# Patient Record
Sex: Female | Born: 1955 | Race: Black or African American | Hispanic: No | Smoking: Current every day smoker
Health system: Southern US, Community
[De-identification: ages and names within clinical notes are randomized; demographics above are authoritative.]

## PROBLEM LIST (undated history)

## (undated) ENCOUNTER — Emergency Department (HOSPITAL_COMMUNITY): Admission: EM | Payer: Self-pay | Source: Home / Self Care

## (undated) DIAGNOSIS — Z87442 Personal history of urinary calculi: Secondary | ICD-10-CM

## (undated) DIAGNOSIS — I82409 Acute embolism and thrombosis of unspecified deep veins of unspecified lower extremity: Secondary | ICD-10-CM

## (undated) DIAGNOSIS — K219 Gastro-esophageal reflux disease without esophagitis: Secondary | ICD-10-CM

## (undated) DIAGNOSIS — E785 Hyperlipidemia, unspecified: Secondary | ICD-10-CM

## (undated) DIAGNOSIS — K5900 Constipation, unspecified: Secondary | ICD-10-CM

## (undated) DIAGNOSIS — I10 Essential (primary) hypertension: Secondary | ICD-10-CM

## (undated) DIAGNOSIS — G8929 Other chronic pain: Secondary | ICD-10-CM

## (undated) DIAGNOSIS — M549 Dorsalgia, unspecified: Secondary | ICD-10-CM

## (undated) DIAGNOSIS — R519 Headache, unspecified: Secondary | ICD-10-CM

## (undated) DIAGNOSIS — J449 Chronic obstructive pulmonary disease, unspecified: Secondary | ICD-10-CM

## (undated) DIAGNOSIS — R51 Headache: Secondary | ICD-10-CM

## (undated) HISTORY — PX: CHOLECYSTECTOMY: SHX55

## (undated) HISTORY — PX: ABDOMINAL HYSTERECTOMY: SHX81

## (undated) HISTORY — PX: VASCULAR SURGERY: SHX849

---

## 2001-06-01 ENCOUNTER — Emergency Department (HOSPITAL_COMMUNITY): Admission: EM | Admit: 2001-06-01 | Discharge: 2001-06-01 | Payer: Self-pay | Admitting: *Deleted

## 2001-09-18 ENCOUNTER — Emergency Department (HOSPITAL_COMMUNITY): Admission: EM | Admit: 2001-09-18 | Discharge: 2001-09-18 | Payer: Self-pay | Admitting: Emergency Medicine

## 2002-01-26 ENCOUNTER — Emergency Department (HOSPITAL_COMMUNITY): Admission: EM | Admit: 2002-01-26 | Discharge: 2002-01-26 | Payer: Self-pay | Admitting: *Deleted

## 2002-03-29 ENCOUNTER — Emergency Department (HOSPITAL_COMMUNITY): Admission: EM | Admit: 2002-03-29 | Discharge: 2002-03-29 | Payer: Self-pay | Admitting: Internal Medicine

## 2003-05-10 ENCOUNTER — Encounter: Payer: Self-pay | Admitting: Emergency Medicine

## 2003-05-10 ENCOUNTER — Emergency Department (HOSPITAL_COMMUNITY): Admission: EM | Admit: 2003-05-10 | Discharge: 2003-05-10 | Payer: Self-pay | Admitting: Emergency Medicine

## 2003-05-29 ENCOUNTER — Ambulatory Visit (HOSPITAL_COMMUNITY): Admission: RE | Admit: 2003-05-29 | Discharge: 2003-05-29 | Payer: Self-pay | Admitting: General Surgery

## 2003-05-29 ENCOUNTER — Encounter: Payer: Self-pay | Admitting: General Surgery

## 2004-03-08 ENCOUNTER — Ambulatory Visit (HOSPITAL_COMMUNITY): Admission: RE | Admit: 2004-03-08 | Discharge: 2004-03-08 | Payer: Self-pay | Admitting: Family Medicine

## 2004-10-15 ENCOUNTER — Emergency Department (HOSPITAL_COMMUNITY): Admission: EM | Admit: 2004-10-15 | Discharge: 2004-10-15 | Payer: Self-pay | Admitting: Emergency Medicine

## 2005-01-31 ENCOUNTER — Emergency Department (HOSPITAL_COMMUNITY): Admission: EM | Admit: 2005-01-31 | Discharge: 2005-01-31 | Payer: Self-pay | Admitting: Emergency Medicine

## 2005-05-07 ENCOUNTER — Emergency Department (HOSPITAL_COMMUNITY): Admission: EM | Admit: 2005-05-07 | Discharge: 2005-05-07 | Payer: Self-pay | Admitting: Emergency Medicine

## 2005-09-13 ENCOUNTER — Emergency Department (HOSPITAL_COMMUNITY): Admission: EM | Admit: 2005-09-13 | Discharge: 2005-09-13 | Payer: Self-pay | Admitting: Emergency Medicine

## 2005-12-09 ENCOUNTER — Observation Stay (HOSPITAL_COMMUNITY): Admission: EM | Admit: 2005-12-09 | Discharge: 2005-12-10 | Payer: Self-pay | Admitting: Emergency Medicine

## 2005-12-18 HISTORY — PX: COLONOSCOPY: SHX174

## 2006-01-17 ENCOUNTER — Emergency Department (HOSPITAL_COMMUNITY): Admission: EM | Admit: 2006-01-17 | Discharge: 2006-01-17 | Payer: Self-pay | Admitting: Emergency Medicine

## 2006-02-05 ENCOUNTER — Emergency Department (HOSPITAL_COMMUNITY): Admission: EM | Admit: 2006-02-05 | Discharge: 2006-02-05 | Payer: Self-pay | Admitting: Emergency Medicine

## 2006-02-14 ENCOUNTER — Emergency Department (HOSPITAL_COMMUNITY): Admission: EM | Admit: 2006-02-14 | Discharge: 2006-02-14 | Payer: Self-pay | Admitting: Emergency Medicine

## 2006-05-15 ENCOUNTER — Emergency Department (HOSPITAL_COMMUNITY): Admission: EM | Admit: 2006-05-15 | Discharge: 2006-05-15 | Payer: Self-pay | Admitting: Emergency Medicine

## 2006-05-22 ENCOUNTER — Emergency Department (HOSPITAL_COMMUNITY): Admission: EM | Admit: 2006-05-22 | Discharge: 2006-05-22 | Payer: Self-pay | Admitting: Emergency Medicine

## 2006-06-14 ENCOUNTER — Emergency Department (HOSPITAL_COMMUNITY): Admission: EM | Admit: 2006-06-14 | Discharge: 2006-06-15 | Payer: Self-pay | Admitting: Emergency Medicine

## 2006-06-20 ENCOUNTER — Emergency Department (HOSPITAL_COMMUNITY): Admission: EM | Admit: 2006-06-20 | Discharge: 2006-06-20 | Payer: Self-pay | Admitting: Emergency Medicine

## 2006-07-26 ENCOUNTER — Encounter (HOSPITAL_COMMUNITY): Admission: RE | Admit: 2006-07-26 | Discharge: 2006-08-25 | Payer: Self-pay | Admitting: Family Medicine

## 2006-08-16 ENCOUNTER — Emergency Department (HOSPITAL_COMMUNITY): Admission: EM | Admit: 2006-08-16 | Discharge: 2006-08-16 | Payer: Self-pay | Admitting: Emergency Medicine

## 2006-09-06 ENCOUNTER — Ambulatory Visit: Payer: Self-pay | Admitting: Internal Medicine

## 2006-09-26 ENCOUNTER — Emergency Department (HOSPITAL_COMMUNITY): Admission: EM | Admit: 2006-09-26 | Discharge: 2006-09-26 | Payer: Self-pay | Admitting: Emergency Medicine

## 2006-10-18 ENCOUNTER — Ambulatory Visit: Payer: Self-pay | Admitting: Internal Medicine

## 2006-10-29 ENCOUNTER — Ambulatory Visit (HOSPITAL_COMMUNITY): Admission: RE | Admit: 2006-10-29 | Discharge: 2006-10-29 | Payer: Self-pay | Admitting: Internal Medicine

## 2006-10-29 ENCOUNTER — Ambulatory Visit: Payer: Self-pay | Admitting: Internal Medicine

## 2007-01-27 ENCOUNTER — Emergency Department (HOSPITAL_COMMUNITY): Admission: EM | Admit: 2007-01-27 | Discharge: 2007-01-27 | Payer: Self-pay | Admitting: Emergency Medicine

## 2007-03-28 ENCOUNTER — Emergency Department (HOSPITAL_COMMUNITY): Admission: EM | Admit: 2007-03-28 | Discharge: 2007-03-28 | Payer: Self-pay | Admitting: Emergency Medicine

## 2007-04-19 ENCOUNTER — Ambulatory Visit (HOSPITAL_COMMUNITY): Admission: RE | Admit: 2007-04-19 | Discharge: 2007-04-19 | Payer: Self-pay | Admitting: Family Medicine

## 2007-07-13 ENCOUNTER — Emergency Department (HOSPITAL_COMMUNITY): Admission: EM | Admit: 2007-07-13 | Discharge: 2007-07-13 | Payer: Self-pay | Admitting: Emergency Medicine

## 2007-09-02 ENCOUNTER — Emergency Department (HOSPITAL_COMMUNITY): Admission: EM | Admit: 2007-09-02 | Discharge: 2007-09-02 | Payer: Self-pay | Admitting: Emergency Medicine

## 2007-11-04 ENCOUNTER — Emergency Department (HOSPITAL_COMMUNITY): Admission: EM | Admit: 2007-11-04 | Discharge: 2007-11-04 | Payer: Self-pay | Admitting: Emergency Medicine

## 2008-01-05 ENCOUNTER — Emergency Department (HOSPITAL_COMMUNITY): Admission: EM | Admit: 2008-01-05 | Discharge: 2008-01-05 | Payer: Self-pay | Admitting: Emergency Medicine

## 2008-06-23 ENCOUNTER — Emergency Department (HOSPITAL_COMMUNITY): Admission: EM | Admit: 2008-06-23 | Discharge: 2008-06-23 | Payer: Self-pay | Admitting: Emergency Medicine

## 2008-09-30 ENCOUNTER — Emergency Department (HOSPITAL_COMMUNITY): Admission: EM | Admit: 2008-09-30 | Discharge: 2008-09-30 | Payer: Self-pay | Admitting: Emergency Medicine

## 2008-12-13 ENCOUNTER — Emergency Department (HOSPITAL_COMMUNITY): Admission: EM | Admit: 2008-12-13 | Discharge: 2008-12-13 | Payer: Self-pay | Admitting: Emergency Medicine

## 2009-01-24 ENCOUNTER — Emergency Department (HOSPITAL_COMMUNITY): Admission: EM | Admit: 2009-01-24 | Discharge: 2009-01-24 | Payer: Self-pay | Admitting: Emergency Medicine

## 2009-03-09 ENCOUNTER — Ambulatory Visit (HOSPITAL_COMMUNITY): Admission: RE | Admit: 2009-03-09 | Discharge: 2009-03-09 | Payer: Self-pay | Admitting: Family Medicine

## 2009-03-16 ENCOUNTER — Emergency Department (HOSPITAL_COMMUNITY): Admission: EM | Admit: 2009-03-16 | Discharge: 2009-03-16 | Payer: Self-pay | Admitting: Emergency Medicine

## 2009-07-04 ENCOUNTER — Emergency Department (HOSPITAL_COMMUNITY): Admission: EM | Admit: 2009-07-04 | Discharge: 2009-07-04 | Payer: Self-pay | Admitting: Emergency Medicine

## 2009-09-11 ENCOUNTER — Emergency Department (HOSPITAL_COMMUNITY): Admission: EM | Admit: 2009-09-11 | Discharge: 2009-09-11 | Payer: Self-pay | Admitting: Emergency Medicine

## 2010-04-24 ENCOUNTER — Emergency Department (HOSPITAL_COMMUNITY): Admission: EM | Admit: 2010-04-24 | Discharge: 2010-04-24 | Payer: Self-pay | Admitting: Emergency Medicine

## 2010-10-31 ENCOUNTER — Emergency Department (HOSPITAL_COMMUNITY)
Admission: EM | Admit: 2010-10-31 | Discharge: 2010-10-31 | Payer: Self-pay | Source: Home / Self Care | Admitting: Emergency Medicine

## 2011-01-07 ENCOUNTER — Encounter: Payer: Self-pay | Admitting: Family Medicine

## 2011-01-19 ENCOUNTER — Emergency Department (HOSPITAL_COMMUNITY)
Admission: EM | Admit: 2011-01-19 | Discharge: 2011-01-19 | Disposition: A | Discharge status: Home / Self Care | Payer: Medicare Other | Attending: Emergency Medicine | Admitting: Emergency Medicine

## 2011-01-19 DIAGNOSIS — N390 Urinary tract infection, site not specified: Secondary | ICD-10-CM | POA: Insufficient documentation

## 2011-01-19 DIAGNOSIS — R112 Nausea with vomiting, unspecified: Secondary | ICD-10-CM | POA: Insufficient documentation

## 2011-01-19 LAB — URINALYSIS, ROUTINE W REFLEX MICROSCOPIC
Bilirubin Urine: NEGATIVE
Hgb urine dipstick: NEGATIVE
Protein, ur: NEGATIVE mg/dL
Specific Gravity, Urine: 1.025 (ref 1.005–1.030)
Urine Glucose, Fasting: NEGATIVE mg/dL
Urobilinogen, UA: 0.2 mg/dL (ref 0.0–1.0)

## 2011-01-19 LAB — BASIC METABOLIC PANEL
Calcium: 9.3 mg/dL (ref 8.4–10.5)
Chloride: 106 mEq/L (ref 96–112)
GFR calc Af Amer: >60 mL/min (ref >60)
Potassium: 3.7 mEq/L (ref 3.5–5.1)
Sodium: 140 mEq/L (ref 135–145)

## 2011-01-19 LAB — DIFFERENTIAL
Basophils Absolute: 0.1 10*3/uL (ref 0.0–0.1)
Basophils Relative: 1 % (ref 0–1)
Eosinophils Absolute: 0.1 10*3/uL (ref 0.0–0.7)
Lymphocytes Relative: 50 % — ABNORMAL HIGH (ref 12–46)
Lymphs Abs: 3.4 10*3/uL (ref 0.7–4.0)
Monocytes Absolute: 0.6 10*3/uL (ref 0.1–1.0)
Neutro Abs: 2.7 10*3/uL (ref 1.7–7.7)

## 2011-01-19 LAB — URINE MICROSCOPIC-ADD ON

## 2011-01-19 LAB — CBC
Hemoglobin: 14.7 g/dL (ref 12.0–15.0)
MCHC: 33.7 g/dL (ref 30.0–36.0)
Platelets: 260 10*3/uL (ref 150–400)
RDW: 13.7 % (ref 11.5–15.5)

## 2011-02-09 ENCOUNTER — Emergency Department (HOSPITAL_COMMUNITY)
Admission: EM | Admit: 2011-02-09 | Discharge: 2011-02-09 | Disposition: A | Discharge status: Home / Self Care | Payer: Medicare Other | Attending: Emergency Medicine | Admitting: Emergency Medicine

## 2011-02-09 ENCOUNTER — Emergency Department (HOSPITAL_COMMUNITY): Payer: Medicare Other

## 2011-02-09 DIAGNOSIS — R509 Fever, unspecified: Secondary | ICD-10-CM | POA: Insufficient documentation

## 2011-02-09 DIAGNOSIS — R0602 Shortness of breath: Secondary | ICD-10-CM | POA: Insufficient documentation

## 2011-02-09 DIAGNOSIS — J40 Bronchitis, not specified as acute or chronic: Secondary | ICD-10-CM | POA: Insufficient documentation

## 2011-02-09 LAB — COMPREHENSIVE METABOLIC PANEL
ALT: 21 U/L (ref 0–35)
AST: 22 U/L (ref 0–37)
Albumin: 3.6 g/dL (ref 3.5–5.2)
CO2: 24 mEq/L (ref 19–32)
Calcium: 9.3 mg/dL (ref 8.4–10.5)
Creatinine, Ser: 1.06 mg/dL (ref 0.4–1.2)
GFR calc Af Amer: >60 mL/min (ref >60)
GFR calc non Af Amer: 54 mL/min — ABNORMAL LOW (ref >60)
Sodium: 139 mEq/L (ref 135–145)
Total Protein: 6.4 g/dL (ref 6.0–8.3)

## 2011-02-09 LAB — DIFFERENTIAL
Basophils Absolute: 0 10*3/uL (ref 0.0–0.1)
Basophils Relative: 0 % (ref 0–1)
Eosinophils Relative: 0 % (ref 0–5)
Lymphocytes Relative: 6 % — ABNORMAL LOW (ref 12–46)
Neutro Abs: 12.5 10*3/uL — ABNORMAL HIGH (ref 1.7–7.7)

## 2011-02-09 LAB — CBC
HCT: 42.8 % (ref 36.0–46.0)
MCHC: 32.7 g/dL (ref 30.0–36.0)
RDW: 13.8 % (ref 11.5–15.5)
WBC: 13.9 10*3/uL — ABNORMAL HIGH (ref 4.0–10.5)

## 2011-02-09 LAB — D-DIMER, QUANTITATIVE: D-Dimer, Quant: 0.23 ug/mL-FEU (ref 0.00–0.48)

## 2011-02-15 ENCOUNTER — Inpatient Hospital Stay (HOSPITAL_COMMUNITY): Payer: Medicare Other

## 2011-02-15 ENCOUNTER — Inpatient Hospital Stay (HOSPITAL_COMMUNITY)
Admission: RE | Admit: 2011-02-15 | Discharge: 2011-02-18 | DRG: 195 | Disposition: A | Discharge status: Home / Self Care | Payer: Medicare Other | Source: Ambulatory Visit | Attending: Internal Medicine | Admitting: Internal Medicine

## 2011-02-15 ENCOUNTER — Encounter (HOSPITAL_COMMUNITY): Payer: Self-pay | Admitting: Radiology

## 2011-02-15 ENCOUNTER — Emergency Department (HOSPITAL_COMMUNITY): Payer: Medicare Other

## 2011-02-15 DIAGNOSIS — R071 Chest pain on breathing: Secondary | ICD-10-CM | POA: Diagnosis present

## 2011-02-15 DIAGNOSIS — R918 Other nonspecific abnormal finding of lung field: Secondary | ICD-10-CM | POA: Diagnosis present

## 2011-02-15 DIAGNOSIS — E876 Hypokalemia: Secondary | ICD-10-CM | POA: Diagnosis present

## 2011-02-15 DIAGNOSIS — J189 Pneumonia, unspecified organism: Principal | ICD-10-CM | POA: Diagnosis present

## 2011-02-15 DIAGNOSIS — D72829 Elevated white blood cell count, unspecified: Secondary | ICD-10-CM | POA: Diagnosis present

## 2011-02-15 DIAGNOSIS — F17200 Nicotine dependence, unspecified, uncomplicated: Secondary | ICD-10-CM | POA: Diagnosis present

## 2011-02-15 LAB — DIFFERENTIAL
Eosinophils Absolute: 0.1 10*3/uL (ref 0.0–0.7)
Eosinophils Relative: 0 % (ref 0–5)
Lymphocytes Relative: 18 % (ref 12–46)
Lymphs Abs: 2.4 10*3/uL (ref 0.7–4.0)
Monocytes Absolute: 0.2 10*3/uL (ref 0.1–1.0)
Monocytes Relative: 2 % — ABNORMAL LOW (ref 3–12)

## 2011-02-15 LAB — BASIC METABOLIC PANEL
BUN: 7 mg/dL (ref 6–23)
CO2: 26 mEq/L (ref 19–32)
Chloride: 108 mEq/L (ref 96–112)
Potassium: 3.3 mEq/L — ABNORMAL LOW (ref 3.5–5.1)

## 2011-02-15 LAB — BLOOD GAS, ARTERIAL
Acid-base deficit: 2.3 mmol/L — ABNORMAL HIGH (ref 0.0–2.0)
Bicarbonate: 21.1 mEq/L (ref 20.0–24.0)
Patient temperature: 37
TCO2: 18.5 mmol/L (ref 0–100)
pH, Arterial: 7.448 — ABNORMAL HIGH (ref 7.350–7.400)

## 2011-02-15 LAB — HEPATIC FUNCTION PANEL
ALT: 15 U/L (ref 0–35)
Albumin: 3.4 g/dL — ABNORMAL LOW (ref 3.5–5.2)
Alkaline Phosphatase: 80 U/L (ref 39–117)
Indirect Bilirubin: 0.3 mg/dL (ref 0.3–0.9)
Total Bilirubin: 0.4 mg/dL (ref 0.3–1.2)
Total Protein: 6.4 g/dL (ref 6.0–8.3)

## 2011-02-15 LAB — CBC
HCT: 43.6 % (ref 36.0–46.0)
MCH: 28.3 pg (ref 26.0–34.0)
MCHC: 32.6 g/dL (ref 30.0–36.0)
MCV: 86.9 fL (ref 78.0–100.0)
Platelets: 255 10*3/uL (ref 150–400)
RDW: 13.9 % (ref 11.5–15.5)
WBC: 13.3 10*3/uL — ABNORMAL HIGH (ref 4.0–10.5)

## 2011-02-15 LAB — CARDIAC PANEL(CRET KIN+CKTOT+MB+TROPI)
CK, MB: 0.8 ng/mL (ref 0.3–4.0)
CK, MB: 0.6 ng/mL (ref 0.3–4.0)
Relative Index: INVALID (ref 0.0–2.5)
Total CK: 62 U/L (ref 7–177)

## 2011-02-15 LAB — BRAIN NATRIURETIC PEPTIDE: Pro B Natriuretic peptide (BNP): <30 pg/mL (ref 0.0–100.0)

## 2011-02-15 MED ORDER — IOHEXOL 350 MG/ML SOLN
100.0000 mL | Freq: Once | INTRAVENOUS | Status: AC | PRN
  Administered 2011-02-15: 100 mL via INTRAVENOUS

## 2011-02-16 LAB — CBC
HCT: 34.1 % — ABNORMAL LOW (ref 36.0–46.0)
Hemoglobin: 11.1 g/dL — ABNORMAL LOW (ref 12.0–15.0)
MCH: 28.7 pg (ref 26.0–34.0)
MCV: 88.1 fL (ref 78.0–100.0)
RBC: 3.87 MIL/uL (ref 3.87–5.11)

## 2011-02-16 LAB — DIFFERENTIAL
Lymphocytes Relative: 8 % — ABNORMAL LOW (ref 12–46)
Lymphs Abs: 1.6 10*3/uL (ref 0.7–4.0)
Monocytes Absolute: 0.7 10*3/uL (ref 0.1–1.0)
Monocytes Relative: 4 % (ref 3–12)
Neutro Abs: 17.9 10*3/uL — ABNORMAL HIGH (ref 1.7–7.7)
Neutrophils Relative %: 87 % — ABNORMAL HIGH (ref 43–77)

## 2011-02-16 LAB — VANCOMYCIN, TROUGH: Vancomycin Tr: 10.1 ug/mL (ref 10.0–20.0)

## 2011-02-16 LAB — COMPREHENSIVE METABOLIC PANEL
Albumin: 2.9 g/dL — ABNORMAL LOW (ref 3.5–5.2)
Alkaline Phosphatase: 71 U/L (ref 39–117)
BUN: 8 mg/dL (ref 6–23)
CO2: 23 mEq/L (ref 19–32)
Chloride: 111 mEq/L (ref 96–112)
GFR calc non Af Amer: >60 mL/min (ref >60)
Glucose, Bld: 132 mg/dL — ABNORMAL HIGH (ref 70–99)
Potassium: 4.6 mEq/L (ref 3.5–5.1)
Total Bilirubin: 0.4 mg/dL (ref 0.3–1.2)

## 2011-02-17 LAB — BASIC METABOLIC PANEL
CO2: 25 mEq/L (ref 19–32)
Chloride: 110 mEq/L (ref 96–112)
GFR calc Af Amer: >60 mL/min (ref >60)
Glucose, Bld: 104 mg/dL — ABNORMAL HIGH (ref 70–99)
Potassium: 4.2 mEq/L (ref 3.5–5.1)
Sodium: 142 mEq/L (ref 135–145)

## 2011-02-17 LAB — CBC
MCH: 28.2 pg (ref 26.0–34.0)
MCHC: 31.9 g/dL (ref 30.0–36.0)
Platelets: 255 10*3/uL (ref 150–400)
RBC: 4.4 MIL/uL (ref 3.87–5.11)

## 2011-02-17 LAB — DIFFERENTIAL
Basophils Relative: 0 % (ref 0–1)
Eosinophils Absolute: 1.5 10*3/uL — ABNORMAL HIGH (ref 0.0–0.7)
Monocytes Relative: 5 % (ref 3–12)
Neutrophils Relative %: 62 % (ref 43–77)

## 2011-02-18 LAB — DIFFERENTIAL
Basophils Absolute: 0 10*3/uL (ref 0.0–0.1)
Lymphocytes Relative: 31 % (ref 12–46)
Monocytes Absolute: 0.5 10*3/uL (ref 0.1–1.0)
Monocytes Relative: 5 % (ref 3–12)
Neutro Abs: 3.9 10*3/uL (ref 1.7–7.7)
Neutrophils Relative %: 41 % — ABNORMAL LOW (ref 43–77)

## 2011-02-18 LAB — CBC
HCT: 42.7 % (ref 36.0–46.0)
Hemoglobin: 13.5 g/dL (ref 12.0–15.0)
MCH: 27.4 pg (ref 26.0–34.0)
MCHC: 31.6 g/dL (ref 30.0–36.0)
RBC: 4.93 MIL/uL (ref 3.87–5.11)

## 2011-02-28 NOTE — Discharge Summary (Signed)
[place], [place] Washington             ACCOUNT NO.:  [id number]  MEDICAL RECORD NO.:  [id number]           PATIENT TYPE:  I  LOCATION:  A327                          FACILITY:  [facility]  PHYSICIAN:  Hartley Barefoot, MD    DATE OF BIRTH:  [birth year]  DATE OF ADMISSION:  02/15/2011 DATE OF DISCHARGE:  03/03/2012LH                              DISCHARGE SUMMARY   DISCHARGE DIAGNOSES: 1. Community-acquired pneumonia. 2. Pleuritic chest pain secondary to pneumonia. 3. Tobacco abuse. 4. A small nodular foci in the right lung, the largest of which     measure 6 mm.  DISCHARGE MEDICATIONS: 1. Albuterol 90 mcg every 6 hours as needed. 2. Guaifenesin 600 mg twice daily as needed. 3. Levofloxacin 750 mg p.o. daily. 4. Percocet, oxycodone, Tylenol 1-2 tablets every 6 hours as needed. 5. Promethazine 25 mg half tablet by mouth every 4 hours as needed.  Medications that stopped during this hospitalization, azithromycin and nitrofurantoin.  DISPOSITION AND FOLLOWUP:  Virginia Washington will need to follow with her primary care physician Dr. Milford Cage and during that appointment she will need to be refer for a pulmonary function test.  She will need a repeat CT chest within 3 months to follow lung nodules. She will need a CBC to follow white blood cell and differential.  RADIOGRAPHIC STUDY: 1. Chest x-ray showed patchy left basilar airspace opacity, concerning     for pneumonia. 2. CT angio showed no evidence of pulmonary embolism.  Bibasilar     atelectasis.  A small nodular foci identified in the right lung,     largest of which measured 6 mm in diameter.  These nodules are     poorly defined and may represent nonsolid opacity, potentially     infection but cannot exclude tumor.  Recommend short-term followup     of chest in 3 months to reassess.  BRIEF HISTORY OF PRESENT ILLNESS:  This is a very pleasant 55 year old with no significant past medical history who presented complaining of chest pain,  shortness of breath, productive cough, subjective fever. The patient presented on February 23, 5 days prior to admission with similar complaint and was discharged on Zithromax.  The patient despite taking antibiotics was still having the same symptoms.  The chest pain is worse with breathing.  Denies abdominal pain, dysuria, diarrhea.  HOSPITAL COURSE: 1. Community-acquired pneumonia that failed outpatient therapy.  The     patient was started on vancomycin and Levaquin.  The patient was on     vancomycin for 24-48 hours.  After that she was continued on     Levaquin and she was observed on oral antibiotics and her white     blood cell was trending down.  Initially, she received a dose of     Solu-Medrol and her white blood cell increased from 13-20 but then     after the course of hospitalization, decreased to 14 and then to     9.5 on the day of discharge.  The patient's shortness of breath and     chest pain has resolved.  Chest pain was likely secondary to  pneumonia.  CT angio was negative for PE.  The patient will be     discharged on levofloxacin for 4 more days.  She will be discharged     on albuterol.  She will benefit from pulmonary function test.  She     has history of current smoking and risk for COPD.  On the day of     discharge, the patient was feeling better.  No chest pain or     shortness of breath. 2. Lung nodule on CT.  The patient will need a CT in 3 months to rule     out malignancy. 3. Tobacco abuse.  The patient was counseled regarding tobacco     cessation. 4. On the day of discharge the patient was in improved condition.  No     chest pain, shortness of breath, sat 98 on room air, blood pressure     100/65, respirations 18, pulse 68, temperature 98.2.  DISCHARGE LABORATORY FINDINGS:  White blood cell 9.5, hemoglobin 13, hematocrit 42, platelets 311.  Sodium the day prior to discharge 142, potassium 4.2, chloride 110, CO2 25, glucose 104, BUN 8,  creatinine 0.95, calcium 8.9.  The patient was discharged in improved condition.     Hartley Barefoot, MD     BR/MEDQ  D:  02/18/2011  T:  02/19/2011  Job:  [id number]  cc:   Dr. Milford Cage  Electronically Signed by Hartley Barefoot MD on 02/28/2011 08:29:08 AM

## 2011-03-20 NOTE — H&P (Signed)
[place], [place] Washington             ACCOUNT NO.:  [id number]  MEDICAL RECORD NO.:  [id number]           PATIENT TYPE:  E  LOCATION:  APED                          FACILITY:  [facility]  PHYSICIAN:  Eduard Clos, MDDATE OF BIRTH:  1956-06-23  DATE OF ADMISSION:  02/15/2011 DATE OF DISCHARGE:  LH                             HISTORY & PHYSICAL   PRIMARY CARE PHYSICIAN:  Unassigned.  CHIEF COMPLAINT:  Chest pain and shortness of breath.  HISTORY OF PRESENT ILLNESS:  A 55 year old female with no significant past medical history presented with complaints of chest pain, shortness of breath, cough with productive sputum, subjective feeling of fever and chills.  The patient did come on February 23, 5 days ago with similar complaints and the patient was given Zithromax and discharged home. Despite taking which, the patient still having similar symptoms.  It is not known if the patient completed the current medications.  The patient states it is only the right side and only when she takes a deep breath. Denies any nausea or vomiting.  Denies any exertional symptoms.  Denies any abdominal pain, dysuria, discharge, or diarrhea.  Denies any focal deficit, headache, or visual symptoms.  Denies any dizziness or loss of consciousness.  PAST MEDICAL HISTORY:  Nothing significant.  PAST SURGICAL HISTORY:  Hysterectomy and appendectomy.  MEDICATIONS PRIOR TO ADMISSION:  Zithromax.  ALLERGIES:  No known drug allergies.  FAMILY HISTORY:  Negative for any diabetes, hypertension, stroke.  SOCIAL HISTORY:  The patient smokes cigarettes, has been advised to quit smoking.  Denies any alcohol or drug abuse.  REVIEW OF SYSTEMS:  As per history of present illness, nothing else significant.  PHYSICAL EXAMINATION:  GENERAL:  The patient examined at bedside, appears mildly short of breath. VITAL SIGNS:  Blood pressure 105/81, pulse 90 per minute, temperature 98.7, respirations 20-24 per minute, and  O2 saturation is 96%. HEENT:  Anicteric.  No pallor.  No discharge from ears, eyes, nose, or mouth. CHEST:  Bilateral air entry present.  No rhonchi.  No crepitation. HEART:  S1 and S2 heard. ABDOMEN:  Soft and nontender.  Bowel sounds heard. CNS:  The patient is alert, awake, and oriented to time, place, and person.  Moves upper and lower extremities 5/5: EXTREMITIES:  Peripheral pulses felt.  No edema.  LABORATORY DATA:  EKG has been ordered.  Chest x-ray shows patchy left basilar airspace opacities concerning for pneumonia.  CBC:  WBC 13.3, hemoglobin is 14.2, hematocrit 43.6, platelets 255, neutrophils 80%.  Basic metabolic panel:  Sodium 141, potassium 3.6, chloride 108, carbon dioxide 26, glucose 141, BUN 7, creatinine 1, and calcium 9.3.  ASSESSMENT: 1. Pneumonia, community-acquired. 2. Pleuritic type of chest pain. 3. Tobacco abuse.  PLAN: 1. At this time, admit the patient to telemetry. 2. For pneumonia, the patient has failed outpatient antibiotics.  At     this time, we will place the patient on vancomycin and Levaquin.     Also concerned at this time as the patient has a history of     cigarette smoking, we will get a CT of chest angio protocol. 3. The patient will  need tobacco abuse counseling. 4. Further recommendation based on test orders.     Eduard Clos, MD     ANK/MEDQ  D:  02/15/2011  T:  02/15/2011  Job:  [id number]  Electronically Signed by Midge Minium MD on 03/20/2011 07:21:12 AM

## 2011-03-24 LAB — URINALYSIS, ROUTINE W REFLEX MICROSCOPIC
Glucose, UA: NEGATIVE mg/dL
Hgb urine dipstick: NEGATIVE
Protein, ur: NEGATIVE mg/dL
pH: 7 (ref 5.0–8.0)

## 2011-03-24 LAB — URINE MICROSCOPIC-ADD ON

## 2011-03-26 LAB — DIFFERENTIAL
Basophils Absolute: 0 10*3/uL (ref 0.0–0.1)
Basophils Relative: 0 % (ref 0–1)
Eosinophils Absolute: 0 10*3/uL (ref 0.0–0.7)
Eosinophils Relative: 0 % (ref 0–5)
Monocytes Absolute: 0.5 10*3/uL (ref 0.1–1.0)
Monocytes Relative: 5 % (ref 3–12)
Neutro Abs: 8.2 10*3/uL — ABNORMAL HIGH (ref 1.7–7.7)

## 2011-03-26 LAB — COMPREHENSIVE METABOLIC PANEL
ALT: 11 U/L (ref 0–35)
AST: 20 U/L (ref 0–37)
Albumin: 3.9 g/dL (ref 3.5–5.2)
Alkaline Phosphatase: 89 U/L (ref 39–117)
BUN: 8 mg/dL (ref 6–23)
Chloride: 108 mEq/L (ref 96–112)
GFR calc Af Amer: >60 mL/min (ref >60)
Potassium: 3.5 mEq/L (ref 3.5–5.1)
Sodium: 140 mEq/L (ref 135–145)
Total Bilirubin: 0.5 mg/dL (ref 0.3–1.2)
Total Protein: 6.9 g/dL (ref 6.0–8.3)

## 2011-03-26 LAB — URINALYSIS, ROUTINE W REFLEX MICROSCOPIC
Bilirubin Urine: NEGATIVE
Hgb urine dipstick: NEGATIVE
Specific Gravity, Urine: <1.005 — ABNORMAL LOW (ref 1.005–1.030)
Urobilinogen, UA: 0.2 mg/dL (ref 0.0–1.0)
pH: 6 (ref 5.0–8.0)

## 2011-03-26 LAB — CBC
HCT: 42 % (ref 36.0–46.0)
Platelets: 204 10*3/uL (ref 150–400)
RDW: 13.9 % (ref 11.5–15.5)
WBC: 10.1 10*3/uL (ref 4.0–10.5)

## 2011-03-26 LAB — RAPID URINE DRUG SCREEN, HOSP PERFORMED
Amphetamines: NOT DETECTED
Barbiturates: NOT DETECTED
Cocaine: NOT DETECTED
Opiates: NOT DETECTED

## 2011-03-26 LAB — POCT CARDIAC MARKERS: CKMB, poc: <1 ng/mL — ABNORMAL LOW (ref 1.0–8.0)

## 2011-04-04 LAB — BASIC METABOLIC PANEL
BUN: 10 mg/dL (ref 6–23)
Chloride: 106 mEq/L (ref 96–112)
Creatinine, Ser: 0.95 mg/dL (ref 0.4–1.2)
Glucose, Bld: 102 mg/dL — ABNORMAL HIGH (ref 70–99)
Potassium: 3.7 mEq/L (ref 3.5–5.1)

## 2011-04-04 LAB — URINALYSIS, ROUTINE W REFLEX MICROSCOPIC
Ketones, ur: NEGATIVE mg/dL
Nitrite: NEGATIVE
Protein, ur: NEGATIVE mg/dL
Urobilinogen, UA: 0.2 mg/dL (ref 0.0–1.0)

## 2011-04-04 LAB — DIFFERENTIAL
Basophils Absolute: 0 10*3/uL (ref 0.0–0.1)
Basophils Relative: 0 % (ref 0–1)
Eosinophils Absolute: 0 10*3/uL (ref 0.0–0.7)
Eosinophils Relative: 0 % (ref 0–5)
Neutrophils Relative %: 86 % — ABNORMAL HIGH (ref 43–77)

## 2011-04-04 LAB — CBC
HCT: 44.4 % (ref 36.0–46.0)
MCV: 86.3 fL (ref 78.0–100.0)
Platelets: 214 10*3/uL (ref 150–400)
RDW: 13.6 % (ref 11.5–15.5)

## 2011-05-05 ENCOUNTER — Emergency Department (HOSPITAL_COMMUNITY): Payer: Medicare Other

## 2011-05-05 ENCOUNTER — Emergency Department (HOSPITAL_COMMUNITY)
Admission: EM | Admit: 2011-05-05 | Discharge: 2011-05-05 | Disposition: A | Discharge status: Home / Self Care | Payer: Medicare Other | Attending: Emergency Medicine | Admitting: Emergency Medicine

## 2011-05-05 DIAGNOSIS — W108XXA Fall (on) (from) other stairs and steps, initial encounter: Secondary | ICD-10-CM | POA: Insufficient documentation

## 2011-05-05 DIAGNOSIS — S93409A Sprain of unspecified ligament of unspecified ankle, initial encounter: Secondary | ICD-10-CM | POA: Insufficient documentation

## 2011-05-05 NOTE — Consult Note (Signed)
[place], [place] Washington             ACCOUNT NO.:  [id number]   MEDICAL RECORD NO.:  [id number]          PATIENT TYPE:  AMB   LOCATION:                                FACILITY:  [facility]   PHYSICIAN:  Lionel December, M.D.    DATE OF BIRTH:  08-16-1956   DATE OF CONSULTATION:  10/18/2006  DATE OF DISCHARGE:                                   CONSULTATION   REASON FOR CONSULTATION:  Hematochezia referred for colonoscopy.   HISTORY OF PRESENT ILLNESS:  Virginia Washington is a 55 year old African American  female who is referred through courtesy of Jeoffrey Massed, M.D. for GI  evaluation.  About 2 months ago she had an episode of lower abdominal pain  and hematochezia.  She believes she was constipated.  She was seen in  emergency room and her CBC was normal.  She followed up with Dr. Milinda Cave.  She was started on stool softener.  She is now having normal stools.  She  was advised colonoscopy.  She has not experienced any more episodes of  constipation or abdominal pain.  She has a good appetite.  She denies  nausea, vomiting, heartburn or dysphagia.  She states she has gained some  weight this year which she blames on hormonal pill.   PRESENT MEDS:  1. Hormone pill daily specifics unknown.  2. Colace 2 tablets bedtime.   PAST MEDICAL HISTORY:  She was evaluated for weight loss and diarrhea back  in March 2001.  Her sigmoidoscopy was normal.  She had three Hemoccults  which were all negative.  We felt she had IBS. Her CBC and sed rate were  normal.   At that time she weighed 107 pounds.   PRIOR SURGERIES:  Include cholecystectomy and hysterectomy possibly 1997 or  98.   Remote history of peptic ulcer disease, H pylori status is unknown.   ALLERGIES:  No known drug allergies.   FAMILY HISTORY:  Father died of unknown type of malignancy at age 63.  She  has a mother age 66 with breathing problems.  One sister died of auto  accident at age 52.  She has seven sisters and four brothers living  in  good health.   SOCIAL HISTORY:  She is a widow.  Husband died a few years ago of auto  accident.  She has two children living.  One son was short and died at age  61.  She is on disability because of learning disabilities. She has been  smoking half to one pack a day for 30 years presently smoking half a pack.  She does not drink alcohol.   PHYSICAL EXAM:  GENERAL:  Pleasant well-developed, well-nourished of her  Afro American female who is in no acute distress.  VITAL SIGNS:  She weighs 130 pounds.  She is 5 feet 1 inch tall.  Pulse 60  per minute, blood pressure 124/70, temperature is 98.6.  HEENT:  Conjunctivae is pink.  Sclerae is nonicteric.  Oropharynx mucosa is  normal.  She has one tooth in lower jaw.  She states she has dentures but  there do  not fit well and she does not use them.  NECK:  No neck masses or thyromegaly noted.  HEART:  Regular rhythm.  Normal S1-S3.  No murmur or gallop noted.  LUNGS:  Clear to auscultation.  ABDOMEN:  Symmetrical and soft without tenderness, organomegaly or masses.  RECTAL:  Examination reveals formed stool in the vault which is guaiac-  negative.  No peripheral edema or clubbing noted.   ASSESSMENT:  Virginia Washington is a 55 year old Afro American female who had single  episode of hematochezia associated with lower abdominal pain but in the  setting of constipation.  Therefore it is possible that she had hematochezia  secondary to benign disease but given her age (she is approaching 75s) need  to rule out colonic neoplasm.   RECOMMENDATIONS:  She will continue Colace and high fiber diet.  Colonoscopy  to be performed at [facility] in the near future.  I have reviewed the procedure  risks with the patient and she is agreeable.   ADDENDUM:  Records through Dr. Samul Dada office dated August 18, 2006, she  had negative Clostridium difficile, negative culture with fecal occult blood  was positive.   We would like to thank Dr. Milinda Cave for the  opportunity to participate in the  care of this nice lady.      Lionel December, M.D.  Electronically Signed     NR/MEDQ  D:  10/18/2006  T:  10/19/2006  Job:  [id number]   cc:   Jeoffrey Massed, MD  Fax: [phone]

## 2011-05-05 NOTE — H&P (Signed)
NAMEANNALIA, Virginia Washington             ACCOUNT NO.:  [id number]   MEDICAL RECORD NO.:  [id number]          PATIENT TYPE:  OBV   LOCATION:  A228                          FACILITY:  [facility]   PHYSICIAN:  Osvaldo Shipper, MD     DATE OF BIRTH:  April 12, 1956   DATE OF ADMISSION:  12/09/2005  DATE OF DISCHARGE:  LH                                HISTORY & PHYSICAL   PRIMARY CARE PHYSICIAN:  Dr. Elpidio Anis.   ADMISSION DIAGNOSES:  1.  Chest pain.  2.  Tobacco use.   CHIEF COMPLAINT:  Left-sided chest pain for one day.   HISTORY OF PRESENT ILLNESS:  The patient is a 55 year old African-American  female who is status post hysterectomy about six to seven years ago who  smokes half a pack of cigarettes a day and otherwise does not have any other  medical problems and presented to the ED with left-sided chest pain.  The  pain started yesterday evening when the patient was resting.  The pain was  sharp in character and about 8/10 in intensity.  The pain was on and off.  There was no shortness of breath or palpitations.  Initially, the patient  felt cold.  Subsequently, she felt hot and flushed.  There was some nausea  associated with the pain but no emesis.  No history of cough, fever, or  chills.  No history of any sick contacts recently.   The patient has very occasional heartburn.  Initially when the pain started,  it did remind her of heartburn.  However, with her usual heartburn, she  takes Alka-Seltzer and gets relief.  This time, she did not get any relief.  The pain did not increase with activity.  This morning when she woke up, she  still had the pain and decided to come in to the ED.  In the ED, the patient  was given one sublingual nitroglycerin, with which the patient has complete  relief of her pain.  Currently, the pain is 0/10.   The patient also mentioned she has never had this kind of pain in the past.   MEDICATIONS AT HOME:  The patient takes hormone replacement pills.  She  does  not know the name of this medication.   ALLERGIES:  No known drug allergies.   PAST MEDICAL HISTORY:  Really unremarkable except surgical history consists  of hysterectomy about six to seven years ago for bleeding done by Dr. Katrinka Blazing  and history of cholecystectomy done about the same time.  No history of any  coronary artery disease, diabetes, hypertension, dyslipidemia, or strokes in  the past.   SOCIAL HISTORY:  The patient lives in [place] and lives alone.  She is a  widow.  She has two sons and one daughter.  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  She smokes about a half a pack of cigarettes per  day.  She has about a 15-pack-year history of smoking.  No history of  illicit drug use.  No alcohol use.   FAMILY HISTORY:  Father died at the age of 31 of unknown  cause.  She thinks  it may have been a stroke, but she is not sure.  Mother is alive with asthma  and had a stroke as well.  No history of heart disease in any of the other  family members.  No history of cancers, no history of any clot formation.   REVIEW OF SYSTEMS:  A 10-point review of systems was done which is positive  for constipation but no weight changes.  No history of any leg swelling as  well.   PHYSICAL EXAMINATION:  VITAL SIGNS:  The patient is afebrile.  Blood  pressure is 123/80, heart rate in the 60s, respiratory rate 14, saturating  99 to 100%.  Currently, she is on 2 L nasal cannula.  GENERAL EXAM:  This is a well-developed, well-nourished individual in no  apparent distress.  HEENT:  There is no pattern of icterus.  Oral and mucous membranes moist.  NECK:  Soft and supple with no thyromegaly or any masses appreciated.  RESPIRATORY:  Lungs are clear to auscultation bilaterally with no rales,  rhonchi, or wheezes.  CARDIOVASCULAR:  S1, S2 is normal and regular.  No S3 or S4 and no murmurs  and no rubs appreciated.  No carotid bruit heard.  ABDOMEN:  Abdomen is soft and  nondistended.  Bowel sounds are present in all  quadrants.  No masses or organomegaly appreciated.  Mild epigastric  tenderness is appreciated at this time.  No rebound, no rigidity, no  guarding.  EXTREMITIES:  Extremities are without edema.  Peripheral pulses are  palpable.  No calf tenderness present.  NEUROLOGIC:  The patient is alert and oriented x3 with no gross neurologic  deficits appreciated.   LABORATORY DATA:  CBC, CMP completely unremarkable.  Two separate cardiac  enzymes done within one hour completely unremarkable.  No other lab studies  available.   Chest X-ray does not reveal any acute abnormality.   EKG reveals sinus rhythm with a normal axis.  Intervals appear to be within  the normal range.  I do not appreciate any Q waves.  There might be some  early repolarization.  Some of these ST segments in the inferior and lateral  leads suggest this.  Subsequent EKG also shows similar changes.   IMPRESSION:  This is a 55 year old African-American female with no  significant past medical history.  Risk factors include cigarette smoking  and early menopause.  He presents to the ED with left-sided chest pain,  sharp in nature.  Differential diagnosis includes acute coronary syndrome.  The patient will be admitted to the hospital to undergo serial cardiac  enzymes.  Her EKG shows some early repolarization changes.  PE is another  etiology for her chest pain.  The patient is on hormone pills which  certainly is a risk factor.  The character of the pain also is somewhat  suggestive.  Other differential include acid reflux disease which will also  be entertained.  Other esophageal problems also to be entertained as the  patient has had relief of pain with nitroglycerin.  Pericarditis is another  differential to consider.   PLAN:  1.  Chest pain.  We will observe the patient in the hospital through serial     cardiac enzymes.  We will do serial EKGs.  We will also check a  D-dimer      and ESR.  Lipid profile will be checked in the morning.  We will start      the patient on Protonix.  We will give the patient baby aspirin.  We      will withhold beta blockers and anticoagulation at this time as they are      not truly indicated.   1.  Smoking cessation.  Counseling has been done on this patient.   Further management decisions will be based on results of initial testing and  the patient's response to treatment.      Osvaldo Shipper, MD  Electronically Signed     GK/MEDQ  D:  12/09/2005  T:  12/09/2005  Job:  [id number]   cc:   Dirk Dress. Katrinka Blazing, M.D.  Fax: [phone]

## 2011-05-05 NOTE — Op Note (Signed)
NAMEBRION, HEDGES             ACCOUNT NO.:  [id number]   MEDICAL RECORD NO.:  [id number]          PATIENT TYPE:  AMB   LOCATION:  DAY                           FACILITY:  [facility]   PHYSICIAN:  Lionel December, M.D.    DATE OF BIRTH:  02-08-1956   DATE OF PROCEDURE:  10/29/2006  DATE OF DISCHARGE:                                 OPERATIVE REPORT   PROCEDURE:  Colonoscopy.   INDICATIONS:  Virginia Washington is a 55 year old African American female who had an  episode of hematochezia and LLQ abdominal pain.  She is undergoing  diagnostic exam.  Her symptoms have completely resolved.  Procedure and  risks were reviewed with the patient and informed consent was obtained.   MEDICATIONS FOR CONSCIOUS SEDATION:  Demerol 50 mg IV, Versed 6 mg IV.   FINDINGS:  Procedure performed in endoscopy suite.  The patient's vital  signs and O2 sat were monitored during the procedure and remained stable.  The patient was placed in the left lateral position and rectal examination  performed.  No abnormality noted on external or digital exam.  Olympus video  scope was placed in the rectum and advanced under vision into sigmoid colon  and beyond.  Her prep was suboptimal.  She had pieces of scattered stool  throughout the colon but more so in the proximal half.  Nevertheless, the  scope was passed in the cecum which was identified by ileocecal valve and  part of the blind cecum was seen and no abnormality noted.  In the other  part, the stool could not be washed away.  As the scope was withdrawn, the  colonic mucosa was examined for the second time, and there were no polyps,  tumor masses or changes of colitis.  Rectal mucosa similarly was normal.  The scope was retroflexed to examine the anorectal junction, and small  hemorrhoids were noted proximal to the dentate line.  Endoscope was  straightened and withdrawn.  The patient tolerated the procedure well.   FINAL DIAGNOSIS:  Small internal hemorrhoids, otherwise  normal colonoscopy.  However, exam somewhat compromised given quality of prep.   RECOMMENDATIONS:  She will continue high fiber diet and Colace as before.  We would also like her to take Citrucel of equivalent of one tablespoonful  daily.  Should she experience rectal bleeding again, she will get in touch  with [place].      Lionel December, M.D.  Electronically Signed    NR/MEDQ  D:  10/29/2006  T:  10/29/2006  Job:  [id number]   cc:   Jeoffrey Massed, MD  Fax: [phone]

## 2011-07-11 ENCOUNTER — Encounter (HOSPITAL_COMMUNITY): Payer: Self-pay

## 2011-07-11 ENCOUNTER — Emergency Department (HOSPITAL_COMMUNITY)
Admission: EM | Admit: 2011-07-11 | Discharge: 2011-07-11 | Disposition: A | Discharge status: Home / Self Care | Payer: Medicare Other | Attending: Emergency Medicine | Admitting: Emergency Medicine

## 2011-07-11 ENCOUNTER — Emergency Department (HOSPITAL_COMMUNITY): Payer: Medicare Other

## 2011-07-11 DIAGNOSIS — F17200 Nicotine dependence, unspecified, uncomplicated: Secondary | ICD-10-CM | POA: Insufficient documentation

## 2011-07-11 DIAGNOSIS — J40 Bronchitis, not specified as acute or chronic: Secondary | ICD-10-CM | POA: Insufficient documentation

## 2011-07-11 LAB — CBC
HCT: 43.9 % (ref 36.0–46.0)
MCHC: 32.1 g/dL (ref 30.0–36.0)
Platelets: 242 10*3/uL (ref 150–400)
RDW: 13.5 % (ref 11.5–15.5)

## 2011-07-11 LAB — BASIC METABOLIC PANEL
BUN: 11 mg/dL (ref 6–23)
GFR calc Af Amer: >60 mL/min (ref >60)
GFR calc non Af Amer: >60 mL/min (ref >60)
Potassium: 3.3 mEq/L — ABNORMAL LOW (ref 3.5–5.1)
Sodium: 143 mEq/L (ref 135–145)

## 2011-07-11 MED ORDER — PREDNISONE 20 MG PO TABS
60.0000 mg | ORAL_TABLET | Freq: Once | ORAL | Status: AC
  Administered 2011-07-11: 60 mg via ORAL
  Filled 2011-07-11: qty 3

## 2011-07-11 MED ORDER — ALBUTEROL SULFATE HFA 108 (90 BASE) MCG/ACT IN AERS: 2.0000 | INHALATION_SPRAY | RESPIRATORY_TRACT | Status: DC | PRN

## 2011-07-11 MED ORDER — AZITHROMYCIN 250 MG PO TABS: 250.0000 mg | ORAL_TABLET | Freq: Every day | ORAL | Status: AC

## 2011-07-11 MED ORDER — ALBUTEROL SULFATE (5 MG/ML) 0.5% IN NEBU
2.5000 mg | INHALATION_SOLUTION | Freq: Once | RESPIRATORY_TRACT | Status: AC
  Administered 2011-07-11: 2.5 mg via RESPIRATORY_TRACT
  Filled 2011-07-11: qty 0.5

## 2011-07-11 MED ORDER — ALBUTEROL SULFATE HFA 108 (90 BASE) MCG/ACT IN AERS
2.0000 | INHALATION_SPRAY | Freq: Once | RESPIRATORY_TRACT | Status: AC
  Administered 2011-07-11: 2 via RESPIRATORY_TRACT
  Filled 2011-07-11: qty 6.7

## 2011-07-11 MED ORDER — IPRATROPIUM BROMIDE 0.02 % IN SOLN
0.5000 mg | Freq: Once | RESPIRATORY_TRACT | Status: AC
  Administered 2011-07-11: 0.5 mg via RESPIRATORY_TRACT
  Filled 2011-07-11: qty 2.5

## 2011-07-11 MED ORDER — PREDNISONE 10 MG PO TABS: 60.0000 mg | ORAL_TABLET | Freq: Every day | ORAL | Status: AC

## 2011-07-11 NOTE — ED Notes (Signed)
Constant dry hacking cough started 12mn. No other uri sx

## 2011-07-11 NOTE — ED Provider Notes (Signed)
History     Chief Complaint  Patient presents with  . Cough   Patient is a 55 y.o. female presenting with cough. The history is provided by the patient.  Cough This is a new problem. The current episode started 3 to 5 hours ago. The problem occurs constantly. The problem has not changed since onset.The cough is non-productive. There has been no fever. Associated symptoms include wheezing. Pertinent negatives include no chest pain, no chills, no sweats, no ear congestion, no ear pain, no headaches, no rhinorrhea, no sore throat, no myalgias and no shortness of breath. She has tried nothing for the symptoms. Risk factors: no travel or exposures. She is a smoker. Her past medical history does not include pneumonia.    History reviewed. No pertinent past medical history.  Past Surgical History  Procedure Date  . Abdominal hysterectomy   . Cholecystectomy     History reviewed. No pertinent family history.  History  Substance Use Topics  . Smoking status: Current Everyday Smoker -- 0.5 packs/day  . Smokeless tobacco: Not on file  . Alcohol Use: No    OB History    Grav Para Term Preterm Abortions TAB SAB Ect Mult Living                  Review of Systems  Constitutional: Negative for fever and chills.  HENT: Negative for ear pain, sore throat, rhinorrhea, neck pain and neck stiffness.   Eyes: Negative for pain.  Respiratory: Positive for cough and wheezing. Negative for shortness of breath.   Cardiovascular: Negative for chest pain.  Gastrointestinal: Negative for abdominal pain.  Genitourinary: Negative for dysuria.  Musculoskeletal: Negative for myalgias and back pain.  Skin: Negative for rash.  Neurological: Negative for headaches.  All other systems reviewed and are negative.    Physical Exam  BP 117/68  Pulse 98  Temp(Src) 98.4 F (36.9 C) (Oral)  Resp 20  Ht 5' (1.524 m)  Wt 128 lb (58.06 kg)  BMI 25.00 kg/m2  SpO2 93%  Physical Exam  Constitutional: She  is oriented to person, place, and time. She appears well-developed and well-nourished.  HENT:  Head: Normocephalic and atraumatic.  Eyes: Conjunctivae and EOM are normal. Pupils are equal, round, and reactive to light.  Neck: Trachea normal. Neck supple. No thyromegaly present.  Cardiovascular: Normal rate, regular rhythm, S1 normal, S2 normal and normal pulses.     No systolic murmur is present   No diastolic murmur is present  Pulses:      Radial pulses are 2+ on the right side, and 2+ on the left side.  Pulmonary/Chest: Effort normal. She has wheezes. She has no rhonchi. She has no rales. She exhibits no tenderness.       Bilateral exp wheezes no resp distress  Abdominal: Soft. Normal appearance and bowel sounds are normal. There is no tenderness. There is no CVA tenderness and negative Murphy's sign.  Musculoskeletal:       BLE:s Calves nontender, no cords or erythema, negative Homans sign  Neurological: She is alert and oriented to person, place, and time. She has normal strength. No cranial nerve deficit or sensory deficit. GCS eye subscore is 4. GCS verbal subscore is 5. GCS motor subscore is 6.  Skin: Skin is warm and dry. No rash noted. She is not diaphoretic.  Psychiatric: Her speech is normal.       Cooperative and appropriate    ED Course  Procedures  MDM Improved with albuterol  breathing TX and prednisone PO. CXR obtained and reviewed no infiltrate or clinical PNA. Stable for discharge home inhaler and steroids with plan close PCP follow up. PT educated on complications and dangers of tob      Virginia Nielsen, MD 07/11/11 [phone]

## 2011-08-27 ENCOUNTER — Emergency Department (HOSPITAL_COMMUNITY): Payer: Medicare Other

## 2011-08-27 ENCOUNTER — Encounter (HOSPITAL_COMMUNITY): Payer: Self-pay | Admitting: Emergency Medicine

## 2011-08-27 ENCOUNTER — Emergency Department (HOSPITAL_COMMUNITY)
Admission: EM | Admit: 2011-08-27 | Discharge: 2011-08-27 | Disposition: A | Discharge status: Home / Self Care | Payer: Medicare Other | Attending: Emergency Medicine | Admitting: Emergency Medicine

## 2011-08-27 DIAGNOSIS — F17200 Nicotine dependence, unspecified, uncomplicated: Secondary | ICD-10-CM | POA: Insufficient documentation

## 2011-08-27 DIAGNOSIS — J189 Pneumonia, unspecified organism: Secondary | ICD-10-CM | POA: Insufficient documentation

## 2011-08-27 DIAGNOSIS — R0902 Hypoxemia: Secondary | ICD-10-CM | POA: Insufficient documentation

## 2011-08-27 LAB — BASIC METABOLIC PANEL
BUN: 8 mg/dL (ref 6–23)
CO2: 24 mEq/L (ref 19–32)
Calcium: 9.6 mg/dL (ref 8.4–10.5)
GFR calc non Af Amer: >60 mL/min (ref >60)
Glucose, Bld: 116 mg/dL — ABNORMAL HIGH (ref 70–99)

## 2011-08-27 LAB — CBC
HCT: 42.8 % (ref 36.0–46.0)
Hemoglobin: 14 g/dL (ref 12.0–15.0)
MCH: 28.1 pg (ref 26.0–34.0)
MCV: 85.8 fL (ref 78.0–100.0)
RBC: 4.99 MIL/uL (ref 3.87–5.11)

## 2011-08-27 LAB — DIFFERENTIAL
Eosinophils Absolute: 0 10*3/uL (ref 0.0–0.7)
Eosinophils Relative: 0 % (ref 0–5)
Lymphs Abs: 0.9 10*3/uL (ref 0.7–4.0)
Monocytes Relative: 6 % (ref 3–12)

## 2011-08-27 MED ORDER — IBUPROFEN 800 MG PO TABS
800.0000 mg | ORAL_TABLET | Freq: Once | ORAL | Status: AC
  Administered 2011-08-27: 800 mg via ORAL
  Filled 2011-08-27: qty 1

## 2011-08-27 MED ORDER — AZITHROMYCIN 250 MG PO TABS: 250.0000 mg | ORAL_TABLET | Freq: Every day | ORAL | Status: AC

## 2011-08-27 MED ORDER — OXYCODONE-ACETAMINOPHEN 5-325 MG PO TABS
1.0000 | ORAL_TABLET | Freq: Once | ORAL | Status: AC
  Administered 2011-08-27: 1 via ORAL
  Filled 2011-08-27: qty 1

## 2011-08-27 MED ORDER — AZITHROMYCIN 250 MG PO TABS
500.0000 mg | ORAL_TABLET | Freq: Once | ORAL | Status: AC
  Administered 2011-08-27: 500 mg via ORAL
  Filled 2011-08-27: qty 2

## 2011-08-27 MED ORDER — HYDROCOD POLST-CHLORPHEN POLST 10-8 MG/5ML PO LQCR: 5.0000 mL | Freq: Two times a day (BID) | ORAL | Status: DC | PRN

## 2011-08-27 NOTE — ED Provider Notes (Signed)
History    Virginia Washington is a 55 year old female who smokes cigarettes. She denies a history of COPD or cardiovascular disease. She has had a nonproductive cough for 3 days. She has rhinorrhea without congestion. She complains of a headache. She denies sore throat. She also has right ear pain. She denies chest pain. She is nauseated however she denies vomiting.  She denies leg pain or swelling.   CSN: [id number] Arrival date & time: 08/27/2011  6:13 PM  Chief Complaint  Patient presents with  . URI   Patient is a 55 y.o. female presenting with URI. The history is provided by the patient.  URI The primary symptoms include fever, headaches, ear pain and cough. Primary symptoms do not include wheezing, abdominal pain or rash.  Symptoms associated with the illness include rhinorrhea. The illness is not associated with chills or congestion.    History reviewed. No pertinent past medical history.  Past Surgical History  Procedure Date  . Abdominal hysterectomy   . Cholecystectomy     No family history on file.  History  Substance Use Topics  . Smoking status: Current Everyday Smoker -- 0.5 packs/day  . Smokeless tobacco: Not on file  . Alcohol Use: No    OB History    Grav Para Term Preterm Abortions TAB SAB Ect Mult Living                  Review of Systems  Constitutional: Positive for fever. Negative for chills and diaphoresis.  HENT: Positive for ear pain and rhinorrhea. Negative for congestion and neck pain.   Eyes: Negative for redness.  Respiratory: Positive for cough. Negative for chest tightness, shortness of breath and wheezing.   Cardiovascular: Negative for chest pain.  Gastrointestinal: Negative for abdominal pain and abdominal distention.  Genitourinary: Negative for dysuria.  Musculoskeletal: Positive for back pain.  Skin: Negative for rash.  Neurological: Positive for headaches. Negative for light-headedness and numbness.  Psychiatric/Behavioral: Negative for  confusion.    Physical Exam  BP 114/62  Pulse 109  Temp(Src) 99.4 F (37.4 C) (Oral)  Resp 18  SpO2 95%  Physical Exam  Nursing note and vitals reviewed. Constitutional: She is oriented to person, place, and time. She appears well-developed and well-nourished.  HENT:  Head: Normocephalic and atraumatic.  Right Ear: External ear normal.  Left Ear: External ear normal.  Eyes: EOM are normal. Pupils are equal, round, and reactive to light.  Neck: Normal range of motion.  Cardiovascular: Regular rhythm and normal heart sounds.   No murmur heard.      tachycardia  Pulmonary/Chest: Effort normal. No respiratory distress. She has no wheezes. She has rales.       Rales in bilat lower lobes posteriorly  Abdominal: Soft. She exhibits no distension and no mass. There is no tenderness. There is no rebound and no guarding.  Musculoskeletal: Normal range of motion. She exhibits no edema and no tenderness.  Neurological: She is alert and oriented to person, place, and time. No cranial nerve deficit.  Skin: Skin is warm and dry. No rash noted. No erythema.  Psychiatric: She has a normal mood and affect. Her behavior is normal.    ED Course  Procedures  . will perform a chest x-ray and laboratory testing for this elderly lady with a nonproductive cough and subjective fevers. I will also treat her pain  And cough in the emergency department   Results for orders placed during the hospital encounter of 08/27/11  CBC  Component Value Range   WBC 11.7 (*) 4.0 - 10.5 (K/uL)   RBC 4.99  3.87 - 5.11 (MIL/uL)   Hemoglobin 14.0  12.0 - 15.0 (g/dL)   HCT 46.9  62.9 - 52.8 (%)   MCV 85.8  78.0 - 100.0 (fL)   MCH 28.1  26.0 - 34.0 (pg)   MCHC 32.7  30.0 - 36.0 (g/dL)   RDW 41.3  24.4 - 01.0 (%)   Platelets 204  150 - 400 (K/uL)  DIFFERENTIAL      Component Value Range   Neutrophils Relative 86 (*) 43 - 77 (%)   Neutro Abs 10.2 (*) 1.7 - 7.7 (K/uL)   Lymphocytes Relative 8 (*) 12 - 46 (%)     Lymphs Abs 0.9  0.7 - 4.0 (K/uL)   Monocytes Relative 6  3 - 12 (%)   Monocytes Absolute 0.6  0.1 - 1.0 (K/uL)   Eosinophils Relative 0  0 - 5 (%)   Eosinophils Absolute 0.0  0.0 - 0.7 (K/uL)   Basophils Relative 0  0 - 1 (%)   Basophils Absolute 0.0  0.0 - 0.1 (K/uL)  BASIC METABOLIC PANEL      Component Value Range   Sodium 135  135 - 145 (mEq/L)   Potassium 3.6  3.5 - 5.1 (mEq/L)   Chloride 101  96 - 112 (mEq/L)   CO2 24  19 - 32 (mEq/L)   Glucose, Bld 116 (*) 70 - 99 (mg/dL)   BUN 8  6 - 23 (mg/dL)   Creatinine, Ser 2.72  0.50 - 1.10 (mg/dL)   Calcium 9.6  8.4 - 53.6 (mg/dL)   GFR calc non Af Amer >60  >60 (mL/min)   GFR calc Af Amer >60  >60 (mL/min)   Dg Chest 2 View  08/27/2011  *RADIOLOGY REPORT*  Clinical Data: Cough.  Chest congestion.  Fever.  Lethargy.  CHEST - 2 VIEW  Comparison: 07/11/2011.  Findings: Interval mild amount of increased density at both lung bases.  This appears slightly patchy on the right.  Stable diffuse peribronchial thickening and accentuation of the interstitial markings elsewhere in the lungs.  Normal sized heart.  Mild scoliosis.  IMPRESSION:  1.  Interval mild right basilar pneumonia and possible minimal left basilar pneumonia. 2.  Stable chronic bronchitic changes and chronic interstitial lung disease.  Original Report Authenticated By: Darrol Angel, M.D.     MDM CAP with no resp distress.  Only mild hypoxia. No significant leukocytosis.  Not toxic.  NO comorbid illness that preclude outpt tx.  eg no hx cva,renal dz, liver dz. Chf, or ca.        Nicholes Stairs, MD 08/27/11 2014

## 2011-08-27 NOTE — ED Notes (Signed)
Pt states she called EMS because she thinks she has a cold and is afraid it will turn into pneumonia. Also reports falling at home today. Denies injury from fall

## 2011-08-27 NOTE — ED Notes (Signed)
Pt states she called EMS because she has a cold and is afraid it will turn into pneumonia if she doesn't get treatment. Also reports fall today but denies injury

## 2011-09-07 LAB — URINALYSIS, ROUTINE W REFLEX MICROSCOPIC
Glucose, UA: NEGATIVE
Hgb urine dipstick: NEGATIVE
Protein, ur: NEGATIVE
Specific Gravity, Urine: >1.03 — ABNORMAL HIGH
Urobilinogen, UA: 0.2

## 2011-09-07 LAB — URINE MICROSCOPIC-ADD ON

## 2011-09-07 LAB — URINE CULTURE: Colony Count: 3000

## 2011-09-14 LAB — URINALYSIS, ROUTINE W REFLEX MICROSCOPIC
Bilirubin Urine: NEGATIVE
Glucose, UA: NEGATIVE
Ketones, ur: NEGATIVE
Protein, ur: NEGATIVE
pH: 5.5

## 2011-09-14 LAB — BASIC METABOLIC PANEL
Chloride: 108
GFR calc Af Amer: >60
GFR calc non Af Amer: >60
Potassium: 3.7
Sodium: 140

## 2011-09-14 LAB — HEPATIC FUNCTION PANEL
ALT: 14
Alkaline Phosphatase: 76
Total Bilirubin: 0.4
Total Protein: 6.2

## 2011-09-14 LAB — CBC
HCT: 41.5
Hemoglobin: 13.6
MCV: 86.1
Platelets: 185
RBC: 4.82
WBC: 6.8

## 2011-09-14 LAB — DIFFERENTIAL
Eosinophils Absolute: 0.1
Eosinophils Relative: 1
Lymphocytes Relative: 40
Lymphs Abs: 2.7
Monocytes Relative: 8

## 2011-09-14 LAB — LIPASE, BLOOD: Lipase: 34

## 2011-09-14 LAB — URINE MICROSCOPIC-ADD ON

## 2011-09-14 LAB — AMYLASE: Amylase: 71

## 2011-09-15 ENCOUNTER — Emergency Department (HOSPITAL_COMMUNITY): Payer: Medicare Other

## 2011-09-15 ENCOUNTER — Emergency Department (HOSPITAL_COMMUNITY)
Admission: EM | Admit: 2011-09-15 | Discharge: 2011-09-15 | Disposition: A | Discharge status: Home / Self Care | Payer: Medicare Other | Attending: Emergency Medicine | Admitting: Emergency Medicine

## 2011-09-15 ENCOUNTER — Telehealth: Payer: Self-pay | Admitting: Family Medicine

## 2011-09-15 ENCOUNTER — Encounter (HOSPITAL_COMMUNITY): Payer: Self-pay | Admitting: *Deleted

## 2011-09-15 DIAGNOSIS — R6883 Chills (without fever): Secondary | ICD-10-CM | POA: Insufficient documentation

## 2011-09-15 DIAGNOSIS — R059 Cough, unspecified: Secondary | ICD-10-CM

## 2011-09-15 DIAGNOSIS — R61 Generalized hyperhidrosis: Secondary | ICD-10-CM | POA: Insufficient documentation

## 2011-09-15 DIAGNOSIS — R05 Cough: Secondary | ICD-10-CM

## 2011-09-15 DIAGNOSIS — F17200 Nicotine dependence, unspecified, uncomplicated: Secondary | ICD-10-CM | POA: Insufficient documentation

## 2011-09-15 LAB — CBC
HCT: 43.6 % (ref 36.0–46.0)
Hemoglobin: 14.1 g/dL (ref 12.0–15.0)
MCH: 28.3 pg (ref 26.0–34.0)
MCHC: 32.3 g/dL (ref 30.0–36.0)
RDW: 14.4 % (ref 11.5–15.5)

## 2011-09-15 LAB — BASIC METABOLIC PANEL
BUN: 13 mg/dL (ref 6–23)
Creatinine, Ser: 0.8 mg/dL (ref 0.50–1.10)
GFR calc Af Amer: >60 mL/min (ref >60)
GFR calc non Af Amer: >60 mL/min (ref >60)

## 2011-09-15 LAB — DIFFERENTIAL
Basophils Absolute: 0.1 10*3/uL (ref 0.0–0.1)
Basophils Relative: 1 % (ref 0–1)
Eosinophils Absolute: 0.2 10*3/uL (ref 0.0–0.7)
Monocytes Absolute: 0.6 10*3/uL (ref 0.1–1.0)
Monocytes Relative: 9 % (ref 3–12)
Neutro Abs: 3 10*3/uL (ref 1.7–7.7)
Neutrophils Relative %: 47 % (ref 43–77)

## 2011-09-15 MED ORDER — MOXIFLOXACIN HCL 400 MG PO TABS: 400.0000 mg | ORAL_TABLET | Freq: Every day | ORAL | Status: AC

## 2011-09-15 MED ORDER — ALBUTEROL SULFATE HFA 108 (90 BASE) MCG/ACT IN AERS: 1.0000 | INHALATION_SPRAY | RESPIRATORY_TRACT | Status: DC | PRN

## 2011-09-15 NOTE — ED Notes (Signed)
Pauline Aus, PA at bedside for pt evaluation.

## 2011-09-15 NOTE — ED Notes (Signed)
Patient c/o cough and sweating at night for 3 days. Denies fever. Non-productive cough noted. Skin warm/dry. Respirations even and unlabored.

## 2011-09-15 NOTE — ED Notes (Signed)
Patient with no complaints at this time. Respirations even and unlabored. Skin warm/dry. Discharge instructions reviewed with patient at this time. Patient given opportunity to voice concerns/ask questions. Patient discharged at this time and left Emergency Department with steady gait.   

## 2011-09-15 NOTE — Telephone Encounter (Addendum)
Virginia Washington is a former patient of mine at [facility] in [place], [place]. I continue to get ED encounter records sent to me via [facility] b/c I'm still listed as her PCP---she has had three episodes of pneumonia/asthmatic bronchitis this year (was seen today at [facility] ED for this) and I want to make sure patient follows up appropriately. I talked to Modena Nunnery at [facility] today and relayed my concerns and she noted that [name] had been in only once since I last saw her there in October 2011, and this was at the end of June this year for a UTI. I told her I would change her PCP to Dr. Milford Cage ([facility]) and [name] should be called and instructed to go in to see Dr. Milford Cage for follow up of her pneumonia/COPD.   Forest Becker called back and said she had contacted [name] and she scheduled an appt with Dr. Milford Cage on Tuesday, October 2nd, 2012. Will fax Dr. Webb Laws office any of her pertinent records that we have received via [facility] over the last few months.--PM

## 2011-09-15 NOTE — ED Provider Notes (Signed)
History     CSN: [id number] Arrival date & time: 09/15/2011 10:12 AM  Chief Complaint  Patient presents with  . Cough  . Excessive Sweating    (Consider location/radiation/quality/duration/timing/severity/associated sxs/prior treatment) HPI Comments: Patient c/o persistent cough for > one month that has gradually increased for 3 days.  States she was seen here earlier this month for cough and took a course of zithromax with slight improvement.  States she has sweating episodes with excessive coughing.  She denies fever, weight loss, hemoptysis or shortness of breath  Patient is a 55 y.o. female presenting with cough. The history is provided by the patient.  Cough This is a recurrent problem. The current episode started more than 2 days ago. The problem occurs every few minutes. The problem has been gradually worsening. The cough is productive of sputum. There has been no fever. Associated symptoms include chills and sweats. Pertinent negatives include no chest pain, no weight loss, no ear congestion, no ear pain, no headaches, no rhinorrhea, no sore throat, no myalgias, no shortness of breath and no wheezing. The treatment provided no relief. She is a smoker. Her past medical history is significant for bronchitis and pneumonia. Her past medical history does not include asthma.    History reviewed. No pertinent past medical history.  Past Surgical History  Procedure Date  . Abdominal hysterectomy   . Cholecystectomy     History reviewed. No pertinent family history.  History  Substance Use Topics  . Smoking status: Current Everyday Smoker -- 0.5 packs/day  . Smokeless tobacco: Not on file  . Alcohol Use: No    OB History    Grav Para Term Preterm Abortions TAB SAB Ect Mult Living                  Review of Systems  Constitutional: Positive for chills and diaphoresis. Negative for fever, weight loss, activity change, appetite change and unexpected weight change.  HENT:  Negative for ear pain, sore throat, rhinorrhea, trouble swallowing and neck stiffness.   Respiratory: Positive for cough. Negative for chest tightness, shortness of breath and wheezing.   Cardiovascular: Negative for chest pain.  Gastrointestinal: Negative for vomiting, abdominal pain and diarrhea.  Musculoskeletal: Negative.  Negative for myalgias.  Skin: Negative.   Neurological: Negative for dizziness, weakness, numbness and headaches.  Hematological: Does not bruise/bleed easily.  All other systems reviewed and are negative.    Allergies  Review of patient's allergies indicates no known allergies.  Home Medications   Current Outpatient Rx  Name Route Sig Dispense Refill  . ALBUTEROL SULFATE HFA 108 (90 BASE) MCG/ACT IN AERS Inhalation Inhale 2 puffs into the lungs every 4 (four) hours as needed for wheezing. 1 Inhaler 0  . HYDROCOD POLST-CHLORPHEN POLST 10-8 MG/5ML PO LQCR Oral Take 5 mLs by mouth every 12 (twelve) hours as needed. 140 mL 0    BP 140/84  Pulse 71  Temp 97.9 F (36.6 C)  Resp 20  Ht 5\' 2"  (1.575 m)  Wt 127 lb 9.6 oz (57.879 kg)  BMI 23.34 kg/m2  SpO2 99%  Physical Exam  Nursing note and vitals reviewed. Constitutional: She appears well-developed and well-nourished. No distress.  HENT:  Head: Normocephalic and atraumatic.  Mouth/Throat: Oropharynx is clear and moist.  Neck: Normal range of motion. Neck supple. No JVD present.  Cardiovascular: Normal rate, regular rhythm and normal heart sounds.   Pulmonary/Chest: Effort normal. No stridor. No respiratory distress. She has no decreased breath sounds. She  has no wheezes. She has rhonchi. She has no rales. She exhibits no tenderness.  Abdominal: Soft. She exhibits no mass. There is no tenderness. There is no rebound and no guarding.  Musculoskeletal: She exhibits no edema and no tenderness.  Lymphadenopathy:    She has no cervical adenopathy.  Neurological: No cranial nerve deficit. She exhibits normal  muscle tone. Coordination normal.  Skin: Skin is warm and dry.    ED Course  Procedures (including critical care time)  Results for orders placed during the hospital encounter of 09/15/11  CBC      Component Value Range   WBC 6.4  4.0 - 10.5 (K/uL)   RBC 4.98  3.87 - 5.11 (MIL/uL)   Hemoglobin 14.1  12.0 - 15.0 (g/dL)   HCT 16.1  09.6 - 04.5 (%)   MCV 87.6  78.0 - 100.0 (fL)   MCH 28.3  26.0 - 34.0 (pg)   MCHC 32.3  30.0 - 36.0 (g/dL)   RDW 40.9  81.1 - 91.4 (%)   Platelets 203  150 - 400 (K/uL)  DIFFERENTIAL      Component Value Range   Neutrophils Relative 47  43 - 77 (%)   Neutro Abs 3.0  1.7 - 7.7 (K/uL)   Lymphocytes Relative 41  12 - 46 (%)   Lymphs Abs 2.6  0.7 - 4.0 (K/uL)   Monocytes Relative 9  3 - 12 (%)   Monocytes Absolute 0.6  0.1 - 1.0 (K/uL)   Eosinophils Relative 2  0 - 5 (%)   Eosinophils Absolute 0.2  0.0 - 0.7 (K/uL)   Basophils Relative 1  0 - 1 (%)   Basophils Absolute 0.1  0.0 - 0.1 (K/uL)  BASIC METABOLIC PANEL      Component Value Range   Sodium 139  135 - 145 (mEq/L)   Potassium 3.8  3.5 - 5.1 (mEq/L)   Chloride 105  96 - 112 (mEq/L)   CO2 25  19 - 32 (mEq/L)   Glucose, Bld 91  70 - 99 (mg/dL)   BUN 13  6 - 23 (mg/dL)   Creatinine, Ser 7.82  0.50 - 1.10 (mg/dL)   Calcium 9.6  8.4 - 95.6 (mg/dL)   GFR calc non Af Amer >60  >60 (mL/min)   GFR calc Af Amer >60  >60 (mL/min)    Dg Chest 2 View  09/15/2011  *RADIOLOGY REPORT*  Clinical Data: Cough and congestion for 3 days  CHEST - 2 VIEW  Comparison: 08/27/2011; 07/11/2011; 02/15/2011; 02/09/2011; chest CTA - 02/15/2011  Findings:  Unchanged cardiac silhouette and mediastinal contours. Improved aeration of the right lung base with persistent heterogeneous possible air space opacities within the left lung base.  No pleural effusion or pneumothorax.  Unchanged bones.  Post cholecystectomy.  IMPRESSION: Improved aeration of the right lung base with persistent left lower lung heterogeneous possible  airspace opacities worrisome for infection.  A follow-up chest radiograph in 4-6 weeks after treatment recommended to ensure resolution.  Alternatively, a follow-up chest CT may be obtained as follow-up  as this had been recommended on prior CT from 02/29/2010.  Original Report Authenticated By: Waynard Reeds, M.D.   Ct Chest Wo Contrast  09/15/2011  *RADIOLOGY REPORT*  Clinical Data: Cough and excessive sweating  CT CHEST WITHOUT CONTRAST  Technique:  Multidetector CT imaging of the chest was performed following the standard protocol without IV contrast.  Comparison: 02/15/2011  Findings: No enlarged axillary or supraclavicular lymph nodes.  There are no enlarged mediastinal or hilar lymph nodes.  There is no pericardial or pleural effusion identified.  The trachea appears patent and is midline.  No endobronchial lesion identified.  Mild emphysema.  Pleural based nodule within the right middle lobe along the minor fissure is unchanged from previous examination measuring 6.2 mm.  No new or enlarging nodules or masses.  No airspace consolidation identified.  Prior cholecystectomy.  Review of the visualized osseous structures is unremarkable.  IMPRESSION:  1.  No active cardiopulmonary abnormalities. 2.  Emphysema. 3.  Stable pleural-based nodule along the minor fissure of the right lung.  Original Report Authenticated By: Rosealee Albee, M.D.        MDM     1055  Patient is alert, NAD.  Vitals are stable.  No tachycardia, tachypnea or hypoxia.  Non-toxic appearing.  I have reviewed the patient's previous ED chart and since improvement has been minimal since last ED visit, I will repeat the CXR   Carleene Cooper III, MD to bedside for evaluation. Patient explained lab and imaging results. Discussed intent to administer antibiotics and need for patient to quit smoking. Patient acknowledges that she understands this advisement at this time.   Medical screening examination/treatment/procedure(s) were  conducted as a shared visit with non-physician practitioner(s) and myself.  I personally evaluated the patient during the encounter  Carleene Cooper III, MD   1:47 PM patient agrees to close follow-up with her PMD.  I will prescribe her Avelox and albuterol.     Tammy L. [place], [place] 09/16/11 1646  Carleene Cooper III, MD 09/17/11 [phone]

## 2011-09-15 NOTE — ED Notes (Signed)
Pt c/o coughing and sweating x 3 days

## 2011-09-28 LAB — URINALYSIS, ROUTINE W REFLEX MICROSCOPIC
Bilirubin Urine: NEGATIVE
Nitrite: NEGATIVE
Protein, ur: NEGATIVE
Urobilinogen, UA: 0.2

## 2011-09-28 LAB — URINE CULTURE: Culture: NO GROWTH

## 2011-10-02 LAB — RAPID URINE DRUG SCREEN, HOSP PERFORMED
Barbiturates: NOT DETECTED
Benzodiazepines: NOT DETECTED
Cocaine: NOT DETECTED

## 2011-10-29 ENCOUNTER — Encounter (HOSPITAL_COMMUNITY): Payer: Self-pay | Admitting: Emergency Medicine

## 2011-10-29 ENCOUNTER — Emergency Department (HOSPITAL_COMMUNITY): Payer: Medicare Other

## 2011-10-29 ENCOUNTER — Inpatient Hospital Stay (HOSPITAL_COMMUNITY)
Admission: EM | Admit: 2011-10-29 | Discharge: 2011-10-31 | DRG: 195 | Disposition: A | Discharge status: Home / Self Care | Payer: Medicare Other | Attending: Internal Medicine | Admitting: Internal Medicine

## 2011-10-29 DIAGNOSIS — R1110 Vomiting, unspecified: Secondary | ICD-10-CM

## 2011-10-29 DIAGNOSIS — R112 Nausea with vomiting, unspecified: Secondary | ICD-10-CM

## 2011-10-29 DIAGNOSIS — R197 Diarrhea, unspecified: Secondary | ICD-10-CM

## 2011-10-29 DIAGNOSIS — I951 Orthostatic hypotension: Secondary | ICD-10-CM

## 2011-10-29 DIAGNOSIS — J189 Pneumonia, unspecified organism: Principal | ICD-10-CM | POA: Diagnosis present

## 2011-10-29 DIAGNOSIS — F17200 Nicotine dependence, unspecified, uncomplicated: Secondary | ICD-10-CM | POA: Diagnosis present

## 2011-10-29 DIAGNOSIS — Z720 Tobacco use: Secondary | ICD-10-CM

## 2011-10-29 DIAGNOSIS — E876 Hypokalemia: Secondary | ICD-10-CM | POA: Diagnosis not present

## 2011-10-29 LAB — CBC
HCT: 45.1 % (ref 36.0–46.0)
Hemoglobin: 14 g/dL (ref 12.0–15.0)
MCHC: 31 g/dL (ref 30.0–36.0)
MCV: 88.6 fL (ref 78.0–100.0)
RDW: 14.2 % (ref 11.5–15.5)
WBC: 12.2 10*3/uL — ABNORMAL HIGH (ref 4.0–10.5)

## 2011-10-29 LAB — COMPREHENSIVE METABOLIC PANEL
AST: 21 U/L (ref 0–37)
BUN: 10 mg/dL (ref 6–23)
CO2: 24 mEq/L (ref 19–32)
Calcium: 9.6 mg/dL (ref 8.4–10.5)
Chloride: 107 mEq/L (ref 96–112)
Creatinine, Ser: 0.92 mg/dL (ref 0.50–1.10)
GFR calc Af Amer: 81 mL/min — ABNORMAL LOW (ref >90)
GFR calc non Af Amer: 70 mL/min — ABNORMAL LOW (ref >90)
Total Bilirubin: 0.3 mg/dL (ref 0.3–1.2)

## 2011-10-29 LAB — URINALYSIS, ROUTINE W REFLEX MICROSCOPIC
Ketones, ur: NEGATIVE mg/dL
Leukocytes, UA: NEGATIVE
Nitrite: NEGATIVE
Protein, ur: NEGATIVE mg/dL
Urobilinogen, UA: 0.2 mg/dL (ref 0.0–1.0)

## 2011-10-29 LAB — DIFFERENTIAL
Basophils Absolute: 0 10*3/uL (ref 0.0–0.1)
Eosinophils Relative: 0 % (ref 0–5)
Lymphocytes Relative: 9 % — ABNORMAL LOW (ref 12–46)
Monocytes Absolute: 0.6 10*3/uL (ref 0.1–1.0)
Monocytes Relative: 5 % (ref 3–12)

## 2011-10-29 LAB — LIPASE, BLOOD: Lipase: 37 U/L (ref 11–59)

## 2011-10-29 MED ORDER — NICOTINE 14 MG/24HR TD PT24
14.0000 mg | MEDICATED_PATCH | Freq: Every day | TRANSDERMAL | Status: DC
  Administered 2011-10-29 – 2011-10-31 (×3): 14 mg via TRANSDERMAL
  Filled 2011-10-29 (×3): qty 1

## 2011-10-29 MED ORDER — ONDANSETRON HCL 4 MG PO TABS: 4.0000 mg | ORAL_TABLET | Freq: Four times a day (QID) | ORAL | Status: DC | PRN

## 2011-10-29 MED ORDER — ENOXAPARIN SODIUM 40 MG/0.4ML ~~LOC~~ SOLN
40.0000 mg | SUBCUTANEOUS | Status: DC
  Administered 2011-10-29 – 2011-10-30 (×2): 40 mg via SUBCUTANEOUS
  Filled 2011-10-29 (×2): qty 0.4

## 2011-10-29 MED ORDER — ALBUTEROL SULFATE (5 MG/ML) 0.5% IN NEBU
2.5000 mg | INHALATION_SOLUTION | RESPIRATORY_TRACT | Status: DC | PRN
  Administered 2011-10-29 (×2): 2.5 mg via RESPIRATORY_TRACT
  Filled 2011-10-29 (×2): qty 0.5

## 2011-10-29 MED ORDER — ONDANSETRON HCL 4 MG/2ML IJ SOLN
4.0000 mg | Freq: Four times a day (QID) | INTRAMUSCULAR | Status: DC | PRN
  Administered 2011-10-31: 4 mg via INTRAVENOUS
  Filled 2011-10-29: qty 2

## 2011-10-29 MED ORDER — ACETAMINOPHEN 650 MG RE SUPP: 650.0000 mg | Freq: Four times a day (QID) | RECTAL | Status: DC | PRN

## 2011-10-29 MED ORDER — DEXTROSE 5 % IV SOLN
1.0000 g | Freq: Once | INTRAVENOUS | Status: AC
  Administered 2011-10-29: 1 g via INTRAVENOUS
  Filled 2011-10-29: qty 10

## 2011-10-29 MED ORDER — DEXTROSE 5 % IV SOLN
1.0000 g | INTRAVENOUS | Status: DC
  Administered 2011-10-30: 1 g via INTRAVENOUS
  Filled 2011-10-29 (×4): qty 10

## 2011-10-29 MED ORDER — SODIUM CHLORIDE 0.9 % IV BOLUS (SEPSIS)
500.0000 mL | Freq: Once | INTRAVENOUS | Status: AC
  Administered 2011-10-29: 09:00:00 via INTRAVENOUS

## 2011-10-29 MED ORDER — SODIUM CHLORIDE 0.9 % IV SOLN
INTRAVENOUS | Status: AC
  Administered 2011-10-29: 15:00:00 via INTRAVENOUS

## 2011-10-29 MED ORDER — ALBUTEROL SULFATE (5 MG/ML) 0.5% IN NEBU
2.5000 mg | INHALATION_SOLUTION | Freq: Three times a day (TID) | RESPIRATORY_TRACT | Status: DC
  Administered 2011-10-29 – 2011-10-31 (×5): 2.5 mg via RESPIRATORY_TRACT
  Filled 2011-10-29 (×5): qty 0.5

## 2011-10-29 MED ORDER — GUAIFENESIN ER 600 MG PO TB12
600.0000 mg | ORAL_TABLET | Freq: Two times a day (BID) | ORAL | Status: DC
  Administered 2011-10-29 – 2011-10-31 (×4): 600 mg via ORAL
  Filled 2011-10-29 (×4): qty 1

## 2011-10-29 MED ORDER — SODIUM CHLORIDE 0.9 % IV SOLN
Freq: Once | INTRAVENOUS | Status: AC
  Administered 2011-10-29: 10:00:00 via INTRAVENOUS

## 2011-10-29 MED ORDER — DEXTROSE 5 % IV SOLN
500.0000 mg | INTRAVENOUS | Status: DC
  Administered 2011-10-29 – 2011-10-30 (×2): 500 mg via INTRAVENOUS
  Filled 2011-10-29 (×4): qty 500

## 2011-10-29 MED ORDER — ACETAMINOPHEN 325 MG PO TABS
650.0000 mg | ORAL_TABLET | Freq: Four times a day (QID) | ORAL | Status: DC | PRN
  Administered 2011-10-29: 650 mg via ORAL
  Filled 2011-10-29: qty 2

## 2011-10-29 MED ORDER — DEXTROSE 5 % IV SOLN: 500.0000 mg | Freq: Once | INTRAVENOUS | Status: DC

## 2011-10-29 MED ORDER — ONDANSETRON HCL 4 MG/2ML IJ SOLN
4.0000 mg | Freq: Once | INTRAMUSCULAR | Status: AC
  Administered 2011-10-29: 4 mg via INTRAVENOUS
  Filled 2011-10-29: qty 2

## 2011-10-29 MED ORDER — SODIUM CHLORIDE 0.9 % IV SOLN
INTRAVENOUS | Status: DC
  Administered 2011-10-30 – 2011-10-31 (×3): via INTRAVENOUS

## 2011-10-29 MED ORDER — SENNA 8.6 MG PO TABS: 2.0000 | ORAL_TABLET | Freq: Every day | ORAL | Status: DC | PRN

## 2011-10-29 MED ORDER — OXYCODONE HCL 5 MG PO TABS
5.0000 mg | ORAL_TABLET | ORAL | Status: DC | PRN
  Administered 2011-10-29 – 2011-10-30 (×3): 5 mg via ORAL
  Filled 2011-10-29 (×4): qty 1

## 2011-10-29 MED ORDER — SODIUM CHLORIDE 0.9 % IV SOLN
Freq: Once | INTRAVENOUS | Status: AC
  Administered 2011-10-29: 09:00:00 via INTRAVENOUS

## 2011-10-29 NOTE — ED Notes (Signed)
Pt transported to the floor.

## 2011-10-29 NOTE — Progress Notes (Signed)
Notified the MD of patient having a hard time breathing recommendations: orders for increased O2, breathing and treatments.  MD stated the patient had to be on at least 4-5 liters due to pretty bad pneumonia in the ED so keep her on 4-5 liters she also said she'd order breathing treatments.  After increasing the patients O2 the patient seems to be less dyspneic.  Will continue to monitor.

## 2011-10-29 NOTE — ED Notes (Signed)
Pt c/o lower abdominal pain, nausea, vomiting, diarrhea and difficulty breathing since 5 am. Pt alert and oriented x 3. Skin warm and dry. Color pink. Respirations deep and tachypnea noted. Breath sounds clear and equal bilaterally. Abdomen soft and non distended. Nasal 02 placed on patient at 4L/min via [place] due to Sa02 88%. Sa02 increased to 95%. Pt states that she is coughing up clear phlegm.

## 2011-10-29 NOTE — ED Notes (Signed)
Pt c/o n/v/d since 0500 this am

## 2011-10-29 NOTE — ED Notes (Signed)
Pt resting quietly at present. IV infusing with no edema or redness.

## 2011-10-29 NOTE — ED Notes (Signed)
Notified Kendal Hymen (R.N.) of ortho b/p

## 2011-10-29 NOTE — H&P (Signed)
PCP:   Ara Kussmaul, MD   Chief Complaint:  Productive cough, nausea, vomiting.  HPI: Patient is a pleasant 55 year old African American woman who looks at least 10 years older than her stated age, who presents to the hospital today with a cough productive of whitish sputum as well as nausea and vomiting that all began at about 5:00 this morning. She states she has some right upper and left upper quadrant abdominal pain that began during the same timeframe. She says she has not taken her temperature but she has felt chills. Denies any shortness of breath. She is found to have bilateral lower lobe pneumonia and because of this we're asked to admit her for further evaluation and management.  Allergies:  No Known Allergies   History reviewed. No pertinent past medical history.  Past Surgical History  Procedure Date  . Abdominal hysterectomy   . Cholecystectomy     Prior to Admission medications   Medication Sig Start Date End Date Taking? Authorizing Provider  albuterol (PROVENTIL HFA;VENTOLIN HFA) 108 (90 BASE) MCG/ACT inhaler Inhale 1-2 puffs into the lungs every 4 (four) hours as needed for wheezing. 09/15/11 09/14/12 Yes Tammy L. Triplett, PA    Social History:  reports that she has been smoking.  She does not have any smokeless tobacco history on file. She reports that she does not drink alcohol or use illicit drugs.  History reviewed. No pertinent family history.  Review of Systems:  Constitutional: Denies fever, diaphoresis, appetite change and fatigue.  HEENT: Denies photophobia, eye pain, redness, hearing loss, ear pain, congestion, sore throat, rhinorrhea, sneezing, mouth sores, trouble swallowing, neck pain, neck stiffness and tinnitus.   Respiratory: Denies SOB, DOE,  chest tightness,  and wheezing.   Cardiovascular: Denies chest pain, palpitations and leg swelling.  Gastrointestinal: Denies , diarrhea, constipation, blood in stool and abdominal distention.    Genitourinary: Denies dysuria, urgency, frequency, hematuria, flank pain and difficulty urinating.  Musculoskeletal: Denies myalgias, back pain, joint swelling, arthralgias and gait problem.  Skin: Denies pallor, rash and wound.  Neurological: Denies dizziness, seizures, syncope, weakness, light-headedness, numbness and headaches.  Hematological: Denies adenopathy. Easy bruising, personal or family bleeding history  Psychiatric/Behavioral: Denies suicidal ideation, mood changes, confusion, nervousness, sleep disturbance and agitation   Physical Exam: Blood pressure 126/66, pulse 81, temperature 98.7 F (37.1 C), temperature source Oral, resp. rate 20, height 5\' 7"  (1.702 m), weight 58.968 kg (130 lb), SpO2 99.00%. General: Alert, awake, oriented x3. HEENT: Normocephalic, atraumatic, pupils equal round and reactive to light, intact extraocular movements, dry mucous membranes, poor dentition. Neck: Supple, no JVD, no lymphadenopathy, no bruits, no goiter. Cardiovascular: Regular rate and rhythm, without murmurs rubs or gallops. Respiratory: Fair air movement, bilateral lower lobe faint crackles. Abdomen: Tender to palpation to right upper quadrant and left upper quadrant, soft, nondistended, positive bowel sounds, no masses or organomegaly noted. Extremities: No clubbing, cyanosis or edema. Positive pedal pulses. Neurological: Grossly intact and nonfocal. Skin: No rashes.  Labs on Admission:  Results for orders placed during the hospital encounter of 10/29/11 (from the past 48 hour(s))  CBC     Status: Abnormal   Collection Time   10/29/11  8:45 AM      Component Value Range Comment   WBC 12.2 (*) 4.0 - 10.5 (K/uL)    RBC 5.09  3.87 - 5.11 (MIL/uL)    Hemoglobin 14.0  12.0 - 15.0 (g/dL)    HCT 16.1  09.6 - 04.5 (%)    MCV 88.6  78.0 - 100.0 (fL)    MCH 27.5  26.0 - 34.0 (pg)    MCHC 31.0  30.0 - 36.0 (g/dL)    RDW 29.5  62.1 - 30.8 (%)    Platelets 246  150 - 400 (K/uL)    DIFFERENTIAL     Status: Abnormal   Collection Time   10/29/11  8:45 AM      Component Value Range Comment   Neutrophils Relative 86 (*) 43 - 77 (%)    Neutro Abs 10.4 (*) 1.7 - 7.7 (K/uL)    Lymphocytes Relative 9 (*) 12 - 46 (%)    Lymphs Abs 1.1  0.7 - 4.0 (K/uL)    Monocytes Relative 5  3 - 12 (%)    Monocytes Absolute 0.6  0.1 - 1.0 (K/uL)    Eosinophils Relative 0  0 - 5 (%)    Eosinophils Absolute 0.0  0.0 - 0.7 (K/uL)    Basophils Relative 0  0 - 1 (%)    Basophils Absolute 0.0  0.0 - 0.1 (K/uL)   COMPREHENSIVE METABOLIC PANEL     Status: Abnormal   Collection Time   10/29/11  8:45 AM      Component Value Range Comment   Sodium 142  135 - 145 (mEq/L)    Potassium 3.5  3.5 - 5.1 (mEq/L)    Chloride 107  96 - 112 (mEq/L)    CO2 24  19 - 32 (mEq/L)    Glucose, Bld 132 (*) 70 - 99 (mg/dL)    BUN 10  6 - 23 (mg/dL)    Creatinine, Ser 6.57  0.50 - 1.10 (mg/dL)    Calcium 9.6  8.4 - 10.5 (mg/dL)    Total Protein 7.3  6.0 - 8.3 (g/dL)    Albumin 3.7  3.5 - 5.2 (g/dL)    AST 21  0 - 37 (U/L)    ALT 16  0 - 35 (U/L)    Alkaline Phosphatase 99  39 - 117 (U/L)    Total Bilirubin 0.3  0.3 - 1.2 (mg/dL)    GFR calc non Af Amer 70 (*) >90 (mL/min)    GFR calc Af Amer 81 (*) >90 (mL/min)   LIPASE, BLOOD     Status: Normal   Collection Time   10/29/11  8:45 AM      Component Value Range Comment   Lipase 37  11 - 59 (U/L)   CULTURE, BLOOD (ROUTINE X 2)     Status: Normal (Preliminary result)   Collection Time   10/29/11 11:03 AM      Component Value Range Comment   Specimen Description BLOOD LEFT ANTECUBITAL      Special Requests        Value: BOTTLES DRAWN AEROBIC AND ANAEROBIC 8CC EACH BOTTLE   Culture PENDING      Report Status PENDING     CULTURE, BLOOD (ROUTINE X 2)     Status: Normal (Preliminary result)   Collection Time   10/29/11 11:04 AM      Component Value Range Comment   Specimen Description BLOOD RIGHT ANTECUBITAL      Special Requests        Value: BOTTLES  DRAWN AEROBIC AND ANAEROBIC 8CC EACH BOTTLE   Culture PENDING      Report Status PENDING       Radiological Exams on Admission: Dg Abd Acute W/chest  10/29/2011  *RADIOLOGY REPORT*  Clinical Data: Diffuse abdominal pain, nausea/vomiting/diarrhea, productive cough  ACUTE  ABDOMEN SERIES (ABDOMEN 2 VIEW & CHEST 1 VIEW)  Comparison: CT chest dated 09/15/2011.  CT abdomen pelvis dated 06/14/2006.  Findings: Bilateral lower lobe opacities, suspicious for pneumonia. No pleural effusion or pneumothorax.  The heart is top normal in size.  Nonobstructive bowel gas pattern.  No evidence of free air under the diaphragm on the upright view.  Cholecystectomy clips.  Surgical clips in the left pelvis.  Visualized osseous structures are within normal limits.  IMPRESSION: Bilateral lower lobe opacities, suspicious for pneumonia.  No evidence of small bowel obstruction or free air.  Original Report Authenticated By: Charline Bills, M.D.    Assessment/Plan Principal Problem:  *CAP (community acquired pneumonia) Active Problems:  Orthostasis  Nausea & vomiting  Tobacco abuse  #1 CAP: patient will be admitted to the hospital. She will be placed on Rocephin and azithromycin for community-acquired coverage.  #2 orthostatic hypotension: In the emergency department while standing her systolic blood pressure dropped to 70s. She has been given about a liter and a half of fluids already. I will continue normal saline at a rate of 125 cc an hour. We will order daily orthostatic vital signs to follow.  #3 nausea and vomiting: I have no doubt that this is related to her pneumonia as well as her bilateral upper quadrant abdominal pain. An acute abdominal series has been done that shows no abdominal pathology.  #4 tobacco abuse: We'll place her on a nicotine patch. Cessation counseling has been provided.  #5 DVT prophylaxis: Lovenox.  Time Spent on Admission: Approximately 60 minutes.  HERNANDEZ  ACOSTA,ESTELA 10/29/2011, 12:35 PM

## 2011-10-29 NOTE — ED Provider Notes (Signed)
History     CSN: [id number] Arrival date & time: 10/29/2011  8:08 AM   First MD Initiated Contact with Patient 10/29/11 0805      Chief Complaint  Patient presents with  . Emesis  . Diarrhea    (Consider location/radiation/quality/duration/timing/severity/associated sxs/prior treatment) HPI Comments: Onset 0500 today.  Vomiting x 3 and diarrhea x 4.  Cough prod of clear phlegm.  Denies SOB.  Denies COPD history but sats in mid/upper 80's on RA.  Has not taken any meds for sxs.  Patient is a 55 y.o. female presenting with vomiting and diarrhea. The history is provided by the patient. No language interpreter was used.  Emesis  This is a new problem. The current episode started 3 to 5 hours ago. The problem has not changed since onset.The emesis has an appearance of bilious material. There has been no fever. Associated symptoms include abdominal pain, cough and diarrhea. Pertinent negatives include no chills and no fever.  Diarrhea The primary symptoms include abdominal pain, nausea, vomiting and diarrhea. Primary symptoms do not include fever.  The illness does not include chills.    History reviewed. No pertinent past medical history.  Past Surgical History  Procedure Date  . Abdominal hysterectomy   . Cholecystectomy     History reviewed. No pertinent family history.  History  Substance Use Topics  . Smoking status: Current Everyday Smoker -- 0.5 packs/day  . Smokeless tobacco: Not on file  . Alcohol Use: No    OB History    Grav Para Term Preterm Abortions TAB SAB Ect Mult Living                  Review of Systems  Constitutional: Negative for fever and chills.  Respiratory: Positive for cough. Negative for shortness of breath, wheezing and stridor.   Cardiovascular: Negative for chest pain and leg swelling.  Gastrointestinal: Positive for nausea, vomiting, abdominal pain and diarrhea. Negative for blood in stool.  Neurological: Positive for light-headedness.    All other systems reviewed and are negative.    Allergies  Review of patient's allergies indicates no known allergies.  Home Medications   Current Outpatient Rx  Name Route Sig Dispense Refill  . ALBUTEROL SULFATE HFA 108 (90 BASE) MCG/ACT IN AERS Inhalation Inhale 2 puffs into the lungs every 4 (four) hours as needed for wheezing. 1 Inhaler 0  . ALBUTEROL SULFATE HFA 108 (90 BASE) MCG/ACT IN AERS Inhalation Inhale 1-2 puffs into the lungs every 4 (four) hours as needed for wheezing. 1 Inhaler 0  . HYDROCOD POLST-CHLORPHEN POLST 10-8 MG/5ML PO LQCR Oral Take 5 mLs by mouth every 12 (twelve) hours as needed. 140 mL 0    BP 112/65  Pulse 82  Temp 98.1 F (36.7 C)  Resp 40  Ht 5\' 7"  (1.702 m)  Wt 130 lb (58.968 kg)  BMI 20.36 kg/m2  SpO2 88%  Physical Exam  Nursing note and vitals reviewed. Constitutional: She is oriented to person, place, and time. She appears well-developed and well-nourished. No distress.  HENT:  Head: Normocephalic and atraumatic.  Eyes: EOM are normal.  Neck: Normal range of motion.  Cardiovascular: Normal rate, regular rhythm and normal heart sounds.   Pulmonary/Chest: Breath sounds normal. No accessory muscle usage. Tachypnea noted. No respiratory distress. She has no decreased breath sounds. She has no wheezes. She has no rales. She exhibits no tenderness.  Abdominal: Soft. Normal appearance and bowel sounds are normal. She exhibits no distension and no mass.  There is no hepatosplenomegaly. There is tenderness in the right upper quadrant, epigastric area and left upper quadrant. There is no rigidity, no rebound, no guarding, no CVA tenderness, no tenderness at McBurney's point and negative Murphy's sign.  Musculoskeletal: Normal range of motion.  Neurological: She is alert and oriented to person, place, and time.  Skin: Skin is warm and dry. She is not diaphoretic.  Psychiatric: She has a normal mood and affect. Judgment normal.    ED Course   Procedures (including critical care time)   Labs Reviewed  CBC  DIFFERENTIAL  COMPREHENSIVE METABOLIC PANEL  LIPASE, BLOOD  URINALYSIS, ROUTINE W REFLEX MICROSCOPIC   No results found.   No diagnosis found.    MDM  Discussed with RN the drop in BP with standing on ortho v/s.  Will bolus with 500 NS.  If well-tolerated will repeat.        Worthy Rancher, PA 10/29/11 [phone]

## 2011-10-30 LAB — CBC
Platelets: 207 10*3/uL (ref 150–400)
RDW: 14.6 % (ref 11.5–15.5)
WBC: 6.7 10*3/uL (ref 4.0–10.5)

## 2011-10-30 LAB — BASIC METABOLIC PANEL
Calcium: 8.5 mg/dL (ref 8.4–10.5)
Chloride: 108 mEq/L (ref 96–112)
Creatinine, Ser: 0.91 mg/dL (ref 0.50–1.10)
GFR calc Af Amer: 82 mL/min — ABNORMAL LOW (ref >90)
Sodium: 139 mEq/L (ref 135–145)

## 2011-10-30 MED ORDER — POTASSIUM CHLORIDE CRYS ER 20 MEQ PO TBCR
40.0000 meq | EXTENDED_RELEASE_TABLET | Freq: Once | ORAL | Status: AC
  Administered 2011-10-30: 40 meq via ORAL
  Filled 2011-10-30: qty 2

## 2011-10-30 NOTE — Progress Notes (Signed)
Notified MD of pts request for a smokeless cigarette.  Md states that this is okay, I will let the patient know.

## 2011-10-30 NOTE — Progress Notes (Signed)
Dr.  Ardyth Harps statedat the patient does not need contact precautions.  The patient has pneumonia.

## 2011-10-30 NOTE — ED Provider Notes (Signed)
Medical screening examination/treatment/procedure(s) were performed by non-physician practitioner and as supervising physician I was immediately available for consultation/collaboration.  Ethelda Chick, MD 10/30/11 [phone]

## 2011-10-30 NOTE — Progress Notes (Signed)
Subjective: is much better, dizziness improved. Is afebrile.  Objective: Vital signs in last 24 hours: Temp:  [98.2 F (36.8 C)-99.3 F (37.4 C)] 98.2 F (36.8 C) (11/12 0600) Pulse Rate:  [64-84] 75  (11/12 0704) Resp:  [20-24] 20  (11/12 0600) BP: (96-126)/(57-78) 119/78 mmHg (11/12 0704) SpO2:  [89 %-99 %] 94 % (11/12 0745) Weight change:  Last BM Date: 10/29/11  Intake/Output from previous day: 11/11 0701 - 11/12 0700 In: 360 [P.O.:360] Out: -      Physical Exam: General: Alert, awake, oriented x3, in no acute distress. HEENT: No bruits, no goiter. Heart: Regular rate and rhythm, without murmurs, rubs, gallops. Lungs: Right basilar rhonchi Abdomen: Soft, nontender, nondistended, positive bowel sounds. Extremities: No clubbing cyanosis or edema with positive pedal pulses. Neuro: Grossly intact, nonfocal.    Lab Results: Basic Metabolic Panel:  Basename 10/30/11 0435 10/29/11 0845  NA 139 142  K 3.3* 3.5  CL 108 107  CO2 25 24  GLUCOSE 88 132*  BUN 6 10  CREATININE 0.91 0.92  CALCIUM 8.5 9.6  MG -- --  PHOS -- --   Liver Function Tests:  [facility] 10/29/11 0845  AST 21  ALT 16  ALKPHOS 99  BILITOT 0.3  PROT 7.3  ALBUMIN 3.7    Basename 10/29/11 0845  LIPASE 37  AMYLASE --   CBC:  Basename 10/30/11 0435 10/29/11 0845  WBC 6.7 12.2*  NEUTROABS -- 10.4*  HGB 11.9* 14.0  HCT 37.0 45.1  MCV 88.7 88.6  PLT 207 246    Recent Results (from the past 240 hour(s))  CULTURE, BLOOD (ROUTINE X 2)     Status: Normal (Preliminary result)   Collection Time   10/29/11 11:03 AM      Component Value Range Status Comment   Specimen Description BLOOD LEFT ANTECUBITAL   Final    Special Requests     Final    Value: BOTTLES DRAWN AEROBIC AND ANAEROBIC 8CC EACH BOTTLE   Culture PENDING   Incomplete    Report Status PENDING   Incomplete   CULTURE, BLOOD (ROUTINE X 2)     Status: Normal (Preliminary result)   Collection Time   10/29/11 11:04 AM   Component Value Range Status Comment   Specimen Description BLOOD RIGHT ANTECUBITAL   Final    Special Requests     Final    Value: BOTTLES DRAWN AEROBIC AND ANAEROBIC 8CC EACH BOTTLE   Culture PENDING   Incomplete    Report Status PENDING   Incomplete     Studies/Results: Dg Abd Acute W/chest  10/29/2011  *RADIOLOGY REPORT*  Clinical Data: Diffuse abdominal pain, nausea/vomiting/diarrhea, productive cough  ACUTE ABDOMEN SERIES (ABDOMEN 2 VIEW & CHEST 1 VIEW)  Comparison: CT chest dated 09/15/2011.  CT abdomen pelvis dated 06/14/2006.  Findings: Bilateral lower lobe opacities, suspicious for pneumonia. No pleural effusion or pneumothorax.  The heart is top normal in size.  Nonobstructive bowel gas pattern.  No evidence of free air under the diaphragm on the upright view.  Cholecystectomy clips.  Surgical clips in the left pelvis.  Visualized osseous structures are within normal limits.  IMPRESSION: Bilateral lower lobe opacities, suspicious for pneumonia.  No evidence of small bowel obstruction or free air.  Original Report Authenticated By: Charline Bills, M.D.    Medications: Scheduled Meds:   . sodium chloride   Intravenous Once  . albuterol  2.5 mg Nebulization TID  . azithromycin  500 mg Intravenous Q24H  . cefTRIAXone (ROCEPHIN)  IV  1 g Intravenous Once  . cefTRIAXone (ROCEPHIN) IV  1 g Intravenous Q24H  . enoxaparin  40 mg Subcutaneous Q24H  . guaiFENesin  600 mg Oral BID  . nicotine  14 mg Transdermal Daily  . DISCONTD: azithromycin  500 mg Intravenous Once   Continuous Infusions:   . sodium chloride 125 mL/hr at 10/29/11 1450  . sodium chloride     PRN Meds:.acetaminophen, acetaminophen, albuterol, ondansetron (ZOFRAN) IV, ondansetron, oxyCODONE, senna  Assessment/Plan:  Principal Problem:  *CAP (community acquired pneumonia) Active Problems:  Orthostasis  Nausea & vomiting  Tobacco abuse   #1 community acquired pneumonia: Continue Rocephin and azithromycin.  Culture data is negative today. He still requiring oxygen. We'll try to titrate off oxygen today. Anticipate discharge home in 24-48 hours.  #2 orthostasis: Resolved. Next  #3 mild hypokalemia: Potassium 3.3 today will repeat orally. Magnesium level is within normal limits.   LOS: 1 day   Virginia Washington,ESTELA 10/30/2011, 9:31 AM

## 2011-10-31 LAB — CBC
Hemoglobin: 11.6 g/dL — ABNORMAL LOW (ref 12.0–15.0)
RBC: 4.1 MIL/uL (ref 3.87–5.11)

## 2011-10-31 MED ORDER — MOXIFLOXACIN HCL 400 MG PO TABS: 400.0000 mg | ORAL_TABLET | Freq: Every day | ORAL | Status: AC

## 2011-10-31 MED ORDER — SODIUM CHLORIDE 0.9 % IN NEBU
INHALATION_SOLUTION | RESPIRATORY_TRACT | Status: AC
  Administered 2011-10-31: 3 mL
  Filled 2011-10-31: qty 3

## 2011-10-31 NOTE — Discharge Summary (Signed)
  Physician Discharge Summary  Patient ID: [name] MRN: [id number] DOB/AGE: 01-14-1956 55 y.o.  Admit date: 10/29/2011 Discharge date: 10/31/2011  Primary Care Physician:  Ara Kussmaul, MD   Discharge Diagnoses:    Principal Problem:  *CAP (community acquired pneumonia) Active Problems:  Orthostasis  Nausea & vomiting  Tobacco abuse    Current Discharge Medication List    START taking these medications   Details  moxifloxacin (AVELOX) 400 MG tablet Take 1 tablet (400 mg total) by mouth daily. Qty: 7 tablet, Refills: 0      CONTINUE these medications which have NOT CHANGED   Details  albuterol (PROVENTIL HFA;VENTOLIN HFA) 108 (90 BASE) MCG/ACT inhaler Inhale 1-2 puffs into the lungs every 4 (four) hours as needed for wheezing. Qty: 1 Inhaler, Refills: 0         Disposition and Follow-up:  Discharge home today in stable and improved condition. I would recommend a repeat chest x-ray in 4-6 weeks to ensure complete resolution of her pneumonia.  Consults:  none    Significant Diagnostic Studies:  Dg Abd Acute W/chest  10/29/2011  *RADIOLOGY REPORT*  Clinical Data: Diffuse abdominal pain, nausea/vomiting/diarrhea, productive cough  ACUTE ABDOMEN SERIES (ABDOMEN 2 VIEW & CHEST 1 VIEW)  Comparison: CT chest dated 09/15/2011.  CT abdomen pelvis dated 06/14/2006.  Findings: Bilateral lower lobe opacities, suspicious for pneumonia. No pleural effusion or pneumothorax.  The heart is top normal in size.  Nonobstructive bowel gas pattern.  No evidence of free air under the diaphragm on the upright view.  Cholecystectomy clips.  Surgical clips in the left pelvis.  Visualized osseous structures are within normal limits.  IMPRESSION: Bilateral lower lobe opacities, suspicious for pneumonia.  No evidence of small bowel obstruction or free air.  Original Report Authenticated By: Charline Bills, M.D.    Brief H and P: For complete details please refer to admission H and P,  but in brief Mrs. Virginia Washington is a pleasant 55 year old black woman who was admitted with dizziness and productive cough found to be orthostatic and to have bilateral lower lobe pneumonia. Because of this he admitted her for further evaluation and management.    Hospital Course:  Principal Problem:  *CAP (community acquired pneumonia) Active Problems:  Orthostasis  Nausea & vomiting  Tobacco abuse   #1 community-acquired pneumonia: She was started on Rocephin and azithromycin. All culture data has been negative to date. Her white count has normalized, she has been afebrile. I will discharge her on 7 more days of Avelox. She will need a repeat chest x-ray in 4-6 weeks to ensure complete resolution of her pneumonia.  #2 orthostatic hypotension: Secondary to GI losses. This has resolved with IV fluid repletion. Most recent orthostatic vital signs are negative.  Time spent on Discharge: greater than 30 minutes.   SignedChaya Jan 10/31/2011, 9:48 AM

## 2011-10-31 NOTE — Progress Notes (Signed)
Discharge Summary: a/o.vss. Up ad lib. Saline lock removed. Discharge instructions given. Prescriptions given. Pt verbalized understanding of instructions. Left floor via wheelchair with family member and nursing staff,.

## 2011-11-04 LAB — CULTURE, BLOOD (ROUTINE X 2): Culture: NO GROWTH

## 2011-12-19 HISTORY — PX: TRANSTHORACIC ECHOCARDIOGRAM: SHX275

## 2012-01-09 ENCOUNTER — Emergency Department (HOSPITAL_COMMUNITY): Payer: Medicare Other

## 2012-01-09 ENCOUNTER — Inpatient Hospital Stay (HOSPITAL_COMMUNITY)
Admission: EM | Admit: 2012-01-09 | Discharge: 2012-01-10 | DRG: 206 | Disposition: A | Discharge status: Home / Self Care | Payer: Medicare Other | Attending: Internal Medicine | Admitting: Internal Medicine

## 2012-01-09 ENCOUNTER — Encounter (HOSPITAL_COMMUNITY): Payer: Self-pay | Admitting: Emergency Medicine

## 2012-01-09 ENCOUNTER — Other Ambulatory Visit: Payer: Self-pay

## 2012-01-09 DIAGNOSIS — R109 Unspecified abdominal pain: Secondary | ICD-10-CM | POA: Diagnosis present

## 2012-01-09 DIAGNOSIS — J438 Other emphysema: Secondary | ICD-10-CM | POA: Diagnosis not present

## 2012-01-09 DIAGNOSIS — I951 Orthostatic hypotension: Secondary | ICD-10-CM

## 2012-01-09 DIAGNOSIS — R911 Solitary pulmonary nodule: Secondary | ICD-10-CM | POA: Diagnosis not present

## 2012-01-09 DIAGNOSIS — R0989 Other specified symptoms and signs involving the circulatory and respiratory systems: Secondary | ICD-10-CM | POA: Diagnosis not present

## 2012-01-09 DIAGNOSIS — R0600 Dyspnea, unspecified: Secondary | ICD-10-CM | POA: Diagnosis present

## 2012-01-09 DIAGNOSIS — I959 Hypotension, unspecified: Secondary | ICD-10-CM | POA: Diagnosis present

## 2012-01-09 DIAGNOSIS — R42 Dizziness and giddiness: Secondary | ICD-10-CM | POA: Diagnosis present

## 2012-01-09 DIAGNOSIS — J4489 Other specified chronic obstructive pulmonary disease: Secondary | ICD-10-CM | POA: Diagnosis present

## 2012-01-09 DIAGNOSIS — R0902 Hypoxemia: Secondary | ICD-10-CM | POA: Diagnosis not present

## 2012-01-09 DIAGNOSIS — J449 Chronic obstructive pulmonary disease, unspecified: Secondary | ICD-10-CM | POA: Diagnosis present

## 2012-01-09 DIAGNOSIS — Z720 Tobacco use: Secondary | ICD-10-CM | POA: Diagnosis present

## 2012-01-09 DIAGNOSIS — E876 Hypokalemia: Secondary | ICD-10-CM | POA: Diagnosis present

## 2012-01-09 DIAGNOSIS — R0609 Other forms of dyspnea: Secondary | ICD-10-CM | POA: Diagnosis not present

## 2012-01-09 DIAGNOSIS — R197 Diarrhea, unspecified: Secondary | ICD-10-CM | POA: Diagnosis present

## 2012-01-09 DIAGNOSIS — R0602 Shortness of breath: Secondary | ICD-10-CM | POA: Diagnosis not present

## 2012-01-09 DIAGNOSIS — M549 Dorsalgia, unspecified: Secondary | ICD-10-CM | POA: Diagnosis present

## 2012-01-09 DIAGNOSIS — R300 Dysuria: Secondary | ICD-10-CM | POA: Diagnosis present

## 2012-01-09 DIAGNOSIS — F17200 Nicotine dependence, unspecified, uncomplicated: Secondary | ICD-10-CM | POA: Diagnosis not present

## 2012-01-09 DIAGNOSIS — I517 Cardiomegaly: Secondary | ICD-10-CM | POA: Diagnosis not present

## 2012-01-09 DIAGNOSIS — Z90710 Acquired absence of both cervix and uterus: Secondary | ICD-10-CM | POA: Diagnosis not present

## 2012-01-09 DIAGNOSIS — J189 Pneumonia, unspecified organism: Secondary | ICD-10-CM | POA: Diagnosis not present

## 2012-01-09 DIAGNOSIS — R112 Nausea with vomiting, unspecified: Secondary | ICD-10-CM

## 2012-01-09 HISTORY — DX: Chronic obstructive pulmonary disease, unspecified: J44.9

## 2012-01-09 LAB — DIFFERENTIAL
Eosinophils Relative: 0 % (ref 0–5)
Lymphocytes Relative: 22 % (ref 12–46)
Lymphs Abs: 2.1 10*3/uL (ref 0.7–4.0)
Monocytes Absolute: 0.3 10*3/uL (ref 0.1–1.0)

## 2012-01-09 LAB — HEPATIC FUNCTION PANEL
AST: 13 U/L (ref 0–37)
Albumin: 3.6 g/dL (ref 3.5–5.2)
Alkaline Phosphatase: 91 U/L (ref 39–117)
Total Bilirubin: 0.3 mg/dL (ref 0.3–1.2)

## 2012-01-09 LAB — LIPASE, BLOOD: Lipase: 28 U/L (ref 11–59)

## 2012-01-09 LAB — CARDIAC PANEL(CRET KIN+CKTOT+MB+TROPI): Relative Index: INVALID (ref 0.0–2.5)

## 2012-01-09 LAB — BASIC METABOLIC PANEL
CO2: 25 mEq/L (ref 19–32)
Calcium: 9.5 mg/dL (ref 8.4–10.5)
Creatinine, Ser: 0.91 mg/dL (ref 0.50–1.10)
Glucose, Bld: 122 mg/dL — ABNORMAL HIGH (ref 70–99)
Sodium: 140 mEq/L (ref 135–145)

## 2012-01-09 LAB — URINALYSIS, ROUTINE W REFLEX MICROSCOPIC
Bilirubin Urine: NEGATIVE
Glucose, UA: NEGATIVE mg/dL
Hgb urine dipstick: NEGATIVE
Ketones, ur: NEGATIVE mg/dL
Leukocytes, UA: NEGATIVE
pH: 5 (ref 5.0–8.0)

## 2012-01-09 LAB — CBC
HCT: 42.1 % (ref 36.0–46.0)
Hemoglobin: 13.7 g/dL (ref 12.0–15.0)
MCV: 86.6 fL (ref 78.0–100.0)
RBC: 4.86 MIL/uL (ref 3.87–5.11)
WBC: 9.3 10*3/uL (ref 4.0–10.5)

## 2012-01-09 LAB — RAPID URINE DRUG SCREEN, HOSP PERFORMED
Amphetamines: NOT DETECTED
Barbiturates: NOT DETECTED
Benzodiazepines: NOT DETECTED

## 2012-01-09 LAB — PRO B NATRIURETIC PEPTIDE: Pro B Natriuretic peptide (BNP): 49.1 pg/mL (ref 0–125)

## 2012-01-09 MED ORDER — ALBUTEROL SULFATE (5 MG/ML) 0.5% IN NEBU: 2.5000 mg | INHALATION_SOLUTION | RESPIRATORY_TRACT | Status: DC | PRN

## 2012-01-09 MED ORDER — ONDANSETRON HCL 4 MG PO TABS: 4.0000 mg | ORAL_TABLET | Freq: Four times a day (QID) | ORAL | Status: DC | PRN

## 2012-01-09 MED ORDER — POTASSIUM CHLORIDE IN NACL 40-0.9 MEQ/L-% IV SOLN
INTRAVENOUS | Status: DC
  Filled 2012-01-09 (×3): qty 1000

## 2012-01-09 MED ORDER — SODIUM CHLORIDE 0.9 % IV SOLN
INTRAVENOUS | Status: AC
  Administered 2012-01-09: 1000 mL via INTRAVENOUS

## 2012-01-09 MED ORDER — IOHEXOL 350 MG/ML SOLN: 100.0000 mL | Freq: Once | INTRAVENOUS | Status: DC | PRN

## 2012-01-09 MED ORDER — ONDANSETRON HCL 4 MG/2ML IJ SOLN: 4.0000 mg | Freq: Three times a day (TID) | INTRAMUSCULAR | Status: DC | PRN

## 2012-01-09 MED ORDER — ENOXAPARIN SODIUM 40 MG/0.4ML ~~LOC~~ SOLN
40.0000 mg | Freq: Every day | SUBCUTANEOUS | Status: DC
  Administered 2012-01-09 – 2012-01-10 (×2): 40 mg via SUBCUTANEOUS
  Filled 2012-01-09 (×2): qty 0.4

## 2012-01-09 MED ORDER — IOHEXOL 350 MG/ML SOLN
100.0000 mL | Freq: Once | INTRAVENOUS | Status: AC | PRN
  Administered 2012-01-09: 100 mL via INTRAVENOUS

## 2012-01-09 MED ORDER — ALUM & MAG HYDROXIDE-SIMETH 200-200-20 MG/5ML PO SUSP: 30.0000 mL | Freq: Four times a day (QID) | ORAL | Status: DC | PRN

## 2012-01-09 MED ORDER — HYDROMORPHONE HCL PF 1 MG/ML IJ SOLN: 0.5000 mg | INTRAMUSCULAR | Status: DC | PRN

## 2012-01-09 MED ORDER — ACETAMINOPHEN 650 MG RE SUPP: 650.0000 mg | Freq: Four times a day (QID) | RECTAL | Status: DC | PRN

## 2012-01-09 MED ORDER — ACETAMINOPHEN 325 MG PO TABS
650.0000 mg | ORAL_TABLET | Freq: Four times a day (QID) | ORAL | Status: DC | PRN
  Administered 2012-01-09: 650 mg via ORAL
  Filled 2012-01-09: qty 2

## 2012-01-09 MED ORDER — ALBUTEROL SULFATE (5 MG/ML) 0.5% IN NEBU
2.5000 mg | INHALATION_SOLUTION | RESPIRATORY_TRACT | Status: DC | PRN
  Administered 2012-01-09: 2.5 mg via RESPIRATORY_TRACT
  Filled 2012-01-09: qty 0.5

## 2012-01-09 MED ORDER — FUROSEMIDE 40 MG PO TABS
40.0000 mg | ORAL_TABLET | Freq: Once | ORAL | Status: AC
  Administered 2012-01-09: 40 mg via ORAL
  Filled 2012-01-09: qty 1

## 2012-01-09 MED ORDER — ONDANSETRON HCL 4 MG/2ML IJ SOLN
4.0000 mg | Freq: Four times a day (QID) | INTRAMUSCULAR | Status: DC | PRN
  Administered 2012-01-09: 4 mg via INTRAVENOUS
  Filled 2012-01-09: qty 2

## 2012-01-09 MED ORDER — HYDROCODONE-ACETAMINOPHEN 5-325 MG PO TABS
1.0000 | ORAL_TABLET | ORAL | Status: DC | PRN
Start: 2012-01-09 — End: 2012-01-10
  Administered 2012-01-09 – 2012-01-10 (×3): 1 via ORAL
  Filled 2012-01-09 (×3): qty 1

## 2012-01-09 MED ORDER — GUAIFENESIN-DM 100-10 MG/5ML PO SYRP: 5.0000 mL | ORAL_SOLUTION | ORAL | Status: DC | PRN

## 2012-01-09 MED ORDER — TRAZODONE HCL 50 MG PO TABS: 25.0000 mg | ORAL_TABLET | Freq: Every evening | ORAL | Status: DC | PRN

## 2012-01-09 MED ORDER — SODIUM CHLORIDE 0.9 % IV SOLN
INTRAVENOUS | Status: DC
  Administered 2012-01-09 – 2012-01-10 (×3): via INTRAVENOUS
  Filled 2012-01-09 (×3): qty 1000

## 2012-01-09 MED ORDER — ALBUTEROL SULFATE (5 MG/ML) 0.5% IN NEBU
2.5000 mg | INHALATION_SOLUTION | Freq: Three times a day (TID) | RESPIRATORY_TRACT | Status: DC
  Administered 2012-01-09 – 2012-01-10 (×4): 2.5 mg via RESPIRATORY_TRACT
  Filled 2012-01-09 (×4): qty 0.5

## 2012-01-09 MED ORDER — SODIUM CHLORIDE 0.9 % IN NEBU
INHALATION_SOLUTION | RESPIRATORY_TRACT | Status: AC
  Administered 2012-01-09: 3 mL
  Filled 2012-01-09: qty 3

## 2012-01-09 NOTE — ED Notes (Signed)
Patient refusing ABG per resp therapist. EDP notified.

## 2012-01-09 NOTE — ED Notes (Signed)
Pt c/o sudden onset of sob this am.

## 2012-01-09 NOTE — H&P (Signed)
Hospital Admission Note Date: 01/09/2012  Patient name: Virginia Washington Medical record number: [id number] Date of birth: 03/27/1956 Age: 56 y.o. Gender: female PCP: Ara Kussmaul, MD, MD  Chief Complaint: Dizzy  History of Present Illness:  Virginia Washington is an 57 y.o. female smoker who presents with sudden onset dizziness. She also felt short of breath. Her oxygen saturations were reportedly in the mid 80s upon arrival to the emergency room. Her systolic blood pressure was reportedly in the 80s as well. She was driving when it occurred. She has had vague cough. Her appetite has been normal. She had several loose stools today. She's had chills. No sick contacts. She is complaining of upper and lower abdominal pain. She has lower back pain. After IV fluids and oxygen therapy, her blood pressure normalized. She came up to the step down unit on a nonrebreather mask. I've taken it off in her oxygen saturations are 93% on room air. She smokes 8 cigarettes a day. She denies alcohol or drug use. She feels pain across her chest. CT scan of the chest showed no pulmonary embolus. Pulmonary edema was present. No infiltrate. She received a dose of Lasix in the emergency room in addition to the IV fluids.  Past Medical History  Diagnosis Date  . COPD (chronic obstructive pulmonary disease)    hemorrhoids  Meds: Prescriptions prior to admission  Medication Sig Dispense Refill  . albuterol (PROVENTIL HFA;VENTOLIN HFA) 108 (90 BASE) MCG/ACT inhaler Inhale 1-2 puffs into the lungs every 4 (four) hours as needed. Shortness of breath        Allergies: Review of patient's allergies indicates no known allergies. History   Social History  . Marital Status: Widowed    Spouse Name: N/A    Number of Children: N/A  . Years of Education: N/A   Occupational History  .  patient delivers newspapers    Social History Main Topics  . Smoking status: Current Everyday Smoker -- 0.5 packs/day  . Smokeless  tobacco: Not on file  . Alcohol Use: No  . Drug Use: No  . Sexually Active:     No family history on file. Past Surgical History  Procedure Date  . Abdominal hysterectomy   . Cholecystectomy     Review of Systems: Systems reviewed and as per HPI, otherwise negative.  Physical Exam: Blood pressure 133/77, pulse 96, temperature 98 F (36.7 C), temperature source Oral, resp. rate 25, height 5\' 4"  (1.626 m), weight 56.3 kg (124 lb 1.9 oz), SpO2 97.00%. BP 133/77  Pulse 96  Temp(Src) 98 F (36.7 C) (Oral)  Resp 25  Ht 5\' 4"  (1.626 m)  Wt 56.3 kg (124 lb 1.9 oz)  BMI 21.30 kg/m2  SpO2 97%  General Appearance:    Alert, cooperative, appears uncomfortable, appears much older than stated age  Head:    Normocephalic, without obvious abnormality, atraumatic  Eyes:    PERRL, conjunctiva/corneas clear, EOM's intact, fundi    benign, both eyes  Ears:    Normal TM's and external ear canals, both ears  Nose:   Nares normal, septum midline, mucosa normal, no drainage    or sinus tenderness  Throat:   Lips, mucosa, and tongue normal; poor dentition. Slightly dry mucous membranes.  Neck:   Supple, symmetrical, trachea midline, no adenopathy;    thyroid:  no enlargement/tenderness/nodules; no carotid   bruit or JVD  Back:     Symmetric, no curvature, ROM normal, no CVA tenderness  Lungs:  no wheezing. Bronchial breath sounds at the bases, no accessory muscle use   Chest Wall:    No tenderness or deformity   Heart:    Regular rate and rhythm, S1 and S2 normal, no murmur, rub   or gallop  Breast Exam:    deferred   Abdomen:     normal bowel sounds. Minimal tenderness. Nondistended. Soft.   Genitalia:   deferred   Rectal:   deferred   Extremities:   Extremities normal, atraumatic, no cyanosis or edema  Pulses:   2+ and symmetric all extremities  Skin:   Skin color, texture, turgor normal, no rashes or lesions  Lymph nodes:   Cervical, supraclavicular, and axillary nodes normal    Neurologic:   CNII-XII intact, normal strength, sensation and reflexes    throughout    Psychiatric: Normal affect. Cooperative.  Lab results: Basic Metabolic Panel:  Basename 01/09/12 1133  NA 140  K 3.3*  CL 107  CO2 25  GLUCOSE 122*  BUN 8  CREATININE 0.91  CALCIUM 9.5  MG --  PHOS --   Liver Function Tests: No results found for this basename: AST:2,ALT:2,ALKPHOS:2,BILITOT:2,PROT:2,ALBUMIN:2 in the last 72 hours No results found for this basename: LIPASE:2,AMYLASE:2 in the last 72 hours No results found for this basename: AMMONIA:2 in the last 72 hours CBC:  Basename 01/09/12 1133  WBC 9.3  NEUTROABS 6.9  HGB 13.7  HCT 42.1  MCV 86.6  PLT 184   troponin 0  Imaging  results:  Ct Angio Chest W/cm &/or Wo Cm  01/09/2012  *RADIOLOGY REPORT*  Clinical Data: Sudden onset of dyspnea.  History of smoking, hysterectomy, cholecystectomy.  CT ANGIOGRAPHY CHEST  Technique:  Multidetector CT imaging of the chest using the standard protocol during bolus administration of intravenous contrast. Multiplanar reconstructed images including MIPs were obtained and reviewed to evaluate the vascular anatomy.  Contrast: OMNIPAQUE IOHEXOL 350 MG/ML IV SOLN, 1 OMNIPAQUE IOHEXOL 350 MG/ML IV SOLN  Comparison: 09/15/2011  Findings: The pulmonary arteries are well opacified.  There is no evidence for acute pulmonary embolus.  The heart is mildly enlarged.  There are dependent changes consistent with mild pulmonary edema.  No significant mediastinal, hilar, or axillary adenopathy.  Note is made of small nodule within the minor fissure, stable in appearance and measuring approximately 5 mm on image 47 of series 7.  Images of the upper abdomen are unremarkable. Visualized osseous structures have a normal appearance.  IMPRESSION:  1.  Technically adequate exam showing no evidence for acute pulmonary embolus. 2.  Stable intrafissural nodule, likely a small lymph node on the right. 3.  Dependent changes  consistent with mild pulmonary edema.  Original Report Authenticated By: Patterson Hammersmith, M.D.   Dg Chest Portable 1 View  01/09/2012  *RADIOLOGY REPORT*  Clinical Data: Shortness of breath.  PORTABLE CHEST - 1 VIEW  Comparison: 09/15/2011  Findings: Emphysema noted. Cardiac and mediastinal contours appear unremarkable.  Faint interstitial accentuation of the lung bases may be due to soft tissues of the chest wall but could also be seen in the setting of drug reaction or atypical pneumonia.  No compelling signs of edema.  IMPRESSION:  1.  Emphysema. 2.  Faint interstitial accentuation in the lung bases, at least partially due to distribution of soft tissues of the chest wall. The possibility of atypical pneumonia or drug reaction is also raised.  Original Report Authenticated By: Dellia Cloud, M.D.    Assessment & Plan: Principal Problem:  *  Hypoxia Active Problems:  Hypotension  Dyspnea  Diarrhea  Dysuria  Abdominal pain  Tobacco abuse  Back pain  Hypokalemia   her hypoxia and hypotension has resolved. Will check a urinalysis. She may have UTI with sepsis. She's not febrile and has no leukocytosis however. She is being monitored in the step down unit. Check a lipase and liver function tests as she complains of some vague abdominal pain. Check stool for C. difficile. She was hospitalized for pneumonia and received antibiotics a few months ago. She's not wheezing but she is a smoker and may have had a COPD exacerbation. I will give bronchodilators. She has pulmonary edema on CAT scan but BNP is normal. Will check echocardiogram for right and left ventricular function. She's not been on steroids recently, so doubt adrenal crisis. Replete potassium. Check TSH.  Mylon Mabey L 01/09/2012, 4:06 PM

## 2012-01-09 NOTE — ED Provider Notes (Signed)
History    This chart was scribed for Nelia Shi, MD, MD by Smitty Pluck. The patient was seen in room APA10 and the patient's care was started at 11:22AM.   CSN: [id number]  Arrival date & time 01/09/12  1108   First MD Initiated Contact with Patient 01/09/12 1116      No chief complaint on file.   (Consider location/radiation/quality/duration/timing/severity/associated sxs/prior treatment) The history is provided by the patient.   [place]ia [place] is a 56 y.o. female who presents to the Emergency Department complaining of SOB onset today. Pt reports that the symptoms started all at once. She was traveling in her car and had to pull over due to dizziness. The symptoms have been constant since onset without radiation. Pt denies fever history of blood clot and swelling in legs. Pt is a current smoker (0.5 packs/day).   History reviewed. No pertinent past medical history.  Past Surgical History  Procedure Date  . Abdominal hysterectomy   . Cholecystectomy     No family history on file.  History  Substance Use Topics  . Smoking status: Current Everyday Smoker -- 0.5 packs/day  . Smokeless tobacco: Not on file  . Alcohol Use: No    OB History    Grav Para Term Preterm Abortions TAB SAB Ect Mult Living                  Review of Systems  All other systems reviewed and are negative.   10 Systems reviewed and are negative for acute change except as noted in the HPI.  Allergies  Review of patient's allergies indicates no known allergies.  Home Medications   Current Outpatient Rx  Name Route Sig Dispense Refill  . ALBUTEROL SULFATE HFA 108 (90 BASE) MCG/ACT IN AERS Inhalation Inhale 1-2 puffs into the lungs every 4 (four) hours as needed. Shortness of breath      BP 112/61  Pulse 86  Temp(Src) 98.1 F (36.7 C) (Rectal)  Resp 22  Ht 5\' 4"  (1.626 m)  SpO2 93%  Physical Exam  Nursing note and vitals reviewed. Constitutional: She is oriented to person,  place, and time. She appears well-developed and well-nourished.  HENT:  Head: Normocephalic and atraumatic.  Eyes: Conjunctivae and EOM are normal. Pupils are equal, round, and reactive to light.  Neck: Neck supple. No thyromegaly present.  Cardiovascular: Normal rate, regular rhythm and normal heart sounds.   Pulmonary/Chest: She has wheezes (fine bilaterally ). She exhibits tenderness.  Abdominal: Bowel sounds are normal. She exhibits no mass. There is tenderness.  Musculoskeletal: Normal range of motion.  Neurological: She is alert and oriented to person, place, and time.  Skin: Skin is warm and dry.  Psychiatric: She has a normal mood and affect. Her behavior is normal.    ED Course  Procedures (including critical care time) CRITICAL CARE Performed by: Nelva Nay L   Total critical care time: 30 min  Critical care time was exclusive of separately billable procedures and treating other patients.  Critical care was necessary to treat or prevent imminent or life-threatening deterioration.  Critical care was time spent personally by me on the following activities: development of treatment plan with patient and/or surrogate as well as nursing, discussions with consultants, evaluation of patient's response to treatment, examination of patient, obtaining history from patient or surrogate, ordering and performing treatments and interventions, ordering and review of laboratory studies, ordering and review of radiographic studies, pulse oximetry and re-evaluation of patient's condition.  DIAGNOSTIC STUDIES: Oxygen Saturation is 84% on [place], .    COORDINATION OF CARE:  11:25AM EDP discusses treatment plan for pt in ED    Labs Reviewed  BASIC METABOLIC PANEL - Abnormal; Notable for the following:    Potassium 3.3 (*)    Glucose, Bld 122 (*)    GFR calc non Af Amer 71 (*)    GFR calc Af Amer 82 (*)    All other components within normal limits  CBC  DIFFERENTIAL  POCT I-STAT  TROPONIN I  I-STAT TROPONIN I  PRO B NATRIURETIC PEPTIDE   Ct Angio Chest W/cm &/or Wo Cm  01/09/2012  *RADIOLOGY REPORT*  Clinical Data: Sudden onset of dyspnea.  History of smoking, hysterectomy, cholecystectomy.  CT ANGIOGRAPHY CHEST  Technique:  Multidetector CT imaging of the chest using the standard protocol during bolus administration of intravenous contrast. Multiplanar reconstructed images including MIPs were obtained and reviewed to evaluate the vascular anatomy.  Contrast: OMNIPAQUE IOHEXOL 350 MG/ML IV SOLN, 1 OMNIPAQUE IOHEXOL 350 MG/ML IV SOLN  Comparison: 09/15/2011  Findings: The pulmonary arteries are well opacified.  There is no evidence for acute pulmonary embolus.  The heart is mildly enlarged.  There are dependent changes consistent with mild pulmonary edema.  No significant mediastinal, hilar, or axillary adenopathy.  Note is made of small nodule within the minor fissure, stable in appearance and measuring approximately 5 mm on image 47 of series 7.  Images of the upper abdomen are unremarkable. Visualized osseous structures have a normal appearance.  IMPRESSION:  1.  Technically adequate exam showing no evidence for acute pulmonary embolus. 2.  Stable intrafissural nodule, likely a small lymph node on the right. 3.  Dependent changes consistent with mild pulmonary edema.  Original Report Authenticated By: Patterson Hammersmith, M.D.   Dg Chest Portable 1 View  01/09/2012  *RADIOLOGY REPORT*  Clinical Data: Shortness of breath.  PORTABLE CHEST - 1 VIEW  Comparison: 09/15/2011  Findings: Emphysema noted. Cardiac and mediastinal contours appear unremarkable.  Faint interstitial accentuation of the lung bases may be due to soft tissues of the chest wall but could also be seen in the setting of drug reaction or atypical pneumonia.  No compelling signs of edema.  IMPRESSION:  1.  Emphysema. 2.  Faint interstitial accentuation in the lung bases, at least partially due to distribution of  soft tissues of the chest wall. The possibility of atypical pneumonia or drug reaction is also raised.  Original Report Authenticated By: Dellia Cloud, M.D.     1. Hypoxemia   2. Hypotension       MDM  I personally performed the services described in this documentation, which was scribed in my presence. The recorded information has been reviewed and considered.   Nelia Shi, MD 01/09/12 [phone]

## 2012-01-10 DIAGNOSIS — R0902 Hypoxemia: Secondary | ICD-10-CM | POA: Diagnosis not present

## 2012-01-10 DIAGNOSIS — F17200 Nicotine dependence, unspecified, uncomplicated: Secondary | ICD-10-CM | POA: Diagnosis not present

## 2012-01-10 DIAGNOSIS — I959 Hypotension, unspecified: Secondary | ICD-10-CM | POA: Diagnosis not present

## 2012-01-10 DIAGNOSIS — R300 Dysuria: Secondary | ICD-10-CM | POA: Diagnosis not present

## 2012-01-10 DIAGNOSIS — I517 Cardiomegaly: Secondary | ICD-10-CM

## 2012-01-10 NOTE — Progress Notes (Signed)
States understanding of discharge repeats back when to call md.

## 2012-01-10 NOTE — Progress Notes (Addendum)
Subjective: Some back and leg pain.  No dyspnea, cough, N/V/D. Ate well this am.  Objective: Vital signs in last 24 hours: Filed Vitals:   01/10/12 0700 01/10/12 0800 01/10/12 0814 01/10/12 0900  BP: 141/73 109/67  115/72  Pulse: 64 67  65  Temp:  98 F (36.7 C)    TempSrc:  Oral    Resp: 14 11  19   Height:      Weight:      SpO2: 97% 97% 100% 99%   Weight change:   Intake/Output Summary (Last 24 hours) at 01/10/12 0920 Last data filed at 01/10/12 0800  Gross per 24 hour  Intake   3097 ml  Output   1800 ml  Net   1297 ml   Physical Exam: Unchanged from 01/09/2012  Lab Results: Basic Metabolic Panel:  Lab 01/09/12 6962  NA 140  K 3.3*  CL 107  CO2 25  GLUCOSE 122*  BUN 8  CREATININE 0.91  CALCIUM 9.5  MG --  PHOS --   Liver Function Tests:  Lab 01/09/12 1133  AST 13  ALT 8  ALKPHOS 91  BILITOT 0.3  PROT 6.5  ALBUMIN 3.6    Lab 01/09/12 1133  LIPASE 28  AMYLASE --   No results found for this basename: AMMONIA:2 in the last 168 hours CBC:  Lab 01/09/12 1133  WBC 9.3  NEUTROABS 6.9  HGB 13.7  HCT 42.1  MCV 86.6  PLT 184   Cardiac Enzymes:  Lab 01/09/12 1725  CKTOTAL 89  CKMB 1.1  CKMBINDEX --  TROPONINI <0.30   BNP:  Lab 01/09/12 1133  PROBNP 49.1   D-Dimer: No results found for this basename: DDIMER:2 in the last 168 hours CBG: No results found for this basename: GLUCAP:6 in the last 168 hours Hemoglobin A1C: No results found for this basename: HGBA1C in the last 168 hours Fasting Lipid Panel: No results found for this basename: CHOL,HDL,LDLCALC,TRIG,CHOLHDL,LDLDIRECT in the last 952 hours Thyroid Function Tests:  Lab 01/09/12 1133  TSH 0.985  T4TOTAL --  FREET4 --  T3FREE --  THYROIDAB --   Coagulation: No results found for this basename: LABPROT:4,INR:4 in the last 168 hours Anemia Panel: No results found for this basename: VITAMINB12,FOLATE,FERRITIN,TIBC,IRON,RETICCTPCT in the last 168 hours Urine Drug  Screen: Drugs of Abuse     Component Value Date/Time   LABOPIA NONE DETECTED 01/09/2012 1605   COCAINSCRNUR NONE DETECTED 01/09/2012 1605   LABBENZ NONE DETECTED 01/09/2012 1605   AMPHETMU NONE DETECTED 01/09/2012 1605   THCU NONE DETECTED 01/09/2012 1605   LABBARB NONE DETECTED 01/09/2012 1605    Alcohol Level: No results found for this basename: ETH:2 in the last 168 hours Urinalysis: Negative  Micro Results: Recent Results (from the past 240 hour(s))  MRSA PCR SCREENING     Status: Normal   Collection Time   01/09/12  3:07 PM      Component Value Range Status Comment   MRSA by PCR NEGATIVE  NEGATIVE  Final    Studies/Results: Ct Angio Chest W/cm &/or Wo Cm  01/09/2012  *RADIOLOGY REPORT*  Clinical Data: Sudden onset of dyspnea.  History of smoking, hysterectomy, cholecystectomy.  CT ANGIOGRAPHY CHEST  Technique:  Multidetector CT imaging of the chest using the standard protocol during bolus administration of intravenous contrast. Multiplanar reconstructed images including MIPs were obtained and reviewed to evaluate the vascular anatomy.  Contrast: OMNIPAQUE IOHEXOL 350 MG/ML IV SOLN, 1 OMNIPAQUE IOHEXOL 350 MG/ML IV SOLN  Comparison: 09/15/2011  Findings: The pulmonary arteries are well opacified.  There is no evidence for acute pulmonary embolus.  The heart is mildly enlarged.  There are dependent changes consistent with mild pulmonary edema.  No significant mediastinal, hilar, or axillary adenopathy.  Note is made of small nodule within the minor fissure, stable in appearance and measuring approximately 5 mm on image 47 of series 7.  Images of the upper abdomen are unremarkable. Visualized osseous structures have a normal appearance.  IMPRESSION:  1.  Technically adequate exam showing no evidence for acute pulmonary embolus. 2.  Stable intrafissural nodule, likely a small lymph node on the right. 3.  Dependent changes consistent with mild pulmonary edema.  Original Report Authenticated  By: Patterson Hammersmith, M.D.   Dg Chest Portable 1 View  01/09/2012  *RADIOLOGY REPORT*  Clinical Data: Shortness of breath.  PORTABLE CHEST - 1 VIEW  Comparison: 09/15/2011  Findings: Emphysema noted. Cardiac and mediastinal contours appear unremarkable.  Faint interstitial accentuation of the lung bases may be due to soft tissues of the chest wall but could also be seen in the setting of drug reaction or atypical pneumonia.  No compelling signs of edema.  IMPRESSION:  1.  Emphysema. 2.  Faint interstitial accentuation in the lung bases, at least partially due to distribution of soft tissues of the chest wall. The possibility of atypical pneumonia or drug reaction is also raised.  Original Report Authenticated By: Dellia Cloud, M.D.   Scheduled Meds:   . sodium chloride   Intravenous STAT  . albuterol  2.5 mg Nebulization TID  . enoxaparin  40 mg Subcutaneous Daily  . furosemide  40 mg Oral Once  . sodium chloride       Continuous Infusions:   . DISCONTD: 0.9 % NaCl with KCl 40 mEq / [place] Stopped (01/09/12 1648)  . DISCONTD: sodium chloride 0.9 % 1,000 mL with potassium chloride 40 mEq infusion 125 mL/hr at 01/10/12 0847   PRN Meds:.acetaminophen, albuterol, alum & mag hydroxide-simeth, guaiFENesin-dextromethorphan, HYDROcodone-acetaminophen, HYDROmorphone, iohexol, ondansetron (ZOFRAN) IV, ondansetron, traZODone, DISCONTD: acetaminophen, DISCONTD: albuterol, DISCONTD: albuterol, DISCONTD: iohexol, DISCONTD: ondansetron (ZOFRAN) IV Assessment/Plan: Principal Problem:  *Hypoxia, resolved  Active Problems:  Hypotension, resolved  Dyspnea, resolved  Diarrhea, resolved  Dysuria, no evidence of UTI  Abdominal pain, improved. LFTs and lipase normal.  Tobacco abuse  Back pain  Hypokalemia  Transferred to telemetry. Stop IV fluids. Increase activity. Followup echocardiogram. His echo is normal, we'll discharge home.   LOS: 1 day   Virginia Washington 01/10/2012, 9:20 AM

## 2012-01-10 NOTE — Progress Notes (Signed)
*  PRELIMINARY RESULTS* Echocardiogram 2D Echocardiogram has been performed.  Conrad Elmwood 01/10/2012, 9:19 AM

## 2012-01-10 NOTE — Progress Notes (Signed)
UR Chart Review Completed  

## 2012-01-10 NOTE — Discharge Summary (Signed)
Physician Discharge Summary  Patient ID: Virginia Washington MRN: [id number] DOB/AGE: [birth year] 54 y.o.  Admit date: 01/09/2012 Discharge date: 01/10/2012  Discharge Diagnoses:  Principal Problem:  *Hypoxia Active Problems:  Hypotension  Dyspnea  Diarrhea  Dysuria  Abdominal pain  Tobacco abuse  Back pain  Hypokalemia   Medication List  As of 01/10/2012  2:52 PM   TAKE these medications         albuterol 108 (90 BASE) MCG/ACT inhaler   Commonly known as: PROVENTIL HFA;VENTOLIN HFA   Inhale 1-2 puffs into the lungs every 4 (four) hours as needed. Shortness of breath            Discharge Orders    Future Orders Please Complete By Expires   Diet general      Activity as tolerated - No restrictions         Follow-up Information    Follow up with Ara Kussmaul, MD. (If symptoms worsen)          Disposition: Home or Self Care  Discharged Condition: stable  Consults:  none  Labs:   Results for orders placed during the hospital encounter of 01/09/12 (from the past 48 hour(s))  POCT I-STAT TROPONIN I     Status: Normal   Collection Time   01/09/12 11:25 AM      Component Value Range Comment   Troponin i, poc 0.00  0.00 - 0.08 (ng/mL)    Comment 3            CBC     Status: Normal   Collection Time   01/09/12 11:33 AM      Component Value Range Comment   WBC 9.3  4.0 - 10.5 (K/uL)    RBC 4.86  3.87 - 5.11 (MIL/uL)    Hemoglobin 13.7  12.0 - 15.0 (g/dL)    HCT 40.9  81.1 - 91.4 (%)    MCV 86.6  78.0 - 100.0 (fL)    MCH 28.2  26.0 - 34.0 (pg)    MCHC 32.5  30.0 - 36.0 (g/dL)    RDW 78.2  95.6 - 21.3 (%)    Platelets 184  150 - 400 (K/uL)   DIFFERENTIAL     Status: Normal   Collection Time   01/09/12 11:33 AM      Component Value Range Comment   Neutrophils Relative 75  43 - 77 (%)    Neutro Abs 6.9  1.7 - 7.7 (K/uL)    Lymphocytes Relative 22  12 - 46 (%)    Lymphs Abs 2.1  0.7 - 4.0 (K/uL)    Monocytes Relative 3  3 - 12 (%)    Monocytes Absolute 0.3   0.1 - 1.0 (K/uL)    Eosinophils Relative 0  0 - 5 (%)    Eosinophils Absolute 0.0  0.0 - 0.7 (K/uL)    Basophils Relative 0  0 - 1 (%)    Basophils Absolute 0.0  0.0 - 0.1 (K/uL)   BASIC METABOLIC PANEL     Status: Abnormal   Collection Time   01/09/12 11:33 AM      Component Value Range Comment   Sodium 140  135 - 145 (mEq/L)    Potassium 3.3 (*) 3.5 - 5.1 (mEq/L)    Chloride 107  96 - 112 (mEq/L)    CO2 25  19 - 32 (mEq/L)    Glucose, Bld 122 (*) 70 - 99 (mg/dL)    BUN 8  6 -  23 (mg/dL)    Creatinine, Ser 0.86  0.50 - 1.10 (mg/dL)    Calcium 9.5  8.4 - 10.5 (mg/dL)    GFR calc non Af Amer 71 (*) >90 (mL/min)    GFR calc Af Amer 82 (*) >90 (mL/min)   PRO B NATRIURETIC PEPTIDE     Status: Normal   Collection Time   01/09/12 11:33 AM      Component Value Range Comment   Pro B Natriuretic peptide (BNP) 49.1  0 - 125 (pg/mL)   HEPATIC FUNCTION PANEL     Status: Normal   Collection Time   01/09/12 11:33 AM      Component Value Range Comment   Total Protein 6.5  6.0 - 8.3 (g/dL)    Albumin 3.6  3.5 - 5.2 (g/dL)    AST 13  0 - 37 (U/L)    ALT 8  0 - 35 (U/L)    Alkaline Phosphatase 91  39 - 117 (U/L)    Total Bilirubin 0.3  0.3 - 1.2 (mg/dL)    Bilirubin, Direct <5.7  0.0 - 0.3 (mg/dL) RESULT REPEATED AND VERIFIED   Indirect Bilirubin NOT CALCULATED  0.3 - 0.9 (mg/dL)   TSH     Status: Normal   Collection Time   01/09/12 11:33 AM      Component Value Range Comment   TSH 0.985  0.350 - 4.500 (uIU/mL)   LIPASE, BLOOD     Status: Normal   Collection Time   01/09/12 11:33 AM      Component Value Range Comment   Lipase 28  11 - 59 (U/L)   MRSA PCR SCREENING     Status: Normal   Collection Time   01/09/12  3:07 PM      Component Value Range Comment   MRSA by PCR NEGATIVE  NEGATIVE    URINALYSIS, ROUTINE W REFLEX MICROSCOPIC     Status: Normal   Collection Time   01/09/12  4:05 PM      Component Value Range Comment   Color, Urine YELLOW  YELLOW     APPearance CLEAR  CLEAR      Specific Gravity, Urine 1.010  1.005 - 1.030     pH 5.0  5.0 - 8.0     Glucose, UA NEGATIVE  NEGATIVE (mg/dL)    Hgb urine dipstick NEGATIVE  NEGATIVE     Bilirubin Urine NEGATIVE  NEGATIVE     Ketones, ur NEGATIVE  NEGATIVE (mg/dL)    Protein, ur NEGATIVE  NEGATIVE (mg/dL)    Urobilinogen, UA 0.2  0.0 - 1.0 (mg/dL)    Nitrite NEGATIVE  NEGATIVE     Leukocytes, UA NEGATIVE  NEGATIVE  MICROSCOPIC NOT DONE ON URINES WITH NEGATIVE PROTEIN, BLOOD, LEUKOCYTES, NITRITE, OR GLUCOSE <1000 mg/dL.  URINE RAPID DRUG SCREEN (HOSP PERFORMED)     Status: Normal   Collection Time   01/09/12  4:05 PM      Component Value Range Comment   Opiates NONE DETECTED  NONE DETECTED     Cocaine NONE DETECTED  NONE DETECTED     Benzodiazepines NONE DETECTED  NONE DETECTED     Amphetamines NONE DETECTED  NONE DETECTED     Tetrahydrocannabinol NONE DETECTED  NONE DETECTED     Barbiturates NONE DETECTED  NONE DETECTED    CARDIAC PANEL(CRET KIN+CKTOT+MB+TROPI)     Status: Normal   Collection Time   01/09/12  5:25 PM      Component Value Range Comment  Total CK 89  7 - 177 (U/L)    CK, MB 1.1  0.3 - 4.0 (ng/mL)    Troponin I <0.30  <0.30 (ng/mL)    Relative Index RELATIVE INDEX IS INVALID  0.0 - 2.5      Diagnostics:  Ct Angio Chest W/cm &/or Wo Cm  01/09/2012  *RADIOLOGY REPORT*  Clinical Data: Sudden onset of dyspnea.  History of smoking, hysterectomy, cholecystectomy.  CT ANGIOGRAPHY CHEST  Technique:  Multidetector CT imaging of the chest using the standard protocol during bolus administration of intravenous contrast. Multiplanar reconstructed images including MIPs were obtained and reviewed to evaluate the vascular anatomy.  Contrast: OMNIPAQUE IOHEXOL 350 MG/ML IV SOLN, 1 OMNIPAQUE IOHEXOL 350 MG/ML IV SOLN  Comparison: 09/15/2011  Findings: The pulmonary arteries are well opacified.  There is no evidence for acute pulmonary embolus.  The heart is mildly enlarged.  There are dependent changes consistent  with mild pulmonary edema.  No significant mediastinal, hilar, or axillary adenopathy.  Note is made of small nodule within the minor fissure, stable in appearance and measuring approximately 5 mm on image 47 of series 7.  Images of the upper abdomen are unremarkable. Visualized osseous structures have a normal appearance.  IMPRESSION:  1.  Technically adequate exam showing no evidence for acute pulmonary embolus. 2.  Stable intrafissural nodule, likely a small lymph node on the right. 3.  Dependent changes consistent with mild pulmonary edema.  Original Report Authenticated By: Patterson Hammersmith, M.D.   Dg Chest Portable 1 View  01/09/2012  *RADIOLOGY REPORT*  Clinical Data: Shortness of breath.  PORTABLE CHEST - 1 VIEW  Comparison: 09/15/2011  Findings: Emphysema noted. Cardiac and mediastinal contours appear unremarkable.  Faint interstitial accentuation of the lung bases may be due to soft tissues of the chest wall but could also be seen in the setting of drug reaction or atypical pneumonia.  No compelling signs of edema.  IMPRESSION:  1.  Emphysema. 2.  Faint interstitial accentuation in the lung bases, at least partially due to distribution of soft tissues of the chest wall. The possibility of atypical pneumonia or drug reaction is also raised.  Original Report Authenticated By: Dellia Cloud, M.D.    Procedures:  EKG: Normal sinus rhythm Normal ECG When compared with ECG of 15-Feb-2011 04:39, Nonspecific T wave abnormality, improved in Inferior leads  Echocardiogram Left ventricle: The cavity size was normal. Wall thickness was increased in a pattern of mild LVH. Systolic function was normal. The estimated ejection fraction was in the range of 55% to 60%. Wall motion was normal; there were no regional wall motion abnormalities. Features are consistent with a pseudonormal left ventricular filling pattern, with concomitant abnormal relaxation and increased filling pressure (grade 2  diastolic dysfunction). - Mitral valve: Mildly thickened leaflets . Trivial regurgitation. - Tricuspid valve: Trivial regurgitation. - Pericardium, extracardiac: There was no pericardial effusion.   Full Code   Hospital Course: See H&P for complete admission details. The patient is a 56 year old black female who presented with multiple complaints. She was dizzy, short of breath. She had dysuria. No wheezing. No cough. She felt chills. She had several episodes of loose stool. Her symptoms occurred suddenly. In the emergency room, she was hypoxic with oxygen saturations in the 80s and a systolic blood pressure in the 80s. She had CT angiogram which showed pulmonary edema. Clinically however she had no evidence of CHF. She was given IV fluids and monitored in the step down unit. Her  oxygenation normalized. I question whether her original saturation was erroneous. She refused ABG. He had no evidence of UTI, pneumonia or other infection. She had no further loose stool. She had no further hypoxia or hypotension. She was able to ambulate. Her echocardiogram showed normal ejection fraction and no significant valvular abnormalities. She had diastolic dysfunction, but her pro BNP was normal. Total time on the day of discharge is greater than 30 minutes.  Discharge Exam:  Blood pressure 102/60, pulse 59, temperature 98.1 F (36.7 C), temperature source Oral, resp. rate 18, height 5\' 4"  (1.626 m), weight 58 kg (127 lb 13.9 oz), SpO2 99.00%.  See note from today.   SignedChristiane Ha 01/10/2012, 2:52 PM

## 2012-01-10 NOTE — Progress Notes (Signed)
PT TRANSFERRED TO ROOM 304  ON TELEMETRY. NSR. O2 AT 2L/MIN VIA [place]. NO DYSPNEA. DENIES ANY PAIN OR NAUSEA AT THIS TIME.IV NSL'D.Marland Kitchen ALERT AND ORIENTED.TRANSPORTED VIA W/C. REPORT CALLED TO MARYANN ON 300.

## 2012-02-04 ENCOUNTER — Emergency Department (HOSPITAL_COMMUNITY): Payer: Medicare Other

## 2012-02-04 ENCOUNTER — Other Ambulatory Visit: Payer: Self-pay

## 2012-02-04 ENCOUNTER — Encounter (HOSPITAL_COMMUNITY): Payer: Self-pay | Admitting: *Deleted

## 2012-02-04 ENCOUNTER — Inpatient Hospital Stay (HOSPITAL_COMMUNITY)
Admission: EM | Admit: 2012-02-04 | Discharge: 2012-02-05 | DRG: 195 | Disposition: A | Discharge status: Home / Self Care | Payer: Medicare Other | Attending: Internal Medicine | Admitting: Internal Medicine

## 2012-02-04 DIAGNOSIS — R0602 Shortness of breath: Secondary | ICD-10-CM | POA: Diagnosis not present

## 2012-02-04 DIAGNOSIS — J189 Pneumonia, unspecified organism: Secondary | ICD-10-CM | POA: Diagnosis not present

## 2012-02-04 DIAGNOSIS — R109 Unspecified abdominal pain: Secondary | ICD-10-CM | POA: Diagnosis not present

## 2012-02-04 DIAGNOSIS — E861 Hypovolemia: Secondary | ICD-10-CM | POA: Diagnosis present

## 2012-02-04 DIAGNOSIS — R059 Cough, unspecified: Secondary | ICD-10-CM | POA: Diagnosis not present

## 2012-02-04 DIAGNOSIS — R0600 Dyspnea, unspecified: Secondary | ICD-10-CM | POA: Diagnosis present

## 2012-02-04 DIAGNOSIS — J449 Chronic obstructive pulmonary disease, unspecified: Secondary | ICD-10-CM | POA: Diagnosis present

## 2012-02-04 DIAGNOSIS — I951 Orthostatic hypotension: Secondary | ICD-10-CM

## 2012-02-04 DIAGNOSIS — R031 Nonspecific low blood-pressure reading: Secondary | ICD-10-CM | POA: Diagnosis present

## 2012-02-04 DIAGNOSIS — Z79899 Other long term (current) drug therapy: Secondary | ICD-10-CM

## 2012-02-04 DIAGNOSIS — R1084 Generalized abdominal pain: Secondary | ICD-10-CM | POA: Diagnosis not present

## 2012-02-04 DIAGNOSIS — R0902 Hypoxemia: Secondary | ICD-10-CM | POA: Diagnosis present

## 2012-02-04 DIAGNOSIS — R5383 Other fatigue: Secondary | ICD-10-CM | POA: Diagnosis not present

## 2012-02-04 DIAGNOSIS — D696 Thrombocytopenia, unspecified: Secondary | ICD-10-CM | POA: Diagnosis not present

## 2012-02-04 DIAGNOSIS — I959 Hypotension, unspecified: Secondary | ICD-10-CM | POA: Diagnosis not present

## 2012-02-04 DIAGNOSIS — R918 Other nonspecific abnormal finding of lung field: Secondary | ICD-10-CM | POA: Diagnosis not present

## 2012-02-04 DIAGNOSIS — M549 Dorsalgia, unspecified: Secondary | ICD-10-CM

## 2012-02-04 DIAGNOSIS — R197 Diarrhea, unspecified: Secondary | ICD-10-CM

## 2012-02-04 DIAGNOSIS — J4489 Other specified chronic obstructive pulmonary disease: Secondary | ICD-10-CM | POA: Diagnosis present

## 2012-02-04 DIAGNOSIS — R300 Dysuria: Secondary | ICD-10-CM

## 2012-02-04 DIAGNOSIS — Z720 Tobacco use: Secondary | ICD-10-CM | POA: Diagnosis present

## 2012-02-04 DIAGNOSIS — R112 Nausea with vomiting, unspecified: Secondary | ICD-10-CM | POA: Diagnosis present

## 2012-02-04 DIAGNOSIS — E876 Hypokalemia: Secondary | ICD-10-CM | POA: Diagnosis not present

## 2012-02-04 DIAGNOSIS — R5381 Other malaise: Secondary | ICD-10-CM | POA: Diagnosis not present

## 2012-02-04 DIAGNOSIS — F17200 Nicotine dependence, unspecified, uncomplicated: Secondary | ICD-10-CM | POA: Diagnosis present

## 2012-02-04 LAB — URINALYSIS, ROUTINE W REFLEX MICROSCOPIC
Bilirubin Urine: NEGATIVE
Glucose, UA: NEGATIVE mg/dL
Hgb urine dipstick: NEGATIVE
Ketones, ur: NEGATIVE mg/dL
Nitrite: NEGATIVE
Specific Gravity, Urine: <1.005 — ABNORMAL LOW (ref 1.005–1.030)
pH: 6 (ref 5.0–8.0)

## 2012-02-04 LAB — DIFFERENTIAL
Basophils Absolute: 0 10*3/uL (ref 0.0–0.1)
Basophils Relative: 0 % (ref 0–1)
Eosinophils Absolute: 0 10*3/uL (ref 0.0–0.7)
Monocytes Absolute: 0.4 10*3/uL (ref 0.1–1.0)
Monocytes Relative: 3 % (ref 3–12)
Neutro Abs: 9.3 10*3/uL — ABNORMAL HIGH (ref 1.7–7.7)
Neutrophils Relative %: 82 % — ABNORMAL HIGH (ref 43–77)

## 2012-02-04 LAB — HEPATIC FUNCTION PANEL
ALT: 10 U/L (ref 0–35)
AST: 15 U/L (ref 0–37)
Albumin: 3.7 g/dL (ref 3.5–5.2)
Alkaline Phosphatase: 87 U/L (ref 39–117)
Total Bilirubin: 0.4 mg/dL (ref 0.3–1.2)
Total Protein: 6.5 g/dL (ref 6.0–8.3)

## 2012-02-04 LAB — LIPASE, BLOOD: Lipase: 25 U/L (ref 11–59)

## 2012-02-04 LAB — BLOOD GAS, ARTERIAL
Bicarbonate: 23.2 mEq/L (ref 20.0–24.0)
O2 Saturation: 86.4 %
Patient temperature: 37
TCO2: 20.7 mmol/L (ref 0–100)
pH, Arterial: 7.38 (ref 7.350–7.400)

## 2012-02-04 LAB — MRSA PCR SCREENING: MRSA by PCR: NEGATIVE

## 2012-02-04 LAB — URINE MICROSCOPIC-ADD ON

## 2012-02-04 LAB — TSH: TSH: 0.646 u[IU]/mL (ref 0.350–4.500)

## 2012-02-04 LAB — PRO B NATRIURETIC PEPTIDE: Pro B Natriuretic peptide (BNP): 52.8 pg/mL (ref 0–125)

## 2012-02-04 LAB — CBC
MCH: 28 pg (ref 26.0–34.0)
MCHC: 32.2 g/dL (ref 30.0–36.0)
RDW: 13.5 % (ref 11.5–15.5)

## 2012-02-04 LAB — BASIC METABOLIC PANEL
BUN: 10 mg/dL (ref 6–23)
Chloride: 105 mEq/L (ref 96–112)
Creatinine, Ser: 1.05 mg/dL (ref 0.50–1.10)
GFR calc Af Amer: 69 mL/min — ABNORMAL LOW (ref >90)
GFR calc non Af Amer: 60 mL/min — ABNORMAL LOW (ref >90)
Potassium: 3.2 mEq/L — ABNORMAL LOW (ref 3.5–5.1)

## 2012-02-04 MED ORDER — METHYLPREDNISOLONE SODIUM SUCC 125 MG IJ SOLR: 60.0000 mg | Freq: Four times a day (QID) | INTRAMUSCULAR | Status: DC

## 2012-02-04 MED ORDER — ONDANSETRON HCL 4 MG PO TABS: 4.0000 mg | ORAL_TABLET | Freq: Four times a day (QID) | ORAL | Status: DC | PRN

## 2012-02-04 MED ORDER — IPRATROPIUM BROMIDE 0.02 % IN SOLN
0.5000 mg | Freq: Once | RESPIRATORY_TRACT | Status: AC
  Administered 2012-02-04: 0.5 mg via RESPIRATORY_TRACT
  Filled 2012-02-04: qty 2.5

## 2012-02-04 MED ORDER — IOHEXOL 300 MG/ML  SOLN
40.0000 mL | Freq: Once | INTRAMUSCULAR | Status: AC | PRN
  Administered 2012-02-04: 40 mL via ORAL

## 2012-02-04 MED ORDER — HYDROMORPHONE HCL PF 1 MG/ML IJ SOLN
0.5000 mg | INTRAMUSCULAR | Status: DC | PRN
  Administered 2012-02-04 – 2012-02-05 (×4): 0.5 mg via INTRAVENOUS
  Filled 2012-02-04 (×4): qty 1

## 2012-02-04 MED ORDER — IPRATROPIUM BROMIDE 0.02 % IN SOLN
0.5000 mg | Freq: Four times a day (QID) | RESPIRATORY_TRACT | Status: DC
  Administered 2012-02-04 – 2012-02-05 (×3): 0.5 mg via RESPIRATORY_TRACT
  Filled 2012-02-04 (×4): qty 2.5

## 2012-02-04 MED ORDER — PIPERACILLIN-TAZOBACTAM 3.375 G IVPB
3.3750 g | Freq: Once | INTRAVENOUS | Status: DC
Start: 2012-02-04 — End: 2012-02-04
  Administered 2012-02-04: 3.375 g via INTRAVENOUS
  Filled 2012-02-04: qty 50

## 2012-02-04 MED ORDER — ALBUTEROL SULFATE (5 MG/ML) 0.5% IN NEBU
2.5000 mg | INHALATION_SOLUTION | Freq: Four times a day (QID) | RESPIRATORY_TRACT | Status: DC
  Administered 2012-02-04 – 2012-02-05 (×3): 2.5 mg via RESPIRATORY_TRACT
  Filled 2012-02-04 (×4): qty 0.5

## 2012-02-04 MED ORDER — IOHEXOL 300 MG/ML  SOLN
100.0000 mL | Freq: Once | INTRAMUSCULAR | Status: AC | PRN
  Administered 2012-02-04: 100 mL via INTRAVENOUS

## 2012-02-04 MED ORDER — ACETAMINOPHEN 650 MG RE SUPP: 650.0000 mg | Freq: Four times a day (QID) | RECTAL | Status: DC | PRN

## 2012-02-04 MED ORDER — ALBUTEROL SULFATE (5 MG/ML) 0.5% IN NEBU: 2.5000 mg | INHALATION_SOLUTION | RESPIRATORY_TRACT | Status: DC | PRN

## 2012-02-04 MED ORDER — DEXTROSE 5 % IV SOLN
1.0000 g | Freq: Two times a day (BID) | INTRAVENOUS | Status: DC
  Administered 2012-02-04 (×2): 1 g via INTRAVENOUS
  Filled 2012-02-04 (×3): qty 1

## 2012-02-04 MED ORDER — OXYCODONE-ACETAMINOPHEN 5-325 MG PO TABS
1.0000 | ORAL_TABLET | Freq: Once | ORAL | Status: AC
  Administered 2012-02-04: 1 via ORAL
  Filled 2012-02-04: qty 1

## 2012-02-04 MED ORDER — NICOTINE 14 MG/24HR TD PT24
14.0000 mg | MEDICATED_PATCH | Freq: Every day | TRANSDERMAL | Status: DC
  Administered 2012-02-04: 14 mg via TRANSDERMAL
  Filled 2012-02-04 (×3): qty 1

## 2012-02-04 MED ORDER — LEVOFLOXACIN IN D5W 500 MG/100ML IV SOLN
500.0000 mg | INTRAVENOUS | Status: DC
  Administered 2012-02-04 – 2012-02-05 (×2): 500 mg via INTRAVENOUS
  Filled 2012-02-04 (×2): qty 100

## 2012-02-04 MED ORDER — ALBUTEROL SULFATE (5 MG/ML) 0.5% IN NEBU
5.0000 mg | INHALATION_SOLUTION | Freq: Once | RESPIRATORY_TRACT | Status: AC
  Administered 2012-02-04: 5 mg via RESPIRATORY_TRACT
  Filled 2012-02-04: qty 1

## 2012-02-04 MED ORDER — SODIUM CHLORIDE 0.9 % IV BOLUS (SEPSIS)
1000.0000 mL | Freq: Once | INTRAVENOUS | Status: AC
  Administered 2012-02-04: 1000 mL via INTRAVENOUS

## 2012-02-04 MED ORDER — ONDANSETRON HCL 4 MG/2ML IJ SOLN: 4.0000 mg | Freq: Four times a day (QID) | INTRAMUSCULAR | Status: DC | PRN

## 2012-02-04 MED ORDER — VANCOMYCIN HCL 500 MG IV SOLR
500.0000 mg | Freq: Two times a day (BID) | INTRAVENOUS | Status: DC
  Administered 2012-02-04 (×2): 500 mg via INTRAVENOUS
  Filled 2012-02-04 (×3): qty 500

## 2012-02-04 MED ORDER — POTASSIUM CHLORIDE IN NACL 20-0.9 MEQ/L-% IV SOLN
INTRAVENOUS | Status: DC
  Administered 2012-02-05: via INTRAVENOUS

## 2012-02-04 MED ORDER — ENOXAPARIN SODIUM 40 MG/0.4ML ~~LOC~~ SOLN
40.0000 mg | SUBCUTANEOUS | Status: DC
  Administered 2012-02-04 – 2012-02-05 (×2): 40 mg via SUBCUTANEOUS
  Filled 2012-02-04 (×3): qty 0.4

## 2012-02-04 MED ORDER — PANTOPRAZOLE SODIUM 40 MG IV SOLR
40.0000 mg | INTRAVENOUS | Status: DC
  Administered 2012-02-04 – 2012-02-05 (×2): 40 mg via INTRAVENOUS
  Filled 2012-02-04 (×2): qty 40

## 2012-02-04 MED ORDER — SODIUM CHLORIDE 0.9 % IJ SOLN
3.0000 mL | Freq: Two times a day (BID) | INTRAMUSCULAR | Status: DC
  Administered 2012-02-04: 3 mL via INTRAVENOUS
  Filled 2012-02-04: qty 3

## 2012-02-04 MED ORDER — SODIUM CHLORIDE 0.9 % IV BOLUS (SEPSIS)
500.0000 mL | Freq: Once | INTRAVENOUS | Status: AC
  Administered 2012-02-04: 500 mL via INTRAVENOUS

## 2012-02-04 MED ORDER — METHYLPREDNISOLONE SODIUM SUCC 125 MG IJ SOLR
125.0000 mg | Freq: Once | INTRAMUSCULAR | Status: AC
  Administered 2012-02-04: 125 mg via INTRAVENOUS
  Filled 2012-02-04: qty 2

## 2012-02-04 MED ORDER — ACETAMINOPHEN 325 MG PO TABS: 650.0000 mg | ORAL_TABLET | Freq: Four times a day (QID) | ORAL | Status: DC | PRN

## 2012-02-04 MED ORDER — BIOTENE DRY MOUTH MT LIQD
15.0000 mL | Freq: Two times a day (BID) | OROMUCOSAL | Status: DC
  Administered 2012-02-04 – 2012-02-05 (×3): 15 mL via OROMUCOSAL

## 2012-02-04 NOTE — Progress Notes (Signed)
PT REQUESTING TO SMOKE A E-CIGAR AND WAS TOLD THAT SHE COULD. WRITER CALLED AND ASKED AC WINNIE, RN AND IT IS OK FOR HER TO USE IT.  PT MADE AWARE.

## 2012-02-04 NOTE — H&P (Signed)
Virginia Washington is an 56 y.o. female.    PCP: Vivia Ewing, MD, MD   Chief Complaint: Sudden onset nausea, vomiting, and abdominal pain  HPI: This is a 56 year old, African American female, who does not have any significant medical problems except for COPD. She was admitted back in January for similar complaints. 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 But before she could start working she had sudden onset of dizziness, vomiting, stomach pain. Then, she started having shortness of breath with cough with clear expectoration. She felt a little short of breath. Denies any fever. Denies any diarrhea this time. Denies any chest pain. Denies any headaches. No focal weakness. Still has abdominal pain in the lower abdomen, which is about 7/10 in intensity. She's unable to describe this pain. There is no radiation of the pain. Denies any dysuria, hematuria. Denies any vaginal discharge. She said this is the third time such an episode has happened, and she does not know what is causing them. She also felt a little dizzy, earlier today, but that has improved.   Home Medications: Prior to Admission medications   Medication Sig Start Date End Date Taking? Authorizing Provider  Estrogens Conjugated (PREMARIN PO) Take by mouth.   Yes Historical Provider, MD  albuterol (PROVENTIL HFA;VENTOLIN HFA) 108 (90 BASE) MCG/ACT inhaler Inhale 1-2 puffs into the lungs every 4 (four) hours as needed. Shortness of breath 09/15/11 09/14/12  Tammy L. Triplett, PA    Allergies: No Known Allergies  Past Medical History: Past Medical History  Diagnosis Date  . COPD (chronic obstructive pulmonary disease)     Past Surgical History  Procedure Date  . Abdominal hysterectomy   . Cholecystectomy     Social History:  reports that she has been smoking.  She does not have any smokeless tobacco history on file. She reports that  she does not drink alcohol or use illicit drugs.  Family History: None per patient  Review of Systems - History obtained from the patient General ROS: positive for  - malaise Psychological ROS: negative Ophthalmic ROS: negative ENT ROS: negative Allergy and Immunology ROS: negative Hematological and Lymphatic ROS: negative Endocrine ROS: negative Respiratory ROS: as in hpi Cardiovascular ROS: negative Gastrointestinal ROS: as in hpi Genito-Urinary ROS: negative Musculoskeletal ROS: negative Neurological ROS: negative Dermatological ROS: negative  Physical Examination Blood pressure 81/53, pulse 76, temperature 98.5 F (36.9 C), temperature source Oral, resp. rate 26, SpO2 93.00%.  General appearance: alert, cooperative and no distress Head: Normocephalic, without obvious abnormality, atraumatic Eyes: conjunctivae/corneas clear. PERRL, EOM's intact.  Throat: lips, mucosa, and tongue normal; teeth and gums normal Neck: no adenopathy, no carotid bruit, no JVD, supple, symmetrical, trachea midline and thyroid not enlarged, symmetric, no tenderness/mass/nodules Resp: Decreased air in tree at the bases bilaterally, but more so on the left side. Few crackles at the bases. No wheezing is present. Cardio: regular rate and rhythm, S1, S2 normal, no murmur, click, rub or gallop GI: Abdomen is soft. There is tenderness in the lower quadrants. No masses, organomegaly appreciated. Bowel sounds are present. Extremities: extremities normal, atraumatic, no cyanosis or edema Pulses: 2+ and symmetric Skin: Skin color, texture, turgor normal. No rashes or lesions Lymph nodes: Cervical, supraclavicular, and axillary nodes normal. Neurologic: Grossly normal  Laboratory Data: Results for orders placed during the hospital encounter of 02/04/12 (from the past 48 hour(s))  GLUCOSE, CAPILLARY  Status: Abnormal   Collection Time   02/04/12  3:14 AM      Component Value Range Comment    Glucose-Capillary 151 (*) 70 - 99 (mg/dL)   CBC     Status: Abnormal   Collection Time   02/04/12  3:56 AM      Component Value Range Comment   WBC 11.4 (*) 4.0 - 10.5 (K/uL)    RBC 4.79  3.87 - 5.11 (MIL/uL)    Hemoglobin 13.4  12.0 - 15.0 (g/dL)    HCT 40.9  81.1 - 91.4 (%)    MCV 86.8  78.0 - 100.0 (fL)    MCH 28.0  26.0 - 34.0 (pg)    MCHC 32.2  30.0 - 36.0 (g/dL)    RDW 78.2  95.6 - 21.3 (%)    Platelets 190  150 - 400 (K/uL)   DIFFERENTIAL     Status: Abnormal   Collection Time   02/04/12  3:56 AM      Component Value Range Comment   Neutrophils Relative 82 (*) 43 - 77 (%)    Neutro Abs 9.3 (*) 1.7 - 7.7 (K/uL)    Lymphocytes Relative 15  12 - 46 (%)    Lymphs Abs 1.7  0.7 - 4.0 (K/uL)    Monocytes Relative 3  3 - 12 (%)    Monocytes Absolute 0.4  0.1 - 1.0 (K/uL)    Eosinophils Relative 0  0 - 5 (%)    Eosinophils Absolute 0.0  0.0 - 0.7 (K/uL)    Basophils Relative 0  0 - 1 (%)    Basophils Absolute 0.0  0.0 - 0.1 (K/uL)   BASIC METABOLIC PANEL     Status: Abnormal   Collection Time   02/04/12  3:56 AM      Component Value Range Comment   Sodium 138  135 - 145 (mEq/L)    Potassium 3.2 (*) 3.5 - 5.1 (mEq/L)    Chloride 105  96 - 112 (mEq/L)    CO2 25  19 - 32 (mEq/L)    Glucose, Bld 145 (*) 70 - 99 (mg/dL)    BUN 10  6 - 23 (mg/dL)    Creatinine, Ser 0.86  0.50 - 1.10 (mg/dL)    Calcium 9.8  8.4 - 10.5 (mg/dL)    GFR calc non Af Amer 60 (*) >90 (mL/min)    GFR calc Af Amer 69 (*) >90 (mL/min)   PRO B NATRIURETIC PEPTIDE     Status: Normal   Collection Time   02/04/12  3:56 AM      Component Value Range Comment   Pro B Natriuretic peptide (BNP) 52.8  0 - 125 (pg/mL)   BLOOD GAS, ARTERIAL     Status: Abnormal   Collection Time   02/04/12  3:56 AM      Component Value Range Comment   FIO2 1.00      Delivery systems NON-REBREATHER OXYGEN MASK      pH, Arterial 7.380  7.350 - 7.400     pCO2 arterial 40.2  35.0 - 45.0 (mmHg)    pO2, Arterial 49.3 (*) 80.0 - 100.0  (mmHg)    Bicarbonate 23.2  20.0 - 24.0 (mEq/L)    TCO2 20.7  0 - 100 (mmol/L)    Acid-base deficit 1.2  0.0 - 2.0 (mmol/L)    O2 Saturation 86.4      Patient temperature 37.0      Collection site RIGHT RADIAL  Drawn by COLLECTED BY RT      Sample type ARTERIAL      Allens test (pass/fail) PASS  PASS    POCT I-STAT TROPONIN I     Status: Normal   Collection Time   02/04/12  4:37 AM      Component Value Range Comment   Troponin i, poc 0.00  0.00 - 0.08 (ng/mL)    Comment 3              Radiology Reports: Dg Chest Port 1 View  02/04/2012  *RADIOLOGY REPORT*  Clinical Data: Shortness of breath and weakness.  PORTABLE CHEST - 1 VIEW  Comparison: Chest radiograph and CTA of the chest performed 01/09/2012  Findings: Mild new left basilar airspace opacity raises concern for mild pneumonia, given clinical concern.  Additional underlying dependent interstitial changes are stable from prior studies.  No definite pleural effusion or pneumothorax is seen.  Mild vascular congestion is noted.  The cardiomediastinal silhouette is borderline normal in size.  No acute osseous abnormalities are identified.  IMPRESSION: Mild new left basilar airspace opacity raises concern for mild pneumonia, given clinical concern.  Underlying interstitial changes are stable from recent prior studies; minimal edema could have this appearance.  Mild vascular congestion noted.  Original Report Authenticated By: Tonia Ghent, M.D.    Electrocardiogram: EKG shows sinus rhythm at 74 beats per minute with normal axis. Intervals appear to be in the normal range. No Q waves. No concerning ST changes.  Assessment/Plan  Principal Problem:  *Abdominal pain Active Problems:  Nausea & vomiting  Tobacco abuse  Hypotension  Hypoxia  Dyspnea  Hypokalemia  Pneumonia   #1 abdominal pain with nausea and vomiting: Etiology for this remains unclear. Since this is the second episode with similar presentation we will proceed with  CT scan of the abdomen and pelvis. A UA will be checked. Lipase, and liver function test, will also be checked.  #2 hypotension: This has improved with IV fluids. Will give her some more bolus. IV fluids will be continued. She'll be monitored in the step down unit. Etiology for this remains unclear. Her cortisol level will be checked. Off note, she had an echocardiogram during previous hospitalization, which revealed a normal systolic function.  #3 hypoxia, and dyspnea: She does have a history of COPD. There is some mention of pneumonia on the chest x-ray. That can explain her symptoms. It is quite possible that when the patient had vomiting she may have aspirated. Once again, she had a similar presentation last month. We will cover her with broad-spectrum antibiotics due to her recent hospitalization. We will provide oxygen. Of note, she did have a CT angio of chest during her previous hospitalization, which was negative for PE. Her BNP is within normal limits.  #4 tobacco abuse: Counseling will be provided. Nicotine patch will be provided.  #5 pneumonia, which should, be considered either Healthcare associated or aspiration: Please see above.  Potassium will be repleted intravenously. Magnesium level will be checked  DVT, prophylaxis will be provided. She's a full code.  Further management decisions will depend on results of further testing and patient's response to treatment.  [facility]  [facility] Pager [phone]  02/04/2012, 5:33 AM

## 2012-02-04 NOTE — Progress Notes (Signed)
ANTIBIOTIC CONSULT NOTE - INITIAL  Pharmacy Consult for Cefepime & Vancomycin Indication: pneumonia (HCAP)  No Known Allergies  Patient Measurements: Height: 5\' 2"  (157.5 cm) Weight: 123 lb 7.3 oz (56 kg) IBW/kg (Calculated) : 50.1    Vital Signs: Temp: 97.9 F (36.6 C) (02/17 0700) Temp src: Oral (02/17 0700) BP: 115/70 mmHg (02/17 0700) Pulse Rate: 69  (02/17 0700) Intake/Output from previous day:   Intake/Output from this shift:    Labs:  Basename 02/04/12 0356  WBC 11.4*  HGB 13.4  PLT 190  LABCREA --  CREATININE 1.05   Estimated Creatinine Clearance: 49 ml/min (by C-G formula based on Cr of 1.05). No results found for this basename: VANCOTROUGH:2,VANCOPEAK:2,VANCORANDOM:2,GENTTROUGH:2,GENTPEAK:2,GENTRANDOM:2,TOBRATROUGH:2,TOBRAPEAK:2,TOBRARND:2,AMIKACINPEAK:2,AMIKACINTROU:2,AMIKACIN:2, in the last 72 hours   Microbiology: Recent Results (from the past 720 hour(s))  MRSA PCR SCREENING     Status: Normal   Collection Time   01/09/12  3:07 PM      Component Value Range Status Comment   MRSA by PCR NEGATIVE  NEGATIVE  Final     Medical History: Past Medical History  Diagnosis Date  . COPD (chronic obstructive pulmonary disease)     Medications:  Scheduled:    . albuterol  2.5 mg Nebulization Q6H  . albuterol  5 mg Nebulization Once  . enoxaparin  40 mg Subcutaneous Q24H  . ipratropium  0.5 mg Nebulization Once  . ipratropium  0.5 mg Nebulization Q6H  . levofloxacin (LEVAQUIN) IV  500 mg Intravenous Q24H  . methylPREDNISolone (SOLU-MEDROL) injection  125 mg Intravenous Once  . nicotine  14 mg Transdermal Daily  . oxyCODONE-acetaminophen  1 tablet Oral Once  . pantoprazole (PROTONIX) IV  40 mg Intravenous Q24H  . sodium chloride  1,000 mL Intravenous Once  . sodium chloride  500 mL Intravenous Once  . sodium chloride  500 mL Intravenous Once  . sodium chloride  3 mL Intravenous Q12H  . DISCONTD: methylPREDNISolone (SOLU-MEDROL) injection  60 mg  Intravenous Q6H  . DISCONTD: piperacillin-tazobactam (ZOSYN)  IV  3.375 g Intravenous Once   Assessment: Okay for Protocol Recent Hospital Admission Potential Aspiration Also on Levaquin 500mg  IV daily.  Goal of Therapy:  Vancomycin trough level 15-20 mcg/ml  Plan: Vancomycin 500mg  IV every 12 hours. Cefepime 1gm IV every 12 hours. Measure antibiotic drug levels at steady state Follow up culture results  Mady Gemma 02/04/2012,7:53 AM

## 2012-02-04 NOTE — ED Provider Notes (Signed)
History     CSN: [id number]  Arrival date & time 02/04/12  0305   First MD Initiated Contact with Patient 02/04/12 0303      Chief Complaint  Patient presents with  . Abdominal Pain     Patient is a 56 y.o. female presenting with shortness of breath. The history is provided by the patient.  Shortness of Breath  The current episode started today. The onset was sudden. The problem occurs occasionally. The problem has been gradually worsening. The problem is severe. The symptoms are relieved by nothing. The symptoms are aggravated by nothing. Associated symptoms include chest pressure, cough and shortness of breath.  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 COPD (chronic obstructive pulmonary disease)     Past Surgical History  Procedure Date  . Abdominal hysterectomy   . Cholecystectomy     No family history on file.  History  Substance Use Topics  . Smoking status: Current Everyday Smoker -- 0.5 packs/day  . Smokeless tobacco: Not on file  . Alcohol Use: No    OB History    Grav Para Term Preterm Abortions TAB SAB Ect Mult Living                  Review of Systems  Respiratory: Positive for cough and shortness of breath.   All other systems reviewed and are negative.    Allergies  Review of patient's allergies indicates no known allergies.  Home Medications   Current Outpatient Rx  Name Route Sig Dispense Refill  . ALBUTEROL SULFATE HFA 108 (90 BASE) MCG/ACT IN AERS Inhalation Inhale 1-2 puffs into the lungs every 4 (four) hours as needed. Shortness of breath     BP 105/60  Pulse 71  Temp(Src) 98.5 F (36.9 C) (Oral)  Resp 26  SpO2 89%  Physical Exam CONSTITUTIONAL: Well developed/well nourished HEAD AND FACE:  Normocephalic/atraumatic EYES: EOMI/PERRL ENMT: Mucous membranes moist, poor dentition NECK: supple no meningeal signs SPINE:entire spine nontender CV: S1/S2 noted, no murmurs/rubs/gallops noted LUNGS: coarse breath sounds noted bilaterally, tachypnea noted but she is able to speak to me ABDOMEN: soft, nontender, no rebound or guarding GU:no cva tenderness NEURO: Pt is awake/alert, moves all extremitiesx4 EXTREMITIES: pulses normal, full ROM SKIN: warm, color normal PSYCH: no abnormalities of mood noted  ED Course  Procedures   CRITICAL CARE Performed by: Joya Gaskins   Total critical care time: 45  Critical care time was exclusive of separately billable procedures and treating other patients.  Critical care was necessary to treat or prevent imminent or life-threatening deterioration.  Critical care was time spent personally by me on the following activities: development of treatment plan with patient and/or surrogate as well as nursing, discussions with consultants, evaluation of patient's response to treatment, examination of patient, obtaining history from patient or surrogate, ordering and performing treatments and interventions, ordering and review of laboratory studies, ordering and review of radiographic studies, pulse oximetry and re-evaluation of patient's condition.   Labs Reviewed  GLUCOSE, CAPILLARY - Abnormal; Notable for the following:    Glucose-Capillary 151 (*)    All other components within normal limits  CBC  DIFFERENTIAL  BASIC METABOLIC PANEL  PRO B NATRIURETIC PEPTIDE  BLOOD GAS, ARTERIAL  3:25 AM Pt initially reported abdominal pain, but this is mostly SOB.  She is hypoxic (80s) initially, will provide supplemental O2 and start bronchodilators.  Will follow closely.  Recent hospital admission, noted to have normal EF on echo, negative CT chest for PE.   Pt reports she does not use home oxygen  3:50 AM Pt with some improvement in  oxygenation  4:04 AM ABG noted Pt seems improved.  She is able to speak to me clearly.  Her lungs sounds have improved.   Requesting pain meds for abdominal pain - abd soft and no focal tenderness noted   4:38 AM Pt improved, no hypoxia and her BP is improved D/w dr Rito Ehrlich, will admit Pt with two admits since November, this qualifies as healthcare associated pneumonia  MDM  Nursing notes reviewed and considered in documentation xrays reviewed and considered Previous records reviewed and considered All labs/vitals reviewed and considered        Date: 02/04/2012  Rate: 74  Rhythm: normal sinus rhythm  QRS Axis: normal  Intervals: normal  ST/T Wave abnormalities: normal  Conduction Disutrbances:none    Joya Gaskins, MD 02/04/12 0505

## 2012-02-04 NOTE — Progress Notes (Signed)
Subjective: This lady was admitted with hypotension, left greater than right lower lobe infiltrates an abdominal pain which apparently radiates to her. This was associated with dizziness. She has been given intravenous fluids and her has come up nicely. She feels better overall. She had a similar admission presentation approximately one month ago and resolved with symptoms very quickly. Her echocardiogram at that time was within normal limits.           Physical Exam: Blood pressure 115/70, pulse 69, temperature 97.9 F (36.6 C), temperature source Oral, resp. rate 17, height 5\' 2"  (1.575 m), weight 56 kg (123 lb 7.3 oz), SpO2 98.00%. She does look systemically well at this time. She is not clinically shock. Heart sounds are present and normal without murmurs. Pressure not raised. Lung fields show decreased air entry in both bases with a few crackles in the left base. There are also a few crackles in the left mid zone. Her abdomen is soft and nontender. There is no evidence of abdominal aortic aneurysm clinically.   Investigations:     Basic Metabolic Panel:  Basename 02/04/12 0356  NA 138  K 3.2*  CL 105  CO2 25  GLUCOSE 145*  BUN 10  CREATININE 1.05  CALCIUM 9.8  MG --  PHOS --   Liver Function Tests:  [facility] 02/04/12 0356  AST 15  ALT 10  ALKPHOS 87  BILITOT 0.4  PROT 6.5  ALBUMIN 3.7     CBC:  Basename 02/04/12 0356  WBC 11.4*  NEUTROABS 9.3*  HGB 13.4  HCT 41.6  MCV 86.8  PLT 190    Dg Chest Port 1 View  02/04/2012  *RADIOLOGY REPORT*  Clinical Data: Shortness of breath and weakness.  PORTABLE CHEST - 1 VIEW  Comparison: Chest radiograph and CTA of the chest performed 01/09/2012  Findings: Mild new left basilar airspace opacity raises concern for mild pneumonia, given clinical concern.  Additional underlying dependent interstitial changes are stable from prior studies.  No definite pleural effusion or pneumothorax is seen.  Mild vascular congestion  is noted.  The cardiomediastinal silhouette is borderline normal in size.  No acute osseous abnormalities are identified.  IMPRESSION: Mild new left basilar airspace opacity raises concern for mild pneumonia, given clinical concern.  Underlying interstitial changes are stable from recent prior studies; minimal edema could have this appearance.  Mild vascular congestion noted.  Original Report Authenticated By: Tonia Ghent, M.D.      Medications: I have reviewed the patient's current medications.  Impression: 1. Hypotension, unclear etiology. 2. Abdominal pain, unclear 3. Pneumonia left lower lobe. 4. Ongoing tobacco abuse 5. Hypokalemia.     Plan: 1. Await CT scan of the abdomen. 2. Continue current treatment for the time being. 3. Transfer to telemetry floor.     LOS: 0 days   Wilson Singer Pager [phone]  02/04/2012, 7:30 AM

## 2012-02-04 NOTE — ED Notes (Signed)
Pt reports she was on her way to work tonight and became very sob and dizzy, pt cool to touch, 02 sats, 80s EDP in upon arrival

## 2012-02-05 DIAGNOSIS — D696 Thrombocytopenia, unspecified: Secondary | ICD-10-CM | POA: Diagnosis not present

## 2012-02-05 DIAGNOSIS — R1084 Generalized abdominal pain: Secondary | ICD-10-CM | POA: Diagnosis not present

## 2012-02-05 DIAGNOSIS — I959 Hypotension, unspecified: Secondary | ICD-10-CM | POA: Diagnosis not present

## 2012-02-05 DIAGNOSIS — R0902 Hypoxemia: Secondary | ICD-10-CM | POA: Diagnosis not present

## 2012-02-05 LAB — COMPREHENSIVE METABOLIC PANEL
ALT: 22 U/L (ref 0–35)
AST: 16 U/L (ref 0–37)
Calcium: 9.1 mg/dL (ref 8.4–10.5)
GFR calc Af Amer: >90 mL/min (ref >90)
Sodium: 144 mEq/L (ref 135–145)
Total Protein: 5.8 g/dL — ABNORMAL LOW (ref 6.0–8.3)

## 2012-02-05 LAB — PROTIME-INR: INR: 1.4 (ref 0.00–1.49)

## 2012-02-05 LAB — CBC
HCT: 37.1 % (ref 36.0–46.0)
Hemoglobin: 11.9 g/dL — ABNORMAL LOW (ref 12.0–15.0)
MCHC: 32.1 g/dL (ref 30.0–36.0)
RBC: 4.25 MIL/uL (ref 3.87–5.11)

## 2012-02-05 LAB — MAGNESIUM: Magnesium: 2 mg/dL (ref 1.5–2.5)

## 2012-02-05 MED ORDER — NICOTINE 14 MG/24HR TD PT24: 1.0000 | MEDICATED_PATCH | Freq: Every day | TRANSDERMAL | Status: AC

## 2012-02-05 MED ORDER — LEVOFLOXACIN 500 MG PO TABS: 500.0000 mg | ORAL_TABLET | Freq: Every day | ORAL | Status: AC

## 2012-02-05 MED ORDER — OXYCODONE HCL 5 MG PO TABS: 5.0000 mg | ORAL_TABLET | ORAL | Status: AC | PRN

## 2012-02-05 NOTE — Progress Notes (Signed)
ANTIBIOTIC CONSULT NOTE - INITIAL  Pharmacy Consult for Cefepime & Vancomycin Indication: pneumonia (HCAP)  No Known Allergies  Patient Measurements: Height: 5\' 2"  (157.5 cm) Weight: 123 lb 7.3 oz (56 kg) IBW/kg (Calculated) : 50.1    Vital Signs: Temp: 98 F (36.7 C) (02/18 0607) Temp src: Oral (02/18 0607) BP: 159/75 mmHg (02/18 0607) Pulse Rate: 55  (02/18 0607) Intake/Output from previous day: 02/17 0701 - 02/18 0700 In: 2110.5 [P.O.:720; I.V.:1140.5; IV Piggyback:250] Out: 1100 [Urine:1100] Intake/Output from this shift: Total I/O In: 100 [P.O.:100] Out: -   Labs:  Basename 02/05/12 0506 02/04/12 0356  WBC 15.5* 11.4*  HGB 11.9* 13.4  PLT 199 190  LABCREA -- --  CREATININE 0.82 1.05   Estimated Creatinine Clearance: 62.8 ml/min (by C-G formula based on Cr of 0.82). No results found for this basename: VANCOTROUGH:2,VANCOPEAK:2,VANCORANDOM:2,GENTTROUGH:2,GENTPEAK:2,GENTRANDOM:2,TOBRATROUGH:2,TOBRAPEAK:2,TOBRARND:2,AMIKACINPEAK:2,AMIKACINTROU:2,AMIKACIN:2, in the last 72 hours   Microbiology: Recent Results (from the past 720 hour(s))  MRSA PCR SCREENING     Status: Normal   Collection Time   01/09/12  3:07 PM      Component Value Range Status Comment   MRSA by PCR NEGATIVE  NEGATIVE  Final   MRSA PCR SCREENING     Status: Normal   Collection Time   02/04/12  7:15 AM      Component Value Range Status Comment   MRSA by PCR NEGATIVE  NEGATIVE  Final     Medical History: Past Medical History  Diagnosis Date  . COPD (chronic obstructive pulmonary disease)     Medications:  Scheduled:     . albuterol  2.5 mg Nebulization Q6H  . antiseptic oral rinse  15 mL Mouth Rinse BID  . ceFEPime (MAXIPIME) IV  1 g Intravenous Q12H  . enoxaparin  40 mg Subcutaneous Q24H  . ipratropium  0.5 mg Nebulization Q6H  . levofloxacin (LEVAQUIN) IV  500 mg Intravenous Q24H  . nicotine  14 mg Transdermal Daily  . pantoprazole (PROTONIX) IV  40 mg Intravenous Q24H  .  vancomycin  500 mg Intravenous Q12H  . DISCONTD: sodium chloride  3 mL Intravenous Q12H   Assessment: Okay for Protocol Recent Hospital Admission Potential Aspiration Also on Levaquin 500mg  IV daily.  Goal of Therapy:  Vancomycin trough level 15-20 mcg/ml  Plan: Vancomycin 500mg  IV every 12 hours. Cefepime 1gm IV every 12 hours. Check trough level tomorrow  Valrie Hart A 02/05/2012,9:13 AM

## 2012-02-05 NOTE — Progress Notes (Signed)
UR Chart Review Completed  

## 2012-02-05 NOTE — Progress Notes (Signed)
D/c instructions reviewed with patient. Verbalized understanding. Pt dc'd to home with family.  Schonewitz, Candelaria Stagers 02/05/2012

## 2012-02-05 NOTE — Discharge Summary (Signed)
Physician Discharge Summary  Patient ID: Virginia Washington MRN: [id number] DOB/AGE: 1956-10-09 56 y.o. Primary Care Physician:HALM, Viviann Spare, MD, MD Admit date: 02/04/2012 Discharge date: 02/05/2012    Discharge Diagnoses:  1. Pneumonia, left lower lobe. 2. Abdominal pain, unclear etiology, resolved. 3. Hypotension, resolved. Likely related to hypovolemia. 4. Ongoing tobacco abuse.   Medication List  As of 02/05/2012  9:54 AM   STOP taking these medications         albuterol 108 (90 BASE) MCG/ACT inhaler      PREMARIN PO         TAKE these medications         levofloxacin 500 MG tablet   Commonly known as: LEVAQUIN   Take 1 tablet (500 mg total) by mouth daily.      nicotine 14 mg/24hr patch   Commonly known as: NICODERM CQ - dosed in mg/24 hours   Place 1 patch onto the skin daily.      oxyCODONE 5 MG immediate release tablet   Commonly known as: Oxy IR/ROXICODONE   Take 1 tablet (5 mg total) by mouth every 4 (four) hours as needed for pain.            Discharged Condition: Stable and improved.    Consults: None.  Significant Diagnostic Studies: Ct Angio Chest W/cm &/or Wo Cm  01/09/2012  *RADIOLOGY REPORT*  Clinical Data: Sudden onset of dyspnea.  History of smoking, hysterectomy, cholecystectomy.  CT ANGIOGRAPHY CHEST  Technique:  Multidetector CT imaging of the chest using the standard protocol during bolus administration of intravenous contrast. Multiplanar reconstructed images including MIPs were obtained and reviewed to evaluate the vascular anatomy.  Contrast: OMNIPAQUE IOHEXOL 350 MG/ML IV SOLN, 1 OMNIPAQUE IOHEXOL 350 MG/ML IV SOLN  Comparison: 09/15/2011  Findings: The pulmonary arteries are well opacified.  There is no evidence for acute pulmonary embolus.  The heart is mildly enlarged.  There are dependent changes consistent with mild pulmonary edema.  No significant mediastinal, hilar, or axillary adenopathy.  Note is made of small nodule within the  minor fissure, stable in appearance and measuring approximately 5 mm on image 47 of series 7.  Images of the upper abdomen are unremarkable. Visualized osseous structures have a normal appearance.  IMPRESSION:  1.  Technically adequate exam showing no evidence for acute pulmonary embolus. 2.  Stable intrafissural nodule, likely a small lymph node on the right. 3.  Dependent changes consistent with mild pulmonary edema.  Original Report Authenticated By: Patterson Hammersmith, M.D.   Ct Abdomen Pelvis W Contrast  02/04/2012  *RADIOLOGY REPORT*  Clinical Data: Abdominal pain, nausea, vomiting.  CT ABDOMEN AND PELVIS WITH CONTRAST  Technique:  Multidetector CT imaging of the abdomen and pelvis was performed following the standard protocol during bolus administration of intravenous contrast.  Contrast: OMNIPAQUE IOHEXOL 300 MG/ML IV SOLN  Comparison: 06/14/2006  Findings: There are tiny bilateral pleural effusions.  There is airspace consolidation with air bronchograms in the posterior aspect of both lower lobes, left worse than right.  Vascular clips in the gallbladder fossa.  Unremarkable liver, spleen, adrenal glands, kidneys, pancreas. No hydronephrosis. Patchy aortoiliac arterial calcifications without aneurysm. Stomach and small bowel nondilated.  Normal appendix.  The colon is nondistended.  Vascular clips in the left pelvis.  No ascites.  No free air.  No mesenteric, retroperitoneal, or pelvic adenopathy. Portal vein patent.  Lumbar spine intact.  IMPRESSION:  1.  Bilateral lower lobe airspace disease suggesting pneumonia, with  tiny pleural effusions. 2.  No acute abdominal process. 3.  Aortoiliac vascular calcifications.  Original Report Authenticated By: Osa Craver, M.D.   Dg Chest Port 1 View  02/04/2012  *RADIOLOGY REPORT*  Clinical Data: Shortness of breath and weakness.  PORTABLE CHEST - 1 VIEW  Comparison: Chest radiograph and CTA of the chest performed 01/09/2012  Findings: Mild new  left basilar airspace opacity raises concern for mild pneumonia, given clinical concern.  Additional underlying dependent interstitial changes are stable from prior studies.  No definite pleural effusion or pneumothorax is seen.  Mild vascular congestion is noted.  The cardiomediastinal silhouette is borderline normal in size.  No acute osseous abnormalities are identified.  IMPRESSION: Mild new left basilar airspace opacity raises concern for mild pneumonia, given clinical concern.  Underlying interstitial changes are stable from recent prior studies; minimal edema could have this appearance.  Mild vascular congestion noted.  Original Report Authenticated By: Tonia Ghent, M.D.   Dg Chest Portable 1 View  01/09/2012  *RADIOLOGY REPORT*  Clinical Data: Shortness of breath.  PORTABLE CHEST - 1 VIEW  Comparison: 09/15/2011  Findings: Emphysema noted. Cardiac and mediastinal contours appear unremarkable.  Faint interstitial accentuation of the lung bases may be due to soft tissues of the chest wall but could also be seen in the setting of drug reaction or atypical pneumonia.  No compelling signs of edema.  IMPRESSION:  1.  Emphysema. 2.  Faint interstitial accentuation in the lung bases, at least partially due to distribution of soft tissues of the chest wall. The possibility of atypical pneumonia or drug reaction is also raised.  Original Report Authenticated By: Dellia Cloud, M.D.    Lab Results: Basic Metabolic Panel:  Basename 02/05/12 0506 02/04/12 0356  NA 144 138  K 4.0 3.2*  CL 113* 105  CO2 24 25  GLUCOSE 127* 145*  BUN 8 10  CREATININE 0.82 1.05  CALCIUM 9.1 9.8  MG 2.0 --  PHOS -- --   Liver Function Tests:  [facility] 02/05/12 0506 02/04/12 0356  AST 16 15  ALT 22 10  ALKPHOS 78 87  BILITOT 0.2* 0.4  PROT 5.8* 6.5  ALBUMIN 2.9* 3.7     CBC:  Basename 02/05/12 0506 02/04/12 0356  WBC 15.5* 11.4*  NEUTROABS -- 9.3*  HGB 11.9* 13.4  HCT 37.1 41.6  MCV 87.3 86.8    PLT 199 190    Recent Results (from the past 240 hour(s))  MRSA PCR SCREENING     Status: Normal   Collection Time   02/04/12  7:15 AM      Component Value Range Status Comment   MRSA by PCR NEGATIVE  NEGATIVE  Final      Hospital Course: This 56 year old lady was admitted with symptoms of nausea, vomiting and abdominal pain. The etiology of this was not entirely clear. She also complained of dyspnea with cough. Chest x-ray confirmed the presence of left lower lobe pneumonia. CT of the abdomen due to her abdominal pain did not show any abdominal process, in particular there was no evidence of abdominal aortic aneurysm. However, the CT scan of the abdomen did show bilateral lower lobe airspace disease consistent with pneumonia. She was treated with intravenous antibiotics. Overall, she has done well and has made a good improvement. She is keen to go home now.  Discharge Exam: Blood pressure 159/75, pulse 55, temperature 98 F (36.7 C), temperature source Oral, resp. rate 18, height 5\' 2"  (1.575 m), weight  56 kg (123 lb 7.3 oz), SpO2 98.00%. She looks systemically well. Lung fields are actually are clear with reduced air entry in both bases. There is no bronchial breathing. There are no crackles. Heart sounds are present and normal without murmurs. She is alert and orientated without any focal neurological signs.  Disposition: Home. She'll complete a one-week course of antibiotics for her pneumonia. She will need a repeat chest x-ray in a proximally 4-6 weeks to make sure that the infection is clearing. She will need to follow with her primary care physician in the next 1-2 weeks.  Discharge Orders    Future Orders Please Complete By Expires   Diet - low sodium heart healthy      Increase activity slowly         Follow-up Information    Follow up with Vivia Ewing, MD .         SignedWilson Singer Pager [phone]  02/05/2012, 9:54 AM

## 2012-02-28 DIAGNOSIS — M549 Dorsalgia, unspecified: Secondary | ICD-10-CM | POA: Diagnosis not present

## 2012-02-28 DIAGNOSIS — M62838 Other muscle spasm: Secondary | ICD-10-CM | POA: Diagnosis not present

## 2012-05-31 ENCOUNTER — Emergency Department (HOSPITAL_COMMUNITY): Payer: Medicare Other

## 2012-05-31 ENCOUNTER — Encounter (HOSPITAL_COMMUNITY): Payer: Self-pay | Admitting: Emergency Medicine

## 2012-05-31 ENCOUNTER — Emergency Department (HOSPITAL_COMMUNITY)
Admission: EM | Admit: 2012-05-31 | Discharge: 2012-05-31 | Disposition: A | Discharge status: Home / Self Care | Payer: Medicare Other | Attending: Emergency Medicine | Admitting: Emergency Medicine

## 2012-05-31 DIAGNOSIS — F17200 Nicotine dependence, unspecified, uncomplicated: Secondary | ICD-10-CM | POA: Diagnosis not present

## 2012-05-31 DIAGNOSIS — K5900 Constipation, unspecified: Secondary | ICD-10-CM | POA: Insufficient documentation

## 2012-05-31 MED ORDER — POLYETHYLENE GLYCOL 3350 17 GM/SCOOP PO POWD: 17.0000 g | Freq: Every day | ORAL | Status: AC

## 2012-05-31 MED ORDER — MAGNESIUM CITRATE PO SOLN
1.0000 | Freq: Once | ORAL | Status: AC
  Administered 2012-05-31: 1 via ORAL
  Filled 2012-05-31: qty 296

## 2012-05-31 NOTE — ED Provider Notes (Signed)
History     CSN: [id number]  Arrival date & time 05/31/12  0403   First MD Initiated Contact with Patient 05/31/12 [phone]      Chief Complaint  Patient presents with  . Constipation  . Back Pain    (Consider location/radiation/quality/duration/timing/severity/associated sxs/prior treatment) HPI History provided by patient. Having left sided abdominal discomfort with no bowel movement in the last 5 days. Patient has been taking ex-lax without relief and without bowel movements. Patient very concerned about this and presents here for further evaluation. Has remote history of abdominal hysterectomy but denies any nausea vomiting or diarrhea. No fevers or chills. No new medications. No history of constipation. Feels like she's been drinking enough fluids. Symptoms moderate in severity. No known aggravating or alleviating factors. Past Medical History  Diagnosis Date  . COPD (chronic obstructive pulmonary disease)     Past Surgical History  Procedure Date  . Abdominal hysterectomy   . Cholecystectomy     No family history on file.  History  Substance Use Topics  . Smoking status: Current Everyday Smoker -- 0.5 packs/day  . Smokeless tobacco: Not on file  . Alcohol Use: No    OB History    Grav Para Term Preterm Abortions TAB SAB Ect Mult Living                  Review of Systems  Constitutional: Negative for fever and chills.  HENT: Negative for neck pain and neck stiffness.   Eyes: Negative for pain.  Respiratory: Negative for shortness of breath.   Cardiovascular: Negative for chest pain.  Gastrointestinal: Positive for constipation. Negative for vomiting, blood in stool, abdominal distention and anal bleeding.  Genitourinary: Negative for dysuria and urgency.  Skin: Negative for rash.  Neurological: Negative for headaches.  All other systems reviewed and are negative.    Allergies  Review of patient's allergies indicates no known allergies.  Home Medications    No current outpatient prescriptions on file.  BP 129/86  Pulse 64  Temp 97.7 F (36.5 C) (Oral)  Resp 18  Ht 5\' 1"  (1.549 m)  Wt 126 lb (57.153 kg)  BMI 23.81 kg/m2  SpO2 100%  Physical Exam  Constitutional: She is oriented to person, place, and time. She appears well-developed and well-nourished.  HENT:  Head: Normocephalic and atraumatic.  Eyes: Conjunctivae and EOM are normal. Pupils are equal, round, and reactive to light.  Neck: Trachea normal. Neck supple. No thyromegaly present.  Cardiovascular: Normal rate, regular rhythm, S1 normal, S2 normal and normal pulses.     No systolic murmur is present   No diastolic murmur is present  Pulses:      Radial pulses are 2+ on the right side, and 2+ on the left side.  Pulmonary/Chest: Effort normal and breath sounds normal. She has no wheezes. She has no rhonchi. She has no rales. She exhibits no tenderness.  Abdominal: Soft. Normal appearance and bowel sounds are normal. She exhibits no distension and no mass. There is no tenderness. There is no rebound, no guarding, no CVA tenderness and negative Murphy's sign.  Musculoskeletal:       BLE:s Calves nontender, no cords or erythema, negative Homans sign  Neurological: She is alert and oriented to person, place, and time. She has normal strength. No cranial nerve deficit or sensory deficit. GCS eye subscore is 4. GCS verbal subscore is 5. GCS motor subscore is 6.  Skin: Skin is warm and dry. No rash noted. She is  not diaphoretic.  Psychiatric: Her speech is normal.       Cooperative and appropriate    ED Course  Procedures (including critical care time)   Dg Abd Acute W/chest  05/31/2012  *RADIOLOGY REPORT*  Clinical Data: Constipation, COPD.  ACUTE ABDOMEN SERIES (ABDOMEN 2 VIEW & CHEST 1 VIEW)  Comparison: 02/04/2012 CT  Findings: There are mild bibasilar opacities.  Heart size upper normal to mildly enlarged.  Mild aortic tortuosity.  No pneumothorax or pleural effusion.   Surgical clips right upper quadrant.  Surgical clips left pelvis. No free intraperitoneal air.  Nonobstructive bowel gas pattern.  No acute osseous finding. Bone island right intertrochanteric femur.  IMPRESSION: Nonobstructive bowel gas pattern.  Mild bibasilar opacities; atelectasis versus infiltrate.  Original Report Authenticated By: Waneta Martins, M.D.    Magnesium citrate provided.   MDM   Constipation with imaging as above. X-ray reviewed by myself and radiology results reviewed. Serial abdominal exams remained unchanged: Soft nontender no peritonitis.  Plan prescription for MiraLAX and followup with primary care physician in 2 days for recheck. Reliable historian verbalizes understanding abdominal pain and constipation precautions and agrees to discharge and followup instructions.        Sunnie Nielsen, MD 05/31/12 [phone]

## 2012-05-31 NOTE — Discharge Instructions (Signed)

## 2012-05-31 NOTE — ED Notes (Signed)
Patient c/o constipation x1 week; states has used exlax without relief.  Also c/o lower back pain x 3 days.

## 2012-10-09 ENCOUNTER — Emergency Department (HOSPITAL_COMMUNITY)
Admission: EM | Admit: 2012-10-09 | Discharge: 2012-10-09 | Disposition: A | Discharge status: Home / Self Care | Payer: Medicare Other | Attending: Emergency Medicine | Admitting: Emergency Medicine

## 2012-10-09 ENCOUNTER — Emergency Department (HOSPITAL_COMMUNITY): Payer: Medicare Other

## 2012-10-09 ENCOUNTER — Encounter (HOSPITAL_COMMUNITY): Payer: Self-pay

## 2012-10-09 DIAGNOSIS — R079 Chest pain, unspecified: Secondary | ICD-10-CM | POA: Diagnosis not present

## 2012-10-09 DIAGNOSIS — J449 Chronic obstructive pulmonary disease, unspecified: Secondary | ICD-10-CM | POA: Diagnosis not present

## 2012-10-09 DIAGNOSIS — J069 Acute upper respiratory infection, unspecified: Secondary | ICD-10-CM | POA: Insufficient documentation

## 2012-10-09 DIAGNOSIS — F17200 Nicotine dependence, unspecified, uncomplicated: Secondary | ICD-10-CM | POA: Diagnosis not present

## 2012-10-09 DIAGNOSIS — J4489 Other specified chronic obstructive pulmonary disease: Secondary | ICD-10-CM | POA: Insufficient documentation

## 2012-10-09 DIAGNOSIS — R062 Wheezing: Secondary | ICD-10-CM | POA: Diagnosis not present

## 2012-10-09 MED ORDER — GUAIFENESIN-CODEINE 100-10 MG/5ML PO SOLN
5.0000 mL | Freq: Once | ORAL | Status: AC
  Administered 2012-10-09: 5 mL via ORAL
  Filled 2012-10-09: qty 5

## 2012-10-09 MED ORDER — DEXTROMETHORPHAN HBR 15 MG/5ML PO SYRP: 10.0000 mL | ORAL_SOLUTION | Freq: Four times a day (QID) | ORAL | Status: DC | PRN

## 2012-10-09 MED ORDER — AZITHROMYCIN 250 MG PO TABS: ORAL_TABLET | ORAL | Status: DC

## 2012-10-09 MED ORDER — ALBUTEROL SULFATE (5 MG/ML) 0.5% IN NEBU
2.5000 mg | INHALATION_SOLUTION | Freq: Once | RESPIRATORY_TRACT | Status: AC
  Administered 2012-10-09: 2.5 mg via RESPIRATORY_TRACT
  Filled 2012-10-09: qty 0.5

## 2012-10-09 MED ORDER — ALBUTEROL SULFATE HFA 108 (90 BASE) MCG/ACT IN AERS: 1.0000 | INHALATION_SPRAY | Freq: Four times a day (QID) | RESPIRATORY_TRACT | Status: DC | PRN

## 2012-10-09 MED ORDER — AZITHROMYCIN 250 MG PO TABS
500.0000 mg | ORAL_TABLET | Freq: Once | ORAL | Status: AC
  Administered 2012-10-09: 500 mg via ORAL
  Filled 2012-10-09: qty 2

## 2012-10-09 NOTE — ED Provider Notes (Signed)
History     CSN: [id number]  Arrival date & time 10/09/12  0038   First MD Initiated Contact with Patient 10/09/12 0126      Chief Complaint  Patient presents with  . Cough  . URI    (Consider location/radiation/quality/duration/timing/severity/associated sxs/prior treatment) HPI  Virginia Washington is a 56 y.o. female who presents to the Emergency Department complaining of upper respiratory symptoms x two days with cough, wheezing, chest discomfort, nasal congestion. Denies fever, chills, nausea, vomiting. She has taken no medicines.   PCP Dr. Milford Cage  Past Medical History  Diagnosis Date  . COPD (chronic obstructive pulmonary disease)     Past Surgical History  Procedure Date  . Abdominal hysterectomy   . Cholecystectomy     No family history on file.  History  Substance Use Topics  . Smoking status: Current Every Day Smoker -- 0.5 packs/day  . Smokeless tobacco: Not on file  . Alcohol Use: No    OB History    Grav Para Term Preterm Abortions TAB SAB Ect Mult Living                  Review of Systems  Constitutional: Negative for fever.       10 Systems reviewed and are negative for acute change except as noted in the HPI.  HENT: Positive for congestion.   Eyes: Negative for discharge and redness.  Respiratory: Positive for cough and chest tightness. Negative for shortness of breath.   Cardiovascular: Negative for chest pain.  Gastrointestinal: Negative for vomiting and abdominal pain.  Musculoskeletal: Negative for back pain.  Skin: Negative for rash.  Neurological: Negative for syncope, numbness and headaches.  Psychiatric/Behavioral:       No behavior change.    Allergies  Review of patient's allergies indicates no known allergies.  Home Medications  No current outpatient prescriptions on file.  BP 130/78  Pulse 76  Temp 98.3 F (36.8 C) (Oral)  Resp 18  Ht 5\' 5"  (1.651 m)  Wt 130 lb (58.968 kg)  BMI 21.63 kg/m2  SpO2 99%  Physical Exam   Nursing note and vitals reviewed. Constitutional: She appears well-developed and well-nourished.       Awake, alert, nontoxic appearance.  HENT:  Head: Atraumatic.  Eyes: Right eye exhibits no discharge. Left eye exhibits no discharge.  Neck: Neck supple.  Cardiovascular: Normal heart sounds.   Pulmonary/Chest: Effort normal. She has wheezes. She exhibits no tenderness.       Coughing, end expiratory wheezes  Abdominal: Soft. There is no tenderness. There is no rebound.  Musculoskeletal: She exhibits no tenderness.       Baseline ROM, no obvious new focal weakness.  Neurological:       Mental status and motor strength appears baseline for patient and situation.  Skin: No rash noted.  Psychiatric: She has a normal mood and affect.    ED Course  Procedures (including critical care time)  Labs Reviewed - No data to display Dg Chest 2 View  10/09/2012  *RADIOLOGY REPORT*  Clinical Data: Cough and chest pain.  CHEST - 2 VIEW  Comparison: 02/04/2012 and CT from 01/09/2012  Findings: Two views of the chest were obtained.  Slightly increased densities in the lower chest may be related to overlying breast tissue.  There is no focal airspace disease or edema. Heart and mediastinum are within normal limits.  Trachea is midline.  No evidence for pleural effusions.  IMPRESSION: No acute chest findings.  Original Report Authenticated By: Richarda Overlie, M.D.      0300 Patient feeling better since receiving cough medicine and albuterol nebulizer. Wheezing has resolved.   MDM  Patient with URI and wheezing. Received cough medicine and albuterol nebulizer treatment with improvement. Initiated antibiotic therapy. Pt stable in ED with no significant deterioration in condition.The patient appears reasonably screened and/or stabilized for discharge and I doubt any other medical condition or other [facility] requiring further screening, evaluation, or treatment in the ED at this time prior to  discharge.  MDM Reviewed: vitals and nursing note Interpretation: x-ray           Nicoletta Dress. Colon Branch, MD 10/09/12 [phone]

## 2012-10-09 NOTE — ED Notes (Signed)
Cough, chest congestion per pt. Have been sick for two days per pt.

## 2012-12-06 ENCOUNTER — Encounter (HOSPITAL_COMMUNITY): Payer: Self-pay | Admitting: Emergency Medicine

## 2012-12-06 ENCOUNTER — Emergency Department: Admit: 2012-12-06 | Payer: Self-pay

## 2012-12-06 ENCOUNTER — Emergency Department (HOSPITAL_COMMUNITY): Payer: Medicare Other

## 2012-12-06 ENCOUNTER — Emergency Department (HOSPITAL_COMMUNITY)
Admission: EM | Admit: 2012-12-06 | Discharge: 2012-12-07 | Disposition: A | Discharge status: Home / Self Care | Payer: Medicare Other | Attending: Emergency Medicine | Admitting: Emergency Medicine

## 2012-12-06 DIAGNOSIS — J449 Chronic obstructive pulmonary disease, unspecified: Secondary | ICD-10-CM | POA: Insufficient documentation

## 2012-12-06 DIAGNOSIS — R0789 Other chest pain: Secondary | ICD-10-CM | POA: Diagnosis not present

## 2012-12-06 DIAGNOSIS — R509 Fever, unspecified: Secondary | ICD-10-CM | POA: Insufficient documentation

## 2012-12-06 DIAGNOSIS — J9819 Other pulmonary collapse: Secondary | ICD-10-CM | POA: Diagnosis not present

## 2012-12-06 DIAGNOSIS — R05 Cough: Secondary | ICD-10-CM | POA: Insufficient documentation

## 2012-12-06 DIAGNOSIS — Z79899 Other long term (current) drug therapy: Secondary | ICD-10-CM | POA: Diagnosis not present

## 2012-12-06 DIAGNOSIS — J4489 Other specified chronic obstructive pulmonary disease: Secondary | ICD-10-CM | POA: Insufficient documentation

## 2012-12-06 DIAGNOSIS — M549 Dorsalgia, unspecified: Secondary | ICD-10-CM | POA: Insufficient documentation

## 2012-12-06 DIAGNOSIS — R110 Nausea: Secondary | ICD-10-CM | POA: Diagnosis not present

## 2012-12-06 DIAGNOSIS — F17200 Nicotine dependence, unspecified, uncomplicated: Secondary | ICD-10-CM | POA: Diagnosis not present

## 2012-12-06 DIAGNOSIS — J209 Acute bronchitis, unspecified: Secondary | ICD-10-CM

## 2012-12-06 DIAGNOSIS — R059 Cough, unspecified: Secondary | ICD-10-CM | POA: Insufficient documentation

## 2012-12-06 LAB — CBC WITH DIFFERENTIAL/PLATELET
Eosinophils Relative: 1 % (ref 0–5)
HCT: 42.6 % (ref 36.0–46.0)
Hemoglobin: 14 g/dL (ref 12.0–15.0)
Lymphocytes Relative: 36 % (ref 12–46)
Lymphs Abs: 2.5 10*3/uL (ref 0.7–4.0)
MCV: 86.4 fL (ref 78.0–100.0)
Monocytes Absolute: 0.7 10*3/uL (ref 0.1–1.0)
Monocytes Relative: 10 % (ref 3–12)
RBC: 4.93 MIL/uL (ref 3.87–5.11)
WBC: 7 10*3/uL (ref 4.0–10.5)

## 2012-12-06 LAB — COMPREHENSIVE METABOLIC PANEL
CO2: 26 mEq/L (ref 19–32)
Calcium: 9.4 mg/dL (ref 8.4–10.5)
Creatinine, Ser: 0.96 mg/dL (ref 0.50–1.10)
GFR calc Af Amer: 76 mL/min — ABNORMAL LOW (ref >90)
GFR calc non Af Amer: 66 mL/min — ABNORMAL LOW (ref >90)
Glucose, Bld: 84 mg/dL (ref 70–99)
Total Bilirubin: 0.4 mg/dL (ref 0.3–1.2)

## 2012-12-06 MED ORDER — AZITHROMYCIN 250 MG PO TABS: 250.0000 mg | ORAL_TABLET | Freq: Every day | ORAL | Status: DC

## 2012-12-06 MED ORDER — KETOROLAC TROMETHAMINE 30 MG/ML IJ SOLN
30.0000 mg | Freq: Once | INTRAMUSCULAR | Status: AC
  Administered 2012-12-06: 30 mg via INTRAVENOUS

## 2012-12-06 MED ORDER — MORPHINE SULFATE 4 MG/ML IJ SOLN
INTRAMUSCULAR | Status: AC
  Filled 2012-12-06: qty 1

## 2012-12-06 MED ORDER — HYDROCODONE-ACETAMINOPHEN 5-325 MG PO TABS: 2.0000 | ORAL_TABLET | ORAL | Status: DC | PRN

## 2012-12-06 MED ORDER — KETOROLAC TROMETHAMINE 30 MG/ML IJ SOLN
INTRAMUSCULAR | Status: AC
  Filled 2012-12-06: qty 1

## 2012-12-06 MED ORDER — MORPHINE SULFATE 4 MG/ML IJ SOLN
4.0000 mg | Freq: Once | INTRAMUSCULAR | Status: AC
  Administered 2012-12-06: 4 mg via INTRAVENOUS

## 2012-12-06 NOTE — ED Notes (Signed)
EKG performed at 1956, given to Dr.McManus who stated patient was okay to wait in waiting room at this time. Delay explained to patient.

## 2012-12-06 NOTE — ED Notes (Signed)
Patient complaining of right sided chest pain that radiates to left arm. Also reports right sided neck pain and upper back pain. Reports shortness of breath, lightheadedness, and nausea which is all intermittent with the chest pain.

## 2012-12-06 NOTE — ED Provider Notes (Signed)
History  This chart was scribed for Geoffery Lyons, MD by Bennett Scrape, ED Scribe. This patient was seen in room APA05/APA05 and the patient's care was started at 9:43 PM.  CSN: [id number]  Arrival date & time 12/06/12  1947   First MD Initiated Contact with Patient 12/06/12 2143      Chief Complaint  Patient presents with  . Chest Pain  . Back Pain     The history is provided by the patient. No language interpreter was used.   Virginia Washington is a 56 y.o. female who presents to the Emergency Department complaining of gradual onset, gradually worsening, intermittent left-sided CP described as sharp that radiates down the left arm that started today with associated 2 to 3 days of non-productive cough, fever, nausea, SOB and myalgias. Temperature was measured at 98.8 in the ED. She denies taking OTC medications at home to improve symptoms and she denies having any sick contacts with similar symptoms. She states that she had similar symptoms last week that improved but started again 2-3 days ago. Daughter states that the pt has PNA every two months and pt reports similarities between symptoms. She denies having a h/o heart related conditions. She denies emesis, leg swelling and diarrhea as associated symptoms. She has a h/o COPD. She is a current everyday smoker but denies alcohol use.  Past Medical History  Diagnosis Date  . COPD (chronic obstructive pulmonary disease)     Past Surgical History  Procedure Date  . Abdominal hysterectomy   . Cholecystectomy     History reviewed. No pertinent family history.  History  Substance Use Topics  . Smoking status: Current Every Day Smoker -- 0.5 packs/day  . Smokeless tobacco: Not on file  . Alcohol Use: No    No OB history provided.  Review of Systems  Constitutional: Positive for fever. Negative for chills.  Respiratory: Positive for cough. Negative for shortness of breath.   Cardiovascular: Positive for chest pain. Negative  for leg swelling.  Gastrointestinal: Positive for nausea. Negative for vomiting, abdominal pain and diarrhea.  All other systems reviewed and are negative.    Allergies  Review of patient's allergies indicates no known allergies.  Home Medications   Current Outpatient Rx  Name  Route  Sig  Dispense  Refill  . ALBUTEROL SULFATE HFA 108 (90 BASE) MCG/ACT IN AERS   Inhalation   Inhale 1-2 puffs into the lungs every 6 (six) hours as needed for wheezing.   1 Inhaler   0     Triage Vitals: BP 132/84  Pulse 76  Temp 98.8 F (37.1 C) (Oral)  Resp 20  Ht 5\' 1"  (1.549 m)  Wt 130 lb (58.968 kg)  BMI 24.56 kg/m2  SpO2 100%  Physical Exam  Nursing note and vitals reviewed. Constitutional: She is oriented to person, place, and time. She appears well-developed and well-nourished. No distress.  HENT:  Head: Normocephalic and atraumatic.  Mouth/Throat: Oropharynx is clear and moist.  Eyes: Conjunctivae normal and EOM are normal. Pupils are equal, round, and reactive to light.  Neck: Normal range of motion. Neck supple. No tracheal deviation present.  Cardiovascular: Normal rate and regular rhythm.   No murmur heard. Pulmonary/Chest: Effort normal and breath sounds normal. No respiratory distress.  Abdominal: Soft. There is no tenderness.  Musculoskeletal: Normal range of motion. She exhibits no edema.  Neurological: She is alert and oriented to person, place, and time.  Skin: Skin is warm and dry.  Psychiatric: She  has a normal mood and affect. Her behavior is normal.    ED Course  Procedures (including critical care time)  DIAGNOSTIC STUDIES: Oxygen Saturation is 100% on room air, normal by my interpretation.    COORDINATION OF CARE: 10:01 PM-Discussed treatment plan which includes CBC, CXR and heart enzymes with pt at bedside and pt agreed to plan.   Labs Reviewed  COMPREHENSIVE METABOLIC PANEL - Abnormal; Notable for the following:    GFR calc non Af Amer 66 (*)      GFR calc Af Amer 76 (*)     All other components within normal limits  CBC WITH DIFFERENTIAL  TROPONIN I   Dg Chest Port 1 View  12/06/2012  *RADIOLOGY REPORT*  Clinical Data: Chest pain.  Cough.  Weakness.  PORTABLE CHEST - 1 VIEW  Comparison: 10/09/2012.  Findings:  Cardiopericardial silhouette within normal limits. Mediastinal contours normal. Trachea midline.  No airspace disease or effusion.  Mild basilar atelectasis. Monitoring leads are projected over the chest.  IMPRESSION: No acute cardiopulmonary disease.   Original Report Authenticated By: Andreas Newport, M.D.      No diagnosis found.   Date: 12/06/2012  Rate: 71  Rhythm: normal sinus rhythm  QRS Axis: normal  Intervals: normal  ST/T Wave abnormalities: normal  Conduction Disutrbances:none  Narrative Interpretation:   Old EKG Reviewed: none available    MDM  The patient presents here with symptoms, exam, and workup that are inconsistent with cardiac pain.  She reports cough congestion and I believe this is respiratory in nature.  She will be treated with zmax, return prn if she worsens.        I personally performed the services described in this documentation, which was scribed in my presence. The recorded information has been reviewed and is accurate.      Geoffery Lyons, MD 12/06/12 [phone]

## 2012-12-07 MED ORDER — GI COCKTAIL ~~LOC~~
ORAL | Status: AC
  Administered 2012-12-06: 30 mL
  Filled 2012-12-07: qty 30

## 2012-12-08 ENCOUNTER — Emergency Department (HOSPITAL_COMMUNITY)
Admission: EM | Admit: 2012-12-08 | Discharge: 2012-12-08 | Disposition: A | Discharge status: Home / Self Care | Payer: Medicare Other | Attending: Emergency Medicine | Admitting: Emergency Medicine

## 2012-12-08 ENCOUNTER — Encounter (HOSPITAL_COMMUNITY): Payer: Self-pay | Admitting: *Deleted

## 2012-12-08 DIAGNOSIS — R6889 Other general symptoms and signs: Secondary | ICD-10-CM | POA: Insufficient documentation

## 2012-12-08 DIAGNOSIS — R059 Cough, unspecified: Secondary | ICD-10-CM

## 2012-12-08 DIAGNOSIS — R05 Cough: Secondary | ICD-10-CM | POA: Diagnosis not present

## 2012-12-08 DIAGNOSIS — J3489 Other specified disorders of nose and nasal sinuses: Secondary | ICD-10-CM | POA: Diagnosis not present

## 2012-12-08 DIAGNOSIS — R112 Nausea with vomiting, unspecified: Secondary | ICD-10-CM | POA: Diagnosis not present

## 2012-12-08 DIAGNOSIS — R0602 Shortness of breath: Secondary | ICD-10-CM | POA: Insufficient documentation

## 2012-12-08 DIAGNOSIS — R51 Headache: Secondary | ICD-10-CM | POA: Insufficient documentation

## 2012-12-08 DIAGNOSIS — F17200 Nicotine dependence, unspecified, uncomplicated: Secondary | ICD-10-CM | POA: Insufficient documentation

## 2012-12-08 DIAGNOSIS — J449 Chronic obstructive pulmonary disease, unspecified: Secondary | ICD-10-CM | POA: Insufficient documentation

## 2012-12-08 DIAGNOSIS — M791 Myalgia, unspecified site: Secondary | ICD-10-CM

## 2012-12-08 DIAGNOSIS — R110 Nausea: Secondary | ICD-10-CM

## 2012-12-08 DIAGNOSIS — IMO0001 Reserved for inherently not codable concepts without codable children: Secondary | ICD-10-CM | POA: Diagnosis not present

## 2012-12-08 DIAGNOSIS — J029 Acute pharyngitis, unspecified: Secondary | ICD-10-CM | POA: Diagnosis not present

## 2012-12-08 DIAGNOSIS — J4489 Other specified chronic obstructive pulmonary disease: Secondary | ICD-10-CM | POA: Insufficient documentation

## 2012-12-08 MED ORDER — ONDANSETRON 8 MG PO TBDP
8.0000 mg | ORAL_TABLET | Freq: Once | ORAL | Status: AC
  Administered 2012-12-08: 8 mg via ORAL
  Filled 2012-12-08: qty 1

## 2012-12-08 MED ORDER — HYDROCODONE-ACETAMINOPHEN 5-325 MG PO TABS
2.0000 | ORAL_TABLET | Freq: Once | ORAL | Status: AC
  Administered 2012-12-08: 2 via ORAL
  Filled 2012-12-08: qty 2

## 2012-12-08 NOTE — ED Notes (Addendum)
Pt reporting headache, generalized body aches, and occasional SOB.  Reports was seen last night for same, no relief. States she did not pick scrips off from pharmacy.

## 2012-12-08 NOTE — ED Notes (Signed)
Family at bedside. Patient states she is just waiting on discharge paperwork at this time. RN made aware.

## 2012-12-08 NOTE — ED Notes (Signed)
States that she was seen here last night for body aches and now she reports coughing until she vomits.  Cough is nonproductive.

## 2012-12-08 NOTE — ED Notes (Signed)
Pt states she refuses to wait on her discharge instructions any longer, ambulatory out of department, MD made aware.

## 2012-12-14 NOTE — ED Provider Notes (Signed)
History     CSN: [id number]  Arrival date & time 12/08/12  0136   First MD Initiated Contact with Patient 12/08/12 0327      No chief complaint on file.   (Consider location/radiation/quality/duration/timing/severity/associated sxs/prior treatment) HPI Virginia Washington is a 56 y.o. female with a h/o COPD who presents to the Emergency Department complaining of persistent cough, shortness of breath with the cough, body aches, headache and vomiting with the coughing. She was seen for the same symptoms yesterday and had not yet picked up prescriptions at the pharmacy. She denies fever, chest pain.   Past Medical History  Diagnosis Date  . COPD (chronic obstructive pulmonary disease)     Past Surgical History  Procedure Date  . Abdominal hysterectomy   . Cholecystectomy     No family history on file.  History  Substance Use Topics  . Smoking status: Current Every Day Smoker -- 0.5 packs/day  . Smokeless tobacco: Not on file  . Alcohol Use: No    OB History    Grav Para Term Preterm Abortions TAB SAB Ect Mult Living                  Review of Systems  Constitutional: Negative for fever.       10 Systems reviewed and are negative for acute change except as noted in the HPI.  HENT: Positive for congestion, sore throat and sneezing.   Eyes: Negative for discharge and redness.  Respiratory: Positive for cough and shortness of breath.   Cardiovascular: Negative for chest pain.  Gastrointestinal: Positive for nausea and vomiting. Negative for abdominal pain.  Musculoskeletal: Negative for back pain.  Skin: Negative for rash.  Neurological: Negative for syncope, numbness and headaches.  Psychiatric/Behavioral:       No behavior change.    Allergies  Review of patient's allergies indicates no known allergies.  Home Medications   Current Outpatient Rx  Name  Route  Sig  Dispense  Refill  . ALBUTEROL SULFATE HFA 108 (90 BASE) MCG/ACT IN AERS   Inhalation   Inhale  1-2 puffs into the lungs every 6 (six) hours as needed for wheezing.   1 Inhaler   0   . AZITHROMYCIN 250 MG PO TABS   Oral   Take 1 tablet (250 mg total) by mouth daily. Take first 2 tablets together, then 1 every day until finished.   6 tablet   0   . HYDROCODONE-ACETAMINOPHEN 5-325 MG PO TABS   Oral   Take 2 tablets by mouth every 4 (four) hours as needed for pain.   12 tablet   0     BP 108/68  Pulse 80  Temp 99.3 F (37.4 C) (Oral)  Resp 14  Ht 5' (1.524 m)  Wt 130 lb (58.968 kg)  BMI 25.39 kg/m2  SpO2 98%  Physical Exam  Nursing note and vitals reviewed. Constitutional: She appears well-developed and well-nourished.       Awake, alert, nontoxic appearance.  HENT:  Head: Atraumatic.  Right Ear: External ear normal.  Left Ear: External ear normal.  Nose: Nose normal.  Mouth/Throat: Oropharynx is clear and moist.  Eyes: Right eye exhibits no discharge. Left eye exhibits no discharge.  Neck: Neck supple.  Cardiovascular: Normal heart sounds.   Pulmonary/Chest: Effort normal. She exhibits no tenderness.       Coarse breath sounds, no wheezing  Abdominal: Soft. There is no tenderness. There is no rebound.  Musculoskeletal: She exhibits no tenderness.  Baseline ROM, no obvious new focal weakness.  Neurological:       Mental status and motor strength appears baseline for patient and situation.  Skin: No rash noted.  Psychiatric: She has a normal mood and affect.    ED Course  Procedures (including critical care time)  Results for orders placed during the hospital encounter of 12/06/12  CBC WITH DIFFERENTIAL      Component Value Range   WBC 7.0  4.0 - 10.5 K/uL   RBC 4.93  3.87 - 5.11 MIL/uL   Hemoglobin 14.0  12.0 - 15.0 g/dL   HCT 16.1  09.6 - 04.5 %   MCV 86.4  78.0 - 100.0 fL   MCH 28.4  26.0 - 34.0 pg   MCHC 32.9  30.0 - 36.0 g/dL   RDW 40.9  81.1 - 91.4 %   Platelets 217  150 - 400 K/uL   Neutrophils Relative 52  43 - 77 %   Neutro Abs 3.6   1.7 - 7.7 K/uL   Lymphocytes Relative 36  12 - 46 %   Lymphs Abs 2.5  0.7 - 4.0 K/uL   Monocytes Relative 10  3 - 12 %   Monocytes Absolute 0.7  0.1 - 1.0 K/uL   Eosinophils Relative 1  0 - 5 %   Eosinophils Absolute 0.1  0.0 - 0.7 K/uL   Basophils Relative 1  0 - 1 %   Basophils Absolute 0.0  0.0 - 0.1 K/uL  COMPREHENSIVE METABOLIC PANEL      Component Value Range   Sodium 139  135 - 145 mEq/L   Potassium 3.8  3.5 - 5.1 mEq/L   Chloride 104  96 - 112 mEq/L   CO2 26  19 - 32 mEq/L   Glucose, Bld 84  70 - 99 mg/dL   BUN 8  6 - 23 mg/dL   Creatinine, Ser 7.82  0.50 - 1.10 mg/dL   Calcium 9.4  8.4 - 95.6 mg/dL   Total Protein 6.9  6.0 - 8.3 g/dL   Albumin 3.9  3.5 - 5.2 g/dL   AST 15  0 - 37 U/L   ALT 12  0 - 35 U/L   Alkaline Phosphatase 100  39 - 117 U/L   Total Bilirubin 0.4  0.3 - 1.2 mg/dL   GFR calc non Af Amer 66 (*) >90 mL/min   GFR calc Af Amer 76 (*) >90 mL/min  TROPONIN I      Component Value Range   Troponin I <0.30  <0.30 ng/mL     0430 Reviewed labs from visit 12/06/12.    1. Nausea   2. Myalgia       MDM  Patient with viral symptoms; cough, sore throat, nasal congestion, myalgias, and nausea with unremarkable labs. Given zofran and hydrocodone. Pt stable in ED with no significant deterioration in condition.The patient appears reasonably screened and/or stabilized for discharge and I doubt any other medical condition or other [facility] requiring further screening, evaluation, or treatment in the ED at this time prior to discharge.  MDM Reviewed: nursing note, vitals and previous chart Reviewed previous: labs, x-ray and ECG          Aurther Loft S. Colon Branch, MD 12/14/12 [phone]

## 2013-01-13 ENCOUNTER — Encounter (HOSPITAL_COMMUNITY): Payer: Self-pay | Admitting: *Deleted

## 2013-01-13 ENCOUNTER — Emergency Department (HOSPITAL_COMMUNITY)
Admission: EM | Admit: 2013-01-13 | Discharge: 2013-01-13 | Disposition: A | Discharge status: Home / Self Care | Payer: Medicare Other | Attending: Emergency Medicine | Admitting: Emergency Medicine

## 2013-01-13 ENCOUNTER — Emergency Department (HOSPITAL_COMMUNITY): Payer: Medicare Other

## 2013-01-13 DIAGNOSIS — J449 Chronic obstructive pulmonary disease, unspecified: Secondary | ICD-10-CM | POA: Diagnosis not present

## 2013-01-13 DIAGNOSIS — Z79899 Other long term (current) drug therapy: Secondary | ICD-10-CM | POA: Diagnosis not present

## 2013-01-13 DIAGNOSIS — A5909 Other urogenital trichomoniasis: Secondary | ICD-10-CM | POA: Diagnosis not present

## 2013-01-13 DIAGNOSIS — R059 Cough, unspecified: Secondary | ICD-10-CM | POA: Diagnosis not present

## 2013-01-13 DIAGNOSIS — R209 Unspecified disturbances of skin sensation: Secondary | ICD-10-CM | POA: Diagnosis not present

## 2013-01-13 DIAGNOSIS — A5903 Trichomonal cystitis and urethritis: Secondary | ICD-10-CM

## 2013-01-13 DIAGNOSIS — R202 Paresthesia of skin: Secondary | ICD-10-CM

## 2013-01-13 DIAGNOSIS — R079 Chest pain, unspecified: Secondary | ICD-10-CM | POA: Diagnosis not present

## 2013-01-13 DIAGNOSIS — N390 Urinary tract infection, site not specified: Secondary | ICD-10-CM | POA: Diagnosis not present

## 2013-01-13 DIAGNOSIS — R05 Cough: Secondary | ICD-10-CM | POA: Diagnosis not present

## 2013-01-13 DIAGNOSIS — F17200 Nicotine dependence, unspecified, uncomplicated: Secondary | ICD-10-CM | POA: Insufficient documentation

## 2013-01-13 DIAGNOSIS — J4489 Other specified chronic obstructive pulmonary disease: Secondary | ICD-10-CM | POA: Insufficient documentation

## 2013-01-13 DIAGNOSIS — R51 Headache: Secondary | ICD-10-CM | POA: Diagnosis not present

## 2013-01-13 LAB — TROPONIN I: Troponin I: <0.3 ng/mL (ref <0.30)

## 2013-01-13 LAB — COMPREHENSIVE METABOLIC PANEL
ALT: 9 U/L (ref 0–35)
AST: 12 U/L (ref 0–37)
Alkaline Phosphatase: 90 U/L (ref 39–117)
CO2: 25 mEq/L (ref 19–32)
Chloride: 104 mEq/L (ref 96–112)
GFR calc Af Amer: 78 mL/min — ABNORMAL LOW (ref >90)
GFR calc non Af Amer: 68 mL/min — ABNORMAL LOW (ref >90)
Glucose, Bld: 96 mg/dL (ref 70–99)
Potassium: 3.7 mEq/L (ref 3.5–5.1)
Sodium: 140 mEq/L (ref 135–145)
Total Bilirubin: 0.2 mg/dL — ABNORMAL LOW (ref 0.3–1.2)

## 2013-01-13 LAB — CBC WITH DIFFERENTIAL/PLATELET
Basophils Absolute: 0 10*3/uL (ref 0.0–0.1)
Basophils Relative: 1 % (ref 0–1)
Eosinophils Relative: 2 % (ref 0–5)
Lymphocytes Relative: 55 % — ABNORMAL HIGH (ref 12–46)
MCHC: 32.6 g/dL (ref 30.0–36.0)
MCV: 86.4 fL (ref 78.0–100.0)
Neutro Abs: 2.4 10*3/uL (ref 1.7–7.7)
Platelets: 227 10*3/uL (ref 150–400)
RDW: 14.4 % (ref 11.5–15.5)
WBC: 6.9 10*3/uL (ref 4.0–10.5)

## 2013-01-13 LAB — URINALYSIS, ROUTINE W REFLEX MICROSCOPIC
Bilirubin Urine: NEGATIVE
Ketones, ur: NEGATIVE mg/dL
Nitrite: NEGATIVE
Urobilinogen, UA: 0.2 mg/dL (ref 0.0–1.0)
pH: 6 (ref 5.0–8.0)

## 2013-01-13 MED ORDER — TRAMADOL HCL 50 MG PO TABS: 50.0000 mg | ORAL_TABLET | Freq: Four times a day (QID) | ORAL | Status: DC | PRN

## 2013-01-13 MED ORDER — CEPHALEXIN 500 MG PO CAPS: 500.0000 mg | ORAL_CAPSULE | Freq: Four times a day (QID) | ORAL | Status: DC

## 2013-01-13 MED ORDER — METRONIDAZOLE 500 MG PO TABS
2000.0000 mg | ORAL_TABLET | Freq: Once | ORAL | Status: AC
  Administered 2013-01-13: 2000 mg via ORAL
  Filled 2013-01-13: qty 4

## 2013-01-13 MED ORDER — CEPHALEXIN 500 MG PO CAPS
500.0000 mg | ORAL_CAPSULE | Freq: Once | ORAL | Status: AC
  Administered 2013-01-13: 500 mg via ORAL
  Filled 2013-01-13: qty 1

## 2013-01-13 NOTE — ED Notes (Signed)
Pt reports that she woke up about 2 am and noticed some tingling in left cheek, nausea, vomiting and some pain in left side of chest.  Reports symptoms have improved slightly throughout day.  No distress noted at this time.

## 2013-01-13 NOTE — ED Provider Notes (Signed)
History     CSN: [id number]  Arrival date & time 01/13/13  1914   First MD Initiated Contact with Patient 01/13/13 1944      Chief Complaint  Patient presents with  . Nausea  . Chest Pain  . Numbness     HPI Pt was seen at 1945.   Per pt, c/o gradual onset and persistence of constant left sided facial "tingling" and "numbness" that began approx 0200 this morning.  Has been associated with constant left sided chest "pain," "a funny taste in my mouth," generalized body aches, as well as several episodes of N/V.  Denies palpitations, no SOB/cough, no diarrhea, no black or blood in stools or emesis, no abd pain, no back pain, no visual changes, no dysphagia, no tingling/numbness in extremities, no focal motor weakness, no ataxia, no fevers.     Past Medical History  Diagnosis Date  . COPD (chronic obstructive pulmonary disease)     Past Surgical History  Procedure Date  . Abdominal hysterectomy   . Cholecystectomy      History  Substance Use Topics  . Smoking status: Current Every Day Smoker -- 1.0 packs/day  . Smokeless tobacco: Not on file  . Alcohol Use: No    Review of Systems ROS: Statement: All systems negative except as marked or noted in the HPI; Constitutional: Negative for fever and chills. ; ; Eyes: Negative for eye pain, redness and discharge. ; ; ENMT: Negative for ear pain, hoarseness, nasal congestion, sinus pressure and sore throat. ; ; Cardiovascular: +CP. Negative for palpitations, diaphoresis, dyspnea and peripheral edema. ; ; Respiratory: Negative for cough, wheezing and stridor. ; ; Gastrointestinal: +N/V. Negative for diarrhea, abdominal pain, blood in stool, hematemesis, jaundice and rectal bleeding. . ; ; Genitourinary: Negative for dysuria, flank pain and hematuria. ; ; Musculoskeletal: Negative for back pain and neck pain. Negative for swelling and trauma.; ; Skin: Negative for pruritus, rash, abrasions, blisters, bruising and skin lesion.; ; Neuro:  +paresthesias. Negative for headache, lightheadedness and neck stiffness. Negative for weakness, altered level of consciousness , altered mental status, extremity weakness, involuntary movement, seizure and syncope.       Allergies  Review of patient's allergies indicates no known allergies.  Home Medications   Current Outpatient Rx  Name  Route  Sig  Dispense  Refill  . ALBUTEROL SULFATE HFA 108 (90 BASE) MCG/ACT IN AERS   Inhalation   Inhale 1-2 puffs into the lungs every 6 (six) hours as needed for wheezing.   1 Inhaler   0     BP 132/85  Pulse 72  Temp 97.6 F (36.4 C) (Oral)  Resp 20  Ht 5' (1.524 m)  Wt 120 lb (54.432 kg)  BMI 23.44 kg/m2  SpO2 98%  Physical Exam 1950: Physical examination:  Nursing notes reviewed; Vital signs and O2 SAT reviewed;  Constitutional: Well developed, Well nourished, Well hydrated, In no acute distress; Head:  Normocephalic, atraumatic; Eyes: EOMI, PERRL, No scleral icterus; ENMT: Mouth and pharynx normal, Mucous membranes moist; Neck: Supple, Full range of motion, No lymphadenopathy; Cardiovascular: Regular rate and rhythm, No gallop; Respiratory: Breath sounds clear & equal bilaterally, No rales, rhonchi, wheezes.  Speaking full sentences with ease, Normal respiratory effort/excursion; Chest: Nontender, Movement normal; Abdomen: Soft, Nontender, Nondistended, Normal bowel sounds; Genitourinary: No CVA tenderness; Extremities: Pulses normal, No tenderness, No edema, No calf edema or asymmetry.; Neuro: AA&Ox3, Major CN grossly intact.  Strength 5/5 equal bilat UE's and LE's.  DTR 2/4 equal  bilat UE's and LE's.  +subjective decreased sensation entire left face, including forehead, otherwise no gross sensory deficits.  Normal cerebellar testing bilat UE's (finger-nose) and LE's (heel-shin). Speech clear.  No facial droop.  No nystagmus.;; Skin: Color normal, Warm, Dry.   ED Course  Procedures    MDM  MDM Reviewed: nursing note, previous chart  and vitals Reviewed previous: ECG Interpretation: ECG, labs, x-ray and MRI    Date: 01/13/2013  Rate: 62  Rhythm: normal sinus rhythm  QRS Axis: normal  Intervals: normal  ST/T Wave abnormalities: normal  Conduction Disutrbances:none  Narrative Interpretation:   Old EKG Reviewed: unchanged; no significant changes from previous EKG dated 12/06/2012.   Dg Chest 2 View 01/13/2013  *RADIOLOGY REPORT*  Clinical Data: Cough, chest pain  CHEST - 2 VIEW  Comparison: 12/06/2012  Findings: Lungs are essentially clear.  No focal consolidation. No pleural effusion or pneumothorax.  The heart is top normal in size.  Visualized osseous structures are within normal limits.  Cholecystectomy clips.  IMPRESSION: No evidence of acute cardiopulmonary disease.   Original Report Authenticated By: Charline Bills, M.D.    Mr Brain Wo Contrast 01/13/2013  *RADIOLOGY REPORT*  Clinical Data: Left-sided facial numbness, headache.  Tobacco use.  MRI HEAD WITHOUT CONTRAST  Technique:  Multiplanar, multiecho pulse sequences of the brain and surrounding structures were obtained according to standard protocol without intravenous contrast.  Comparison: CT head 10/31/2010.  Findings: There is no evidence for acute infarction, intracranial hemorrhage, mass lesion, hydrocephalus, or extra-axial fluid. There is no evidence for atrophy or large vessel infarct.  Mild nonspecific subcortical and periventricular FLAIR and T2 white matter signal abnormality is noted.  Considerations include chronic microvascular ischemic change from diabetes or hypertension, chronic infection, vasculitis,  complicated migraine, or a demyelinating process such as multiple sclerosis.  Slight periventricular predominance could favor a demyelinating condition.  Unremarkable pituitary and cerebellar tonsils. The flow voids are maintained in the major intracranial vessels.  Upper cervical region unremarkable.  No acute osseous findings.  Paranasal sinuses show  mild mucosal thickening in the ethmoids but are otherwise clear.  There is no mastoid fluid.  The orbits appear unremarkable.  IMPRESSION: No acute intracranial abnormality.  No evidence for acute stroke or hemorrhage.  Nonspecific subcortical and periventricular white matter signal abnormality.  Slight periventricular predominance could favor a demyelinating process.  Correlate clinically.   Original Report Authenticated By: Davonna Belling, M.D.      Results for orders placed during the hospital encounter of 01/13/13  TROPONIN I      Component Value Range   Troponin I <0.30  <0.30 ng/mL  CBC WITH DIFFERENTIAL      Component Value Range   WBC 6.9  4.0 - 10.5 K/uL   RBC 5.07  3.87 - 5.11 MIL/uL   Hemoglobin 14.3  12.0 - 15.0 g/dL   HCT 03.4  74.2 - 59.5 %   MCV 86.4  78.0 - 100.0 fL   MCH 28.2  26.0 - 34.0 pg   MCHC 32.6  30.0 - 36.0 g/dL   RDW 63.8  75.6 - 43.3 %   Platelets 227  150 - 400 K/uL   Neutrophils Relative 35 (*) 43 - 77 %   Neutro Abs 2.4  1.7 - 7.7 K/uL   Lymphocytes Relative 55 (*) 12 - 46 %   Lymphs Abs 3.8  0.7 - 4.0 K/uL   Monocytes Relative 7  3 - 12 %   Monocytes Absolute 0.5  0.1 -  1.0 K/uL   Eosinophils Relative 2  0 - 5 %   Eosinophils Absolute 0.1  0.0 - 0.7 K/uL   Basophils Relative 1  0 - 1 %   Basophils Absolute 0.0  0.0 - 0.1 K/uL  URINALYSIS, ROUTINE W REFLEX MICROSCOPIC      Component Value Range   Color, Urine YELLOW  YELLOW   APPearance CLEAR  CLEAR   Specific Gravity, Urine 1.025  1.005 - 1.030   pH 6.0  5.0 - 8.0   Glucose, UA NEGATIVE  NEGATIVE mg/dL   Hgb urine dipstick NEGATIVE  NEGATIVE   Bilirubin Urine NEGATIVE  NEGATIVE   Ketones, ur NEGATIVE  NEGATIVE mg/dL   Protein, ur NEGATIVE  NEGATIVE mg/dL   Urobilinogen, UA 0.2  0.0 - 1.0 mg/dL   Nitrite NEGATIVE  NEGATIVE   Leukocytes, UA SMALL (*) NEGATIVE  COMPREHENSIVE METABOLIC PANEL      Component Value Range   Sodium 140  135 - 145 mEq/L   Potassium 3.7  3.5 - 5.1 mEq/L   Chloride 104   96 - 112 mEq/L   CO2 25  19 - 32 mEq/L   Glucose, Bld 96  70 - 99 mg/dL   BUN 11  6 - 23 mg/dL   Creatinine, Ser 1.61  0.50 - 1.10 mg/dL   Calcium 9.3  8.4 - 09.6 mg/dL   Total Protein 7.0  6.0 - 8.3 g/dL   Albumin 4.0  3.5 - 5.2 g/dL   AST 12  0 - 37 U/L   ALT 9  0 - 35 U/L   Alkaline Phosphatase 90  39 - 117 U/L   Total Bilirubin 0.2 (*) 0.3 - 1.2 mg/dL   GFR calc non Af Amer 68 (*) >90 mL/min   GFR calc Af Amer 78 (*) >90 mL/min  LIPASE, BLOOD      Component Value Range   Lipase 47  11 - 59 U/L  URINE MICROSCOPIC-ADD ON      Component Value Range   Squamous Epithelial / LPF FEW (*) RARE   WBC, UA 11-20  <3 WBC/hpf   Bacteria, UA FEW (*) RARE   Urine-Other TRICHOMONAS PRESENT       2200:  Pt not orthostatic.  Has tol PO well without N/V or gagging/choking.  No stooling while in the ED.  Will tx for trich in urine while in the ED.  +UTI, UC pending.  Will give 1st dose of abx while in the ED.  Doubt ACS as cause for CP given constant symptoms for 20 hours with normal troponin and unchanged EKG from previous.  Neuro exam unchanged.  No acute stroke on MRI, though questions demyelinating process.  T/C to Neuro Dr. Roseanne Reno, case discussed, including:  HPI, pertinent PM/SHx, VS/PE, dx testing, ED course and treatment:  pt may develop a Bell's palsy over the next few days, do not start steroids now if pt does not have any weakness, f/u Neuro MD as outpt.  Pt has climbed off the stretcher on her own, gotten herself dressed and wants to go home now.  Dx and testing, as well as d/w Neuro MD, d/w pt.  Questions answered.  Verb understanding, agreeable to d/c home with outpt f/u.            Laray Anger, DO 01/15/13 2356

## 2013-01-13 NOTE — ED Notes (Signed)
Pt discharged. Pt stable at time of discharge. Medications reviewed pt has no questions regarding discharge at this time. Pt voiced understanding of discharge instructions.  

## 2013-01-13 NOTE — ED Notes (Signed)
During orthostatic vitals the patient said she was dizzy when standing. When sitting or laying down she said she was fine.

## 2013-01-14 LAB — URINE CULTURE

## 2013-02-01 ENCOUNTER — Emergency Department (HOSPITAL_COMMUNITY)
Admission: EM | Admit: 2013-02-01 | Discharge: 2013-02-01 | Disposition: A | Discharge status: Home / Self Care | Payer: Medicare Other | Attending: Emergency Medicine | Admitting: Emergency Medicine

## 2013-02-01 ENCOUNTER — Encounter (HOSPITAL_COMMUNITY): Payer: Self-pay | Admitting: Emergency Medicine

## 2013-02-01 ENCOUNTER — Emergency Department (HOSPITAL_COMMUNITY): Payer: Medicare Other

## 2013-02-01 DIAGNOSIS — M7989 Other specified soft tissue disorders: Secondary | ICD-10-CM | POA: Insufficient documentation

## 2013-02-01 DIAGNOSIS — J449 Chronic obstructive pulmonary disease, unspecified: Secondary | ICD-10-CM | POA: Insufficient documentation

## 2013-02-01 DIAGNOSIS — R5381 Other malaise: Secondary | ICD-10-CM | POA: Insufficient documentation

## 2013-02-01 DIAGNOSIS — Z79899 Other long term (current) drug therapy: Secondary | ICD-10-CM | POA: Diagnosis not present

## 2013-02-01 DIAGNOSIS — F17200 Nicotine dependence, unspecified, uncomplicated: Secondary | ICD-10-CM | POA: Diagnosis not present

## 2013-02-01 DIAGNOSIS — M79605 Pain in left leg: Secondary | ICD-10-CM

## 2013-02-01 DIAGNOSIS — J4489 Other specified chronic obstructive pulmonary disease: Secondary | ICD-10-CM | POA: Insufficient documentation

## 2013-02-01 DIAGNOSIS — M79609 Pain in unspecified limb: Secondary | ICD-10-CM | POA: Diagnosis not present

## 2013-02-01 MED ORDER — TRAMADOL HCL 50 MG PO TABS: 100.0000 mg | ORAL_TABLET | Freq: Four times a day (QID) | ORAL | Status: DC | PRN

## 2013-02-01 MED ORDER — CYCLOBENZAPRINE HCL 5 MG PO TABS: 5.0000 mg | ORAL_TABLET | Freq: Three times a day (TID) | ORAL | Status: DC | PRN

## 2013-02-01 NOTE — ED Provider Notes (Signed)
History     This chart was scribed for Ward Givens, MD, MD by Smitty Pluck, ED Scribe. The patient was seen in room APA12/APA12 and the patient's care was started at 9:47 AM.   CSN: [id number]  Arrival date & time 02/01/13  0844      Chief Complaint  Patient presents with  . Leg Pain    The history is provided by the patient. No language interpreter was used.   [place]ia Washington is a 57 y.o. female with hx of COPD who presents to the Emergency Department complaining of constant, moderate left leg weakness onset 2 weeks ago. Pt reports that her left leg "gives out" when she walks. She reports intermittent swelling in her left leg after she walks for awhile. She states she has pain in left calf when she walks. She denies recent travel, injury to left leg, chest pain, SOB, fever and any other pain. She denies being bed bound. She has not had this before.    She reports smoking 0.5/packs cigarettes. She denies drinking alcohol.   She states she has referred to Dr. Gerilyn Pilgrim but has not followed up with him.  Pt reports that she does not PCP anymore but it was Dr. Marvel Plan  Past Medical History  Diagnosis Date  . COPD (chronic obstructive pulmonary disease)     Past Surgical History  Procedure Laterality Date  . Abdominal hysterectomy    . Cholecystectomy      No family history on file.  History  Substance Use Topics  . Smoking status: Current Every Day Smoker -- 1.00 packs/day  . Smokeless tobacco: Not on file  . Alcohol Use: No  widowed, on SSI from being widowed Lives with GS  OB History   Grav Para Term Preterm Abortions TAB SAB Ect Mult Living                  Review of Systems  Constitutional: Negative for fever and chills.  Respiratory: Negative for cough and shortness of breath.   Cardiovascular: Positive for leg swelling. Negative for chest pain.  All other systems reviewed and are negative.    Allergies  Review of patient's allergies indicates no known  allergies.  Home Medications   Current Outpatient Rx  Name  Route  Sig  Dispense  Refill  . albuterol (PROVENTIL HFA;VENTOLIN HFA) 108 (90 BASE) MCG/ACT inhaler   Inhalation   Inhale 1-2 puffs into the lungs every 6 (six) hours as needed for wheezing.   1 Inhaler   0   . cephALEXin (KEFLEX) 500 MG capsule   Oral   Take 1 capsule (500 mg total) by mouth 4 (four) times daily.   40 capsule   0   . traMADol (ULTRAM) 50 MG tablet   Oral   Take 1 tablet (50 mg total) by mouth every 6 (six) hours as needed for pain.   15 tablet   0     BP 128/83  Pulse 87  Temp(Src) 98.4 F (36.9 C)  Resp 20  Ht 5\' 4"  (1.626 m)  Wt 120 lb (54.432 kg)  BMI 20.59 kg/m2  SpO2 97%  Vital signs normal    Physical Exam  Nursing note and vitals reviewed. Constitutional: She is oriented to person, place, and time. She appears well-developed and well-nourished.  Non-toxic appearance. She does not appear ill. No distress.  HENT:  Head: Normocephalic and atraumatic.  Right Ear: External ear normal.  Left Ear: External ear normal.  Nose: Nose normal. No mucosal edema or rhinorrhea.  Mouth/Throat: Oropharynx is clear and moist and mucous membranes are normal. No dental abscesses or edematous.  One tooth present otherwise edentulous  Eyes: Conjunctivae and EOM are normal. Pupils are equal, round, and reactive to light.  Neck: Normal range of motion and full passive range of motion without pain. Neck supple.  Cardiovascular: Normal rate, regular rhythm and normal heart sounds.  Exam reveals no gallop and no friction rub.   No murmur heard. Pulmonary/Chest: Effort normal and breath sounds normal. No respiratory distress. She has no wheezes. She has no rhonchi. She has no rales. She exhibits no tenderness and no crepitus.  Abdominal: Soft. Normal appearance and bowel sounds are normal. She exhibits no distension. There is no tenderness. There is no rebound and no guarding.  Musculoskeletal: Normal  range of motion. She exhibits no edema.  No joint effusion of left knee No lesions on left leg Left knee is non-tender Tenderness over the  [place], no step-offs, deformity,crepitance No swelling in left leg noted. Tenderness in left calf without chords  Neurological: She is alert and oriented to person, place, and time. She has normal strength. No cranial nerve deficit.  Skin: Skin is warm, dry and intact. No rash noted. No erythema. No pallor.  Psychiatric: She has a normal mood and affect. Her speech is normal and behavior is normal. Her mood appears not anxious.    ED Course  Procedures (including critical care time) DIAGNOSTIC STUDIES: Oxygen Saturation is 97% on room air, normal by my interpretation.    COORDINATION OF CARE: 9:52 AM Discussed ED treatment with pt and pt agrees.   Pt placed in a knee immobilizer   Dg Tibia/fibula Left  02/01/2013  *RADIOLOGY REPORT*  Clinical Data: Left leg pain  LEFT TIBIA AND FIBULA - 2 VIEW  Comparison: None.  Findings: No acute fracture and no dislocation.  Mild degenerative changes in the knee.  Bony density adjacent to the lateral femoral condyle has a chronic appearance.  IMPRESSION: No acute bony pathology.   Original Report Authenticated By: Jolaine Click, M.D.    US Venous Img Lower Unilateral Left  02/01/2013  *RADIOLOGY REPORT*  Clinical Data: Left lower extremity pain.  LEFT LOWER EXTREMITY VENOUS DUPLEX ULTRASOUND  Technique:  Gray-scale sonography with graded compression, as well as color Doppler and duplex ultrasound were performed to evaluate the deep venous system of the lower extremity from the level of the common femoral vein through the popliteal and proximal calf veins. Spectral Doppler was utilized to evaluate flow at rest and with distal augmentation maneuvers.  Comparison:  None.  Findings:  Normal compressibility of the common femoral, superficial femoral, and popliteal veins is demonstrated, as well as the visualized  proximal calf veins.  No filling defects to suggest DVT on grayscale or color Doppler imaging.  Doppler waveforms show normal direction of venous flow, normal respiratory phasicity and response to augmentation.  IMPRESSION: No evidence of left lower extremity deep vein thrombosis.   Original Report Authenticated By: Trudie Reed, M.D.      1. Lower extremity pain, left     New Prescriptions   CYCLOBENZAPRINE (FLEXERIL) 5 MG TABLET    Take 1 tablet (5 mg total) by mouth 3 (three) times daily as needed for muscle spasms.   TRAMADOL (ULTRAM) 50 MG TABLET    Take 2 tablets (100 mg total) by mouth every 6 (six) hours as needed for pain.    Plan discharge  Devoria Albe, MD, FACEP   MDM   I personally performed the services described in this documentation, which was scribed in my presence. The recorded information has been reviewed and considered.  Devoria Albe, MD, Armando Gang    Ward Givens, MD 02/01/13 [phone]

## 2013-02-01 NOTE — ED Notes (Signed)
Pt c/o intermittent lle pain and "tightness" x 2 weeks.

## 2013-02-26 ENCOUNTER — Telehealth: Payer: Self-pay | Admitting: Orthopedic Surgery

## 2013-02-26 NOTE — Telephone Encounter (Signed)
Patient called to request appointment following Emergency Room visit, [facility] for leg pain; states was referred to Dr. Romeo Apple.  Reviewed her insurance with her, and due to secondary [place] Medicaid, explained that referral is needed from primary care physician.  She states she may be changing primary care doctor.  Will call back after she checks with doctor's office regarding referral.  Her ph# is [phone].

## 2013-02-28 DIAGNOSIS — IMO0002 Reserved for concepts with insufficient information to code with codable children: Secondary | ICD-10-CM | POA: Diagnosis not present

## 2013-03-05 ENCOUNTER — Emergency Department (HOSPITAL_COMMUNITY)
Admission: EM | Admit: 2013-03-05 | Discharge: 2013-03-05 | Disposition: A | Discharge status: Home / Self Care | Payer: Medicare Other | Attending: Emergency Medicine | Admitting: Emergency Medicine

## 2013-03-05 ENCOUNTER — Encounter (HOSPITAL_COMMUNITY): Payer: Self-pay

## 2013-03-05 DIAGNOSIS — R51 Headache: Secondary | ICD-10-CM | POA: Diagnosis not present

## 2013-03-05 DIAGNOSIS — J449 Chronic obstructive pulmonary disease, unspecified: Secondary | ICD-10-CM | POA: Diagnosis not present

## 2013-03-05 DIAGNOSIS — R350 Frequency of micturition: Secondary | ICD-10-CM | POA: Diagnosis not present

## 2013-03-05 DIAGNOSIS — Z79899 Other long term (current) drug therapy: Secondary | ICD-10-CM | POA: Insufficient documentation

## 2013-03-05 DIAGNOSIS — F17200 Nicotine dependence, unspecified, uncomplicated: Secondary | ICD-10-CM | POA: Insufficient documentation

## 2013-03-05 DIAGNOSIS — E860 Dehydration: Secondary | ICD-10-CM | POA: Diagnosis not present

## 2013-03-05 DIAGNOSIS — R1110 Vomiting, unspecified: Secondary | ICD-10-CM | POA: Insufficient documentation

## 2013-03-05 DIAGNOSIS — R631 Polydipsia: Secondary | ICD-10-CM | POA: Diagnosis not present

## 2013-03-05 DIAGNOSIS — R42 Dizziness and giddiness: Secondary | ICD-10-CM | POA: Diagnosis not present

## 2013-03-05 DIAGNOSIS — J4489 Other specified chronic obstructive pulmonary disease: Secondary | ICD-10-CM | POA: Insufficient documentation

## 2013-03-05 LAB — COMPREHENSIVE METABOLIC PANEL
AST: 19 U/L (ref 0–37)
Albumin: 4.1 g/dL (ref 3.5–5.2)
CO2: 26 mEq/L (ref 19–32)
Calcium: 9.6 mg/dL (ref 8.4–10.5)
Creatinine, Ser: 1.06 mg/dL (ref 0.50–1.10)
GFR calc non Af Amer: 58 mL/min — ABNORMAL LOW (ref >90)
Sodium: 135 mEq/L (ref 135–145)
Total Protein: 7.6 g/dL (ref 6.0–8.3)

## 2013-03-05 LAB — CBC WITH DIFFERENTIAL/PLATELET
Basophils Absolute: 0.1 10*3/uL (ref 0.0–0.1)
Basophils Relative: 1 % (ref 0–1)
Eosinophils Absolute: 0.1 10*3/uL (ref 0.0–0.7)
Eosinophils Relative: 1 % (ref 0–5)
HCT: 49.6 % — ABNORMAL HIGH (ref 36.0–46.0)
MCHC: 32.9 g/dL (ref 30.0–36.0)
MCV: 85.7 fL (ref 78.0–100.0)
Monocytes Absolute: 0.6 10*3/uL (ref 0.1–1.0)
Platelets: 243 10*3/uL (ref 150–400)
RDW: 13.9 % (ref 11.5–15.5)

## 2013-03-05 LAB — URINALYSIS, ROUTINE W REFLEX MICROSCOPIC
Hgb urine dipstick: NEGATIVE
Specific Gravity, Urine: 1.02 (ref 1.005–1.030)
Urobilinogen, UA: 0.2 mg/dL (ref 0.0–1.0)
pH: 6.5 (ref 5.0–8.0)

## 2013-03-05 MED ORDER — MECLIZINE HCL 12.5 MG PO TABS
25.0000 mg | ORAL_TABLET | Freq: Once | ORAL | Status: AC
  Administered 2013-03-05: 25 mg via ORAL
  Filled 2013-03-05: qty 2

## 2013-03-05 MED ORDER — MECLIZINE HCL 25 MG PO TABS: 25.0000 mg | ORAL_TABLET | Freq: Four times a day (QID) | ORAL | Status: DC | PRN

## 2013-03-05 NOTE — ED Provider Notes (Signed)
History  This chart was scribed for Ward Givens, MD by Bennett Scrape, ED Scribe. This patient was seen in room APA01/APA01 and the patient's care was started at 2:56 PM.  CSN: [id number]  Arrival date & time 03/05/13  1419   First MD Initiated Contact with Patient 03/05/13 1456      Chief Complaint  Patient presents with  . Dizziness     The history is provided by the patient. No language interpreter was used.    Virginia Washington is a 57 y.o. female who presents to the Emergency Department complaining of 3 days of intermittent episodes of lightheadedness lasting 10 minutes with one associated frontal HA described as sharp that lasted 30 minutes yesterday, 3 episodes of emesis, urinary frequency and polydipsia. She reports that the last episode of emesis was this morning and she denies being nauseated currently. She reports that the lightheadedness is worse with sitting up and denies having a spinning sensation. She denies similarities to prior HAs stating that it was shorter lasting but in the same location. She reports possible weight loss for the past week but is very vague. She states that she has been drinking Sprite and orange juice since the onset with no improvement. She denies having prior episodes of similar symptoms. She denies having a prior diagnosis of DM and denies being on medication currently. She states that she was told that her BP was high when she was seen for her leg yesterday but was vague in describing the situation. She denies abdominal pain, CP, SOB and other urinary symptoms as associated symptoms. She has a h/o COPD but is a current everyday smoker but denies alcohol use.   PCP was Dr. Milford Cage but he "left town". She states that she called social services to get another medicaid card [facility]  Past Medical History  Diagnosis Date  . COPD (chronic obstructive pulmonary disease)     Past Surgical History  Procedure Laterality Date  .  Abdominal hysterectomy    . Cholecystectomy      No family history on file.  History  Substance Use Topics  . Smoking status: Current Every Day Smoker -- 1.00 packs/day  . Smokeless tobacco: Not on file  . Alcohol Use: No  Pt is getting husband's disability Lives with grandson  No OB history provided.  Review of Systems  Constitutional: Positive for unexpected weight change. Negative for fever and chills.  HENT: Negative for sore throat.   Respiratory: Negative for shortness of breath.   Cardiovascular: Negative for chest pain.  Gastrointestinal: Positive for vomiting. Negative for nausea, abdominal pain and diarrhea.  Endocrine: Positive for polydipsia.  Genitourinary: Positive for frequency. Negative for dysuria.  Neurological: Positive for light-headedness and headaches. Negative for dizziness.  All other systems reviewed and are negative.    Allergies  Review of patient's allergies indicates no known allergies.  Home Medications   Current Outpatient Rx  Name  Route  Sig  Dispense  Refill  . albuterol (PROVENTIL HFA;VENTOLIN HFA) 108 (90 BASE) MCG/ACT inhaler   Inhalation   Inhale 1-2 puffs into the lungs every 6 (six) hours as needed for wheezing.   1 Inhaler   0   . cephALEXin (KEFLEX) 500 MG capsule   Oral   Take 1 capsule (500 mg total) by mouth 4 (four) times daily.   40 capsule   0   . cyclobenzaprine (FLEXERIL) 5 MG tablet   Oral   Take 1 tablet (  5 mg total) by mouth 3 (three) times daily as needed for muscle spasms.   30 tablet   0   . traMADol (ULTRAM) 50 MG tablet   Oral   Take 1 tablet (50 mg total) by mouth every 6 (six) hours as needed for pain.   15 tablet   0   . traMADol (ULTRAM) 50 MG tablet   Oral   Take 2 tablets (100 mg total) by mouth every 6 (six) hours as needed for pain.   16 tablet   0     Triage Vitals: BP 113/72  Pulse 98  Temp(Src) 97.9 F (36.6 C) (Oral)  Resp 20  Ht 5\' 2"  (1.575 m)  Wt 130 lb (58.968 kg)  BMI  23.77 kg/m2  SpO2 99%  Vital signs normal    Orthostatic vital signs are normal  Physical Exam  Nursing note and vitals reviewed. Constitutional: She is oriented to person, place, and time. She appears well-developed and well-nourished.  Non-toxic appearance. She does not appear ill. No distress.  HENT:  Head: Normocephalic and atraumatic.  Right Ear: External ear normal.  Left Ear: External ear normal.  Nose: Nose normal. No mucosal edema or rhinorrhea.  Mouth/Throat: Oropharynx is clear and moist. Mucous membranes are dry. No dental abscesses or edematous.  Eyes: Conjunctivae and EOM are normal. Pupils are equal, round, and reactive to light.  Neck: Normal range of motion and full passive range of motion without pain. Neck supple.  Cardiovascular: Normal rate, regular rhythm and normal heart sounds.  Exam reveals no gallop and no friction rub.   No murmur heard. Pulmonary/Chest: Effort normal and breath sounds normal. No respiratory distress. She has no wheezes. She has no rhonchi. She has no rales. She exhibits no tenderness and no crepitus.  Abdominal: Soft. Normal appearance and bowel sounds are normal. She exhibits no distension. There is no tenderness. There is no rebound and no guarding.  Musculoskeletal: Normal range of motion. She exhibits no edema and no tenderness.  Moves all extremities well.   Neurological: She is alert and oriented to person, place, and time. She has normal strength. No cranial nerve deficit.  Skin: Skin is warm, dry and intact. No rash noted. No erythema. No pallor.  Psychiatric: She has a normal mood and affect. Her speech is normal and behavior is normal. Her mood appears not anxious.    ED Course  Procedures (including critical care time)  Medications  meclizine (ANTIVERT) tablet 25 mg (25 mg Oral Given 03/05/13 1548)   DIAGNOSTIC STUDIES: Oxygen Saturation is 99% on room air, normal by my interpretation.    COORDINATION OF CARE: 3:36  PM-Discussed treatment plan which includes meclizine, CBC panel, UA and CMP with pt at bedside and pt agreed to plan.   4:48 PM-Informed pt of lab work results showing dehydration. Discussed discharge plan which includes drinking Gatorade and a prescription for meclizine with pt and pt agreed to plan. Also advised pt to follow up and pt agreed. Pt is less dizzy on sitting up after medications.   Results for orders placed during the hospital encounter of 03/05/13  CBC WITH DIFFERENTIAL      Result Value Range   WBC 7.8  4.0 - 10.5 K/uL   RBC 5.79 (*) 3.87 - 5.11 MIL/uL   Hemoglobin 16.3 (*) 12.0 - 15.0 g/dL   HCT 98.1 (*) 19.1 - 47.8 %   MCV 85.7  78.0 - 100.0 fL   MCH 28.2  26.0 -  34.0 pg   MCHC 32.9  30.0 - 36.0 g/dL   RDW 11.9  14.7 - 82.9 %   Platelets 243  150 - 400 K/uL   Neutrophils Relative 38 (*) 43 - 77 %   Neutro Abs 3.0  1.7 - 7.7 K/uL   Lymphocytes Relative 52 (*) 12 - 46 %   Lymphs Abs 4.1 (*) 0.7 - 4.0 K/uL   Monocytes Relative 8  3 - 12 %   Monocytes Absolute 0.6  0.1 - 1.0 K/uL   Eosinophils Relative 1  0 - 5 %   Eosinophils Absolute 0.1  0.0 - 0.7 K/uL   Basophils Relative 1  0 - 1 %   Basophils Absolute 0.1  0.0 - 0.1 K/uL  COMPREHENSIVE METABOLIC PANEL      Result Value Range   Sodium 135  135 - 145 mEq/L   Potassium 4.5  3.5 - 5.1 mEq/L   Chloride 98  96 - 112 mEq/L   CO2 26  19 - 32 mEq/L   Glucose, Bld 113 (*) 70 - 99 mg/dL   BUN 17  6 - 23 mg/dL   Creatinine, Ser 5.62  0.50 - 1.10 mg/dL   Calcium 9.6  8.4 - 13.0 mg/dL   Total Protein 7.6  6.0 - 8.3 g/dL   Albumin 4.1  3.5 - 5.2 g/dL   AST 19  0 - 37 U/L   ALT 21  0 - 35 U/L   Alkaline Phosphatase 109  39 - 117 U/L   Total Bilirubin 0.2 (*) 0.3 - 1.2 mg/dL   GFR calc non Af Amer 58 (*) >90 mL/min   GFR calc Af Amer 68 (*) >90 mL/min  URINALYSIS, ROUTINE W REFLEX MICROSCOPIC      Result Value Range   Color, Urine YELLOW  YELLOW   APPearance HAZY (*) CLEAR   Specific Gravity, Urine 1.020  1.005 -  1.030   pH 6.5  5.0 - 8.0   Glucose, UA NEGATIVE  NEGATIVE mg/dL   Hgb urine dipstick NEGATIVE  NEGATIVE   Bilirubin Urine NEGATIVE  NEGATIVE   Ketones, ur TRACE (*) NEGATIVE mg/dL   Protein, ur NEGATIVE  NEGATIVE mg/dL   Urobilinogen, UA 0.2  0.0 - 1.0 mg/dL   Nitrite NEGATIVE  NEGATIVE   Leukocytes, UA NEGATIVE  NEGATIVE   Laboratory interpretation all normal except concentrated hemoglobin consistent with dehydration    1. Dizziness   2. Dehydration    Discharge Medication List as of 03/05/2013  5:11 PM    START taking these medications   Details  meclizine (ANTIVERT) 25 MG tablet Take 1 tablet (25 mg total) by mouth 4 (four) times daily as needed., Starting 03/05/2013, Until Discontinued, Print        Plan discharge  Devoria Albe, MD, FACEP    MDM    I personally performed the services described in this documentation, which was scribed in my presence. The recorded information has been reviewed and considered.  Devoria Albe, MD, Armando Gang    Ward Givens, MD 03/05/13 [phone]

## 2013-03-05 NOTE — ED Notes (Signed)
Pt reports dizziness since yesterday.  Pt denies any change in meds.  Pt denies any other symptoms.

## 2013-04-16 ENCOUNTER — Emergency Department (HOSPITAL_COMMUNITY)
Admission: EM | Admit: 2013-04-16 | Discharge: 2013-04-17 | Disposition: A | Discharge status: Home / Self Care | Payer: Medicare Other | Attending: Emergency Medicine | Admitting: Emergency Medicine

## 2013-04-16 ENCOUNTER — Encounter (HOSPITAL_COMMUNITY): Payer: Self-pay | Admitting: Emergency Medicine

## 2013-04-16 DIAGNOSIS — F17200 Nicotine dependence, unspecified, uncomplicated: Secondary | ICD-10-CM | POA: Insufficient documentation

## 2013-04-16 DIAGNOSIS — Z79899 Other long term (current) drug therapy: Secondary | ICD-10-CM | POA: Diagnosis not present

## 2013-04-16 DIAGNOSIS — J4489 Other specified chronic obstructive pulmonary disease: Secondary | ICD-10-CM | POA: Insufficient documentation

## 2013-04-16 DIAGNOSIS — H5789 Other specified disorders of eye and adnexa: Secondary | ICD-10-CM | POA: Insufficient documentation

## 2013-04-16 DIAGNOSIS — J449 Chronic obstructive pulmonary disease, unspecified: Secondary | ICD-10-CM | POA: Diagnosis not present

## 2013-04-16 DIAGNOSIS — H579 Unspecified disorder of eye and adnexa: Secondary | ICD-10-CM

## 2013-04-16 MED ORDER — TETRACAINE HCL 0.5 % OP SOLN
2.0000 [drp] | Freq: Once | OPHTHALMIC | Status: AC
  Administered 2013-04-16: 2 [drp] via OPHTHALMIC
  Filled 2013-04-16: qty 2

## 2013-04-16 MED ORDER — FLUORESCEIN SODIUM 1 MG OP STRP
ORAL_STRIP | OPHTHALMIC | Status: AC
  Administered 2013-04-16
  Filled 2013-04-16: qty 1

## 2013-04-16 NOTE — ED Provider Notes (Signed)
History     CSN: [id number]  Arrival date & time 04/16/13  2159   First MD Initiated Contact with Patient 04/16/13 2330      Chief Complaint  Patient presents with  . Eye Pain    (Consider location/radiation/quality/duration/timing/severity/associated sxs/prior treatment) HPI Virginia Washington is a 57 y.o. female who presents to the ED with right eye irritation. The irritation started 12 hours or longer ago. She has had this problem before and "they" told her she had a flap of skin on her eye. The area went away without any treatment. The history was provided by the patient.  Past Medical History  Diagnosis Date  . COPD (chronic obstructive pulmonary disease)     Past Surgical History  Procedure Laterality Date  . Abdominal hysterectomy    . Cholecystectomy      History reviewed. No pertinent family history.  History  Substance Use Topics  . Smoking status: Current Every Day Smoker -- 1.00 packs/day  . Smokeless tobacco: Not on file  . Alcohol Use: No    OB History   Grav Para Term Preterm Abortions TAB SAB Ect Mult Living                  Review of Systems  Constitutional: Negative for fever and chills.  HENT: Negative for neck pain.   Eyes: Negative for photophobia, discharge and visual disturbance. Eye pain: irritation.  Psychiatric/Behavioral: The patient is not nervous/anxious.     Allergies  Review of patient's allergies indicates no known allergies.  Home Medications   Current Outpatient Rx  Name  Route  Sig  Dispense  Refill  . albuterol (PROVENTIL HFA;VENTOLIN HFA) 108 (90 BASE) MCG/ACT inhaler   Inhalation   Inhale 1-2 puffs into the lungs every 6 (six) hours as needed for wheezing.   1 Inhaler   0     BP 112/74  Pulse 85  Temp(Src) 97.6 F (36.4 C) (Oral)  Resp 20  Ht 5\' 2"  (1.575 m)  Wt 140 lb (63.504 kg)  BMI 25.6 kg/m2  SpO2 99%  Physical Exam  Nursing note and vitals reviewed. Constitutional: She is oriented to person, place,  and time. No distress.  HENT:  Head: Normocephalic.  Eyes: EOM and lids are normal. Pupils are equal, round, and reactive to light. No foreign bodies found. Right eye exhibits no exudate. No foreign body present in the right eye. No foreign body present in the left eye. Right conjunctiva is injected. Right eye exhibits normal extraocular motion.    There is a bleb area noted right eye in the pityroid area.  Eye anesthestized with tetracaine opth. Stained, no uptake noted.   Neck: Neck supple.  Cardiovascular: Normal rate.   Pulmonary/Chest: Effort normal.  Musculoskeletal: Normal range of motion.  Neurological: She is alert and oriented to person, place, and time. No cranial nerve deficit.  Skin: Skin is warm and dry.  Psychiatric: She has a normal mood and affect.    ED Course  Procedures (including critical care time) I discussed this case with Dr. Colon Branch. Will have patient call Dr. Lita Mains tomorrow for follow up evaluation.  MDM  Assessment: 57 y.o. female with bleb like area to the right eye    Conjunctival irritation  Plan:  Pain management, follow up with Opthalmology tomorrow     Discussed with the patient the importance of see Dr. Lita Mains for follow up and she voices understanding. All questioned fully answered.     Latandra Loureiro M  Damian Leavell, NP 04/17/13 1610

## 2013-04-16 NOTE — ED Notes (Signed)
Pt c/o rt eye pain and irritation since this am.

## 2013-04-17 MED ORDER — HYDROCODONE-ACETAMINOPHEN 5-325 MG PO TABS: 1.0000 | ORAL_TABLET | ORAL | Status: DC | PRN

## 2013-04-17 NOTE — ED Provider Notes (Signed)
Medical screening examination/treatment/procedure(s) were performed by non-physician practitioner and as supervising physician I was immediately available for consultation/collaboration.  Makensie Mulhall S. Raymar Joiner, MD 04/17/13 0350 

## 2013-04-23 DIAGNOSIS — H11009 Unspecified pterygium of unspecified eye: Secondary | ICD-10-CM | POA: Diagnosis not present

## 2013-07-01 ENCOUNTER — Encounter (HOSPITAL_COMMUNITY): Payer: Self-pay | Admitting: *Deleted

## 2013-07-01 ENCOUNTER — Emergency Department (HOSPITAL_COMMUNITY)
Admission: EM | Admit: 2013-07-01 | Discharge: 2013-07-02 | Disposition: A | Discharge status: Home / Self Care | Payer: Medicare Other | Attending: Emergency Medicine | Admitting: Emergency Medicine

## 2013-07-01 DIAGNOSIS — J4489 Other specified chronic obstructive pulmonary disease: Secondary | ICD-10-CM | POA: Insufficient documentation

## 2013-07-01 DIAGNOSIS — M545 Low back pain, unspecified: Secondary | ICD-10-CM

## 2013-07-01 DIAGNOSIS — J449 Chronic obstructive pulmonary disease, unspecified: Secondary | ICD-10-CM | POA: Insufficient documentation

## 2013-07-01 DIAGNOSIS — R141 Gas pain: Secondary | ICD-10-CM | POA: Diagnosis not present

## 2013-07-01 DIAGNOSIS — F17200 Nicotine dependence, unspecified, uncomplicated: Secondary | ICD-10-CM | POA: Diagnosis not present

## 2013-07-01 DIAGNOSIS — Z79899 Other long term (current) drug therapy: Secondary | ICD-10-CM | POA: Diagnosis not present

## 2013-07-01 DIAGNOSIS — R109 Unspecified abdominal pain: Secondary | ICD-10-CM | POA: Diagnosis not present

## 2013-07-01 DIAGNOSIS — K2970 Gastritis, unspecified, without bleeding: Secondary | ICD-10-CM | POA: Diagnosis not present

## 2013-07-01 DIAGNOSIS — J40 Bronchitis, not specified as acute or chronic: Secondary | ICD-10-CM | POA: Diagnosis not present

## 2013-07-01 DIAGNOSIS — R142 Eructation: Secondary | ICD-10-CM | POA: Insufficient documentation

## 2013-07-01 DIAGNOSIS — K2900 Acute gastritis without bleeding: Secondary | ICD-10-CM | POA: Diagnosis not present

## 2013-07-01 NOTE — ED Notes (Signed)
Pt with lower abd pain and lower back pain x 2 days, denies N/V/D or burning on urination

## 2013-07-02 ENCOUNTER — Emergency Department (HOSPITAL_COMMUNITY): Payer: Medicare Other

## 2013-07-02 DIAGNOSIS — R109 Unspecified abdominal pain: Secondary | ICD-10-CM | POA: Diagnosis not present

## 2013-07-02 DIAGNOSIS — J40 Bronchitis, not specified as acute or chronic: Secondary | ICD-10-CM | POA: Diagnosis not present

## 2013-07-02 LAB — COMPREHENSIVE METABOLIC PANEL
ALT: 8 U/L (ref 0–35)
Alkaline Phosphatase: 100 U/L (ref 39–117)
CO2: 27 mEq/L (ref 19–32)
GFR calc Af Amer: 87 mL/min — ABNORMAL LOW (ref >90)
GFR calc non Af Amer: 75 mL/min — ABNORMAL LOW (ref >90)
Glucose, Bld: 110 mg/dL — ABNORMAL HIGH (ref 70–99)
Potassium: 3.7 mEq/L (ref 3.5–5.1)
Sodium: 139 mEq/L (ref 135–145)

## 2013-07-02 LAB — CBC WITH DIFFERENTIAL/PLATELET
Eosinophils Relative: 1 % (ref 0–5)
Hemoglobin: 13.6 g/dL (ref 12.0–15.0)
Lymphocytes Relative: 49 % — ABNORMAL HIGH (ref 12–46)
Lymphs Abs: 4.4 10*3/uL — ABNORMAL HIGH (ref 0.7–4.0)
MCV: 87.5 fL (ref 78.0–100.0)
Neutrophils Relative %: 42 % — ABNORMAL LOW (ref 43–77)
Platelets: 214 10*3/uL (ref 150–400)
RBC: 4.73 MIL/uL (ref 3.87–5.11)
WBC: 8.9 10*3/uL (ref 4.0–10.5)

## 2013-07-02 LAB — URINALYSIS, ROUTINE W REFLEX MICROSCOPIC
Bilirubin Urine: NEGATIVE
Hgb urine dipstick: NEGATIVE
Protein, ur: NEGATIVE mg/dL
Urobilinogen, UA: 0.2 mg/dL (ref 0.0–1.0)

## 2013-07-02 MED ORDER — MORPHINE SULFATE 4 MG/ML IJ SOLN
INTRAMUSCULAR | Status: AC
  Administered 2013-07-02: 4 mg via INTRAVENOUS
  Filled 2013-07-02: qty 1

## 2013-07-02 MED ORDER — MORPHINE SULFATE 4 MG/ML IJ SOLN
4.0000 mg | Freq: Once | INTRAMUSCULAR | Status: AC
  Administered 2013-07-02: 4 mg via INTRAVENOUS

## 2013-07-02 MED ORDER — HYDROCODONE-ACETAMINOPHEN 5-325 MG PO TABS: 1.0000 | ORAL_TABLET | ORAL | Status: DC | PRN

## 2013-07-02 MED ORDER — ONDANSETRON HCL 4 MG/2ML IJ SOLN
4.0000 mg | Freq: Once | INTRAMUSCULAR | Status: AC
  Administered 2013-07-02: 4 mg via INTRAVENOUS
  Filled 2013-07-02: qty 2

## 2013-07-02 MED ORDER — MORPHINE SULFATE 4 MG/ML IJ SOLN
4.0000 mg | Freq: Once | INTRAMUSCULAR | Status: AC
  Administered 2013-07-02: 4 mg via INTRAVENOUS
  Filled 2013-07-02: qty 1

## 2013-07-02 MED ORDER — PANTOPRAZOLE SODIUM 40 MG PO TBEC
40.0000 mg | DELAYED_RELEASE_TABLET | Freq: Once | ORAL | Status: AC
  Administered 2013-07-02: 40 mg via ORAL
  Filled 2013-07-02: qty 1

## 2013-07-02 MED ORDER — GI COCKTAIL ~~LOC~~
30.0000 mL | Freq: Once | ORAL | Status: AC
  Administered 2013-07-02: 30 mL via ORAL
  Filled 2013-07-02: qty 30

## 2013-07-02 MED ORDER — PANTOPRAZOLE SODIUM 20 MG PO TBEC: 40.0000 mg | DELAYED_RELEASE_TABLET | Freq: Every day | ORAL | Status: DC

## 2013-07-02 NOTE — ED Notes (Signed)
Patient moaning out in pain. Stating that it hurts to lay down. Sitting up in bed a this time.

## 2013-07-02 NOTE — ED Provider Notes (Signed)
History    CSN: [id number] Arrival date & time 07/01/13  2148  First MD Initiated Contact with Patient 07/01/13 2322     Chief Complaint  Patient presents with  . Abdominal Pain  . Back Pain   (Consider location/radiation/quality/duration/timing/severity/associated sxs/prior Treatment) HPI Comments: Virginia Washington is a 57 y.o. Female presenting with constant, aching upper and lower midline abdominal pain and low back pain which woke her from sleep yesterday morning.  She has had increasing abdominal distention as well.  She denies nausea, vomiting, fever, chest pain, shortness of breath, dysuria, hematuria and flank pain. She has taken tylenol with no relief of her symptoms which are constant.  She has had no change in her appetite and denies any worsened pain with a meal.  Her last bm was yesterday, normal and did not worsen or improve her pain.     The history is provided by the patient.   Past Medical History  Diagnosis Date  . COPD (chronic obstructive pulmonary disease)    Past Surgical History  Procedure Laterality Date  . Abdominal hysterectomy    . Cholecystectomy     History reviewed. No pertinent family history. History  Substance Use Topics  . Smoking status: Current Every Day Smoker -- 1.00 packs/day    Types: Cigarettes  . Smokeless tobacco: Not on file  . Alcohol Use: No   OB History   Grav Para Term Preterm Abortions TAB SAB Ect Mult Living                 Review of Systems  Constitutional: Negative for fever.  HENT: Negative for congestion, sore throat and neck pain.   Eyes: Negative.   Respiratory: Negative for chest tightness and shortness of breath.   Cardiovascular: Negative for chest pain.  Gastrointestinal: Positive for abdominal pain and abdominal distention. Negative for nausea, vomiting and diarrhea.  Genitourinary: Negative.  Negative for dysuria.  Musculoskeletal: Positive for back pain. Negative for joint swelling and arthralgias.   Skin: Negative.  Negative for rash and wound.  Neurological: Negative for dizziness, weakness, light-headedness, numbness and headaches.  Psychiatric/Behavioral: Negative.     Allergies  Review of patient's allergies indicates no known allergies.  Home Medications   Current Outpatient Rx  Name  Route  Sig  Dispense  Refill  . HYDROcodone-acetaminophen (NORCO/VICODIN) 5-325 MG per tablet   Oral   Take 1 tablet by mouth every 4 (four) hours as needed for pain.   15 tablet   0   . pantoprazole (PROTONIX) 20 MG tablet   Oral   Take 2 tablets (40 mg total) by mouth daily.   30 tablet   0    BP 136/87  Pulse 59  Temp(Src) 98.4 F (36.9 C) (Oral)  Resp 18  Ht 5\' 2"  (1.575 m)  Wt 133 lb (60.328 kg)  BMI 24.32 kg/m2  SpO2 98% Physical Exam  Nursing note and vitals reviewed. Constitutional: She appears well-developed and well-nourished.  HENT:  Head: Normocephalic and atraumatic.  Eyes: Conjunctivae are normal.  Neck: Normal range of motion.  Cardiovascular: Normal rate, regular rhythm, normal heart sounds and intact distal pulses.   Pulmonary/Chest: Effort normal and breath sounds normal. She has no wheezes.  Abdominal: Soft. She exhibits distension. Bowel sounds are increased. There is tenderness in the epigastric area, periumbilical area and suprapubic area. There is no rebound, no guarding and no CVA tenderness.  Musculoskeletal: Normal range of motion.  ttp bilateral paralumbar pain,  No midline  pain.  Neurological: She is alert.  Skin: Skin is warm and dry.  Psychiatric: She has a normal mood and affect.    ED Course  Procedures (including critical care time) Labs Reviewed  CBC WITH DIFFERENTIAL - Abnormal; Notable for the following:    Neutrophils Relative % 42 (*)    Lymphocytes Relative 49 (*)    Lymphs Abs 4.4 (*)    All other components within normal limits  COMPREHENSIVE METABOLIC PANEL - Abnormal; Notable for the following:    Glucose, Bld 110 (*)     Albumin 3.4 (*)    Total Bilirubin <0.1 (*)    GFR calc non Af Amer 75 (*)    GFR calc Af Amer 87 (*)    All other components within normal limits  LIPASE, BLOOD - Abnormal; Notable for the following:    Lipase 63 (*)    All other components within normal limits  URINALYSIS, ROUTINE W REFLEX MICROSCOPIC - Abnormal; Notable for the following:    Specific Gravity, Urine >1.030 (*)    All other components within normal limits   Dg Abd Acute W/chest  07/02/2013   *RADIOLOGY REPORT*  Clinical Data: Lower abdominal and back pain for 2 days.  ACUTE ABDOMEN SERIES (ABDOMEN 2 VIEW & CHEST 1 VIEW)  Comparison: 02/28/2013.  Findings: Normal heart size and mediastinal contour.  Coarsened lower lung interstitial markings appears similar to prior, suggesting mild chronic lung disease.  No effusion or pneumothorax.  No abnormal intra-abdominal mass effect or calcification. Cholecystectomy and previous left lower quadrant surgery. Nonobstructive bowel gas pattern.  No pneumoperitoneum.  No significant osseous abnormality.  IMPRESSION:  1. Nonobstructive bowel gas pattern.  No pneumoperitoneum. 2. Bronchitic changes at the bases, similar to previous examinations.   Original Report Authenticated By: Tiburcio Pea   1. Gastritis   2. Low back pain     MDM  Pt was given protonix for gastritis.  Dietary suggestions given.  Resource guide for assistance locating a pcp.  Return here sooner for any worsened sx.    No neuro deficit on exam or by history to suggest emergent or surgical presentation.  Also discussed worsened sx that should prompt immediate re-evaluation including distal weakness, bowel/bladder retention/incontinence.  Pt has no rebound or guarding on abdominal exam,  She appeared fairly comfortable throughout her ed visit.  Exam findings not c/w acute abdomen.   Denies cp,  No sob.  Patients labs and/or radiological studies were viewed and considered during the medical decision making and disposition  process.         Burgess Amor, PA-C 07/02/13 [phone]

## 2013-07-02 NOTE — ED Provider Notes (Signed)
57 year old female with multiple abdominal surgeries comes in with a generalized abdominal pain with some nausea. Exam, abdomen appears distended but tenderness is fairly well localized to the epigastric area. There is no rebound or guarding. Bowel sounds are decreased. Acute abdominal x-rays were viewed by myself and showed no obvious evidence of obstruction. Laboratory workup is pending.  Laboratory workup is significant for trivial elevation of lipase. She will be treated symptomatically with antiemetics and analgesics and also started on a proton pump inhibitor.  Medical screening examination/treatment/procedure(s) were conducted as a shared visit with non-physician practitioner(s) and myself.  I personally evaluated the patient during the encounter   Dione Booze, MD 07/02/13 [phone]

## 2013-10-20 ENCOUNTER — Encounter (HOSPITAL_COMMUNITY): Payer: Self-pay | Admitting: Emergency Medicine

## 2013-10-20 ENCOUNTER — Emergency Department (HOSPITAL_COMMUNITY): Payer: Medicare Other

## 2013-10-20 ENCOUNTER — Emergency Department (HOSPITAL_COMMUNITY)
Admission: EM | Admit: 2013-10-20 | Discharge: 2013-10-20 | Disposition: A | Discharge status: Home / Self Care | Payer: Medicare Other | Attending: Emergency Medicine | Admitting: Emergency Medicine

## 2013-10-20 DIAGNOSIS — F17200 Nicotine dependence, unspecified, uncomplicated: Secondary | ICD-10-CM | POA: Diagnosis not present

## 2013-10-20 DIAGNOSIS — J449 Chronic obstructive pulmonary disease, unspecified: Secondary | ICD-10-CM | POA: Insufficient documentation

## 2013-10-20 DIAGNOSIS — Z79899 Other long term (current) drug therapy: Secondary | ICD-10-CM | POA: Diagnosis not present

## 2013-10-20 DIAGNOSIS — Z9889 Other specified postprocedural states: Secondary | ICD-10-CM | POA: Diagnosis not present

## 2013-10-20 DIAGNOSIS — R112 Nausea with vomiting, unspecified: Secondary | ICD-10-CM | POA: Insufficient documentation

## 2013-10-20 DIAGNOSIS — R1032 Left lower quadrant pain: Secondary | ICD-10-CM | POA: Diagnosis not present

## 2013-10-20 DIAGNOSIS — R109 Unspecified abdominal pain: Secondary | ICD-10-CM | POA: Diagnosis not present

## 2013-10-20 DIAGNOSIS — R509 Fever, unspecified: Secondary | ICD-10-CM | POA: Insufficient documentation

## 2013-10-20 DIAGNOSIS — Z9089 Acquired absence of other organs: Secondary | ICD-10-CM | POA: Diagnosis not present

## 2013-10-20 DIAGNOSIS — J4489 Other specified chronic obstructive pulmonary disease: Secondary | ICD-10-CM | POA: Insufficient documentation

## 2013-10-20 LAB — URINALYSIS, ROUTINE W REFLEX MICROSCOPIC
Glucose, UA: NEGATIVE mg/dL
Hgb urine dipstick: NEGATIVE
Ketones, ur: NEGATIVE mg/dL

## 2013-10-20 LAB — CBC WITH DIFFERENTIAL/PLATELET
Eosinophils Relative: 1 % (ref 0–5)
Lymphocytes Relative: 38 % (ref 12–46)
Lymphs Abs: 3 10*3/uL (ref 0.7–4.0)
MCV: 86.5 fL (ref 78.0–100.0)
Neutro Abs: 4.3 10*3/uL (ref 1.7–7.7)
Platelets: 222 10*3/uL (ref 150–400)
RBC: 4.83 MIL/uL (ref 3.87–5.11)
WBC: 7.9 10*3/uL (ref 4.0–10.5)

## 2013-10-20 LAB — COMPREHENSIVE METABOLIC PANEL
ALT: 12 U/L (ref 0–35)
Alkaline Phosphatase: 105 U/L (ref 39–117)
CO2: 27 mEq/L (ref 19–32)
Calcium: 9.7 mg/dL (ref 8.4–10.5)
Chloride: 104 mEq/L (ref 96–112)
GFR calc Af Amer: 80 mL/min — ABNORMAL LOW (ref >90)
GFR calc non Af Amer: 69 mL/min — ABNORMAL LOW (ref >90)
Glucose, Bld: 95 mg/dL (ref 70–99)
Potassium: 3.9 mEq/L (ref 3.5–5.1)
Sodium: 140 mEq/L (ref 135–145)
Total Bilirubin: 0.4 mg/dL (ref 0.3–1.2)

## 2013-10-20 LAB — URINE MICROSCOPIC-ADD ON

## 2013-10-20 MED ORDER — IOHEXOL 300 MG/ML  SOLN
50.0000 mL | Freq: Once | INTRAMUSCULAR | Status: AC | PRN
  Administered 2013-10-20: 50 mL via ORAL

## 2013-10-20 MED ORDER — TRAMADOL HCL 50 MG PO TABS: 50.0000 mg | ORAL_TABLET | Freq: Four times a day (QID) | ORAL | Status: DC | PRN

## 2013-10-20 MED ORDER — IOHEXOL 300 MG/ML  SOLN
100.0000 mL | Freq: Once | INTRAMUSCULAR | Status: AC | PRN
  Administered 2013-10-20: 100 mL via INTRAVENOUS

## 2013-10-20 NOTE — ED Notes (Signed)
Left flank pain, n/v x 2 since yesterday. Denies urinary changes. Denies diarrhea. Mild grimacing noted. C/o tingling pains all over body intermittent;

## 2013-10-20 NOTE — ED Notes (Signed)
Sharp, tingling L flank pain radiating to lower abdomen began yesterday about 1700.  Nothing relieves or aggravates pain, no temporal associations.  Pain is intermittent  Has taken nothing for pain.  Nausea and vomiting x 3 today w/associated dizziness.

## 2013-10-20 NOTE — ED Provider Notes (Signed)
CSN: [id number]     Arrival date & time 10/20/13  1616 History  This chart was scribed for Virginia Lennert, MD by Leone Payor, ED Scribe. This patient was seen in room APA03/[facility]03 and the patient's care was started 6:26 PM.    Chief Complaint  Patient presents with  . Flank Pain    Patient is a 57 y.o. female presenting with flank pain. The history is provided by the patient. No language interpreter was used.  Flank Pain This is a new problem. The current episode started 2 days ago. The problem occurs constantly. The problem has been gradually worsening. Associated symptoms include abdominal pain. Pertinent negatives include no chest pain and no headaches. Nothing aggravates the symptoms. Nothing relieves the symptoms. She has tried nothing for the symptoms.    HPI Comments: Virginia Washington is a 57 y.o. female with past medical history of cholecystectomy, abdominal hysterectomy who presents to the Emergency Department complaining of 2 days of gradual onset, gradually worsening, constant LLQ and left flank pain. Pt also has associated fever, chills, nausea, 2-3 episodes of emesis. Pt has not tried any OTC medications at home for these symptoms. Pt denies diarrhea, urinary symptoms.   Past Medical History  Diagnosis Date  . COPD (chronic obstructive pulmonary disease)    Past Surgical History  Procedure Laterality Date  . Abdominal hysterectomy    . Cholecystectomy     History reviewed. No pertinent family history. History  Substance Use Topics  . Smoking status: Current Every Day Smoker -- 1.00 packs/day    Types: Cigarettes  . Smokeless tobacco: Not on file  . Alcohol Use: No   OB History   Grav Para Term Preterm Abortions TAB SAB Ect Mult Living                 Review of Systems  Constitutional: Positive for fever and chills. Negative for appetite change and fatigue.  HENT: Negative for congestion, ear discharge and sinus pressure.   Eyes: Negative for discharge.   Respiratory: Negative for cough.   Cardiovascular: Negative for chest pain.  Gastrointestinal: Positive for nausea, vomiting and abdominal pain. Negative for diarrhea.  Genitourinary: Positive for flank pain. Negative for frequency and hematuria.  Musculoskeletal: Negative for back pain.  Skin: Negative for rash.  Neurological: Negative for seizures and headaches.  Psychiatric/Behavioral: Negative for hallucinations.    Allergies  Review of patient's allergies indicates no known allergies.  Home Medications   Current Outpatient Rx  Name  Route  Sig  Dispense  Refill  . HYDROcodone-acetaminophen (NORCO/VICODIN) 5-325 MG per tablet   Oral   Take 1 tablet by mouth every 4 (four) hours as needed for pain.   15 tablet   0   . pantoprazole (PROTONIX) 20 MG tablet   Oral   Take 2 tablets (40 mg total) by mouth daily.   30 tablet   0    BP 141/71  Pulse 86  Temp(Src) 99.3 F (37.4 C) (Oral)  Resp 22  SpO2 99% Physical Exam  Nursing note and vitals reviewed. Constitutional: She is oriented to person, place, and time. She appears well-developed.  HENT:  Head: Normocephalic.  Eyes: Conjunctivae and EOM are normal. No scleral icterus.  Neck: Neck supple. No thyromegaly present.  Cardiovascular: Normal rate and regular rhythm.  Exam reveals no gallop and no friction rub.   No murmur heard. Pulmonary/Chest: No stridor. She has no wheezes. She has no rales. She exhibits no tenderness.  Abdominal:  She exhibits no distension. There is tenderness (moderate LLQ tenderness ). There is no rebound.  Musculoskeletal: Normal range of motion. She exhibits no edema.  Lymphadenopathy:    She has no cervical adenopathy.  Neurological: She is oriented to person, place, and time. She exhibits normal muscle tone. Coordination normal.  Skin: No rash noted. No erythema.  Psychiatric: She has a normal mood and affect. Her behavior is normal.    ED Course  Procedures   DIAGNOSTIC  STUDIES: Oxygen Saturation is 99% on RA, normal by my interpretation.    COORDINATION OF CARE: 6:25 PM Discussed treatment plan with pt at bedside and pt agreed to plan.   8:48 PM Discussed negative labs and imaging results. Will discharge with pain medication.    Labs Review Labs Reviewed  URINALYSIS, ROUTINE W REFLEX MICROSCOPIC - Abnormal; Notable for the following:    Specific Gravity, Urine >1.030 (*)    Bilirubin Urine SMALL (*)    Protein, ur TRACE (*)    All other components within normal limits  URINE MICROSCOPIC-ADD ON - Abnormal; Notable for the following:    Squamous Epithelial / LPF FEW (*)    Bacteria, UA FEW (*)    All other components within normal limits  COMPREHENSIVE METABOLIC PANEL - Abnormal; Notable for the following:    GFR calc non Af Amer 69 (*)    GFR calc Af Amer 80 (*)    All other components within normal limits  CBC WITH DIFFERENTIAL   Imaging Review Ct Abdomen Pelvis W Contrast  10/20/2013   CLINICAL DATA:  Left lower quadrant and left flank pain. Fever and chills. Nausea and vomiting.  EXAM: CT ABDOMEN AND PELVIS WITH CONTRAST  TECHNIQUE: Multidetector CT imaging of the abdomen and pelvis was performed using the standard protocol following bolus administration of intravenous contrast.  CONTRAST:  50mL OMNIPAQUE IOHEXOL 300 MG/ML SOLN, OMNIPAQUE IOHEXOL 300 MG/ML SOLN  COMPARISON:  Radiographs dated 07/02/2013 and CT scan dated 02/04/2012  FINDINGS: Gallbladder, uterus, and ovaries have been removed. Liver parenchyma, biliary tree, spleen, pancreas, adrenal glands, and kidneys are normal except for a tiny cyst in the medial aspect of the right kidney, 5 mm. The bowel appears normal including the terminal ileum and appendix. No diverticular disease. No free air or free fluid in the abdomen. No osseous abnormalities. The bladder appears normal. Moderate calcification in the abdominal aorta.  IMPRESSION: Benign appearing abdomen and pelvis.    Electronically Signed   By: Geanie Cooley M.D.   On: 10/20/2013 20:36    EKG Interpretation   None       MDM  No diagnosis found.  The chart was scribed for me under my direct supervision.  I personally performed the history, physical, and medical decision making and all procedures in the evaluation of this patient.Virginia Lennert, MD 10/20/13 2052

## 2013-10-24 ENCOUNTER — Emergency Department (HOSPITAL_COMMUNITY)
Admission: EM | Admit: 2013-10-24 | Discharge: 2013-10-24 | Discharge status: Against Medical Advice | Payer: Medicare Other | Attending: Emergency Medicine | Admitting: Emergency Medicine

## 2013-10-24 ENCOUNTER — Encounter (HOSPITAL_COMMUNITY): Payer: Self-pay | Admitting: Emergency Medicine

## 2013-10-24 DIAGNOSIS — F17200 Nicotine dependence, unspecified, uncomplicated: Secondary | ICD-10-CM | POA: Insufficient documentation

## 2013-10-24 DIAGNOSIS — J449 Chronic obstructive pulmonary disease, unspecified: Secondary | ICD-10-CM | POA: Diagnosis not present

## 2013-10-24 DIAGNOSIS — J4489 Other specified chronic obstructive pulmonary disease: Secondary | ICD-10-CM | POA: Insufficient documentation

## 2013-10-24 NOTE — ED Notes (Signed)
Seen here on MOnday for similar sx. Body aches, cough.  Rt ear hurts, hot then cold.

## 2013-10-24 NOTE — ED Notes (Signed)
Pt called twice to come back to room.  No answer

## 2013-10-27 ENCOUNTER — Emergency Department (HOSPITAL_COMMUNITY): Payer: Medicare Other

## 2013-10-27 ENCOUNTER — Emergency Department (HOSPITAL_COMMUNITY)
Admission: EM | Admit: 2013-10-27 | Discharge: 2013-10-27 | Disposition: A | Discharge status: Home / Self Care | Payer: Medicare Other | Attending: Emergency Medicine | Admitting: Emergency Medicine

## 2013-10-27 ENCOUNTER — Encounter (HOSPITAL_COMMUNITY): Payer: Self-pay | Admitting: Emergency Medicine

## 2013-10-27 DIAGNOSIS — R112 Nausea with vomiting, unspecified: Secondary | ICD-10-CM | POA: Insufficient documentation

## 2013-10-27 DIAGNOSIS — F17200 Nicotine dependence, unspecified, uncomplicated: Secondary | ICD-10-CM | POA: Insufficient documentation

## 2013-10-27 DIAGNOSIS — J449 Chronic obstructive pulmonary disease, unspecified: Secondary | ICD-10-CM | POA: Insufficient documentation

## 2013-10-27 DIAGNOSIS — J069 Acute upper respiratory infection, unspecified: Secondary | ICD-10-CM

## 2013-10-27 DIAGNOSIS — J4489 Other specified chronic obstructive pulmonary disease: Secondary | ICD-10-CM | POA: Insufficient documentation

## 2013-10-27 DIAGNOSIS — R52 Pain, unspecified: Secondary | ICD-10-CM | POA: Diagnosis not present

## 2013-10-27 DIAGNOSIS — R059 Cough, unspecified: Secondary | ICD-10-CM | POA: Insufficient documentation

## 2013-10-27 DIAGNOSIS — R05 Cough: Secondary | ICD-10-CM | POA: Insufficient documentation

## 2013-10-27 DIAGNOSIS — R42 Dizziness and giddiness: Secondary | ICD-10-CM | POA: Diagnosis not present

## 2013-10-27 DIAGNOSIS — J42 Unspecified chronic bronchitis: Secondary | ICD-10-CM | POA: Diagnosis not present

## 2013-10-27 LAB — CBC WITH DIFFERENTIAL/PLATELET
Basophils Absolute: 0.1 10*3/uL (ref 0.0–0.1)
Eosinophils Absolute: 0.1 10*3/uL (ref 0.0–0.7)
Eosinophils Relative: 1 % (ref 0–5)
Hemoglobin: 15.2 g/dL — ABNORMAL HIGH (ref 12.0–15.0)
Lymphs Abs: 2.8 10*3/uL (ref 0.7–4.0)
MCH: 28.2 pg (ref 26.0–34.0)
MCV: 86.5 fL (ref 78.0–100.0)
Neutro Abs: 2.3 10*3/uL (ref 1.7–7.7)
Neutrophils Relative %: 41 % — ABNORMAL LOW (ref 43–77)
Platelets: 293 10*3/uL (ref 150–400)
RBC: 5.39 MIL/uL — ABNORMAL HIGH (ref 3.87–5.11)
WBC: 5.6 10*3/uL (ref 4.0–10.5)

## 2013-10-27 LAB — COMPREHENSIVE METABOLIC PANEL
AST: 17 U/L (ref 0–37)
BUN: 9 mg/dL (ref 6–23)
CO2: 27 mEq/L (ref 19–32)
Calcium: 10 mg/dL (ref 8.4–10.5)
Chloride: 102 mEq/L (ref 96–112)
Creatinine, Ser: 0.98 mg/dL (ref 0.50–1.10)
GFR calc Af Amer: 74 mL/min — ABNORMAL LOW (ref >90)
GFR calc non Af Amer: 64 mL/min — ABNORMAL LOW (ref >90)
Glucose, Bld: 99 mg/dL (ref 70–99)
Total Bilirubin: 0.4 mg/dL (ref 0.3–1.2)
Total Protein: 8 g/dL (ref 6.0–8.3)

## 2013-10-27 MED ORDER — ALBUTEROL SULFATE HFA 108 (90 BASE) MCG/ACT IN AERS
2.0000 | INHALATION_SPRAY | RESPIRATORY_TRACT | Status: AC
  Administered 2013-10-27: 2 via RESPIRATORY_TRACT
  Filled 2013-10-27: qty 6.7

## 2013-10-27 MED ORDER — IBUPROFEN 400 MG PO TABS
400.0000 mg | ORAL_TABLET | Freq: Once | ORAL | Status: AC
  Administered 2013-10-27: 400 mg via ORAL
  Filled 2013-10-27: qty 1

## 2013-10-27 MED ORDER — ACETAMINOPHEN 325 MG PO TABS
650.0000 mg | ORAL_TABLET | Freq: Once | ORAL | Status: AC
  Administered 2013-10-27: 650 mg via ORAL
  Filled 2013-10-27: qty 2

## 2013-10-27 MED ORDER — BENZONATATE 100 MG PO CAPS
100.0000 mg | ORAL_CAPSULE | Freq: Three times a day (TID) | ORAL | Status: DC | PRN
Start: 2013-10-27 — End: 2014-05-14

## 2013-10-27 NOTE — ED Notes (Signed)
Dizziness for 1 week , has been seen here for same.  Nausea, vomiting.  Cough Headache.

## 2013-10-27 NOTE — ED Provider Notes (Signed)
CSN: [id number]     Arrival date & time 10/27/13  1048 History   First MD Initiated Contact with Patient 10/27/13 1526     Chief Complaint  Patient presents with  . Cough    HPI Pt was seen at 1535.   Per pt, c/o gradual onset and persistence of constant runny/stuffy nose, sinus congestion, ears congestion, chills, generalized body aches, and cough for the past 1-2 weeks. Has been associated with nausea and 2-3 episodes of vomiting as well as vague "dizziness."  Pt came to the ED 3 days ago for same and left AMA. Pt was evaluated in the ED 1 week ago for abd pain with reassuring work up. Denies fevers, no rash, no CP/SOB, no diarrhea, no abd pain, no back pain.     Past Medical History  Diagnosis Date  . COPD (chronic obstructive pulmonary disease)    Past Surgical History  Procedure Laterality Date  . Abdominal hysterectomy    . Cholecystectomy      History  Substance Use Topics  . Smoking status: Current Every Day Smoker -- 1.00 packs/day    Types: Cigarettes  . Smokeless tobacco: Not on file  . Alcohol Use: No    Review of Systems ROS: Statement: All systems negative except as marked or noted in the HPI; Constitutional: Negative for fever and +chills, generalized body aches, "dizzy." ; ; Eyes: Negative for eye pain, redness and discharge. ; ; ENMT: Negative for hoarseness, sore throat. +ears congestion, nasal congestion, sinus pressure and rhinorrhea. ; ; Cardiovascular: Negative for chest pain, palpitations, diaphoresis, dyspnea and peripheral edema. ; ; Respiratory: +cough. Negative for wheezing and stridor. ; ; Gastrointestinal: +N/V. Negative for diarrhea, abdominal pain, blood in stool, hematemesis, jaundice and rectal bleeding. . ; ; Genitourinary: Negative for dysuria, flank pain and hematuria. ; ; Musculoskeletal: Negative for back pain and neck pain. Negative for swelling and trauma.; ; Skin: Negative for pruritus, rash, abrasions, blisters, bruising and skin lesion.; ;  Neuro: Negative for headache, lightheadedness and neck stiffness. Negative for weakness, altered level of consciousness , altered mental status, extremity weakness, paresthesias, involuntary movement, seizure and syncope.      Allergies  Review of patient's allergies indicates no known allergies.  Home Medications   Current Outpatient Rx  Name  Route  Sig  Dispense  Refill  . traMADol (ULTRAM) 50 MG tablet   Oral   Take 1 tablet (50 mg total) by mouth every 6 (six) hours as needed for pain.   15 tablet   0    BP 120/79  Pulse 82  Temp(Src) 98.6 F (37 C) (Oral)  Resp 20  Ht 5\' 2"  (1.575 m)  Wt 135 lb (61.236 kg)  BMI 24.69 kg/m2  SpO2 98% Physical Exam 1540: Physical examination:  Nursing notes reviewed; Vital signs and O2 SAT reviewed;  Constitutional: Well developed, Well nourished, Well hydrated, In no acute distress; Head:  Normocephalic, atraumatic; Eyes: EOMI, PERRL, No scleral icterus; ENMT: TM's clear bilat. +edemetous nasal turbinates bilat with clear rhinorrhea. Mouth and pharynx normal, Mucous membranes moist; Neck: Supple, Full range of motion, No lymphadenopathy; Cardiovascular: Regular rate and rhythm, No gallop; Respiratory: Breath sounds clear & equal bilaterally, No wheezes. Speaking full sentences with ease, Normal respiratory effort/excursion; Chest: Nontender, Movement normal; Abdomen: Soft, Nontender, Nondistended, Normal bowel sounds; Genitourinary: No CVA tenderness; Extremities: Pulses normal, No tenderness, No edema, No calf edema or asymmetry.; Neuro: AA&Ox3, Major CN grossly intact.  Speech clear. Climbs on and off  stretcher easily by herself. Gait steady. No gross focal motor or sensory deficits in extremities.; Skin: Color normal, Warm, Dry.   ED Course  Procedures    EKG Interpretation   None       MDM  MDM Reviewed: previous chart, nursing note and vitals Reviewed previous: labs, x-ray and CT scan Interpretation: labs and x-ray   Results  for orders placed during the hospital encounter of 10/27/13  COMPREHENSIVE METABOLIC PANEL      Result Value Range   Sodium 140  135 - 145 mEq/L   Potassium 4.0  3.5 - 5.1 mEq/L   Chloride 102  96 - 112 mEq/L   CO2 27  19 - 32 mEq/L   Glucose, Bld 99  70 - 99 mg/dL   BUN 9  6 - 23 mg/dL   Creatinine, Ser 1.61  0.50 - 1.10 mg/dL   Calcium 09.6  8.4 - 04.5 mg/dL   Total Protein 8.0  6.0 - 8.3 g/dL   Albumin 4.1  3.5 - 5.2 g/dL   AST 17  0 - 37 U/L   ALT 15  0 - 35 U/L   Alkaline Phosphatase 123 (*) 39 - 117 U/L   Total Bilirubin 0.4  0.3 - 1.2 mg/dL   GFR calc non Af Amer 64 (*) >90 mL/min   GFR calc Af Amer 74 (*) >90 mL/min  CBC WITH DIFFERENTIAL      Result Value Range   WBC 5.6  4.0 - 10.5 K/uL   RBC 5.39 (*) 3.87 - 5.11 MIL/uL   Hemoglobin 15.2 (*) 12.0 - 15.0 g/dL   HCT 40.9 (*) 81.1 - 91.4 %   MCV 86.5  78.0 - 100.0 fL   MCH 28.2  26.0 - 34.0 pg   MCHC 32.6  30.0 - 36.0 g/dL   RDW 78.2  95.6 - 21.3 %   Platelets 293  150 - 400 K/uL   Neutrophils Relative % 41 (*) 43 - 77 %   Neutro Abs 2.3  1.7 - 7.7 K/uL   Lymphocytes Relative 49 (*) 12 - 46 %   Lymphs Abs 2.8  0.7 - 4.0 K/uL   Monocytes Relative 7  3 - 12 %   Monocytes Absolute 0.4  0.1 - 1.0 K/uL   Eosinophils Relative 1  0 - 5 %   Eosinophils Absolute 0.1  0.0 - 0.7 K/uL   Basophils Relative 1  0 - 1 %   Basophils Absolute 0.1  0.0 - 0.1 K/uL   Dg Chest 2 View 10/27/2013   CLINICAL DATA:  Cough, smoker, dizziness  EXAM: CHEST  2 VIEW  COMPARISON:  01/13/2013  FINDINGS: Similar chronic bronchitic changes. No focal pneumonia, collapse or consolidation. No effusion or pneumothorax. Trachea midline. Prior cholecystectomy evident.  IMPRESSION: Chronic bronchitic changes. No interval change or acute process   Electronically Signed   By: Ruel Favors M.D.   On: 10/27/2013 11:57   Ct Abdomen Pelvis W Contrast 10/20/2013   CLINICAL DATA:  Left lower quadrant and left flank pain. Fever and chills. Nausea and vomiting.   EXAM: CT ABDOMEN AND PELVIS WITH CONTRAST  TECHNIQUE: Multidetector CT imaging of the abdomen and pelvis was performed using the standard protocol following bolus administration of intravenous contrast.  CONTRAST:  50mL OMNIPAQUE IOHEXOL 300 MG/ML SOLN, OMNIPAQUE IOHEXOL 300 MG/ML SOLN  COMPARISON:  Radiographs dated 07/02/2013 and CT scan dated 02/04/2012  FINDINGS: Gallbladder, uterus, and ovaries have been removed. Liver parenchyma, biliary  tree, spleen, pancreas, adrenal glands, and kidneys are normal except for a tiny cyst in the medial aspect of the right kidney, 5 mm. The bowel appears normal including the terminal ileum and appendix. No diverticular disease. No free air or free fluid in the abdomen. No osseous abnormalities. The bladder appears normal. Moderate calcification in the abdominal aorta.  IMPRESSION: Benign appearing abdomen and pelvis.   Electronically Signed   By: Geanie Cooley M.D.   On: 10/20/2013 20:36    1700:  Pt has tol PO well while in the ED without N/V.  No stooling while in the ED.  Abd remains benign, resps easy, VSS. Not orthostatic. Ambulatory in the ED with steady gait, easy resps, NAD. Feels better after tylenol/motrin/MDI and wants to go home now. Will tx symptomatically at this time. Dx and testing d/w pt.  Questions answered.  Verb understanding, agreeable to d/c home with outpt f/u.       Laray Anger, DO 10/30/13 1640

## 2014-01-12 ENCOUNTER — Encounter (HOSPITAL_COMMUNITY): Payer: Self-pay | Admitting: Emergency Medicine

## 2014-01-12 ENCOUNTER — Emergency Department (HOSPITAL_COMMUNITY)
Admission: EM | Admit: 2014-01-12 | Discharge: 2014-01-12 | Disposition: A | Discharge status: Home / Self Care | Payer: Medicare Other | Attending: Emergency Medicine | Admitting: Emergency Medicine

## 2014-01-12 DIAGNOSIS — M542 Cervicalgia: Secondary | ICD-10-CM | POA: Diagnosis not present

## 2014-01-12 DIAGNOSIS — R519 Headache, unspecified: Secondary | ICD-10-CM

## 2014-01-12 DIAGNOSIS — J4489 Other specified chronic obstructive pulmonary disease: Secondary | ICD-10-CM | POA: Insufficient documentation

## 2014-01-12 DIAGNOSIS — F17200 Nicotine dependence, unspecified, uncomplicated: Secondary | ICD-10-CM | POA: Diagnosis not present

## 2014-01-12 DIAGNOSIS — Z79899 Other long term (current) drug therapy: Secondary | ICD-10-CM | POA: Insufficient documentation

## 2014-01-12 DIAGNOSIS — R51 Headache: Secondary | ICD-10-CM | POA: Insufficient documentation

## 2014-01-12 DIAGNOSIS — J449 Chronic obstructive pulmonary disease, unspecified: Secondary | ICD-10-CM | POA: Diagnosis not present

## 2014-01-12 MED ORDER — TRAMADOL HCL 50 MG PO TABS: 50.0000 mg | ORAL_TABLET | Freq: Four times a day (QID) | ORAL | Status: DC | PRN

## 2014-01-12 MED ORDER — PROCHLORPERAZINE EDISYLATE 5 MG/ML IJ SOLN
10.0000 mg | Freq: Once | INTRAMUSCULAR | Status: AC
  Administered 2014-01-12: 10 mg via INTRAMUSCULAR
  Filled 2014-01-12: qty 2

## 2014-01-12 MED ORDER — KETOROLAC TROMETHAMINE 30 MG/ML IJ SOLN
30.0000 mg | Freq: Once | INTRAMUSCULAR | Status: AC
  Administered 2014-01-12: 30 mg via INTRAMUSCULAR
  Filled 2014-01-12: qty 1

## 2014-01-12 MED ORDER — DIPHENHYDRAMINE HCL 25 MG PO CAPS
25.0000 mg | ORAL_CAPSULE | Freq: Once | ORAL | Status: AC
  Administered 2014-01-12: 25 mg via ORAL
  Filled 2014-01-12: qty 1

## 2014-01-12 NOTE — ED Notes (Signed)
Complain of headache from the top of here head to left side of neck. Denies other symptoms. States she came here because she wanted to see if her blood pressure was up and she has taken her last pain pill

## 2014-01-12 NOTE — ED Notes (Signed)
C/o headache that radiates down left side of neck.

## 2014-01-12 NOTE — Discharge Instructions (Signed)
Headaches, Frequently Asked Questions MIGRAINE HEADACHES Q: What is migraine? What causes it? How can I treat it? A: Generally, migraine headaches begin as a dull ache. Then they develop into a constant, throbbing, and pulsating pain. You may experience pain at the temples. You may experience pain at the front or back of one or both sides of the head. The pain is usually accompanied by a combination of:  Nausea.  Vomiting.  Sensitivity to light and noise. Some people (about 15%) experience an aura (see below) before an attack. The cause of migraine is believed to be chemical reactions in the brain. Treatment for migraine may include over-the-counter or prescription medications. It may also include self-help techniques. These include relaxation training and biofeedback.  Q: What is an aura? A: About 15% of people with migraine get an "aura". This is a sign of neurological symptoms that occur before a migraine headache. You may see wavy or jagged lines, dots, or flashing lights. You might experience tunnel vision or blind spots in one or both eyes. The aura can include visual or auditory hallucinations (something imagined). It may include disruptions in smell (such as strange odors), taste or touch. Other symptoms include:  Numbness.  A "pins and needles" sensation.  Difficulty in recalling or speaking the correct word. These neurological events may last as long as 60 minutes. These symptoms will fade as the headache begins. Q: What is a trigger? A: Certain physical or environmental factors can lead to or "trigger" a migraine. These include:  Foods.  Hormonal changes.  Weather.  Stress. It is important to remember that triggers are different for everyone. To help prevent migraine attacks, you need to figure out which triggers affect you. Keep a headache diary. This is a good way to track triggers. The diary will help you talk to your healthcare professional about your condition. Q: Does  weather affect migraines? A: Bright sunshine, hot, humid conditions, and drastic changes in barometric pressure may lead to, or "trigger," a migraine attack in some people. But studies have shown that weather does not act as a trigger for everyone with migraines. Q: What is the link between migraine and hormones? A: Hormones start and regulate many of your body's functions. Hormones keep your body in balance within a constantly changing environment. The levels of hormones in your body are unbalanced at times. Examples are during menstruation, pregnancy, or menopause. That can lead to a migraine attack. In fact, about three quarters of all women with migraine report that their attacks are related to the menstrual cycle.  Q: Is there an increased risk of stroke for migraine sufferers? A: The likelihood of a migraine attack causing a stroke is very remote. That is not to say that migraine sufferers cannot have a stroke associated with their migraines. In persons under age 56, the most common associated factor for stroke is migraine headache. But over the course of a person's normal life span, the occurrence of migraine headache may actually be associated with a reduced risk of dying from cerebrovascular disease due to stroke.  Q: What are acute medications for migraine? A: Acute medications are used to treat the pain of the headache after it has started. Examples over-the-counter medications, NSAIDs, ergots, and triptans.  Q: What are the triptans? A: Triptans are the newest class of abortive medications. They are specifically targeted to treat migraine. Triptans are vasoconstrictors. They moderate some chemical reactions in the brain. The triptans work on receptors in your brain. Triptans help  to restore the balance of a neurotransmitter called serotonin. Fluctuations in levels of serotonin are thought to be a main cause of migraine.  °Q: Are over-the-counter medications for migraine effective? °A:  Over-the-counter, or "OTC," medications may be effective in relieving mild to moderate pain and associated symptoms of migraine. But you should see your caregiver before beginning any treatment regimen for migraine.  °Q: What are preventive medications for migraine? °A: Preventive medications for migraine are sometimes referred to as "prophylactic" treatments. They are used to reduce the frequency, severity, and length of migraine attacks. Examples of preventive medications include antiepileptic medications, antidepressants, beta-blockers, calcium channel blockers, and NSAIDs (nonsteroidal anti-inflammatory drugs). °Q: Why are anticonvulsants used to treat migraine? °A: During the past few years, there has been an increased interest in antiepileptic drugs for the prevention of migraine. They are sometimes referred to as "anticonvulsants". Both epilepsy and migraine may be caused by similar reactions in the brain.  °Q: Why are antidepressants used to treat migraine? °A: Antidepressants are typically used to treat people with depression. They may reduce migraine frequency by regulating chemical levels, such as serotonin, in the brain.  °Q: What alternative therapies are used to treat migraine? °A: The term "alternative therapies" is often used to describe treatments considered outside the scope of conventional Western medicine. Examples of alternative therapy include acupuncture, acupressure, and yoga. Another common alternative treatment is herbal therapy. Some herbs are believed to relieve headache pain. Always discuss alternative therapies with your caregiver before proceeding. Some herbal products contain arsenic and other toxins. °TENSION HEADACHES °Q: What is a tension-type headache? What causes it? How can I treat it? °A: Tension-type headaches occur randomly. They are often the result of temporary stress, anxiety, fatigue, or anger. Symptoms include soreness in your temples, a tightening band-like sensation  around your head (a "vice-like" ache). Symptoms can also include a pulling feeling, pressure sensations, and contracting head and neck muscles. The headache begins in your forehead, temples, or the back of your head and neck. Treatment for tension-type headache may include over-the-counter or prescription medications. Treatment may also include self-help techniques such as relaxation training and biofeedback. °CLUSTER HEADACHES °Q: What is a cluster headache? What causes it? How can I treat it? °A: Cluster headache gets its name because the attacks come in groups. The pain arrives with little, if any, warning. It is usually on one side of the head. A tearing or bloodshot eye and a runny nose on the same side of the headache may also accompany the pain. Cluster headaches are believed to be caused by chemical reactions in the brain. They have been described as the most severe and intense of any headache type. Treatment for cluster headache includes prescription medication and oxygen. °SINUS HEADACHES °Q: What is a sinus headache? What causes it? How can I treat it? °A: When a cavity in the bones of the face and skull (a sinus) becomes inflamed, the inflammation will cause localized pain. This condition is usually the result of an allergic reaction, a tumor, or an infection. If your headache is caused by a sinus blockage, such as an infection, you will probably have a fever. An x-ray will confirm a sinus blockage. Your caregiver's treatment might include antibiotics for the infection, as well as antihistamines or decongestants.  °REBOUND HEADACHES °Q: What is a rebound headache? What causes it? How can I treat it? °A: A pattern of taking acute headache medications too often can lead to a condition known as "rebound headache."   A pattern of taking too much headache medication includes taking it more than 2 days per week or in excessive amounts. That means more than the label or a caregiver advises. With rebound  headaches, your medications not only stop relieving pain, they actually begin to cause headaches. Doctors treat rebound headache by tapering the medication that is being overused. Sometimes your caregiver will gradually substitute a different type of treatment or medication. Stopping may be a challenge. Regularly overusing a medication increases the potential for serious side effects. Consult a caregiver if you regularly use headache medications more than 2 days per week or more than the label advises. ADDITIONAL QUESTIONS AND ANSWERS Q: What is biofeedback? A: Biofeedback is a self-help treatment. Biofeedback uses special equipment to monitor your body's involuntary physical responses. Biofeedback monitors:  Breathing.  Pulse.  Heart rate.  Temperature.  Muscle tension.  Brain activity. Biofeedback helps you refine and perfect your relaxation exercises. You learn to control the physical responses that are related to stress. Once the technique has been mastered, you do not need the equipment any more. Q: Are headaches hereditary? A: Four out of five (80%) of people that suffer report a family history of migraine. Scientists are not sure if this is genetic or a family predisposition. Despite the uncertainty, a child has a 50% chance of having migraine if one parent suffers. The child has a 75% chance if both parents suffer.  Q: Can children get headaches? A: By the time they reach high school, most young people have experienced some type of headache. Many safe and effective approaches or medications can prevent a headache from occurring or stop it after it has begun.  Q: What type of doctor should I see to diagnose and treat my headache? A: Start with your primary caregiver. Discuss his or her experience and approach to headaches. Discuss methods of classification, diagnosis, and treatment. Your caregiver may decide to recommend you to a headache specialist, depending upon your symptoms or other  physical conditions. Having diabetes, allergies, etc., may require a more comprehensive and inclusive approach to your headache. The National Headache Foundation will provide, upon request, a list of [facility] physician members in your state. Document Released: 02/24/2004 Document Revised: 02/26/2012 Document Reviewed: 08/03/2008 [facility] Patient Information 2014 [place].   Emergency Department Resource Guide 1) Find a Doctor and Pay Out of Pocket Although you won't have to find out who is covered by your insurance plan, it is a good idea to ask around and get recommendations. You will then need to call the office and see if the doctor you have chosen will accept you as a new patient and what types of options they offer for patients who are self-pay. Some doctors offer discounts or will set up payment plans for their patients who do not have insurance, but you will need to ask so you aren't surprised when you get to your appointment.  2) Contact Your Local Health Department Not all health departments have doctors that can see patients for sick visits, but many do, so it is worth a call to see if yours does. If you don't know where your local health department is, you can check in your phone book. The CDC also has a tool to help you locate your state's health department, and many state websites also have listings of all of their local health departments.  3) Find a [facility] If your illness is not likely to be very severe or complicated, you may  want to try a walk in clinic. These are popping up all over the country in pharmacies, drugstores, and shopping centers. They're usually staffed by nurse practitioners or physician assistants that have been trained to treat common illnesses and complaints. They're usually fairly quick and inexpensive. However, if you have serious medical issues or chronic medical problems, these are probably not your best option.  No Primary Care Doctor: - Call Health  Connect at  [phone] - they can help you locate a primary care doctor that  accepts your insurance, provides certain services, etc. - Physician Referral Service- [phone]  Chronic Pain Problems: Organization         Address  Phone   Notes  [facility]  [phone] Patients need to be referred by their primary care doctor.   Medication Assistance: Organization         Address  Phone   Notes  [facility] Medication [facility] [place] [address] [place], [place] [address] [phone] --Must be a resident of [facility] -- Must have NO insurance coverage whatsoever (no Medicaid/ Medicare, etc.) -- The pt. MUST have a primary care doctor that directs their care regularly and follows them in the community   MedAssist  [phone]   [employer]  [phone]    Agencies that provide inexpensive medical care: Organization         Address  Phone   Notes  [place]  [phone]   [facility]    [phone]   [facility] [place], [place] [address] [phone]   [place] [address] [place] [phone]   Planned Parenthood    [phone]   [facility]    [phone]   [place] [address], [place] Phone:  [phone], Fax:  [phone] Hours of Operation:  9 am - 6 pm, M-F.  Also accepts Medicaid/Medicare and self-pay.  [facility] for [place] [address], [address], [place] Phone: [phone], Fax: [phone]. Hours of Operation:  8:30 am - 5:30 pm, M-F.  Also accepts Medicaid and self-pay.  [facility] [place] [address] [place] Phone: [phone]   [place], [place], [place] [phone], Ext. [phone] Mondays & Thursdays: 7-9 AM.  First 15 patients are seen on a first come, first serve basis.     [place] Providers:  Organization         Address  Phone   Notes  [facility] [address],  [phone] Also accepts self-pay patients.  [facility] [address], [place]  [phone]   [place], [address], [place] [phone]   [facility] [address], [place] [phone]   Lucianne Lei [address], [place]   [phone] Only accepts [place] Access [place] patients after they have their name applied to their card.   Self-Pay (no insurance) in [facility]:  Organization         Address  Phone   Notes  Sickle Cell Patients, [facility] [place] [phone]   [facility] Urgent Care [place] [phone])  [facility] [place]  [place], [address], [place] [phone]   [facility]/Dr. Osei-Bonsu  [address] [place] or [address], [place] [phone] Phone number for both [place] and [place] locations is the same.  Urgent Medical and [facility] [address], [place] [phone]   [facility] [address], [place] or [address] [phone]   [facility] [address] [place] [phone], phone; [phone], fax Sees patients 1st and 3rd Saturday of every month.  Must not qualify for public or private insurance (i.e. Medicaid, Medicare, [employer] Health Choice, Veterans' Benefits)  Household income should be no more than 200% of the poverty level The clinic cannot treat you if you are pregnant or think you are pregnant  Sexually transmitted diseases are not treated at the clinic.    Dental Care: Organization         Address  Phone  Notes  [facility]  Department of [facility] [place] [phone] Accepts children up to age 44 who are enrolled in [place] or [place]; pregnant women with a Medicaid card; and children who have applied for Medicaid or [place] Health Choice, but were declined, whose parents can pay a reduced fee at time of service.  [facility] Department of [facility]  [address], [place] [phone] Accepts children up to age 22 who are enrolled in [place] or [place]; pregnant women with a Medicaid card; and children who have applied for Medicaid or [place] Health Choice, but were declined, whose parents can pay a reduced fee at time of service.  [place] Adult Dental Access PROGRAM  [place] [phone] Patients are seen by appointment only. Walk-ins are not accepted. [place] will see patients 42 years of age and older. Monday - Tuesday (8am-5pm) Most Wednesdays (8:30-5pm) $30 per visit, cash only  [facility] Adult Dental Access PROGRAM  [address], [facility] [phone] Patients are seen by appointment only. Walk-ins are not accepted. [place] will see patients 27 years of age and older. One Wednesday Evening (Monthly: Volunteer Based).  $30 per visit, cash only  [place]  [phone] for adults; Children under age 79, call Graduate Pediatric Dentistry at [phone]. Children aged 71-14, please call [phone] to request a pediatric application.  Dental services are provided in all areas of dental care including fillings, crowns and bridges, complete and partial dentures, implants, gum treatment, root canals, and extractions. Preventive care is also provided. Treatment is provided to both adults and children. Patients are selected via a lottery and there is often a waiting list.   [facility] [address] [place]  [phone]  [email or web address]   [facility] [address] [place], [place] [phone], Ext. [phone] Second and Fourth Thursday of each month, opens at 6:30 AM; Clinic ends at 9 AM.  Patients are seen on a first-come first-served basis, and a limited number are seen during each clinic.   [facility]  [address] Hillard Danker [place], [place] [phone]   Eligibility Requirements You must have lived in [place], [place], or [place] counties for at least the last three months.   You cannot be eligible for state  or Museum/gallery conservator, including [employer], [place], or [employer].   You generally cannot be eligible for healthcare insurance through your employer.    How to apply: Eligibility screenings are held every Tuesday and Wednesday afternoon from 1:00 pm until 4:00 pm. You do not need an appointment for the interview!  [facility] [address], [place], [place]   [place]  [place] Department  [place]  [phone]    Behavioral Health Resources in the Community: Intensive Outpatient Programs Organization         Address  Phone  Notes  [place]. [address], [place], [place] [phone]   [facility] Outpatient [address] [place], [employer]   ADS: Alcohol & Drug Svcs [address] [place], [place]   [place] [address],  [place], [place] or [phone]   Substance Abuse Resources Organization         Address  Phone  Notes  Alcohol and Drug Services  [phone]   [place]  [phone]   [place]   Chinita Pester  [phone]   Residential & Outpatient Substance Abuse Program  [phone]   Psychological Services Organization          Address  Phone  Notes  [facility] [place]  [place]  [phone]   [place] [address], [place] or [phone]    Mobile Crisis Teams Organization         Address  Phone  Notes  [facility], Mobile Crisis Care Unit  [phone]   Assertive Psychotherapeutic Services  [address] [place], [place]   [place] [address] [place] [phone]    Self-Help/Support Groups Organization         Address  Phone             Notes  [place]. of [place] - variety of support groups  [place] Call for more information  Narcotics Anonymous (NA), [facility] [address], [employer] [place]  2 meetings at this location   Special educational needs teacher         Address  Phone  Notes  ASAP Residential Treatment [place],    [place]  [phone]   [facility]  [address], [place] [phone], [place], [place]   [place], [place] [phone] Admissions: 8am-3pm M-F  Incentives Substance [place] [address]    [place], [place] [phone]   [facility] [address] Jadene Pierini [place], [place]   [facility] [address],  [place], [place]   Insight Programs - Intensive Outpatient [address], [place], [place]   [facility] ([place] [place]  [place], [place] [phone] or [phone]   Residential Treatment Services (RTS) [address], [place], [place] Accepts Medicaid  [facility] [place] [address],  [place] 1-8725099562 Substance Abuse/Addiction Treatment   [facility]) Organization         Address  Phone  Notes  [facility]  [phone]   Domenic Schwab, PhD [place], [address] [place], [place]   [phone]  [phone] or [phone]) [phone]   [facility]   [address] [place], [place] [phone]   [employer] [address], [place], [place] [phone] Insurance/Medicaid/sponsorship through [facility] and Families [address]                                    [place], [place] [phone] [place] [address]   [place], [place] [phone]    Dr. Adele Schilder  [phone]   [facility] Dept. 1) [address], [place] 2) [place] 3)  [place] [address], [place] [phone]  [phone]   [place] [phone] or [phone] (After Hours)

## 2014-01-15 NOTE — ED Provider Notes (Signed)
CSN: [id number]     Arrival date & time 01/12/14  1457 History   First MD Initiated Contact with Patient 01/12/14 1827     Chief Complaint  Patient presents with  . Headache  . Neck Pain   (Consider location/radiation/quality/duration/timing/severity/associated sxs/prior Treatment) HPI  58 year old female with headache. Gradual onset yesterday. Persistent since. Pain in the occipital region top of her head and radiating down to the left side of her neck. She denies acute trauma. No fevers or chills. No appreciable exacerbating relieving factors. She is tight taking ibuprofen with: Mild relief. No acute visual changes. No nausea or vomiting. No use of blood thinning medications. No acute numbness, tingling or loss of strength. No tenderness. No presyncopal symptoms.  Past Medical History  Diagnosis Date  . COPD (chronic obstructive pulmonary disease)    Past Surgical History  Procedure Laterality Date  . Abdominal hysterectomy    . Cholecystectomy     No family history on file. History  Substance Use Topics  . Smoking status: Current Every Day Smoker -- 1.00 packs/day    Types: Cigarettes  . Smokeless tobacco: Not on file  . Alcohol Use: No   OB History   Grav Para Term Preterm Abortions TAB SAB Ect Mult Living                 Review of Systems  All systems reviewed and negative, other than as noted in HPI.   Allergies  Review of patient's allergies indicates no known allergies.  Home Medications   Current Outpatient Rx  Name  Route  Sig  Dispense  Refill  . albuterol (PROVENTIL HFA;VENTOLIN HFA) 108 (90 BASE) MCG/ACT inhaler   Inhalation   Inhale 1 puff into the lungs every 6 (six) hours as needed for wheezing or shortness of breath.         . benzonatate (TESSALON) 100 MG capsule   Oral   Take 1 capsule (100 mg total) by mouth 3 (three) times daily as needed for cough.   15 capsule   0   . traMADol (ULTRAM) 50 MG tablet   Oral   Take 1 tablet (50 mg  total) by mouth every 6 (six) hours as needed for pain.   15 tablet   0   . traMADol (ULTRAM) 50 MG tablet   Oral   Take 1 tablet (50 mg total) by mouth every 6 (six) hours as needed.   5 tablet   0    BP 154/91  Pulse 65  Temp(Src) 98.3 F (36.8 C) (Oral)  Resp 20  SpO2 96% Physical Exam  Nursing note and vitals reviewed. Constitutional: She is oriented to person, place, and time. She appears well-developed and well-nourished. No distress.  HENT:  Head: Normocephalic and atraumatic.  Eyes: Conjunctivae and EOM are normal. Pupils are equal, round, and reactive to light. Right eye exhibits no discharge. Left eye exhibits no discharge.  Neck: Normal range of motion. Neck supple.  No nuchal rigidity  Cardiovascular: Normal rate, regular rhythm and normal heart sounds.  Exam reveals no gallop and no friction rub.   No murmur heard. Pulmonary/Chest: Effort normal and breath sounds normal. No respiratory distress.  Abdominal: Soft. She exhibits no distension. There is no tenderness.  Musculoskeletal: She exhibits no edema and no tenderness.  Neurological: She is alert and oriented to person, place, and time. No cranial nerve deficit. She exhibits normal muscle tone. Coordination normal.  Gait is steady  Skin: Skin is warm and  dry.  Psychiatric: She has a normal mood and affect. Her behavior is normal. Thought content normal.    ED Course  Procedures (including critical care time) Labs Review Labs Reviewed - No data to display Imaging Review No results found.  EKG Interpretation   None       MDM   1. Headache    57yF with HA. Suspect primary HA. Consider emergent secondary causes such as bleed, infectious or mass but doubt. There is no history of trauma. Pt has a nonfocal neurological exam. Afebrile and neck supple. No use of blood thinning medication. Consider ocular etiology such as acute angle closure glaucoma but doubt. Pt denies acute change in visual acuity and eye  exam unremarkable. Doubt temporal arteritis given age, no temporal tenderness and temporal artery pulsations palpable. Doubt CO poisoning. No contacts with similar symptoms. Doubt venous thrombosis. Doubt carotid or vertebral arteries dissection. Symptoms improved with meds. Feel that can be safely discharged, but strict return precautions discussed. Outpt fu.     Virgel Manifold, MD 01/15/14 [phone]

## 2014-04-26 ENCOUNTER — Emergency Department (HOSPITAL_COMMUNITY)
Admission: EM | Admit: 2014-04-26 | Discharge: 2014-04-26 | Disposition: A | Discharge status: Home / Self Care | Payer: Medicare Other | Attending: Emergency Medicine | Admitting: Emergency Medicine

## 2014-04-26 ENCOUNTER — Encounter (HOSPITAL_COMMUNITY): Payer: Self-pay | Admitting: Emergency Medicine

## 2014-04-26 DIAGNOSIS — F17200 Nicotine dependence, unspecified, uncomplicated: Secondary | ICD-10-CM | POA: Insufficient documentation

## 2014-04-26 DIAGNOSIS — IMO0002 Reserved for concepts with insufficient information to code with codable children: Secondary | ICD-10-CM | POA: Diagnosis not present

## 2014-04-26 DIAGNOSIS — S46909A Unspecified injury of unspecified muscle, fascia and tendon at shoulder and upper arm level, unspecified arm, initial encounter: Secondary | ICD-10-CM | POA: Insufficient documentation

## 2014-04-26 DIAGNOSIS — Y929 Unspecified place or not applicable: Secondary | ICD-10-CM | POA: Insufficient documentation

## 2014-04-26 DIAGNOSIS — Z79899 Other long term (current) drug therapy: Secondary | ICD-10-CM | POA: Insufficient documentation

## 2014-04-26 DIAGNOSIS — M545 Low back pain, unspecified: Secondary | ICD-10-CM | POA: Diagnosis not present

## 2014-04-26 DIAGNOSIS — X500XXA Overexertion from strenuous movement or load, initial encounter: Secondary | ICD-10-CM | POA: Insufficient documentation

## 2014-04-26 DIAGNOSIS — M549 Dorsalgia, unspecified: Secondary | ICD-10-CM

## 2014-04-26 DIAGNOSIS — J4489 Other specified chronic obstructive pulmonary disease: Secondary | ICD-10-CM | POA: Insufficient documentation

## 2014-04-26 DIAGNOSIS — S4980XA Other specified injuries of shoulder and upper arm, unspecified arm, initial encounter: Secondary | ICD-10-CM | POA: Diagnosis not present

## 2014-04-26 DIAGNOSIS — Y9389 Activity, other specified: Secondary | ICD-10-CM | POA: Insufficient documentation

## 2014-04-26 DIAGNOSIS — J449 Chronic obstructive pulmonary disease, unspecified: Secondary | ICD-10-CM | POA: Insufficient documentation

## 2014-04-26 MED ORDER — PREDNISONE 10 MG PO TABS: ORAL_TABLET | ORAL | Status: DC

## 2014-04-26 MED ORDER — BACLOFEN 10 MG PO TABS: 10.0000 mg | ORAL_TABLET | Freq: Three times a day (TID) | ORAL | Status: DC

## 2014-04-26 MED ORDER — PREDNISONE 50 MG PO TABS
60.0000 mg | ORAL_TABLET | Freq: Once | ORAL | Status: AC
  Administered 2014-04-26: 60 mg via ORAL
  Filled 2014-04-26 (×2): qty 1

## 2014-04-26 MED ORDER — ONDANSETRON HCL 4 MG PO TABS
4.0000 mg | ORAL_TABLET | Freq: Once | ORAL | Status: AC
  Administered 2014-04-26: 4 mg via ORAL
  Filled 2014-04-26: qty 1

## 2014-04-26 MED ORDER — MELOXICAM 7.5 MG PO TABS: ORAL_TABLET | ORAL | Status: DC

## 2014-04-26 MED ORDER — KETOROLAC TROMETHAMINE 10 MG PO TABS
10.0000 mg | ORAL_TABLET | Freq: Once | ORAL | Status: AC
Start: 2014-04-26 — End: 2014-04-26
  Administered 2014-04-26: 10 mg via ORAL
  Filled 2014-04-26: qty 1

## 2014-04-26 NOTE — ED Provider Notes (Signed)
CSN: [id number]     Arrival date & time 04/26/14  2245 History   None    Chief Complaint  Patient presents with  . Back Pain     (Consider location/radiation/quality/duration/timing/severity/associated sxs/prior Treatment) HPI Comments: Patient is a 58 year old female who presents to our department with a complaint of back pain. The patient states that 2 days ago she was lifting and pushing some heavy objects, and injury to the left lower back. Patient states that the pain radiates around to her flank area. The patient denies any high fever. She denies changes in urine or stool. She denies any recent operation or procedure. No nausea vomiting. Patient presents now for evaluation of her back pain.  Patient is a 58 y.o. female presenting with back pain. The history is provided by the patient.  Back Pain Associated symptoms: no abdominal pain, no chest pain and no dysuria     Past Medical History  Diagnosis Date  . COPD (chronic obstructive pulmonary disease)    Past Surgical History  Procedure Laterality Date  . Abdominal hysterectomy    . Cholecystectomy     No family history on file. History  Substance Use Topics  . Smoking status: Current Every Day Smoker -- 1.00 packs/day    Types: Cigarettes  . Smokeless tobacco: Not on file  . Alcohol Use: No   OB History   Grav Para Term Preterm Abortions TAB SAB Ect Mult Living                 Review of Systems  Constitutional: Negative for activity change.       All ROS Neg except as noted in HPI  HENT: Negative for nosebleeds.   Eyes: Negative for photophobia and discharge.  Respiratory: Negative for cough, shortness of breath and wheezing.   Cardiovascular: Negative for chest pain and palpitations.  Gastrointestinal: Negative for abdominal pain and blood in stool.  Genitourinary: Negative for dysuria, frequency and hematuria.  Musculoskeletal: Positive for back pain. Negative for arthralgias and neck pain.  Skin: Negative.    Neurological: Negative for dizziness, seizures and speech difficulty.  Psychiatric/Behavioral: Negative for hallucinations and confusion.      Allergies  Review of patient's allergies indicates no known allergies.  Home Medications   Prior to Admission medications   Medication Sig Start Date End Date Taking? Authorizing Provider  albuterol (PROVENTIL HFA;VENTOLIN HFA) 108 (90 BASE) MCG/ACT inhaler Inhale 1 puff into the lungs every 6 (six) hours as needed for wheezing or shortness of breath.    Historical Provider, MD  benzonatate (TESSALON) 100 MG capsule Take 1 capsule (100 mg total) by mouth 3 (three) times daily as needed for cough. 10/27/13   Alfonzo Feller, DO  traMADol (ULTRAM) 50 MG tablet Take 1 tablet (50 mg total) by mouth every 6 (six) hours as needed for pain. 10/20/13   Maudry Diego, MD  traMADol (ULTRAM) 50 MG tablet Take 1 tablet (50 mg total) by mouth every 6 (six) hours as needed. 01/12/14   Virgel Manifold, MD   BP 138/76  Pulse 81  Temp(Src) 98.2 F (36.8 C) (Oral)  Resp 20  Ht 5' (1.524 m)  Wt 135 lb (61.236 kg)  BMI 26.37 kg/m2  SpO2 98% Physical Exam  Nursing note and vitals reviewed. Constitutional: She is oriented to person, place, and time. She appears well-developed and well-nourished.  Non-toxic appearance.  HENT:  Head: Normocephalic.  Right Ear: Tympanic membrane and external ear normal.  Left Ear: Tympanic  membrane and external ear normal.  Eyes: EOM and lids are normal. Pupils are equal, round, and reactive to light.  Neck: Normal range of motion. Neck supple. Carotid bruit is not present.  Cardiovascular: Normal rate, regular rhythm, normal heart sounds, intact distal pulses and normal pulses.   Pulmonary/Chest: Breath sounds normal. No respiratory distress.  Abdominal: Soft. Bowel sounds are normal. There is no tenderness. There is no guarding.  Musculoskeletal: Normal range of motion. She exhibits no edema.       Thoracic back: She  exhibits tenderness. She exhibits no deformity.  Pain of the right lower shoulder extending to the right thoracic area with attempted ROM.  Lymphadenopathy:       Head (right side): No submandibular adenopathy present.       Head (left side): No submandibular adenopathy present.    She has no cervical adenopathy.  Neurological: She is alert and oriented to person, place, and time. She has normal strength. No cranial nerve deficit or sensory deficit.  Skin: Skin is warm and dry.  Psychiatric: She has a normal mood and affect. Her speech is normal.    ED Course  Procedures (including critical care time) Labs Review Labs Reviewed - No data to display  Imaging Review No results found.   EKG Interpretation None      MDM Pt has pain with ROM of the right shoulder. No neurovascular deficit. Suspect musculoskeletal issue. Rx for baclofen, mobic, and prednisone given to the patient. Pt to follow up with PCP next week.   Final diagnoses:  None    **I have reviewed nursing notes, vital signs, and all appropriate lab and imaging results for this patient.Lenox Ahr, PA-C 04/27/14 1722

## 2014-04-26 NOTE — Discharge Instructions (Signed)
Please rest your  Back Pain, Adult Back pain is very common. The pain often gets better over time. The cause of back pain is usually not dangerous. Most people can learn to manage their back pain on their own.  HOME CARE   Stay active. Start with short walks on flat ground if you can. Try to walk farther each day.  Do not sit, drive, or stand in one place for more than 30 minutes. Do not stay in bed.  Do not avoid exercise or work. Activity can help your back heal faster.  Be careful when you bend or lift an object. Bend at your knees, keep the object close to you, and do not twist.  Sleep on a firm mattress. Lie on your side, and bend your knees. If you lie on your back, put a pillow under your knees.  Only take medicines as told by your doctor.  Put ice on the injured area.  Put ice in a plastic bag.  Place a towel between your skin and the bag.  Leave the ice on for 15-20 minutes, 03-04 times a day for the first 2 to 3 days. After that, you can switch between ice and heat packs.  Ask your doctor about back exercises or massage.  Avoid feeling anxious or stressed. Find good ways to deal with stress, such as exercise. GET HELP RIGHT AWAY IF:   Your pain does not go away with rest or medicine.  Your pain does not go away in 1 week.  You have new problems.  You do not feel well.  The pain spreads into your legs.  You cannot control when you poop (bowel movement) or pee (urinate).  Your arms or legs feel weak or lose feeling (numbness).  You feel sick to your stomach (nauseous) or throw up (vomit).  You have belly (abdominal) pain.  You feel like you may pass out (faint). MAKE SURE YOU:   Understand these instructions.  Will watch your condition.  Will get help right away if you are not doing well or get worse. Document Released: 05/22/2008 Document Revised: 02/26/2012 Document Reviewed: 04/24/2011 [facility] Patient Information 2014 [place], [place].  back as  much as possible. Please use the medications as prescribed. Please see your primary physician next week for followup concerning her back pain.

## 2014-04-26 NOTE — ED Notes (Signed)
Patient c/o lower back pain x 2 days; states it hurts to lift her right arm.

## 2014-05-02 NOTE — ED Provider Notes (Signed)
Medical screening examination/treatment/procedure(s) were performed by non-physician practitioner and as supervising physician I was immediately available for consultation/collaboration.   EKG Interpretation None       Mervin Kung, MD 05/02/14 [phone]

## 2014-05-14 ENCOUNTER — Encounter (HOSPITAL_COMMUNITY): Payer: Self-pay | Admitting: Emergency Medicine

## 2014-05-14 ENCOUNTER — Emergency Department (HOSPITAL_COMMUNITY)
Admission: EM | Admit: 2014-05-14 | Discharge: 2014-05-14 | Disposition: A | Discharge status: Home / Self Care | Payer: Medicare Other | Attending: Emergency Medicine | Admitting: Emergency Medicine

## 2014-05-14 DIAGNOSIS — IMO0002 Reserved for concepts with insufficient information to code with codable children: Secondary | ICD-10-CM | POA: Diagnosis not present

## 2014-05-14 DIAGNOSIS — H6690 Otitis media, unspecified, unspecified ear: Secondary | ICD-10-CM | POA: Diagnosis not present

## 2014-05-14 DIAGNOSIS — Z791 Long term (current) use of non-steroidal anti-inflammatories (NSAID): Secondary | ICD-10-CM | POA: Insufficient documentation

## 2014-05-14 DIAGNOSIS — H65199 Other acute nonsuppurative otitis media, unspecified ear: Secondary | ICD-10-CM | POA: Diagnosis not present

## 2014-05-14 DIAGNOSIS — R51 Headache: Secondary | ICD-10-CM | POA: Insufficient documentation

## 2014-05-14 DIAGNOSIS — Z79899 Other long term (current) drug therapy: Secondary | ICD-10-CM | POA: Insufficient documentation

## 2014-05-14 DIAGNOSIS — F17200 Nicotine dependence, unspecified, uncomplicated: Secondary | ICD-10-CM | POA: Insufficient documentation

## 2014-05-14 DIAGNOSIS — J441 Chronic obstructive pulmonary disease with (acute) exacerbation: Secondary | ICD-10-CM | POA: Insufficient documentation

## 2014-05-14 DIAGNOSIS — H6692 Otitis media, unspecified, left ear: Secondary | ICD-10-CM

## 2014-05-14 MED ORDER — ANTIPYRINE-BENZOCAINE 5.4-1.4 % OT SOLN
3.0000 [drp] | Freq: Once | OTIC | Status: AC
  Administered 2014-05-14: 3 [drp] via OTIC
  Filled 2014-05-14: qty 10

## 2014-05-14 MED ORDER — IBUPROFEN 800 MG PO TABS
800.0000 mg | ORAL_TABLET | Freq: Once | ORAL | Status: AC
  Administered 2014-05-14: 800 mg via ORAL
  Filled 2014-05-14: qty 1

## 2014-05-14 MED ORDER — AMOXICILLIN 500 MG PO CAPS: 500.0000 mg | ORAL_CAPSULE | Freq: Three times a day (TID) | ORAL | Status: DC

## 2014-05-14 MED ORDER — ACETAMINOPHEN-CODEINE #3 300-30 MG PO TABS
1.0000 | ORAL_TABLET | Freq: Four times a day (QID) | ORAL | Status: DC | PRN
Start: 2014-05-14 — End: 2014-08-05

## 2014-05-14 MED ORDER — PENICILLIN V POTASSIUM 250 MG PO TABS
500.0000 mg | ORAL_TABLET | Freq: Once | ORAL | Status: AC
  Administered 2014-05-14: 500 mg via ORAL
  Filled 2014-05-14: qty 2

## 2014-05-14 NOTE — ED Provider Notes (Signed)
CSN: [id number]     Arrival date & time 05/14/14  1515 History   First MD Initiated Contact with Patient 05/14/14 1644     Chief Complaint  Patient presents with  . Otalgia    left ear     (Consider location/radiation/quality/duration/timing/severity/associated sxs/prior Treatment) Patient is a 58 y.o. female presenting with ear pain. The history is provided by the patient.  Otalgia Location:  Left Behind ear:  No abnormality Quality:  Aching and pressure Severity:  Moderate Onset quality:  Gradual Duration:  3 days Timing:  Intermittent Progression:  Worsening Chronicity:  New Context: not direct blow and no water in ear   Relieved by:  Nothing Ineffective treatments:  None tried Associated symptoms: cough and headaches   Associated symptoms: no abdominal pain, no fever and no neck pain   Risk factors: no recent travel and no prior ear surgery     Past Medical History  Diagnosis Date  . COPD (chronic obstructive pulmonary disease)    Past Surgical History  Procedure Laterality Date  . Abdominal hysterectomy    . Cholecystectomy     No family history on file. History  Substance Use Topics  . Smoking status: Current Every Day Smoker -- 1.00 packs/day    Types: Cigarettes  . Smokeless tobacco: Not on file  . Alcohol Use: No   OB History   Grav Para Term Preterm Abortions TAB SAB Ect Mult Living                 Review of Systems  Constitutional: Negative for fever and activity change.       All ROS Neg except as noted in HPI  HENT: Positive for ear pain. Negative for nosebleeds.   Eyes: Negative for photophobia and discharge.  Respiratory: Positive for cough and shortness of breath. Negative for wheezing.   Cardiovascular: Negative for chest pain and palpitations.  Gastrointestinal: Negative for abdominal pain and blood in stool.  Genitourinary: Negative for dysuria, frequency and hematuria.  Musculoskeletal: Negative for arthralgias, back pain and neck pain.   Skin: Negative.   Neurological: Positive for headaches. Negative for dizziness, seizures and speech difficulty.  Psychiatric/Behavioral: Negative for hallucinations and confusion.      Allergies  Review of patient's allergies indicates no known allergies.  Home Medications   Prior to Admission medications   Medication Sig Start Date End Date Taking? Authorizing Provider  albuterol (PROVENTIL HFA;VENTOLIN HFA) 108 (90 BASE) MCG/ACT inhaler Inhale 1 puff into the lungs every 6 (six) hours as needed for wheezing or shortness of breath.    Historical Provider, MD  baclofen (LIORESAL) 10 MG tablet Take 1 tablet (10 mg total) by mouth 3 (three) times daily. 04/26/14 05/26/14  Lenox Ahr, PA-C  benzonatate (TESSALON) 100 MG capsule Take 1 capsule (100 mg total) by mouth 3 (three) times daily as needed for cough. 10/27/13   Alfonzo Feller, DO  meloxicam (MOBIC) 7.5 MG tablet 1 po bid with food 04/26/14   Lenox Ahr, PA-C  predniSONE (DELTASONE) 10 MG tablet 9,3,7,1,6 - take with food 04/26/14   Lenox Ahr, PA-C  traMADol (ULTRAM) 50 MG tablet Take 1 tablet (50 mg total) by mouth every 6 (six) hours as needed for pain. 10/20/13   Maudry Diego, MD  traMADol (ULTRAM) 50 MG tablet Take 1 tablet (50 mg total) by mouth every 6 (six) hours as needed. 01/12/14   Virgel Manifold, MD   BP 137/77  Pulse 62  Temp(Src)  98.3 F (36.8 C) (Oral)  Ht 5\' 2"  (1.575 m)  Wt 145 lb (65.772 kg)  BMI 26.51 kg/m2  SpO2 98% Physical Exam  Nursing note and vitals reviewed. Constitutional: She is oriented to person, place, and time. She appears well-developed and well-nourished.  Non-toxic appearance.  HENT:  Head: Normocephalic.  Right Ear: Tympanic membrane and external ear normal.  Left Ear: Tympanic membrane and external ear normal.  The right external auditory canal is partially occluded with cerumen. A portion of the tympanic membrane seen is without problem. The left external auditory canal  reveals a cerumen impaction. The tympanic membrane cannot be visualized. There is no increased redness or tenderness of the mastoid area. No palpable lymphadenopathy in the preauricular or postauricular area.  Eyes: EOM and lids are normal. Pupils are equal, round, and reactive to light.  Neck: Normal range of motion. Neck supple. Carotid bruit is not present.  Cardiovascular: Normal rate, regular rhythm, normal heart sounds, intact distal pulses and normal pulses.   Pulmonary/Chest: Breath sounds normal. No respiratory distress.  Coarse breath sounds present. Symmetrical rise and fall of the chest.  Abdominal: Soft. Bowel sounds are normal. There is no tenderness. There is no guarding.  Musculoskeletal: Normal range of motion.  Lymphadenopathy:       Head (right side): No submandibular adenopathy present.       Head (left side): No submandibular adenopathy present.    She has no cervical adenopathy.  Neurological: She is alert and oriented to person, place, and time. She has normal strength. No cranial nerve deficit or sensory deficit.  Skin: Skin is warm and dry.  Psychiatric: She has a normal mood and affect. Her speech is normal.    ED Course  Procedures (including critical care time) Labs Review Labs Reviewed - No data to display  Imaging Review No results found.   EKG Interpretation None      MDM The left ear was irrigated by nursing staff. After irrigation the patient was noted to have an otitis media involving the left ear.   Patient treated with auralgan Tylenol codeine and Amoxil. Patient is to follow up with her primary physician for recheck.    Final diagnoses:  None    *I have reviewed nursing notes, vital signs, and all appropriate lab and imaging results for this patient.Lenox Ahr, PA-C 05/14/14 1728

## 2014-05-14 NOTE — Discharge Instructions (Signed)
Please use the 3 auralgan gtts to the left ear every 2 to 3 hours for discomfort. Use Amoxil 3 times daily with food, until all taken. May use Tylenol codeine for pain not controlled by Tylenol or ibuprofen. This medication may cause drowsiness, please use with caution. Otitis Media, Adult Otitis media is redness, soreness, and puffiness (swelling) in the space just behind your eardrum (middle ear). It may be caused by allergies or infection. It often happens along with a cold. HOME CARE  Take your medicine as told. Finish it even if you start to feel better.  Only take over-the-counter or prescription medicines for pain, discomfort, or fever as told by your doctor.  Follow up with your doctor as told. GET HELP IF:  You have otitis media only in one ear or bleeding from your nose or both.  You notice a lump on your neck.  You are not getting better in 3 5 days.  You feel worse instead of better. GET HELP RIGHT AWAY IF:   You have pain that is not helped with medicine.  You have puffiness, redness, or pain around your ear.  You get a stiff neck.  You cannot move part of your face (paralysis).  You notice that the bone behind your ear hurts when you touch it. MAKE SURE YOU:   Understand these instructions.  Will watch your condition.  Will get help right away if you are not doing well or get worse. Document Released: 05/22/2008 Document Revised: 08/06/2013 Document Reviewed: 07/01/2013 [facility] Patient Information 2014 [place], [place].

## 2014-05-14 NOTE — ED Provider Notes (Signed)
Medical screening examination/treatment/procedure(s) were performed by non-physician practitioner and as supervising physician I was immediately available for consultation/collaboration.   EKG Interpretation None        Orpah Greek, MD 05/14/14 [phone]

## 2014-08-05 ENCOUNTER — Encounter (HOSPITAL_COMMUNITY): Payer: Self-pay | Admitting: Emergency Medicine

## 2014-08-05 ENCOUNTER — Emergency Department (HOSPITAL_COMMUNITY)
Admission: EM | Admit: 2014-08-05 | Discharge: 2014-08-05 | Disposition: A | Discharge status: Home / Self Care | Payer: Medicare Other | Attending: Emergency Medicine | Admitting: Emergency Medicine

## 2014-08-05 DIAGNOSIS — W57XXXA Bitten or stung by nonvenomous insect and other nonvenomous arthropods, initial encounter: Secondary | ICD-10-CM | POA: Diagnosis not present

## 2014-08-05 DIAGNOSIS — Y939 Activity, unspecified: Secondary | ICD-10-CM | POA: Insufficient documentation

## 2014-08-05 DIAGNOSIS — M545 Low back pain, unspecified: Secondary | ICD-10-CM | POA: Diagnosis not present

## 2014-08-05 DIAGNOSIS — S30860A Insect bite (nonvenomous) of lower back and pelvis, initial encounter: Secondary | ICD-10-CM | POA: Insufficient documentation

## 2014-08-05 DIAGNOSIS — M549 Dorsalgia, unspecified: Secondary | ICD-10-CM | POA: Insufficient documentation

## 2014-08-05 DIAGNOSIS — F17200 Nicotine dependence, unspecified, uncomplicated: Secondary | ICD-10-CM | POA: Diagnosis not present

## 2014-08-05 DIAGNOSIS — Y929 Unspecified place or not applicable: Secondary | ICD-10-CM | POA: Diagnosis not present

## 2014-08-05 DIAGNOSIS — J4489 Other specified chronic obstructive pulmonary disease: Secondary | ICD-10-CM | POA: Diagnosis not present

## 2014-08-05 DIAGNOSIS — J449 Chronic obstructive pulmonary disease, unspecified: Secondary | ICD-10-CM | POA: Diagnosis not present

## 2014-08-05 MED ORDER — CYCLOBENZAPRINE HCL 10 MG PO TABS: 10.0000 mg | ORAL_TABLET | Freq: Two times a day (BID) | ORAL | Status: DC | PRN

## 2014-08-05 MED ORDER — NAPROXEN 500 MG PO TABS: 500.0000 mg | ORAL_TABLET | Freq: Two times a day (BID) | ORAL | Status: DC

## 2014-08-05 MED ORDER — DIPHENHYDRAMINE HCL 25 MG PO TABS: 25.0000 mg | ORAL_TABLET | Freq: Four times a day (QID) | ORAL | Status: DC

## 2014-08-05 MED ORDER — NAPROXEN 250 MG PO TABS
500.0000 mg | ORAL_TABLET | Freq: Once | ORAL | Status: AC
  Administered 2014-08-05: 500 mg via ORAL
  Filled 2014-08-05: qty 2

## 2014-08-05 NOTE — ED Notes (Signed)
Pt states back pain x 2 days. Also states rash to back from musquito bites. NAD.

## 2014-08-05 NOTE — Discharge Instructions (Signed)
Recommend rest for the back pain take the Flexeril and Naprosyn for the back pain as directed. Take the Benadryl for the itching of the bug bites. Return for any newer worse symptoms.

## 2014-08-05 NOTE — ED Provider Notes (Signed)
CSN: [id number]     Arrival date & time 08/05/14  1525 History  This chart was scribed for Fredia Sorrow, MD by Delphia Grates, ED Scribe. This patient was seen in room APA12/APA12 and the patient's care was started at 5:49 PM.    Chief Complaint  Patient presents with  . Back Pain      Patient is a 58 y.o. female presenting with back pain and rash. The history is provided by the patient. No language interpreter was used.  Back Pain Location:  Generalized Quality:  Burning Radiates to:  Does not radiate Pain severity:  Moderate Pain is:  Same all the time Duration:  2 days Timing:  Constant Progression:  Unchanged Chronicity:  Recurrent Relieved by:  Nothing Worsened by:  Nothing tried Ineffective treatments:  None tried Associated symptoms: no abdominal pain, no chest pain, no dysuria, no fever, no headaches, no numbness and no weakness   Rash Location:  Torso Torso rash location:  Lower back Quality: itchiness   Associated symptoms: no abdominal pain, no diarrhea, no fever, no headaches, no nausea, no shortness of breath, no sore throat and not vomiting     HPI Comments: Virginia Washington is a 58 y.o. female who presents to the Emergency Department complaining of rash on left side of back and generalized back pain for the past 2 days. Patient has history of back pain due to muscle strain in the past. Patient describes the pain as burning and nonradiating.There is associated redness and itching on left lower back from mosquito bites. She states the pain is worsened by lying down. Patient has not taken anytthing for itching or pain relief. She denies numbeness in bilateral feet or weakness to toes, abdominal pain, CP, dysuria, fever, and HA.Marland Kitchen Patient has history of COPD.  No PCP  Past Medical History  Diagnosis Date  . COPD (chronic obstructive pulmonary disease)    Past Surgical History  Procedure Laterality Date  . Abdominal hysterectomy    . Cholecystectomy      No family history on file. History  Substance Use Topics  . Smoking status: Current Every Day Smoker -- 1.00 packs/day    Types: Cigarettes  . Smokeless tobacco: Not on file  . Alcohol Use: No   OB History   Grav Para Term Preterm Abortions TAB SAB Ect Mult Living                 Review of Systems  Constitutional: Negative for fever and chills.  HENT: Negative for congestion, rhinorrhea and sore throat.   Eyes: Negative for visual disturbance.  Respiratory: Negative for cough and shortness of breath.   Cardiovascular: Negative for chest pain and leg swelling.  Gastrointestinal: Negative for nausea, vomiting, abdominal pain and diarrhea.  Genitourinary: Negative for dysuria and hematuria.  Musculoskeletal: Positive for back pain.  Skin: Positive for rash.  Neurological: Negative for weakness, numbness and headaches.  Hematological: Does not bruise/bleed easily.  Psychiatric/Behavioral: Negative for confusion.      Allergies  Review of patient's allergies indicates no known allergies.  Home Medications   Prior to Admission medications   Medication Sig Start Date End Date Taking? Authorizing Provider  cyclobenzaprine (FLEXERIL) 10 MG tablet Take 1 tablet (10 mg total) by mouth 2 (two) times daily as needed for muscle spasms. 08/05/14   Fredia Sorrow, MD  diphenhydrAMINE (BENADRYL) 25 MG tablet Take 1 tablet (25 mg total) by mouth every 6 (six) hours. 08/05/14   Fredia Sorrow, MD  naproxen (NAPROSYN) 500 MG tablet Take 1 tablet (500 mg total) by mouth 2 (two) times daily. 08/05/14   Fredia Sorrow, MD   Triage Vitals: BP 155/80  Pulse 70  Temp(Src) 98.7 F (37.1 C) (Oral)  Resp 16  SpO2 100%  Physical Exam  Nursing note and vitals reviewed. Constitutional: She is oriented to person, place, and time. She appears well-developed and well-nourished. No distress.  HENT:  Head: Normocephalic and atraumatic.  Eyes: Conjunctivae and EOM are normal.  Neck: Neck supple.  No tracheal deviation present.  Cardiovascular: Normal rate and regular rhythm.   Pulmonary/Chest: Effort normal and breath sounds normal. No respiratory distress. She has no wheezes.  Abdominal: Soft. Bowel sounds are normal. There is no tenderness.  Musculoskeletal: Normal range of motion. She exhibits tenderness. She exhibits no edema.  Tenderness over the right side of lower back. Some tenderness over the lumbar vertebrae on the left.   Neurological: She is alert and oriented to person, place, and time. No cranial nerve deficit.  Skin: Skin is warm and dry. Rash noted.  4 insect bites to left side of back. No secondary infection. One is approximately 1 cm and the others are 3 mm in size.  Psychiatric: She has a normal mood and affect. Her behavior is normal.    ED Course  Procedures (including critical care time)  DIAGNOSTIC STUDIES: Oxygen Saturation is 100% on room air, normal by my interpretation.    COORDINATION OF CARE: At 7517 Discussed treatment plan with patient which includes Benadryl. Patient agrees.   Labs Review Labs Reviewed - No data to display  Imaging Review No results found.   EKG Interpretation None      MDM   Final diagnoses:  Bilateral low back pain without sciatica  Insect bites    Patient with 2 day history of lumbar back pain bilateral no focal neuro deficits no evidence of sciatica. Also has some area of insect bites which patient stated her mosquito bites. No secondary infection. Is having some itching from that. We'll treat with Naprosyn and Flexeril for the back along with rest, and we'll treat the blood bites with Benadryl.   I personally performed the services described in this documentation, which was scribed in my presence. The recorded information has been reviewed and is accurate.     Fredia Sorrow, MD 08/05/14 [phone]

## 2014-12-12 ENCOUNTER — Encounter (HOSPITAL_COMMUNITY): Payer: Self-pay | Admitting: Emergency Medicine

## 2014-12-12 ENCOUNTER — Emergency Department (HOSPITAL_COMMUNITY): Payer: Medicare Other

## 2014-12-12 ENCOUNTER — Observation Stay (HOSPITAL_COMMUNITY)
Admission: EM | Admit: 2014-12-12 | Discharge: 2014-12-13 | Disposition: A | Discharge status: Home / Self Care | Payer: Medicare Other | Attending: Family Medicine | Admitting: Family Medicine

## 2014-12-12 DIAGNOSIS — R9431 Abnormal electrocardiogram [ECG] [EKG]: Secondary | ICD-10-CM | POA: Insufficient documentation

## 2014-12-12 DIAGNOSIS — J449 Chronic obstructive pulmonary disease, unspecified: Secondary | ICD-10-CM | POA: Diagnosis not present

## 2014-12-12 DIAGNOSIS — I499 Cardiac arrhythmia, unspecified: Secondary | ICD-10-CM | POA: Insufficient documentation

## 2014-12-12 DIAGNOSIS — R05 Cough: Secondary | ICD-10-CM | POA: Insufficient documentation

## 2014-12-12 DIAGNOSIS — Z90710 Acquired absence of both cervix and uterus: Secondary | ICD-10-CM | POA: Diagnosis not present

## 2014-12-12 DIAGNOSIS — Z23 Encounter for immunization: Secondary | ICD-10-CM | POA: Insufficient documentation

## 2014-12-12 DIAGNOSIS — R0789 Other chest pain: Secondary | ICD-10-CM | POA: Insufficient documentation

## 2014-12-12 DIAGNOSIS — R079 Chest pain, unspecified: Secondary | ICD-10-CM | POA: Diagnosis present

## 2014-12-12 DIAGNOSIS — I10 Essential (primary) hypertension: Secondary | ICD-10-CM | POA: Diagnosis not present

## 2014-12-12 DIAGNOSIS — Z9049 Acquired absence of other specified parts of digestive tract: Secondary | ICD-10-CM | POA: Insufficient documentation

## 2014-12-12 DIAGNOSIS — Z720 Tobacco use: Secondary | ICD-10-CM | POA: Diagnosis not present

## 2014-12-12 DIAGNOSIS — R61 Generalized hyperhidrosis: Secondary | ICD-10-CM | POA: Diagnosis not present

## 2014-12-12 DIAGNOSIS — R42 Dizziness and giddiness: Secondary | ICD-10-CM | POA: Diagnosis not present

## 2014-12-12 DIAGNOSIS — R110 Nausea: Secondary | ICD-10-CM | POA: Insufficient documentation

## 2014-12-12 LAB — COMPREHENSIVE METABOLIC PANEL
ALT: 26 U/L (ref 0–35)
ANION GAP: 6 (ref 5–15)
AST: 20 U/L (ref 0–37)
Albumin: 4.2 g/dL (ref 3.5–5.2)
Alkaline Phosphatase: 94 U/L (ref 39–117)
BUN: 9 mg/dL (ref 6–23)
CALCIUM: 9.2 mg/dL (ref 8.4–10.5)
CHLORIDE: 107 meq/L (ref 96–112)
CO2: 25 mmol/L (ref 19–32)
CREATININE: 0.87 mg/dL (ref 0.50–1.10)
GFR calc Af Amer: 85 mL/min — ABNORMAL LOW (ref >90)
GFR calc non Af Amer: 73 mL/min — ABNORMAL LOW (ref >90)
Glucose, Bld: 93 mg/dL (ref 70–99)
Potassium: 3.5 mmol/L (ref 3.5–5.1)
Sodium: 138 mmol/L (ref 135–145)
TOTAL PROTEIN: 7.5 g/dL (ref 6.0–8.3)
Total Bilirubin: 0.8 mg/dL (ref 0.3–1.2)

## 2014-12-12 LAB — CBC WITH DIFFERENTIAL/PLATELET
Basophils Absolute: 0.1 10*3/uL (ref 0.0–0.1)
Basophils Relative: 1 % (ref 0–1)
EOS ABS: 0.1 10*3/uL (ref 0.0–0.7)
Eosinophils Relative: 1 % (ref 0–5)
HEMATOCRIT: 43.3 % (ref 36.0–46.0)
HEMOGLOBIN: 13.8 g/dL (ref 12.0–15.0)
Lymphocytes Relative: 53 % — ABNORMAL HIGH (ref 12–46)
Lymphs Abs: 3.5 10*3/uL (ref 0.7–4.0)
MCH: 27.6 pg (ref 26.0–34.0)
MCHC: 31.9 g/dL (ref 30.0–36.0)
MCV: 86.6 fL (ref 78.0–100.0)
MONO ABS: 0.4 10*3/uL (ref 0.1–1.0)
MONOS PCT: 6 % (ref 3–12)
Neutro Abs: 2.6 10*3/uL (ref 1.7–7.7)
Neutrophils Relative %: 39 % — ABNORMAL LOW (ref 43–77)
Platelets: 225 10*3/uL (ref 150–400)
RBC: 5 MIL/uL (ref 3.87–5.11)
RDW: 14.4 % (ref 11.5–15.5)
WBC: 6.6 10*3/uL (ref 4.0–10.5)

## 2014-12-12 LAB — URINALYSIS, ROUTINE W REFLEX MICROSCOPIC
BILIRUBIN URINE: NEGATIVE
Glucose, UA: NEGATIVE mg/dL
Hgb urine dipstick: NEGATIVE
Ketones, ur: NEGATIVE mg/dL
LEUKOCYTES UA: NEGATIVE
NITRITE: NEGATIVE
PH: 7 (ref 5.0–8.0)
Protein, ur: NEGATIVE mg/dL
UROBILINOGEN UA: 0.2 mg/dL (ref 0.0–1.0)

## 2014-12-12 LAB — TROPONIN I: Troponin I: <0.03 ng/mL (ref <0.031)

## 2014-12-12 MED ORDER — INFLUENZA VAC SPLIT QUAD 0.5 ML IM SUSY
0.5000 mL | PREFILLED_SYRINGE | INTRAMUSCULAR | Status: AC
  Administered 2014-12-13: 0.5 mL via INTRAMUSCULAR
  Filled 2014-12-12: qty 0.5

## 2014-12-12 MED ORDER — ASPIRIN 81 MG PO CHEW
324.0000 mg | CHEWABLE_TABLET | Freq: Once | ORAL | Status: AC
  Administered 2014-12-12: 324 mg via ORAL
  Filled 2014-12-12: qty 4

## 2014-12-12 MED ORDER — SODIUM CHLORIDE 0.9 % IJ SOLN
3.0000 mL | Freq: Two times a day (BID) | INTRAMUSCULAR | Status: DC
  Administered 2014-12-12 – 2014-12-13 (×2): 3 mL via INTRAVENOUS

## 2014-12-12 MED ORDER — ACETAMINOPHEN 325 MG PO TABS
650.0000 mg | ORAL_TABLET | Freq: Four times a day (QID) | ORAL | Status: DC | PRN
  Administered 2014-12-12: 650 mg via ORAL
  Filled 2014-12-12: qty 2

## 2014-12-12 MED ORDER — DOCUSATE SODIUM 100 MG PO CAPS: 100.0000 mg | ORAL_CAPSULE | Freq: Every day | ORAL | Status: DC | PRN

## 2014-12-12 MED ORDER — ENOXAPARIN SODIUM 40 MG/0.4ML ~~LOC~~ SOLN
40.0000 mg | SUBCUTANEOUS | Status: DC
  Administered 2014-12-12: 40 mg via SUBCUTANEOUS
  Filled 2014-12-12: qty 0.4

## 2014-12-12 MED ORDER — ALUM & MAG HYDROXIDE-SIMETH 200-200-20 MG/5ML PO SUSP
30.0000 mL | Freq: Four times a day (QID) | ORAL | Status: DC | PRN
  Administered 2014-12-13: 30 mL via ORAL
  Filled 2014-12-12: qty 30

## 2014-12-12 MED ORDER — ACETAMINOPHEN 650 MG RE SUPP: 650.0000 mg | Freq: Four times a day (QID) | RECTAL | Status: DC | PRN

## 2014-12-12 MED ORDER — LISINOPRIL 10 MG PO TABS
10.0000 mg | ORAL_TABLET | Freq: Every day | ORAL | Status: DC
  Administered 2014-12-12 – 2014-12-13 (×2): 10 mg via ORAL
  Filled 2014-12-12 (×2): qty 1

## 2014-12-12 NOTE — H&P (Signed)
History and Physical  Virginia Washington EPP:[id number] DOB: Jan 17, 1956 DOA: 12/12/2014  Referring physician: Dr Wyvonnia Dusky, ED physician PCP: No PCP Per Patient   Chief Complaint: Chest pain  HPI: Virginia Washington is a 58 y.o. female  With COPD who presents with intermittent, nonradiating, sharp chest pain over the past 3 days. Patient denies pattern to the pain, which occurs both at rest and with activity. Pain radiates into her back.  No prior history of chest pain or cardiac symptoms. Denies previous myocardial infarction.  Cardiac risk factors: Tobacco abuse. Denies family history of early MI Denies medical history of diabetes  Review of Systems:   Pt complains of  decreased appetite, nausea.  Pt denies any  fevers, chills, vomiting, shortness of breath, dyspnea on exertion, palpitations, abdominal pain, diarrhea, constipation.  Review of systems are otherwise negative  Past Medical History  Diagnosis Date  . COPD (chronic obstructive pulmonary disease)    Past Surgical History  Procedure Laterality Date  . Abdominal hysterectomy    . Cholecystectomy     Social History:  reports that she has been smoking Cigarettes.  She has been smoking about 1.00 pack per day. She does not have any smokeless tobacco history on file. She reports that she does not drink alcohol or use illicit drugs. Patient lives at  home & is able to participate in activities of daily living  without assistance  No Known Allergies  No family history on file.    Prior to Admission medications   Medication Sig Start Date End Date Taking? Authorizing Provider  cyclobenzaprine (FLEXERIL) 10 MG tablet Take 1 tablet (10 mg total) by mouth 2 (two) times daily as needed for muscle spasms. Patient not taking: Reported on 12/12/2014 08/05/14   Fredia Sorrow, MD  diphenhydrAMINE (BENADRYL) 25 MG tablet Take 1 tablet (25 mg total) by mouth every 6 (six) hours. Patient not taking: Reported on 12/12/2014 08/05/14    Fredia Sorrow, MD  naproxen (NAPROSYN) 500 MG tablet Take 1 tablet (500 mg total) by mouth 2 (two) times daily. Patient not taking: Reported on 12/12/2014 08/05/14   Fredia Sorrow, MD    Physical Exam: BP 156/87 mmHg  Pulse 55  Temp(Src) 98.5 F (36.9 C) (Oral)  Resp 13  Ht 5\' 2"  (1.575 m)  Wt 58.968 kg (130 lb)  BMI 23.77 kg/m2  SpO2 99%  General:  Middle-aged female. Awake and alert and oriented x3. No acute cardiopulmonary distress.  Eyes: Pupils equal, round, reactive to light. Extraocular muscles are intact. Sclerae anicteric and noninjected.  ENT:  Moist mucosal membranes. No mucosal lesions.  Neck: Neck supple without lymphadenopathy. No carotid bruits. No masses palpated.  Cardiovascular: Regular rate with normal S1-S2 sounds. No murmurs, rubs, gallops auscultated. No JVD.  Respiratory: Good respiratory effort with no wheezes, rales, rhonchi. Lungs clear to auscultation bilaterally.  Abdomen: Soft, nontender, nondistended. Active bowel sounds. No masses or hepatosplenomegaly  Skin: Dry, warm to touch. 2+ dorsalis pedis and radial pulses. Musculoskeletal: No calf or leg pain. All major joints not erythematous nontender.  Psychiatric: Intact judgment and insight.  Neurologic: No focal neurological deficits. Cranial nerves II through XII are grossly intact.           Labs on Admission:  Basic Metabolic Panel:  Recent Labs Lab 12/12/14 1512  NA 138  K 3.5  CL 107  CO2 25  GLUCOSE 93  BUN 9  CREATININE 0.87  CALCIUM 9.2   Liver Function Tests:  Recent Labs Lab  12/12/14 1512  AST 20  ALT 26  ALKPHOS 94  BILITOT 0.8  PROT 7.5  ALBUMIN 4.2   No results for input(s): LIPASE, AMYLASE in the last 168 hours. No results for input(s): AMMONIA in the last 168 hours. CBC:  Recent Labs Lab 12/12/14 1512  WBC 6.6  NEUTROABS 2.6  HGB 13.8  HCT 43.3  MCV 86.6  PLT 225   Cardiac Enzymes:  Recent Labs Lab 12/12/14 1512  TROPONINI <0.03    BNP (last  3 results) No results for input(s): PROBNP in the last 8760 hours. CBG: No results for input(s): GLUCAP in the last 168 hours.  Radiological Exams on Admission: Dg Chest 2 View  12/12/2014   CLINICAL DATA:  58 year old female with mid chest pain. Dizziness. Cough. Current history of smoking. Initial encounter.  EXAM: CHEST  2 VIEW  COMPARISON:  10/27/2013 and earlier.  FINDINGS: Stable lung volumes. Mild cardiomegaly which appears increased. Evidence of left atrial enlargement on the lateral view. Other mediastinal contours are within normal limits. Visualized tracheal air column is within normal limits. No pneumothorax, pulmonary edema, pleural effusion or confluent pulmonary opacity. No acute osseous abnormality identified.  IMPRESSION: 1. Increased cardiac silhouette since 2014, now with evidence of left atrial enlargement. 2.  No acute cardiopulmonary abnormality.   Electronically Signed   By: Lars Pinks M.D.   On: 12/12/2014 15:57   Ct Head Wo Contrast  12/12/2014   CLINICAL DATA:  Chest pain, dizziness.  EXAM: CT HEAD WITHOUT CONTRAST  TECHNIQUE: Contiguous axial images were obtained from the base of the skull through the vertex without intravenous contrast.  COMPARISON:  MRI 01/13/2013  FINDINGS: No acute intracranial abnormality. Specifically, no hemorrhage, hydrocephalus, mass lesion, acute infarction, or significant intracranial injury. No acute calvarial abnormality. Visualized paranasal sinuses and mastoids clear. Orbital soft tissues unremarkable.  IMPRESSION: Negative.   Electronically Signed   By: Rolm Baptise M.D.   On: 12/12/2014 16:19    EKG: Independently reviewed. Normal sinus rhythm. Inverted T-wave in V1 with flat T-wave in V2. This is similar to previous EKGs.  Assessment/Plan Present on Admission:  . Chest pain . Hypertension   admit for observation on telemetry to rule out MI Serial troponins Start lisinopril  DVT prophylaxis: Lovenox  Consultants: None   Code  Status: Full code   Family Communication: none   Disposition Plan: home following observation and rule out   Time spent: 50 minutes  Loma Boston, DO [facility] Pager [phone]

## 2014-12-12 NOTE — ED Notes (Signed)
Pt reports intermittent dizziness, has steady gait. Pt also reports productive cough and associated intermittent chest pain.

## 2014-12-12 NOTE — ED Provider Notes (Signed)
CSN: [id number]     Arrival date & time 12/12/14  1126 History  This chart was scribed for Ezequiel Essex, MD by Stephania Fragmin, ED Scribe. This patient was seen in room APA03/APA03 and the patient's care was started at 3:04 PM.    Chief Complaint  Patient presents with  . Dizziness  . Cough   The history is provided by the patient. No language interpreter was used.     HPI Comments: Virginia Washington is a 58 y.o. female who presents to the Emergency Department complaining of intermittent episodes of sharp, center chest pain radiating to her back that last for 15 minutes and occurs 3 times a day, as well as intermittent vertigo that lasts a couple seconds at a time, onset over 2 days ago. Patient complains of associated loss of appetite, and feeling "hot and sweaty." She denies any modifying factors. This is a new problem. Patient has a history of cholecystectomy and hysterectomy. Sheis a smoker. Patient denies a history of medical problems, including cardiac problems or DM; she has never had a stress test. She denies taking regular medication. She denies SOB or headache. She denies any symptoms presently in the exam rooom. Patient has NKDA.  Past Medical History  Diagnosis Date  . COPD (chronic obstructive pulmonary disease)    Past Surgical History  Procedure Laterality Date  . Abdominal hysterectomy    . Cholecystectomy     No family history on file. History  Substance Use Topics  . Smoking status: Current Every Day Smoker -- 1.00 packs/day    Types: Cigarettes  . Smokeless tobacco: Not on file  . Alcohol Use: No   OB History    No data available     Review of Systems  A complete 10 system review of systems was obtained and all systems are negative except as noted in the HPI and PMH.    Allergies  Review of patient's allergies indicates no known allergies.  Home Medications   Prior to Admission medications   Medication Sig Start Date End Date Taking? Authorizing Provider   cyclobenzaprine (FLEXERIL) 10 MG tablet Take 1 tablet (10 mg total) by mouth 2 (two) times daily as needed for muscle spasms. Patient not taking: Reported on 12/12/2014 08/05/14   Fredia Sorrow, MD  diphenhydrAMINE (BENADRYL) 25 MG tablet Take 1 tablet (25 mg total) by mouth every 6 (six) hours. Patient not taking: Reported on 12/12/2014 08/05/14   Fredia Sorrow, MD  naproxen (NAPROSYN) 500 MG tablet Take 1 tablet (500 mg total) by mouth 2 (two) times daily. Patient not taking: Reported on 12/12/2014 08/05/14   Fredia Sorrow, MD   BP 139/120 mmHg  Pulse 62  Temp(Src) 98.5 F (36.9 C) (Oral)  Resp 19  Ht 5\' 2"  (1.575 m)  Wt 130 lb (58.968 kg)  BMI 23.77 kg/m2  SpO2 100% Physical Exam  Constitutional: She is oriented to person, place, and time. She appears well-developed and well-nourished. No distress.  Dry cough.  HENT:  Head: Normocephalic and atraumatic.  Mouth/Throat: Oropharynx is clear and moist. No oropharyngeal exudate.  Eyes: Conjunctivae and EOM are normal. Pupils are equal, round, and reactive to light.  Neck: Normal range of motion. Neck supple.  No meningismus.  Cardiovascular: Intact distal pulses.   No murmur heard. Irregular heart rhythm.  Pulmonary/Chest: Effort normal and breath sounds normal. No respiratory distress. She exhibits no tenderness.  Abdominal: Soft. There is no tenderness. There is no rebound and no guarding.  Musculoskeletal: Normal  range of motion. She exhibits no edema or tenderness.  Neurological: She is alert and oriented to person, place, and time. No cranial nerve deficit. She exhibits normal muscle tone. Coordination normal.  No ataxia on finger to nose bilaterally. No pronator drift. 5/5 strength throughout. CN 2-12 intact. Negative Romberg. Equal grip strength. Sensation intact. Gait not tested.  Skin: Skin is warm.  Psychiatric: She has a normal mood and affect. Her behavior is normal.  Nursing note and vitals reviewed.   ED Course   Procedures (including critical care time)  DIAGNOSTIC STUDIES: Oxygen Saturation is 100% on room air, normal by my interpretation.    COORDINATION OF CARE: 3:07 PM - Discussed treatment plan with pt at bedside which includes aspirin and CXR and pt agreed to plan.   Labs Review Labs Reviewed  CBC WITH DIFFERENTIAL - Abnormal; Notable for the following:    Neutrophils Relative % 39 (*)    Lymphocytes Relative 53 (*)    All other components within normal limits  COMPREHENSIVE METABOLIC PANEL - Abnormal; Notable for the following:    GFR calc non Af Amer 73 (*)    GFR calc Af Amer 85 (*)    All other components within normal limits  URINALYSIS, ROUTINE W REFLEX MICROSCOPIC - Abnormal; Notable for the following:    Specific Gravity, Urine <1.005 (*)    All other components within normal limits  TROPONIN I    Imaging Review Dg Chest 2 View  12/12/2014   CLINICAL DATA:  58 year old female with mid chest pain. Dizziness. Cough. Current history of smoking. Initial encounter.  EXAM: CHEST  2 VIEW  COMPARISON:  10/27/2013 and earlier.  FINDINGS: Stable lung volumes. Mild cardiomegaly which appears increased. Evidence of left atrial enlargement on the lateral view. Other mediastinal contours are within normal limits. Visualized tracheal air column is within normal limits. No pneumothorax, pulmonary edema, pleural effusion or confluent pulmonary opacity. No acute osseous abnormality identified.  IMPRESSION: 1. Increased cardiac silhouette since 2014, now with evidence of left atrial enlargement. 2.  No acute cardiopulmonary abnormality.   Electronically Signed   By: Lars Pinks M.D.   On: 12/12/2014 15:57   Ct Head Wo Contrast  12/12/2014   CLINICAL DATA:  Chest pain, dizziness.  EXAM: CT HEAD WITHOUT CONTRAST  TECHNIQUE: Contiguous axial images were obtained from the base of the skull through the vertex without intravenous contrast.  COMPARISON:  MRI 01/13/2013  FINDINGS: No acute intracranial  abnormality. Specifically, no hemorrhage, hydrocephalus, mass lesion, acute infarction, or significant intracranial injury. No acute calvarial abnormality. Visualized paranasal sinuses and mastoids clear. Orbital soft tissues unremarkable.  IMPRESSION: Negative.   Electronically Signed   By: Rolm Baptise M.D.   On: 12/12/2014 16:19     EKG Interpretation   Date/Time:  Saturday December 12 2014 15:14:03 EST Ventricular Rate:  56 PR Interval:  179 QRS Duration: 79 QT Interval:  427 QTC Calculation: 412 R Axis:   71 Text Interpretation:  Sinus rhythm Borderline T abnormalities, anterior  leads Confirmed by Wyvonnia Dusky  MD, Maeci Kalbfleisch [phone]) on 12/12/2014 3:17:40 PM      MDM   Final diagnoses:  Chest pain  Dizziness  EKG abnormality   Intermittent central chest pain for the past 3 days associated with dizziness and nausea and shortness of breath. No chest pain currently. History of COPD.  EKG with T-wave inversions anteriorly Troponin negative. Chest x-ray negative. No chest pain or shortness of breath in the ED.  Given risk  factors and EKG changes, we will admit for cardiac rule out. D/w Dr. Nehemiah Settle.  I personally performed the services described in this documentation, which was scribed in my presence. The recorded information has been reviewed and is accurate.   Ezequiel Essex, MD 12/13/14 [phone]

## 2014-12-12 NOTE — ED Notes (Signed)
Attempted report x1; nurse unable to take report and will call back.

## 2014-12-13 DIAGNOSIS — R05 Cough: Secondary | ICD-10-CM | POA: Diagnosis not present

## 2014-12-13 DIAGNOSIS — R072 Precordial pain: Secondary | ICD-10-CM

## 2014-12-13 DIAGNOSIS — I150 Renovascular hypertension: Secondary | ICD-10-CM

## 2014-12-13 DIAGNOSIS — R0789 Other chest pain: Secondary | ICD-10-CM | POA: Diagnosis not present

## 2014-12-13 DIAGNOSIS — R42 Dizziness and giddiness: Secondary | ICD-10-CM | POA: Diagnosis not present

## 2014-12-13 DIAGNOSIS — R9431 Abnormal electrocardiogram [ECG] [EKG]: Secondary | ICD-10-CM | POA: Diagnosis not present

## 2014-12-13 LAB — TROPONIN I
Troponin I: <0.03 ng/mL (ref <0.031)
Troponin I: <0.03 ng/mL (ref <0.031)
Troponin I: <0.03 ng/mL (ref <0.031)

## 2014-12-13 LAB — HEMOGLOBIN A1C
HEMOGLOBIN A1C: 6.1 % — AB (ref <5.7)
MEAN PLASMA GLUCOSE: 128 mg/dL — AB (ref <117)

## 2014-12-13 MED ORDER — METOPROLOL TARTRATE 12.5 MG HALF TABLET: 12.5000 mg | ORAL_TABLET | Freq: Two times a day (BID) | ORAL | Status: DC

## 2014-12-13 MED ORDER — LISINOPRIL 10 MG PO TABS: 10.0000 mg | ORAL_TABLET | Freq: Every day | ORAL | Status: DC

## 2014-12-13 NOTE — Progress Notes (Signed)
UR completed 

## 2014-12-13 NOTE — Discharge Summary (Signed)
Physician Discharge Summary  [place]ia Washington:[id number] DOB: 1956-05-22 DOA: 12/12/2014  PCP: No PCP Per Patient  Admit date: 12/12/2014 Discharge date: 12/13/2014  Time spent: 35  Recommendations for Outpatient Follow-up:  1. Follow-up with cardiology  Discharge Diagnoses:  Active Problems:   Chest pain   Hypertension   Discharge Condition: Stable  Diet recommendation: Heart healthy low-salt  Filed Weights   12/12/14 1130 12/12/14 1853  Weight: 58.968 kg (130 lb) 60.4 kg (133 lb 2.5 oz)    History of present illness:  This 58 year old female with history of COPD hypertension was admitted to the hospital 12/12/2014 with chest pain which is nonradiating sharp intermittent. She had no noted activity that causes and it was coming going lasting 5-10 minutes at a time when it occurred. She has not had any cardiac evaluation in the past and has history of smoking half pack per day She was recommended because of a heart score about 3-4 stay in the hospital for cardiology evaluation however it was a weekend and she elected not to do patient was started on lisinopril and this hospital stay as well as low-dose metoprolol 12.5 twice a day and recommended to follow up without delay as an outpatient with the cardiologist. Her EKG on discharge was normal sinus rhythm with mild sinus bradycardia her troponins were negative. I explained to her that if she is to have chest pain or other issues in the future she should present herself back to the emergency room. I have also explained to her that we are not liable if she has sudden cardiac death and she understands this  Discharge Exam: Filed Vitals:   12/13/14 1055  BP: 109/60  Pulse:   Temp:   Resp:     General: EOMI NCAT Cardiovascular: S1-S2 no murmur rub or gallop Respiratory: Clinically clear  Discharge Instructions   Discharge Instructions    Diet - low sodium heart healthy    Complete by:  As directed      Discharge  instructions    Complete by:  As directed   You have been seen in the hospital for chest pain and we do not know what the cause is. It was recommended TU2 get a stress test. You have elected to go home so I'm sending you home on various medications. I will give you number to call to arrange a stress test. Please do not delay this. Please quit smoking     Increase activity slowly    Complete by:  As directed           Current Discharge Medication List    START taking these medications   Details  lisinopril (PRINIVIL,ZESTRIL) 10 MG tablet Take 1 tablet (10 mg total) by mouth daily. Qty: 30 tablet, Refills: 0    metoprolol tartrate (LOPRESSOR) 12.5 mg TABS tablet Take 0.5 tablets (12.5 mg total) by mouth 2 (two) times daily. Qty: 60 tablet, Refills: 0      STOP taking these medications     cyclobenzaprine (FLEXERIL) 10 MG tablet      diphenhydrAMINE (BENADRYL) 25 MG tablet      naproxen (NAPROSYN) 500 MG tablet        No Known Allergies    The results of significant diagnostics from this hospitalization (including imaging, microbiology, ancillary and laboratory) are listed below for reference.    Significant Diagnostic Studies: Dg Chest 2 View  12/12/2014   CLINICAL DATA:  58 year old female with mid chest pain. Dizziness. Cough. Current history  of smoking. Initial encounter.  EXAM: CHEST  2 VIEW  COMPARISON:  10/27/2013 and earlier.  FINDINGS: Stable lung volumes. Mild cardiomegaly which appears increased. Evidence of left atrial enlargement on the lateral view. Other mediastinal contours are within normal limits. Visualized tracheal air column is within normal limits. No pneumothorax, pulmonary edema, pleural effusion or confluent pulmonary opacity. No acute osseous abnormality identified.  IMPRESSION: 1. Increased cardiac silhouette since 2014, now with evidence of left atrial enlargement. 2.  No acute cardiopulmonary abnormality.   Electronically Signed   By: Lars Pinks M.D.    On: 12/12/2014 15:57   Ct Head Wo Contrast  12/12/2014   CLINICAL DATA:  Chest pain, dizziness.  EXAM: CT HEAD WITHOUT CONTRAST  TECHNIQUE: Contiguous axial images were obtained from the base of the skull through the vertex without intravenous contrast.  COMPARISON:  MRI 01/13/2013  FINDINGS: No acute intracranial abnormality. Specifically, no hemorrhage, hydrocephalus, mass lesion, acute infarction, or significant intracranial injury. No acute calvarial abnormality. Visualized paranasal sinuses and mastoids clear. Orbital soft tissues unremarkable.  IMPRESSION: Negative.   Electronically Signed   By: Rolm Baptise M.D.   On: 12/12/2014 16:19    Microbiology: No results found for this or any previous visit (from the past 240 hour(s)).   Labs: Basic Metabolic Panel:  Recent Labs Lab 12/12/14 1512  NA 138  K 3.5  CL 107  CO2 25  GLUCOSE 93  BUN 9  CREATININE 0.87  CALCIUM 9.2   Liver Function Tests:  Recent Labs Lab 12/12/14 1512  AST 20  ALT 26  ALKPHOS 94  BILITOT 0.8  PROT 7.5  ALBUMIN 4.2   No results for input(s): LIPASE, AMYLASE in the last 168 hours. No results for input(s): AMMONIA in the last 168 hours. CBC:  Recent Labs Lab 12/12/14 1512  WBC 6.6  NEUTROABS 2.6  HGB 13.8  HCT 43.3  MCV 86.6  PLT 225   Cardiac Enzymes:  Recent Labs Lab 12/12/14 1512 12/13/14 0249 12/13/14 0824 12/13/14 1428  TROPONINI <0.03 <0.03 <0.03 <0.03   BNP: BNP (last 3 results) No results for input(s): PROBNP in the last 8760 hours. CBG: No results for input(s): GLUCAP in the last 168 hours.     SignedNita Sells  [facility] 12/13/2014, 4:08 PM

## 2014-12-13 NOTE — Progress Notes (Signed)
Patient was given discharge instructions, prescriptions, and care notes.  Patient verbalized understanding with no complaints or concerns voiced at this time.  IV was removed with catheter intact, no bleeding or complications.  Patient left unit in stable condition by staff member.

## 2014-12-17 ENCOUNTER — Encounter: Payer: Self-pay | Admitting: Internal Medicine

## 2014-12-17 ENCOUNTER — Ambulatory Visit (INDEPENDENT_AMBULATORY_CARE_PROVIDER_SITE_OTHER): Payer: Medicare Other | Admitting: Internal Medicine

## 2014-12-17 VITALS — BP 146/96 | HR 65 | Ht 62.0 in | Wt 133.8 lb

## 2014-12-17 DIAGNOSIS — E7800 Pure hypercholesterolemia, unspecified: Secondary | ICD-10-CM

## 2014-12-17 DIAGNOSIS — R079 Chest pain, unspecified: Secondary | ICD-10-CM | POA: Diagnosis not present

## 2014-12-17 NOTE — Patient Instructions (Addendum)
Your physician wants you to follow-up in: July or August with Dr.Ross You will receive a reminder letter in the mail two months in advance. If you don't receive a letter, please call our office to schedule the follow-up appointment.   START Enteric coated aspirin 81 mg daily   Please get FASTING lab work ( [facility])   Your physician has requested that you have a lexiscan myoview. For further information please visit [email or web address]. Please follow instruction sheet, as given.  I made you an apt to see Dr.Fanta on Wednesday, January 20 th at 9:30 am  His address is [address] in [place] phone number is [phone]  Thank you for choosing [facility] !

## 2014-12-17 NOTE — Progress Notes (Signed)
HPI Patinet is a 58 yo who was recently discharged from [facility]  Admitted for CP   Patinet says is was a sharp pain in chest   Comes and goes  Occurs with and without activity Not too active NOt pleuritic.  Not associated with food.   Since left hosp has not had any  Not too actvie Smokes about 1/2 ppd  Failed quitting attempts.   Didn't take BP meds today.    No Known Allergies  Current Outpatient Prescriptions  Medication Sig Dispense Refill  . lisinopril (PRINIVIL,ZESTRIL) 10 MG tablet Take 1 tablet (10 mg total) by mouth daily. 30 tablet 0  . metoprolol tartrate (LOPRESSOR) 12.5 mg TABS tablet Take 0.5 tablets (12.5 mg total) by mouth 2 (two) times daily. 60 tablet 0   No current facility-administered medications for this visit.    Past Medical History  Diagnosis Date  . COPD (chronic obstructive pulmonary disease)     Past Surgical History  Procedure Laterality Date  . Abdominal hysterectomy    . Cholecystectomy      No family history on file.  History   Social History  . Marital Status: Widowed    Spouse Name: N/A    Number of Children: N/A  . Years of Education: N/A   Occupational History  . [employer]    Social History Main Topics  . Smoking status: Current Every Day Smoker -- 1.00 packs/day    Types: Cigarettes  . Smokeless tobacco: Not on file  . Alcohol Use: No  . Drug Use: No  . Sexual Activity: No   Other Topics Concern  . Not on file   Social History Narrative    Review of Systems:  All systems reviewed.  They are negative to the above problem except as previously stated.  Vital Signs: BP 146/96 mmHg  Pulse 65  Ht 5\' 2"  (1.575 m)  Wt 133 lb 12.8 oz (60.691 kg)  BMI 24.47 kg/m2  SpO2 97%  Physical Exam Patinet is in NAD  Older than stated age.   HEENT:  Normocephalic, atraumatic. EOMI, PERRLA.  Neck: JVP is normal.  No bruits.  Lungs: clear to auscultation. No rales no wheezes.  Heart: Regular rate and rhythm. Normal S1, S2. No  S3.   No significant murmurs. PMI not displaced.  Abdomen:  Supple, nontender. Normal bowel sounds. No masses. No hepatomegaly.  Extremities:   Good distal pulses throughout. No lower extremity edema.  Musculoskeletal :moving all extremities.  Neuro:   alert and oriented x3.  CN II-XII grossly intact.  EKG  SB 56  (12/28) Assessment and Plan: 1.  CP  Not typical for ischemia but patient not too active.  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  WOuld recomm stress MIBI   Would recom ecASA 81 mg per day  2.  HTN  BP his high today  Did not take meds  Continue meds  3.  Lipids  WIll check fasting  With plaquing in aorta should be on statin  4.  Tob.  Continues to smoke  COunselled on risks and cessation.  Pt should have a primary carem MD  Will f/u in Aug, sooner as testing dictates

## 2014-12-18 HISTORY — PX: NM MYOVIEW LTD: HXRAD82

## 2014-12-21 DIAGNOSIS — E780 Pure hypercholesterolemia: Secondary | ICD-10-CM | POA: Diagnosis not present

## 2014-12-22 LAB — LIPID PANEL
Cholesterol: 215 mg/dL — ABNORMAL HIGH (ref 0–200)
HDL: 45 mg/dL (ref >39)
LDL CALC: 151 mg/dL — AB (ref 0–99)
Total CHOL/HDL Ratio: 4.8 Ratio
Triglycerides: 96 mg/dL (ref <150)
VLDL: 19 mg/dL (ref 0–40)

## 2014-12-23 ENCOUNTER — Encounter (HOSPITAL_COMMUNITY)
Admission: RE | Admit: 2014-12-23 | Discharge: 2014-12-23 | Disposition: A | Discharge status: Home / Self Care | Payer: Medicare Other | Source: Ambulatory Visit | Attending: Internal Medicine | Admitting: Internal Medicine

## 2014-12-23 ENCOUNTER — Telehealth: Payer: Self-pay | Admitting: *Deleted

## 2014-12-23 ENCOUNTER — Encounter (HOSPITAL_COMMUNITY): Payer: Self-pay

## 2014-12-23 ENCOUNTER — Ambulatory Visit (HOSPITAL_COMMUNITY)
Admission: RE | Admit: 2014-12-23 | Discharge: 2014-12-23 | Disposition: A | Discharge status: Home / Self Care | Payer: Medicare Other | Source: Ambulatory Visit | Attending: Internal Medicine | Admitting: Internal Medicine

## 2014-12-23 DIAGNOSIS — J449 Chronic obstructive pulmonary disease, unspecified: Secondary | ICD-10-CM | POA: Insufficient documentation

## 2014-12-23 DIAGNOSIS — I10 Essential (primary) hypertension: Secondary | ICD-10-CM | POA: Insufficient documentation

## 2014-12-23 DIAGNOSIS — R079 Chest pain, unspecified: Secondary | ICD-10-CM | POA: Insufficient documentation

## 2014-12-23 DIAGNOSIS — Z720 Tobacco use: Secondary | ICD-10-CM | POA: Diagnosis not present

## 2014-12-23 DIAGNOSIS — E785 Hyperlipidemia, unspecified: Secondary | ICD-10-CM

## 2014-12-23 HISTORY — DX: Essential (primary) hypertension: I10

## 2014-12-23 MED ORDER — TECHNETIUM TC 99M SESTAMIBI GENERIC - CARDIOLITE
10.0000 | Freq: Once | INTRAVENOUS | Status: AC | PRN
  Administered 2014-12-23: 10 via INTRAVENOUS

## 2014-12-23 MED ORDER — REGADENOSON 0.4 MG/5ML IV SOLN
0.4000 mg | Freq: Once | INTRAVENOUS | Status: AC | PRN
  Administered 2014-12-23: 0.4 mg via INTRAVENOUS

## 2014-12-23 MED ORDER — ATORVASTATIN CALCIUM 20 MG PO TABS: 20.0000 mg | ORAL_TABLET | Freq: Every day | ORAL | Status: DC

## 2014-12-23 MED ORDER — SODIUM CHLORIDE 0.9 % IJ SOLN
10.0000 mL | INTRAMUSCULAR | Status: DC | PRN
  Administered 2014-12-23: 10 mL via INTRAVENOUS
  Filled 2014-12-23: qty 10

## 2014-12-23 MED ORDER — SODIUM CHLORIDE 0.9 % IJ SOLN
INTRAMUSCULAR | Status: AC
  Administered 2014-12-23: 10 mL via INTRAVENOUS
  Filled 2014-12-23: qty 3

## 2014-12-23 MED ORDER — TECHNETIUM TC 99M SESTAMIBI - CARDIOLITE
30.0000 | Freq: Once | INTRAVENOUS | Status: AC | PRN
  Administered 2014-12-23: 11:00:00 30 via INTRAVENOUS

## 2014-12-23 MED ORDER — REGADENOSON 0.4 MG/5ML IV SOLN
INTRAVENOUS | Status: AC
  Administered 2014-12-23: 0.4 mg via INTRAVENOUS
  Filled 2014-12-23: qty 5

## 2014-12-23 NOTE — Progress Notes (Signed)
Stress Lab Nurses Notes - [place] 12/23/2014 Reason for doing test: Chest Pain Type of test: Wille Glaser Nurse performing test: Gerrit Halls, RN Nuclear Medicine Tech: Dyanne Carrel Echo Tech: Not Applicable MD performing test: Koneswaran/M.Bonnell Public PA Family MD: NPCP has appt with Dr. Legrand Rams Test explained and consent signed: Yes.   IV started: Saline lock flushed, No redness or edema and Saline lock started in radiology Symptoms: dizziness Treatment/Intervention: None Reason test stopped: protocol completed After recovery IV was: Discontinued via X-ray tech and No redness or edema Patient to return to Nuc. Med at : 11:45 Patient discharged: Home Patient's Condition upon discharge was: stable Comments: During test BP 151/99 & HR 112.  Recovery BP 131/96 & HR 93.  Symptoms resolved in recovery.  Geanie Cooley T

## 2014-12-23 NOTE — Telephone Encounter (Signed)
Pt daughter made aware, will start lipitor today. Mailed lab orders. Pt daughter verbalized understanding of medication and labs in 8 weeks.

## 2014-12-23 NOTE — Telephone Encounter (Signed)
-----   Message from Fay Records, MD sent at 12/22/2014 10:43 PM EST ----- LDL is 151  Scan showed she has cholesterol plaquing on aorta  LDL needs to be lower WOuld add lipitor 20 mg to regimen F/U lipid and AST in 8 wks.

## 2014-12-24 ENCOUNTER — Telehealth: Payer: Self-pay | Admitting: *Deleted

## 2014-12-24 NOTE — Telephone Encounter (Signed)
Pt made aware. Forwarded to Dr. Legrand Rams

## 2014-12-24 NOTE — Telephone Encounter (Signed)
-----   Message from Dorris Carnes V, MD sent at 12/23/2014  6:50 PM EST ----- Stress test is normal.  Symptoms do not appear to be due to blood flow problem to heart   Send to primary MD

## 2015-02-17 ENCOUNTER — Emergency Department (HOSPITAL_COMMUNITY)
Admission: EM | Admit: 2015-02-17 | Discharge: 2015-02-17 | Disposition: A | Discharge status: Home / Self Care | Payer: Medicare Other | Attending: Emergency Medicine | Admitting: Emergency Medicine

## 2015-02-17 ENCOUNTER — Encounter (HOSPITAL_COMMUNITY): Payer: Self-pay | Admitting: Emergency Medicine

## 2015-02-17 ENCOUNTER — Emergency Department (HOSPITAL_COMMUNITY): Payer: Medicare Other

## 2015-02-17 DIAGNOSIS — Z7982 Long term (current) use of aspirin: Secondary | ICD-10-CM | POA: Insufficient documentation

## 2015-02-17 DIAGNOSIS — I10 Essential (primary) hypertension: Secondary | ICD-10-CM | POA: Insufficient documentation

## 2015-02-17 DIAGNOSIS — J449 Chronic obstructive pulmonary disease, unspecified: Secondary | ICD-10-CM | POA: Insufficient documentation

## 2015-02-17 DIAGNOSIS — Z79899 Other long term (current) drug therapy: Secondary | ICD-10-CM | POA: Insufficient documentation

## 2015-02-17 DIAGNOSIS — M545 Low back pain, unspecified: Secondary | ICD-10-CM

## 2015-02-17 DIAGNOSIS — G8929 Other chronic pain: Secondary | ICD-10-CM | POA: Insufficient documentation

## 2015-02-17 DIAGNOSIS — Z720 Tobacco use: Secondary | ICD-10-CM | POA: Insufficient documentation

## 2015-02-17 DIAGNOSIS — R109 Unspecified abdominal pain: Secondary | ICD-10-CM | POA: Diagnosis not present

## 2015-02-17 DIAGNOSIS — Z9049 Acquired absence of other specified parts of digestive tract: Secondary | ICD-10-CM | POA: Diagnosis not present

## 2015-02-17 DIAGNOSIS — I700 Atherosclerosis of aorta: Secondary | ICD-10-CM | POA: Diagnosis not present

## 2015-02-17 DIAGNOSIS — M549 Dorsalgia, unspecified: Secondary | ICD-10-CM

## 2015-02-17 DIAGNOSIS — Z90710 Acquired absence of both cervix and uterus: Secondary | ICD-10-CM | POA: Diagnosis not present

## 2015-02-17 HISTORY — DX: Headache, unspecified: R51.9

## 2015-02-17 HISTORY — DX: Other chronic pain: G89.29

## 2015-02-17 HISTORY — DX: Headache: R51

## 2015-02-17 HISTORY — DX: Dorsalgia, unspecified: M54.9

## 2015-02-17 LAB — COMPREHENSIVE METABOLIC PANEL
ALBUMIN: 4 g/dL (ref 3.5–5.2)
ALK PHOS: 101 U/L (ref 39–117)
ALT: 12 U/L (ref 0–35)
AST: 13 U/L (ref 0–37)
Anion gap: 8 (ref 5–15)
BUN: 9 mg/dL (ref 6–23)
CO2: 26 mmol/L (ref 19–32)
Calcium: 8.9 mg/dL (ref 8.4–10.5)
Chloride: 107 mmol/L (ref 96–112)
Creatinine, Ser: 0.99 mg/dL (ref 0.50–1.10)
GFR calc Af Amer: 72 mL/min — ABNORMAL LOW (ref >90)
GFR calc non Af Amer: 63 mL/min — ABNORMAL LOW (ref >90)
Glucose, Bld: 106 mg/dL — ABNORMAL HIGH (ref 70–99)
POTASSIUM: 3.3 mmol/L — AB (ref 3.5–5.1)
Sodium: 141 mmol/L (ref 135–145)
Total Bilirubin: 0.4 mg/dL (ref 0.3–1.2)
Total Protein: 7.1 g/dL (ref 6.0–8.3)

## 2015-02-17 LAB — CBC WITH DIFFERENTIAL/PLATELET
Basophils Absolute: 0 10*3/uL (ref 0.0–0.1)
Basophils Relative: 0 % (ref 0–1)
EOS ABS: 0 10*3/uL (ref 0.0–0.7)
EOS PCT: 1 % (ref 0–5)
HEMATOCRIT: 41.1 % (ref 36.0–46.0)
Hemoglobin: 13 g/dL (ref 12.0–15.0)
LYMPHS ABS: 3.3 10*3/uL (ref 0.7–4.0)
LYMPHS PCT: 39 % (ref 12–46)
MCH: 27.6 pg (ref 26.0–34.0)
MCHC: 31.6 g/dL (ref 30.0–36.0)
MCV: 87.3 fL (ref 78.0–100.0)
MONO ABS: 0.7 10*3/uL (ref 0.1–1.0)
Monocytes Relative: 9 % (ref 3–12)
Neutro Abs: 4.4 10*3/uL (ref 1.7–7.7)
Neutrophils Relative %: 51 % (ref 43–77)
Platelets: 233 10*3/uL (ref 150–400)
RBC: 4.71 MIL/uL (ref 3.87–5.11)
RDW: 13.9 % (ref 11.5–15.5)
WBC: 8.5 10*3/uL (ref 4.0–10.5)

## 2015-02-17 LAB — LIPASE, BLOOD: Lipase: 34 U/L (ref 11–59)

## 2015-02-17 LAB — URINALYSIS, ROUTINE W REFLEX MICROSCOPIC
Glucose, UA: NEGATIVE mg/dL
Hgb urine dipstick: NEGATIVE
Ketones, ur: NEGATIVE mg/dL
Leukocytes, UA: NEGATIVE
NITRITE: NEGATIVE
Protein, ur: NEGATIVE mg/dL
Urobilinogen, UA: 0.2 mg/dL (ref 0.0–1.0)
pH: 5.5 (ref 5.0–8.0)

## 2015-02-17 MED ORDER — OXYCODONE-ACETAMINOPHEN 5-325 MG PO TABS
2.0000 | ORAL_TABLET | Freq: Once | ORAL | Status: AC
  Administered 2015-02-17: 2 via ORAL
  Filled 2015-02-17: qty 2

## 2015-02-17 MED ORDER — HYDROCODONE-ACETAMINOPHEN 5-325 MG PO TABS
ORAL_TABLET | ORAL | Status: DC
Start: 2015-02-17 — End: 2016-06-23

## 2015-02-17 MED ORDER — METHOCARBAMOL 500 MG PO TABS: 1000.0000 mg | ORAL_TABLET | Freq: Four times a day (QID) | ORAL | Status: DC | PRN

## 2015-02-17 NOTE — ED Notes (Signed)
Pt complaining back pain radiating around to upper abdominal area onset last night, denies other symptoms

## 2015-02-17 NOTE — Discharge Instructions (Signed)
°Emergency Department Resource Guide °1) Find a Doctor and Pay Out of Pocket °Although you won't have to find out who is covered by your insurance plan, it is a good idea to ask around and get recommendations. You will then need to call the office and see if the doctor you have chosen will accept you as a new patient and what types of options they offer for patients who are self-pay. Some doctors offer discounts or will set up payment plans for their patients who do not have insurance, but you will need to ask so you aren't surprised when you get to your appointment. ° °2) Contact Your Local Health Department °Not all health departments have doctors that can see patients for sick visits, but many do, so it is worth a call to see if yours does. If you don't know where your local health department is, you can check in your phone book. The CDC also has a tool to help you locate your state's health department, and many state websites also have listings of all of their local health departments. ° °3) Find a Walk-in Clinic °If your illness is not likely to be very severe or complicated, you may want to try a walk in clinic. These are popping up all over the country in pharmacies, drugstores, and shopping centers. They're usually staffed by nurse practitioners or physician assistants that have been trained to treat common illnesses and complaints. They're usually fairly quick and inexpensive. However, if you have serious medical issues or chronic medical problems, these are probably not your best option. ° °No Primary Care Doctor: °- Call [facility] Connect at  832-8000 - they can help you locate a primary care doctor that  accepts your insurance, provides certain services, etc. °- Physician Referral Service- 1-800-533-3463 ° °Chronic Pain Problems: °Organization         Address  Phone   Notes  °[facility]  (336) 297-2271 Patients need to be referred by their primary care doctor.  ° °Medication  Assistance: °Organization         Address  Phone   Notes  °[place] [facility] [address] °[place], [place] [address] (336) 641-8030 --Must be a resident of [place] °-- Must have NO insurance coverage whatsoever (no Medicaid/ Medicare, etc.) °-- The pt. MUST have a primary care doctor that directs their care regularly and follows them in the community °  °[facility]  (866) 331-1348   °[employer]  (888) 892-1162   ° °Agencies that provide inexpensive medical care: °Organization         Address  Phone   Notes  °[facility]  (336) 832-8035   ° Internal Medicine    (336) 832-7272   °[facility] [address] °[place], [place] [address] (336) 832-4777   °[facility] [address], °[place] (336) 271-4999   °[facility]    (336) 373-0678   °[facility]    (336) 272-1050   °[facility] ° [address], [place] Phone:  (336) 832-4444, Fax:  (336) 832-4440 Hours of Operation:  9 am - 6 pm, M-F.  Also accepts Medicaid/Medicare and self-pay.  °[facility] ° [address], [place] Phone: (336) 832-3150, Fax: (336) 832-3151. Hours of Operation:  8:30 am - 5:30 pm, M-F.  Also accepts Medicaid and self-pay.  °[facility] [place] [address], [place] Phone: (336) 878-6027   °[facility] [address], [place], [place] (336)723-1848, Ext. 123 Mondays & Thursdays: 7-9 AM.  First 15 patients are seen on a first come, first serve basis. °  ° °Medicaid-accepting [place] Providers: ° °Organization         Address  Phone   Notes  °[facility] [address], [place] (336) 641-2100 Also accepts self-pay patients.  °[facility] [address], [place] ° (336) 856-9996   °[facility] [address], [place]  (336) 288-8857   °[facility] [address] [place] [address], [place] (336) 299-7000   °Veita Bland [address], [place]  ° (336) 373-1557 Only accepts [place] Access Medicaid patients after they have their name applied to their card.  ° °Self-Pay (no insurance) in [place]: ° °Organization         Address  Phone   Notes  °Sickle Cell Patients, [facility] [address], [place] (336) 832-1970   °[facility] [address], [place] (336) 832-4400   °[facility] ° [address], [place] (336) 992-4800   °[facility]/Dr. Osei-Bonsu ° [address] [place] [address], [place] or [address], [place] (336) 841-8500 Phone number for both [place] and [place] locations is the same.  °[facility] [address], [place] (336) 299-0000   °[facility] [address] [place] [address], [place] or [address] (336) 852-7530 °(336) 878-2260   °[facility] [address], [place] (336) 350-1642, phone; (336) 294-5005, fax Sees patients 1st and 3rd Saturday of every month.  Must not qualify for public or private insurance (i.e. Medicaid, Medicare, [place] Health Choice, Veterans' Benefits) • Household income should be no more than 200% of the poverty level •The clinic cannot treat you if you are pregnant or think you are pregnant • Sexually transmitted diseases are not treated at the clinic.  ° ° °Dental Care: °Organization         Address  Phone  Notes  °[place] [facility] [address], [place] (336) 641-6152 Accepts children up to age 21 who are enrolled in Medicaid or [place] Health Choice; pregnant women with a Medicaid card; and children who have applied for Medicaid or [place] Health Choice, but were declined, whose parents can pay a reduced fee at time of service.  °[place]  [facility] [place]  [address], [place] (336) 641-7733 Accepts children up to age 21 who are enrolled in Medicaid or [place] Health Choice; pregnant women with a Medicaid card; and children who have applied for Medicaid or [place] Health Choice, but were declined, whose parents can pay a reduced fee at time of service.  °[facility] ° [address],  (336) 641-4533 Patients are seen by appointment only. Walk-ins are not accepted. [facility] will see patients 18 years of age and older. °Monday - Tuesday (8am-5pm) °Most Wednesdays (8:30-5pm) °$30 per visit, cash only  °[facility] ° [address], [place] (336) 641-4533 Patients are seen by appointment only. Walk-ins are not accepted. [facility] will see patients 18 years of age and older. °One   Wednesday Evening (Monthly: Volunteer Based).  $30 per visit, cash only  °[facility]  (919) 537-3737 for adults; Children under age 4, call Graduate Pediatric Dentistry at (919) [phone]. Children aged 4-14, please call (919) [phone] to request a pediatric application. ° Dental services are provided in all areas of dental care including fillings, crowns and bridges, complete and partial dentures, implants, gum treatment, root canals, and extractions. Preventive care is also provided. Treatment is provided to both adults and children. °Patients are selected via a lottery and there is often a waiting list. °  °[facility] [address], °[place] ° (336) 763-8833 [email or web address] °  °[facility] [address], [place], [place] (336)723-1848, Ext. 123 Second and Fourth Thursday of each month, opens at 6:30 AM; Clinic ends at 9 AM.  Patients are seen on a first-come first-served basis, and a limited number are seen during each clinic.  ° °[facility] ° [address], [place], [place] (336) 723-7904    Eligibility Requirements °You must have lived in [place], [place], or [place] counties for at least the last three months. °  You cannot be eligible for state or federal sponsored healthcare insurance, including Veterans Administration, Medicaid, or Medicare. °  You generally cannot be eligible for healthcare insurance through your employer.  °  How to apply: °Eligibility screenings are held every Tuesday and Wednesday afternoon from 1:00 pm until 4:00 pm. You do not need an appointment for the interview!  °[facility] [address], [place], [place] 336-631-2330   °[facility] [facility]  336-342-8273   °[place]  336-703-3100   °[facility]  336-570-6415   ° °Behavioral Health Resources in the Community: °Intensive Outpatient Programs °Organization         Address  Phone  Notes  °[facility] [address], [place], [place] 336-878-6098   °[facility] [address], [place], [place] 336-832-9800   °[facility] [address], [place], [place] ° 336-882-2125   °[facility] [address],  °[place], [place] 1-800-853-5163 or 336-641-4981   °Substance Abuse Resources °Organization         Address  Phone  Notes  °[facility]  336-882-2125   °[facility]  336-784-9470   °[facility]  336-285-9073   °[facility]  336-845-3988   °Residential & Outpatient Substance Abuse Program  1-800-659-3381   °Psychological Services °Organization         Address  Phone  Notes  ° Health  336- 832-9600   °[facility]  336- 378-7881   °[facility] [address], [place] 1-800-853-5163 or 336-641-4981   ° °Mobile Crisis Teams °Organization         Address  Phone  Notes  °[facility], Mobile Crisis Care Unit  1-877-626-1772   °Assertive °[facility] ° [address]  [place], [place] 336-834-9664   °Sharon DeEsch [address] °[place]  336-554-5454   ° °Self-Help/Support Groups °Organization         Address  Phone             Notes  °[facility]  - variety of support groups  336- 373-1402 Call for more information  °Narcotics Anonymous (NA), [facility] [address], °[place]   2 meetings at this location  ° °  Residential Treatment Programs °Organization         Address  Phone  Notes  °[facility] [address],    °[place]  1-866-801-8205   °[facility] ° [address], [place], [place] 704-293-8524   °[facility] [address], [place] 336-845-3988 Admissions: 8am-3pm M-F  °[facility] [address]    °[place], [place] 336-841-1104   °[facility] [address], [place], [place] 336-379-7146   °[facility] [address]  °[place], [place] 336-285-9073   °[facility] Intensive Outpatient [address], [place], [place] 336-852-3033   °[facility] [address]  °[place], [place] 1-877-615-2722 or 336-784-9470   °[facility] [address] [place], [place] 336-227-7417 Accepts Medicaid  °[facility] [address]  ° [place] 1-800-659-3381 Substance Abuse/Addiction Treatment  ° °[place] Behavioral Health Resources °Organization         Address  Phone  Notes  °[facility]  (888) 581-9988   °Julie Brannon, PhD [address] [place], [place]   (336) 349-5553 or (336) 951-0000   °[facility]   [address] °[place], [place] (336) 349-4454   °[facility] [address], [place], [place] (336) 342-8316 Insurance/Medicaid/sponsorship through [facility]  °[facility] [address]                                    [place], [place] (336) 342-8316 Therapy/tele-psych/case    °[facility] [address]  ° [place], [place] (336) 349-2233    °Dr. Arfeen  (336) 349-4544   °[facility] [place]  [employer] [place] [facility] 1) [address], [place] °2) [address], [place] °3)  [address], [place] (336) 349-3220 °(336) 342-7768 ° °(336) 342-8140   °[place] Child Abuse [facility] (336) 342-1394 or (336) 342-3537 (After Hours)    ° °Take the prescriptions as directed.  Apply moist heat or ice to the area(s) of discomfort, for 15 minutes at a time, several times per day for the next few days.  Do not fall asleep on a heating or ice pack.  Call your regular medical doctor tomorrow to schedule a follow up appointment in the next 2 days.  Return to the Emergency Department immediately if worsening. ° °

## 2015-02-17 NOTE — ED Notes (Signed)
Assumed care of discharge only.  Discharge instructions and prescriptions given and reviewed with patient.  Patient verbalized understanding of sedating effects of medications and to follow up with PMD as needed.  Patient is calling family to pick her up.  Patient discharged home in good condition via wheelchair.

## 2015-02-17 NOTE — ED Provider Notes (Signed)
CSN: [id number]     Arrival date & time 02/17/15  1911 History   First MD Initiated Contact with Patient 02/17/15 1948     Chief Complaint  Patient presents with  . Back Pain      HPI Pt was seen at [facility]. Per pt, c/o gradual onset and persistence of constant right sided low back "pain" for the past 2 days. Describes the pain as "sharp," with radiation into her upper abd.  Pain worsens with palpation of the area and body position changes. Denies incont/retention of bowel or bladder, no saddle anesthesia, no focal motor weakness, no tingling/numbness in extremities, no fevers, no rash, no injury, no N/V/D, no CP/SOB.    Past Medical History  Diagnosis Date  . COPD (chronic obstructive pulmonary disease)   . Hypertension   . Headache   . Chronic back pain   . Normal cardiac stress test 12/2014   Past Surgical History  Procedure Laterality Date  . Abdominal hysterectomy    . Cholecystectomy      History  Substance Use Topics  . Smoking status: Current Every Day Smoker -- 1.00 packs/day    Types: Cigarettes  . Smokeless tobacco: Not on file  . Alcohol Use: No    Review of Systems ROS: Statement: All systems negative except as marked or noted in the HPI; Constitutional: Negative for fever and chills. ; ; Eyes: Negative for eye pain, redness and discharge. ; ; ENMT: Negative for ear pain, hoarseness, nasal congestion, sinus pressure and sore throat. ; ; Cardiovascular: Negative for chest pain, palpitations, diaphoresis, dyspnea and peripheral edema. ; ; Respiratory: Negative for cough, wheezing and stridor. ; ; Gastrointestinal: Negative for nausea, vomiting, diarrhea, abdominal pain, blood in stool, hematemesis, jaundice and rectal bleeding. . ; ; Genitourinary: Negative for dysuria and hematuria. ; ; Musculoskeletal: +back pain. Negative for neck pain. Negative for swelling and trauma.; ; Skin: Negative for pruritus, rash, abrasions, blisters, bruising and skin lesion.; ; Neuro: Negative  for headache, lightheadedness and neck stiffness. Negative for weakness, altered level of consciousness , altered mental status, extremity weakness, paresthesias, involuntary movement, seizure and syncope.     Allergies  Review of patient's allergies indicates no known allergies.  Home Medications   Prior to Admission medications   Medication Sig Start Date End Date Taking? Authorizing Provider  aspirin EC 81 MG tablet Take 81 mg by mouth daily.    Historical Provider, MD  atorvastatin (LIPITOR) 20 MG tablet Take 1 tablet (20 mg total) by mouth daily. 12/23/14   Fay Records, MD  lisinopril (PRINIVIL,ZESTRIL) 10 MG tablet Take 1 tablet (10 mg total) by mouth daily. 12/13/14   Nita Sells, MD  metoprolol tartrate (LOPRESSOR) 12.5 mg TABS tablet Take 0.5 tablets (12.5 mg total) by mouth 2 (two) times daily. 12/13/14   Nita Sells, MD   BP 138/84 mmHg  Pulse 82  Temp(Src) 98.9 F (37.2 C) (Oral)  Resp 20  Ht 5\' 2"  (1.575 m)  Wt 129 lb 11.2 oz (58.832 kg)  BMI 23.72 kg/m2  SpO2 100% Physical Exam  2000; Physical examination:  Nursing notes reviewed; Vital signs and O2 SAT reviewed;  Constitutional: Well developed, Well nourished, Well hydrated, In no acute distress; Head:  Normocephalic, atraumatic; Eyes: EOMI, PERRL, No scleral icterus; ENMT: Mouth and pharynx normal, Mucous membranes moist; Neck: Supple, Full range of motion, No lymphadenopathy; Cardiovascular: Regular rate and rhythm, No gallop; Respiratory: Breath sounds clear & equal bilaterally, No wheezes.  Speaking full sentences  with ease, Normal respiratory effort/excursion; Chest: Nontender, Movement normal; Abdomen: Soft, Nontender, Nondistended, Normal bowel sounds; Genitourinary: No CVA tenderness; Spine:  No midline CS, TS, LS tenderness. +TTP right lumbar paraspinal muscles. No rash.;; Extremities: Pulses normal, No tenderness, No edema, No calf edema or asymmetry.; Neuro: AA&Ox3, Major CN grossly intact.  Speech  clear. No gross focal motor or sensory deficits in extremities. Strength 5/5 equal bilat UE's and LE's, including great toe dorsiflexion.  DTR 2/4 equal bilat UE's and LE's.  No gross sensory deficits.  Neg straight leg raises bilat. Climbs on and off stretcher easily by herself. Gait steady.; Skin: Color normal, Warm, Dry.   ED Course  Procedures     EKG Interpretation None      MDM  MDM Reviewed: previous chart, nursing note and vitals Reviewed previous: labs Interpretation: labs and CT scan      Results for orders placed or performed during the hospital encounter of 02/17/15  Urinalysis, Routine w reflex microscopic  Result Value Ref Range   Color, Urine YELLOW YELLOW   APPearance CLEAR CLEAR   Specific Gravity, Urine >1.030 (H) 1.005 - 1.030   pH 5.5 5.0 - 8.0   Glucose, UA NEGATIVE NEGATIVE mg/dL   Hgb urine dipstick NEGATIVE NEGATIVE   Bilirubin Urine SMALL (A) NEGATIVE   Ketones, ur NEGATIVE NEGATIVE mg/dL   Protein, ur NEGATIVE NEGATIVE mg/dL   Urobilinogen, UA 0.2 0.0 - 1.0 mg/dL   Nitrite NEGATIVE NEGATIVE   Leukocytes, UA NEGATIVE NEGATIVE  Comprehensive metabolic panel  Result Value Ref Range   Sodium 141 135 - 145 mmol/L   Potassium 3.3 (L) 3.5 - 5.1 mmol/L   Chloride 107 96 - 112 mmol/L   CO2 26 19 - 32 mmol/L   Glucose, Bld 106 (H) 70 - 99 mg/dL   BUN 9 6 - 23 mg/dL   Creatinine, Ser 0.99 0.50 - 1.10 mg/dL   Calcium 8.9 8.4 - 10.5 mg/dL   Total Protein 7.1 6.0 - 8.3 g/dL   Albumin 4.0 3.5 - 5.2 g/dL   AST 13 0 - 37 U/L   ALT 12 0 - 35 U/L   Alkaline Phosphatase 101 39 - 117 U/L   Total Bilirubin 0.4 0.3 - 1.2 mg/dL   GFR calc non Af Amer 63 (L) >90 mL/min   GFR calc Af Amer 72 (L) >90 mL/min   Anion gap 8 5 - 15  Lipase, blood  Result Value Ref Range   Lipase 34 11 - 59 U/L  CBC with Differential  Result Value Ref Range   WBC 8.5 4.0 - 10.5 K/uL   RBC 4.71 3.87 - 5.11 MIL/uL   Hemoglobin 13.0 12.0 - 15.0 g/dL   HCT 41.1 36.0 - 46.0 %    MCV 87.3 78.0 - 100.0 fL   MCH 27.6 26.0 - 34.0 pg   MCHC 31.6 30.0 - 36.0 g/dL   RDW 13.9 11.5 - 15.5 %   Platelets 233 150 - 400 K/uL   Neutrophils Relative % 51 43 - 77 %   Neutro Abs 4.4 1.7 - 7.7 K/uL   Lymphocytes Relative 39 12 - 46 %   Lymphs Abs 3.3 0.7 - 4.0 K/uL   Monocytes Relative 9 3 - 12 %   Monocytes Absolute 0.7 0.1 - 1.0 K/uL   Eosinophils Relative 1 0 - 5 %   Eosinophils Absolute 0.0 0.0 - 0.7 K/uL   Basophils Relative 0 0 - 1 %   Basophils Absolute 0.0  0.0 - 0.1 K/uL   Ct Renal Stone Study 02/17/2015   CLINICAL DATA:  Back pain radiating into the abdomen bilaterally. History of urinary tract stones.  EXAM: CT ABDOMEN AND PELVIS WITHOUT CONTRAST  TECHNIQUE: Multidetector CT imaging of the abdomen and pelvis was performed following the standard protocol without IV contrast.  COMPARISON:  CT abdomen and pelvis 10/20/2013 and 02/04/2012.  FINDINGS: Dependent atelectasis is seen in the lung bases. No pleural or pericardial effusion.  There is no hydronephrosis on the right or left. No renal or ureteral stones are identified. The kidneys have a normal uninfused appearance.  The patient is status post cholecystectomy. The liver, spleen, adrenal glands, pancreas and biliary tree appear normal. Extensive aortoiliac atherosclerosis without aneurysm is identified. The patient is status post hysterectomy. The stomach, small and large bowel and appendix appear normal. There is no lymphadenopathy or fluid. No focal bony abnormality.  IMPRESSION: Negative for urinary tract stone.  No acute finding.  Aortoiliac atherosclerosis without aneurysm.  Status post cholecystectomy and hysterectomy.   Electronically Signed   By: Inge Rise M.D.   On: 02/17/2015 20:41    2125:  Improved after meds and wants to go home now. Workup reassuring. Will continue to tx symptomatically at this time. Dx and testing d/w pt.  Questions answered.  Verb understanding, agreeable to d/c home with outpt  f/u.   Francine Graven, DO 02/19/15 2338

## 2015-02-26 ENCOUNTER — Emergency Department (HOSPITAL_COMMUNITY): Payer: Medicare Other

## 2015-02-26 ENCOUNTER — Encounter (HOSPITAL_COMMUNITY): Payer: Self-pay | Admitting: Emergency Medicine

## 2015-02-26 ENCOUNTER — Emergency Department (HOSPITAL_COMMUNITY)
Admission: EM | Admit: 2015-02-26 | Discharge: 2015-02-27 | Disposition: A | Discharge status: Home / Self Care | Payer: Medicare Other | Attending: Emergency Medicine | Admitting: Emergency Medicine

## 2015-02-26 DIAGNOSIS — J449 Chronic obstructive pulmonary disease, unspecified: Secondary | ICD-10-CM | POA: Diagnosis not present

## 2015-02-26 DIAGNOSIS — R1010 Upper abdominal pain, unspecified: Secondary | ICD-10-CM | POA: Diagnosis not present

## 2015-02-26 DIAGNOSIS — R1012 Left upper quadrant pain: Secondary | ICD-10-CM | POA: Diagnosis not present

## 2015-02-26 DIAGNOSIS — Z9089 Acquired absence of other organs: Secondary | ICD-10-CM | POA: Insufficient documentation

## 2015-02-26 DIAGNOSIS — G8929 Other chronic pain: Secondary | ICD-10-CM | POA: Insufficient documentation

## 2015-02-26 DIAGNOSIS — R112 Nausea with vomiting, unspecified: Secondary | ICD-10-CM | POA: Diagnosis not present

## 2015-02-26 DIAGNOSIS — Z79899 Other long term (current) drug therapy: Secondary | ICD-10-CM | POA: Diagnosis not present

## 2015-02-26 DIAGNOSIS — Z90710 Acquired absence of both cervix and uterus: Secondary | ICD-10-CM | POA: Insufficient documentation

## 2015-02-26 DIAGNOSIS — R1013 Epigastric pain: Secondary | ICD-10-CM | POA: Insufficient documentation

## 2015-02-26 DIAGNOSIS — Z720 Tobacco use: Secondary | ICD-10-CM | POA: Diagnosis not present

## 2015-02-26 DIAGNOSIS — I10 Essential (primary) hypertension: Secondary | ICD-10-CM | POA: Insufficient documentation

## 2015-02-26 DIAGNOSIS — K2970 Gastritis, unspecified, without bleeding: Secondary | ICD-10-CM | POA: Diagnosis not present

## 2015-02-26 DIAGNOSIS — R110 Nausea: Secondary | ICD-10-CM | POA: Diagnosis not present

## 2015-02-26 DIAGNOSIS — R109 Unspecified abdominal pain: Secondary | ICD-10-CM

## 2015-02-26 DIAGNOSIS — R197 Diarrhea, unspecified: Secondary | ICD-10-CM | POA: Diagnosis not present

## 2015-02-26 MED ORDER — SODIUM CHLORIDE 0.9 % IV SOLN
Freq: Once | INTRAVENOUS | Status: AC
  Administered 2015-02-26: via INTRAVENOUS

## 2015-02-26 MED ORDER — MORPHINE SULFATE 4 MG/ML IJ SOLN
4.0000 mg | Freq: Once | INTRAMUSCULAR | Status: AC
  Administered 2015-02-26: 4 mg via INTRAVENOUS
  Filled 2015-02-26: qty 1

## 2015-02-26 NOTE — ED Provider Notes (Signed)
CSN: [id number]     Arrival date & time 02/26/15  2328 History   First MD Initiated Contact with Patient 02/26/15 2330     Chief Complaint  Patient presents with  . Abdominal Pain     (Consider location/radiation/quality/duration/timing/severity/associated sxs/prior Treatment) Patient is a 59 y.o. female presenting with abdominal pain. The history is provided by the patient.  Abdominal Pain Pain location:  Epigastric, LUQ and RUQ Pain quality: cramping   Pain radiates to:  Does not radiate Pain severity:  Severe Onset quality:  Sudden Duration:  2 hours Timing:  Constant Progression:  Unchanged Chronicity:  New Context: previous surgery   Context: not alcohol use, not diet changes, not eating, not laxative use, not recent illness, not recent sexual activity, not recent travel, not retching and not sick contacts   Relieved by:  Nothing Worsened by:  Nothing tried Associated symptoms: nausea and vomiting   Associated symptoms: no belching, no chest pain, no constipation, no cough, no diarrhea and no fever   Nausea:    Severity:  Severe   Onset quality:  Sudden   Duration:  2 hours   Progression:  Improving Vomiting:    Quality:  Bilious material   Number of occurrences:  1   Severity:  Mild   Duration:  2 hours   Timing:  Unable to specify Risk factors: multiple surgeries   Risk factors: no alcohol abuse   Risk factors comment:  Hysterectomy, GB   Past Medical History  Diagnosis Date  . COPD (chronic obstructive pulmonary disease)   . Hypertension   . Headache   . Chronic back pain   . Normal cardiac stress test 12/2014   Past Surgical History  Procedure Laterality Date  . Abdominal hysterectomy    . Cholecystectomy     History reviewed. No pertinent family history. History  Substance Use Topics  . Smoking status: Current Every Day Smoker -- 1.00 packs/day    Types: Cigarettes  . Smokeless tobacco: Not on file  . Alcohol Use: No   OB History    No data  available     Review of Systems  Constitutional: Negative for fever.  Respiratory: Negative for cough.   Cardiovascular: Negative for chest pain.  Gastrointestinal: Positive for nausea, vomiting and abdominal pain. Negative for diarrhea and constipation.  All other systems reviewed and are negative.     Allergies  Review of patient's allergies indicates no known allergies.  Home Medications   Prior to Admission medications   Medication Sig Start Date End Date Taking? Authorizing Provider  atorvastatin (LIPITOR) 20 MG tablet Take 1 tablet (20 mg total) by mouth daily. 12/23/14   Fay Records, MD  HYDROcodone-acetaminophen (NORCO/VICODIN) 5-325 MG per tablet 1 or 2 tabs PO q6 hours prn pain 02/17/15   Francine Graven, DO  lisinopril (PRINIVIL,ZESTRIL) 10 MG tablet Take 1 tablet (10 mg total) by mouth daily. 12/13/14   Nita Sells, MD  methocarbamol (ROBAXIN) 500 MG tablet Take 2 tablets (1,000 mg total) by mouth 4 (four) times daily as needed for muscle spasms (muscle spasm/pain). 02/17/15   Francine Graven, DO  metoprolol tartrate (LOPRESSOR) 12.5 mg TABS tablet Take 0.5 tablets (12.5 mg total) by mouth 2 (two) times daily. 12/13/14   Nita Sells, MD  promethazine (PHENERGAN) 25 MG tablet Take 1 tablet (25 mg total) by mouth every 6 (six) hours as needed for nausea or vomiting. 02/27/15   Rolland Porter, MD   BP 132/89 mmHg  Pulse 60  Temp(Src) 98.8 F (37.1 C) (Oral)  Resp 16  Ht 5\' 2"  (1.575 m)  Wt 129 lb (58.514 kg)  BMI 23.59 kg/m2  SpO2 93% Physical Exam  Constitutional: She appears well-developed and well-nourished.  HENT:  Head: Normocephalic and atraumatic.  Eyes: Pupils are equal, round, and reactive to light.  Neck: Normal range of motion.  Cardiovascular: Normal rate and regular rhythm.   Pulmonary/Chest: Effort normal and breath sounds normal. No respiratory distress.  Abdominal: Soft. She exhibits no distension. There is no splenomegaly or hepatomegaly.  There is tenderness in the epigastric area and left upper quadrant. There is no rigidity, no rebound and no guarding.  Musculoskeletal: Normal range of motion.  Neurological: She is alert.  Skin: Skin is warm and dry.  Nursing note and vitals reviewed.   ED Course  Procedures (including critical care time) Labs Review Labs Reviewed  COMPREHENSIVE METABOLIC PANEL - Abnormal; Notable for the following:    Glucose, Bld 108 (*)    GFR calc non Af Amer 76 (*)    GFR calc Af Amer 88 (*)    All other components within normal limits  CBC WITH DIFFERENTIAL/PLATELET  LIPASE, BLOOD    Imaging Review No results found.   EKG Interpretation None      After IV Zofran and IV morphine.  Patient is not having any pain or nausea MDM   Final diagnoses:  Abdominal pain  Nausea and vomiting, vomiting of unspecified type         Junius Creamer, NP 03/01/15 1958  Rolland Porter, MD 03/09/15 [phone]

## 2015-02-26 NOTE — ED Notes (Signed)
abd pain onset 2 hours ago, vomited times one

## 2015-02-26 NOTE — ED Notes (Signed)
Abdominal pain x2hours. NV&D, Iv placed in field, NS infusing.

## 2015-02-27 DIAGNOSIS — R197 Diarrhea, unspecified: Secondary | ICD-10-CM | POA: Diagnosis not present

## 2015-02-27 DIAGNOSIS — R1012 Left upper quadrant pain: Secondary | ICD-10-CM | POA: Diagnosis not present

## 2015-02-27 DIAGNOSIS — R112 Nausea with vomiting, unspecified: Secondary | ICD-10-CM | POA: Diagnosis not present

## 2015-02-27 LAB — CBC WITH DIFFERENTIAL/PLATELET
Basophils Absolute: 0 10*3/uL (ref 0.0–0.1)
Basophils Relative: 0 % (ref 0–1)
EOS ABS: 0 10*3/uL (ref 0.0–0.7)
EOS PCT: 0 % (ref 0–5)
HCT: 42 % (ref 36.0–46.0)
HEMOGLOBIN: 13.3 g/dL (ref 12.0–15.0)
Lymphocytes Relative: 19 % (ref 12–46)
Lymphs Abs: 1.6 10*3/uL (ref 0.7–4.0)
MCH: 27.7 pg (ref 26.0–34.0)
MCHC: 31.7 g/dL (ref 30.0–36.0)
MCV: 87.5 fL (ref 78.0–100.0)
MONO ABS: 0.4 10*3/uL (ref 0.1–1.0)
MONOS PCT: 5 % (ref 3–12)
NEUTROS ABS: 6.2 10*3/uL (ref 1.7–7.7)
Neutrophils Relative %: 76 % (ref 43–77)
Platelets: 237 10*3/uL (ref 150–400)
RBC: 4.8 MIL/uL (ref 3.87–5.11)
RDW: 13.9 % (ref 11.5–15.5)
WBC: 8.2 10*3/uL (ref 4.0–10.5)

## 2015-02-27 LAB — COMPREHENSIVE METABOLIC PANEL
ALK PHOS: 99 U/L (ref 39–117)
ALT: 13 U/L (ref 0–35)
ANION GAP: 7 (ref 5–15)
AST: 14 U/L (ref 0–37)
Albumin: 3.8 g/dL (ref 3.5–5.2)
BILIRUBIN TOTAL: 0.5 mg/dL (ref 0.3–1.2)
BUN: 6 mg/dL (ref 6–23)
CO2: 27 mmol/L (ref 19–32)
Calcium: 8.9 mg/dL (ref 8.4–10.5)
Chloride: 109 mmol/L (ref 96–112)
Creatinine, Ser: 0.84 mg/dL (ref 0.50–1.10)
GFR calc Af Amer: 88 mL/min — ABNORMAL LOW (ref >90)
GFR calc non Af Amer: 76 mL/min — ABNORMAL LOW (ref >90)
Glucose, Bld: 108 mg/dL — ABNORMAL HIGH (ref 70–99)
Potassium: 3.7 mmol/L (ref 3.5–5.1)
SODIUM: 143 mmol/L (ref 135–145)
TOTAL PROTEIN: 6.6 g/dL (ref 6.0–8.3)

## 2015-02-27 LAB — LIPASE, BLOOD: LIPASE: 29 U/L (ref 11–59)

## 2015-02-27 MED ORDER — PROMETHAZINE HCL 25 MG PO TABS: 25.0000 mg | ORAL_TABLET | Freq: Four times a day (QID) | ORAL | Status: DC | PRN

## 2015-02-27 NOTE — ED Notes (Signed)
Attempted to reach pt daughter via telephone again with no response. Pt also attempted to reach pt with her cel phone. No answer at this time.

## 2015-02-27 NOTE — ED Notes (Signed)
Attempted to reach Pt Daughter, Darene Lamer, via contact numbers on chart. Voice mail left for return call to ED. Pt states that she would be the one to contact for her ride home.

## 2015-02-27 NOTE — ED Notes (Signed)
Discharge instructions given, pt demonstrated teach back and verbal understanding. No concerns voiced. Pt waiting in the waiting room for daughter to arrive to pick her up.

## 2015-02-27 NOTE — ED Provider Notes (Signed)
Patient presented to the ED with 2 hours of upper abdominal discomfort with nausea and vomiting.  When I rechecked the patient at around 2:30 she said she was still having nausea.  Recheck the patient again about 4 AM. She was feeling better. She was willing to try IV fluids.  At time of discharge patient states she's feeling better. She was able to drink IV fluids. She finally felt ready to be discharged.  Results for orders placed or performed during the hospital encounter of 02/26/15  CBC with Differential/Platelet  Result Value Ref Range   WBC 8.2 4.0 - 10.5 K/uL   RBC 4.80 3.87 - 5.11 MIL/uL   Hemoglobin 13.3 12.0 - 15.0 g/dL   HCT 42.0 36.0 - 46.0 %   MCV 87.5 78.0 - 100.0 fL   MCH 27.7 26.0 - 34.0 pg   MCHC 31.7 30.0 - 36.0 g/dL   RDW 13.9 11.5 - 15.5 %   Platelets 237 150 - 400 K/uL   Neutrophils Relative % 76 43 - 77 %   Neutro Abs 6.2 1.7 - 7.7 K/uL   Lymphocytes Relative 19 12 - 46 %   Lymphs Abs 1.6 0.7 - 4.0 K/uL   Monocytes Relative 5 3 - 12 %   Monocytes Absolute 0.4 0.1 - 1.0 K/uL   Eosinophils Relative 0 0 - 5 %   Eosinophils Absolute 0.0 0.0 - 0.7 K/uL   Basophils Relative 0 0 - 1 %   Basophils Absolute 0.0 0.0 - 0.1 K/uL  Comprehensive metabolic panel  Result Value Ref Range   Sodium 143 135 - 145 mmol/L   Potassium 3.7 3.5 - 5.1 mmol/L   Chloride 109 96 - 112 mmol/L   CO2 27 19 - 32 mmol/L   Glucose, Bld 108 (H) 70 - 99 mg/dL   BUN 6 6 - 23 mg/dL   Creatinine, Ser 0.84 0.50 - 1.10 mg/dL   Calcium 8.9 8.4 - 10.5 mg/dL   Total Protein 6.6 6.0 - 8.3 g/dL   Albumin 3.8 3.5 - 5.2 g/dL   AST 14 0 - 37 U/L   ALT 13 0 - 35 U/L   Alkaline Phosphatase 99 39 - 117 U/L   Total Bilirubin 0.5 0.3 - 1.2 mg/dL   GFR calc non Af Amer 76 (L) >90 mL/min   GFR calc Af Amer 88 (L) >90 mL/min   Anion gap 7 5 - 15  Lipase, blood  Result Value Ref Range   Lipase 29 11 - 59 U/L    Laboratory interpretation all normal    Dg Abd 2 Views  02/27/2015   CLINICAL DATA:   Mid right-sided abdominal pain. Nausea, vomiting, diarrhea for 3 hours.  EXAM: ABDOMEN - 2 VIEW  COMPARISON:  CT 9 days prior 02/17/2015  FINDINGS: The lung bases are clear. There is no free intra-abdominal air. No dilated bowel loops to suggest obstruction. Moderate volume of stool throughout the colon. Surgical clips in the right upper quadrant the abdomen from cholecystectomy. Surgical clips in the left pelvis again seen. No radiopaque calculi. No acute osseous abnormalities.  IMPRESSION: Normal bowel gas pattern.  No free air.   Electronically Signed   By: Jeb Levering M.D.   On: 02/27/2015 00:42   Ct Renal Stone Study  02/17/2015   CLINICAL DATA:  Back pain radiating into the abdomen bilaterally. History of urinary tract stones..  IMPRESSION: Negative for urinary tract stone.  No acute finding.  Aortoiliac atherosclerosis without aneurysm.  Status  post cholecystectomy and hysterectomy.   Electronically Signed   By: Inge Rise M.D.   On: 02/17/2015 20:41    Diagnoses that have been ruled out:  None  Diagnoses that are still under consideration:  None  Final diagnoses:  Abdominal pain  Nausea and vomiting, vomiting of unspecified type    New Prescriptions   PROMETHAZINE (PHENERGAN) 25 MG TABLET    Take 1 tablet (25 mg total) by mouth every 6 (six) hours as needed for nausea or vomiting.    Plan discharge  Rolland Porter, MD, Barbette Or, MD 02/27/15 [phone]

## 2015-02-27 NOTE — Discharge Instructions (Signed)
Drink plenty of fluids. Avoid fried, spicy, or greasy foods. Use Phenergan for nausea or vomiting. Recheck if you feel worse again.

## 2015-03-25 DIAGNOSIS — I10 Essential (primary) hypertension: Secondary | ICD-10-CM | POA: Diagnosis not present

## 2015-03-25 DIAGNOSIS — M1990 Unspecified osteoarthritis, unspecified site: Secondary | ICD-10-CM | POA: Diagnosis not present

## 2015-03-25 DIAGNOSIS — E785 Hyperlipidemia, unspecified: Secondary | ICD-10-CM | POA: Diagnosis not present

## 2015-03-25 DIAGNOSIS — F17200 Nicotine dependence, unspecified, uncomplicated: Secondary | ICD-10-CM | POA: Diagnosis not present

## 2015-03-30 ENCOUNTER — Other Ambulatory Visit (HOSPITAL_COMMUNITY): Payer: Self-pay | Admitting: Internal Medicine

## 2015-03-30 ENCOUNTER — Ambulatory Visit (HOSPITAL_COMMUNITY)
Admission: RE | Admit: 2015-03-30 | Discharge: 2015-03-30 | Disposition: A | Discharge status: Home / Self Care | Payer: Medicare Other | Source: Ambulatory Visit | Attending: Internal Medicine | Admitting: Internal Medicine

## 2015-03-30 DIAGNOSIS — M25562 Pain in left knee: Secondary | ICD-10-CM | POA: Diagnosis not present

## 2015-03-30 DIAGNOSIS — M1712 Unilateral primary osteoarthritis, left knee: Secondary | ICD-10-CM | POA: Diagnosis not present

## 2015-04-08 DIAGNOSIS — M1990 Unspecified osteoarthritis, unspecified site: Secondary | ICD-10-CM | POA: Diagnosis not present

## 2015-04-08 DIAGNOSIS — E785 Hyperlipidemia, unspecified: Secondary | ICD-10-CM | POA: Diagnosis not present

## 2015-04-08 DIAGNOSIS — I10 Essential (primary) hypertension: Secondary | ICD-10-CM | POA: Diagnosis not present

## 2015-06-09 ENCOUNTER — Encounter (HOSPITAL_COMMUNITY): Payer: Self-pay | Admitting: Emergency Medicine

## 2015-06-09 ENCOUNTER — Emergency Department (HOSPITAL_COMMUNITY)
Admission: EM | Admit: 2015-06-09 | Discharge: 2015-06-09 | Disposition: A | Discharge status: Home / Self Care | Payer: Medicare Other | Attending: Emergency Medicine | Admitting: Emergency Medicine

## 2015-06-09 DIAGNOSIS — Y929 Unspecified place or not applicable: Secondary | ICD-10-CM | POA: Diagnosis not present

## 2015-06-09 DIAGNOSIS — X58XXXA Exposure to other specified factors, initial encounter: Secondary | ICD-10-CM | POA: Diagnosis not present

## 2015-06-09 DIAGNOSIS — S3992XA Unspecified injury of lower back, initial encounter: Secondary | ICD-10-CM | POA: Diagnosis not present

## 2015-06-09 DIAGNOSIS — Y998 Other external cause status: Secondary | ICD-10-CM | POA: Diagnosis not present

## 2015-06-09 DIAGNOSIS — J441 Chronic obstructive pulmonary disease with (acute) exacerbation: Secondary | ICD-10-CM | POA: Insufficient documentation

## 2015-06-09 DIAGNOSIS — S0990XA Unspecified injury of head, initial encounter: Secondary | ICD-10-CM | POA: Diagnosis not present

## 2015-06-09 DIAGNOSIS — S46312A Strain of muscle, fascia and tendon of triceps, left arm, initial encounter: Secondary | ICD-10-CM | POA: Insufficient documentation

## 2015-06-09 DIAGNOSIS — I10 Essential (primary) hypertension: Secondary | ICD-10-CM | POA: Insufficient documentation

## 2015-06-09 DIAGNOSIS — S59902A Unspecified injury of left elbow, initial encounter: Secondary | ICD-10-CM | POA: Diagnosis present

## 2015-06-09 DIAGNOSIS — S46912A Strain of unspecified muscle, fascia and tendon at shoulder and upper arm level, left arm, initial encounter: Secondary | ICD-10-CM

## 2015-06-09 DIAGNOSIS — Z720 Tobacco use: Secondary | ICD-10-CM | POA: Insufficient documentation

## 2015-06-09 DIAGNOSIS — Z79899 Other long term (current) drug therapy: Secondary | ICD-10-CM | POA: Diagnosis not present

## 2015-06-09 DIAGNOSIS — S56911A Strain of unspecified muscles, fascia and tendons at forearm level, right arm, initial encounter: Secondary | ICD-10-CM | POA: Diagnosis not present

## 2015-06-09 DIAGNOSIS — Y9389 Activity, other specified: Secondary | ICD-10-CM | POA: Diagnosis not present

## 2015-06-09 DIAGNOSIS — G8929 Other chronic pain: Secondary | ICD-10-CM | POA: Diagnosis not present

## 2015-06-09 MED ORDER — KETOROLAC TROMETHAMINE 10 MG PO TABS
10.0000 mg | ORAL_TABLET | Freq: Once | ORAL | Status: AC
  Administered 2015-06-09: 10 mg via ORAL
  Filled 2015-06-09: qty 1

## 2015-06-09 MED ORDER — ACETAMINOPHEN 325 MG PO TABS
650.0000 mg | ORAL_TABLET | Freq: Once | ORAL | Status: AC
  Administered 2015-06-09: 650 mg via ORAL
  Filled 2015-06-09: qty 2

## 2015-06-09 MED ORDER — BACLOFEN 10 MG PO TABS: 10.0000 mg | ORAL_TABLET | Freq: Three times a day (TID) | ORAL | Status: AC

## 2015-06-09 MED ORDER — IBUPROFEN 600 MG PO TABS: 600.0000 mg | ORAL_TABLET | Freq: Four times a day (QID) | ORAL | Status: DC

## 2015-06-09 NOTE — ED Provider Notes (Signed)
CSN: [id number]     Arrival date & time 06/09/15  2122 History   First MD Initiated Contact with Patient 06/09/15 2135     Chief Complaint  Patient presents with  . Arm Pain     (Consider location/radiation/quality/duration/timing/severity/associated sxs/prior Treatment) Patient is a 59 y.o. female presenting with arm pain. The history is provided by the patient.  Arm Pain This is a new problem. The current episode started in the past 7 days. The problem occurs intermittently. The problem has been gradually worsening. Associated symptoms include arthralgias and headaches. Pertinent negatives include no joint swelling or numbness. The symptoms are aggravated by bending (bending and extending the left elbow). She has tried nothing for the symptoms. The treatment provided no relief.    Past Medical History  Diagnosis Date  . COPD (chronic obstructive pulmonary disease)   . Hypertension   . Headache   . Chronic back pain   . Normal cardiac stress test 12/2014   Past Surgical History  Procedure Laterality Date  . Abdominal hysterectomy    . Cholecystectomy     History reviewed. No pertinent family history. History  Substance Use Topics  . Smoking status: Current Every Day Smoker -- 1.00 packs/day    Types: Cigarettes  . Smokeless tobacco: Not on file  . Alcohol Use: No   OB History    No data available     Review of Systems  Respiratory: Positive for shortness of breath.   Musculoskeletal: Positive for back pain and arthralgias. Negative for joint swelling.  Neurological: Positive for headaches. Negative for numbness.  All other systems reviewed and are negative.     Allergies  Review of patient's allergies indicates no known allergies.  Home Medications   Prior to Admission medications   Medication Sig Start Date End Date Taking? Authorizing Provider  atorvastatin (LIPITOR) 20 MG tablet Take 1 tablet (20 mg total) by mouth daily. 12/23/14   Fay Records, MD   HYDROcodone-acetaminophen (NORCO/VICODIN) 5-325 MG per tablet 1 or 2 tabs PO q6 hours prn pain 02/17/15   Francine Graven, DO  lisinopril (PRINIVIL,ZESTRIL) 10 MG tablet Take 1 tablet (10 mg total) by mouth daily. 12/13/14   Nita Sells, MD  methocarbamol (ROBAXIN) 500 MG tablet Take 2 tablets (1,000 mg total) by mouth 4 (four) times daily as needed for muscle spasms (muscle spasm/pain). 02/17/15   Francine Graven, DO  metoprolol tartrate (LOPRESSOR) 12.5 mg TABS tablet Take 0.5 tablets (12.5 mg total) by mouth 2 (two) times daily. 12/13/14   Nita Sells, MD  promethazine (PHENERGAN) 25 MG tablet Take 1 tablet (25 mg total) by mouth every 6 (six) hours as needed for nausea or vomiting. 02/27/15   Rolland Porter, MD   BP 159/91 mmHg  Pulse 67  Temp(Src) 99 F (37.2 C)  Resp 18  Ht 5\' 2"  (1.575 m)  Wt 130 lb (58.968 kg)  BMI 23.77 kg/m2  SpO2 99% Physical Exam  Constitutional: She is oriented to person, place, and time. She appears well-developed and well-nourished.  Non-toxic appearance.  HENT:  Head: Normocephalic.  Right Ear: Tympanic membrane and external ear normal.  Left Ear: Tympanic membrane and external ear normal.  Eyes: EOM and lids are normal. Pupils are equal, round, and reactive to light.  Neck: Normal range of motion. Neck supple. Carotid bruit is not present.  Cardiovascular: Normal rate, regular rhythm, normal heart sounds, intact distal pulses and normal pulses.   Pulmonary/Chest: Breath sounds normal. No respiratory distress.  Abdominal:  Soft. Bowel sounds are normal. There is no tenderness. There is no guarding.  Musculoskeletal:       Left elbow: She exhibits decreased range of motion. She exhibits no swelling, no effusion and no deformity. Tenderness found.  There is tenderness from the olecranon process and medial epicondyles to the mid tricep area. No hot areas appreciated. No deformity of the upper extremity.  Lymphadenopathy:       Head (right side): No  submandibular adenopathy present.       Head (left side): No submandibular adenopathy present.    She has no cervical adenopathy.  Neurological: She is alert and oriented to person, place, and time. She has normal strength. No cranial nerve deficit or sensory deficit.  Skin: Skin is warm and dry.  Psychiatric: She has a normal mood and affect. Her speech is normal.  Nursing note and vitals reviewed.   ED Course  Procedures (including critical care time) Labs Review Labs Reviewed - No data to display  Imaging Review No results found.   EKG Interpretation None      MDM  Vital signs reviewed. Blood pressure is 159/91, patient has a history of hypertension. The examination suggests musculoskeletal strain involving the left upper extremity. The patient is fitted with a sling, and will be treated with Robaxin and ibuprofen 600 mg. Patient is to see Dr. Luna Glasgow for additional evaluation if not improving.    Final diagnoses:  None    *I have reviewed nursing notes, vital signs, and all appropriate lab and imaging results for this patient.[address] PA-C 06/09/15 [place], MD 06/10/15 [phone]

## 2015-06-09 NOTE — ED Notes (Signed)
Arm sling applied to pt's left arm.

## 2015-06-09 NOTE — ED Notes (Signed)
Pt c/o left arm pain at elbow since Saturday after moving a mattress.

## 2015-06-09 NOTE — Discharge Instructions (Signed)
Please use the sling for the next 5 days. Please use 600 mg of ibuprofen, and 500 mg of Tylenol every 6 hours as needed for pain. Please use baclofen 3 times daily for muscle strain. Please see Dr. Luna Glasgow, or the orthopedist of your choice for additional evaluation and management if not improving. Joint Sprain A sprain is a tear or stretch in the ligaments that hold a joint together. Severe sprains may need as long as 3-6 weeks of immobilization and/or exercises to heal completely. Sprained joints should be rested and protected. If not, they can become unstable and prone to re-injury. Proper treatment can reduce your pain, shorten the period of disability, and reduce the risk of repeated injuries. TREATMENT   Rest and elevate the injured joint to reduce pain and swelling.  Apply ice packs to the injury for 20-30 minutes every 2-3 hours for the next 2-3 days.  Keep the injury wrapped in a compression bandage or splint as long as the joint is painful or as instructed by your caregiver.  Do not use the injured joint until it is completely healed to prevent re-injury and chronic instability. Follow the instructions of your caregiver.  Long-term sprain management may require exercises and/or treatment by a physical therapist. Taping or special braces may help stabilize the joint until it is completely better. SEEK MEDICAL CARE IF:   You develop increased pain or swelling of the joint.  You develop increasing redness and warmth of the joint.  You develop a fever.  It becomes stiff.  Your hand or foot gets cold or numb. Document Released: 01/11/2005 Document Revised: 02/26/2012 Document Reviewed: 12/21/2008 [employer] Patient Information 2015 [place], [place]. This information is not intended to replace advice given to you by your health care provider. Make sure you discuss any questions you have with your health care provider.

## 2015-07-13 ENCOUNTER — Emergency Department (HOSPITAL_COMMUNITY)
Admission: EM | Admit: 2015-07-13 | Discharge: 2015-07-13 | Disposition: A | Discharge status: Home / Self Care | Payer: Medicare Other | Attending: Emergency Medicine | Admitting: Emergency Medicine

## 2015-07-13 ENCOUNTER — Encounter (HOSPITAL_COMMUNITY): Payer: Self-pay | Admitting: Emergency Medicine

## 2015-07-13 DIAGNOSIS — Z79899 Other long term (current) drug therapy: Secondary | ICD-10-CM | POA: Insufficient documentation

## 2015-07-13 DIAGNOSIS — I10 Essential (primary) hypertension: Secondary | ICD-10-CM | POA: Insufficient documentation

## 2015-07-13 DIAGNOSIS — G8929 Other chronic pain: Secondary | ICD-10-CM | POA: Insufficient documentation

## 2015-07-13 DIAGNOSIS — R21 Rash and other nonspecific skin eruption: Secondary | ICD-10-CM | POA: Diagnosis not present

## 2015-07-13 DIAGNOSIS — Z791 Long term (current) use of non-steroidal anti-inflammatories (NSAID): Secondary | ICD-10-CM | POA: Diagnosis not present

## 2015-07-13 DIAGNOSIS — Z720 Tobacco use: Secondary | ICD-10-CM | POA: Insufficient documentation

## 2015-07-13 DIAGNOSIS — J449 Chronic obstructive pulmonary disease, unspecified: Secondary | ICD-10-CM | POA: Diagnosis not present

## 2015-07-13 MED ORDER — PREDNISONE 50 MG PO TABS
60.0000 mg | ORAL_TABLET | Freq: Once | ORAL | Status: AC
  Administered 2015-07-13: 60 mg via ORAL
  Filled 2015-07-13 (×2): qty 1

## 2015-07-13 MED ORDER — DIPHENHYDRAMINE HCL 25 MG PO CAPS
50.0000 mg | ORAL_CAPSULE | Freq: Once | ORAL | Status: AC
  Administered 2015-07-13: 50 mg via ORAL
  Filled 2015-07-13: qty 2

## 2015-07-13 MED ORDER — PREDNISONE 20 MG PO TABS: 60.0000 mg | ORAL_TABLET | Freq: Every day | ORAL | Status: DC

## 2015-07-13 NOTE — ED Provider Notes (Signed)
TIME SEEN: 1:55 AM  CHIEF COMPLAINT: Rash  HPI: Pt is a 59 y.o. female with history of hypertension, COPD who presents to the emergency department with complaints of a pruritic rash that started last night. No fever, tick bite. No new lotions, soaps, detergents, medications. No rash involving her palms, soles or mucous membranes. Thinks that she may have been exposed to poison ivy or poison oak while working in the yard yesterday.  ROS: See HPI Constitutional: no fever  Eyes: no drainage  ENT: no runny nose   Cardiovascular:  no chest pain  Resp: no SOB  GI: no vomiting GU: no dysuria Integumentary:  rash  Allergy: no hives  Musculoskeletal: no leg swelling  Neurological: no slurred speech ROS otherwise negative  PAST MEDICAL HISTORY/PAST SURGICAL HISTORY:  Past Medical History  Diagnosis Date  . COPD (chronic obstructive pulmonary disease)   . Hypertension   . Headache   . Chronic back pain   . Normal cardiac stress test 12/2014    MEDICATIONS:  Prior to Admission medications   Medication Sig Start Date End Date Taking? Authorizing Provider  lisinopril (PRINIVIL,ZESTRIL) 10 MG tablet Take 1 tablet (10 mg total) by mouth daily. 12/13/14  Yes Nita Sells, MD  metoprolol tartrate (LOPRESSOR) 12.5 mg TABS tablet Take 0.5 tablets (12.5 mg total) by mouth 2 (two) times daily. 12/13/14  Yes Nita Sells, MD  atorvastatin (LIPITOR) 20 MG tablet Take 1 tablet (20 mg total) by mouth daily. 12/23/14   Fay Records, MD  HYDROcodone-acetaminophen (NORCO/VICODIN) 5-325 MG per tablet 1 or 2 tabs PO q6 hours prn pain 02/17/15   Francine Graven, DO  ibuprofen (ADVIL,MOTRIN) 600 MG tablet Take 1 tablet (600 mg total) by mouth 4 (four) times daily. 06/09/15   Lily Kocher, PA-C  promethazine (PHENERGAN) 25 MG tablet Take 1 tablet (25 mg total) by mouth every 6 (six) hours as needed for nausea or vomiting. 02/27/15   Rolland Porter, MD    ALLERGIES:  No Known Allergies  SOCIAL HISTORY:   History  Substance Use Topics  . Smoking status: Current Every Day Smoker -- 1.00 packs/day    Types: Cigarettes  . Smokeless tobacco: Not on file  . Alcohol Use: No    FAMILY HISTORY: No family history on file.  EXAM: BP 160/86 mmHg  Pulse 65  Temp(Src) 97.8 F (36.6 C) (Oral)  Resp 18  Ht 5\' 3"  (1.6 m)  Wt 135 lb (61.236 kg)  BMI 23.92 kg/m2  SpO2 98% CONSTITUTIONAL: Alert and oriented and responds appropriately to questions. Well-appearing; well-nourished HEAD: Normocephalic EYES: Conjunctivae clear, PERRL ENT: normal nose; no rhinorrhea; moist mucous membranes; pharynx without lesions noted NECK: Supple, no meningismus, no LAD  CARD: RRR; S1 and S2 appreciated; no murmurs, no clicks, no rubs, no gallops RESP: Normal chest excursion without splinting or tachypnea; breath sounds clear and equal bilaterally; no wheezes, no rhonchi, no rales, no hypoxia or respiratory distress, speaking full sentences ABD/GI: Normal bowel sounds; non-distended; soft, non-tender, no rebound, no guarding, no peritoneal signs BACK:  The back appears normal and is non-tender to palpation, there is no CVA tenderness EXT: Normal ROM in all joints; non-tender to palpation; no edema; normal capillary refill; no cyanosis, no calf tenderness or swelling    SKIN: Normal color for age and race; warm, small macular lesions noted on the back and right shoulder, no petechiae or purpura, no blisters or desquamation, no rash involving her palms, soles or mucous membranes NEURO: Moves all extremities  equally, sensation to light touch intact diffusely, cranial nerves II through XII intact PSYCH: The patient's mood and manner are appropriate. Grooming and personal hygiene are appropriate.  MEDICAL DECISION MAKING: Patient here with nonspecific rash. No sign of any life-threatening illness. Nontoxic appearing. We'll give Benadryl for itching and discharge on steroid burst. She is not a diabetic. Discussed return  precautions and supportive care instructions. She verbalized understanding and is comfortable with plan.       [name], DO 07/13/15 0320

## 2015-07-13 NOTE — ED Notes (Signed)
Patient was working in yard yesterday and possible was exposed to poison ivy or oak, itching all over body and head.

## 2015-07-13 NOTE — Discharge Instructions (Signed)
You may take Benadryl 50 mg every 8 hours as needed for itching. This medication is found over-the-counter. It can make you drowsy. Please do not drive a car, operate machinery when taking this medication or take with alcohol.   Rash A rash is a change in the color or texture of your skin. There are many different types of rashes. You may have other problems that accompany your rash. CAUSES   Infections.  Allergic reactions. This can include allergies to pets or foods.  Certain medicines.  Exposure to certain chemicals, soaps, or cosmetics.  Heat.  Exposure to poisonous plants.  Tumors, both cancerous and noncancerous. SYMPTOMS   Redness.  Scaly skin.  Itchy skin.  Dry or cracked skin.  Bumps.  Blisters.  Pain. DIAGNOSIS  Your caregiver may do a physical exam to determine what type of rash you have. A skin sample (biopsy) may be taken and examined under a microscope. TREATMENT  Treatment depends on the type of rash you have. Your caregiver may prescribe certain medicines. For serious conditions, you may need to see a skin doctor (dermatologist). HOME CARE INSTRUCTIONS   Avoid the substance that caused your rash.  Do not scratch your rash. This can cause infection.  You may take cool baths to help stop itching.  Only take over-the-counter or prescription medicines as directed by your caregiver.  Keep all follow-up appointments as directed by your caregiver. SEEK IMMEDIATE MEDICAL CARE IF:  You have increasing pain, swelling, or redness.  You have a fever.  You have new or severe symptoms.  You have body aches, diarrhea, or vomiting.  Your rash is not better after 3 days. MAKE SURE YOU:  Understand these instructions.  Will watch your condition.  Will get help right away if you are not doing well or get worse. Document Released: 11/24/2002 Document Revised: 02/26/2012 Document Reviewed: 09/18/2011 [facility] Patient Information 2015 [place], [place].  This information is not intended to replace advice given to you by your health care provider. Make sure you discuss any questions you have with your health care provider.

## 2015-09-19 ENCOUNTER — Emergency Department (HOSPITAL_COMMUNITY)
Admission: EM | Admit: 2015-09-19 | Discharge: 2015-09-19 | Disposition: A | Discharge status: Home / Self Care | Payer: Medicare Other | Attending: Emergency Medicine | Admitting: Emergency Medicine

## 2015-09-19 ENCOUNTER — Encounter (HOSPITAL_COMMUNITY): Payer: Self-pay | Admitting: Emergency Medicine

## 2015-09-19 ENCOUNTER — Emergency Department (HOSPITAL_COMMUNITY): Payer: Medicare Other

## 2015-09-19 DIAGNOSIS — K529 Noninfective gastroenteritis and colitis, unspecified: Secondary | ICD-10-CM

## 2015-09-19 DIAGNOSIS — Z720 Tobacco use: Secondary | ICD-10-CM | POA: Insufficient documentation

## 2015-09-19 DIAGNOSIS — Z7952 Long term (current) use of systemic steroids: Secondary | ICD-10-CM | POA: Diagnosis not present

## 2015-09-19 DIAGNOSIS — R109 Unspecified abdominal pain: Secondary | ICD-10-CM | POA: Diagnosis present

## 2015-09-19 DIAGNOSIS — G8929 Other chronic pain: Secondary | ICD-10-CM | POA: Diagnosis not present

## 2015-09-19 DIAGNOSIS — I10 Essential (primary) hypertension: Secondary | ICD-10-CM | POA: Diagnosis not present

## 2015-09-19 DIAGNOSIS — Z79899 Other long term (current) drug therapy: Secondary | ICD-10-CM | POA: Diagnosis not present

## 2015-09-19 DIAGNOSIS — Z791 Long term (current) use of non-steroidal anti-inflammatories (NSAID): Secondary | ICD-10-CM | POA: Insufficient documentation

## 2015-09-19 DIAGNOSIS — R140 Abdominal distension (gaseous): Secondary | ICD-10-CM | POA: Diagnosis not present

## 2015-09-19 DIAGNOSIS — R10819 Abdominal tenderness, unspecified site: Secondary | ICD-10-CM | POA: Diagnosis not present

## 2015-09-19 DIAGNOSIS — J449 Chronic obstructive pulmonary disease, unspecified: Secondary | ICD-10-CM | POA: Insufficient documentation

## 2015-09-19 DIAGNOSIS — R197 Diarrhea, unspecified: Secondary | ICD-10-CM | POA: Diagnosis not present

## 2015-09-19 DIAGNOSIS — R112 Nausea with vomiting, unspecified: Secondary | ICD-10-CM | POA: Diagnosis not present

## 2015-09-19 LAB — COMPREHENSIVE METABOLIC PANEL
ALK PHOS: 89 U/L (ref 38–126)
ALT: 15 U/L (ref 14–54)
ANION GAP: 5 (ref 5–15)
AST: 17 U/L (ref 15–41)
Albumin: 3.8 g/dL (ref 3.5–5.0)
BUN: 13 mg/dL (ref 6–20)
CO2: 26 mmol/L (ref 22–32)
Calcium: 9 mg/dL (ref 8.9–10.3)
Chloride: 109 mmol/L (ref 101–111)
Creatinine, Ser: 0.89 mg/dL (ref 0.44–1.00)
GFR calc non Af Amer: >60 mL/min (ref >60)
Glucose, Bld: 101 mg/dL — ABNORMAL HIGH (ref 65–99)
Potassium: 3.5 mmol/L (ref 3.5–5.1)
Sodium: 140 mmol/L (ref 135–145)
TOTAL PROTEIN: 7.1 g/dL (ref 6.5–8.1)
Total Bilirubin: 0.2 mg/dL — ABNORMAL LOW (ref 0.3–1.2)

## 2015-09-19 LAB — CBC
HCT: 41.5 % (ref 36.0–46.0)
HEMOGLOBIN: 13.5 g/dL (ref 12.0–15.0)
MCH: 28 pg (ref 26.0–34.0)
MCHC: 32.5 g/dL (ref 30.0–36.0)
MCV: 86.1 fL (ref 78.0–100.0)
PLATELETS: 219 10*3/uL (ref 150–400)
RBC: 4.82 MIL/uL (ref 3.87–5.11)
RDW: 14.6 % (ref 11.5–15.5)
WBC: 5.8 10*3/uL (ref 4.0–10.5)

## 2015-09-19 LAB — URINALYSIS, ROUTINE W REFLEX MICROSCOPIC
Bilirubin Urine: NEGATIVE
Glucose, UA: NEGATIVE mg/dL
HGB URINE DIPSTICK: NEGATIVE
KETONES UR: NEGATIVE mg/dL
LEUKOCYTES UA: NEGATIVE
Nitrite: NEGATIVE
PROTEIN: NEGATIVE mg/dL
Specific Gravity, Urine: 1.01 (ref 1.005–1.030)
Urobilinogen, UA: 0.2 mg/dL (ref 0.0–1.0)
pH: 7 (ref 5.0–8.0)

## 2015-09-19 LAB — LIPASE, BLOOD: Lipase: 43 U/L (ref 22–51)

## 2015-09-19 MED ORDER — IOHEXOL 300 MG/ML  SOLN
25.0000 mL | Freq: Once | INTRAMUSCULAR | Status: AC | PRN
  Administered 2015-09-19: 25 mL via ORAL

## 2015-09-19 MED ORDER — IOHEXOL 300 MG/ML  SOLN
100.0000 mL | Freq: Once | INTRAMUSCULAR | Status: AC | PRN
  Administered 2015-09-19: 100 mL via INTRAVENOUS

## 2015-09-19 MED ORDER — ONDANSETRON 4 MG PO TBDP: ORAL_TABLET | ORAL | Status: DC

## 2015-09-19 MED ORDER — SODIUM CHLORIDE 0.9 % IJ SOLN
INTRAMUSCULAR | Status: AC
  Filled 2015-09-19: qty 45

## 2015-09-19 MED ORDER — SODIUM CHLORIDE 0.9 % IJ SOLN
INTRAMUSCULAR | Status: AC
  Filled 2015-09-19: qty 500

## 2015-09-19 MED ORDER — SODIUM CHLORIDE 0.9 % IV BOLUS (SEPSIS)
1000.0000 mL | Freq: Once | INTRAVENOUS | Status: AC
Start: 2015-09-19 — End: 2015-09-19
  Administered 2015-09-19: 1000 mL via INTRAVENOUS

## 2015-09-19 MED ORDER — ONDANSETRON HCL 4 MG/2ML IJ SOLN
4.0000 mg | Freq: Once | INTRAMUSCULAR | Status: AC
  Administered 2015-09-19: 4 mg via INTRAVENOUS
  Filled 2015-09-19: qty 2

## 2015-09-19 MED ORDER — HYDROMORPHONE HCL 1 MG/ML IJ SOLN
1.0000 mg | Freq: Once | INTRAMUSCULAR | Status: AC
  Administered 2015-09-19: 1 mg via INTRAVENOUS
  Filled 2015-09-19: qty 1

## 2015-09-19 MED ORDER — DICYCLOMINE HCL 20 MG PO TABS: ORAL_TABLET | ORAL | Status: DC

## 2015-09-19 NOTE — Discharge Instructions (Signed)
Drink plenty of  Fluids.  Take imodium for diarrhea and follow up with your md this week.

## 2015-09-19 NOTE — ED Notes (Signed)
PT stated she woke up this morning with nausea and vomiting x2 with abdominal distention and tenderness. PT states last BM x2 days ago and denies any urinary symptoms.

## 2015-09-19 NOTE — ED Provider Notes (Signed)
CSN: [id number]     Arrival date & time 09/19/15  7026 History   First MD Initiated Contact with Patient 09/19/15 0725     Chief Complaint  Patient presents with  . Emesis     (Consider location/radiation/quality/duration/timing/severity/associated sxs/prior Treatment) Patient is a 59 y.o. female presenting with vomiting. The history is provided by the patient (Patient complains of vomiting and loose stools today some abdominal cramping no blood in vomit or diarrhea).  Emesis Severity:  Mild Timing:  Intermittent Quality:  Bilious material Able to tolerate:  Liquids Progression:  Unchanged Associated symptoms: diarrhea   Associated symptoms: no abdominal pain and no headaches     Past Medical History  Diagnosis Date  . COPD (chronic obstructive pulmonary disease) ([place])   . Hypertension   . Headache   . Chronic back pain   . Normal cardiac stress test 12/2014   Past Surgical History  Procedure Laterality Date  . Abdominal hysterectomy    . Cholecystectomy     History reviewed. No pertinent family history. Social History  Substance Use Topics  . Smoking status: Current Every Day Smoker -- 0.50 packs/day    Types: Cigarettes  . Smokeless tobacco: None  . Alcohol Use: No   OB History    Gravida Para Term Preterm AB TAB SAB Ectopic Multiple Living            3     Review of Systems  Constitutional: Negative for appetite change and fatigue.  HENT: Negative for congestion, ear discharge and sinus pressure.   Eyes: Negative for discharge.  Respiratory: Negative for cough.   Cardiovascular: Negative for chest pain.  Gastrointestinal: Positive for vomiting and diarrhea. Negative for abdominal pain.  Genitourinary: Negative for frequency and hematuria.  Musculoskeletal: Negative for back pain.  Skin: Negative for rash.  Neurological: Negative for seizures and headaches.  Psychiatric/Behavioral: Negative for hallucinations.      Allergies  Review of patient's  allergies indicates no known allergies.  Home Medications   Prior to Admission medications   Medication Sig Start Date End Date Taking? Authorizing Provider  atorvastatin (LIPITOR) 20 MG tablet Take 1 tablet (20 mg total) by mouth daily. 12/23/14   Fay Records, MD  dicyclomine (BENTYL) 20 MG tablet Take one every 6 hours for abd cramps 09/19/15   Milton Ferguson, MD  HYDROcodone-acetaminophen (NORCO/VICODIN) 5-325 MG per tablet 1 or 2 tabs PO q6 hours prn pain 02/17/15   Francine Graven, DO  ibuprofen (ADVIL,MOTRIN) 600 MG tablet Take 1 tablet (600 mg total) by mouth 4 (four) times daily. 06/09/15   Lily Kocher, PA-C  lisinopril (PRINIVIL,ZESTRIL) 10 MG tablet Take 1 tablet (10 mg total) by mouth daily. 12/13/14   Nita Sells, MD  metoprolol tartrate (LOPRESSOR) 12.5 mg TABS tablet Take 0.5 tablets (12.5 mg total) by mouth 2 (two) times daily. 12/13/14   Nita Sells, MD  predniSONE (DELTASONE) 20 MG tablet Take 3 tablets (60 mg total) by mouth daily. 07/13/15   Kristen N Ward, DO  promethazine (PHENERGAN) 25 MG tablet Take 1 tablet (25 mg total) by mouth every 6 (six) hours as needed for nausea or vomiting. 02/27/15   Rolland Porter, MD   BP 147/78 mmHg  Pulse 74  Temp(Src) 97.6 F (36.4 C) (Oral)  Resp 16  Ht 5\' 2"  (1.575 m)  Wt 135 lb (61.236 kg)  BMI 24.69 kg/m2  SpO2 98% Physical Exam  Constitutional: She is oriented to person, place, and time. She appears well-developed.  HENT:  Head: Normocephalic.  Eyes: Conjunctivae and EOM are normal. No scleral icterus.  Neck: Neck supple. No thyromegaly present.  Cardiovascular: Normal rate and regular rhythm.  Exam reveals no gallop and no friction rub.   No murmur heard. Pulmonary/Chest: No stridor. She has no wheezes. She has no rales. She exhibits no tenderness.  Abdominal: She exhibits no distension. There is tenderness. There is no rebound.  Mild tenderness  Musculoskeletal: Normal range of motion. She exhibits no edema.   Lymphadenopathy:    She has no cervical adenopathy.  Neurological: She is oriented to person, place, and time. She exhibits normal muscle tone. Coordination normal.  Skin: No rash noted. No erythema.  Psychiatric: She has a normal mood and affect. Her behavior is normal.    ED Course  Procedures (including critical care time) Labs Review Labs Reviewed  COMPREHENSIVE METABOLIC PANEL - Abnormal; Notable for the following:    Glucose, Bld 101 (*)    Total Bilirubin 0.2 (*)    All other components within normal limits  LIPASE, BLOOD  CBC  URINALYSIS, ROUTINE W REFLEX MICROSCOPIC (NOT AT [facility])    Imaging Review Ct Abdomen Pelvis W Contrast  09/19/2015   CLINICAL DATA:  Nausea and vomiting with abdominal tenderness and distention.  EXAM: CT ABDOMEN AND PELVIS WITH CONTRAST  TECHNIQUE: Multidetector CT imaging of the abdomen and pelvis was performed using the standard protocol following bolus administration of intravenous contrast. Oral contrast was also administered.  CONTRAST:  30mL OMNIPAQUE IOHEXOL 300 MG/ML SOLN, 136mL OMNIPAQUE IOHEXOL 300 MG/ML SOLN  COMPARISON:  February 17, 2015  FINDINGS: Lower chest: There is bibasilar lung atelectasis. No edema or consolidation in lung bases. On the initial CT slice, there is a 5 mm nodular opacity in the lateral segment of the right middle lobe.  Hepatobiliary: No focal liver lesions are identified. Gallbladder is absent. There is slight prominence of the common hepatic and common bile duct without mass or calculus seen. This appearance is consistent with post cholecystectomy state.  Pancreas: No mass or inflammatory focus. The pancreatic duct does not appear dilated.  Spleen: No lesion identified.  Adrenals/Urinary Tract: Adrenals appear normal bilaterally. Kidneys bilaterally show no mass or hydronephrosis on either side. There is no renal or ureteral calculus on either side. Urinary bladder is midline with wall thickness within normal limits.   Stomach/Bowel: There is no bowel wall or mesenteric thickening. There is moderate stool throughout the colon. No bowel obstruction. No free air or portal venous air.  Vascular/Lymphatic: There is atherosclerotic change in the aorta and iliac arteries without demonstrable aneurysm. There is no adenopathy in the abdomen or pelvis.  Reproductive: Uterus is absent. There are surgical clips in the left pelvic region. There is no pelvic mass or pelvic fluid collection.  Other: Appendix appears normal. No abscess or ascites in the abdomen or pelvis.  Musculoskeletal: There are no my intramuscular or abdominal wall lesions. There are no blastic or lytic bone lesions.  IMPRESSION: No bowel obstruction.  No abscess.  Appendix appears normal.  Gallbladder and uterus are absent.  5 mm nodular opacity right middle lobe. Followup of this nodular opacity should be based on Fleischner Society guidelines. Given risk factors for bronchogenic carcinoma, follow-up chest CT at 6 - 12 months is recommended. This recommendation follows the consensus statement: Guidelines for Management of Small Pulmonary Nodules Detected on CT Scans: A Statement from the [place] as published in Radiology 2005;237:395-400.   Electronically Signed   By:  Lowella Grip III M.D.   On: 09/19/2015 09:22   I have personally reviewed and evaluated these images and lab results as part of my medical decision-making.   EKG Interpretation None      MDM   Final diagnoses:  Gastroenteritis    Labs and CT unremarkable. Patient improved with nausea medicine here in emergency department. We will treat her symptoms with Zofran in Bentyl as needed. And she is to follow-up with her PCP    Milton Ferguson, MD 09/19/15 1029

## 2015-11-18 ENCOUNTER — Emergency Department (HOSPITAL_COMMUNITY)
Admission: EM | Admit: 2015-11-18 | Discharge: 2015-11-18 | Disposition: A | Discharge status: Home / Self Care | Payer: Medicare Other | Attending: Emergency Medicine | Admitting: Emergency Medicine

## 2015-11-18 ENCOUNTER — Emergency Department (HOSPITAL_COMMUNITY): Payer: Medicare Other

## 2015-11-18 ENCOUNTER — Encounter (HOSPITAL_COMMUNITY): Payer: Self-pay | Admitting: Cardiology

## 2015-11-18 DIAGNOSIS — I10 Essential (primary) hypertension: Secondary | ICD-10-CM | POA: Insufficient documentation

## 2015-11-18 DIAGNOSIS — R112 Nausea with vomiting, unspecified: Secondary | ICD-10-CM | POA: Diagnosis not present

## 2015-11-18 DIAGNOSIS — J449 Chronic obstructive pulmonary disease, unspecified: Secondary | ICD-10-CM | POA: Insufficient documentation

## 2015-11-18 DIAGNOSIS — R1084 Generalized abdominal pain: Secondary | ICD-10-CM | POA: Diagnosis not present

## 2015-11-18 DIAGNOSIS — Z79899 Other long term (current) drug therapy: Secondary | ICD-10-CM | POA: Diagnosis not present

## 2015-11-18 DIAGNOSIS — F17210 Nicotine dependence, cigarettes, uncomplicated: Secondary | ICD-10-CM | POA: Insufficient documentation

## 2015-11-18 DIAGNOSIS — R109 Unspecified abdominal pain: Secondary | ICD-10-CM | POA: Diagnosis not present

## 2015-11-18 DIAGNOSIS — G8929 Other chronic pain: Secondary | ICD-10-CM | POA: Diagnosis not present

## 2015-11-18 LAB — CBC
HEMATOCRIT: 41.9 % (ref 36.0–46.0)
HEMOGLOBIN: 13.8 g/dL (ref 12.0–15.0)
MCH: 28.5 pg (ref 26.0–34.0)
MCHC: 32.9 g/dL (ref 30.0–36.0)
MCV: 86.4 fL (ref 78.0–100.0)
Platelets: 202 10*3/uL (ref 150–400)
RBC: 4.85 MIL/uL (ref 3.87–5.11)
RDW: 13.7 % (ref 11.5–15.5)
WBC: 6 10*3/uL (ref 4.0–10.5)

## 2015-11-18 LAB — COMPREHENSIVE METABOLIC PANEL
ALT: 11 U/L — ABNORMAL LOW (ref 14–54)
ANION GAP: 10 (ref 5–15)
AST: 14 U/L — ABNORMAL LOW (ref 15–41)
Albumin: 4 g/dL (ref 3.5–5.0)
Alkaline Phosphatase: 88 U/L (ref 38–126)
BILIRUBIN TOTAL: 0.6 mg/dL (ref 0.3–1.2)
BUN: 8 mg/dL (ref 6–20)
CO2: 24 mmol/L (ref 22–32)
Calcium: 9 mg/dL (ref 8.9–10.3)
Chloride: 106 mmol/L (ref 101–111)
Creatinine, Ser: 0.95 mg/dL (ref 0.44–1.00)
Glucose, Bld: 86 mg/dL (ref 65–99)
POTASSIUM: 3.8 mmol/L (ref 3.5–5.1)
Sodium: 140 mmol/L (ref 135–145)
TOTAL PROTEIN: 7 g/dL (ref 6.5–8.1)

## 2015-11-18 LAB — URINALYSIS, ROUTINE W REFLEX MICROSCOPIC
Bilirubin Urine: NEGATIVE
Glucose, UA: NEGATIVE mg/dL
Hgb urine dipstick: NEGATIVE
KETONES UR: NEGATIVE mg/dL
LEUKOCYTES UA: NEGATIVE
Nitrite: NEGATIVE
Protein, ur: NEGATIVE mg/dL
SPECIFIC GRAVITY, URINE: 1.01 (ref 1.005–1.030)
pH: 5.5 (ref 5.0–8.0)

## 2015-11-18 LAB — LIPASE, BLOOD: Lipase: 35 U/L (ref 11–51)

## 2015-11-18 LAB — D-DIMER, QUANTITATIVE: D-Dimer, Quant: 0.34 ug/mL-FEU (ref 0.00–0.50)

## 2015-11-18 MED ORDER — ONDANSETRON HCL 4 MG/2ML IJ SOLN
4.0000 mg | Freq: Once | INTRAMUSCULAR | Status: AC
  Administered 2015-11-18: 4 mg via INTRAVENOUS
  Filled 2015-11-18: qty 2

## 2015-11-18 MED ORDER — ONDANSETRON 4 MG PO TBDP
4.0000 mg | ORAL_TABLET | Freq: Three times a day (TID) | ORAL | Status: DC | PRN
Start: 2015-11-18 — End: 2016-06-23

## 2015-11-18 MED ORDER — SODIUM CHLORIDE 0.9 % IV SOLN
INTRAVENOUS | Status: DC
  Administered 2015-11-18: 19:00:00 via INTRAVENOUS

## 2015-11-18 MED ORDER — IOHEXOL 300 MG/ML  SOLN
50.0000 mL | Freq: Once | INTRAMUSCULAR | Status: AC | PRN
  Administered 2015-11-18: 50 mL via ORAL

## 2015-11-18 MED ORDER — IOHEXOL 300 MG/ML  SOLN
100.0000 mL | Freq: Once | INTRAMUSCULAR | Status: AC | PRN
  Administered 2015-11-18: 100 mL via INTRAVENOUS

## 2015-11-18 MED ORDER — HYDROCODONE-ACETAMINOPHEN 5-325 MG PO TABS: 1.0000 | ORAL_TABLET | Freq: Four times a day (QID) | ORAL | Status: DC | PRN

## 2015-11-18 MED ORDER — SODIUM CHLORIDE 0.9 % IV BOLUS (SEPSIS)
1000.0000 mL | Freq: Once | INTRAVENOUS | Status: AC
  Administered 2015-11-18: 1000 mL via INTRAVENOUS

## 2015-11-18 MED ORDER — HYDROMORPHONE HCL 1 MG/ML IJ SOLN
1.0000 mg | Freq: Once | INTRAMUSCULAR | Status: AC
  Administered 2015-11-18: 1 mg via INTRAVENOUS
  Filled 2015-11-18: qty 1

## 2015-11-18 NOTE — ED Notes (Signed)
Having sharp abdominal pain for last two days after eating 2 hot dogs.  Rates pain 8/10.

## 2015-11-18 NOTE — Discharge Instructions (Signed)
Today's workup with abdominal pain and vomiting without any acute findings. Take medications as directed. Make an appointment to follow-up with your regular doctor. Return for any new or worse symptoms. Urinalysis was normal.

## 2015-11-18 NOTE — ED Notes (Signed)
Abdominal pain and vomiting times 3 days.

## 2015-11-18 NOTE — ED Provider Notes (Signed)
CSN: [id number]     Arrival date & time 11/18/15  1526 History   First MD Initiated Contact with Patient 11/18/15 1659     Chief Complaint  Patient presents with  . Abdominal Pain  . Emesis     (Consider location/radiation/quality/duration/timing/severity/associated sxs/prior Treatment) Patient is a 59 y.o. female presenting with abdominal pain and vomiting. The history is provided by the patient.  Abdominal Pain Associated symptoms: nausea and vomiting   Associated symptoms: no chest pain, no diarrhea, no dysuria, no fever and no shortness of breath   Emesis Associated symptoms: abdominal pain   Associated symptoms: no diarrhea and no headaches    patient with a 2 day history of generalized abdominal pain nausea and vomiting no diarrhea no fevers no dysuria following eating 2 hotdogs. Patient states that the pain is 8 out of 10. Sharp in nature. Nonradiating.  Past Medical History  Diagnosis Date  . COPD (chronic obstructive pulmonary disease) ([place])   . Hypertension   . Headache   . Chronic back pain   . Normal cardiac stress test 12/2014   Past Surgical History  Procedure Laterality Date  . Abdominal hysterectomy    . Cholecystectomy     History reviewed. No pertinent family history. Social History  Substance Use Topics  . Smoking status: Current Every Day Smoker -- 0.50 packs/day    Types: Cigarettes  . Smokeless tobacco: None  . Alcohol Use: No   OB History    Gravida Para Term Preterm AB TAB SAB Ectopic Multiple Living            3     Review of Systems  Constitutional: Negative for fever.  HENT: Negative for congestion.   Eyes: Negative for visual disturbance.  Respiratory: Negative for shortness of breath.   Cardiovascular: Negative for chest pain.  Gastrointestinal: Positive for nausea, vomiting and abdominal pain. Negative for diarrhea.  Genitourinary: Negative for dysuria.  Musculoskeletal: Negative for back pain.  Skin: Negative for rash.   Neurological: Negative for headaches.  Hematological: Does not bruise/bleed easily.  Psychiatric/Behavioral: Negative for confusion.      Allergies  Review of patient's allergies indicates no known allergies.  Home Medications   Prior to Admission medications   Medication Sig Start Date End Date Taking? Authorizing Provider  lisinopril (PRINIVIL,ZESTRIL) 10 MG tablet Take 1 tablet (10 mg total) by mouth daily. 12/13/14  Yes Nita Sells, MD  metoprolol tartrate (LOPRESSOR) 12.5 mg TABS tablet Take 0.5 tablets (12.5 mg total) by mouth 2 (two) times daily. 12/13/14  Yes Nita Sells, MD  atorvastatin (LIPITOR) 20 MG tablet Take 1 tablet (20 mg total) by mouth daily. 12/23/14   Fay Records, MD  HYDROcodone-acetaminophen (NORCO/VICODIN) 5-325 MG per tablet 1 or 2 tabs PO q6 hours prn pain Patient not taking: Reported on 11/18/2015 02/17/15   Francine Graven, DO  HYDROcodone-acetaminophen (NORCO/VICODIN) 5-325 MG tablet Take 1-2 tablets by mouth every 6 (six) hours as needed. 11/18/15   Fredia Sorrow, MD  ondansetron (ZOFRAN ODT) 4 MG disintegrating tablet Take 1 tablet (4 mg total) by mouth every 8 (eight) hours as needed. 11/18/15   Fredia Sorrow, MD   BP 174/93 mmHg  Pulse 55  Temp(Src) 98 F (36.7 C) (Oral)  Resp 16  Ht 5\' 2"  (1.575 m)  Wt 63.504 kg  BMI 25.60 kg/m2  SpO2 99% Physical Exam  Constitutional: She is oriented to person, place, and time. She appears well-developed and well-nourished. No distress.  HENT:  Head:  Normocephalic and atraumatic.  Mouth/Throat: Oropharynx is clear and moist.  Eyes: Conjunctivae and EOM are normal. Pupils are equal, round, and reactive to light.  Neck: Normal range of motion. Neck supple.  Cardiovascular: Normal rate, regular rhythm and normal heart sounds.   No murmur heard. Pulmonary/Chest: Effort normal and breath sounds normal. No respiratory distress.  Abdominal: Soft. Bowel sounds are normal. There is no tenderness.   Musculoskeletal: Normal range of motion.  Neurological: She is alert and oriented to person, place, and time. No cranial nerve deficit. She exhibits normal muscle tone. Coordination normal.  Skin: Skin is warm.  Nursing note and vitals reviewed.   ED Course  Procedures (including critical care time) Labs Review Labs Reviewed  COMPREHENSIVE METABOLIC PANEL - Abnormal; Notable for the following:    AST 14 (*)    ALT 11 (*)    All other components within normal limits  LIPASE, BLOOD  CBC  URINALYSIS, ROUTINE W REFLEX MICROSCOPIC (NOT AT [facility])  D-DIMER, QUANTITATIVE (NOT AT [facility])   Results for orders placed or performed during the hospital encounter of 11/18/15  Lipase, blood  Result Value Ref Range   Lipase 35 11 - 51 U/L  Comprehensive metabolic panel  Result Value Ref Range   Sodium 140 135 - 145 mmol/L   Potassium 3.8 3.5 - 5.1 mmol/L   Chloride 106 101 - 111 mmol/L   CO2 24 22 - 32 mmol/L   Glucose, Bld 86 65 - 99 mg/dL   BUN 8 6 - 20 mg/dL   Creatinine, Ser 0.95 0.44 - 1.00 mg/dL   Calcium 9.0 8.9 - 10.3 mg/dL   Total Protein 7.0 6.5 - 8.1 g/dL   Albumin 4.0 3.5 - 5.0 g/dL   AST 14 (L) 15 - 41 U/L   ALT 11 (L) 14 - 54 U/L   Alkaline Phosphatase 88 38 - 126 U/L   Total Bilirubin 0.6 0.3 - 1.2 mg/dL   GFR calc non Af Amer >60 >60 mL/min   GFR calc Af Amer >60 >60 mL/min   Anion gap 10 5 - 15  CBC  Result Value Ref Range   WBC 6.0 4.0 - 10.5 K/uL   RBC 4.85 3.87 - 5.11 MIL/uL   Hemoglobin 13.8 12.0 - 15.0 g/dL   HCT 41.9 36.0 - 46.0 %   MCV 86.4 78.0 - 100.0 fL   MCH 28.5 26.0 - 34.0 pg   MCHC 32.9 30.0 - 36.0 g/dL   RDW 13.7 11.5 - 15.5 %   Platelets 202 150 - 400 K/uL  Urinalysis, Routine w reflex microscopic (not at [facility])  Result Value Ref Range   Color, Urine YELLOW YELLOW   APPearance CLEAR CLEAR   Specific Gravity, Urine 1.010 1.005 - 1.030   pH 5.5 5.0 - 8.0   Glucose, UA NEGATIVE NEGATIVE mg/dL   Hgb urine dipstick NEGATIVE NEGATIVE   Bilirubin  Urine NEGATIVE NEGATIVE   Ketones, ur NEGATIVE NEGATIVE mg/dL   Protein, ur NEGATIVE NEGATIVE mg/dL   Nitrite NEGATIVE NEGATIVE   Leukocytes, UA NEGATIVE NEGATIVE  D-dimer, quantitative (not at [facility])  Result Value Ref Range   D-Dimer, Quant 0.34 0.00 - 0.50 ug/mL-FEU     Imaging Review Ct Abdomen Pelvis W Contrast  11/18/2015  CLINICAL DATA:  Sharp abdominal pain for 2 days EXAM: CT ABDOMEN AND PELVIS WITH CONTRAST TECHNIQUE: Multidetector CT imaging of the abdomen and pelvis was performed using the standard protocol following bolus administration of intravenous contrast. CONTRAST:  43mL  OMNIPAQUE IOHEXOL 300 MG/ML SOLN, 131mL OMNIPAQUE IOHEXOL 300 MG/ML SOLN COMPARISON:  09/19/2015 FINDINGS: Lower chest:  Lung bases are clear.  Normal heart size. Hepatobiliary: Normal liver.  Prior cholecystectomy. Pancreas: Normal. Spleen: Normal. Adrenals/Urinary Tract: Normal adrenal glands. Normal kidneys. No urolithiasis or obstructive uropathy. Unremarkable bladder. Stomach/Bowel: No bowel wall thickening or dilatation. No pneumatosis, pneumoperitoneum or portal venous gas. No abdominal or pelvic free fluid. Vascular/Lymphatic: Normal caliber abdominal aorta with atherosclerosis. No abdominal or pelvic lymphadenopathy. Reproductive: Prior hysterectomy.  No adnexal mass. Other: No fluid collection or hematoma. Musculoskeletal: No lytic or sclerotic osseous lesion. No acute osseous abnormality. IMPRESSION: 1. No acute abdominal or pelvic pathology. Electronically Signed   By: Kathreen Devoid   On: 11/18/2015 18:15   I have personally reviewed and evaluated these images and lab results as part of my medical decision-making.   EKG Interpretation None      MDM   Final diagnoses:  Non-intractable vomiting with nausea, vomiting of unspecified type  Generalized abdominal pain    Patient's labs without the any significant abnormality. CT scan negative for any acute abdominal process. Not sure exactly how  patient d-dimer ordered however was normal. No concerns really for any longer pulmonary problems. Patient with treatment here feels much better. Treat symptomatically with hydrocodone and Zofran. Patient return for a newer worse symptoms.    Fredia Sorrow, MD 11/18/15 [phone]

## 2015-11-20 ENCOUNTER — Encounter (HOSPITAL_COMMUNITY): Payer: Self-pay | Admitting: Emergency Medicine

## 2015-11-20 ENCOUNTER — Emergency Department (HOSPITAL_COMMUNITY)
Admission: EM | Admit: 2015-11-20 | Discharge: 2015-11-20 | Disposition: A | Discharge status: Home / Self Care | Payer: Medicare Other | Attending: Emergency Medicine | Admitting: Emergency Medicine

## 2015-11-20 DIAGNOSIS — F17210 Nicotine dependence, cigarettes, uncomplicated: Secondary | ICD-10-CM | POA: Insufficient documentation

## 2015-11-20 DIAGNOSIS — Z79899 Other long term (current) drug therapy: Secondary | ICD-10-CM | POA: Insufficient documentation

## 2015-11-20 DIAGNOSIS — R110 Nausea: Secondary | ICD-10-CM | POA: Insufficient documentation

## 2015-11-20 DIAGNOSIS — I10 Essential (primary) hypertension: Secondary | ICD-10-CM | POA: Insufficient documentation

## 2015-11-20 DIAGNOSIS — G8929 Other chronic pain: Secondary | ICD-10-CM | POA: Diagnosis not present

## 2015-11-20 DIAGNOSIS — J449 Chronic obstructive pulmonary disease, unspecified: Secondary | ICD-10-CM | POA: Diagnosis not present

## 2015-11-20 MED ORDER — PROMETHAZINE HCL 25 MG/ML IJ SOLN
12.5000 mg | Freq: Once | INTRAMUSCULAR | Status: AC
  Administered 2015-11-20: 12.5 mg via INTRAVENOUS
  Filled 2015-11-20: qty 1

## 2015-11-20 MED ORDER — PROMETHAZINE HCL 25 MG PO TABS: 25.0000 mg | ORAL_TABLET | Freq: Three times a day (TID) | ORAL | Status: DC | PRN

## 2015-11-20 MED ORDER — MORPHINE SULFATE (PF) 4 MG/ML IV SOLN
4.0000 mg | Freq: Once | INTRAVENOUS | Status: AC
  Administered 2015-11-20: 4 mg via INTRAVENOUS
  Filled 2015-11-20: qty 1

## 2015-11-20 MED ORDER — SODIUM CHLORIDE 0.9 % IV BOLUS (SEPSIS)
1000.0000 mL | Freq: Once | INTRAVENOUS | Status: AC
  Administered 2015-11-20: 1000 mL via INTRAVENOUS

## 2015-11-20 NOTE — ED Notes (Signed)
Pt states that she continues to have nausea.  Medicaid would not pay for medications given 2 days ago.  Pt states that a nurse called her today an told her she needed fluids.

## 2015-11-20 NOTE — ED Notes (Signed)
Pt asking for another pain medication prescription, states that she did not get the hydrocodone filled that was prescribed on 11/18/2015, states that it was still at the pharmacy, advised pt to pick up the prescription from the pharmacy tomorrow,

## 2015-11-20 NOTE — Discharge Instructions (Signed)
Nausea and Vomiting  Nausea means you feel sick to your stomach. Throwing up (vomiting) is a reflex where stomach contents come out of your mouth.  HOME CARE   · Take medicine as told by your doctor.  · Do not force yourself to eat. However, you do need to drink fluids.  · If you feel like eating, eat a normal diet as told by your doctor.    Eat rice, wheat, potatoes, bread, lean meats, yogurt, fruits, and vegetables.    Avoid high-fat foods.  · Drink enough fluids to keep your pee (urine) clear or pale yellow.  · Ask your doctor how to replace body fluid losses (rehydrate). Signs of body fluid loss (dehydration) include:    Feeling very thirsty.    Dry lips and mouth.    Feeling dizzy.    Dark pee.    Peeing less than normal.    Feeling confused.    Fast breathing or heart rate.  GET HELP RIGHT AWAY IF:   · You have blood in your throw up.  · You have black or bloody poop (stool).  · You have a bad headache or stiff neck.  · You feel confused.  · You have bad belly (abdominal) pain.  · You have chest pain or trouble breathing.  · You do not pee at least once every 8 hours.  · You have cold, clammy skin.  · You keep throwing up after 24 to 48 hours.  · You have a fever.  MAKE SURE YOU:   · Understand these instructions.  · Will watch your condition.  · Will get help right away if you are not doing well or get worse.     This information is not intended to replace advice given to you by your health care provider. Make sure you discuss any questions you have with your health care provider.     Document Released: 05/22/2008 Document Revised: 02/26/2012 Document Reviewed: 05/05/2011  Elsevier Interactive Patient Education ©2016 [employer]

## 2015-11-27 NOTE — ED Provider Notes (Signed)
CSN: [id number]     Arrival date & time 11/20/15  1719 History   First MD Initiated Contact with Patient 11/20/15 1908     Chief Complaint  Patient presents with  . Emesis     (Consider location/radiation/quality/duration/timing/severity/associated sxs/prior Treatment) HPI   59 year old female nausea. Patient seen in the emergency room 2 days ago for the same complaint. She's had continued nausea since then. No further vomiting. Anorexia. Prescribed Zofran on last lesion but was unable to fill it because of financial constraints. She is presenting today patient feels like she is dehydrated needs IV fluids. No fevers or chills. No sick contacts. No diarrhea.   Past Medical History  Diagnosis Date  . COPD (chronic obstructive pulmonary disease) ([place])   . Hypertension   . Headache   . Chronic back pain   . Normal cardiac stress test 12/2014   Past Surgical History  Procedure Laterality Date  . Abdominal hysterectomy    . Cholecystectomy     History reviewed. No pertinent family history. Social History  Substance Use Topics  . Smoking status: Current Every Day Smoker -- 0.50 packs/day    Types: Cigarettes  . Smokeless tobacco: None  . Alcohol Use: No   OB History    Gravida Para Term Preterm AB TAB SAB Ectopic Multiple Living            3     Review of Systems  All systems reviewed and negative, other than as noted in HPI.   Allergies  Review of patient's allergies indicates no known allergies.  Home Medications   Prior to Admission medications   Medication Sig Start Date End Date Taking? Authorizing Provider  lisinopril (PRINIVIL,ZESTRIL) 10 MG tablet Take 1 tablet (10 mg total) by mouth daily. 12/13/14  Yes Nita Sells, MD  metoprolol tartrate (LOPRESSOR) 12.5 mg TABS tablet Take 0.5 tablets (12.5 mg total) by mouth 2 (two) times daily. 12/13/14  Yes Nita Sells, MD  atorvastatin (LIPITOR) 20 MG tablet Take 1 tablet (20 mg total) by mouth  daily. Patient not taking: Reported on 11/20/2015 12/23/14   Fay Records, MD  HYDROcodone-acetaminophen (NORCO/VICODIN) 5-325 MG per tablet 1 or 2 tabs PO q6 hours prn pain Patient not taking: Reported on 11/18/2015 02/17/15   Francine Graven, DO  HYDROcodone-acetaminophen (NORCO/VICODIN) 5-325 MG tablet Take 1-2 tablets by mouth every 6 (six) hours as needed. Patient not taking: Reported on 11/20/2015 11/18/15   Fredia Sorrow, MD  ondansetron (ZOFRAN ODT) 4 MG disintegrating tablet Take 1 tablet (4 mg total) by mouth every 8 (eight) hours as needed. Patient not taking: Reported on 11/20/2015 11/18/15   Fredia Sorrow, MD  promethazine (PHENERGAN) 25 MG tablet Take 1 tablet (25 mg total) by mouth every 8 (eight) hours as needed for nausea or vomiting. 11/20/15   Virgel Manifold, MD   BP 158/82 mmHg  Pulse 62  Temp(Src) 98.2 F (36.8 C) (Oral)  Resp 20  Ht 5\' 2"  (1.575 m)  Wt 140 lb (63.504 kg)  BMI 25.60 kg/m2  SpO2 99% Physical Exam  Constitutional: She appears well-developed and well-nourished. No distress.  HENT:  Head: Normocephalic and atraumatic.  Eyes: Conjunctivae are normal. Right eye exhibits no discharge. Left eye exhibits no discharge.  Neck: Neck supple.  Cardiovascular: Normal rate, regular rhythm and normal heart sounds.  Exam reveals no gallop and no friction rub.   No murmur heard. Pulmonary/Chest: Effort normal and breath sounds normal. No respiratory distress.  Abdominal: Soft. She exhibits no  distension. There is no tenderness.  Musculoskeletal: She exhibits no edema or tenderness.  Neurological: She is alert.  Skin: Skin is warm and dry.  Psychiatric: She has a normal mood and affect. Her behavior is normal. Thought content normal.  Nursing note and vitals reviewed.   ED Course  Procedures (including critical care time) Labs Review Labs Reviewed - No data to display  Imaging Review No results found. I have personally reviewed and evaluated these images and lab  results as part of my medical decision-making.   EKG Interpretation None      MDM   Final diagnoses:  Nausea    59 year old female with continued nausea. No other acute complaints since last evaluated. She apparently cannot afford her prescribing antiemetic. Treated symptomatically in the emergency room. Further workup was deferred without change since last evaluation. No medication prescribed. Return precautions discussed.    Virgel Manifold, MD 11/27/15 [phone]

## 2015-12-09 DIAGNOSIS — I10 Essential (primary) hypertension: Secondary | ICD-10-CM | POA: Diagnosis not present

## 2015-12-09 DIAGNOSIS — R51 Headache: Secondary | ICD-10-CM | POA: Diagnosis not present

## 2015-12-17 ENCOUNTER — Emergency Department (HOSPITAL_COMMUNITY)
Admission: EM | Admit: 2015-12-17 | Discharge: 2015-12-17 | Disposition: A | Discharge status: Home / Self Care | Payer: Medicare Other | Attending: Emergency Medicine | Admitting: Emergency Medicine

## 2015-12-17 ENCOUNTER — Encounter (HOSPITAL_COMMUNITY): Payer: Self-pay

## 2015-12-17 ENCOUNTER — Emergency Department (HOSPITAL_COMMUNITY): Payer: Medicare Other

## 2015-12-17 DIAGNOSIS — I10 Essential (primary) hypertension: Secondary | ICD-10-CM | POA: Diagnosis not present

## 2015-12-17 DIAGNOSIS — G8929 Other chronic pain: Secondary | ICD-10-CM | POA: Diagnosis not present

## 2015-12-17 DIAGNOSIS — J449 Chronic obstructive pulmonary disease, unspecified: Secondary | ICD-10-CM | POA: Diagnosis not present

## 2015-12-17 DIAGNOSIS — Z79899 Other long term (current) drug therapy: Secondary | ICD-10-CM | POA: Diagnosis not present

## 2015-12-17 DIAGNOSIS — F17210 Nicotine dependence, cigarettes, uncomplicated: Secondary | ICD-10-CM | POA: Insufficient documentation

## 2015-12-17 DIAGNOSIS — R1084 Generalized abdominal pain: Secondary | ICD-10-CM | POA: Diagnosis not present

## 2015-12-17 DIAGNOSIS — R1013 Epigastric pain: Secondary | ICD-10-CM | POA: Diagnosis not present

## 2015-12-17 DIAGNOSIS — Z791 Long term (current) use of non-steroidal anti-inflammatories (NSAID): Secondary | ICD-10-CM | POA: Diagnosis not present

## 2015-12-17 DIAGNOSIS — R109 Unspecified abdominal pain: Secondary | ICD-10-CM | POA: Insufficient documentation

## 2015-12-17 LAB — CBC WITH DIFFERENTIAL/PLATELET
BASOS PCT: 1 %
Basophils Absolute: 0 10*3/uL (ref 0.0–0.1)
EOS ABS: 0.1 10*3/uL (ref 0.0–0.7)
Eosinophils Relative: 2 %
HEMATOCRIT: 42.9 % (ref 36.0–46.0)
HEMOGLOBIN: 13.7 g/dL (ref 12.0–15.0)
LYMPHS ABS: 3.5 10*3/uL (ref 0.7–4.0)
Lymphocytes Relative: 50 %
MCH: 27.8 pg (ref 26.0–34.0)
MCHC: 31.9 g/dL (ref 30.0–36.0)
MCV: 87 fL (ref 78.0–100.0)
MONO ABS: 0.6 10*3/uL (ref 0.1–1.0)
MONOS PCT: 8 %
NEUTROS PCT: 39 %
Neutro Abs: 2.7 10*3/uL (ref 1.7–7.7)
PLATELETS: 206 10*3/uL (ref 150–400)
RBC: 4.93 MIL/uL (ref 3.87–5.11)
RDW: 14 % (ref 11.5–15.5)
WBC: 6.9 10*3/uL (ref 4.0–10.5)

## 2015-12-17 LAB — COMPREHENSIVE METABOLIC PANEL
ALBUMIN: 4 g/dL (ref 3.5–5.0)
ALT: 15 U/L (ref 14–54)
AST: 23 U/L (ref 15–41)
Alkaline Phosphatase: 89 U/L (ref 38–126)
Anion gap: 4 — ABNORMAL LOW (ref 5–15)
BILIRUBIN TOTAL: 0.4 mg/dL (ref 0.3–1.2)
BUN: 15 mg/dL (ref 6–20)
CHLORIDE: 110 mmol/L (ref 101–111)
CO2: 26 mmol/L (ref 22–32)
Calcium: 8.8 mg/dL — ABNORMAL LOW (ref 8.9–10.3)
Creatinine, Ser: 0.9 mg/dL (ref 0.44–1.00)
GFR calc Af Amer: >60 mL/min (ref >60)
GFR calc non Af Amer: >60 mL/min (ref >60)
GLUCOSE: 103 mg/dL — AB (ref 65–99)
POTASSIUM: 4.5 mmol/L (ref 3.5–5.1)
SODIUM: 140 mmol/L (ref 135–145)
Total Protein: 7 g/dL (ref 6.5–8.1)

## 2015-12-17 LAB — LIPASE, BLOOD: Lipase: 46 U/L (ref 11–51)

## 2015-12-17 MED ORDER — ONDANSETRON HCL 4 MG/2ML IJ SOLN
4.0000 mg | Freq: Once | INTRAMUSCULAR | Status: AC
  Administered 2015-12-17: 4 mg via INTRAVENOUS
  Filled 2015-12-17: qty 2

## 2015-12-17 MED ORDER — MORPHINE SULFATE (PF) 4 MG/ML IV SOLN
4.0000 mg | Freq: Once | INTRAVENOUS | Status: AC
  Administered 2015-12-17: 4 mg via INTRAVENOUS
  Filled 2015-12-17: qty 1

## 2015-12-17 MED ORDER — SODIUM CHLORIDE 0.9 % IV BOLUS (SEPSIS)
500.0000 mL | Freq: Once | INTRAVENOUS | Status: AC
  Administered 2015-12-17: 500 mL via INTRAVENOUS

## 2015-12-17 MED ORDER — DICYCLOMINE HCL 20 MG PO TABS: 20.0000 mg | ORAL_TABLET | Freq: Two times a day (BID) | ORAL | Status: DC

## 2015-12-17 MED ORDER — RANITIDINE HCL 150 MG PO TABS: 150.0000 mg | ORAL_TABLET | Freq: Two times a day (BID) | ORAL | Status: DC

## 2015-12-17 NOTE — Discharge Instructions (Signed)

## 2015-12-17 NOTE — ED Notes (Signed)
Pt admits to chronic abd pain, states she has been to her pmd and given naproxen but has not helped.  No vomiting or diarrhea

## 2015-12-17 NOTE — ED Provider Notes (Signed)
CSN: [id number]     Arrival date & time 12/17/15  0501 History   First MD Initiated Contact with Patient 12/17/15 [phone]     Chief Complaint  Patient presents with  . Abdominal Pain     (Consider location/radiation/quality/duration/timing/severity/associated sxs/prior Treatment) HPI Comments: Patient presents to the emergency for evaluation of abdominal pain. Patient reports that she has had this pain previously. Pain is constant, diffuse over the abdomen. She has not identified any alleviating or exacerbating factors. She has tried Naprosyn without improvement. There is no associated nausea, vomiting or diarrhea.  Patient is a 59 y.o. female presenting with abdominal pain.  Abdominal Pain   Past Medical History  Diagnosis Date  . COPD (chronic obstructive pulmonary disease) ([place])   . Hypertension   . Headache   . Chronic back pain   . Normal cardiac stress test 12/2014   Past Surgical History  Procedure Laterality Date  . Abdominal hysterectomy    . Cholecystectomy     No family history on file. Social History  Substance Use Topics  . Smoking status: Current Every Day Smoker -- 0.50 packs/day    Types: Cigarettes  . Smokeless tobacco: None  . Alcohol Use: No   OB History    Gravida Para Term Preterm AB TAB SAB Ectopic Multiple Living            3     Review of Systems  Gastrointestinal: Positive for abdominal pain.  All other systems reviewed and are negative.     Allergies  Review of patient's allergies indicates no known allergies.  Home Medications   Prior to Admission medications   Medication Sig Start Date End Date Taking? Authorizing Provider  metoprolol tartrate (LOPRESSOR) 12.5 mg TABS tablet Take 0.5 tablets (12.5 mg total) by mouth 2 (two) times daily. 12/13/14  Yes Nita Sells, MD  naproxen (NAPROSYN) 500 MG tablet Take 500 mg by mouth 2 (two) times daily with a meal.   Yes Historical Provider, MD  atorvastatin (LIPITOR) 20 MG tablet Take 1  tablet (20 mg total) by mouth daily. Patient not taking: Reported on 11/20/2015 12/23/14   Fay Records, MD  HYDROcodone-acetaminophen (NORCO/VICODIN) 5-325 MG per tablet 1 or 2 tabs PO q6 hours prn pain Patient not taking: Reported on 11/18/2015 02/17/15   Francine Graven, DO  HYDROcodone-acetaminophen (NORCO/VICODIN) 5-325 MG tablet Take 1-2 tablets by mouth every 6 (six) hours as needed. Patient not taking: Reported on 11/20/2015 11/18/15   Fredia Sorrow, MD  lisinopril (PRINIVIL,ZESTRIL) 10 MG tablet Take 1 tablet (10 mg total) by mouth daily. 12/13/14   Nita Sells, MD  ondansetron (ZOFRAN ODT) 4 MG disintegrating tablet Take 1 tablet (4 mg total) by mouth every 8 (eight) hours as needed. Patient not taking: Reported on 11/20/2015 11/18/15   Fredia Sorrow, MD  promethazine (PHENERGAN) 25 MG tablet Take 1 tablet (25 mg total) by mouth every 8 (eight) hours as needed for nausea or vomiting. 11/20/15   Virgel Manifold, MD   BP 168/100 mmHg  Pulse 58  Temp(Src) 97.8 F (36.6 C) (Oral)  Resp 20  Ht 5\' 2"  (1.575 m)  Wt 130 lb (58.968 kg)  BMI 23.77 kg/m2  SpO2 99% Physical Exam  Constitutional: She is oriented to person, place, and time. She appears well-developed and well-nourished. No distress.  HENT:  Head: Normocephalic and atraumatic.  Right Ear: Hearing normal.  Left Ear: Hearing normal.  Nose: Nose normal.  Mouth/Throat: Oropharynx is clear and moist and mucous membranes  are normal.  Eyes: Conjunctivae and EOM are normal. Pupils are equal, round, and reactive to light.  Neck: Normal range of motion. Neck supple.  Cardiovascular: Regular rhythm, S1 normal and S2 normal.  Exam reveals no gallop and no friction rub.   No murmur heard. Pulmonary/Chest: Effort normal and breath sounds normal. No respiratory distress. She exhibits no tenderness.  Abdominal: Soft. Normal appearance and bowel sounds are normal. There is no hepatosplenomegaly. There is generalized tenderness. There is  no rebound, no guarding, no tenderness at McBurney's point and negative Murphy's sign. No hernia.  Musculoskeletal: Normal range of motion.  Neurological: She is alert and oriented to person, place, and time. She has normal strength. No cranial nerve deficit or sensory deficit. Coordination normal. GCS eye subscore is 4. GCS verbal subscore is 5. GCS motor subscore is 6.  Skin: Skin is warm, dry and intact. No rash noted. No cyanosis.  Psychiatric: She has a normal mood and affect. Her speech is normal and behavior is normal. Thought content normal.  Nursing note and vitals reviewed.   ED Course  Procedures (including critical care time) Labs Review Labs Reviewed  COMPREHENSIVE METABOLIC PANEL - Abnormal; Notable for the following:    Glucose, Bld 103 (*)    Calcium 8.8 (*)    Anion gap 4 (*)    All other components within normal limits  CBC WITH DIFFERENTIAL/PLATELET  LIPASE, BLOOD  URINALYSIS, ROUTINE W REFLEX MICROSCOPIC (NOT AT [facility])    Imaging Review No results found. I have personally reviewed and evaluated these images and lab results as part of my medical decision-making.   EKG Interpretation None      MDM   Final diagnoses:  Chronic abdominal pain    Patient has been seen in this ER for abdominal pain multiple times in the past. Patient has a benign abdominal exam. She has mild diffuse tenderness but there is no sign of acute surgical process. Lab work unremarkable. X-ray does not show evidence of obstruction. Patient treated with analgesia and will be discharged to follow-up with her primary doctor.    Orpah Greek, MD 12/17/15 [phone]

## 2016-02-17 DIAGNOSIS — R634 Abnormal weight loss: Secondary | ICD-10-CM | POA: Diagnosis not present

## 2016-02-17 DIAGNOSIS — R197 Diarrhea, unspecified: Secondary | ICD-10-CM | POA: Diagnosis not present

## 2016-04-11 DIAGNOSIS — I10 Essential (primary) hypertension: Secondary | ICD-10-CM | POA: Diagnosis not present

## 2016-04-11 DIAGNOSIS — N390 Urinary tract infection, site not specified: Secondary | ICD-10-CM | POA: Diagnosis not present

## 2016-06-23 ENCOUNTER — Emergency Department (HOSPITAL_COMMUNITY)
Admission: EM | Admit: 2016-06-23 | Discharge: 2016-06-23 | Disposition: A | Discharge status: Home / Self Care | Payer: Medicare Other | Attending: Emergency Medicine | Admitting: Emergency Medicine

## 2016-06-23 ENCOUNTER — Encounter (HOSPITAL_COMMUNITY): Payer: Self-pay | Admitting: Emergency Medicine

## 2016-06-23 ENCOUNTER — Emergency Department (HOSPITAL_COMMUNITY): Payer: Medicare Other

## 2016-06-23 DIAGNOSIS — I10 Essential (primary) hypertension: Secondary | ICD-10-CM | POA: Insufficient documentation

## 2016-06-23 DIAGNOSIS — F17210 Nicotine dependence, cigarettes, uncomplicated: Secondary | ICD-10-CM | POA: Diagnosis not present

## 2016-06-23 DIAGNOSIS — J449 Chronic obstructive pulmonary disease, unspecified: Secondary | ICD-10-CM | POA: Insufficient documentation

## 2016-06-23 DIAGNOSIS — M659 Synovitis and tenosynovitis, unspecified: Secondary | ICD-10-CM

## 2016-06-23 DIAGNOSIS — M65842 Other synovitis and tenosynovitis, left hand: Secondary | ICD-10-CM | POA: Insufficient documentation

## 2016-06-23 DIAGNOSIS — Z79899 Other long term (current) drug therapy: Secondary | ICD-10-CM | POA: Diagnosis not present

## 2016-06-23 DIAGNOSIS — M7989 Other specified soft tissue disorders: Secondary | ICD-10-CM | POA: Diagnosis not present

## 2016-06-23 DIAGNOSIS — M79645 Pain in left finger(s): Secondary | ICD-10-CM | POA: Diagnosis not present

## 2016-06-23 DIAGNOSIS — M6588 Other synovitis and tenosynovitis, other site: Secondary | ICD-10-CM | POA: Diagnosis not present

## 2016-06-23 MED ORDER — TRAMADOL HCL 50 MG PO TABS: 50.0000 mg | ORAL_TABLET | Freq: Four times a day (QID) | ORAL | Status: DC | PRN

## 2016-06-23 MED ORDER — NAPROXEN 500 MG PO TABS: 500.0000 mg | ORAL_TABLET | Freq: Two times a day (BID) | ORAL | Status: DC

## 2016-06-23 MED ORDER — IBUPROFEN 800 MG PO TABS
800.0000 mg | ORAL_TABLET | Freq: Once | ORAL | Status: AC
  Administered 2016-06-23: 800 mg via ORAL
  Filled 2016-06-23: qty 1

## 2016-06-23 NOTE — Discharge Instructions (Signed)
Tendinitis and Tenosynovitis  °Tendinitis is inflammation of the tendon. Tenosynovitis is inflammation of the lining around the tendon (tendon sheath). These painful conditions often occur at once. Tendons attach muscle to bone. To move a limb, force from the muscle moves through the tendon, to the bone. These conditions often cause increased pain when moving. Tendinitis may be caused by a small or partial tear in the tendon.  °SYMPTOMS  °· Pain, tenderness, redness, bruising, or swelling at the injury. °· Loss of normal joint movement. °· Pain that gets worse with use of the muscle and joint attached to the tendon. °· Weakness in the tendon, caused by calcium build up that may occur with tendinitis. °· Commonly affected tendons: °¨ Achilles tendon (calf of leg). °¨ Rotator cuff (shoulder joint). °¨ Patellar tendon (kneecap to shin). °¨ Peroneal tendon (ankle). °¨ Posterior tibial tendon (inner ankle). °¨ Biceps tendon (in front of shoulder). °CAUSES  °· Sudden strain on a flexed muscle, muscle overuse, sudden increase or change in activity, vigorous activity. °· Result of a direct hit (less common). °· Poor muscle action (biomechanics). °RISK INCREASES WITH: °· Injury (trauma). °· Too much exercise. °· Sudden change in athletic activity. °· Incorrect exercise form or technique. °· Poor strength and flexibility. °· Not warming-up properly before activity. °· Returning to activity before healing is complete. °PREVENTION  °· Warm-up and stretch properly before activity. °· Maintain physical fitness: °¨ Joint flexibility. °¨ Muscle strength and endurance. °¨ Fitness that increases heart rate. °· Learn and use proper exercise techniques. °· Use rehabilitation exercises to strengthen weak muscles and tendons. °· Ice the tendon after activity, to reduce recurring inflammation. °· Wear proper fitting protective equipment for specific tendons, when indicated. °PROGNOSIS  °When treated properly, can be cured in 6 to 8 weeks.  Recovery may take longer, depending on degree of injury.  °RELATED COMPLICATIONS  °· Re-injury or recurring symptoms. °· Permanent weakness or joint stiffness, if injury is severe and recovery is not completed. °· Delayed healing, if sports are started before healing is complete. °· Tearing apart (rupture) of the inflamed tendon. Tendinitis means the tendon is injured and must recover. °TREATMENT  °Treatment first involves ice, medicine, and rest from aggravating activities. This reduces pain and inflammation. Modifying your activity may be considered to prevent recurring injury. A brace, elastic bandage wrap, splint, cast, or sling may be prescribed to protect the joint for a short period. After that period, strengthening and stretching exercise may help to regain strength and full range of motion. If the condition persists, despite non-surgical treatment, surgery may be recommended to remove the inflamed tendon lining. Corticosteroid injections may be given to reduce inflammation. However, these injections may weaken the tendon and increase your risk for tendon rupture. °MEDICATION  °· If pain medicine is needed, nonsteroidal anti-inflammatory medicines (aspirin and ibuprofen), or other minor pain relievers (acetaminophen), are often recommended. °· Do not take pain medicine for 7 days before surgery. °· Prescription pain relievers are usually prescribed only after surgery. Use only as directed and only as much as you need. °· Ointments applied to the skin may be helpful. °· Corticosteroid injections may be given to reduce inflammation. However, this may increase your risk of a tendon rupture. °HEAT AND COLD °· Cold treatment (icing) relieves pain and reduces inflammation. Cold treatment should be applied for 10 to 15 minutes every 2 to 3 hours, and immediately after activity that aggravates your symptoms. Use ice packs or an ice massage. °· Heat   treatment may be used before performing stretching and strengthening  activities prescribed by your caregiver, physical therapist, or athletic trainer. Use a heat pack or a warm water soak. °SEEK MEDICAL CARE IF:  °· Symptoms get worse or do not improve, despite treatment. °· Pain becomes too much to tolerate. °· You develop numbness or tingling. °· Toes become cold, or toenails become blue, gray, or dark colored. °· New, unexplained symptoms develop. (Drugs used in treatment may produce side effects.) °  °This information is not intended to replace advice given to you by your health care provider. Make sure you discuss any questions you have with your health care provider. °  °Document Released: 12/04/2005 Document Revised: 02/26/2012 Document Reviewed: 03/18/2009 °Elsevier Interactive Patient Education ©2016 [employer] ° °

## 2016-06-23 NOTE — ED Notes (Signed)
Patient transported to X-ray 

## 2016-06-23 NOTE — ED Provider Notes (Signed)
CSN: [place]:[id number]     Arrival date & time 06/23/16  1038 History   First MD Initiated Contact with Patient 06/23/16 1104     Chief Complaint  Patient presents with  . Hand Pain     (Consider location/radiation/quality/duration/timing/severity/associated sxs/prior Treatment) HPI   Virginia Washington is a 60 y.o. female who presents to the Emergency Department complaining of Pain to left index finger for 3 days. She states the pain radiates from her finger into her hand and forearm. Pain is worse with movement and bending her finger. Reports mild swelling of the index finger.  She describes as a constant aching pain. She has not tried any medications or other therapies. She denies known injury, but does state that she uses her hands frequently during her daily activities.  She denies pain to her wrist, redness, open wound, and numbness   Past Medical History  Diagnosis Date  . COPD (chronic obstructive pulmonary disease) ([place])   . Hypertension   . Headache   . Chronic back pain   . Normal cardiac stress test 12/2014   Past Surgical History  Procedure Laterality Date  . Abdominal hysterectomy    . Cholecystectomy     No family history on file. Social History  Substance Use Topics  . Smoking status: Current Every Day Smoker -- 0.50 packs/day    Types: Cigarettes  . Smokeless tobacco: None  . Alcohol Use: No   OB History    Gravida Para Term Preterm AB TAB SAB Ectopic Multiple Living            3     Review of Systems  Constitutional: Negative for fever and chills.  Musculoskeletal: Positive for joint swelling and arthralgias (Left index finger pain).  Skin: Negative for color change and wound.  Neurological: Negative for numbness.  All other systems reviewed and are negative.     Allergies  Review of patient's allergies indicates no known allergies.  Home Medications   Prior to Admission medications   Medication Sig Start Date End Date Taking? Authorizing Provider   atorvastatin (LIPITOR) 20 MG tablet Take 1 tablet (20 mg total) by mouth daily. Patient not taking: Reported on 11/20/2015 12/23/14   Fay Records, MD  dicyclomine (BENTYL) 20 MG tablet Take 1 tablet (20 mg total) by mouth 2 (two) times daily. 12/17/15   Orpah Greek, MD  HYDROcodone-acetaminophen (NORCO/VICODIN) 5-325 MG per tablet 1 or 2 tabs PO q6 hours prn pain Patient not taking: Reported on 11/18/2015 02/17/15   Francine Graven, DO  HYDROcodone-acetaminophen (NORCO/VICODIN) 5-325 MG tablet Take 1-2 tablets by mouth every 6 (six) hours as needed. Patient not taking: Reported on 11/20/2015 11/18/15   Fredia Sorrow, MD  lisinopril (PRINIVIL,ZESTRIL) 10 MG tablet Take 1 tablet (10 mg total) by mouth daily. 12/13/14   Nita Sells, MD  metoprolol tartrate (LOPRESSOR) 12.5 mg TABS tablet Take 0.5 tablets (12.5 mg total) by mouth 2 (two) times daily. 12/13/14   Nita Sells, MD  naproxen (NAPROSYN) 500 MG tablet Take 500 mg by mouth 2 (two) times daily with a meal.    Historical Provider, MD  ondansetron (ZOFRAN ODT) 4 MG disintegrating tablet Take 1 tablet (4 mg total) by mouth every 8 (eight) hours as needed. Patient not taking: Reported on 11/20/2015 11/18/15   Fredia Sorrow, MD  promethazine (PHENERGAN) 25 MG tablet Take 1 tablet (25 mg total) by mouth every 8 (eight) hours as needed for nausea or vomiting. 11/20/15   Virgel Manifold,  MD  ranitidine (ZANTAC) 150 MG tablet Take 1 tablet (150 mg total) by mouth 2 (two) times daily. 12/17/15   Orpah Greek, MD   BP 144/79 mmHg  Pulse 72  Temp(Src) 98.6 F (37 C) (Oral)  Resp 18  Ht 5\' 2"  (1.575 m)  Wt 58.968 kg  BMI 23.77 kg/m2  SpO2 97% Physical Exam  Constitutional: She is oriented to person, place, and time. She appears well-developed and well-nourished. No distress.  HENT:  Head: Normocephalic.  Cardiovascular: Normal rate and regular rhythm.   Pulmonary/Chest: Effort normal and breath sounds normal. No  respiratory distress.  Musculoskeletal: She exhibits edema and tenderness.  Diffuse tenderness on range of motion  left index finger.  Mild edema.  No skin lesions or open wounds, no erythema or excessive warmth.  No proximal tenderness on exam,    Neurological: She is alert and oriented to person, place, and time.  Skin: Skin is warm. No rash noted. No erythema.  Psychiatric: She has a normal mood and affect.  Nursing note and vitals reviewed.   ED Course  Procedures (including critical care time) Labs Review Labs Reviewed - No data to display  Imaging Review Dg Hand Complete Left  06/23/2016  CLINICAL DATA:  Left hand pain and swelling for 3 days, no known injury, initial encounter EXAM: LEFT HAND - COMPLETE 3+ VIEW COMPARISON:  None. FINDINGS: There is no evidence of fracture or dislocation. There is no evidence of arthropathy or other focal bone abnormality. Soft tissues are unremarkable. IMPRESSION: No acute abnormality noted. Electronically Signed   By: Inez Catalina M.D.   On: 06/23/2016 11:24   I have personally reviewed and evaluated these images and lab results as part of my medical decision-making.   EKG Interpretation None      MDM   Final diagnoses:  Tenosynovitis of finger   Finger splint applied, pain improved after medications  Finger splinted for comfort.  No concerning sx's for infectious process.  NV intact.  Likely inflammatory.   Rx for naprosyn and ultram for acute pain.  Referral to orthopedics.  Return precautions given    Kem Parkinson, PA-C 06/23/16 [place], MD 06/23/16 1409

## 2016-06-23 NOTE — ED Notes (Signed)
Onset 3 days ago pain in index finger on left hand, radiating into arm, pain worse with movement and when bending fingers

## 2016-07-30 ENCOUNTER — Encounter (HOSPITAL_COMMUNITY): Payer: Self-pay | Admitting: *Deleted

## 2016-07-30 ENCOUNTER — Emergency Department (HOSPITAL_COMMUNITY)
Admission: EM | Admit: 2016-07-30 | Discharge: 2016-07-30 | Disposition: A | Discharge status: Home / Self Care | Payer: Medicare Other | Attending: Emergency Medicine | Admitting: Emergency Medicine

## 2016-07-30 DIAGNOSIS — S30861A Insect bite (nonvenomous) of abdominal wall, initial encounter: Secondary | ICD-10-CM | POA: Insufficient documentation

## 2016-07-30 DIAGNOSIS — R21 Rash and other nonspecific skin eruption: Secondary | ICD-10-CM | POA: Diagnosis present

## 2016-07-30 DIAGNOSIS — J449 Chronic obstructive pulmonary disease, unspecified: Secondary | ICD-10-CM | POA: Insufficient documentation

## 2016-07-30 DIAGNOSIS — S70361A Insect bite (nonvenomous), right thigh, initial encounter: Secondary | ICD-10-CM | POA: Insufficient documentation

## 2016-07-30 DIAGNOSIS — Y929 Unspecified place or not applicable: Secondary | ICD-10-CM | POA: Insufficient documentation

## 2016-07-30 DIAGNOSIS — Y9389 Activity, other specified: Secondary | ICD-10-CM | POA: Diagnosis not present

## 2016-07-30 DIAGNOSIS — F17210 Nicotine dependence, cigarettes, uncomplicated: Secondary | ICD-10-CM | POA: Diagnosis not present

## 2016-07-30 DIAGNOSIS — I10 Essential (primary) hypertension: Secondary | ICD-10-CM | POA: Insufficient documentation

## 2016-07-30 DIAGNOSIS — S80861A Insect bite (nonvenomous), right lower leg, initial encounter: Secondary | ICD-10-CM | POA: Insufficient documentation

## 2016-07-30 DIAGNOSIS — Y999 Unspecified external cause status: Secondary | ICD-10-CM | POA: Diagnosis not present

## 2016-07-30 DIAGNOSIS — W57XXXA Bitten or stung by nonvenomous insect and other nonvenomous arthropods, initial encounter: Secondary | ICD-10-CM | POA: Insufficient documentation

## 2016-07-30 MED ORDER — HYDROXYZINE PAMOATE 25 MG PO CAPS: 25.0000 mg | ORAL_CAPSULE | Freq: Four times a day (QID) | ORAL | 0 refills | Status: DC | PRN

## 2016-07-30 MED ORDER — KETOROLAC TROMETHAMINE 10 MG PO TABS
10.0000 mg | ORAL_TABLET | Freq: Once | ORAL | Status: AC
  Administered 2016-07-30: 10 mg via ORAL
  Filled 2016-07-30: qty 1

## 2016-07-30 MED ORDER — PREDNISONE 50 MG PO TABS
60.0000 mg | ORAL_TABLET | Freq: Once | ORAL | Status: AC
  Administered 2016-07-30: 60 mg via ORAL
  Filled 2016-07-30: qty 1

## 2016-07-30 MED ORDER — DEXAMETHASONE 4 MG PO TABS: 4.0000 mg | ORAL_TABLET | Freq: Two times a day (BID) | ORAL | 0 refills | Status: DC

## 2016-07-30 MED ORDER — FAMOTIDINE 20 MG PO TABS
20.0000 mg | ORAL_TABLET | Freq: Once | ORAL | Status: AC
  Administered 2016-07-30: 20 mg via ORAL
  Filled 2016-07-30: qty 1

## 2016-07-30 MED ORDER — HYDROXYZINE HCL 25 MG PO TABS
25.0000 mg | ORAL_TABLET | Freq: Once | ORAL | Status: AC
  Administered 2016-07-30: 25 mg via ORAL
  Filled 2016-07-30: qty 1

## 2016-07-30 NOTE — ED Provider Notes (Signed)
[place]ia Washington Provider Note   CSN: [id number] Arrival date & time: 07/30/16  2121  First Provider Contact:  None       History   Chief Complaint Chief Complaint  Patient presents with  . Rash    HPI Virginia Washington is a 60 y.o. female.  Patient is a 60 year old female who presents to the emergency department with a complaint of rash.  The patient states that on yesterday she went fishing. Today she notices several bumps on her on her right leg, right thigh, and on her right flank. She notices a few on her left flank. The patient states she has not had any recent change in dryer sheets, she's not had any recent changes in detergent, no change in diet, no change in medication. She's not had this problem before. Nothing makes the pain any better, and nothing makes it any worse.      Past Medical History:  Diagnosis Date  . Chronic back pain   . COPD (chronic obstructive pulmonary disease) ([place])   . Headache   . Hypertension   . Normal cardiac stress test 12/2014    Patient Active Problem List   Diagnosis Date Noted  . Chest pain 12/12/2014  . Hypertension 12/12/2014  . Tobacco abuse 10/29/2011    Past Surgical History:  Procedure Laterality Date  . ABDOMINAL HYSTERECTOMY    . CHOLECYSTECTOMY      OB History    Gravida Para Term Preterm AB Living             3   SAB TAB Ectopic Multiple Live Births                   Home Medications    Prior to Admission medications   Medication Sig Start Date End Date Taking? Authorizing Provider  atorvastatin (LIPITOR) 20 MG tablet Take 1 tablet (20 mg total) by mouth daily. Patient not taking: Reported on 11/20/2015 12/23/14   Fay Records, MD  dicyclomine (BENTYL) 20 MG tablet Take 1 tablet (20 mg total) by mouth 2 (two) times daily. 12/17/15   Orpah Greek, MD  lisinopril (PRINIVIL,ZESTRIL) 10 MG tablet Take 1 tablet (10 mg total) by mouth daily. 12/13/14   Nita Sells, MD  metoprolol tartrate  (LOPRESSOR) 12.5 mg TABS tablet Take 0.5 tablets (12.5 mg total) by mouth 2 (two) times daily. 12/13/14   Nita Sells, MD  naproxen (NAPROSYN) 500 MG tablet Take 1 tablet (500 mg total) by mouth 2 (two) times daily with a meal. 06/23/16   Tammy Triplett, PA-C  promethazine (PHENERGAN) 25 MG tablet Take 1 tablet (25 mg total) by mouth every 8 (eight) hours as needed for nausea or vomiting. 11/20/15   Virgel Manifold, MD  ranitidine (ZANTAC) 150 MG tablet Take 1 tablet (150 mg total) by mouth 2 (two) times daily. 12/17/15   Orpah Greek, MD  traMADol (ULTRAM) 50 MG tablet Take 1 tablet (50 mg total) by mouth every 6 (six) hours as needed. 06/23/16   Tammy Triplett, PA-C    Family History No family history on file.  Social History Social History  Substance Use Topics  . Smoking status: Current Every Day Smoker    Packs/day: 0.50    Types: Cigarettes  . Smokeless tobacco: Never Used  . Alcohol use No     Allergies   Review of patient's allergies indicates no known allergies.   Review of Systems Review of Systems  Musculoskeletal: Positive for back pain.  All other systems reviewed and are negative.    Physical Exam Updated Vital Signs BP 144/79 (BP Location: Left Arm)   Pulse 75   Temp 98.9 F (37.2 C) (Oral)   Resp 18   Ht 5\' 2"  (1.575 m)   Wt 59 kg   SpO2 99%   BMI 23.78 kg/m   Physical Exam  Constitutional: She is oriented to person, place, and time. She appears well-developed and well-nourished.  Non-toxic appearance.  HENT:  Head: Normocephalic.  Right Ear: Tympanic membrane and external ear normal.  Left Ear: Tympanic membrane and external ear normal.  Eyes: EOM and lids are normal. Pupils are equal, round, and reactive to light.  Neck: Normal range of motion. Neck supple. Carotid bruit is not present.  Cardiovascular: Normal rate, regular rhythm, normal heart sounds, intact distal pulses and normal pulses.   Pulmonary/Chest: Breath sounds normal. No  respiratory distress.  Abdominal: Soft. Bowel sounds are normal. There is no tenderness. There is no guarding.  Musculoskeletal: Normal range of motion.  Lymphadenopathy:       Head (right side): No submandibular adenopathy present.       Head (left side): No submandibular adenopathy present.    She has no cervical adenopathy.  Neurological: She is alert and oriented to person, place, and time. She has normal strength. No cranial nerve deficit or sensory deficit.  Skin: Skin is warm and dry. Rash noted.  There are multiple red raised bumps of the right flank, extending to the back. There are a few on the left flank. There are red raised bumps on the right thigh. There is also a red raised bumps on the right calf. There is no red streaks appreciated. Is no drainage noted.  Psychiatric: She has a normal mood and affect. Her speech is normal.  Nursing note and vitals reviewed.    ED Treatments / Results  Labs (all labs ordered are listed, but only abnormal results are displayed) Labs Reviewed - No data to display  EKG  EKG Interpretation None       Radiology No results found.  Procedures Procedures (including critical care time)  Medications Ordered in ED Medications - No data to display   Initial Impression / Assessment and Plan / ED Course  I have reviewed the triage vital signs and the nursing notes.  Pertinent labs & imaging results that were available during my care of the patient were reviewed by me and considered in my medical decision making (see chart for details).  Clinical Course    **I have reviewed nursing notes, vital signs, and all appropriate lab and imaging results for this patient.*  Final Clinical Impressions(s) / ED Diagnoses  Patient has multiple insect bites involving multiple areas on. Patient will be treated with steroid medication, as well as antihistamine medication. Patient to follow with Dr. Legrand Rams, or return to the emergency department if not  improving.    Final diagnoses:  None    New Prescriptions New Prescriptions   No medications on file     Lily Kocher, Hershal Coria 07/30/16 2159    Nat Christen, MD 07/31/16 1436

## 2016-07-30 NOTE — ED Triage Notes (Signed)
Pt c/o rash that started today,

## 2016-07-30 NOTE — Discharge Instructions (Signed)
Please soak in a tub of warm Epsom salt water. Use Vistaril every 6 hours for itching and burning. Use Decadron 2 times daily with food. Please see Dr. Legrand Rams for additional evaluation and management if not improving.

## 2016-07-30 NOTE — ED Notes (Signed)
Patient states that she has a ride home

## 2016-08-08 DIAGNOSIS — I10 Essential (primary) hypertension: Secondary | ICD-10-CM | POA: Diagnosis not present

## 2016-08-08 DIAGNOSIS — L259 Unspecified contact dermatitis, unspecified cause: Secondary | ICD-10-CM | POA: Diagnosis not present

## 2016-08-16 ENCOUNTER — Encounter (HOSPITAL_COMMUNITY): Payer: Self-pay

## 2016-08-16 ENCOUNTER — Emergency Department (HOSPITAL_COMMUNITY): Payer: Medicare Other

## 2016-08-16 ENCOUNTER — Inpatient Hospital Stay (HOSPITAL_COMMUNITY)
Admission: EM | Admit: 2016-08-16 | Discharge: 2016-08-20 | DRG: 390 | Disposition: A | Discharge status: Home / Self Care | Payer: Medicare Other | Attending: Pulmonary Disease | Admitting: Pulmonary Disease

## 2016-08-16 DIAGNOSIS — K56609 Unspecified intestinal obstruction, unspecified as to partial versus complete obstruction: Secondary | ICD-10-CM

## 2016-08-16 DIAGNOSIS — Z720 Tobacco use: Secondary | ICD-10-CM | POA: Diagnosis present

## 2016-08-16 DIAGNOSIS — K56600 Partial intestinal obstruction, unspecified as to cause: Secondary | ICD-10-CM | POA: Diagnosis present

## 2016-08-16 DIAGNOSIS — J449 Chronic obstructive pulmonary disease, unspecified: Secondary | ICD-10-CM | POA: Diagnosis present

## 2016-08-16 DIAGNOSIS — E785 Hyperlipidemia, unspecified: Secondary | ICD-10-CM | POA: Diagnosis present

## 2016-08-16 DIAGNOSIS — R1084 Generalized abdominal pain: Secondary | ICD-10-CM | POA: Diagnosis not present

## 2016-08-16 DIAGNOSIS — G8929 Other chronic pain: Secondary | ICD-10-CM | POA: Diagnosis present

## 2016-08-16 DIAGNOSIS — D72829 Elevated white blood cell count, unspecified: Secondary | ICD-10-CM

## 2016-08-16 DIAGNOSIS — K5669 Other intestinal obstruction: Secondary | ICD-10-CM

## 2016-08-16 DIAGNOSIS — M549 Dorsalgia, unspecified: Secondary | ICD-10-CM | POA: Diagnosis present

## 2016-08-16 DIAGNOSIS — Z8249 Family history of ischemic heart disease and other diseases of the circulatory system: Secondary | ICD-10-CM

## 2016-08-16 DIAGNOSIS — I10 Essential (primary) hypertension: Secondary | ICD-10-CM | POA: Diagnosis present

## 2016-08-16 DIAGNOSIS — R109 Unspecified abdominal pain: Secondary | ICD-10-CM | POA: Diagnosis not present

## 2016-08-16 DIAGNOSIS — F17210 Nicotine dependence, cigarettes, uncomplicated: Secondary | ICD-10-CM | POA: Diagnosis present

## 2016-08-16 HISTORY — DX: Hyperlipidemia, unspecified: E78.5

## 2016-08-16 LAB — CBC
HCT: 43.4 % (ref 36.0–46.0)
HEMOGLOBIN: 13.9 g/dL (ref 12.0–15.0)
MCH: 27.9 pg (ref 26.0–34.0)
MCHC: 32 g/dL (ref 30.0–36.0)
MCV: 87.1 fL (ref 78.0–100.0)
PLATELETS: 189 10*3/uL (ref 150–400)
RBC: 4.98 MIL/uL (ref 3.87–5.11)
RDW: 15.2 % (ref 11.5–15.5)
WBC: 14.7 10*3/uL — ABNORMAL HIGH (ref 4.0–10.5)

## 2016-08-16 LAB — URINALYSIS, ROUTINE W REFLEX MICROSCOPIC
BILIRUBIN URINE: NEGATIVE
Glucose, UA: NEGATIVE mg/dL
HGB URINE DIPSTICK: NEGATIVE
Ketones, ur: NEGATIVE mg/dL
Leukocytes, UA: NEGATIVE
Nitrite: NEGATIVE
PROTEIN: NEGATIVE mg/dL
Specific Gravity, Urine: 1.01 (ref 1.005–1.030)
pH: 7.5 (ref 5.0–8.0)

## 2016-08-16 LAB — COMPREHENSIVE METABOLIC PANEL
ALK PHOS: 72 U/L (ref 38–126)
ALT: 19 U/L (ref 14–54)
ANION GAP: 9 (ref 5–15)
AST: 14 U/L — ABNORMAL LOW (ref 15–41)
Albumin: 3.6 g/dL (ref 3.5–5.0)
BILIRUBIN TOTAL: 0.7 mg/dL (ref 0.3–1.2)
BUN: 13 mg/dL (ref 6–20)
CALCIUM: 9.4 mg/dL (ref 8.9–10.3)
CO2: 28 mmol/L (ref 22–32)
Chloride: 105 mmol/L (ref 101–111)
Creatinine, Ser: 0.87 mg/dL (ref 0.44–1.00)
GFR calc non Af Amer: >60 mL/min (ref >60)
Glucose, Bld: 90 mg/dL (ref 65–99)
Potassium: 3.7 mmol/L (ref 3.5–5.1)
SODIUM: 142 mmol/L (ref 135–145)
TOTAL PROTEIN: 6.3 g/dL — AB (ref 6.5–8.1)

## 2016-08-16 LAB — LIPASE, BLOOD: Lipase: 37 U/L (ref 11–51)

## 2016-08-16 MED ORDER — METOPROLOL TARTRATE 25 MG PO TABS
25.0000 mg | ORAL_TABLET | Freq: Two times a day (BID) | ORAL | Status: DC
  Administered 2016-08-17 – 2016-08-20 (×6): 25 mg via ORAL
  Filled 2016-08-16 (×7): qty 1

## 2016-08-16 MED ORDER — ATORVASTATIN CALCIUM 20 MG PO TABS
20.0000 mg | ORAL_TABLET | Freq: Every day | ORAL | Status: DC
  Administered 2016-08-17 – 2016-08-20 (×4): 20 mg via ORAL
  Filled 2016-08-16 (×5): qty 1

## 2016-08-16 MED ORDER — ACETAMINOPHEN 650 MG RE SUPP: 650.0000 mg | Freq: Four times a day (QID) | RECTAL | Status: DC | PRN

## 2016-08-16 MED ORDER — HYDROMORPHONE HCL 1 MG/ML IJ SOLN
1.0000 mg | Freq: Once | INTRAMUSCULAR | Status: AC
  Administered 2016-08-16: 1 mg via INTRAVENOUS
  Filled 2016-08-16: qty 1

## 2016-08-16 MED ORDER — SODIUM CHLORIDE 0.9 % IV SOLN
Freq: Once | INTRAVENOUS | Status: AC
  Administered 2016-08-16: 20:00:00 via INTRAVENOUS

## 2016-08-16 MED ORDER — ACETAMINOPHEN 325 MG PO TABS: 650.0000 mg | ORAL_TABLET | Freq: Four times a day (QID) | ORAL | Status: DC | PRN

## 2016-08-16 MED ORDER — ONDANSETRON HCL 4 MG/2ML IJ SOLN
4.0000 mg | Freq: Once | INTRAMUSCULAR | Status: AC
  Administered 2016-08-16: 4 mg via INTRAVENOUS
  Filled 2016-08-16: qty 2

## 2016-08-16 MED ORDER — ONDANSETRON HCL 4 MG PO TABS: 4.0000 mg | ORAL_TABLET | Freq: Four times a day (QID) | ORAL | Status: DC | PRN

## 2016-08-16 MED ORDER — ENOXAPARIN SODIUM 40 MG/0.4ML ~~LOC~~ SOLN
40.0000 mg | SUBCUTANEOUS | Status: DC
  Administered 2016-08-16 – 2016-08-19 (×4): 40 mg via SUBCUTANEOUS
  Filled 2016-08-16 (×4): qty 0.4

## 2016-08-16 MED ORDER — MORPHINE SULFATE (PF) 2 MG/ML IV SOLN
2.0000 mg | INTRAVENOUS | Status: DC | PRN
  Administered 2016-08-17 – 2016-08-19 (×3): 2 mg via INTRAVENOUS
  Filled 2016-08-16 (×3): qty 1

## 2016-08-16 MED ORDER — LACTATED RINGERS IV SOLN
INTRAVENOUS | Status: DC
  Administered 2016-08-16 – 2016-08-19 (×4): via INTRAVENOUS

## 2016-08-16 MED ORDER — IOPAMIDOL (ISOVUE-300) INJECTION 61%
INTRAVENOUS | Status: AC
  Filled 2016-08-16: qty 30

## 2016-08-16 MED ORDER — IOPAMIDOL (ISOVUE-300) INJECTION 61%
100.0000 mL | Freq: Once | INTRAVENOUS | Status: AC | PRN
  Administered 2016-08-16: 100 mL via INTRAVENOUS

## 2016-08-16 MED ORDER — ONDANSETRON HCL 4 MG/2ML IJ SOLN: 4.0000 mg | Freq: Four times a day (QID) | INTRAMUSCULAR | Status: DC | PRN

## 2016-08-16 NOTE — ED Triage Notes (Signed)
Pt. Came in via [facility] with complaints of nausea, vomiting, and abdominal distension. Pt. Reports that she took a nap today and woke up with this pain. Denies fever. CBG 151 from EMS. 4 mg zofran given IV via EMS.

## 2016-08-16 NOTE — ED Provider Notes (Signed)
[facility] DEPT Provider Note   CSN: [id number] Arrival date & time: 08/16/16  1636     History   Chief Complaint Chief Complaint  Patient presents with  . Abdominal Pain  . Emesis    HPI Virginia Washington is a 60 y.o. female.  The history is provided by the patient and medical records.  Abdominal Pain   This is a new problem. The current episode started 1 to 2 hours ago. The problem occurs constantly. The problem has been gradually worsening. The pain is associated with an unknown factor. The pain is located in the generalized abdominal region. The quality of the pain is aching, cramping and throbbing. The pain is at a severity of 10/10. The pain is severe. Associated symptoms include belching, nausea and vomiting. Pertinent negatives include anorexia, fever, diarrhea, dysuria and headaches. The symptoms are aggravated by palpation and activity. Nothing relieves the symptoms. Past workup does not include GI consult or CT scan. Past medical history comments: History of abdominal hysterectomy.    Past Medical History:  Diagnosis Date  . Chronic back pain   . COPD (chronic obstructive pulmonary disease) ([place])   . Headache   . Hyperlipidemia   . Hypertension   . Normal cardiac stress test 12/2014    Patient Active Problem List   Diagnosis Date Noted  . SBO (small bowel obstruction) ([place]) 08/16/2016  . Hyperlipidemia 08/16/2016  . Chest pain 12/12/2014  . Hypertension 12/12/2014  . Tobacco abuse 10/29/2011    Past Surgical History:  Procedure Laterality Date  . ABDOMINAL HYSTERECTOMY    . CHOLECYSTECTOMY      OB History    Gravida Para Term Preterm AB Living             3   SAB TAB Ectopic Multiple Live Births                   Home Medications    Prior to Admission medications   Medication Sig Start Date End Date Taking? Authorizing Provider  atorvastatin (LIPITOR) 20 MG tablet Take 1 tablet (20 mg total) by mouth daily. 12/23/14  Yes Fay Records, MD    hydrOXYzine (VISTARIL) 25 MG capsule Take 1 capsule (25 mg total) by mouth every 6 (six) hours as needed. 07/30/16  Yes Lily Kocher, PA-C  metoprolol tartrate (LOPRESSOR) 12.5 mg TABS tablet Take 0.5 tablets (12.5 mg total) by mouth 2 (two) times daily. Patient taking differently: Take 25 mg by mouth 2 (two) times daily.  12/13/14  Yes Nita Sells, MD    Family History Family History  Problem Relation Age of Onset  . Hypertension Mother     Social History Social History  Substance Use Topics  . Smoking status: Current Every Day Smoker    Packs/day: 0.50    Years: 40.00    Types: Cigarettes  . Smokeless tobacco: Never Used  . Alcohol use No     Allergies   Review of patient's allergies indicates no known allergies.   Review of Systems Review of Systems  Constitutional: Negative for chills and fever.  HENT: Negative for congestion, rhinorrhea and sore throat.   Eyes: Negative for visual disturbance.  Respiratory: Negative for cough, shortness of breath and wheezing.   Cardiovascular: Negative for chest pain and leg swelling.  Gastrointestinal: Positive for abdominal distention, abdominal pain, nausea and vomiting. Negative for anorexia and diarrhea.  Genitourinary: Negative for dysuria, flank pain, vaginal bleeding and vaginal discharge.  Musculoskeletal: Negative for neck  pain.  Skin: Negative for rash.  Allergic/Immunologic: Negative for immunocompromised state.  Neurological: Negative for syncope and headaches.  Hematological: Does not bruise/bleed easily.  All other systems reviewed and are negative.    Physical Exam Updated Vital Signs BP (!) 172/82 (BP Location: Left Arm)   Pulse (!) 49   Temp 98 F (36.7 C) (Oral)   Resp 20   Ht 5\' 2"  (1.575 m)   Wt 130 lb (59 kg)   SpO2 96%   BMI 23.78 kg/m   Physical Exam  Constitutional: She is oriented to person, place, and time. She appears well-developed and well-nourished. No distress.  HENT:  Head:  Normocephalic and atraumatic.  Eyes: Conjunctivae are normal.  Neck: Neck supple.  Cardiovascular: Normal rate, regular rhythm and normal heart sounds.  Exam reveals no friction rub.   No murmur heard. Pulmonary/Chest: Effort normal and breath sounds normal. No respiratory distress. She has no wheezes. She has no rales.  Abdominal: Soft. Normal appearance. She exhibits distension. Bowel sounds are increased. There is generalized tenderness. There is guarding. There is no rigidity and no rebound.  Musculoskeletal: She exhibits no edema.  Neurological: She is alert and oriented to person, place, and time. She exhibits normal muscle tone.  Skin: Skin is warm. Capillary refill takes less than 2 seconds.  Psychiatric: She has a normal mood and affect.  Nursing note and vitals reviewed.    ED Treatments / Results  Labs (all labs ordered are listed, but only abnormal results are displayed) Labs Reviewed  COMPREHENSIVE METABOLIC PANEL - Abnormal; Notable for the following:       Result Value   Total Protein 6.3 (*)    AST 14 (*)    All other components within normal limits  CBC - Abnormal; Notable for the following:    WBC 14.7 (*)    All other components within normal limits  LIPASE, BLOOD  URINALYSIS, ROUTINE W REFLEX MICROSCOPIC (NOT AT [facility])  BASIC METABOLIC PANEL  CBC    EKG  EKG Interpretation  Date/Time:  Wednesday August 16 2016 16:38:10 EDT Ventricular Rate:  64 PR Interval:    QRS Duration: 89 QT Interval:  405 QTC Calculation: 418 R Axis:   72 Text Interpretation:  Sinus rhythm Minimal ST elevation, inferior leads Wandering baseline No significant change since last tracing Confirmed by Charlynn Salih MD, Khalidah Herbold [id number]) on 08/16/2016 5:08:45 PM       Radiology Ct Abdomen Pelvis W Contrast  Result Date: 08/16/2016 CLINICAL DATA:  Abdominal distension started today.  Severe pain. EXAM: CT ABDOMEN AND PELVIS WITH CONTRAST TECHNIQUE: Multidetector CT imaging of the abdomen  and pelvis was performed using the standard protocol following bolus administration of intravenous contrast. CONTRAST:  161mL ISOVUE-300 IOPAMIDOL (ISOVUE-300) INJECTION 61% COMPARISON:  11/18/2015 FINDINGS: Lower chest:  No acute findings. Hepatobiliary: No masses or other significant abnormality. Pancreas: No mass, inflammatory changes, or other significant abnormality. Spleen: Within normal limits in size and appearance. Adrenals/Urinary Tract: No masses identified. No evidence of hydronephrosis. Stomach/Bowel: Small bowel dilatation measuring up to 3.4 cm in the mid abdomen with a a relative transition point in the anterior mid abdomen. No pneumatosis, pneumoperitoneum or portal venous gas. No abdominal or pelvic free fluid. Vascular/Lymphatic: No pathologically enlarged lymph nodes. No evidence of abdominal aortic aneurysm. Reproductive: Prior hysterectomy.  No adnexal mass. Other: None. Musculoskeletal:  No suspicious bone lesions identified. IMPRESSION: 1. Small bowel dilatation measuring up to 3.4 cm in the mid abdomen most concerning for small  bowel obstruction with a relative transition point in the anterior mid abdomen. Electronically Signed   By: Kathreen Devoid   On: 08/16/2016 19:12    Procedures Procedures (including critical care time)  Medications Ordered in ED Medications  iopamidol (ISOVUE-300) 61 % injection (not administered)  atorvastatin (LIPITOR) tablet 20 mg (20 mg Oral Not Given 08/16/16 2303)  metoprolol tartrate (LOPRESSOR) tablet 25 mg (25 mg Oral Not Given 08/16/16 2304)  enoxaparin (LOVENOX) injection 40 mg (40 mg Subcutaneous Given 08/16/16 2303)  acetaminophen (TYLENOL) tablet 650 mg (not administered)    Or  acetaminophen (TYLENOL) suppository 650 mg (not administered)  ondansetron (ZOFRAN) tablet 4 mg (not administered)    Or  ondansetron (ZOFRAN) injection 4 mg (not administered)  lactated ringers infusion ( Intravenous New Bag/Given 08/16/16 2303)  morphine 2 MG/ML  injection 2 mg (not administered)  HYDROmorphone (DILAUDID) injection 1 mg (1 mg Intravenous Given 08/16/16 1713)  ondansetron (ZOFRAN) injection 4 mg (4 mg Intravenous Given 08/16/16 1713)  iopamidol (ISOVUE-300) 61 % injection 100 mL (100 mLs Intravenous Contrast Given 08/16/16 1828)  HYDROmorphone (DILAUDID) injection 1 mg (1 mg Intravenous Given 08/16/16 1957)  0.9 %  sodium chloride infusion ( Intravenous Stopped 08/16/16 2045)     Initial Impression / Assessment and Plan / ED Course  I have reviewed the triage vital signs and the nursing notes.  Pertinent labs & imaging results that were available during my care of the patient were reviewed by me and considered in my medical decision making (see chart for details).  Clinical Course   60 yo F who presents with diffuse abdominal pain, distension, vomiting, and constipation. See HPI above. On arrival, VSS but pt in moderate distress due to pain. Exam is as above. Primary consideration includes acute SBO. No signs of peritonitis, fever, hypotension, or other evidence to suggest perforation. Will check labs, CT, re-assess. No CP, EKG unremarkable.  Labs show moderate leukocytosis but are o/w unremarkable. Renal function at baseline. CT A/P shows SBO. No signs of closed loop. Discussed with Dr. Rosana Hoes of general surgery. Will admit to hospitalist for fluids, pain control. NGT placed at bedside. Will admit.  Final Clinical Impressions(s) / ED Diagnoses   Final diagnoses:  SBO (small bowel obstruction) ([place])  Leukocytosis    New Prescriptions Current Discharge Medication List       Duffy Bruce, MD 08/16/16 2329

## 2016-08-16 NOTE — H&P (Signed)
History and Physical    MAKALEE DELICH [id number] DOB: [birth year] DOA: 08/16/2016  PCP: [name], MD Consultants:  None Patient coming from: home - lives with grandson; NOK: Darene Lamer, [phone]  Chief Complaint: abdominal pain  HPI: GENEVEA SIELOFF is a 60 y.o. female with medical history significant of HTN, HLD, and COPD presenting with acute onset of severe abdominal pain.  Patient cleaned someone's house this morning and went home to lay down and went to sleep.  When she woke up she noticed severe abdominal pain and distention.  Felt fine this AM.  N/V x 2 since the pain started.  Last BM was normal yesterday.  No flatus - did pass some earlier this evening.  No fevers.  Partial abdominal hysterectomy and cholecystectomy in the past.  No h/o SBO in the past.   ED Course: CT scan showed SBO.  NGT placed.  Review of Systems: As per HPI; otherwise 10 point review of systems reviewed and negative.   Ambulatory Status:  Walks independently  Past Medical History:  Diagnosis Date  . Chronic back pain   . COPD (chronic obstructive pulmonary disease) ([place])   . Headache   . Hyperlipidemia   . Hypertension   . Normal cardiac stress test 12/2014    Past Surgical History:  Procedure Laterality Date  . ABDOMINAL HYSTERECTOMY    . CHOLECYSTECTOMY      Social History   Social History  . Marital status: Widowed    Spouse name: N/A  . Number of children: N/A  . Years of education: N/A   Occupational History  . unemployed    Social History Main Topics  . Smoking status: Current Every Day Smoker    Packs/day: 0.50    Years: 40.00    Types: Cigarettes  . Smokeless tobacco: Never Used  . Alcohol use No  . Drug use: No  . Sexual activity: No   Other Topics Concern  . Not on file   Social History Narrative  . No narrative on file    No Known Allergies  Family History  Problem Relation Age of Onset  . Hypertension Mother     Prior to Admission  medications   Medication Sig Start Date End Date Taking? Authorizing Provider  atorvastatin (LIPITOR) 20 MG tablet Take 1 tablet (20 mg total) by mouth daily. 12/23/14  Yes Fay Records, MD  hydrOXYzine (VISTARIL) 25 MG capsule Take 1 capsule (25 mg total) by mouth every 6 (six) hours as needed. 07/30/16  Yes Lily Kocher, PA-C  metoprolol tartrate (LOPRESSOR) 12.5 mg TABS tablet Take 0.5 tablets (12.5 mg total) by mouth 2 (two) times daily. Patient taking differently: Take 25 mg by mouth 2 (two) times daily.  12/13/14  Yes Nita Sells, MD  dexamethasone (DECADRON) 4 MG tablet Take 1 tablet (4 mg total) by mouth 2 (two) times daily with a meal. Patient not taking: Reported on 08/16/2016 07/30/16   Lily Kocher, PA-C  naproxen (NAPROSYN) 500 MG tablet Take 1 tablet (500 mg total) by mouth 2 (two) times daily with a meal. Patient not taking: Reported on 08/16/2016 06/23/16   Tammy Triplett, PA-C  traMADol (ULTRAM) 50 MG tablet Take 1 tablet (50 mg total) by mouth every 6 (six) hours as needed. Patient not taking: Reported on 08/16/2016 06/23/16   Kem Parkinson, PA-C    Physical Exam: Vitals:   08/16/16 1641 08/16/16 1642 08/16/16 1934  BP: (!) 180/101  179/92  Pulse: 61  (!) 57  Resp: 20  16  Temp: 97.6 F (36.4 C)    TempSrc: Oral    SpO2: 97%  98%  Weight:  59 kg (130 lb)   Height:  5\' 2"  (1.575 m)      General:  Appears calm and comfortable and is NAD; NG tube is in place; appears older than stated age Eyes:  PERRL, EOMI, normal lids, iris ENT:  grossly normal hearing, lips & tongue, mmm; poor dentition Neck:  no LAD, masses or thyromegaly Cardiovascular:  RRR, no m/r/g. No LE edema.  Respiratory:  CTA bilaterally, no w/r/r. Normal respiratory effort. Abdomen:  soft, diffusely mildly tender, ongoing distention despite NGT, hypoactive but present bowel sounds Skin:  no rash or induration seen on limited exam Musculoskeletal:  grossly normal tone BUE/BLE, good ROM, no bony  abnormality Psychiatric:  grossly normal mood and affect, speech fluent and appropriate, AOx3 Neurologic:  CN 2-12 grossly intact, moves all extremities in coordinated fashion, sensation intact  Labs on Admission: I have personally reviewed following labs and imaging studies  CBC:  Recent Labs Lab 08/16/16 1722  WBC 14.7*  HGB 13.9  HCT 43.4  MCV 87.1  PLT 99991111   Basic Metabolic Panel:  Recent Labs Lab 08/16/16 1722  NA 142  K 3.7  CL 105  CO2 28  GLUCOSE 90  BUN 13  CREATININE 0.87  CALCIUM 9.4   GFR: Estimated Creatinine Clearance: 56.4 mL/min (by C-G formula based on SCr of 0.87 mg/dL). Liver Function Tests:  Recent Labs Lab 08/16/16 1722  AST 14*  ALT 19  ALKPHOS 72  BILITOT 0.7  PROT 6.3*  ALBUMIN 3.6    Recent Labs Lab 08/16/16 1722  LIPASE 37   No results for input(s): AMMONIA in the last 168 hours. Coagulation Profile: No results for input(s): INR, PROTIME in the last 168 hours. Cardiac Enzymes: No results for input(s): CKTOTAL, CKMB, CKMBINDEX, TROPONINI in the last 168 hours. BNP (last 3 results) No results for input(s): PROBNP in the last 8760 hours. HbA1C: No results for input(s): HGBA1C in the last 72 hours. CBG: No results for input(s): GLUCAP in the last 168 hours. Lipid Profile: No results for input(s): CHOL, HDL, LDLCALC, TRIG, CHOLHDL, LDLDIRECT in the last 72 hours. Thyroid Function Tests: No results for input(s): TSH, T4TOTAL, FREET4, T3FREE, THYROIDAB in the last 72 hours. Anemia Panel: No results for input(s): VITAMINB12, FOLATE, FERRITIN, TIBC, IRON, RETICCTPCT in the last 72 hours. Urine analysis:    Component Value Date/Time   COLORURINE YELLOW 08/16/2016 1944   APPEARANCEUR CLEAR 08/16/2016 1944   LABSPEC 1.010 08/16/2016 1944   PHURINE 7.5 08/16/2016 1944   GLUCOSEU NEGATIVE 08/16/2016 1944   HGBUR NEGATIVE 08/16/2016 1944   BILIRUBINUR NEGATIVE 08/16/2016 1944   KETONESUR NEGATIVE 08/16/2016 1944   PROTEINUR  NEGATIVE 08/16/2016 1944   UROBILINOGEN 0.2 09/19/2015 0928   NITRITE NEGATIVE 08/16/2016 1944   LEUKOCYTESUR NEGATIVE 08/16/2016 1944    Creatinine Clearance: Estimated Creatinine Clearance: 56.4 mL/min (by C-G formula based on SCr of 0.87 mg/dL).  Sepsis Labs: @LABRCNTIP (procalcitonin:4,lacticidven:4) )No results found for this or any previous visit (from the past 240 hour(s)).   Radiological Exams on Admission: Ct Abdomen Pelvis W Contrast  Result Date: 08/16/2016 CLINICAL DATA:  Abdominal distension started today.  Severe pain. EXAM: CT ABDOMEN AND PELVIS WITH CONTRAST TECHNIQUE: Multidetector CT imaging of the abdomen and pelvis was performed using the standard protocol following bolus administration of intravenous contrast. CONTRAST:  161mL ISOVUE-300 IOPAMIDOL (ISOVUE-300) INJECTION 61% COMPARISON:  11/18/2015 FINDINGS: Lower chest:  No acute findings. Hepatobiliary: No masses or other significant abnormality. Pancreas: No mass, inflammatory changes, or other significant abnormality. Spleen: Within normal limits in size and appearance. Adrenals/Urinary Tract: No masses identified. No evidence of hydronephrosis. Stomach/Bowel: Small bowel dilatation measuring up to 3.4 cm in the mid abdomen with a a relative transition point in the anterior mid abdomen. No pneumatosis, pneumoperitoneum or portal venous gas. No abdominal or pelvic free fluid. Vascular/Lymphatic: No pathologically enlarged lymph nodes. No evidence of abdominal aortic aneurysm. Reproductive: Prior hysterectomy.  No adnexal mass. Other: None. Musculoskeletal:  No suspicious bone lesions identified. IMPRESSION: 1. Small bowel dilatation measuring up to 3.4 cm in the mid abdomen most concerning for small bowel obstruction with a relative transition point in the anterior mid abdomen. Electronically Signed   By: Kathreen Devoid   On: 08/16/2016 19:12    EKG: Independently reviewed.  NSR with rate 64; borderline ST elevation in inferior  leads which is generally unchanged from prior EKG (12/15)  Assessment/Plan Principal Problem:   SBO (small bowel obstruction) ([facility]) Active Problems:   Tobacco abuse   Hypertension   Hyperlipidemia   SBO -Patient with prior h/o 2 abdominal surgeries presenting with acute onset of abdominal pain with n/v, abdominal distention, and CT findings c/w SBO -Will place in observation status on Med Surg -Gen Surg consulted by ER and will see in AM; currently no indication for surgical intervention -NPO for bowel rest -NG tube in place -IVF hydration -Pain control with morphine -Already with some flatus and some bowel sounds so hopefully will have quick recovery  HTN -Continue metoprolol -Follow BP  HLD -Continue Lipitor  Tobacco dependence -Encourage cessation.  This was discussed with the patient and should be reviewed on an ongoing basis.   -Patch declined by patient. -Currently does not express interest in quitting.   DVT prophylaxis: Lovenox  Code Status:  Full - confirmed with patient/family Family Communication: Daughter present throughout evaluation   Disposition Plan:  Home once clinically improved Consults called: General Surgery (by ER)  Admission status: Observation to Med Surg    Karmen Bongo MD [facility]  If 7PM-7AM, please contact night-coverage [email or web address] Password [email or web address]  08/16/2016, 8:50 PM

## 2016-08-16 NOTE — ED Notes (Signed)
Patient aware that urine specimen is needed. Patient is asking for pain medication.  Will check with her nurse.

## 2016-08-17 DIAGNOSIS — Z8249 Family history of ischemic heart disease and other diseases of the circulatory system: Secondary | ICD-10-CM | POA: Diagnosis not present

## 2016-08-17 DIAGNOSIS — R1084 Generalized abdominal pain: Secondary | ICD-10-CM | POA: Diagnosis not present

## 2016-08-17 DIAGNOSIS — K5669 Other intestinal obstruction: Secondary | ICD-10-CM | POA: Diagnosis not present

## 2016-08-17 DIAGNOSIS — M549 Dorsalgia, unspecified: Secondary | ICD-10-CM | POA: Diagnosis present

## 2016-08-17 DIAGNOSIS — I10 Essential (primary) hypertension: Secondary | ICD-10-CM | POA: Diagnosis not present

## 2016-08-17 DIAGNOSIS — F17210 Nicotine dependence, cigarettes, uncomplicated: Secondary | ICD-10-CM | POA: Diagnosis present

## 2016-08-17 DIAGNOSIS — J449 Chronic obstructive pulmonary disease, unspecified: Secondary | ICD-10-CM | POA: Diagnosis present

## 2016-08-17 DIAGNOSIS — D72829 Elevated white blood cell count, unspecified: Secondary | ICD-10-CM | POA: Diagnosis present

## 2016-08-17 DIAGNOSIS — E785 Hyperlipidemia, unspecified: Secondary | ICD-10-CM | POA: Diagnosis present

## 2016-08-17 DIAGNOSIS — G8929 Other chronic pain: Secondary | ICD-10-CM | POA: Diagnosis present

## 2016-08-17 DIAGNOSIS — K5660 Unspecified intestinal obstruction: Secondary | ICD-10-CM | POA: Diagnosis not present

## 2016-08-17 LAB — BASIC METABOLIC PANEL
Anion gap: 6 (ref 5–15)
BUN: 11 mg/dL (ref 6–20)
CALCIUM: 8.4 mg/dL — AB (ref 8.9–10.3)
CO2: 27 mmol/L (ref 22–32)
CREATININE: 0.82 mg/dL (ref 0.44–1.00)
Chloride: 106 mmol/L (ref 101–111)
GFR calc non Af Amer: >60 mL/min (ref >60)
Glucose, Bld: 109 mg/dL — ABNORMAL HIGH (ref 65–99)
Potassium: 4 mmol/L (ref 3.5–5.1)
SODIUM: 139 mmol/L (ref 135–145)

## 2016-08-17 LAB — CBC
HCT: 42.4 % (ref 36.0–46.0)
Hemoglobin: 13.2 g/dL (ref 12.0–15.0)
MCH: 27.8 pg (ref 26.0–34.0)
MCHC: 31.1 g/dL (ref 30.0–36.0)
MCV: 89.3 fL (ref 78.0–100.0)
PLATELETS: 210 10*3/uL (ref 150–400)
RBC: 4.75 MIL/uL (ref 3.87–5.11)
RDW: 15.7 % — AB (ref 11.5–15.5)
WBC: 11.1 10*3/uL — AB (ref 4.0–10.5)

## 2016-08-17 NOTE — Progress Notes (Signed)
Subjective: Patient was admitted yesterday due to bowel obstruction. Patient has NG tube to suction and she is NPO. No fever or chills.  Objective: Vital signs in last 24 hours: Temp:  [97.6 F (36.4 C)-98.5 F (36.9 C)] 98.5 F (36.9 C) (08/31 0515) Pulse Rate:  [49-66] 66 (08/31 0515) Resp:  [16-20] 20 (08/31 0515) BP: (133-180)/(72-101) 133/72 (08/31 0515) SpO2:  [96 %-99 %] 99 % (08/31 0515) Weight:  [59 kg (130 lb)] 59 kg (130 lb) (08/30 1642) Weight change:  Last BM Date: 08/15/16  Intake/Output from previous day: 08/30 0701 - 08/31 0700 In: -  Out: 20 [Emesis/NG output:20]  PHYSICAL EXAM General appearance: alert and no distress Resp: diminished breath sounds bilaterally and rhonchi bilaterally Cardio: S1, S2 normal GI: soft and lax, slightly distended, bowel sound hypoactive Extremities: extremities normal, atraumatic, no cyanosis or edema  Lab Results:  Results for orders placed or performed during the hospital encounter of 08/16/16 (from the past 48 hour(s))  Lipase, blood     Status: None   Collection Time: 08/16/16  5:22 PM  Result Value Ref Range   Lipase 37 11 - 51 U/L  Comprehensive metabolic panel     Status: Abnormal   Collection Time: 08/16/16  5:22 PM  Result Value Ref Range   Sodium 142 135 - 145 mmol/L   Potassium 3.7 3.5 - 5.1 mmol/L   Chloride 105 101 - 111 mmol/L   CO2 28 22 - 32 mmol/L   Glucose, Bld 90 65 - 99 mg/dL   BUN 13 6 - 20 mg/dL   Creatinine, Ser 0.87 0.44 - 1.00 mg/dL   Calcium 9.4 8.9 - 10.3 mg/dL   Total Protein 6.3 (L) 6.5 - 8.1 g/dL   Albumin 3.6 3.5 - 5.0 g/dL   AST 14 (L) 15 - 41 U/L   ALT 19 14 - 54 U/L   Alkaline Phosphatase 72 38 - 126 U/L   Total Bilirubin 0.7 0.3 - 1.2 mg/dL   GFR calc non Af Amer >60 >60 mL/min   GFR calc Af Amer >60 >60 mL/min    Comment: (NOTE) The eGFR has been calculated using the CKD EPI equation. This calculation has not been validated in all clinical situations. eGFR's persistently <60  mL/min signify possible Chronic Kidney Disease.    Anion gap 9 5 - 15  CBC     Status: Abnormal   Collection Time: 08/16/16  5:22 PM  Result Value Ref Range   WBC 14.7 (H) 4.0 - 10.5 K/uL   RBC 4.98 3.87 - 5.11 MIL/uL   Hemoglobin 13.9 12.0 - 15.0 g/dL   HCT 43.4 36.0 - 46.0 %   MCV 87.1 78.0 - 100.0 fL   MCH 27.9 26.0 - 34.0 pg   MCHC 32.0 30.0 - 36.0 g/dL   RDW 15.2 11.5 - 15.5 %   Platelets 189 150 - 400 K/uL  Urinalysis, Routine w reflex microscopic     Status: None   Collection Time: 08/16/16  7:44 PM  Result Value Ref Range   Color, Urine YELLOW YELLOW   APPearance CLEAR CLEAR   Specific Gravity, Urine 1.010 1.005 - 1.030   pH 7.5 5.0 - 8.0   Glucose, UA NEGATIVE NEGATIVE mg/dL   Hgb urine dipstick NEGATIVE NEGATIVE   Bilirubin Urine NEGATIVE NEGATIVE   Ketones, ur NEGATIVE NEGATIVE mg/dL   Protein, ur NEGATIVE NEGATIVE mg/dL   Nitrite NEGATIVE NEGATIVE   Leukocytes, UA NEGATIVE NEGATIVE    Comment: MICROSCOPIC NOT  DONE ON URINES WITH NEGATIVE PROTEIN, BLOOD, LEUKOCYTES, NITRITE, OR GLUCOSE <1000 mg/dL.  Basic metabolic panel     Status: Abnormal   Collection Time: 08/17/16  5:42 AM  Result Value Ref Range   Sodium 139 135 - 145 mmol/L   Potassium 4.0 3.5 - 5.1 mmol/L   Chloride 106 101 - 111 mmol/L   CO2 27 22 - 32 mmol/L   Glucose, Bld 109 (H) 65 - 99 mg/dL   BUN 11 6 - 20 mg/dL   Creatinine, Ser 0.82 0.44 - 1.00 mg/dL   Calcium 8.4 (L) 8.9 - 10.3 mg/dL   GFR calc non Af Amer >60 >60 mL/min   GFR calc Af Amer >60 >60 mL/min    Comment: (NOTE) The eGFR has been calculated using the CKD EPI equation. This calculation has not been validated in all clinical situations. eGFR's persistently <60 mL/min signify possible Chronic Kidney Disease.    Anion gap 6 5 - 15  CBC     Status: Abnormal   Collection Time: 08/17/16  5:42 AM  Result Value Ref Range   WBC 11.1 (H) 4.0 - 10.5 K/uL   RBC 4.75 3.87 - 5.11 MIL/uL   Hemoglobin 13.2 12.0 - 15.0 g/dL   HCT 42.4 36.0  - 46.0 %   MCV 89.3 78.0 - 100.0 fL   MCH 27.8 26.0 - 34.0 pg   MCHC 31.1 30.0 - 36.0 g/dL   RDW 15.7 (H) 11.5 - 15.5 %   Platelets 210 150 - 400 K/uL    ABGS No results for input(s): PHART, PO2ART, TCO2, HCO3 in the last 72 hours.  Invalid input(s): PCO2 CULTURES No results found for this or any previous visit (from the past 240 hour(s)). Studies/Results: Ct Abdomen Pelvis W Contrast  Result Date: 08/16/2016 CLINICAL DATA:  Abdominal distension started today.  Severe pain. EXAM: CT ABDOMEN AND PELVIS WITH CONTRAST TECHNIQUE: Multidetector CT imaging of the abdomen and pelvis was performed using the standard protocol following bolus administration of intravenous contrast. CONTRAST:  158m ISOVUE-300 IOPAMIDOL (ISOVUE-300) INJECTION 61% COMPARISON:  11/18/2015 FINDINGS: Lower chest:  No acute findings. Hepatobiliary: No masses or other significant abnormality. Pancreas: No mass, inflammatory changes, or other significant abnormality. Spleen: Within normal limits in size and appearance. Adrenals/Urinary Tract: No masses identified. No evidence of hydronephrosis. Stomach/Bowel: Small bowel dilatation measuring up to 3.4 cm in the mid abdomen with a a relative transition point in the anterior mid abdomen. No pneumatosis, pneumoperitoneum or portal venous gas. No abdominal or pelvic free fluid. Vascular/Lymphatic: No pathologically enlarged lymph nodes. No evidence of abdominal aortic aneurysm. Reproductive: Prior hysterectomy.  No adnexal mass. Other: None. Musculoskeletal:  No suspicious bone lesions identified. IMPRESSION: 1. Small bowel dilatation measuring up to 3.4 cm in the mid abdomen most concerning for small bowel obstruction with a relative transition point in the anterior mid abdomen. Electronically Signed   By: HKathreen Devoid  On: 08/16/2016 19:12    Medications: I have reviewed the patient's current medications.  Assesment:  Principal Problem:   SBO (small bowel obstruction)  (HCC) Active Problems:   Tobacco abuse   Hypertension   Hyperlipidemia    Plan:  Medications reviewed Will continue IV fluid Keep NPO Continue NG tube suctioning Surgical consult pending    LOS: 0 days   Buzz Axel 08/17/2016, 8:06 AM

## 2016-08-17 NOTE — Care Management Obs Status (Signed)
[facility] NOTIFICATION   Patient Details  Name: Virginia Washington MRN: [id number] Date of Birth: [birth year]   Medicare Observation Status Notification Given:  Yes    Sherald Barge, RN 08/17/2016, 2:14 PM

## 2016-08-17 NOTE — Progress Notes (Signed)
Patient passing gas multiple times.  Patient states that she, "feels much better.  I am hungry."

## 2016-08-17 NOTE — Care Management Note (Signed)
Case Management Note  Patient Details  Name: Virginia Washington MRN: [id number] Date of Birth: [birth year]  Subjective/Objective:                  Pt admitted with SBO. She is from home, lives with her grandson and is ind with ADL's. She has PCP, transportation and no difficulty affording medications. She plans to return home with self care at DC.   Action/Plan: No CM needs anticipated.   Expected Discharge Date:   92/2017/               Expected Discharge Plan:  Home/Self Care  In-House Referral:  NA  Discharge planning Services  CM Consult  Post Acute Care Choice:  NA Choice offered to:  NA  DME Arranged:    DME Agency:     HH Arranged:    HH Agency:     Status of Service:  Completed, signed off  If discussed at [employer] of Stay Meetings, dates discussed:    Additional Comments:  Sherald Barge, RN 08/17/2016, 2:14 PM

## 2016-08-17 NOTE — Progress Notes (Signed)
Patient's NG tube removed per order.  Patient passing gas, abdomen soft, denies pain.  Patient to have clear liquid diet per order.

## 2016-08-17 NOTE — Consult Note (Signed)
SURGICAL CONSULTATION NOTE (initial) - cpt: [phone]  HISTORY OF PRESENT ILLNESS (HPI):  59 y.o. female presented with < 24 hours of crampy diffuse abdominal pain that she reports began ~1 pm yesterday afternoon when she awoke at home from a nap after cleaning somebody's house + N/V x2. Prior to that, patient reports she was feeling well, passing flatus, and had a normal BM yesterday morning. At time of presentation, she was still not passing any flatus. An NG tube was placed for GI decompression in the ED, and patient describes she shortly thereafter felt relief from her presenting abdominal pain and nausea, and this morning she began passing flatus with improving appetite. Patient denies fever/chills, CP, SOB, or any prior similar experiences since her abdominal hysterectomy and open cholecystectomy (1980's).  Surgery is consulted by ED physician Dr. Ellender Hose and medical physician Dr. Lorin Mercy in this context for evaluation and management of patient's small bowel obstruction.  PAST MEDICAL HISTORY (PMH):  Past Medical History:  Diagnosis Date  . Chronic back pain   . COPD (chronic obstructive pulmonary disease) ([place])   . Headache   . Hyperlipidemia   . Hypertension   . Normal cardiac stress test 12/2014     PAST SURGICAL HISTORY [facility]):  Past Surgical History:  Procedure Laterality Date  . ABDOMINAL HYSTERECTOMY    . CHOLECYSTECTOMY       MEDICATIONS:  Prior to Admission medications   Medication Sig Start Date End Date Taking? Authorizing Provider  atorvastatin (LIPITOR) 20 MG tablet Take 1 tablet (20 mg total) by mouth daily. 12/23/14  Yes Fay Records, MD  hydrOXYzine (VISTARIL) 25 MG capsule Take 1 capsule (25 mg total) by mouth every 6 (six) hours as needed. 07/30/16  Yes Lily Kocher, PA-C  metoprolol tartrate (LOPRESSOR) 12.5 mg TABS tablet Take 0.5 tablets (12.5 mg total) by mouth 2 (two) times daily. Patient taking differently: Take 25 mg by mouth 2 (two) times daily.  12/13/14  Yes  Nita Sells, MD     ALLERGIES:  No Known Allergies   SOCIAL HISTORY:  Social History   Social History  . Marital status: Widowed    Spouse name: N/A  . Number of children: N/A  . Years of education: N/A   Occupational History  . unemployed    Social History Main Topics  . Smoking status: Current Every Day Smoker    Packs/day: 0.50    Years: 40.00    Types: Cigarettes  . Smokeless tobacco: Never Used  . Alcohol use No  . Drug use: No  . Sexual activity: No   Other Topics Concern  . Not on file   Social History Narrative  . No narrative on file    The patient currently resides (home / rehab facility / nursing home): Home  The patient normally is (ambulatory / bedbound): Ambulatory  FAMILY HISTORY:  Family History  Problem Relation Age of Onset  . Hypertension Mother      REVIEW OF SYSTEMS:  Constitutional: denies weight loss, fever, chills, or sweats  Eyes: denies any other vision changes, history of eye injury  ENT: denies sore throat, hearing problems  Respiratory: denies shortness of breath, wheezing  Cardiovascular: denies chest pain, palpitations  Gastrointestinal: abdominal pain, N/V, and bowel function as per HPI  Musculoskeletal: denies any other joint pains or cramps  Skin: denies any other rashes or skin discolorations  Neurological: denies any other headache, dizziness, weakness  Psychiatric: denies any other depression, anxiety   All other review  of systems were negative   VITAL SIGNS:  Temp:  [97.6 F (36.4 C)-98.5 F (36.9 C)] 98.5 F (36.9 C) (08/31 0515) Pulse Rate:  [49-66] 66 (08/31 0515) Resp:  [16-20] 20 (08/31 0515) BP: (133-180)/(72-101) 133/72 (08/31 0515) SpO2:  [96 %-99 %] 99 % (08/31 0515) Weight:  [59 kg (130 lb)] 59 kg (130 lb) (08/30 1642)     Height: 5\' 2"  (157.5 cm) Weight: 59 kg (130 lb) BMI (Calculated): 23.8   INTAKE/OUTPUT:  This shift: No intake/output data recorded.  Last 2 shifts: @IOLAST2SHIFTS @    PHYSICAL EXAM:  Constitutional:  -- Normal body habitus  -- Awake, alert, and oriented x3  Eyes:  -- Pupils equally round and reactive to light  -- No scleral icterus  Ear, nose, and throat:  -- No jugular venous distension -- NT tube in place, secured, and functioning Pulmonary:  -- No crackles  -- Equal breath sounds bilaterally -- Breathing non-labored at rest Cardiovascular:  -- S1, S2 present  -- No pericardial rubs Gastrointestinal:  -- Abdomen soft, nontender, nondistended, no guarding/rebound  -- No abdominal masses appreciated, pulsatile or otherwise  Musculoskeletal / Integumentary:  -- Wounds or skin discoloration: None appreciated -- Extremities: B/L UE and LE FROM, hands and feet warm, no edema  Neurologic:  -- Motor function: intact and symmetric -- Sensation: intact and symmetric  Labs:  CBC:  Lab Results  Component Value Date   WBC 11.1 (H) 08/17/2016   RBC 4.75 08/17/2016   BMP:  Lab Results  Component Value Date   GLUCOSE 109 (H) 08/17/2016   CO2 27 08/17/2016   BUN 11 08/17/2016   CREATININE 0.82 08/17/2016   CALCIUM 8.4 (L) 08/17/2016     Imaging studies:  CT Abdomen and Pelvis with Contrast (08/16/2016) Small bowel dilatation measuring up to 3.4 cm in the mid abdomen with a a relative transition point in the anterior mid abdomen. No pneumatosis, pneumoperitoneum or portal venous  gas. No abdominal or pelvic free fluid.  Assessment/Plan: (ICD-10's: K79.69) 60 y.o. 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   - NPO, IVF   - nasogastric decompression for now  - will reassess patient symptoms, flatus, and volume NGT drainage ~5 pm  - if no recurrence of abdominal pain and nausea with +flatus and <200 mL drainage, may remove NGT and start clear liquids diet  - ambulation and smoking  cessation encouraged and discussed  - medical management of comorbidities per medicine  - DVT prophylaxis  All of the above findings and recommendations were discussed with the patient, and all of her questions were answered to her expressed satisfaction.  Thank you for the opportunity to participate in this patient's care.   -- Marilynne Drivers Rosana Hoes, MD, [place]: [place] and V[facility] Surgery Office: [phone]

## 2016-08-18 NOTE — Progress Notes (Signed)
Subjective: Patient feels better. No nausea, vomiting or abdominal pain. She is tolerating clear liquid diet. She has not moved her bowel yet.  Objective: Vital signs in last 24 hours: Temp:  [98.5 F (36.9 C)-98.6 F (37 C)] 98.5 F (36.9 C) (09/01 0652) Pulse Rate:  [62-65] 62 (09/01 0652) Resp:  [20] 20 (09/01 0652) BP: (137-155)/(64-76) 155/76 (09/01 0652) SpO2:  [92 %-96 %] 92 % (09/01 0652) Weight change:  Last BM Date: 08/15/16  Intake/Output from previous day: 08/31 0701 - 09/01 0700 In: 890 [P.O.:240; I.V.:650] Out: 500 [Urine:300; Emesis/NG output:200]  PHYSICAL EXAM General appearance: alert and no distress Resp: diminished breath sounds bilaterally and rhonchi bilaterally Cardio: S1, S2 normal GI: soft, non-tender; bowel sounds normal; no masses,  no organomegaly and a Extremities: extremities normal, atraumatic, no cyanosis or edema  Lab Results:  Results for orders placed or performed during the hospital encounter of 08/16/16 (from the past 48 hour(s))  Lipase, blood     Status: None   Collection Time: 08/16/16  5:22 PM  Result Value Ref Range   Lipase 37 11 - 51 U/L  Comprehensive metabolic panel     Status: Abnormal   Collection Time: 08/16/16  5:22 PM  Result Value Ref Range   Sodium 142 135 - 145 mmol/L   Potassium 3.7 3.5 - 5.1 mmol/L   Chloride 105 101 - 111 mmol/L   CO2 28 22 - 32 mmol/L   Glucose, Bld 90 65 - 99 mg/dL   BUN 13 6 - 20 mg/dL   Creatinine, Ser 0.87 0.44 - 1.00 mg/dL   Calcium 9.4 8.9 - 10.3 mg/dL   Total Protein 6.3 (L) 6.5 - 8.1 g/dL   Albumin 3.6 3.5 - 5.0 g/dL   AST 14 (L) 15 - 41 U/L   ALT 19 14 - 54 U/L   Alkaline Phosphatase 72 38 - 126 U/L   Total Bilirubin 0.7 0.3 - 1.2 mg/dL   GFR calc non Af Amer >60 >60 mL/min   GFR calc Af Amer >60 >60 mL/min    Comment: (NOTE) The eGFR has been calculated using the CKD EPI equation. This calculation has not been validated in all clinical situations. eGFR's persistently <60  mL/min signify possible Chronic Kidney Disease.    Anion gap 9 5 - 15  CBC     Status: Abnormal   Collection Time: 08/16/16  5:22 PM  Result Value Ref Range   WBC 14.7 (H) 4.0 - 10.5 K/uL   RBC 4.98 3.87 - 5.11 MIL/uL   Hemoglobin 13.9 12.0 - 15.0 g/dL   HCT 43.4 36.0 - 46.0 %   MCV 87.1 78.0 - 100.0 fL   MCH 27.9 26.0 - 34.0 pg   MCHC 32.0 30.0 - 36.0 g/dL   RDW 15.2 11.5 - 15.5 %   Platelets 189 150 - 400 K/uL  Urinalysis, Routine w reflex microscopic     Status: None   Collection Time: 08/16/16  7:44 PM  Result Value Ref Range   Color, Urine YELLOW YELLOW   APPearance CLEAR CLEAR   Specific Gravity, Urine 1.010 1.005 - 1.030   pH 7.5 5.0 - 8.0   Glucose, UA NEGATIVE NEGATIVE mg/dL   Hgb urine dipstick NEGATIVE NEGATIVE   Bilirubin Urine NEGATIVE NEGATIVE   Ketones, ur NEGATIVE NEGATIVE mg/dL   Protein, ur NEGATIVE NEGATIVE mg/dL   Nitrite NEGATIVE NEGATIVE   Leukocytes, UA NEGATIVE NEGATIVE    Comment: MICROSCOPIC NOT DONE ON URINES WITH NEGATIVE PROTEIN,  BLOOD, LEUKOCYTES, NITRITE, OR GLUCOSE <1000 mg/dL.  Basic metabolic panel     Status: Abnormal   Collection Time: 08/17/16  5:42 AM  Result Value Ref Range   Sodium 139 135 - 145 mmol/L   Potassium 4.0 3.5 - 5.1 mmol/L   Chloride 106 101 - 111 mmol/L   CO2 27 22 - 32 mmol/L   Glucose, Bld 109 (H) 65 - 99 mg/dL   BUN 11 6 - 20 mg/dL   Creatinine, Ser 0.82 0.44 - 1.00 mg/dL   Calcium 8.4 (L) 8.9 - 10.3 mg/dL   GFR calc non Af Amer >60 >60 mL/min   GFR calc Af Amer >60 >60 mL/min    Comment: (NOTE) The eGFR has been calculated using the CKD EPI equation. This calculation has not been validated in all clinical situations. eGFR's persistently <60 mL/min signify possible Chronic Kidney Disease.    Anion gap 6 5 - 15  CBC     Status: Abnormal   Collection Time: 08/17/16  5:42 AM  Result Value Ref Range   WBC 11.1 (H) 4.0 - 10.5 K/uL   RBC 4.75 3.87 - 5.11 MIL/uL   Hemoglobin 13.2 12.0 - 15.0 g/dL   HCT 42.4 36.0  - 46.0 %   MCV 89.3 78.0 - 100.0 fL   MCH 27.8 26.0 - 34.0 pg   MCHC 31.1 30.0 - 36.0 g/dL   RDW 15.7 (H) 11.5 - 15.5 %   Platelets 210 150 - 400 K/uL    ABGS No results for input(s): PHART, PO2ART, TCO2, HCO3 in the last 72 hours.  Invalid input(s): PCO2 CULTURES No results found for this or any previous visit (from the past 240 hour(s)). Studies/Results: Ct Abdomen Pelvis W Contrast  Result Date: 08/16/2016 CLINICAL DATA:  Abdominal distension started today.  Severe pain. EXAM: CT ABDOMEN AND PELVIS WITH CONTRAST TECHNIQUE: Multidetector CT imaging of the abdomen and pelvis was performed using the standard protocol following bolus administration of intravenous contrast. CONTRAST:  134m ISOVUE-300 IOPAMIDOL (ISOVUE-300) INJECTION 61% COMPARISON:  11/18/2015 FINDINGS: Lower chest:  No acute findings. Hepatobiliary: No masses or other significant abnormality. Pancreas: No mass, inflammatory changes, or other significant abnormality. Spleen: Within normal limits in size and appearance. Adrenals/Urinary Tract: No masses identified. No evidence of hydronephrosis. Stomach/Bowel: Small bowel dilatation measuring up to 3.4 cm in the mid abdomen with a a relative transition point in the anterior mid abdomen. No pneumatosis, pneumoperitoneum or portal venous gas. No abdominal or pelvic free fluid. Vascular/Lymphatic: No pathologically enlarged lymph nodes. No evidence of abdominal aortic aneurysm. Reproductive: Prior hysterectomy.  No adnexal mass. Other: None. Musculoskeletal:  No suspicious bone lesions identified. IMPRESSION: 1. Small bowel dilatation measuring up to 3.4 cm in the mid abdomen most concerning for small bowel obstruction with a relative transition point in the anterior mid abdomen. Electronically Signed   By: HKathreen Devoid  On: 08/16/2016 19:12    Medications: I have reviewed the patient's current medications.  Assesment:  Principal Problem:   SBO (small bowel obstruction)  (HCC) Active Problems:   Tobacco abuse   Hypertension   Hyperlipidemia    Plan:  Medications reviewed  continue IV fluid advance diet as tolerated CBC/BMP in am Surgical consult appreciated    LOS: 1 day   Kinda Pottle 08/18/2016, 8:08 AM

## 2016-08-18 NOTE — Care Management Important Message (Signed)
Important Message  Patient Details  Name: GRAYCELYN KINZLER MRN: [id number] Date of Birth: 1956-01-17   Medicare Important Message Given:  Yes    Sherald Barge, RN 08/18/2016, 9:31 AM

## 2016-08-18 NOTE — Progress Notes (Signed)
SURGICAL PROGRESS NOTE (cpt [phone])  Hospital Day(s): 1.   Post op day(s):  Marland Kitchen   Interval History: Patient seen and examined, no acute events or new complaints overnight. Patient reports she continues to pass flatus, ambulate, and has been tolerating clear liquids with increased appetite, requests regular food and denies any N/V, bloating, fever/chills, CP or SOB.  Review of Systems:  Constitutional: denies fever, chills  HEENT: denies cough or congestion  Respiratory: denies any shortness of breath  Cardiovascular: denies chest pain or palpitations  Gastrointestinal: denies N/V, diarrhea or constipation  Musculoskeletal: denies pain, decreased motor or sensation  Neurological: denies HA or vision/hearing changes   Vital signs in last 24 hours: [min-max] current  Temp:  [98.5 F (36.9 C)-98.6 F (37 C)] 98.5 F (36.9 C) (09/01 0652) Pulse Rate:  [62-65] 62 (09/01 0652) Resp:  [20] 20 (09/01 0652) BP: (137-155)/(64-76) 155/76 (09/01 0652) SpO2:  [92 %-96 %] 92 % (09/01 0652)     Height: 5\' 2"  (157.5 cm) Weight: 59 kg (130 lb) BMI (Calculated): 23.8   Intake/Output this shift:  No intake/output data recorded.   Intake/Output last 2 shifts:  @IOLAST2SHIFTS @   Physical Exam:  Constitutional: alert, cooperative and no distress  HENT: normocephalic without obvious abnormality  Eyes: PERRL, EOM's grossly intact and symmetric  Neuro: CN II - XII grossly intact and symmetric without deficit  Respiratory: breathing non-labored at rest  Cardiovascular: regular rate and sinus rhythm  Gastrointestinal: soft, non-tender, and non-distended  Musculoskeletal: UE and LE FROM, no edema or wounds, motor and sensation grossly intact, NT    Labs:  CBC:  Lab Results  Component Value Date   WBC 11.1 (H) 08/17/2016   RBC 4.75 08/17/2016   BMP:  Lab Results  Component Value Date   GLUCOSE 109 (H) 08/17/2016   CO2 27 08/17/2016   BUN 11 08/17/2016   CREATININE 0.82 08/17/2016   CALCIUM 8.4 (L) 08/17/2016     Imaging studies: No new pertinent imaging studies    Assessment/Plan: (ICD-10's: K56.69) 60 y.o. 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   - advance diet as tolerated, monitor bowel function  - ambulation and smoking cessation encouraged and discussed  - medical management of comorbidities per medicine  - DVT prophylaxis  All of the above findings and recommendations were discussed with the patient, and all of her questions were answered to her expressed satisfaction.  Thank you for the opportunity to participate in this patient's care.   -- Marilynne Drivers Rosana Hoes, MD, [place]: [place] and Vascular Surgery Office: [phone]

## 2016-08-19 LAB — BASIC METABOLIC PANEL
ANION GAP: 5 (ref 5–15)
BUN: 7 mg/dL (ref 6–20)
CALCIUM: 8.5 mg/dL — AB (ref 8.9–10.3)
CO2: 26 mmol/L (ref 22–32)
CREATININE: 0.81 mg/dL (ref 0.44–1.00)
Chloride: 108 mmol/L (ref 101–111)
GFR calc Af Amer: >60 mL/min (ref >60)
GFR calc non Af Amer: >60 mL/min (ref >60)
Glucose, Bld: 98 mg/dL (ref 65–99)
POTASSIUM: 3.9 mmol/L (ref 3.5–5.1)
Sodium: 139 mmol/L (ref 135–145)

## 2016-08-19 LAB — CBC
HCT: 41.5 % (ref 36.0–46.0)
Hemoglobin: 13.1 g/dL (ref 12.0–15.0)
MCH: 27.9 pg (ref 26.0–34.0)
MCHC: 31.6 g/dL (ref 30.0–36.0)
MCV: 88.3 fL (ref 78.0–100.0)
PLATELETS: 196 10*3/uL (ref 150–400)
RBC: 4.7 MIL/uL (ref 3.87–5.11)
RDW: 15.1 % (ref 11.5–15.5)
WBC: 6.6 10*3/uL (ref 4.0–10.5)

## 2016-08-19 MED ORDER — GUAIFENESIN-DM 100-10 MG/5ML PO SYRP
5.0000 mL | ORAL_SOLUTION | ORAL | Status: DC | PRN
  Administered 2016-08-19 – 2016-08-20 (×4): 5 mL via ORAL
  Filled 2016-08-19 (×4): qty 5

## 2016-08-19 NOTE — Progress Notes (Signed)
Patient is tolerating her advance diet she's had several trips for a bowel movement last night no hematochezia noted no tenesmus CAPRIA CARTAYA [id number] DOB: 06-22-1956 DOA: 08/16/2016 PCP: [name], MD   Physical Exam: Blood pressure (!) 172/91, pulse 65, temperature 98.7 F (37.1 C), temperature source Oral, resp. rate 18, height 5' 2"  (1.575 m), weight 59 kg (130 lb), SpO2 91 %. Lungs prolonged expiratory phase scattered rhonchi no rales no wheezes audible heart regular rhythm no S3 or S4 no heaves thrills rubs abdomen soft bowel sounds normoactive no peristaltic rushes no guarding or rebound   Investigations:  No results found for this or any previous visit (from the past 240 hour(s)).   Basic Metabolic Panel:  Recent Labs  08/17/16 0542 08/19/16 0606  NA 139 139  K 4.0 3.9  CL 106 108  CO2 27 26  GLUCOSE 109* 98  BUN 11 7  CREATININE 0.82 0.81  CALCIUM 8.4* 8.5*   Liver Function Tests:  Recent Labs  08/16/16 1722  AST 14*  ALT 19  ALKPHOS 72  BILITOT 0.7  PROT 6.3*  ALBUMIN 3.6     CBC:  Recent Labs  08/17/16 0542 08/19/16 0606  WBC 11.1* 6.6  HGB 13.2 13.1  HCT 42.4 41.5  MCV 89.3 88.3  PLT 210 196    No results found.    Medications:   Impression:  Principal Problem:   SBO (small bowel obstruction) (HCC) Active Problems:   Tobacco abuse   Hypertension   Hyperlipidemia     Plan: Check be met in a.m. continue advancing diet  Consultants: Surgery   Procedures   Antibiotics:            Time spent: 30 minutes   LOS: 2 days   Calandria Mullings M   08/19/2016, 12:47 PM

## 2016-08-20 LAB — BASIC METABOLIC PANEL
ANION GAP: 6 (ref 5–15)
BUN: 14 mg/dL (ref 6–20)
CO2: 24 mmol/L (ref 22–32)
Calcium: 8.4 mg/dL — ABNORMAL LOW (ref 8.9–10.3)
Chloride: 108 mmol/L (ref 101–111)
Creatinine, Ser: 0.91 mg/dL (ref 0.44–1.00)
GFR calc Af Amer: >60 mL/min (ref >60)
Glucose, Bld: 104 mg/dL — ABNORMAL HIGH (ref 65–99)
POTASSIUM: 4.2 mmol/L (ref 3.5–5.1)
SODIUM: 138 mmol/L (ref 135–145)

## 2016-08-20 MED ORDER — METOPROLOL TARTRATE 25 MG PO TABS: 25.0000 mg | ORAL_TABLET | Freq: Two times a day (BID) | ORAL | 12 refills | Status: DC

## 2016-08-20 MED ORDER — GUAIFENESIN-DM 100-10 MG/5ML PO SYRP: 5.0000 mL | ORAL_SOLUTION | ORAL | 0 refills | Status: DC | PRN

## 2016-08-20 NOTE — Discharge Summary (Signed)
Physician Discharge Summary  Patient ID: Virginia Washington MRN: [id number] DOB/AGE: 1956/12/13 60 y.o. Primary Care Physician:FANTA,TESFAYE, MD Admit date: 08/16/2016 Discharge date: 08/20/2016    Discharge Diagnoses:   Principal Problem:   SBO (small bowel obstruction) (HCC) Active Problems:   Tobacco abuse   Hypertension   Hyperlipidemia COPD    Medication List    TAKE these medications   atorvastatin 20 MG tablet Commonly known as:  LIPITOR Take 1 tablet (20 mg total) by mouth daily.   guaiFENesin-dextromethorphan 100-10 MG/5ML syrup Commonly known as:  ROBITUSSIN DM Take 5 mLs by mouth every 4 (four) hours as needed for cough.   hydrOXYzine 25 MG capsule Commonly known as:  VISTARIL Take 1 capsule (25 mg total) by mouth every 6 (six) hours as needed.   metoprolol tartrate 25 MG tablet Commonly known as:  LOPRESSOR Take 1 tablet (25 mg total) by mouth 2 (two) times daily. What changed:  medication strength  how much to take       Discharged Condition:Improved    Consults: Surgery/Dr. Rosana Hoes  Significant Diagnostic Studies: Ct Abdomen Pelvis W Contrast  Result Date: 08/16/2016 CLINICAL DATA:  Abdominal distension started today.  Severe pain. EXAM: CT ABDOMEN AND PELVIS WITH CONTRAST TECHNIQUE: Multidetector CT imaging of the abdomen and pelvis was performed using the standard protocol following bolus administration of intravenous contrast. CONTRAST:  178mL ISOVUE-300 IOPAMIDOL (ISOVUE-300) INJECTION 61% COMPARISON:  11/18/2015 FINDINGS: Lower chest:  No acute findings. Hepatobiliary: No masses or other significant abnormality. Pancreas: No mass, inflammatory changes, or other significant abnormality. Spleen: Within normal limits in size and appearance. Adrenals/Urinary Tract: No masses identified. No evidence of hydronephrosis. Stomach/Bowel: Small bowel dilatation measuring up to 3.4 cm in the mid abdomen with a a relative transition point in the anterior mid  abdomen. No pneumatosis, pneumoperitoneum or portal venous gas. No abdominal or pelvic free fluid. Vascular/Lymphatic: No pathologically enlarged lymph nodes. No evidence of abdominal aortic aneurysm. Reproductive: Prior hysterectomy.  No adnexal mass. Other: None. Musculoskeletal:  No suspicious bone lesions identified. IMPRESSION: 1. Small bowel dilatation measuring up to 3.4 cm in the mid abdomen most concerning for small bowel obstruction with a relative transition point in the anterior mid abdomen. Electronically Signed   By: Kathreen Devoid   On: 08/16/2016 19:12    Lab Results: Basic Metabolic Panel:  Recent Labs  08/19/16 0606 08/20/16 0525  NA 139 138  K 3.9 4.2  CL 108 108  CO2 26 24  GLUCOSE 98 104*  BUN 7 14  CREATININE 0.81 0.91  CALCIUM 8.5* 8.4*   Liver Function Tests: No results for input(s): AST, ALT, ALKPHOS, BILITOT, PROT, ALBUMIN in the last 72 hours.   CBC:  Recent Labs  08/19/16 0606  WBC 6.6  HGB 13.1  HCT 41.5  MCV 88.3  PLT 196    No results found for this or any previous visit (from the past 240 hour(s)).   Hospital Course: This is a 60 year old came to the hospital because of abdominal discomfort and nausea and vomiting. She was found to have small bowel obstruction. Surgery consultation was obtained and it was felt that she did not require surgery now. She improved over the next several days was able to eat and keep food down her nausea and vomiting stopped she had bowel movements and felt much better.  Discharge Exam: Blood pressure (!) 145/75, pulse 66, temperature 98 F (36.7 C), temperature source Oral, resp. rate 20, height 5\' 2"  (1.575 m), weight  59 kg (130 lb), SpO2 98 %. She is awake and alert. Her abdomen is soft. Her chest is clear. She is coughing  Disposition: Home      Signed: Heidi Maclin L   08/20/2016, 10:23 AM

## 2016-08-20 NOTE — Progress Notes (Signed)
Subjective: She says she feels well. She has no complaints. She wants to go home. Her bowels have moved  Objective: Vital signs in last 24 hours: Temp:  [98 F (36.7 C)-98.4 F (36.9 C)] 98 F (36.7 C) (09/03 0433) Pulse Rate:  [60-78] 66 (09/03 0433) Resp:  [18-20] 20 (09/03 0433) BP: (145-158)/(71-83) 145/75 (09/03 0433) SpO2:  [97 %-99 %] 98 % (09/03 0433) Weight change:  Last BM Date: 08/19/16  Intake/Output from previous day: 09/02 0701 - 09/03 0700 In: 3210 [P.O.:960; I.V.:2250] Out: 2000 [Urine:2000]  PHYSICAL EXAM General appearance: alert, cooperative and no distress Resp: clear to auscultation bilaterally Cardio: regular rate and rhythm, S1, S2 normal, no murmur, click, rub or gallop GI: soft, non-tender; bowel sounds normal; no masses,  no organomegaly Extremities: extremities normal, atraumatic, no cyanosis or edema  Lab Results:  Results for orders placed or performed during the hospital encounter of 08/16/16 (from the past 48 hour(s))  Basic metabolic panel     Status: Abnormal   Collection Time: 08/19/16  6:06 AM  Result Value Ref Range   Sodium 139 135 - 145 mmol/L   Potassium 3.9 3.5 - 5.1 mmol/L   Chloride 108 101 - 111 mmol/L   CO2 26 22 - 32 mmol/L   Glucose, Bld 98 65 - 99 mg/dL   BUN 7 6 - 20 mg/dL   Creatinine, Ser 0.81 0.44 - 1.00 mg/dL   Calcium 8.5 (L) 8.9 - 10.3 mg/dL   GFR calc non Af Amer >60 >60 mL/min   GFR calc Af Amer >60 >60 mL/min    Comment: (NOTE) The eGFR has been calculated using the CKD EPI equation. This calculation has not been validated in all clinical situations. eGFR's persistently <60 mL/min signify possible Chronic Kidney Disease.    Anion gap 5 5 - 15  CBC     Status: None   Collection Time: 08/19/16  6:06 AM  Result Value Ref Range   WBC 6.6 4.0 - 10.5 K/uL   RBC 4.70 3.87 - 5.11 MIL/uL   Hemoglobin 13.1 12.0 - 15.0 g/dL   HCT 41.5 36.0 - 46.0 %   MCV 88.3 78.0 - 100.0 fL   MCH 27.9 26.0 - 34.0 pg   MCHC 31.6  30.0 - 36.0 g/dL   RDW 15.1 11.5 - 15.5 %   Platelets 196 150 - 400 K/uL  Basic metabolic panel     Status: Abnormal   Collection Time: 08/20/16  5:25 AM  Result Value Ref Range   Sodium 138 135 - 145 mmol/L   Potassium 4.2 3.5 - 5.1 mmol/L   Chloride 108 101 - 111 mmol/L   CO2 24 22 - 32 mmol/L   Glucose, Bld 104 (H) 65 - 99 mg/dL   BUN 14 6 - 20 mg/dL   Creatinine, Ser 0.91 0.44 - 1.00 mg/dL   Calcium 8.4 (L) 8.9 - 10.3 mg/dL   GFR calc non Af Amer >60 >60 mL/min   GFR calc Af Amer >60 >60 mL/min    Comment: (NOTE) The eGFR has been calculated using the CKD EPI equation. This calculation has not been validated in all clinical situations. eGFR's persistently <60 mL/min signify possible Chronic Kidney Disease.    Anion gap 6 5 - 15    ABGS No results for input(s): PHART, PO2ART, TCO2, HCO3 in the last 72 hours.  Invalid input(s): PCO2 CULTURES No results found for this or any previous visit (from the past 240 hour(s)). Studies/Results: No results  found.  Medications:  Prior to Admission:  Prescriptions Prior to Admission  Medication Sig Dispense Refill Last Dose  . atorvastatin (LIPITOR) 20 MG tablet Take 1 tablet (20 mg total) by mouth daily. 90 tablet 3 08/15/2016 at Unknown time  . hydrOXYzine (VISTARIL) 25 MG capsule Take 1 capsule (25 mg total) by mouth every 6 (six) hours as needed. 15 capsule 0 unknown  . metoprolol tartrate (LOPRESSOR) 12.5 mg TABS tablet Take 0.5 tablets (12.5 mg total) by mouth 2 (two) times daily. (Patient taking differently: Take 25 mg by mouth 2 (two) times daily. ) 60 tablet 0 08/15/2016 at Unknown time   Scheduled: . atorvastatin  20 mg Oral Daily  . enoxaparin (LOVENOX) injection  40 mg Subcutaneous Q24H  . metoprolol tartrate  25 mg Oral BID   Continuous: . lactated ringers 75 mL/hr at 08/19/16 1650   YIR:[id number] **OR** acetaminophen, guaiFENesin-dextromethorphan, morphine injection, ondansetron **OR** ondansetron (ZOFRAN)  IV  Assesment: She was admitted with small bowel obstruction and she is much improved. She is ready for discharge. Please see discharge summary for details Principal Problem:   SBO (small bowel obstruction) ([place]) Active Problems:   Tobacco abuse   Hypertension   Hyperlipidemia    Plan: Discharge home today    LOS: 3 days   Katelynne Revak L 08/20/2016, 10:12 AM

## 2016-08-20 NOTE — Progress Notes (Signed)
Pt IV removed per NT, tolerated well.  Reviewed discharge information with pt and answered all questions at this time.

## 2016-09-06 ENCOUNTER — Emergency Department (HOSPITAL_COMMUNITY)
Admission: EM | Admit: 2016-09-06 | Discharge: 2016-09-06 | Disposition: A | Discharge status: Home / Self Care | Payer: Medicare Other | Attending: Emergency Medicine | Admitting: Emergency Medicine

## 2016-09-06 ENCOUNTER — Encounter (HOSPITAL_COMMUNITY): Payer: Self-pay | Admitting: Emergency Medicine

## 2016-09-06 ENCOUNTER — Emergency Department (HOSPITAL_COMMUNITY): Payer: Medicare Other

## 2016-09-06 DIAGNOSIS — I10 Essential (primary) hypertension: Secondary | ICD-10-CM | POA: Diagnosis not present

## 2016-09-06 DIAGNOSIS — J40 Bronchitis, not specified as acute or chronic: Secondary | ICD-10-CM | POA: Diagnosis not present

## 2016-09-06 DIAGNOSIS — F17210 Nicotine dependence, cigarettes, uncomplicated: Secondary | ICD-10-CM | POA: Insufficient documentation

## 2016-09-06 DIAGNOSIS — Z79899 Other long term (current) drug therapy: Secondary | ICD-10-CM | POA: Diagnosis not present

## 2016-09-06 DIAGNOSIS — J449 Chronic obstructive pulmonary disease, unspecified: Secondary | ICD-10-CM | POA: Diagnosis not present

## 2016-09-06 DIAGNOSIS — Z792 Long term (current) use of antibiotics: Secondary | ICD-10-CM | POA: Insufficient documentation

## 2016-09-06 DIAGNOSIS — R05 Cough: Secondary | ICD-10-CM | POA: Diagnosis not present

## 2016-09-06 MED ORDER — AZITHROMYCIN 250 MG PO TABS: ORAL_TABLET | ORAL | 0 refills | Status: DC

## 2016-09-06 MED ORDER — ALBUTEROL SULFATE HFA 108 (90 BASE) MCG/ACT IN AERS: 1.0000 | INHALATION_SPRAY | Freq: Four times a day (QID) | RESPIRATORY_TRACT | 0 refills | Status: DC | PRN

## 2016-09-06 MED ORDER — BENZONATATE 200 MG PO CAPS: 200.0000 mg | ORAL_CAPSULE | Freq: Three times a day (TID) | ORAL | 0 refills | Status: DC | PRN

## 2016-09-06 NOTE — Discharge Instructions (Signed)
Stop any OTC cold and cough medications.  Drink plenty of water.  Follow-up with your primary doctor for recheck

## 2016-09-06 NOTE — ED Triage Notes (Signed)
Pt had cough x 3 weeks, non productive, pt has not tried any medication.

## 2016-09-08 ENCOUNTER — Emergency Department (HOSPITAL_COMMUNITY)
Admission: EM | Admit: 2016-09-08 | Discharge: 2016-09-09 | Disposition: A | Discharge status: Home / Self Care | Payer: Medicare Other | Attending: Emergency Medicine | Admitting: Emergency Medicine

## 2016-09-08 ENCOUNTER — Encounter (HOSPITAL_COMMUNITY): Payer: Self-pay

## 2016-09-08 ENCOUNTER — Emergency Department (HOSPITAL_COMMUNITY): Payer: Medicare Other

## 2016-09-08 DIAGNOSIS — Z79899 Other long term (current) drug therapy: Secondary | ICD-10-CM | POA: Diagnosis not present

## 2016-09-08 DIAGNOSIS — J441 Chronic obstructive pulmonary disease with (acute) exacerbation: Secondary | ICD-10-CM

## 2016-09-08 DIAGNOSIS — I10 Essential (primary) hypertension: Secondary | ICD-10-CM | POA: Diagnosis not present

## 2016-09-08 DIAGNOSIS — R05 Cough: Secondary | ICD-10-CM | POA: Diagnosis not present

## 2016-09-08 DIAGNOSIS — J449 Chronic obstructive pulmonary disease, unspecified: Secondary | ICD-10-CM | POA: Diagnosis not present

## 2016-09-08 DIAGNOSIS — F17210 Nicotine dependence, cigarettes, uncomplicated: Secondary | ICD-10-CM | POA: Diagnosis not present

## 2016-09-08 DIAGNOSIS — Z792 Long term (current) use of antibiotics: Secondary | ICD-10-CM | POA: Insufficient documentation

## 2016-09-08 DIAGNOSIS — R0602 Shortness of breath: Secondary | ICD-10-CM | POA: Diagnosis not present

## 2016-09-08 DIAGNOSIS — R062 Wheezing: Secondary | ICD-10-CM | POA: Diagnosis not present

## 2016-09-08 MED ORDER — PREDNISONE 50 MG PO TABS
60.0000 mg | ORAL_TABLET | Freq: Once | ORAL | Status: AC
  Administered 2016-09-09: 60 mg via ORAL
  Filled 2016-09-08: qty 1

## 2016-09-08 MED ORDER — ALBUTEROL SULFATE (2.5 MG/3ML) 0.083% IN NEBU: 5.0000 mg | INHALATION_SOLUTION | Freq: Once | RESPIRATORY_TRACT | Status: DC

## 2016-09-08 MED ORDER — IPRATROPIUM BROMIDE 0.02 % IN SOLN: 0.5000 mg | Freq: Once | RESPIRATORY_TRACT | Status: DC

## 2016-09-08 NOTE — ED Triage Notes (Signed)
Persistent cough x 1 week, states she has coughed since being disharged a week ago.

## 2016-09-09 DIAGNOSIS — J441 Chronic obstructive pulmonary disease with (acute) exacerbation: Secondary | ICD-10-CM | POA: Diagnosis not present

## 2016-09-09 MED ORDER — IPRATROPIUM-ALBUTEROL 0.5-2.5 (3) MG/3ML IN SOLN
RESPIRATORY_TRACT | Status: AC
  Filled 2016-09-09: qty 3

## 2016-09-09 MED ORDER — ALBUTEROL SULFATE (2.5 MG/3ML) 0.083% IN NEBU
INHALATION_SOLUTION | RESPIRATORY_TRACT | Status: AC
  Filled 2016-09-09: qty 3

## 2016-09-09 MED ORDER — BENZONATATE 100 MG PO CAPS: 100.0000 mg | ORAL_CAPSULE | Freq: Three times a day (TID) | ORAL | 0 refills | Status: DC | PRN

## 2016-09-09 MED ORDER — IPRATROPIUM-ALBUTEROL 0.5-2.5 (3) MG/3ML IN SOLN
3.0000 mL | Freq: Once | RESPIRATORY_TRACT | Status: AC
  Administered 2016-09-09: 3 mL via RESPIRATORY_TRACT

## 2016-09-09 MED ORDER — PREDNISONE 20 MG PO TABS: 40.0000 mg | ORAL_TABLET | Freq: Every day | ORAL | 0 refills | Status: DC

## 2016-09-09 MED ORDER — ALBUTEROL SULFATE (2.5 MG/3ML) 0.083% IN NEBU
2.5000 mg | INHALATION_SOLUTION | Freq: Once | RESPIRATORY_TRACT | Status: AC
  Administered 2016-09-09: 2.5 mg via RESPIRATORY_TRACT

## 2016-09-09 NOTE — ED Provider Notes (Signed)
[facility] DEPT Provider Note   CSN: [id number] Arrival date & time: 09/06/16  1753     History   Chief Complaint Chief Complaint  Patient presents with  . Cough    HPI Virginia Washington is a 59 y.o. female.  HPI   Virginia Washington is a 60 y.o. female who presents to the Emergency Department complaining of non-productive cough for 3 weeks.  She also complains of nasal congestion and intermittent wheezing.  She denies chest pain, shortness of breath, fever, chills.  She has not tried any cold or cough medications   Past Medical History:  Diagnosis Date  . Chronic back pain   . COPD (chronic obstructive pulmonary disease) ([place])   . Headache   . Hyperlipidemia   . Hypertension   . Normal cardiac stress test 12/2014    Patient Active Problem List   Diagnosis Date Noted  . SBO (small bowel obstruction) ([place]) 08/16/2016  . Hyperlipidemia 08/16/2016  . Chest pain 12/12/2014  . Hypertension 12/12/2014  . Tobacco abuse 10/29/2011    Past Surgical History:  Procedure Laterality Date  . ABDOMINAL HYSTERECTOMY    . CHOLECYSTECTOMY      OB History    Gravida Para Term Preterm AB Living             3   SAB TAB Ectopic Multiple Live Births                   Home Medications    Prior to Admission medications   Medication Sig Start Date End Date Taking? Authorizing Provider  albuterol (PROVENTIL HFA;VENTOLIN HFA) 108 (90 Base) MCG/ACT inhaler Inhale 1-2 puffs into the lungs every 6 (six) hours as needed for wheezing or shortness of breath. 09/06/16   Nikayla Madaris, PA-C  atorvastatin (LIPITOR) 20 MG tablet Take 1 tablet (20 mg total) by mouth daily. 12/23/14   Fay Records, MD  azithromycin (ZITHROMAX) 250 MG tablet Take first 2 tablets together, then 1 every day until finished. 09/06/16   Seraphim Trow, PA-C  benzonatate (TESSALON) 100 MG capsule Take 1 capsule (100 mg total) by mouth 3 (three) times daily as needed for cough. 09/09/16   Waynetta Pean, PA-C    guaiFENesin-dextromethorphan (ROBITUSSIN DM) 100-10 MG/5ML syrup Take 5 mLs by mouth every 4 (four) hours as needed for cough. 08/20/16   Sinda Du, MD  hydrOXYzine (VISTARIL) 25 MG capsule Take 1 capsule (25 mg total) by mouth every 6 (six) hours as needed. 07/30/16   Lily Kocher, PA-C  metoprolol tartrate (LOPRESSOR) 25 MG tablet Take 1 tablet (25 mg total) by mouth 2 (two) times daily. 08/20/16   Sinda Du, MD  predniSONE (DELTASONE) 20 MG tablet Take 2 tablets (40 mg total) by mouth daily. 09/09/16   Waynetta Pean, PA-C    Family History Family History  Problem Relation Age of Onset  . Hypertension Mother     Social History Social History  Substance Use Topics  . Smoking status: Current Every Day Smoker    Packs/day: 0.50    Years: 40.00    Types: Cigarettes  . Smokeless tobacco: Never Used  . Alcohol use No     Allergies   Review of patient's allergies indicates no known allergies.   Review of Systems Review of Systems  Constitutional: Negative for appetite change, chills and fever.  HENT: Positive for congestion and rhinorrhea. Negative for sore throat and trouble swallowing.   Respiratory: Positive for cough, chest tightness and wheezing.  Negative for shortness of breath.   Cardiovascular: Negative for chest pain and leg swelling.  Gastrointestinal: Negative for abdominal pain, nausea and vomiting.  Genitourinary: Negative for dysuria.  Musculoskeletal: Negative for arthralgias.  Skin: Negative for rash.  Neurological: Negative for dizziness, weakness and numbness.  Hematological: Negative for adenopathy.  All other systems reviewed and are negative.    Physical Exam Updated Vital Signs BP 116/95 (BP Location: Left Arm)   Pulse 79   Temp 98 F (36.7 C) (Oral)   Resp 16   Ht 5\' 2"  (1.575 m)   Wt 58.6 kg   SpO2 99%   BMI 23.61 kg/m   Physical Exam  Constitutional: She is oriented to person, place, and time. She appears well-developed and  well-nourished. No distress.  HENT:  Head: Normocephalic and atraumatic.  Right Ear: Tympanic membrane and ear canal normal.  Left Ear: Tympanic membrane and ear canal normal.  Mouth/Throat: Uvula is midline, oropharynx is clear and moist and mucous membranes are normal. No oropharyngeal exudate.  Eyes: EOM are normal. Pupils are equal, round, and reactive to light.  Neck: Normal range of motion, full passive range of motion without pain and phonation normal. Neck supple.  Cardiovascular: Normal rate, regular rhythm and intact distal pulses.   No murmur heard. Pulmonary/Chest: Effort normal. No stridor. No respiratory distress. She has wheezes. She has no rales. She exhibits no tenderness.  Coarse lungs sounds bilaterally with few scattered expiratory wheezes.  No rales or respir distress.  Musculoskeletal: She exhibits no edema.  Lymphadenopathy:    She has no cervical adenopathy.  Neurological: She is alert and oriented to person, place, and time. She exhibits normal muscle tone. Coordination normal.  Skin: Skin is warm and dry.  Nursing note and vitals reviewed.    ED Treatments / Results  Labs (all labs ordered are listed, but only abnormal results are displayed) Labs Reviewed - No data to display  EKG  EKG Interpretation None       Radiology Dg Chest 2 View  Result Date: 09/08/2016 CLINICAL DATA:  Productive cough and shortness of breath for 1 week. History of COPD and hypertension. EXAM: CHEST  2 VIEW COMPARISON:  09/06/2016 FINDINGS: Normal heart size and pulmonary vascularity. Central interstitial changes in the lungs consistent with chronic bronchitis. No focal airspace disease or consolidation. No blunting of costophrenic angles. No pneumothorax. Slightly tortuous aorta. Surgical clips in the right upper quadrant. No change since prior study. IMPRESSION: Chronic bronchitic changes in the lungs. No evidence of active pulmonary disease. Electronically Signed   By: Lucienne Capers M.D.   On: 09/08/2016 23:50    Procedures Procedures (including critical care time)  Medications Ordered in ED Medications - No data to display   Initial Impression / Assessment and Plan / ED Course  I have reviewed the triage vital signs and the nursing notes.  Pertinent labs & imaging results that were available during my care of the patient were reviewed by me and considered in my medical decision making (see chart for details).  Clinical Course    Pt is non-toxic appearing.  Vitals stable.  Agrees to tx plan and close PMD f/u.    Final Clinical Impressions(s) / ED Diagnoses   Final diagnoses:  Bronchitis    New Prescriptions Discharge Medication List as of 09/06/2016  8:24 PM    START taking these medications   Details  albuterol (PROVENTIL HFA;VENTOLIN HFA) 108 (90 Base) MCG/ACT inhaler Inhale 1-2 puffs into the  lungs every 6 (six) hours as needed for wheezing or shortness of breath., Starting Wed 09/06/2016, Print    azithromycin (ZITHROMAX) 250 MG tablet Take first 2 tablets together, then 1 every day until finished., Print    benzonatate (TESSALON) 200 MG capsule Take 1 capsule (200 mg total) by mouth 3 (three) times daily as needed for cough. Swallow whole, do not chew, Starting Wed 09/06/2016, Print         Naasia Weilbacher Ammie Ferrier 09/09/16 2125    Milton Ferguson, MD 09/11/16 2003

## 2016-09-09 NOTE — ED Provider Notes (Signed)
[facility] DEPT Provider Note   CSN: [id number] Arrival date & time: 09/08/16  2307     History   Chief Complaint Chief Complaint  Patient presents with  . Cough    HPI Virginia Washington is a 60 y.o. female.  Virginia Washington is a 60 y.o. Female who presents to the emergency department complaining of productive cough for the past week. She has a history of COPD. She is a smoker. She denies shortness of breath or chest pain. She reports lots of coughing that is worse at night. She denies hemoptysis or fevers. She does have an albuterol inhaler at home and reports this helps some with her coughing during the day. Patient denies fevers, sneezing, nasal congestion, shortness of breath, chest pain, hemoptysis, abdominal pain, nausea, vomiting, rashes.    The history is provided by the patient. No language interpreter was used.  Cough  Associated symptoms include wheezing. Pertinent negatives include no chest pain, no chills, no headaches, no rhinorrhea, no sore throat and no shortness of breath.    Past Medical History:  Diagnosis Date  . Chronic back pain   . COPD (chronic obstructive pulmonary disease) ([place])   . Headache   . Hyperlipidemia   . Hypertension   . Normal cardiac stress test 12/2014    Patient Active Problem List   Diagnosis Date Noted  . SBO (small bowel obstruction) ([place]) 08/16/2016  . Hyperlipidemia 08/16/2016  . Chest pain 12/12/2014  . Hypertension 12/12/2014  . Tobacco abuse 10/29/2011    Past Surgical History:  Procedure Laterality Date  . ABDOMINAL HYSTERECTOMY    . CHOLECYSTECTOMY      OB History    Gravida Para Term Preterm AB Living             3   SAB TAB Ectopic Multiple Live Births                   Home Medications    Prior to Admission medications   Medication Sig Start Date End Date Taking? Authorizing Provider  albuterol (PROVENTIL HFA;VENTOLIN HFA) 108 (90 Base) MCG/ACT inhaler Inhale 1-2 puffs into the lungs every 6  (six) hours as needed for wheezing or shortness of breath. 09/06/16   Tammy Triplett, PA-C  atorvastatin (LIPITOR) 20 MG tablet Take 1 tablet (20 mg total) by mouth daily. 12/23/14   Fay Records, MD  azithromycin (ZITHROMAX) 250 MG tablet Take first 2 tablets together, then 1 every day until finished. 09/06/16   Tammy Triplett, PA-C  benzonatate (TESSALON) 100 MG capsule Take 1 capsule (100 mg total) by mouth 3 (three) times daily as needed for cough. 09/09/16   Waynetta Pean, PA-C  guaiFENesin-dextromethorphan (ROBITUSSIN DM) 100-10 MG/5ML syrup Take 5 mLs by mouth every 4 (four) hours as needed for cough. 08/20/16   Sinda Du, MD  hydrOXYzine (VISTARIL) 25 MG capsule Take 1 capsule (25 mg total) by mouth every 6 (six) hours as needed. 07/30/16   Lily Kocher, PA-C  metoprolol tartrate (LOPRESSOR) 25 MG tablet Take 1 tablet (25 mg total) by mouth 2 (two) times daily. 08/20/16   Sinda Du, MD  predniSONE (DELTASONE) 20 MG tablet Take 2 tablets (40 mg total) by mouth daily. 09/09/16   Waynetta Pean, PA-C    Family History Family History  Problem Relation Age of Onset  . Hypertension Mother     Social History Social History  Substance Use Topics  . Smoking status: Current Every Day Smoker  Packs/day: 0.50    Years: 40.00    Types: Cigarettes  . Smokeless tobacco: Never Used  . Alcohol use No     Allergies   Review of patient's allergies indicates no known allergies.   Review of Systems Review of Systems  Constitutional: Negative for chills and fever.  HENT: Negative for congestion, postnasal drip, rhinorrhea, sneezing and sore throat.   Eyes: Negative for visual disturbance.  Respiratory: Positive for cough and wheezing. Negative for shortness of breath.   Cardiovascular: Negative for chest pain and palpitations.  Gastrointestinal: Negative for abdominal pain, diarrhea, nausea and vomiting.  Genitourinary: Negative for dysuria.  Musculoskeletal: Negative for back pain and  neck pain.  Skin: Negative for rash.  Neurological: Negative for syncope, light-headedness and headaches.     Physical Exam Updated Vital Signs BP 123/82   Pulse 79   Temp 98.1 F (36.7 C) (Oral)   Resp 24   Ht 5\' 2"  (1.575 m)   Wt 56.7 kg   SpO2 97%   BMI 22.86 kg/m   Physical Exam  Constitutional: She appears well-developed and well-nourished. No distress.  Nontoxic appearing.  HENT:  Head: Normocephalic and atraumatic.  Mouth/Throat: Oropharynx is clear and moist.  Eyes: Conjunctivae are normal. Pupils are equal, round, and reactive to light. Right eye exhibits no discharge. Left eye exhibits no discharge.  Neck: Neck supple.  Cardiovascular: Normal rate, regular rhythm, normal heart sounds and intact distal pulses.   Pulmonary/Chest: Effort normal. No respiratory distress. She has wheezes. She has no rales.  Slight wheezes noted bilaterally. No increased work of breathing. No rales or rhonchi.  Abdominal: Soft. There is no tenderness. There is no guarding.  Abdomen is soft and nontender.  Musculoskeletal: She exhibits no edema.  No lower extremity edema.  Lymphadenopathy:    She has no cervical adenopathy.  Neurological: She is alert. Coordination normal.  Skin: Skin is warm and dry. Capillary refill takes less than 2 seconds. No rash noted. She is not diaphoretic. No erythema. No pallor.  Psychiatric: She has a normal mood and affect. Her behavior is normal.  Nursing note and vitals reviewed.    ED Treatments / Results  Labs (all labs ordered are listed, but only abnormal results are displayed) Labs Reviewed - No data to display  EKG  EKG Interpretation None       Radiology Dg Chest 2 View  Result Date: 09/08/2016 CLINICAL DATA:  Productive cough and shortness of breath for 1 week. History of COPD and hypertension. EXAM: CHEST  2 VIEW COMPARISON:  09/06/2016 FINDINGS: Normal heart size and pulmonary vascularity. Central interstitial changes in the lungs  consistent with chronic bronchitis. No focal airspace disease or consolidation. No blunting of costophrenic angles. No pneumothorax. Slightly tortuous aorta. Surgical clips in the right upper quadrant. No change since prior study. IMPRESSION: Chronic bronchitic changes in the lungs. No evidence of active pulmonary disease. Electronically Signed   By: Lucienne Capers M.D.   On: 09/08/2016 23:50    Procedures Procedures (including critical care time)  Medications Ordered in ED Medications  ipratropium-albuterol (DUONEB) 0.5-2.5 (3) MG/3ML nebulizer solution (  Not Given 09/09/16 0014)  albuterol (PROVENTIL) (2.5 MG/3ML) 0.083% nebulizer solution (2.5 mg  Not Given 09/09/16 0015)  predniSONE (DELTASONE) tablet 60 mg (60 mg Oral Given 09/09/16 0006)  ipratropium-albuterol (DUONEB) 0.5-2.5 (3) MG/3ML nebulizer solution 3 mL (3 mLs Nebulization Given 09/09/16 0011)  albuterol (PROVENTIL) (2.5 MG/3ML) 0.083% nebulizer solution 2.5 mg (2.5 mg Nebulization Given 09/09/16  0011)     Initial Impression / Assessment and Plan / ED Course  I have reviewed the triage vital signs and the nursing notes.  Pertinent labs & imaging results that were available during my care of the patient were reviewed by me and considered in my medical decision making (see chart for details).  Clinical Course   This is a 60 y.o. Female who presents to the emergency department complaining of productive cough for the past week. She has a history of COPD. She is a smoker. She denies shortness of breath or chest pain. She reports lots of coughing that is worse at night. She denies hemoptysis or fevers. She does have an albuterol inhaler at home and reports this helps some with her coughing during the day. On examination is afebrile nontoxic appearing. She has scattered wheezes noted bilaterally. No increased work of breathing. No lower extremity edema. Chest x-ray shows chronic bronchitic changes in the lungs. No evidence of active  pulmonary disease.  Patient was COPD exacerbation. Patient received albuterol treatment and oral prednisone. I recheck no wheezing noted. Patient reports she is feeling better. She's had no chest pain or shortness of breath. More wheezing and coughing. We'll discharge her with prednisone and Tessalon Perles. I encouraged her to follow-up closely with her primary care doctor to discuss possibly being started on inhaled corticosteroid for her COPD. I encouraged her to use her albuterol inhaler as needed. She reports she has plenty at home. I encouraged her to stop smoking. I discussed that if she requires her albuterol inhaler more than every 4 hours she needs to return to the emergency department for reevaluation. I discussed strict and specific return precautions. I advised the patient to follow-up with their primary care provider this week. I advised the patient to return to the emergency department with new or worsening symptoms or new concerns. The patient verbalized understanding and agreement with plan.    Final Clinical Impressions(s) / ED Diagnoses   Final diagnoses:  COPD exacerbation (HCC)    New Prescriptions New Prescriptions   BENZONATATE (TESSALON) 100 MG CAPSULE    Take 1 capsule (100 mg total) by mouth 3 (three) times daily as needed for cough.   PREDNISONE (DELTASONE) 20 MG TABLET    Take 2 tablets (40 mg total) by mouth daily.     Waynetta Pean, PA-C [id number]    Delora Fuel, MD [id number]

## 2016-09-09 NOTE — ED Notes (Signed)
PA in to reassess 

## 2016-09-09 NOTE — ED Notes (Signed)
Respiratory in to provide breathing treatment

## 2016-09-11 DIAGNOSIS — J441 Chronic obstructive pulmonary disease with (acute) exacerbation: Secondary | ICD-10-CM | POA: Diagnosis not present

## 2016-09-11 DIAGNOSIS — F17200 Nicotine dependence, unspecified, uncomplicated: Secondary | ICD-10-CM | POA: Diagnosis not present

## 2016-10-06 ENCOUNTER — Emergency Department (HOSPITAL_COMMUNITY): Payer: Medicare Other

## 2016-10-06 ENCOUNTER — Encounter (HOSPITAL_COMMUNITY): Payer: Self-pay | Admitting: Emergency Medicine

## 2016-10-06 ENCOUNTER — Emergency Department (HOSPITAL_COMMUNITY)
Admission: EM | Admit: 2016-10-06 | Discharge: 2016-10-07 | Disposition: A | Discharge status: Home / Self Care | Payer: Medicare Other | Attending: Emergency Medicine | Admitting: Emergency Medicine

## 2016-10-06 DIAGNOSIS — I10 Essential (primary) hypertension: Secondary | ICD-10-CM | POA: Insufficient documentation

## 2016-10-06 DIAGNOSIS — J209 Acute bronchitis, unspecified: Secondary | ICD-10-CM | POA: Diagnosis not present

## 2016-10-06 DIAGNOSIS — Z79899 Other long term (current) drug therapy: Secondary | ICD-10-CM | POA: Insufficient documentation

## 2016-10-06 DIAGNOSIS — F17210 Nicotine dependence, cigarettes, uncomplicated: Secondary | ICD-10-CM | POA: Insufficient documentation

## 2016-10-06 DIAGNOSIS — R05 Cough: Secondary | ICD-10-CM | POA: Diagnosis present

## 2016-10-06 DIAGNOSIS — J449 Chronic obstructive pulmonary disease, unspecified: Secondary | ICD-10-CM | POA: Diagnosis not present

## 2016-10-06 DIAGNOSIS — R0602 Shortness of breath: Secondary | ICD-10-CM | POA: Diagnosis not present

## 2016-10-06 MED ORDER — IPRATROPIUM-ALBUTEROL 0.5-2.5 (3) MG/3ML IN SOLN
3.0000 mL | Freq: Once | RESPIRATORY_TRACT | Status: AC
  Administered 2016-10-06: 3 mL via RESPIRATORY_TRACT
  Filled 2016-10-06: qty 3

## 2016-10-06 MED ORDER — PREDNISONE 50 MG PO TABS
60.0000 mg | ORAL_TABLET | Freq: Once | ORAL | Status: AC
  Administered 2016-10-07: 60 mg via ORAL
  Filled 2016-10-06: qty 1

## 2016-10-06 NOTE — ED Provider Notes (Signed)
[facility] DEPT Provider Note   CSN: [id number] Arrival date & time: 10/06/16  2249  By signing my name below, I, Hansel Feinstein, attest that this documentation has been prepared under the direction and in the presence of Delora Fuel, MD. Electronically Signed: Hansel Feinstein, ED Scribe. 10/06/16. 11:33 PM.     History   Chief Complaint Chief Complaint  Patient presents with  . Cough    HPI Virginia Washington is a 60 y.o. female with h/o COPD who presents to the Emergency Department complaining of intermittent, persistent cough onset this morning. Pt also complains of aching to BLE, 8/10 HA, rhinorrhea, congestion, occasional SOB. Pt describes her nasal discharge as white in coloration. She states she has used an inhaler with mild relief of symptoms. No known sick contacts with similar symptoms. Pt states she did not receive a flu shot this year. Pt is a current smoker, 4 cigarettes per day. She denies fever.    The history is provided by the patient. No language interpreter was used.    Past Medical History:  Diagnosis Date  . Chronic back pain   . COPD (chronic obstructive pulmonary disease) ([place])   . Headache   . Hyperlipidemia   . Hypertension   . Normal cardiac stress test 12/2014    Patient Active Problem List   Diagnosis Date Noted  . SBO (small bowel obstruction) 08/16/2016  . Hyperlipidemia 08/16/2016  . Chest pain 12/12/2014  . Hypertension 12/12/2014  . Tobacco abuse 10/29/2011    Past Surgical History:  Procedure Laterality Date  . ABDOMINAL HYSTERECTOMY    . CHOLECYSTECTOMY      OB History    Gravida Para Term Preterm AB Living             3   SAB TAB Ectopic Multiple Live Births                   Home Medications    Prior to Admission medications   Medication Sig Start Date End Date Taking? Authorizing Provider  albuterol (PROVENTIL HFA;VENTOLIN HFA) 108 (90 Base) MCG/ACT inhaler Inhale 1-2 puffs into the lungs every 6 (six) hours as needed for  wheezing or shortness of breath. 09/06/16   Tammy Triplett, PA-C  atorvastatin (LIPITOR) 20 MG tablet Take 1 tablet (20 mg total) by mouth daily. 12/23/14   Fay Records, MD  azithromycin (ZITHROMAX) 250 MG tablet Take first 2 tablets together, then 1 every day until finished. 09/06/16   Tammy Triplett, PA-C  benzonatate (TESSALON) 100 MG capsule Take 1 capsule (100 mg total) by mouth 3 (three) times daily as needed for cough. 09/09/16   Waynetta Pean, PA-C  guaiFENesin-dextromethorphan (ROBITUSSIN DM) 100-10 MG/5ML syrup Take 5 mLs by mouth every 4 (four) hours as needed for cough. 08/20/16   Sinda Du, MD  hydrOXYzine (VISTARIL) 25 MG capsule Take 1 capsule (25 mg total) by mouth every 6 (six) hours as needed. 07/30/16   Lily Kocher, PA-C  metoprolol tartrate (LOPRESSOR) 25 MG tablet Take 1 tablet (25 mg total) by mouth 2 (two) times daily. 08/20/16   Sinda Du, MD  predniSONE (DELTASONE) 20 MG tablet Take 2 tablets (40 mg total) by mouth daily. 09/09/16   Waynetta Pean, PA-C    Family History Family History  Problem Relation Age of Onset  . Hypertension Mother     Social History Social History  Substance Use Topics  . Smoking status: Current Every Day Smoker    Packs/day: 0.50  Years: 40.00    Types: Cigarettes  . Smokeless tobacco: Never Used  . Alcohol use No     Allergies   Review of patient's allergies indicates no known allergies.   Review of Systems Review of Systems  Constitutional: Negative for fever.  HENT: Positive for congestion and rhinorrhea.   Respiratory: Positive for cough and shortness of breath.   Musculoskeletal: Positive for myalgias.  Neurological: Positive for headaches.  All other systems reviewed and are negative.    Physical Exam Updated Vital Signs BP 130/71 (BP Location: Right Arm)   Pulse 84   Resp 16   Ht 5\' 2"  (1.575 m)   Wt 135 lb (61.2 kg)   SpO2 99%   BMI 24.69 kg/m   Physical Exam  Constitutional: She is oriented to  person, place, and time. She appears well-developed and well-nourished.  HENT:  Head: Normocephalic and atraumatic.  Eyes: EOM are normal. Pupils are equal, round, and reactive to light.  Neck: Normal range of motion. Neck supple. No JVD present.  Cardiovascular: Normal rate, regular rhythm and normal heart sounds.   No murmur heard. Pulmonary/Chest: Effort normal. She has wheezes. She has no rales. She exhibits no tenderness.  Prolonged exhalation phase with faint wheezes diffusely    Abdominal: Soft. Bowel sounds are normal. She exhibits no distension and no mass. There is no tenderness.  Musculoskeletal: Normal range of motion. She exhibits no edema.  Lymphadenopathy:    She has no cervical adenopathy.  Neurological: She is alert and oriented to person, place, and time. No cranial nerve deficit. She exhibits normal muscle tone. Coordination normal.  Skin: Skin is warm and dry. No rash noted.  Psychiatric: She has a normal mood and affect. Her behavior is normal. Judgment and thought content normal.  Nursing note and vitals reviewed.    ED Treatments / Results   DIAGNOSTIC STUDIES: Oxygen Saturation is 99% on RA, normal by my interpretation.    COORDINATION OF CARE: 11:31 PM Discussed treatment plan with pt at bedside which includes CXR and pt agreed to plan.    Radiology Dg Chest 2 View  Result Date: 10/07/2016 CLINICAL DATA:  Headache, shortness of breath, nonproductive cough beginning last night. History of COPD. EXAM: CHEST  2 VIEW COMPARISON:  Chest radiograph September 08, 2016 FINDINGS: Cardiomediastinal silhouette is normal. No pleural effusions or focal consolidations. Similar moderate bronchitic changes. Trachea projects midline and there is no pneumothorax. Soft tissue planes and included osseous structures are non-suspicious. Surgical clips in the included right abdomen compatible with cholecystectomy. IMPRESSION: Similar bronchitic changes without focal consolidation.  Electronically Signed   By: Elon Alas M.D.   On: 10/07/2016 01:35    Procedures Procedures (including critical care time)  Medications Ordered in ED Medications  ipratropium-albuterol (DUONEB) 0.5-2.5 (3) MG/3ML nebulizer solution 3 mL (3 mLs Nebulization Given 10/06/16 2354)  predniSONE (DELTASONE) tablet 60 mg (60 mg Oral Given 10/07/16 0010)     Initial Impression / Assessment and Plan / ED Course  I have reviewed the triage vital signs and the nursing notes.  Pertinent imaging results that were available during my care of the patient were reviewed by me and considered in my medical decision making (see chart for details).  Clinical Course   Respiratory tract infection with evidence of bronchospasm. Old records are reviewed, and she had an ED visit previously for COPD exacerbation. She will be given albuterol with ipratropium by nebulizer and will be sent for chest x-ray to rule out  pneumonia.  X-ray shows no evidence of pneumonia. She had excellent improvement with nebulizer treatment in the ED. She is discharged with prescription for prednisone.  Final Clinical Impressions(s) / ED Diagnoses   Final diagnoses:  Acute bronchitis, unspecified organism    New Prescriptions New Prescriptions   No medications on file    I personally performed the services described in this documentation, which was scribed in my presence. The recorded information has been reviewed and is accurate.       Delora Fuel, MD [id number]

## 2016-10-06 NOTE — ED Triage Notes (Signed)
Pt c/o cough and headache that started tonight.

## 2016-10-07 DIAGNOSIS — R05 Cough: Secondary | ICD-10-CM | POA: Diagnosis not present

## 2016-10-07 DIAGNOSIS — R0602 Shortness of breath: Secondary | ICD-10-CM | POA: Diagnosis not present

## 2016-10-07 DIAGNOSIS — J209 Acute bronchitis, unspecified: Secondary | ICD-10-CM | POA: Diagnosis not present

## 2016-10-07 MED ORDER — PREDNISONE 50 MG PO TABS: 50.0000 mg | ORAL_TABLET | Freq: Every day | ORAL | 0 refills | Status: DC

## 2016-10-07 NOTE — Discharge Instructions (Signed)
Continue using your inhaler every 4 hours as needed. Return if symptoms are worsening.

## 2016-10-07 NOTE — ED Notes (Signed)
Pt verbalized understanding of discharge instructions. Pt ambulatory to waiting room.  

## 2016-10-25 ENCOUNTER — Encounter (HOSPITAL_COMMUNITY): Payer: Self-pay | Admitting: Emergency Medicine

## 2016-10-25 ENCOUNTER — Emergency Department (HOSPITAL_COMMUNITY): Payer: Medicare Other

## 2016-10-25 ENCOUNTER — Emergency Department (HOSPITAL_COMMUNITY)
Admission: EM | Admit: 2016-10-25 | Discharge: 2016-10-26 | Disposition: A | Discharge status: Home / Self Care | Payer: Medicare Other | Attending: Emergency Medicine | Admitting: Emergency Medicine

## 2016-10-25 DIAGNOSIS — Z79899 Other long term (current) drug therapy: Secondary | ICD-10-CM | POA: Insufficient documentation

## 2016-10-25 DIAGNOSIS — R1084 Generalized abdominal pain: Secondary | ICD-10-CM | POA: Insufficient documentation

## 2016-10-25 DIAGNOSIS — J449 Chronic obstructive pulmonary disease, unspecified: Secondary | ICD-10-CM | POA: Diagnosis not present

## 2016-10-25 DIAGNOSIS — R109 Unspecified abdominal pain: Secondary | ICD-10-CM

## 2016-10-25 DIAGNOSIS — I10 Essential (primary) hypertension: Secondary | ICD-10-CM | POA: Diagnosis not present

## 2016-10-25 DIAGNOSIS — R079 Chest pain, unspecified: Secondary | ICD-10-CM | POA: Diagnosis not present

## 2016-10-25 DIAGNOSIS — R0789 Other chest pain: Secondary | ICD-10-CM

## 2016-10-25 DIAGNOSIS — K760 Fatty (change of) liver, not elsewhere classified: Secondary | ICD-10-CM | POA: Diagnosis not present

## 2016-10-25 DIAGNOSIS — R0602 Shortness of breath: Secondary | ICD-10-CM | POA: Diagnosis not present

## 2016-10-25 DIAGNOSIS — F17210 Nicotine dependence, cigarettes, uncomplicated: Secondary | ICD-10-CM | POA: Diagnosis not present

## 2016-10-25 LAB — CBC
HEMATOCRIT: 45.3 % (ref 36.0–46.0)
HEMOGLOBIN: 14.6 g/dL (ref 12.0–15.0)
MCH: 28.6 pg (ref 26.0–34.0)
MCHC: 32.2 g/dL (ref 30.0–36.0)
MCV: 88.8 fL (ref 78.0–100.0)
Platelets: 225 10*3/uL (ref 150–400)
RBC: 5.1 MIL/uL (ref 3.87–5.11)
RDW: 14.7 % (ref 11.5–15.5)
WBC: 7.8 10*3/uL (ref 4.0–10.5)

## 2016-10-25 LAB — COMPREHENSIVE METABOLIC PANEL
ALBUMIN: 4.1 g/dL (ref 3.5–5.0)
ALK PHOS: 89 U/L (ref 38–126)
ALT: 16 U/L (ref 14–54)
ANION GAP: 7 (ref 5–15)
AST: 17 U/L (ref 15–41)
BILIRUBIN TOTAL: 0.4 mg/dL (ref 0.3–1.2)
BUN: 13 mg/dL (ref 6–20)
CALCIUM: 9.1 mg/dL (ref 8.9–10.3)
CO2: 23 mmol/L (ref 22–32)
Chloride: 106 mmol/L (ref 101–111)
Creatinine, Ser: 0.92 mg/dL (ref 0.44–1.00)
GLUCOSE: 122 mg/dL — AB (ref 65–99)
POTASSIUM: 3.6 mmol/L (ref 3.5–5.1)
Sodium: 136 mmol/L (ref 135–145)
TOTAL PROTEIN: 7.4 g/dL (ref 6.5–8.1)

## 2016-10-25 LAB — LIPASE, BLOOD: Lipase: 39 U/L (ref 11–51)

## 2016-10-25 LAB — TROPONIN I

## 2016-10-25 MED ORDER — ONDANSETRON HCL 4 MG/2ML IJ SOLN
4.0000 mg | Freq: Once | INTRAMUSCULAR | Status: AC
  Administered 2016-10-26: 4 mg via INTRAVENOUS
  Filled 2016-10-25: qty 2

## 2016-10-25 MED ORDER — MORPHINE SULFATE (PF) 4 MG/ML IV SOLN
4.0000 mg | Freq: Once | INTRAVENOUS | Status: AC
  Administered 2016-10-26: 4 mg via INTRAVENOUS
  Filled 2016-10-25: qty 1

## 2016-10-25 MED ORDER — IOPAMIDOL (ISOVUE-300) INJECTION 61%
INTRAVENOUS | Status: AC
  Administered 2016-10-26: 30 mL
  Filled 2016-10-25: qty 30

## 2016-10-25 NOTE — ED Provider Notes (Signed)
By signing my name below, I, [name], attest that this documentation has been prepared under the direction and in the presence of [place], DO. Electronically signed, [name], ED Scribe. 10/25/16. 11:25 PM.  TIME SEEN: 11:44 PM  CHIEF COMPLAINT: Abdominal pain  HPI:  HPI Comments: Virginia Washington is a 60 y.o. female with Hx of hypertension, hyperlipidemia, COPD, SBO, who presents to the Emergency Department complaining of constant, generalized abdominal pain with associated intermittent chest pain that started around 2000 tonight. Pt describes her chest pain as sharp and only last for several seconds and then resolves. Pain is in the mid lower sternal area and the left chest without radiation. Denies associated shortness of breath, cough, lower extremity swelling or pain. No aggravating or relieving factors.   She describes the abdominal pain is diffuse, cramping. States she woke up and "my stomach was swole" at 8 PM tonight. She has had similar symptoms in the past with previous SBO. Pt's last bowel movement was yesterday, no hematochezia or melena. She reports fluctuance per normal. No recent constipation. No dysuria or hematuria. No vaginal bleeding or discharge. No radiation of pain. No aggravating or alleviating factors. She has had a cholecystectomy and hysterectomy.   ROS: See HPI Constitutional: no fever  Eyes: no drainage  ENT: no runny nose   Cardiovascular: + chest pain  Resp: no SOB  GI: no vomiting GU: no dysuria Integumentary: no rash  Allergy: no hives  Musculoskeletal: no leg swelling  Neurological: no slurred speech ROS otherwise negative  PAST MEDICAL HISTORY/PAST SURGICAL HISTORY:  Past Medical History:  Diagnosis Date  . Chronic back pain   . COPD (chronic obstructive pulmonary disease) ([place])   . Headache   . Hyperlipidemia   . Hypertension   . Normal cardiac stress test 12/2014    MEDICATIONS:  Prior to Admission medications   Medication  Sig Start Date End Date Taking? Authorizing Provider  albuterol (PROVENTIL HFA;VENTOLIN HFA) 108 (90 Base) MCG/ACT inhaler Inhale 1-2 puffs into the lungs every 6 (six) hours as needed for wheezing or shortness of breath. 09/06/16  Yes Tammy Triplett, PA-C  amLODipine (NORVASC) 5 MG tablet Take 5 mg by mouth daily. 09/11/16  Yes Historical Provider, MD  atorvastatin (LIPITOR) 20 MG tablet Take 1 tablet (20 mg total) by mouth daily. 12/23/14  Yes Fay Records, MD  metoprolol tartrate (LOPRESSOR) 25 MG tablet Take 1 tablet (25 mg total) by mouth 2 (two) times daily. 08/20/16  Yes Sinda Du, MD  azithromycin (ZITHROMAX) 250 MG tablet Take first 2 tablets together, then 1 every day until finished. Patient not taking: Reported on 10/25/2016 09/06/16   Tammy Triplett, PA-C  benzonatate (TESSALON) 100 MG capsule Take 1 capsule (100 mg total) by mouth 3 (three) times daily as needed for cough. Patient not taking: Reported on 10/25/2016 09/09/16   Waynetta Pean, PA-C  guaiFENesin-dextromethorphan (ROBITUSSIN DM) 100-10 MG/5ML syrup Take 5 mLs by mouth every 4 (four) hours as needed for cough. Patient not taking: Reported on 10/25/2016 08/20/16   Sinda Du, MD  hydrOXYzine (VISTARIL) 25 MG capsule Take 1 capsule (25 mg total) by mouth every 6 (six) hours as needed. Patient not taking: Reported on 10/25/2016 07/30/16   Lily Kocher, PA-C  predniSONE (DELTASONE) 50 MG tablet Take 1 tablet (50 mg total) by mouth daily. Patient not taking: Reported on 10/25/2016 [id number]   Delora Fuel, MD    ALLERGIES:  No Known Allergies  SOCIAL HISTORY:  Social History  Substance Use Topics  . Smoking status: Current Every Day Smoker    Packs/day: 0.50    Years: 40.00    Types: Cigarettes  . Smokeless tobacco: Never Used  . Alcohol use No    FAMILY HISTORY: Family History  Problem Relation Age of Onset  . Hypertension Mother     EXAM: BP 123/76 (BP Location: Left Arm)   Pulse 70   Temp 97.6 F (36.4 C)  (Oral)   Resp 20   Ht 5\' 2"  (1.575 m)   Wt 135 lb (61.2 kg)   SpO2 97%   BMI 24.69 kg/m  CONSTITUTIONAL: Alert and oriented and responds appropriately to questions. Well-appearing; well-nourished HEAD: Normocephalic EYES: Conjunctivae clear, PERRL, EOMI ENT: normal nose; no rhinorrhea; moist mucous membranes NECK: Supple, no meningismus, no nuchal rigidity, no LAD  CARD: RRR; S1 and S2 appreciated; no murmurs, no clicks, no rubs, no gallops CHEST:  Nontender to palpation without crepitus, ecchymosis or deformity RESP: Normal chest excursion without splinting or tachypnea; breath sounds clear and equal bilaterally; no wheezes, no rhonchi, no rales, no hypoxia or respiratory distress, speaking full sentences ABD/GI: Normal bowel sounds; mildly distended without tympany or fluid wave; soft, mildly tender to palpation diffusely, no rebound, no guarding, no peritoneal signs, no hepatosplenomegaly BACK:  The back appears normal and is non-tender to palpation, there is no CVA tenderness EXT: Normal ROM in all joints; non-tender to palpation; no edema; normal capillary refill; no cyanosis, no calf tenderness or swelling    SKIN: Normal color for age and race; warm; no rash NEURO: Moves all extremities equally, sensation to light touch intact diffusely, cranial nerves II through XII intact, normal speech PSYCH: The patient's mood and manner are appropriate. Grooming and personal hygiene are appropriate.  MEDICAL DECISION MAKING: Patient here with her atypical chest pain. Labs ordered in triage been unremarkable including negative troponin, clear chest x-ray. EKG shows no ischemic abnormality. Doubt PE, ACS, dissection. Will repeat second troponin given she does have some risk factors for cardiac disease below suspicion that this is ACS.   Complaining of diffuse abdominal pain and is mildly tender to palpation diffusely with minimal distention. We'll obtain CT scan for further evaluation. LFTs,  lipase, urine unremarkable.  ED PROGRESS: Patient's CT scan shows no acute abnormality. Second troponin negative. She is still having some epigastric pain. We'll treat with Protonix, GI cocktail and reassess.  Patient reports feeling better. Suspect this could be GERD, gastritis. Have recommended she start taking Protonix, eating a bland diet for the next several days, avoiding alcohol and NSAIDs. We'll discharge with Protonix, Zofran. Discussed return precautions. She verbalized understanding and is comfortable with this plan. She has a PCP, Dr. Legrand Rams for follow-up.  At this time, I do not feel there is any life-threatening condition present. I have reviewed and discussed all results (EKG, imaging, lab, urine as appropriate) and exam findings with patient/family. I have reviewed nursing notes and appropriate previous records.  I feel the patient is safe to be discharged home without further emergent workup and can continue workup as an outpatient as needed. Discussed usual and customary return precautions. Patient/family verbalize understanding and are comfortable with this plan.  Outpatient follow-up has been provided. All questions have been answered.   I personally performed the services described in this documentation, which was scribed in my presence. The recorded information has been reviewed and is accurate.     [place], DO 10/26/16 [phone]

## 2016-10-25 NOTE — ED Triage Notes (Signed)
Pt c/o generalized abd pain, chest discomfort, and hand itching.

## 2016-10-26 DIAGNOSIS — K760 Fatty (change of) liver, not elsewhere classified: Secondary | ICD-10-CM | POA: Diagnosis not present

## 2016-10-26 DIAGNOSIS — R1084 Generalized abdominal pain: Secondary | ICD-10-CM | POA: Diagnosis not present

## 2016-10-26 LAB — URINALYSIS, ROUTINE W REFLEX MICROSCOPIC
Bilirubin Urine: NEGATIVE
GLUCOSE, UA: NEGATIVE mg/dL
HGB URINE DIPSTICK: NEGATIVE
Ketones, ur: NEGATIVE mg/dL
Leukocytes, UA: NEGATIVE
Nitrite: NEGATIVE
Protein, ur: NEGATIVE mg/dL
SPECIFIC GRAVITY, URINE: 1.01 (ref 1.005–1.030)
pH: 6 (ref 5.0–8.0)

## 2016-10-26 LAB — TROPONIN I

## 2016-10-26 MED ORDER — ONDANSETRON 4 MG PO TBDP: 4.0000 mg | ORAL_TABLET | Freq: Three times a day (TID) | ORAL | 0 refills | Status: DC | PRN

## 2016-10-26 MED ORDER — IOPAMIDOL (ISOVUE-300) INJECTION 61%
100.0000 mL | Freq: Once | INTRAVENOUS | Status: AC | PRN
  Administered 2016-10-26: 100 mL via INTRAVENOUS

## 2016-10-26 MED ORDER — PANTOPRAZOLE SODIUM 40 MG PO TBEC
40.0000 mg | DELAYED_RELEASE_TABLET | Freq: Once | ORAL | Status: AC
  Administered 2016-10-26: 40 mg via ORAL
  Filled 2016-10-26: qty 1

## 2016-10-26 MED ORDER — PANTOPRAZOLE SODIUM 40 MG PO TBEC: 40.0000 mg | DELAYED_RELEASE_TABLET | Freq: Every day | ORAL | 1 refills | Status: DC

## 2016-10-26 MED ORDER — GI COCKTAIL ~~LOC~~
30.0000 mL | Freq: Once | ORAL | Status: AC
  Administered 2016-10-26: 30 mL via ORAL
  Filled 2016-10-26: qty 30

## 2016-10-26 NOTE — ED Notes (Signed)
Pt gone over to CT 

## 2016-12-04 ENCOUNTER — Other Ambulatory Visit (HOSPITAL_COMMUNITY): Payer: Self-pay | Admitting: Internal Medicine

## 2016-12-04 DIAGNOSIS — F17200 Nicotine dependence, unspecified, uncomplicated: Secondary | ICD-10-CM | POA: Diagnosis not present

## 2016-12-04 DIAGNOSIS — Z1231 Encounter for screening mammogram for malignant neoplasm of breast: Secondary | ICD-10-CM

## 2016-12-04 DIAGNOSIS — I10 Essential (primary) hypertension: Secondary | ICD-10-CM | POA: Diagnosis not present

## 2016-12-04 DIAGNOSIS — M1990 Unspecified osteoarthritis, unspecified site: Secondary | ICD-10-CM | POA: Diagnosis not present

## 2016-12-04 DIAGNOSIS — Z23 Encounter for immunization: Secondary | ICD-10-CM | POA: Diagnosis not present

## 2016-12-04 DIAGNOSIS — E785 Hyperlipidemia, unspecified: Secondary | ICD-10-CM | POA: Diagnosis not present

## 2016-12-05 IMAGING — DX DG KNEE COMPLETE 4+V*L*
4 series · 4 of 4 positions shown · non-contrast
Comparison: Left tib-fib series [DATE].

CLINICAL DATA: 58-year-old female with left anterior knee pain,
symptoms when walking. Pain for 2 months with no known injury.
Initial encounter.

EXAM:
LEFT KNEE - COMPLETE 4+ VIEW

[knee ap]
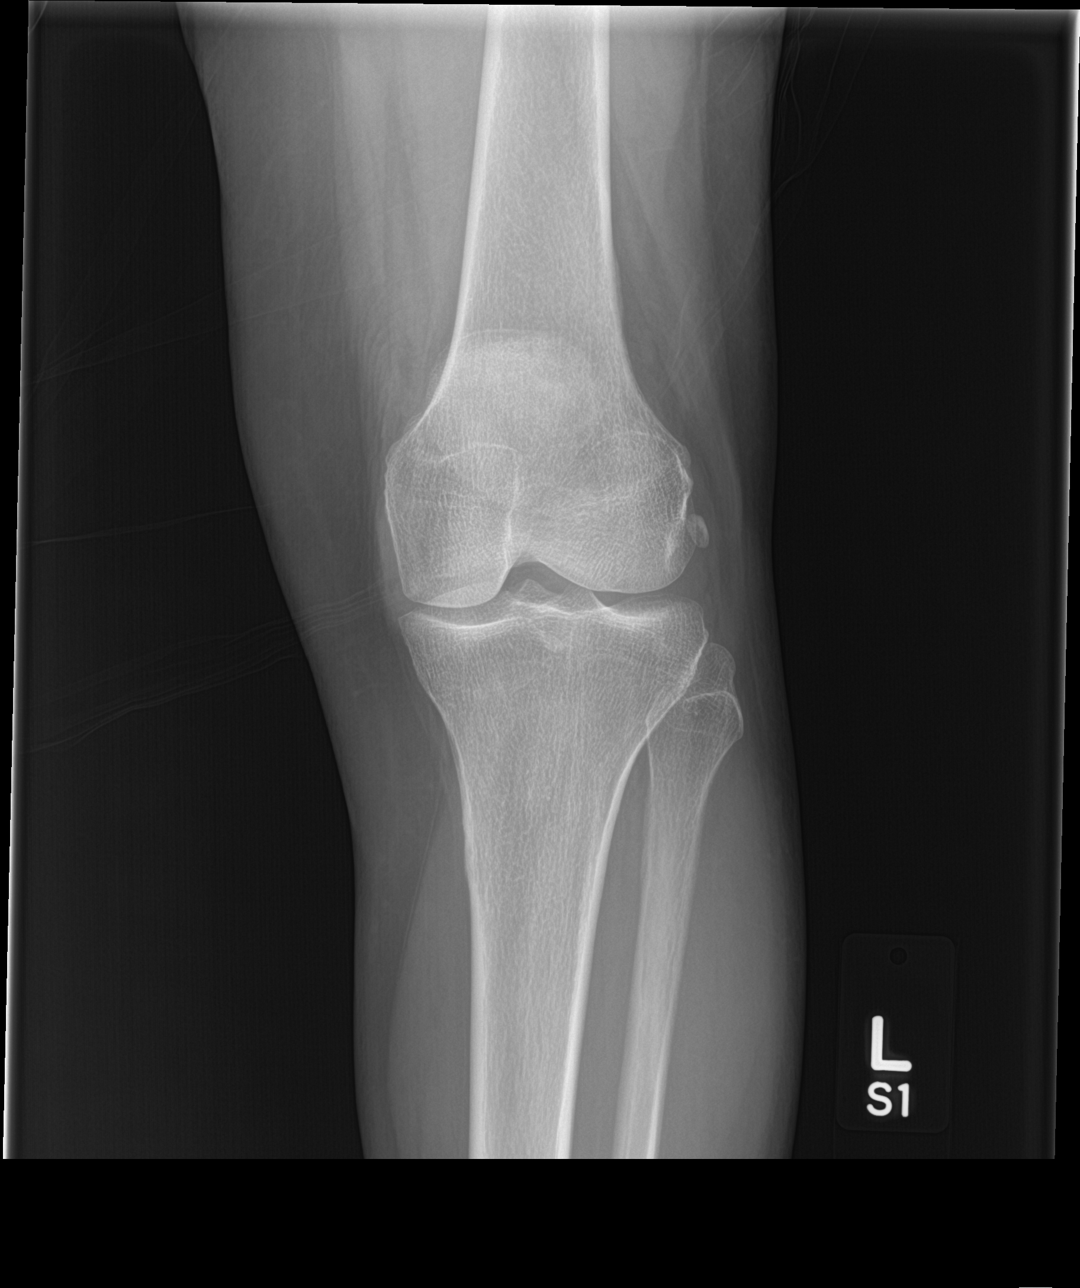

[knee obl]
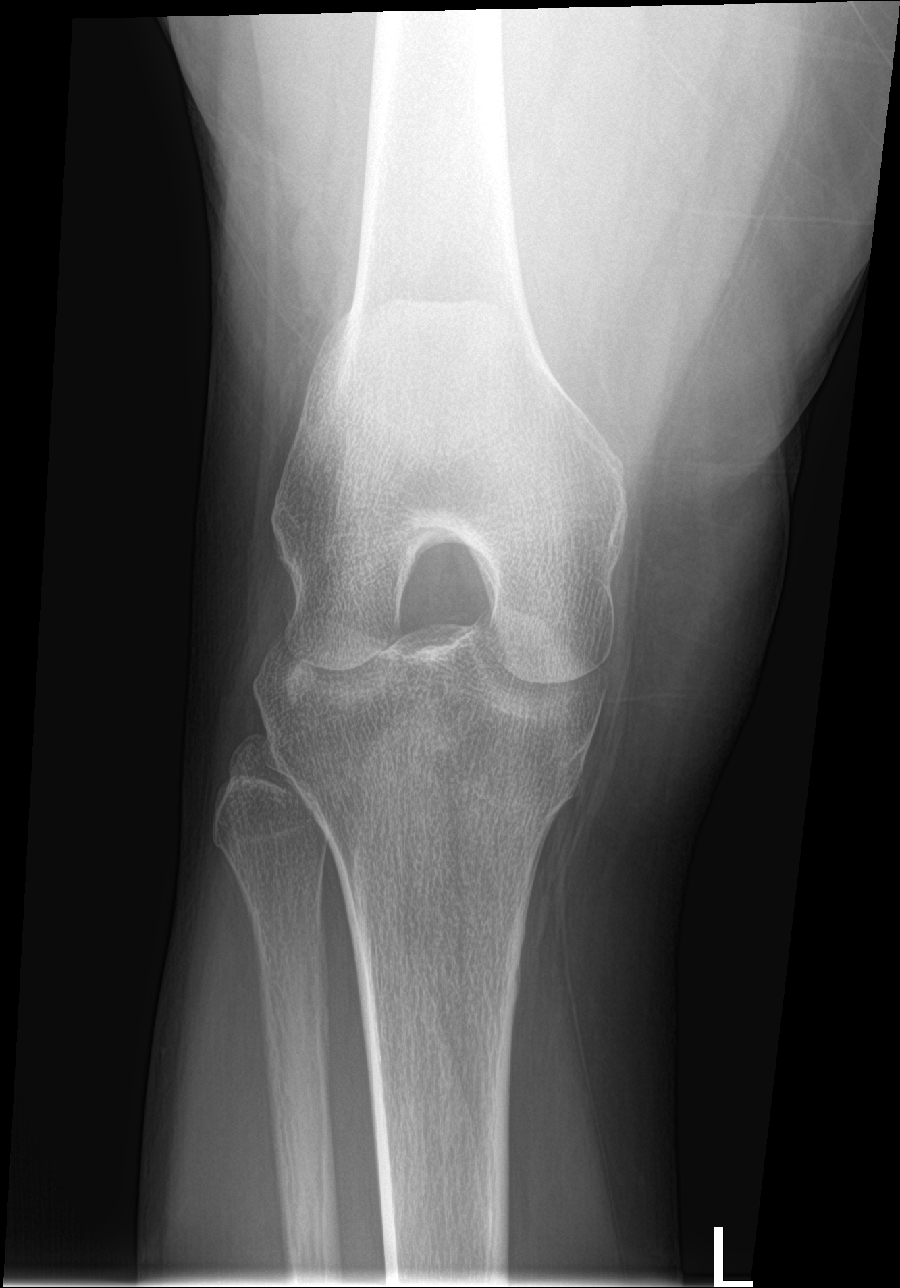

[knee lat]
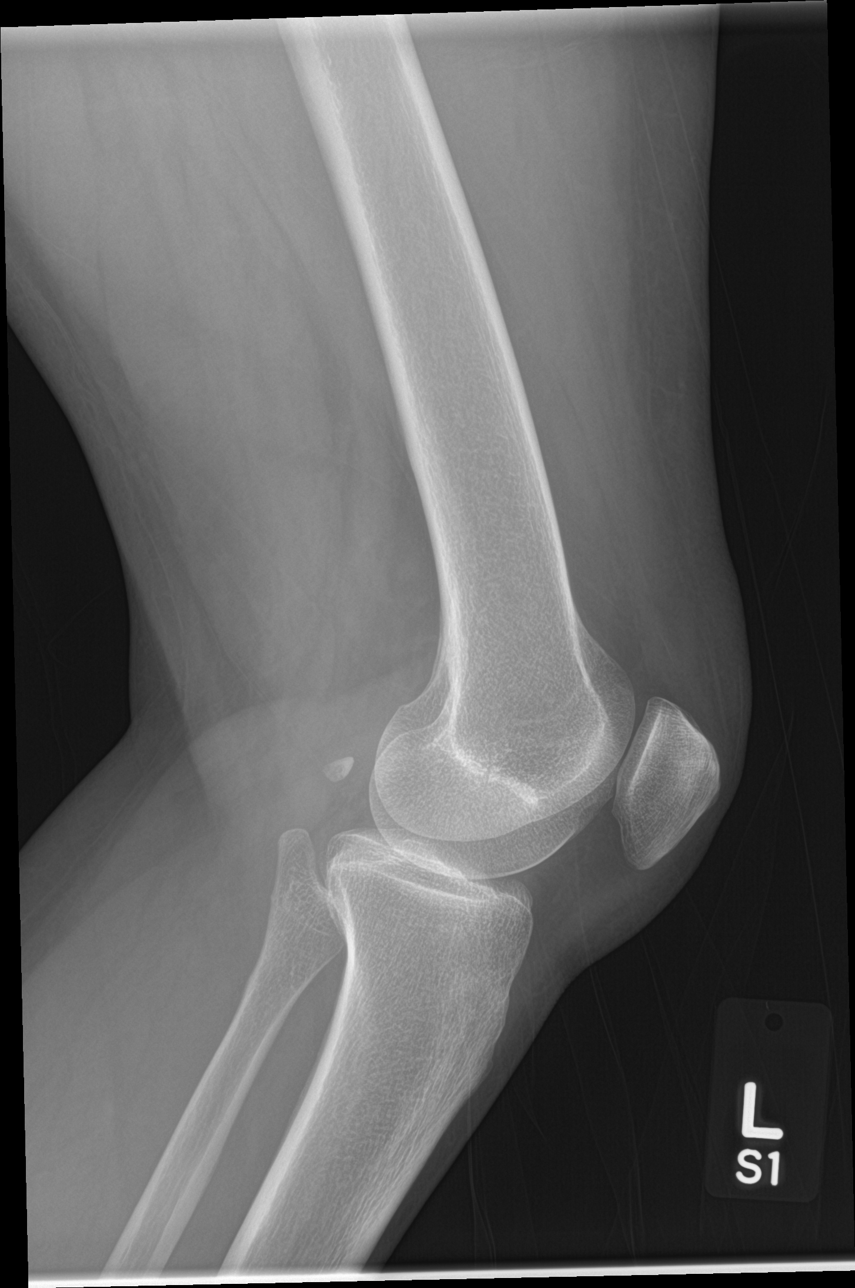

[knee sunrise]
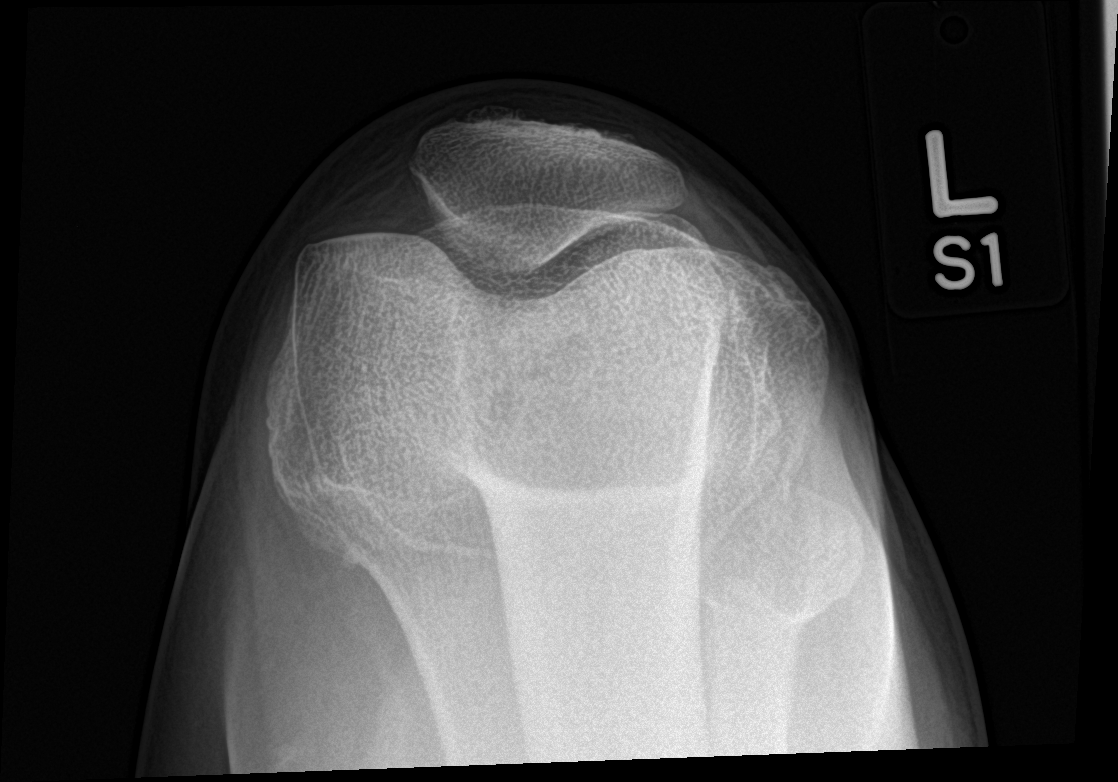

[4 of 4 positions shown; findings below may reference images not displayed]

FINDINGS: Weightbearing views. No joint effusion. The patella appears stable
and intact. Patellofemoral joint space preserved. Mild medial
compartment joint space loss. Minimal degenerative spurring. No
acute osseous abnormality identified.
IMPRESSION: Mild medial compartment joint space loss with no acute osseous
abnormality identified.

## 2016-12-13 ENCOUNTER — Ambulatory Visit (HOSPITAL_COMMUNITY)
Admission: RE | Admit: 2016-12-13 | Discharge: 2016-12-13 | Disposition: A | Discharge status: Home / Self Care | Payer: Medicare Other | Source: Ambulatory Visit | Attending: Internal Medicine | Admitting: Internal Medicine

## 2016-12-13 DIAGNOSIS — Z1231 Encounter for screening mammogram for malignant neoplasm of breast: Secondary | ICD-10-CM

## 2016-12-21 DIAGNOSIS — Z1211 Encounter for screening for malignant neoplasm of colon: Secondary | ICD-10-CM | POA: Diagnosis not present

## 2016-12-21 DIAGNOSIS — F17200 Nicotine dependence, unspecified, uncomplicated: Secondary | ICD-10-CM | POA: Diagnosis not present

## 2016-12-21 DIAGNOSIS — E785 Hyperlipidemia, unspecified: Secondary | ICD-10-CM | POA: Diagnosis not present

## 2016-12-21 DIAGNOSIS — M1990 Unspecified osteoarthritis, unspecified site: Secondary | ICD-10-CM | POA: Diagnosis not present

## 2017-02-07 ENCOUNTER — Encounter (HOSPITAL_COMMUNITY): Payer: Self-pay | Admitting: Emergency Medicine

## 2017-02-07 ENCOUNTER — Emergency Department (HOSPITAL_COMMUNITY)
Admission: EM | Admit: 2017-02-07 | Discharge: 2017-02-08 | Disposition: A | Discharge status: Home / Self Care | Payer: Medicare Other | Attending: Emergency Medicine | Admitting: Emergency Medicine

## 2017-02-07 DIAGNOSIS — Z79899 Other long term (current) drug therapy: Secondary | ICD-10-CM | POA: Diagnosis not present

## 2017-02-07 DIAGNOSIS — E876 Hypokalemia: Secondary | ICD-10-CM | POA: Diagnosis not present

## 2017-02-07 DIAGNOSIS — N390 Urinary tract infection, site not specified: Secondary | ICD-10-CM | POA: Diagnosis not present

## 2017-02-07 DIAGNOSIS — F17210 Nicotine dependence, cigarettes, uncomplicated: Secondary | ICD-10-CM | POA: Diagnosis not present

## 2017-02-07 DIAGNOSIS — R350 Frequency of micturition: Secondary | ICD-10-CM | POA: Diagnosis present

## 2017-02-07 DIAGNOSIS — J449 Chronic obstructive pulmonary disease, unspecified: Secondary | ICD-10-CM | POA: Diagnosis not present

## 2017-02-07 DIAGNOSIS — I10 Essential (primary) hypertension: Secondary | ICD-10-CM | POA: Diagnosis not present

## 2017-02-07 LAB — URINALYSIS, ROUTINE W REFLEX MICROSCOPIC
BILIRUBIN URINE: NEGATIVE
Bacteria, UA: NONE SEEN
Glucose, UA: NEGATIVE mg/dL
Hgb urine dipstick: NEGATIVE
Ketones, ur: 5 mg/dL — AB
Nitrite: NEGATIVE
PH: 5 (ref 5.0–8.0)
Protein, ur: NEGATIVE mg/dL
SPECIFIC GRAVITY, URINE: 1.028 (ref 1.005–1.030)

## 2017-02-07 LAB — BASIC METABOLIC PANEL
ANION GAP: 7 (ref 5–15)
BUN: 12 mg/dL (ref 6–20)
CHLORIDE: 107 mmol/L (ref 101–111)
CO2: 27 mmol/L (ref 22–32)
Calcium: 8.9 mg/dL (ref 8.9–10.3)
Creatinine, Ser: 0.96 mg/dL (ref 0.44–1.00)
GFR calc Af Amer: >60 mL/min (ref >60)
Glucose, Bld: 142 mg/dL — ABNORMAL HIGH (ref 65–99)
Potassium: 2.9 mmol/L — ABNORMAL LOW (ref 3.5–5.1)
SODIUM: 141 mmol/L (ref 135–145)

## 2017-02-07 LAB — CBC
HCT: 42.2 % (ref 36.0–46.0)
HEMOGLOBIN: 13.6 g/dL (ref 12.0–15.0)
MCH: 28.2 pg (ref 26.0–34.0)
MCHC: 32.2 g/dL (ref 30.0–36.0)
MCV: 87.6 fL (ref 78.0–100.0)
PLATELETS: 242 10*3/uL (ref 150–400)
RBC: 4.82 MIL/uL (ref 3.87–5.11)
RDW: 14.5 % (ref 11.5–15.5)
WBC: 17 10*3/uL — AB (ref 4.0–10.5)

## 2017-02-07 MED ORDER — ACETAMINOPHEN 325 MG PO TABS
650.0000 mg | ORAL_TABLET | Freq: Once | ORAL | Status: AC
  Administered 2017-02-07: 650 mg via ORAL
  Filled 2017-02-07: qty 2

## 2017-02-07 MED ORDER — IBUPROFEN 800 MG PO TABS
800.0000 mg | ORAL_TABLET | Freq: Once | ORAL | Status: AC
  Administered 2017-02-07: 800 mg via ORAL
  Filled 2017-02-07: qty 1

## 2017-02-07 MED ORDER — CEPHALEXIN 500 MG PO CAPS: 500.0000 mg | ORAL_CAPSULE | Freq: Four times a day (QID) | ORAL | 0 refills | Status: DC

## 2017-02-07 MED ORDER — ONDANSETRON HCL 4 MG PO TABS
4.0000 mg | ORAL_TABLET | Freq: Once | ORAL | Status: AC
  Administered 2017-02-07: 4 mg via ORAL
  Filled 2017-02-07: qty 1

## 2017-02-07 MED ORDER — TRAMADOL HCL 50 MG PO TABS: 50.0000 mg | ORAL_TABLET | Freq: Four times a day (QID) | ORAL | 0 refills | Status: DC | PRN

## 2017-02-07 MED ORDER — POTASSIUM CHLORIDE CRYS ER 20 MEQ PO TBCR
40.0000 meq | EXTENDED_RELEASE_TABLET | Freq: Once | ORAL | Status: AC
  Administered 2017-02-07: 40 meq via ORAL
  Filled 2017-02-07: qty 2

## 2017-02-07 MED ORDER — CEFTRIAXONE SODIUM 1 G IJ SOLR
1.0000 g | Freq: Once | INTRAMUSCULAR | Status: AC
  Administered 2017-02-07: 1 g via INTRAMUSCULAR
  Filled 2017-02-07: qty 10

## 2017-02-07 NOTE — ED Triage Notes (Signed)
Pt c/o burning during urination that started yesterday.

## 2017-02-07 NOTE — Discharge Instructions (Signed)
Your examination and lab tests questionable urinary tract infection. There is also noted a low potassium. I have included information on the amount of potassium and various foods. Please increase your potassium in your diet. Please increase fluids. Use Keflex with breakfast, lunch, dinner, and bedtime until all taken. A culture of your urine has been sent to the lab. Please see Dr. Legrand Rams for recheck of your potassium, and recheck of your urine in about 7 days. Please return to the emergency department if any emergent changes, problems, or concerns.

## 2017-02-07 NOTE — ED Provider Notes (Signed)
[facility] DEPT Provider Note   CSN: [id number] Arrival date & time: 02/07/17  2043     History   Chief Complaint Chief Complaint  Patient presents with  . Dysuria    HPI TRAVONNA KOSOWSKI is a 61 y.o. female.  The history is provided by the patient.  Dysuria   This is a new problem. The current episode started yesterday. The problem occurs every urination. The problem has been gradually worsening. The quality of the pain is described as burning. The pain is moderate. There has been no fever. Associated symptoms include chills, frequency and urgency. Pertinent negatives include no vomiting and no hematuria. She has tried nothing for the symptoms. Her past medical history does not include single kidney or catheterization.    Past Medical History:  Diagnosis Date  . Chronic back pain   . COPD (chronic obstructive pulmonary disease) ([place])   . Headache   . Hyperlipidemia   . Hypertension   . Normal cardiac stress test 12/2014    Patient Active Problem List   Diagnosis Date Noted  . SBO (small bowel obstruction) 08/16/2016  . Hyperlipidemia 08/16/2016  . Chest pain 12/12/2014  . Hypertension 12/12/2014  . Tobacco abuse 10/29/2011    Past Surgical History:  Procedure Laterality Date  . ABDOMINAL HYSTERECTOMY    . CHOLECYSTECTOMY      OB History    Gravida Para Term Preterm AB Living             3   SAB TAB Ectopic Multiple Live Births                   Home Medications    Prior to Admission medications   Medication Sig Start Date End Date Taking? Authorizing Provider  albuterol (PROVENTIL HFA;VENTOLIN HFA) 108 (90 Base) MCG/ACT inhaler Inhale 1-2 puffs into the lungs every 6 (six) hours as needed for wheezing or shortness of breath. 09/06/16  Yes Tammy Triplett, PA-C  albuterol (PROVENTIL) (2.5 MG/3ML) 0.083% nebulizer solution Take 2.5 mg by nebulization every 6 (six) hours as needed for wheezing or shortness of breath.   Yes Historical Provider, MD    amLODipine (NORVASC) 5 MG tablet Take 5 mg by mouth daily. 09/11/16  Yes Historical Provider, MD  atorvastatin (LIPITOR) 20 MG tablet Take 1 tablet (20 mg total) by mouth daily. 12/23/14  Yes Fay Records, MD    Family History Family History  Problem Relation Age of Onset  . Hypertension Mother     Social History Social History  Substance Use Topics  . Smoking status: Current Every Day Smoker    Packs/day: 0.50    Years: 40.00    Types: Cigarettes  . Smokeless tobacco: Never Used  . Alcohol use No     Allergies   Patient has no known allergies.   Review of Systems Review of Systems  Constitutional: Positive for chills.  Gastrointestinal: Negative for vomiting.  Genitourinary: Positive for dysuria, frequency and urgency. Negative for hematuria.  Musculoskeletal: Positive for back pain.  All other systems reviewed and are negative.    Physical Exam Updated Vital Signs BP 130/74   Pulse 85   Temp 99.3 F (37.4 C)   Resp 17   Ht 5' (1.524 m)   Wt 59 kg   SpO2 99%   BMI 25.39 kg/m   Physical Exam  Constitutional: She is oriented to person, place, and time. She appears well-developed and well-nourished.  Non-toxic appearance.  HENT:  Head: Normocephalic.  Right Ear: Tympanic membrane and external ear normal.  Left Ear: Tympanic membrane and external ear normal.  Eyes: EOM and lids are normal. Pupils are equal, round, and reactive to light.  Neck: Normal range of motion. Neck supple. Carotid bruit is not present.  Cardiovascular: Normal rate, regular rhythm, normal heart sounds, intact distal pulses and normal pulses.   Pulmonary/Chest: Breath sounds normal. No respiratory distress.  Abdominal: Soft. Bowel sounds are normal. There is tenderness in the suprapubic area. There is no guarding and no CVA tenderness.  Musculoskeletal: Normal range of motion.       Lumbar back: She exhibits pain.       Back:  Lymphadenopathy:       Head (right side): No submandibular  adenopathy present.       Head (left side): No submandibular adenopathy present.    She has no cervical adenopathy.  Neurological: She is alert and oriented to person, place, and time. She has normal strength. No cranial nerve deficit or sensory deficit.  Skin: Skin is warm and dry.  Psychiatric: She has a normal mood and affect. Her speech is normal.  Nursing note and vitals reviewed.    ED Treatments / Results  Labs (all labs ordered are listed, but only abnormal results are displayed) Labs Reviewed  URINALYSIS, ROUTINE W REFLEX MICROSCOPIC - Abnormal; Notable for the following:       Result Value   Color, Urine AMBER (*)    APPearance HAZY (*)    Ketones, ur 5 (*)    Leukocytes, UA TRACE (*)    All other components within normal limits  CBC - Abnormal; Notable for the following:    WBC 17.0 (*)    All other components within normal limits  BASIC METABOLIC PANEL - Abnormal; Notable for the following:    Potassium 2.9 (*)    Glucose, Bld 142 (*)    All other components within normal limits    EKG  EKG Interpretation None       Radiology No results found.  Procedures Procedures (including critical care time)  Medications Ordered in ED Medications - No data to display   Initial Impression / Assessment and Plan / ED Course  I have reviewed the triage vital signs and the nursing notes.  Pertinent labs & imaging results that were available during my care of the patient were reviewed by me and considered in my medical decision making (see chart for details).     *I have reviewed nursing notes, vital signs, and all appropriate lab and imaging results for this patient.**  Final Clinical Impressions(s) / ED Diagnoses MDM Temperature is 99.3, otherwise the vital signs are within normal limits. No high fever reported. Some back pain noted. No vomiting reported. Will check urine for kidney stone and evidence of pyelonephritis.  Complete blood count shows a white blood  cells to be elevated at 17,000. There is no shift to the left reported. The basic metabolic panel shows potassium to be low at 2.9, the anion gap is normal at 7. The urinalysis shows Amber hazy specimen with a specific gravity of 1.028. Nitrates are negative, but leukocyte esterase is trace.  Urine sent to the lab for culture. Patient will be treated with oral potassium. Will give the patient a list of high potassium foods to use in her diet, and will ask the patient to have her potassium rechecked in for 5 days.  Temperature is 99.3, white blood cell count was 17,000. The patient  is noted to have leukocyte esterase present on the urine exam. Patient will be treated with 1 g of Rocephin here in the emergency department. Prescription for Keflex be given to the patient to use. The patient is to follow-up with Dr. Legrand Rams to have the urine rechecked next week. Patient is in agreement with this discharge plan.    Final diagnoses:  None    New Prescriptions New Prescriptions   No medications on file     Lily Kocher, PA-C 02/07/17 [place], MD 02/08/17 [phone]

## 2017-02-10 LAB — URINE CULTURE: CULTURE: NO GROWTH

## 2017-02-20 DIAGNOSIS — N390 Urinary tract infection, site not specified: Secondary | ICD-10-CM | POA: Diagnosis not present

## 2017-02-20 DIAGNOSIS — R51 Headache: Secondary | ICD-10-CM | POA: Diagnosis not present

## 2017-02-21 ENCOUNTER — Emergency Department (HOSPITAL_COMMUNITY): Payer: Medicare Other

## 2017-02-21 ENCOUNTER — Encounter (HOSPITAL_COMMUNITY): Payer: Self-pay | Admitting: *Deleted

## 2017-02-21 ENCOUNTER — Emergency Department (HOSPITAL_COMMUNITY)
Admission: EM | Admit: 2017-02-21 | Discharge: 2017-02-22 | Disposition: A | Discharge status: Home / Self Care | Payer: Medicare Other | Attending: Emergency Medicine | Admitting: Emergency Medicine

## 2017-02-21 DIAGNOSIS — F17210 Nicotine dependence, cigarettes, uncomplicated: Secondary | ICD-10-CM | POA: Diagnosis not present

## 2017-02-21 DIAGNOSIS — R1013 Epigastric pain: Secondary | ICD-10-CM | POA: Diagnosis present

## 2017-02-21 DIAGNOSIS — R1010 Upper abdominal pain, unspecified: Secondary | ICD-10-CM | POA: Diagnosis not present

## 2017-02-21 DIAGNOSIS — R1900 Intra-abdominal and pelvic swelling, mass and lump, unspecified site: Secondary | ICD-10-CM | POA: Diagnosis not present

## 2017-02-21 DIAGNOSIS — J449 Chronic obstructive pulmonary disease, unspecified: Secondary | ICD-10-CM | POA: Diagnosis not present

## 2017-02-21 DIAGNOSIS — I10 Essential (primary) hypertension: Secondary | ICD-10-CM | POA: Diagnosis not present

## 2017-02-21 DIAGNOSIS — K5909 Other constipation: Secondary | ICD-10-CM

## 2017-02-21 LAB — COMPREHENSIVE METABOLIC PANEL
ALT: 14 U/L (ref 14–54)
AST: 17 U/L (ref 15–41)
Albumin: 4 g/dL (ref 3.5–5.0)
Alkaline Phosphatase: 87 U/L (ref 38–126)
Anion gap: 7 (ref 5–15)
BUN: 13 mg/dL (ref 6–20)
CALCIUM: 9.1 mg/dL (ref 8.9–10.3)
CHLORIDE: 107 mmol/L (ref 101–111)
CO2: 26 mmol/L (ref 22–32)
CREATININE: 0.99 mg/dL (ref 0.44–1.00)
GFR calc non Af Amer: >60 mL/min (ref >60)
Glucose, Bld: 99 mg/dL (ref 65–99)
Potassium: 3.7 mmol/L (ref 3.5–5.1)
Sodium: 140 mmol/L (ref 135–145)
Total Bilirubin: 0.5 mg/dL (ref 0.3–1.2)
Total Protein: 7.1 g/dL (ref 6.5–8.1)

## 2017-02-21 LAB — CBC
HCT: 45 % (ref 36.0–46.0)
Hemoglobin: 14.7 g/dL (ref 12.0–15.0)
MCH: 28.2 pg (ref 26.0–34.0)
MCHC: 32.7 g/dL (ref 30.0–36.0)
MCV: 86.4 fL (ref 78.0–100.0)
Platelets: 256 10*3/uL (ref 150–400)
RBC: 5.21 MIL/uL — ABNORMAL HIGH (ref 3.87–5.11)
RDW: 14.2 % (ref 11.5–15.5)
WBC: 11.6 10*3/uL — ABNORMAL HIGH (ref 4.0–10.5)

## 2017-02-21 LAB — LIPASE, BLOOD: LIPASE: 23 U/L (ref 11–51)

## 2017-02-21 LAB — TROPONIN I: Troponin I: <0.03 ng/mL

## 2017-02-21 MED ORDER — ONDANSETRON HCL 4 MG/2ML IJ SOLN
4.0000 mg | Freq: Once | INTRAMUSCULAR | Status: AC
  Administered 2017-02-21: 4 mg via INTRAVENOUS
  Filled 2017-02-21: qty 2

## 2017-02-21 MED ORDER — MORPHINE SULFATE (PF) 4 MG/ML IV SOLN
4.0000 mg | Freq: Once | INTRAVENOUS | Status: AC
  Administered 2017-02-21: 4 mg via INTRAVENOUS
  Filled 2017-02-21: qty 1

## 2017-02-21 MED ORDER — IOPAMIDOL (ISOVUE-300) INJECTION 61%
INTRAVENOUS | Status: AC
  Administered 2017-02-22: 30 mL
  Filled 2017-02-21: qty 30

## 2017-02-21 NOTE — ED Provider Notes (Signed)
[facility] DEPT Provider Note   CSN: [id number] Arrival date & time: 02/21/17  1848  By signing my name below, I, Gwenlyn Fudge, attest that this documentation has been prepared under the direction and in the presence of Ezequiel Essex, MD. Electronically Signed: Gwenlyn Fudge, ED Scribe. 02/21/17. 11:26 PM.   History   Chief Complaint Chief Complaint  Patient presents with  . Abdominal Pain    The history is provided by the patient. No language interpreter was used.    HPI Comments: TEFFANY BLASZCZYK is a 61 y.o. female with pMHc of COPD, HLD and HTN who presents to the Emergency Department complaining of gradual onset, episodic, moderate epigastric abdominal pain beginning today. Episodes last about 10 minutes. Hx of similar pain before with bowel obstruction in 07/2016. Pain radiates through to lower back. She reports abdominal distension and nausea. PSHx includes cholecystectomy and hysterectomy. No recent surgeries. No PMHx of DM, MI or CAD. Last normal bowel movement this morning. Pt has x3 bowel movements daily. Pt is still having flatulence. Denies vomiting, diarrhea, chest pain, vaginal bleeding or discharge, shortness of breath or any other complaints at this time. NKDA.   Past Medical History:  Diagnosis Date  . Chronic back pain   . COPD (chronic obstructive pulmonary disease) ([place])   . Headache   . Hyperlipidemia   . Hypertension   . Normal cardiac stress test 12/2014    Patient Active Problem List   Diagnosis Date Noted  . SBO (small bowel obstruction) 08/16/2016  . Hyperlipidemia 08/16/2016  . Chest pain 12/12/2014  . Hypertension 12/12/2014  . Tobacco abuse 10/29/2011    Past Surgical History:  Procedure Laterality Date  . ABDOMINAL HYSTERECTOMY    . CHOLECYSTECTOMY      OB History    Gravida Para Term Preterm AB Living             3   SAB TAB Ectopic Multiple Live Births                   Home Medications    Prior to Admission medications     Medication Sig Start Date End Date Taking? Authorizing Provider  albuterol (PROVENTIL HFA;VENTOLIN HFA) 108 (90 Base) MCG/ACT inhaler Inhale 1-2 puffs into the lungs every 6 (six) hours as needed for wheezing or shortness of breath. 09/06/16   Tammy Triplett, PA-C  albuterol (PROVENTIL) (2.5 MG/3ML) 0.083% nebulizer solution Take 2.5 mg by nebulization every 6 (six) hours as needed for wheezing or shortness of breath.    Historical Provider, MD  amLODipine (NORVASC) 5 MG tablet Take 5 mg by mouth daily. 09/11/16   Historical Provider, MD  atorvastatin (LIPITOR) 20 MG tablet Take 1 tablet (20 mg total) by mouth daily. 12/23/14   Fay Records, MD  cephALEXin (KEFLEX) 500 MG capsule Take 1 capsule (500 mg total) by mouth 4 (four) times daily. 02/07/17   Lily Kocher, PA-C  traMADol (ULTRAM) 50 MG tablet Take 1 tablet (50 mg total) by mouth every 6 (six) hours as needed. 02/07/17   Lily Kocher, PA-C    Family History Family History  Problem Relation Age of Onset  . Hypertension Mother     Social History Social History  Substance Use Topics  . Smoking status: Current Every Day Smoker    Packs/day: 0.50    Years: 40.00    Types: Cigarettes  . Smokeless tobacco: Never Used  . Alcohol use No     Allergies   Patient  has no known allergies.   Review of Systems Review of Systems A complete 10 system review of systems was obtained and all systems are negative except as noted in the HPI and PMH.    Physical Exam Updated Vital Signs BP 115/72 (BP Location: Right Arm)   Pulse 83   Temp 98.9 F (37.2 C) (Oral)   Resp 18   Ht 5' (1.524 m)   Wt 130 lb (59 kg)   SpO2 100%   BMI 25.39 kg/m   Physical Exam  Constitutional: She is oriented to person, place, and time. She appears well-developed and well-nourished. No distress.  HENT:  Head: Normocephalic and atraumatic.  Mouth/Throat: Oropharynx is clear and moist. No oropharyngeal exudate.  Eyes: Conjunctivae and EOM are normal. Pupils  are equal, round, and reactive to light.  Neck: Normal range of motion. Neck supple.  No meningismus.  Cardiovascular: Normal rate, regular rhythm, normal heart sounds and intact distal pulses.   No murmur heard. R DP and PT pulses present L DP and PT pulses not felt but present with Doppler  Pulmonary/Chest: Effort normal and breath sounds normal. No respiratory distress.  Abdominal: Soft. She exhibits distension. There is tenderness. There is guarding (involuntary). There is no rebound.  No CVA tenderness Epigastric tenderness  Musculoskeletal: Normal range of motion. She exhibits no edema or tenderness.  Neurological: She is alert and oriented to person, place, and time. No cranial nerve deficit. She exhibits normal muscle tone. Coordination normal.  No ataxia on finger to nose bilaterally. No pronator drift. 5/5 strength throughout. CN 2-12 intact.Equal grip strength. Sensation intact.   Skin: Skin is warm.  Psychiatric: She has a normal mood and affect. Her behavior is normal.  Nursing note and vitals reviewed.  ED Treatments / Results  DIAGNOSTIC STUDIES: Oxygen Saturation is 100% on RA, normal by my interpretation.    COORDINATION OF CARE: 11:10 PM Discussed treatment plan with pt at bedside which includes DG Abdomen and lab work and pt agreed to plan.  Labs (all labs ordered are listed, but only abnormal results are displayed) Labs Reviewed  CBC - Abnormal; Notable for the following:       Result Value   WBC 11.6 (*)    RBC 5.21 (*)    All other components within normal limits  LIPASE, BLOOD  COMPREHENSIVE METABOLIC PANEL  TROPONIN I    EKG  EKG Interpretation  Date/Time:  Thursday February 22 2017 00:32:54 EST Ventricular Rate:  69 PR Interval:    QRS Duration: 89 QT Interval:  406 QTC Calculation: 435 R Axis:   102 Text Interpretation:  Right and left arm electrode reversal, interpretation assumes no reversal Sinus rhythm Probable lateral infarct, old Baseline  wander in lead(s) V5 No significant change was found Confirmed by Wyvonnia Dusky  MD, Tajia Szeliga [phone]) on 02/22/2017 12:39:55 AM       Radiology Ct Abdomen Pelvis W Contrast  Result Date: 02/22/2017 CLINICAL DATA:  Gradually worsening epigastric abdominal pain, onset today. EXAM: CT ABDOMEN AND PELVIS WITH CONTRAST TECHNIQUE: Multidetector CT imaging of the abdomen and pelvis was performed using the standard protocol following bolus administration of intravenous contrast. CONTRAST:  79mL ISOVUE-300 IOPAMIDOL (ISOVUE-300) INJECTION 61%, 14mL ISOVUE-300 IOPAMIDOL (ISOVUE-300) INJECTION 61% COMPARISON:  None. FINDINGS: Lower chest: No acute abnormality. Hepatobiliary: No focal liver abnormality is seen. Status post cholecystectomy. No biliary dilatation. Pancreas: Unremarkable. No pancreatic ductal dilatation or surrounding inflammatory changes. Spleen: Normal in size without focal abnormality. Adrenals/Urinary Tract: Adrenal glands are  unremarkable. Kidneys are normal, without renal calculi, focal lesion, or hydronephrosis. Bladder is unremarkable. Stomach/Bowel: Stomach is within normal limits. Appendix appears normal. No evidence of bowel wall thickening, distention, or inflammatory changes. Vascular/Lymphatic: Extensive atherosclerotic calcification of the normal caliber abdominal aorta. Heavily calcified common iliac arteries, completely occluded on the left with reconstitution of patency at the iliac bifurcation. Celiac, SMA and IMA are patent. Reproductive: Hysterectomy.  No adnexal abnormalities. Other: No focal inflammation.  No ascites. Musculoskeletal: No significant skeletal lesions. IMPRESSION: 1. No acute inflammatory changes in the abdomen or pelvis. No bowel obstruction or perforation. 2. 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 Extensive aortic atherosclerosis. 3. Prior hysterectomy and cholecystectomy. Electronically Signed   By: Andreas Newport M.D.   On:  02/22/2017 00:42   Dg Abdomen Acute W/chest  Result Date: 02/22/2017 CLINICAL DATA:  Abdominal pain and swelling, onset today. EXAM: DG ABDOMEN ACUTE W/ 1V CHEST COMPARISON:  10/26/2016 CT FINDINGS: Large volume colonic stool and air. No evidence of bowel obstruction or perforation. Right upper quadrant cholecystectomy clips. No biliary or urinary calculi are evident. The upright view of the chest is negative for significant abnormality. IMPRESSION: Generous volume colonic stool and air without evidence of bowel obstruction or perforation. Electronically Signed   By: Andreas Newport M.D.   On: 02/22/2017 00:31    Procedures Procedures (including critical care time)  Medications Ordered in ED Medications  morphine 4 MG/ML injection 4 mg (4 mg Intravenous Given 02/21/17 2339)  ondansetron (ZOFRAN) injection 4 mg (4 mg Intravenous Given 02/21/17 2339)  iopamidol (ISOVUE-300) 61 % injection (30 mLs  Contrast Given 02/22/17 0003)  iopamidol (ISOVUE-300) 61 % injection 100 mL (100 mLs Intravenous Contrast Given 02/22/17 0003)     Initial Impression / Assessment and Plan / ED Course  I have reviewed the triage vital signs and the nursing notes.  Pertinent labs & imaging results that were available during my care of the patient were reviewed by me and considered in my medical decision making (see chart for details).     Upper abdominal pain with nausea, similar to previous bowel obstructions. Denies vomiting. No chest pain.   Labs show mild leukocytosis. Patient unable to give urine sample.   Abdominal imaging shows constipation. No bowel obstruction. Patient refuses rectal exam to assess for fecal impaction. States her last bowel movement was this morning does not know when her bowel movement was before this.  Incidental finding of completely occluded left common iliac artery. Patient does have pulses in her feet but they're weaker compared to the other side. Denies any claudication symptoms. D/w  Dr. Oneida Alar of vascular surgery.  He agrees this is likely a chronic finding.  Recommends starting ASA and outpatient followup.   Treat for constipation with magnesium citrate and miralax and colace. Follow up with PCP. Return precautions discussed.   Final Clinical Impressions(s) / ED Diagnoses   Final diagnoses:  Pain of upper abdomen  Other constipation    New Prescriptions New Prescriptions   No medications on file    I personally performed the services described in this documentation, which was scribed in my presence. The recorded information has been reviewed and is accurate.    Ezequiel Essex, MD 02/22/17 [phone]

## 2017-02-21 NOTE — ED Triage Notes (Signed)
Pt c/o abdominal pain and swelling that started today

## 2017-02-22 ENCOUNTER — Other Ambulatory Visit: Payer: Self-pay | Admitting: *Deleted

## 2017-02-22 ENCOUNTER — Encounter: Payer: Self-pay | Admitting: *Deleted

## 2017-02-22 DIAGNOSIS — I739 Peripheral vascular disease, unspecified: Secondary | ICD-10-CM

## 2017-02-22 DIAGNOSIS — R1900 Intra-abdominal and pelvic swelling, mass and lump, unspecified site: Secondary | ICD-10-CM | POA: Diagnosis not present

## 2017-02-22 DIAGNOSIS — R1013 Epigastric pain: Secondary | ICD-10-CM | POA: Diagnosis not present

## 2017-02-22 DIAGNOSIS — K5909 Other constipation: Secondary | ICD-10-CM | POA: Diagnosis not present

## 2017-02-22 MED ORDER — POLYETHYLENE GLYCOL 3350 17 G PO PACK: 17.0000 g | PACK | Freq: Every day | ORAL | 0 refills | Status: DC

## 2017-02-22 MED ORDER — IOPAMIDOL (ISOVUE-300) INJECTION 61%
100.0000 mL | Freq: Once | INTRAVENOUS | Status: AC | PRN
  Administered 2017-02-22: 100 mL via INTRAVENOUS

## 2017-02-22 MED ORDER — ASPIRIN 81 MG PO CHEW: 81.0000 mg | CHEWABLE_TABLET | Freq: Every day | ORAL | 0 refills | Status: DC

## 2017-02-22 MED ORDER — DOCUSATE SODIUM 100 MG PO CAPS: 100.0000 mg | ORAL_CAPSULE | Freq: Two times a day (BID) | ORAL | 0 refills | Status: DC

## 2017-02-22 MED ORDER — MAGNESIUM CITRATE PO SOLN
1.0000 | Freq: Once | ORAL | Status: AC
  Administered 2017-02-22: 1 via ORAL
  Filled 2017-02-22: qty 296

## 2017-02-22 NOTE — Discharge Instructions (Signed)
Take the medications for constipation as prescribed. Follow up with your doctor. Return to the ED if you develop new or worsening symptoms. Take an aspirin a day and follow up with the vascular doctor regarding the blocked artery in your leg.

## 2017-02-22 NOTE — Progress Notes (Signed)
Pt needs eval for PAD. Needs ABIs if she has not had them    [name]  ----- Message -----  From: Dewaine Oats, RN  Sent: 02/22/2017  2:02 AM  To: Elam Dutch, MD          ED Referral Summary with [facility]  MRN: [id number]  Department: [facility] Date of Visit: 02/21/2017  Virginia Washington was seen in the ED and was advised to follow up with you as necessary.  It is the sole responsibility of the patient to arrange for an appointment with you or your practice.  This notification in no way is meant to establish a doctor-patient relationship.  ED Referral Summary      This message was sent to our Appt desk for scheduling. I put the ABI order in EPIC.

## 2017-02-22 NOTE — ED Notes (Signed)
Pt ambulatory to waiting room. Pt verbalized understanding of discharge instructions.   

## 2017-04-11 ENCOUNTER — Encounter: Payer: Self-pay | Admitting: Vascular Surgery

## 2017-04-19 ENCOUNTER — Encounter: Payer: Self-pay | Admitting: Vascular Surgery

## 2017-04-19 ENCOUNTER — Ambulatory Visit (INDEPENDENT_AMBULATORY_CARE_PROVIDER_SITE_OTHER): Payer: Medicare Other | Admitting: Vascular Surgery

## 2017-04-19 ENCOUNTER — Ambulatory Visit (HOSPITAL_COMMUNITY)
Admission: RE | Admit: 2017-04-19 | Discharge: 2017-04-19 | Disposition: A | Discharge status: Home / Self Care | Payer: Medicare Other | Source: Ambulatory Visit | Attending: Vascular Surgery | Admitting: Vascular Surgery

## 2017-04-19 VITALS — BP 119/74 | HR 64 | Temp 97.1°F | Resp 16 | Ht 61.0 in | Wt 131.8 lb

## 2017-04-19 DIAGNOSIS — I739 Peripheral vascular disease, unspecified: Secondary | ICD-10-CM

## 2017-04-19 NOTE — Progress Notes (Signed)
Referring Physician: Ezequiel Essex MD  Patient name: Virginia Washington MRN: [id number] DOB: [birth year] Sex: female  REASON FOR CONSULT: Left leg claudication  HPI: Virginia Washington is a 61 y.o. female with a 2 year history of one block claudication in her left leg. She currently smokes half pack cigarettes per day. Greater than 3 minutes today spent regarding smoking cessation counseling. She was recently discovered on a CT scan of abdomen and pelvis for abdominal pain to have a chronic left common iliac artery occlusion. She denies rest pain. She denies nonhealing wounds. Other medical problems include COPD chronic back pain hyperlipidemia hypertension. These are all currently stable. She is on Lipitor.  Past Medical History:  Diagnosis Date  . Chronic back pain   . COPD (chronic obstructive pulmonary disease) ([place])   . Headache   . Hyperlipidemia   . Hypertension   . Normal cardiac stress test 12/2014   Past Surgical History:  Procedure Laterality Date  . ABDOMINAL HYSTERECTOMY    . CHOLECYSTECTOMY      Family History  Problem Relation Age of Onset  . Hypertension Mother     SOCIAL HISTORY: Social History   Social History  . Marital status: Widowed    Spouse name: N/A  . Number of children: N/A  . Years of education: N/A   Occupational History  . unemployed    Social History Main Topics  . Smoking status: Current Every Day Smoker    Packs/day: 0.50    Years: 40.00    Types: Cigarettes  . Smokeless tobacco: Never Used  . Alcohol use No  . Drug use: No  . Sexual activity: No   Other Topics Concern  . Not on file   Social History Narrative  . No narrative on file    No Known Allergies  Current Outpatient Prescriptions  Medication Sig Dispense Refill  . albuterol (PROVENTIL HFA;VENTOLIN HFA) 108 (90 Base) MCG/ACT inhaler Inhale 1-2 puffs into the lungs every 6 (six) hours as needed for wheezing or shortness of breath. 1 Inhaler 0  . albuterol  (PROVENTIL) (2.5 MG/3ML) 0.083% nebulizer solution Take 2.5 mg by nebulization every 6 (six) hours as needed for wheezing or shortness of breath.    Marland Kitchen amLODipine (NORVASC) 5 MG tablet Take 5 mg by mouth daily.    Marland Kitchen aspirin 81 MG chewable tablet Chew 1 tablet (81 mg total) by mouth daily. 30 tablet 0  . atorvastatin (LIPITOR) 20 MG tablet Take 1 tablet (20 mg total) by mouth daily. 90 tablet 3  . cephALEXin (KEFLEX) 500 MG capsule Take 1 capsule (500 mg total) by mouth 4 (four) times daily. 20 capsule 0  . docusate sodium (COLACE) 100 MG capsule Take 1 capsule (100 mg total) by mouth every 12 (twelve) hours. 60 capsule 0  . polyethylene glycol (MIRALAX) packet Take 17 g by mouth daily. 14 each 0  . traMADol (ULTRAM) 50 MG tablet Take 1 tablet (50 mg total) by mouth every 6 (six) hours as needed. 12 tablet 0   No current facility-administered medications for this visit.     ROS:   General:  No weight loss, Fever, chills  HEENT: No recent headaches, no nasal bleeding, no visual changes, no sore throat  Neurologic: No dizziness, blackouts, seizures. No recent symptoms of stroke or mini- stroke. No recent episodes of slurred speech, or temporary blindness.  Cardiac: No recent episodes of chest pain/pressure, no shortness of breath at rest.  No shortness of breath  with exertion.  Denies history of atrial fibrillation or irregular heartbeat  Vascular: No history of rest pain in feet.  No history of claudication.  No history of non-healing ulcer, No history of DVT   Pulmonary: No home oxygen, no productive cough, no hemoptysis,  No asthma or wheezing  Musculoskeletal:  [ ]  Arthritis, [X]  Low back pain,  [X]  Joint pain  Hematologic:No history of hypercoagulable state.  No history of easy bleeding.  No history of anemia  Gastrointestinal: No hematochezia or melena,  No gastroesophageal reflux, no trouble swallowing  Urinary: [ ]  chronic Kidney disease, [ ]  on HD - [ ]  MWF or [ ]  TTHS, [ ]   Burning with urination, [ ]  Frequent urination, [ ]  Difficulty urinating;   Skin: No rashes  Psychological: No history of anxiety,  No history of depression   Physical Examination  Vitals:   04/19/17 1104  BP: 119/74  Pulse: 64  Resp: 16  Temp: 97.1 F (36.2 C)  TempSrc: Oral  SpO2: 100%  Weight: 131 lb 12.8 oz (59.8 kg)  Height: 5\' 1"  (1.549 m)    Body mass index is 24.9 kg/m.  General:  Alert and oriented, no acute distress HEENT: Normal Neck: No bruit or JVD Pulmonary: Clear to auscultation bilaterally Cardiac: Regular Rate and Rhythm without murmur Abdomen: Soft, non-tender, non-distended, no mass, no scars Skin: No rash Extremity Pulses:  2+ radial, brachial, absent left 2+ right femoral, 2+ right dorsalis pedis absent right dorsalis pedis, absent posterior tibial pulses bilaterally Musculoskeletal: No deformity or edema  Neurologic: Upper and lower extremity motor 5/5 and symmetric  DATA:  Patient had bilateral ABIs performed today which I reviewed and interpreted. They were 0.73 on the left 1.18 on the right  I reviewed the recent CT scan of the abdomen and pelvis from [facility] which shows left common iliac artery occlusion.  ASSESSMENT:  Peripheral arterial disease with 1 block claudication left lower extremity. I discussed the patient today the possibility of arteriogram intervention with angioplasty stenting or possible operation. She currently does not feel that she is debilitated enough by her current symptoms. She currently is not at risk of limb loss with an ABI of 0.73. However, she will probably continue to have experience of claudication with the iliac obstruction. I did discuss with her walking program of 30 minutes daily and smoking cessation. She has currently opted for this rather than intervention.   PLAN:  Patient will follow-up in 6 months with repeat ABIs. Return sooner she wishes to have intervention. She will see our nurse practitioner at her  return office visit.   [name], MD [facility] Office: [phone] Pager: [phone]

## 2017-04-23 NOTE — Addendum Note (Signed)
Addended by: Lianne Cure A on: 04/23/2017 04:35 PM   Modules accepted: Orders

## 2017-05-27 ENCOUNTER — Emergency Department (HOSPITAL_COMMUNITY): Payer: Medicare Other

## 2017-05-27 ENCOUNTER — Encounter (HOSPITAL_COMMUNITY): Payer: Self-pay | Admitting: *Deleted

## 2017-05-27 ENCOUNTER — Emergency Department (HOSPITAL_COMMUNITY)
Admission: EM | Admit: 2017-05-27 | Discharge: 2017-05-27 | Disposition: A | Discharge status: Home / Self Care | Payer: Medicare Other | Attending: Emergency Medicine | Admitting: Emergency Medicine

## 2017-05-27 DIAGNOSIS — R1110 Vomiting, unspecified: Secondary | ICD-10-CM | POA: Diagnosis not present

## 2017-05-27 DIAGNOSIS — R112 Nausea with vomiting, unspecified: Secondary | ICD-10-CM

## 2017-05-27 DIAGNOSIS — J449 Chronic obstructive pulmonary disease, unspecified: Secondary | ICD-10-CM | POA: Insufficient documentation

## 2017-05-27 DIAGNOSIS — R1030 Lower abdominal pain, unspecified: Secondary | ICD-10-CM | POA: Insufficient documentation

## 2017-05-27 DIAGNOSIS — F17210 Nicotine dependence, cigarettes, uncomplicated: Secondary | ICD-10-CM | POA: Insufficient documentation

## 2017-05-27 DIAGNOSIS — R748 Abnormal levels of other serum enzymes: Secondary | ICD-10-CM | POA: Diagnosis not present

## 2017-05-27 DIAGNOSIS — R109 Unspecified abdominal pain: Secondary | ICD-10-CM | POA: Diagnosis not present

## 2017-05-27 DIAGNOSIS — Z79899 Other long term (current) drug therapy: Secondary | ICD-10-CM | POA: Insufficient documentation

## 2017-05-27 DIAGNOSIS — I10 Essential (primary) hypertension: Secondary | ICD-10-CM | POA: Insufficient documentation

## 2017-05-27 DIAGNOSIS — Z7982 Long term (current) use of aspirin: Secondary | ICD-10-CM | POA: Diagnosis not present

## 2017-05-27 LAB — COMPREHENSIVE METABOLIC PANEL
ALT: 13 U/L — AB (ref 14–54)
ANION GAP: 10 (ref 5–15)
AST: 17 U/L (ref 15–41)
Albumin: 3.9 g/dL (ref 3.5–5.0)
Alkaline Phosphatase: 91 U/L (ref 38–126)
BUN: 11 mg/dL (ref 6–20)
CALCIUM: 9.3 mg/dL (ref 8.9–10.3)
CHLORIDE: 109 mmol/L (ref 101–111)
CO2: 25 mmol/L (ref 22–32)
CREATININE: 0.93 mg/dL (ref 0.44–1.00)
Glucose, Bld: 115 mg/dL — ABNORMAL HIGH (ref 65–99)
Potassium: 3.4 mmol/L — ABNORMAL LOW (ref 3.5–5.1)
Sodium: 144 mmol/L (ref 135–145)
Total Bilirubin: 0.3 mg/dL (ref 0.3–1.2)
Total Protein: 6.8 g/dL (ref 6.5–8.1)

## 2017-05-27 LAB — URINALYSIS, ROUTINE W REFLEX MICROSCOPIC
Bilirubin Urine: NEGATIVE
Glucose, UA: NEGATIVE mg/dL
Hgb urine dipstick: NEGATIVE
Ketones, ur: NEGATIVE mg/dL
LEUKOCYTES UA: NEGATIVE
Nitrite: NEGATIVE
PROTEIN: NEGATIVE mg/dL
Specific Gravity, Urine: 1.024 (ref 1.005–1.030)
pH: 7 (ref 5.0–8.0)

## 2017-05-27 LAB — CBC
HCT: 41.8 % (ref 36.0–46.0)
Hemoglobin: 13.4 g/dL (ref 12.0–15.0)
MCH: 27.8 pg (ref 26.0–34.0)
MCHC: 32.1 g/dL (ref 30.0–36.0)
MCV: 86.7 fL (ref 78.0–100.0)
PLATELETS: 187 10*3/uL (ref 150–400)
RBC: 4.82 MIL/uL (ref 3.87–5.11)
RDW: 14.3 % (ref 11.5–15.5)
WBC: 8.2 10*3/uL (ref 4.0–10.5)

## 2017-05-27 LAB — DIFFERENTIAL
Basophils Absolute: 0.1 10*3/uL (ref 0.0–0.1)
Basophils Relative: 1 %
Eosinophils Absolute: 0.1 10*3/uL (ref 0.0–0.7)
Eosinophils Relative: 2 %
LYMPHS PCT: 50 %
Lymphs Abs: 4.1 10*3/uL — ABNORMAL HIGH (ref 0.7–4.0)
Monocytes Absolute: 0.7 10*3/uL (ref 0.1–1.0)
Monocytes Relative: 8 %
NEUTROS ABS: 3.2 10*3/uL (ref 1.7–7.7)
NEUTROS PCT: 39 %

## 2017-05-27 LAB — LIPASE, BLOOD: LIPASE: 56 U/L — AB (ref 11–51)

## 2017-05-27 MED ORDER — ONDANSETRON HCL 4 MG/2ML IJ SOLN
4.0000 mg | Freq: Once | INTRAMUSCULAR | Status: AC
  Administered 2017-05-27: 4 mg via INTRAVENOUS
  Filled 2017-05-27: qty 2

## 2017-05-27 MED ORDER — ONDANSETRON HCL 4 MG PO TABS: 4.0000 mg | ORAL_TABLET | Freq: Four times a day (QID) | ORAL | 0 refills | Status: DC | PRN

## 2017-05-27 MED ORDER — SODIUM CHLORIDE 0.9 % IV BOLUS (SEPSIS)
1000.0000 mL | Freq: Once | INTRAVENOUS | Status: AC
  Administered 2017-05-27: 1000 mL via INTRAVENOUS

## 2017-05-27 MED ORDER — OXYCODONE-ACETAMINOPHEN 5-325 MG PO TABS: 1.0000 | ORAL_TABLET | ORAL | 0 refills | Status: DC | PRN

## 2017-05-27 MED ORDER — IOPAMIDOL (ISOVUE-300) INJECTION 61%
100.0000 mL | Freq: Once | INTRAVENOUS | Status: AC | PRN
  Administered 2017-05-27: 100 mL via INTRAVENOUS

## 2017-05-27 MED ORDER — MORPHINE SULFATE (PF) 4 MG/ML IV SOLN
4.0000 mg | Freq: Once | INTRAVENOUS | Status: AC
  Administered 2017-05-27: 4 mg via INTRAVENOUS
  Filled 2017-05-27: qty 1

## 2017-05-27 NOTE — ED Provider Notes (Signed)
[facility] DEPT Provider Note   CSN: [id number] Arrival date & time: 05/27/17  0248     History   Chief Complaint Chief Complaint  Patient presents with  . Abdominal Pain    HPI Virginia Washington is a 61 y.o. female.  The history is provided by the patient.  She woke up with abdominal distention, severe lower abdominal pain, nausea, vomiting. She has vomited 3 times. Abdominal pain is rated at 10/10. It does radiate to her back. She states her last bowel movement was 2 days ago. She has passed some flatus tonight. Nothing makes her pain better nothing makes it worse. She has a history of bowel obstructions and this feels similar to those. She denies fever, chills, sweats.  Past Medical History:  Diagnosis Date  . Chronic back pain   . COPD (chronic obstructive pulmonary disease) ([place])   . Headache   . Hyperlipidemia   . Hypertension   . Normal cardiac stress test 12/2014    Patient Active Problem List   Diagnosis Date Noted  . SBO (small bowel obstruction) ([place]) 08/16/2016  . Hyperlipidemia 08/16/2016  . Chest pain 12/12/2014  . Hypertension 12/12/2014  . Tobacco abuse 10/29/2011    Past Surgical History:  Procedure Laterality Date  . ABDOMINAL HYSTERECTOMY    . CHOLECYSTECTOMY      OB History    Gravida Para Term Preterm AB Living             3   SAB TAB Ectopic Multiple Live Births                   Home Medications    Prior to Admission medications   Medication Sig Start Date End Date Taking? Authorizing Provider  albuterol (PROVENTIL HFA;VENTOLIN HFA) 108 (90 Base) MCG/ACT inhaler Inhale 1-2 puffs into the lungs every 6 (six) hours as needed for wheezing or shortness of breath. 09/06/16  Yes Triplett, Tammy, PA-C  albuterol (PROVENTIL) (2.5 MG/3ML) 0.083% nebulizer solution Take 2.5 mg by nebulization every 6 (six) hours as needed for wheezing or shortness of breath.   Yes [provider]  amLODipine (NORVASC) 5 MG tablet Take 5 mg by mouth  daily. 09/11/16  Yes [provider]  polyethylene glycol (MIRALAX) packet Take 17 g by mouth daily. 02/22/17  Yes Rancour, Annie Main, MD  aspirin 81 MG chewable tablet Chew 1 tablet (81 mg total) by mouth daily. 02/22/17   Rancour, Annie Main, MD  atorvastatin (LIPITOR) 20 MG tablet Take 1 tablet (20 mg total) by mouth daily. 12/23/14   Fay Records, MD  cephALEXin (KEFLEX) 500 MG capsule Take 1 capsule (500 mg total) by mouth 4 (four) times daily. 02/07/17   Lily Kocher, PA-C  docusate sodium (COLACE) 100 MG capsule Take 1 capsule (100 mg total) by mouth every 12 (twelve) hours. 02/22/17   Rancour, Annie Main, MD  traMADol (ULTRAM) 50 MG tablet Take 1 tablet (50 mg total) by mouth every 6 (six) hours as needed. 02/07/17   Lily Kocher, PA-C    Family History Family History  Problem Relation Age of Onset  . Hypertension Mother     Social History Social History  Substance Use Topics  . Smoking status: Current Every Day Smoker    Packs/day: 0.50    Years: 40.00    Types: Cigarettes  . Smokeless tobacco: Never Used  . Alcohol use No     Allergies   Patient has no known allergies.   Review of Systems Review  of Systems  All other systems reviewed and are negative.    Physical Exam Updated Vital Signs BP 122/80   Pulse 74   Temp 98.1 F (36.7 C) (Oral)   Resp 18   Ht 5\' 4"  (1.626 m)   Wt 60.3 kg (133 lb)   SpO2 100%   BMI 22.83 kg/m   Physical Exam  Nursing note and vitals reviewed.  61 year old female, resting comfortably and in no acute distress. Vital signs are normal. Oxygen saturation is 100%, which is normal. Head is normocephalic and atraumatic. PERRLA, EOMI. Oropharynx is clear. Neck is nontender and supple without adenopathy or JVD. Back is nontender and there is no CVA tenderness. Lungs are clear without rales, wheezes, or rhonchi. Chest is nontender. Heart has regular rate and rhythm without murmur. Abdomen is mildly distended with moderate tenderness  across the lower abdomen. There is no rebound or guarding. There are no masses or hepatosplenomegaly and peristalsis is normoactive. Extremities have no cyanosis or edema, full range of motion is present. Skin is warm and dry without rash. Neurologic: Mental status is normal, cranial nerves are intact, there are no motor or sensory deficits.  ED Treatments / Results  Labs (all labs ordered are listed, but only abnormal results are displayed) Labs Reviewed  LIPASE, BLOOD - Abnormal; Notable for the following:       Result Value   Lipase 56 (*)    All other components within normal limits  COMPREHENSIVE METABOLIC PANEL - Abnormal; Notable for the following:    Potassium 3.4 (*)    Glucose, Bld 115 (*)    ALT 13 (*)    All other components within normal limits  URINALYSIS, ROUTINE W REFLEX MICROSCOPIC - Abnormal; Notable for the following:    Color, Urine STRAW (*)    All other components within normal limits  DIFFERENTIAL - Abnormal; Notable for the following:    Lymphs Abs 4.1 (*)    All other components within normal limits  CBC    Radiology Ct Abdomen Pelvis W Contrast  Result Date: 05/27/2017 CLINICAL DATA:  Initial evaluation for acute abdominal pain with nausea and vomiting. EXAM: CT ABDOMEN AND PELVIS WITH CONTRAST TECHNIQUE: Multidetector CT imaging of the abdomen and pelvis was performed using the standard protocol following bolus administration of intravenous contrast. CONTRAST:  135mL ISOVUE-300 IOPAMIDOL (ISOVUE-300) INJECTION 61% COMPARISON:  Prior CT from 02/22/2017. FINDINGS: Lower chest: Scattered atelectatic changes present dependently within the visualized lung bases. Visualized lung bases are otherwise clear. Hepatobiliary: Liver demonstrates a normal contrast enhanced appearance. Gallbladder surgically absent. No biliary dilatation. Pancreas: Pancreas within normal limits. Spleen: Spleen within normal limits. Adrenals/Urinary Tract: Adrenal glands are normal. Kidneys  equal size with symmetric enhancement. No nephrolithiasis, hydronephrosis, or focal enhancing renal mass. Subcentimeter hypodensity within the interpolar right kidney too small the characterize, but statistically likely reflects a small cyst. No hydroureter. Bladder within normal limits. Stomach/Bowel: Stomach largely decompressed without abnormality. No evidence for bowel obstruction. Appendix within normal limits. No acute inflammatory changes about the bowels. Moderate amount retained stool within the proximal and mid colon, which may reflect constipation. Vascular/Lymphatic: Severe aorto bi-iliac atherosclerotic disease, pronounced at the aortic bifurcation and common iliac arteries bilaterally. 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 Flow present distally. Reproductive: Uterus is absent.  Ovaries not discretely identified. Other: No free air or fluid. Musculoskeletal: No acute osseus abnormality. No worrisome lytic or blastic osseous lesions. IMPRESSION: 1.  No CT evidence for acute intra-abdominal or pelvic process. 2. Moderate amount retained stool within the proximal and mid colon, which may reflect underlying constipation. 3. 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 Electronically Signed   By: Jeannine Boga M.D.   On: 05/27/2017 04:58    Procedures Procedures (including critical care time)  Medications Ordered in ED Medications  ondansetron (ZOFRAN) injection 4 mg (4 mg Intravenous Given 05/27/17 0354)  morphine 4 MG/ML injection 4 mg (4 mg Intravenous Given 05/27/17 0354)  sodium chloride 0.9 % bolus 1,000 mL (1,000 mLs Intravenous New Bag/Given 05/27/17 0353)  iopamidol (ISOVUE-300) 61 % injection 100 mL (100 mLs Intravenous Contrast Given 05/27/17 0402)  morphine 4 MG/ML injection 4 mg (4 mg Intravenous Given 05/27/17 0534)     Initial Impression /  Assessment and Plan / ED Course  I have reviewed the triage vital signs and the nursing notes.  Pertinent labs & imaging results that were available during my care of the patient were reviewed by me and considered in my medical decision making (see chart for details).  Abdominal pain and distention of uncertain cause. Old records are reviewed confirming prior hospitalization for small bowel obstruction. Laboratory workup does show mildly elevated lipase of uncertain significance. She will be sent for CT of abdomen and pelvis to look for evidence of recurrent obstruction.  She had modest relief of pain with initial dose of morphine. This was repeated and she had good relief of pain. CT shows no evidence of bowel obstruction, ileus, or any acute pathology. She is discharged with prescriptions for oxycodone have acetaminophen and ondansetron. Return precautions discussed. Follow-up with PCP in 2 days.  Final Clinical Impressions(s) / ED Diagnoses   Final diagnoses:  Lower abdominal pain  Elevated lipase  Non-intractable vomiting with nausea, unspecified vomiting type    New Prescriptions New Prescriptions   ONDANSETRON (ZOFRAN) 4 MG TABLET    Take 1 tablet (4 mg total) by mouth every 6 (six) hours as needed for nausea.   OXYCODONE-ACETAMINOPHEN (PERCOCET) 5-325 MG TABLET    Take 1 tablet by mouth every 4 (four) hours as needed for moderate pain.     Delora Fuel, MD 11/02/51 [phone]

## 2017-05-27 NOTE — Discharge Instructions (Signed)
Increase the amount of Miralax you are taking until you are having regular bowel movements.

## 2017-05-27 NOTE — ED Triage Notes (Signed)
Pt c/o abd pain with n/v that woke pt up from sleep, last bowel movement was a week ago

## 2017-05-29 DIAGNOSIS — R1030 Lower abdominal pain, unspecified: Secondary | ICD-10-CM | POA: Diagnosis not present

## 2017-05-29 DIAGNOSIS — R112 Nausea with vomiting, unspecified: Secondary | ICD-10-CM | POA: Diagnosis not present

## 2017-08-24 ENCOUNTER — Emergency Department (HOSPITAL_COMMUNITY)
Admission: EM | Admit: 2017-08-24 | Discharge: 2017-08-24 | Disposition: A | Discharge status: Home / Self Care | Payer: Medicare Other | Attending: Emergency Medicine | Admitting: Emergency Medicine

## 2017-08-24 ENCOUNTER — Encounter (HOSPITAL_COMMUNITY): Payer: Self-pay | Admitting: Emergency Medicine

## 2017-08-24 DIAGNOSIS — Z79899 Other long term (current) drug therapy: Secondary | ICD-10-CM | POA: Insufficient documentation

## 2017-08-24 DIAGNOSIS — M778 Other enthesopathies, not elsewhere classified: Secondary | ICD-10-CM

## 2017-08-24 DIAGNOSIS — M65842 Other synovitis and tenosynovitis, left hand: Secondary | ICD-10-CM | POA: Diagnosis not present

## 2017-08-24 DIAGNOSIS — F17210 Nicotine dependence, cigarettes, uncomplicated: Secondary | ICD-10-CM | POA: Insufficient documentation

## 2017-08-24 DIAGNOSIS — M79602 Pain in left arm: Secondary | ICD-10-CM | POA: Diagnosis present

## 2017-08-24 DIAGNOSIS — J449 Chronic obstructive pulmonary disease, unspecified: Secondary | ICD-10-CM | POA: Insufficient documentation

## 2017-08-24 DIAGNOSIS — M65849 Other synovitis and tenosynovitis, unspecified hand: Secondary | ICD-10-CM | POA: Insufficient documentation

## 2017-08-24 DIAGNOSIS — I10 Essential (primary) hypertension: Secondary | ICD-10-CM | POA: Diagnosis not present

## 2017-08-24 DIAGNOSIS — Z7982 Long term (current) use of aspirin: Secondary | ICD-10-CM | POA: Insufficient documentation

## 2017-08-24 HISTORY — DX: Acute embolism and thrombosis of unspecified deep veins of unspecified lower extremity: I82.409

## 2017-08-24 MED ORDER — TRAMADOL HCL 50 MG PO TABS: ORAL_TABLET | ORAL | 0 refills | Status: DC

## 2017-08-24 MED ORDER — IBUPROFEN 600 MG PO TABS: 600.0000 mg | ORAL_TABLET | Freq: Four times a day (QID) | ORAL | 0 refills | Status: DC

## 2017-08-24 NOTE — Discharge Instructions (Signed)
Your examination favors tendinitis involving the wrist forearm area. Please use the wrist forearm splint over the next4 or 5 days. Use 600 mg of ibuprofen with breakfast, lunch, dinner, and at bedtime. May use Ultram for more severe pain.This medication may cause drowsiness. Please do not drink, drive, or participate in activity that requires concentration while taking this medication.

## 2017-08-24 NOTE — ED Provider Notes (Signed)
[facility] DEPT Provider Note   CSN: [id number] Arrival date & time: 08/24/17  1022     History   Chief Complaint Chief Complaint  Patient presents with  . Arm Pain    HPI Virginia Washington is a 61 y.o. female.  Pt is a 61 y/o female who presents to the ED with c/o left forearm area pain.  Pt reports this pain starts at the wrist area and radiates to the left elbow. This started 2 or 3 days ago. Pt reports no change in usual routine. No injury to the wrist/forearm area. She has noted some puffiness, but no redness. No reported fever or chills. She request assistance with this issue. Pt has been told she has peripheral vascular disease and will require a stent to the left leg. Pt is concerned if the same thing might be happening to the left arm.      Past Medical History:  Diagnosis Date  . Chronic back pain   . COPD (chronic obstructive pulmonary disease) ([place])   . DVT (deep venous thrombosis) ([place])   . Headache   . Hyperlipidemia   . Hypertension   . Normal cardiac stress test 12/2014    Patient Active Problem List   Diagnosis Date Noted  . SBO (small bowel obstruction) ([place]) 08/16/2016  . Hyperlipidemia 08/16/2016  . Chest pain 12/12/2014  . Hypertension 12/12/2014  . Tobacco abuse 10/29/2011    Past Surgical History:  Procedure Laterality Date  . ABDOMINAL HYSTERECTOMY    . CHOLECYSTECTOMY      OB History    Gravida Para Term Preterm AB Living             3   SAB TAB Ectopic Multiple Live Births                   Home Medications    Prior to Admission medications   Medication Sig Start Date End Date Taking? Authorizing Provider  albuterol (PROVENTIL HFA;VENTOLIN HFA) 108 (90 Base) MCG/ACT inhaler Inhale 1-2 puffs into the lungs every 6 (six) hours as needed for wheezing or shortness of breath. 09/06/16   Triplett, Tammy, PA-C  albuterol (PROVENTIL) (2.5 MG/3ML) 0.083% nebulizer solution Take 2.5 mg by nebulization every 6 (six) hours as needed for  wheezing or shortness of breath.    [provider]  amLODipine (NORVASC) 5 MG tablet Take 5 mg by mouth daily. 09/11/16   [provider]  aspirin 81 MG chewable tablet Chew 1 tablet (81 mg total) by mouth daily. 02/22/17   Rancour, Annie Main, MD  atorvastatin (LIPITOR) 20 MG tablet Take 1 tablet (20 mg total) by mouth daily. 12/23/14   Fay Records, MD  docusate sodium (COLACE) 100 MG capsule Take 1 capsule (100 mg total) by mouth every 12 (twelve) hours. 02/22/17   Rancour, Annie Main, MD  ondansetron (ZOFRAN) 4 MG tablet Take 1 tablet (4 mg total) by mouth every 6 (six) hours as needed for nausea. 02/07/24   Delora Fuel, MD  oxyCODONE-acetaminophen (PERCOCET) 5-325 MG tablet Take 1 tablet by mouth every 4 (four) hours as needed for moderate pain. 04/13/05   Delora Fuel, MD  polyethylene glycol [facility]) packet Take 17 g by mouth daily. 02/22/17   Ezequiel Essex, MD    Family History Family History  Problem Relation Age of Onset  . Hypertension Mother     Social History Social History  Substance Use Topics  . Smoking status: Current Every Day Smoker    Packs/day:  0.50    Years: 40.00    Types: Cigarettes  . Smokeless tobacco: Never Used  . Alcohol use No     Allergies   Patient has no known allergies.   Review of Systems Review of Systems  Musculoskeletal:       Arm pain  All other systems reviewed and are negative.    Physical Exam Updated Vital Signs BP 131/77 (BP Location: Right Arm)   Pulse 73   Temp 98 F (36.7 C) (Oral)   Resp 20   Ht 5\' 2"  (1.575 m)   Wt 60.3 kg (133 lb)   SpO2 100%   BMI 24.33 kg/m   Physical Exam  Constitutional: She is oriented to person, place, and time. She appears well-developed and well-nourished.  Non-toxic appearance.  HENT:  Head: Normocephalic.  Right Ear: Tympanic membrane and external ear normal.  Left Ear: Tympanic membrane and external ear normal.  Eyes: Pupils are equal, round, and reactive to light. EOM and  lids are normal.  Neck: Normal range of motion. Neck supple. Carotid bruit is not present.  Cardiovascular: Normal rate, regular rhythm, normal heart sounds, intact distal pulses and normal pulses.  Exam reveals no gallop and no friction rub.   Pulmonary/Chest: Breath sounds normal. No respiratory distress.  Abdominal: Soft. Bowel sounds are normal. There is no tenderness. There is no guarding.  Musculoskeletal: Normal range of motion. She exhibits tenderness.  There is good range of motion of left shoulder. There is no deformity of the bicep tricep area. The brachial pulses 2+. There is full range of motion of the elbow on the left. The right and left forearm are symmetrical. There is pain with flexion and extension and pronation of theleft wrist. Radial pulses 2+. Capillary refill is less than 2 seconds. There no temperature changes involving the right versus left upper extremity. There is no atrophy noted of the thenar eminence.  Lymphadenopathy:       Head (right side): No submandibular adenopathy present.       Head (left side): No submandibular adenopathy present.    She has no cervical adenopathy.  Neurological: She is alert and oriented to person, place, and time. She has normal strength. No cranial nerve deficit or sensory deficit.  Skin: Skin is warm and dry.  Psychiatric: She has a normal mood and affect. Her speech is normal.  Nursing note and vitals reviewed.    ED Treatments / Results  Labs (all labs ordered are listed, but only abnormal results are displayed) Labs Reviewed - No data to display  EKG  EKG Interpretation None       Radiology No results found.  Procedures Procedures (including critical care time)  Medications Ordered in ED Medications - No data to display   Initial Impression / Assessment and Plan / ED Course  I have reviewed the triage vital signs and the nursing notes.  Pertinent labs & imaging results that were available during my care of the  patient were reviewed by me and considered in my medical decision making (see chart for details).       Final Clinical Impressions(s) / ED Diagnoses MDM Vital signs within normal limits. Patient denies chest pain, unusual sweats, loss of consciousness, or unusual shortness of breath. The left arm pain can be reproduced with flexion and extension of the wrists, and also pronation. I suspect the patient has a tendinitis. Patient fitted with a wrist forearm splint. Prescription for ibuprofen 4 times a day and Ultram  given to the patient. Patient will follow-up with orthopedics if not improving.   Final diagnoses:  Tendonitis of wrist, left    New Prescriptions New Prescriptions   No medications on file     Annette Stable 08/24/17 1201    Noemi Chapel, MD 08/25/17 [phone]

## 2017-08-24 NOTE — ED Triage Notes (Signed)
Patient c/o left arm pain. Per patient radiates from left wrist to left elbow. Denies any injuries-no obvious deformity. Patient states worse with movement. Patient states "I didn't have any circulation in my left leg and I'm supposed to follow up at [facility]." Radial pulse present. Temp of left hand warm.

## 2017-09-18 DIAGNOSIS — I10 Essential (primary) hypertension: Secondary | ICD-10-CM | POA: Diagnosis not present

## 2017-09-18 DIAGNOSIS — R109 Unspecified abdominal pain: Secondary | ICD-10-CM | POA: Diagnosis not present

## 2017-09-18 DIAGNOSIS — Z23 Encounter for immunization: Secondary | ICD-10-CM | POA: Diagnosis not present

## 2017-09-24 ENCOUNTER — Encounter: Payer: Self-pay | Admitting: Gastroenterology

## 2017-10-25 ENCOUNTER — Encounter: Payer: Self-pay | Admitting: Vascular Surgery

## 2017-10-25 ENCOUNTER — Encounter: Payer: Self-pay | Admitting: *Deleted

## 2017-10-25 ENCOUNTER — Ambulatory Visit: Payer: Medicare Other

## 2017-10-25 ENCOUNTER — Encounter (HOSPITAL_COMMUNITY): Payer: Medicare Other

## 2017-10-25 ENCOUNTER — Ambulatory Visit (INDEPENDENT_AMBULATORY_CARE_PROVIDER_SITE_OTHER): Payer: Medicare Other | Admitting: Vascular Surgery

## 2017-10-25 ENCOUNTER — Ambulatory Visit (HOSPITAL_COMMUNITY)
Admission: RE | Admit: 2017-10-25 | Discharge: 2017-10-25 | Disposition: A | Discharge status: Home / Self Care | Payer: Medicare Other | Source: Ambulatory Visit | Attending: Vascular Surgery | Admitting: Vascular Surgery

## 2017-10-25 ENCOUNTER — Other Ambulatory Visit: Payer: Self-pay | Admitting: *Deleted

## 2017-10-25 VITALS — BP 136/78 | HR 69 | Temp 97.9°F | Resp 16 | Ht 62.0 in | Wt 130.0 lb

## 2017-10-25 DIAGNOSIS — Z720 Tobacco use: Secondary | ICD-10-CM | POA: Insufficient documentation

## 2017-10-25 DIAGNOSIS — I739 Peripheral vascular disease, unspecified: Secondary | ICD-10-CM | POA: Diagnosis not present

## 2017-10-25 DIAGNOSIS — F17210 Nicotine dependence, cigarettes, uncomplicated: Secondary | ICD-10-CM | POA: Diagnosis not present

## 2017-10-25 DIAGNOSIS — I10 Essential (primary) hypertension: Secondary | ICD-10-CM | POA: Diagnosis not present

## 2017-10-25 DIAGNOSIS — E785 Hyperlipidemia, unspecified: Secondary | ICD-10-CM | POA: Insufficient documentation

## 2017-10-25 DIAGNOSIS — R0989 Other specified symptoms and signs involving the circulatory and respiratory systems: Secondary | ICD-10-CM | POA: Diagnosis not present

## 2017-10-25 NOTE — H&P (View-Only) (Signed)
HPI: Virginia Washington is a 61 y.o. 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 She currently is able to walk about half a block before experiencing tightness in the calf on the left leg. She currently smokes half pack cigarettes per day. Greater than 3 minutes today spent regarding smoking cessation counseling. She was recently discovered on a CT scan of abdomen and pelvis for abdominal pain to have a chronic left common iliac artery occlusion. She denies rest pain. She denies nonhealing wounds. Other medical problems include COPD chronic back pain hyperlipidemia hypertension. These are all currently stable. She is on Lipitor.  She was last seen in May 2018. At that point she opted for conservative management with a walking program.  Past Medical History:  Diagnosis Date  . Chronic back pain   . COPD (chronic obstructive pulmonary disease) ([place])   . DVT (deep venous thrombosis) ([place])   . Headache   . Hyperlipidemia   . Hypertension   . Normal cardiac stress test 12/2014    Past Surgical History:  Procedure Laterality Date  . ABDOMINAL HYSTERECTOMY    . CHOLECYSTECTOMY      Current Outpatient Medications on File Prior to Visit  Medication Sig Dispense Refill  . albuterol (PROVENTIL HFA;VENTOLIN HFA) 108 (90 Base) MCG/ACT inhaler Inhale 1-2 puffs into the lungs every 6 (six) hours as needed for wheezing or shortness of breath. 1 Inhaler 0  . albuterol (PROVENTIL) (2.5 MG/3ML) 0.083% nebulizer solution Take 2.5 mg by nebulization every 6 (six) hours as needed for wheezing or shortness of breath.    Marland Kitchen amLODipine (NORVASC) 5 MG tablet Take 5 mg by mouth daily.    Marland Kitchen aspirin 81 MG chewable tablet Chew 1 tablet (81 mg total) by mouth daily. 30 tablet 0  . atorvastatin (LIPITOR) 20 MG tablet Take 1 tablet (20 mg total) by mouth daily. 90 tablet 3  . docusate sodium (COLACE) 100 MG capsule Take 1 capsule (100 mg total) by mouth every 12 (twelve) hours. 60 capsule 0  .  ibuprofen (ADVIL,MOTRIN) 600 MG tablet Take 1 tablet (600 mg total) by mouth 4 (four) times daily. 30 tablet 0  . ondansetron (ZOFRAN) 4 MG tablet Take 1 tablet (4 mg total) by mouth every 6 (six) hours as needed for nausea. 12 tablet 0  . oxyCODONE-acetaminophen (PERCOCET) 5-325 MG tablet Take 1 tablet by mouth every 4 (four) hours as needed for moderate pain. 10 tablet 0  . polyethylene glycol (MIRALAX) packet Take 17 g by mouth daily. 14 each 0  . traMADol (ULTRAM) 50 MG tablet 1 or 2 po q6h prn pain. 15 tablet 0   No current facility-administered medications on file prior to visit.     No Known Allergies  Physical exam:  Vitals:   10/25/17 1326  BP: 136/78  Pulse: 69  Resp: 16  Temp: 97.9 F (36.6 C)  TempSrc: Oral  SpO2: 100%  Weight: 130 lb (59 kg)  Height: 5\' 2"  (1.575 m)    Neck: No carotid bruits new  Chest: Clear to auscultation bilaterally  Cardiac: Regular rate and rhythm  Abdomen: Soft nontender  Extremities: 2+ right femoral absent left femoral pulse 2+ right dorsalis pedis pulse absent pedal pulses left foot  Data: I reviewed the patient's CT images from a scan of the abdomen and pelvis from May 2018. This shows a 2 cm in length left common iliac artery occlusion.  Assessment: Patient with lifestyle limiting claudication and  left common iliac occlusion.  Plan: Aortogram lower from a runoff possible intervention scheduled for tomorrow. Risks benefits possible palpitations and procedure details including but not limited to bleeding infection vessel injury contrast reaction were all explained the patient today. She understands and agrees to proceed.   It was again emphasized to the patient that she needs to quit smoking. She is going to try nicotine patches.  [name], MD [facility] Office: [phone] Pager: [phone]

## 2017-10-25 NOTE — Progress Notes (Signed)
HPI: Virginia Washington is a 61 y.o. 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 She currently is able to walk about half a block before experiencing tightness in the calf on the left leg. She currently smokes half pack cigarettes per day. Greater than 3 minutes today spent regarding smoking cessation counseling. She was recently discovered on a CT scan of abdomen and pelvis for abdominal pain to have a chronic left common iliac artery occlusion. She denies rest pain. She denies nonhealing wounds. Other medical problems include COPD chronic back pain hyperlipidemia hypertension. These are all currently stable. She is on Lipitor.  She was last seen in May 2018. At that point she opted for conservative management with a walking program.  Past Medical History:  Diagnosis Date  . Chronic back pain   . COPD (chronic obstructive pulmonary disease) ([place])   . DVT (deep venous thrombosis) ([place])   . Headache   . Hyperlipidemia   . Hypertension   . Normal cardiac stress test 12/2014    Past Surgical History:  Procedure Laterality Date  . ABDOMINAL HYSTERECTOMY    . CHOLECYSTECTOMY      Current Outpatient Medications on File Prior to Visit  Medication Sig Dispense Refill  . albuterol (PROVENTIL HFA;VENTOLIN HFA) 108 (90 Base) MCG/ACT inhaler Inhale 1-2 puffs into the lungs every 6 (six) hours as needed for wheezing or shortness of breath. 1 Inhaler 0  . albuterol (PROVENTIL) (2.5 MG/3ML) 0.083% nebulizer solution Take 2.5 mg by nebulization every 6 (six) hours as needed for wheezing or shortness of breath.    Marland Kitchen amLODipine (NORVASC) 5 MG tablet Take 5 mg by mouth daily.    Marland Kitchen aspirin 81 MG chewable tablet Chew 1 tablet (81 mg total) by mouth daily. 30 tablet 0  . atorvastatin (LIPITOR) 20 MG tablet Take 1 tablet (20 mg total) by mouth daily. 90 tablet 3  . docusate sodium (COLACE) 100 MG capsule Take 1 capsule (100 mg total) by mouth every 12 (twelve) hours. 60 capsule 0  .  ibuprofen (ADVIL,MOTRIN) 600 MG tablet Take 1 tablet (600 mg total) by mouth 4 (four) times daily. 30 tablet 0  . ondansetron (ZOFRAN) 4 MG tablet Take 1 tablet (4 mg total) by mouth every 6 (six) hours as needed for nausea. 12 tablet 0  . oxyCODONE-acetaminophen (PERCOCET) 5-325 MG tablet Take 1 tablet by mouth every 4 (four) hours as needed for moderate pain. 10 tablet 0  . polyethylene glycol (MIRALAX) packet Take 17 g by mouth daily. 14 each 0  . traMADol (ULTRAM) 50 MG tablet 1 or 2 po q6h prn pain. 15 tablet 0   No current facility-administered medications on file prior to visit.     No Known Allergies  Physical exam:  Vitals:   10/25/17 1326  BP: 136/78  Pulse: 69  Resp: 16  Temp: 97.9 F (36.6 C)  TempSrc: Oral  SpO2: 100%  Weight: 130 lb (59 kg)  Height: 5\' 2"  (1.575 m)    Neck: No carotid bruits new  Chest: Clear to auscultation bilaterally  Cardiac: Regular rate and rhythm  Abdomen: Soft nontender  Extremities: 2+ right femoral absent left femoral pulse 2+ right dorsalis pedis pulse absent pedal pulses left foot  Data: I reviewed the patient's CT images from a scan of the abdomen and pelvis from May 2018. This shows a 2 cm in length left common iliac artery occlusion.  Assessment: Patient with lifestyle limiting claudication and  left common iliac occlusion.  Plan: Aortogram lower from a runoff possible intervention scheduled for tomorrow. Risks benefits possible palpitations and procedure details including but not limited to bleeding infection vessel injury contrast reaction were all explained the patient today. She understands and agrees to proceed.   It was again emphasized to the patient that she needs to quit smoking. She is going to try nicotine patches.  [name], MD [facility] Office: [phone] Pager: [phone]

## 2017-10-26 ENCOUNTER — Ambulatory Visit (HOSPITAL_COMMUNITY): Payer: Medicare Other

## 2017-10-26 ENCOUNTER — Encounter (HOSPITAL_COMMUNITY)
Admission: RE | Disposition: A | Discharge status: Home / Self Care | Payer: Self-pay | Source: Ambulatory Visit | Attending: Vascular Surgery

## 2017-10-26 ENCOUNTER — Ambulatory Visit (HOSPITAL_COMMUNITY)
Admission: RE | Admit: 2017-10-26 | Discharge: 2017-10-26 | Disposition: A | Discharge status: Home / Self Care | Payer: Medicare Other | Source: Ambulatory Visit | Attending: Vascular Surgery | Admitting: Vascular Surgery

## 2017-10-26 DIAGNOSIS — Z79899 Other long term (current) drug therapy: Secondary | ICD-10-CM | POA: Diagnosis not present

## 2017-10-26 DIAGNOSIS — G8929 Other chronic pain: Secondary | ICD-10-CM | POA: Diagnosis not present

## 2017-10-26 DIAGNOSIS — Z7982 Long term (current) use of aspirin: Secondary | ICD-10-CM | POA: Diagnosis not present

## 2017-10-26 DIAGNOSIS — I70213 Atherosclerosis of native arteries of extremities with intermittent claudication, bilateral legs: Secondary | ICD-10-CM | POA: Diagnosis not present

## 2017-10-26 DIAGNOSIS — Z86718 Personal history of other venous thrombosis and embolism: Secondary | ICD-10-CM | POA: Diagnosis not present

## 2017-10-26 DIAGNOSIS — R109 Unspecified abdominal pain: Secondary | ICD-10-CM | POA: Diagnosis not present

## 2017-10-26 DIAGNOSIS — I739 Peripheral vascular disease, unspecified: Secondary | ICD-10-CM

## 2017-10-26 DIAGNOSIS — I70212 Atherosclerosis of native arteries of extremities with intermittent claudication, left leg: Secondary | ICD-10-CM | POA: Insufficient documentation

## 2017-10-26 DIAGNOSIS — F17210 Nicotine dependence, cigarettes, uncomplicated: Secondary | ICD-10-CM | POA: Insufficient documentation

## 2017-10-26 DIAGNOSIS — I745 Embolism and thrombosis of iliac artery: Secondary | ICD-10-CM | POA: Diagnosis not present

## 2017-10-26 DIAGNOSIS — I10 Essential (primary) hypertension: Secondary | ICD-10-CM | POA: Insufficient documentation

## 2017-10-26 DIAGNOSIS — J449 Chronic obstructive pulmonary disease, unspecified: Secondary | ICD-10-CM | POA: Diagnosis not present

## 2017-10-26 DIAGNOSIS — E785 Hyperlipidemia, unspecified: Secondary | ICD-10-CM | POA: Diagnosis not present

## 2017-10-26 HISTORY — PX: ABDOMINAL AORTOGRAM W/LOWER EXTREMITY: CATH118223

## 2017-10-26 LAB — POCT I-STAT, CHEM 8
BUN: 10 mg/dL (ref 6–20)
CALCIUM ION: 1.18 mmol/L (ref 1.15–1.40)
CHLORIDE: 108 mmol/L (ref 101–111)
CREATININE: 0.7 mg/dL (ref 0.44–1.00)
GLUCOSE: 105 mg/dL — AB (ref 65–99)
HCT: 39 % (ref 36.0–46.0)
HEMOGLOBIN: 13.3 g/dL (ref 12.0–15.0)
Potassium: 3.4 mmol/L — ABNORMAL LOW (ref 3.5–5.1)
Sodium: 143 mmol/L (ref 135–145)
TCO2: 28 mmol/L (ref 22–32)

## 2017-10-26 SURGERY — ABDOMINAL AORTOGRAM W/LOWER EXTREMITY
Anesthesia: LOCAL

## 2017-10-26 MED ORDER — HYDRALAZINE HCL 20 MG/ML IJ SOLN: 5.0000 mg | INTRAMUSCULAR | Status: DC | PRN

## 2017-10-26 MED ORDER — SODIUM CHLORIDE 0.9% FLUSH: 3.0000 mL | Freq: Two times a day (BID) | INTRAVENOUS | Status: DC

## 2017-10-26 MED ORDER — HEPARIN (PORCINE) IN NACL 2-0.9 UNIT/ML-% IJ SOLN
INTRAMUSCULAR | Status: AC
  Filled 2017-10-26: qty 1000

## 2017-10-26 MED ORDER — MORPHINE SULFATE (PF) 4 MG/ML IV SOLN
INTRAVENOUS | Status: AC
  Administered 2017-10-26: 2 mg
  Filled 2017-10-26: qty 1

## 2017-10-26 MED ORDER — SODIUM CHLORIDE 0.9 % IV SOLN
INTRAVENOUS | Status: DC
  Administered 2017-10-26: 07:00:00 via INTRAVENOUS

## 2017-10-26 MED ORDER — LIDOCAINE HCL (PF) 1 % IJ SOLN
INTRAMUSCULAR | Status: AC
  Filled 2017-10-26: qty 30

## 2017-10-26 MED ORDER — IODIXANOL 320 MG/ML IV SOLN
INTRAVENOUS | Status: DC | PRN
  Administered 2017-10-26: 190 mL via INTRA_ARTERIAL

## 2017-10-26 MED ORDER — SODIUM CHLORIDE 0.9% FLUSH: 3.0000 mL | INTRAVENOUS | Status: DC | PRN

## 2017-10-26 MED ORDER — LIDOCAINE HCL (PF) 1 % IJ SOLN
INTRAMUSCULAR | Status: DC | PRN
  Administered 2017-10-26: 12 mL

## 2017-10-26 MED ORDER — ASPIRIN EC 81 MG PO TBEC: 81.0000 mg | DELAYED_RELEASE_TABLET | Freq: Every day | ORAL | Status: DC

## 2017-10-26 MED ORDER — HEPARIN (PORCINE) IN NACL 2-0.9 UNIT/ML-% IJ SOLN
INTRAMUSCULAR | Status: AC | PRN
  Administered 2017-10-26: 1000 mL via INTRA_ARTERIAL

## 2017-10-26 MED ORDER — LABETALOL HCL 5 MG/ML IV SOLN: 10.0000 mg | INTRAVENOUS | Status: DC | PRN

## 2017-10-26 MED ORDER — SODIUM CHLORIDE 0.9 % IV SOLN: INTRAVENOUS | Status: AC

## 2017-10-26 MED ORDER — SODIUM CHLORIDE 0.9 % IV SOLN: 250.0000 mL | INTRAVENOUS | Status: DC | PRN

## 2017-10-26 MED ORDER — MORPHINE SULFATE (PF) 2 MG/ML IV SOLN: 2.0000 mg | INTRAVENOUS | Status: DC | PRN

## 2017-10-26 SURGICAL SUPPLY — 13 items
CATH ANGIO 5F BER2 65CM (CATHETERS) ×1 IMPLANT
CATH ANGIO 5F PIGTAIL 65CM (CATHETERS) ×1 IMPLANT
CATH CROSS OVER TEMPO 5F (CATHETERS) ×1 IMPLANT
COVER PRB 48X5XTLSCP FOLD TPE (BAG) IMPLANT
COVER PROBE 5X48 (BAG) ×4
DEVICE TORQUE .025-.038 (MISCELLANEOUS) ×1 IMPLANT
GUIDEWIRE LT ZIPWIRE 035X260 (WIRE) ×1 IMPLANT
KIT PV (KITS) ×2 IMPLANT
SHEATH PINNACLE 5F 10CM (SHEATH) ×2 IMPLANT
SYR MEDRAD MARK V 150ML (SYRINGE) ×2 IMPLANT
TRANSDUCER W/STOPCOCK (MISCELLANEOUS) ×2 IMPLANT
TRAY PV CATH (CUSTOM PROCEDURE TRAY) ×2 IMPLANT
WIRE BENTSON .035X145CM (WIRE) ×1 IMPLANT

## 2017-10-26 NOTE — Discharge Instructions (Signed)

## 2017-10-26 NOTE — Interval H&P Note (Signed)
History and Physical Interval Note:  10/26/2017 8:08 AM  Virginia Washington  has presented today for surgery, with the diagnosis of claudication  The various methods of treatment have been discussed with the patient and family. After consideration of risks, benefits and other options for treatment, the patient has consented to  Procedure(s): ABDOMINAL AORTOGRAM W/LOWER EXTREMITY (N/A) as a surgical intervention .  The patient's history has been reviewed, patient examined, no change in status, stable for surgery.  I have reviewed the patient's chart and labs.  Questions were answered to the patient's satisfaction.     [name]

## 2017-10-26 NOTE — Progress Notes (Signed)
Client back from CT scan and states no pain now; Dr Oneida Alar notified of CT scan done

## 2017-10-26 NOTE — Progress Notes (Signed)
Ok to discharge home per Dr Oneida Alar

## 2017-10-26 NOTE — Progress Notes (Signed)
Pt has been hemodynamically stable.  No evidence of groin hematoma but continues to complain of 8/10 back pain  Will obtain CT abdomen pelvis to rule out retroperitoneal hematoma  If CT is negative should be able to go home today   [name], MD [facility] Office: [phone] Pager: [phone]

## 2017-10-26 NOTE — Op Note (Addendum)
Procedure: Abdominal aortogram with lower extremity runoff attempted left iliac recanalization  Preoperative diagnosis: Claudication  Postoperative diagnosis: Same  Anesthesia: Local  Operative findings: #1 chronic occlusion left common iliac artery with reconstitution of the distal common iliac just above the iliac bifurcation.  #2 left lower extremity runoff and tacked #30 significant occlusive disease right leg  Operative details: After obtaining informed consent, patient stating the fibula was placed in supine position the Angio table. Both groins were prepped and draped in usual sterile fashion. Local anesthesia was attributed over the left common femoral artery. Ultrasound was used to identify the left common femoral artery and femoral bifurcation. A suitable site was found in the proximal portion of the common femoral artery and this was cannulated under ultrasound guidance. An 035 versacore wire was then threaded up into the left iliac system but the wire would not pass all within the aorta. A 5 French sheath was placed over the guidewire in the left common femoral artery. Contrast angiogram was performed which showed an occlusion of the distal left common iliac artery. I then made several attempts using an 035 angled Glidewire over a Berenstein 2 catheter to try to cross the lesion. These were all unsuccessful. At this point attention was turned to the right groin. Ultrasound was performed of the right common femoral artery. An introducer needle was then used to cannulate the right common femoral artery after local anesthesia was infiltrated around it. An 035 versacore wire was threaded up the abdominal aorta under aorta under fluoroscopic guidance. A 5 French sheath was placed over the guidewire the right common femoral artery. This was thoroughly flushed with saline. A 5 French pigtail catheter was then placed over the guidewire and the abdominal aorta. An abdominal aortogram was obtained in AP  projection. Left and right renal arteries are widely patent. The right common iliac artery internal iliac artery and external iliac arteries are widely patent. There is a cul-de-sac at the origin of the left common iliac artery and then the artery occludes. The common iliac artery reconstitutes just above the iliac bifurcation and internal iliac and external iliac arteries are widely patent.  Bilateral magnified oblique views of the pelvis were performed after pulling the pigtail catheter just down above the aortic bifurcation. This confirmed the above findings.  At this point bilateral lower extremity runoff views were obtained.  In the left lower extremity, the left common femoral profunda femoris superficial femoral popliteal anterior tibial posterior tibial and peroneal arteries are all patent. There is sluggish flow secondary to the iliac occlusion.  In the right lower extremity there is normal arterial circulation all the way to the level of foot.   At this point I decided to try an antegrade approach to the iliac lesion. The 5 [place] Coude catheter was switched out over guidewire for a 5 [place] crossover catheter. I then used this to selectively catheterize the left common iliac artery. I was able to advance the guidewire down past the lesion but unable to obtain reentry to the iliac system. I then pulled crossover catheter back and exchanging this for a Berenstein 2 catheter and again several attempts were made to cross the lesion which were unsuccessful. At this point attempts to cross the lesion were aborted. The Berenstein catheter was removed over the guidewire and replaced with the pigtail catheter. A final completion angiogram was obtained which showed there was some area of dissection in the proximal common iliac artery but no significant extravasation. The reconstitution of  the distal iliac vessels was still intact. The right iliac system was also intact. At this point the procedure was  concluded. Hemostasis was obtained with direct pressure pulling both sheaths. The patient tolerated the procedure well and there were no complications. The patient was taken to the holding area in stable condition.  Operative management: I discussed with the patient today the options of a femoral-femoral bypass versus continued conservative management with a walking program. She will think about this for a few weeks. If she wishes femoral-femoral bypass we can schedule this for her in the next few weeks.

## 2017-10-29 ENCOUNTER — Encounter (HOSPITAL_COMMUNITY): Payer: Self-pay | Admitting: Vascular Surgery

## 2017-11-01 ENCOUNTER — Encounter (HOSPITAL_COMMUNITY): Payer: Self-pay | Admitting: Vascular Surgery

## 2017-11-16 ENCOUNTER — Encounter: Payer: Self-pay | Admitting: Gastroenterology

## 2017-11-16 ENCOUNTER — Ambulatory Visit (INDEPENDENT_AMBULATORY_CARE_PROVIDER_SITE_OTHER): Payer: Medicare Other | Admitting: Gastroenterology

## 2017-11-16 VITALS — BP 109/66 | HR 70 | Temp 98.4°F | Ht 62.0 in | Wt 130.0 lb

## 2017-11-16 DIAGNOSIS — K5900 Constipation, unspecified: Secondary | ICD-10-CM

## 2017-11-16 DIAGNOSIS — R1084 Generalized abdominal pain: Secondary | ICD-10-CM | POA: Diagnosis not present

## 2017-11-16 DIAGNOSIS — G8929 Other chronic pain: Secondary | ICD-10-CM

## 2017-11-16 DIAGNOSIS — R109 Unspecified abdominal pain: Secondary | ICD-10-CM | POA: Insufficient documentation

## 2017-11-16 NOTE — Progress Notes (Addendum)
REVIEWED-NO ADDITIONAL RECOMMENDATIONS.     Primary Care Physician:  [name], MD Primary Gastroenterologist:  Dr. Oneida Alar   Chief Complaint  Patient presents with  . Abdominal Pain    all over abd, more when she eats greesy foods  . Constipation    HPI:   Virginia Washington is a 61 y.o. female presenting today at the request of Dr. Legrand Rams secondary to abdominal pain. She has not been seen by GI since 2007. Colonoscopy in 2007 by Dr. Laural Golden with suboptimal prep revealing small internal hemorrhoids and otherwise normal. She has had multiple CT scans for abdominal pain over the years.  History of SBO in Aug 2017 conservatively managed. Most recently underwent abdominal aortogram with lower extremity runoff attempted left iliac recanalization; Vascular discussed with the pation possible femoral-femoral bypass versus continued conservative management with a walking program.    Notes intermittent diffuse abdominal pain chronically.  Will bend her over and can't straighten up due to pain. At least a year. Notes at times exacerbated by greasy food but has no fear of eating or food aversion. Good appetite. No weight loss. No N/V. No dysphagia. No reflux. No bright red blood per rectum or melena. Weight stable over the past year. Significant constipation with BM every few weeks. Was taking Miralax but doesn't work for her. Pain improved after BM. No NSAIDs or aspirin powders.   Past Medical History:  Diagnosis Date  . Chronic back pain   . COPD (chronic obstructive pulmonary disease) ([place])   . DVT (deep venous thrombosis) ([place])   . Headache   . Hyperlipidemia   . Hypertension   . Normal cardiac stress test 12/2014    Past Surgical History:  Procedure Laterality Date  . ABDOMINAL AORTOGRAM W/LOWER EXTREMITY N/A 10/26/2017   Procedure: ABDOMINAL AORTOGRAM W/LOWER EXTREMITY;  Surgeon: Elam Dutch, MD;  Location: [facility];  Service: Cardiovascular;  Laterality: N/A;  .  ABDOMINAL HYSTERECTOMY    . CHOLECYSTECTOMY    . COLONOSCOPY  2007   Dr. Laural Golden: small internal hemorrhoids, otherwise normal. exam could be compromised due to quality of prep    Current Outpatient Medications  Medication Sig Dispense Refill  . amLODipine (NORVASC) 5 MG tablet Take 5 mg by mouth daily.    Marland Kitchen atorvastatin (LIPITOR) 20 MG tablet Take 1 tablet (20 mg total) by mouth daily. 90 tablet 3  . pantoprazole (PROTONIX) 40 MG tablet Take 40 mg daily by mouth.  3   No current facility-administered medications for this visit.     Allergies as of 11/16/2017  . (No Known Allergies)    Family History  Problem Relation Age of Onset  . Hypertension Mother   . Colon cancer Neg Hx   . Colon polyps Neg Hx     Social History   Socioeconomic History  . Marital status: Widowed    Spouse name: Not on file  . Number of children: Not on file  . Years of education: Not on file  . Highest education level: Not on file  Social Needs  . Financial resource strain: Not on file  . Food insecurity - worry: Not on file  . Food insecurity - inability: Not on file  . Transportation needs - medical: Not on file  . Transportation needs - non-medical: Not on file  Occupational History  . Occupation: unemployed  Tobacco Use  . Smoking status: Current Every Day Smoker    Packs/day: 0.50    Years: 40.00  Pack years: 20.00    Types: Cigarettes  . Smokeless tobacco: Never Used  Substance and Sexual Activity  . Alcohol use: No    Alcohol/week: 0.0 oz  . Drug use: No  . Sexual activity: No  Other Topics Concern  . Not on file  Social History Narrative  . Not on file    Review of Systems: Gen: Denies any fever, chills, fatigue, weight loss, lack of appetite.  CV: Denies chest pain, heart palpitations, peripheral edema, syncope.  Resp: Denies shortness of breath at rest or with exertion. Denies wheezing or cough.  GI: see HPI  GU : Denies urinary burning, urinary frequency, urinary  hesitancy MS: Denies joint pain, muscle weakness, cramps, or limitation of movement.  Derm: Denies rash, itching, dry skin Psych: Denies depression, anxiety, memory loss, and confusion Heme: Denies bruising, bleeding, and enlarged lymph nodes.  Physical Exam: BP 109/66   Pulse 70   Temp 98.4 F (36.9 C) (Oral)   Ht 5\' 2"  (1.575 m)   Wt 130 lb (59 kg)   BMI 23.78 kg/m  General:   Alert and oriented. Pleasant and cooperative. Well-nourished and well-developed.  Head:  Normocephalic and atraumatic. Eyes:  Without icterus, sclera clear and conjunctiva pink.  Ears:  Normal auditory acuity. Nose:  No deformity, discharge,  or lesions. Mouth:  No deformity or lesions, oral mucosa pink.  Lungs:  Clear to auscultation bilaterally. No wheezes, rales, or rhonchi. No distress.  Heart:  S1, S2 present without murmurs appreciated.  Abdomen:  +BS, soft, non-tender and non-distended. No HSM noted. No guarding or rebound. No masses appreciated.  Rectal:  Deferred  Msk:  Symmetrical without gross deformities. Normal posture. Extremities:  Without  edema. Neurologic:  Alert and  oriented x4 Psych:  Alert and cooperative. Normal mood and affect.

## 2017-11-16 NOTE — Assessment & Plan Note (Signed)
61 year old female with chronic diffuse abdominal discomfort in setting of chronic constipation. Weight has been stable over the past year, and she denies any food aversion, fear of eating. Likely pain is secondary to constipation, and I doubt dealing with any mesenteric disease although she does have known atherosclerotic disease. Will start Linzess 290 mcg once daily and have her return in approximately 4 weeks to arrange colonoscopy. She may need upper endoscopy if she notes any further abdominal pain with eating despite maximized bowel regimen. Multiple CTs on file, and I will review with radiologist a contrast CT to determine if CTA would need to be considered in near future, but again I feel this is low likelihood as her clinical presentation is not consistent with this.

## 2017-11-16 NOTE — Patient Instructions (Signed)
I would like for you to start taking Linzess one capsule each morning, 30 minutes before breakfast. You may start having loose stool the first few days, but this should get better. If not, call me. Let me know how you like this. We may need to adjust dose or change medications.  I would like to see you back in January. We will schedule a colonoscopy then, and you may need an upper endoscopy, which looks into your stomach.  Have a good holiday season!

## 2017-11-17 ENCOUNTER — Encounter (HOSPITAL_COMMUNITY): Payer: Self-pay | Admitting: *Deleted

## 2017-11-17 ENCOUNTER — Emergency Department (HOSPITAL_COMMUNITY): Payer: Medicare Other

## 2017-11-17 ENCOUNTER — Emergency Department (HOSPITAL_COMMUNITY)
Admission: EM | Admit: 2017-11-17 | Discharge: 2017-11-17 | Disposition: A | Discharge status: Home / Self Care | Payer: Medicare Other | Attending: Emergency Medicine | Admitting: Emergency Medicine

## 2017-11-17 DIAGNOSIS — F17200 Nicotine dependence, unspecified, uncomplicated: Secondary | ICD-10-CM | POA: Diagnosis not present

## 2017-11-17 DIAGNOSIS — M778 Other enthesopathies, not elsewhere classified: Secondary | ICD-10-CM | POA: Diagnosis not present

## 2017-11-17 DIAGNOSIS — I10 Essential (primary) hypertension: Secondary | ICD-10-CM | POA: Diagnosis not present

## 2017-11-17 DIAGNOSIS — Z79899 Other long term (current) drug therapy: Secondary | ICD-10-CM | POA: Diagnosis not present

## 2017-11-17 DIAGNOSIS — J449 Chronic obstructive pulmonary disease, unspecified: Secondary | ICD-10-CM | POA: Insufficient documentation

## 2017-11-17 DIAGNOSIS — M65842 Other synovitis and tenosynovitis, left hand: Secondary | ICD-10-CM | POA: Insufficient documentation

## 2017-11-17 DIAGNOSIS — M25532 Pain in left wrist: Secondary | ICD-10-CM | POA: Diagnosis not present

## 2017-11-17 MED ORDER — NAPROXEN 500 MG PO TABS: 500.0000 mg | ORAL_TABLET | Freq: Two times a day (BID) | ORAL | 0 refills | Status: AC

## 2017-11-17 NOTE — ED Provider Notes (Signed)
[facility] EMERGENCY DEPARTMENT Provider Note   CSN: [id number] Arrival date & time: 11/17/17  2005     History   Chief Complaint Chief Complaint  Patient presents with  . Wrist Pain    HPI Virginia Washington is a 61 y.o. female presenting with left wrist pain.  Patient states that she originally started to have pain mid-September.  She was evaluated and given a splint, which she has been using without relief.  She has not been taking anything for pain including Tylenol or ibuprofen.  Pain is worse with movement of her wrist.  Nothing has made it better.  She has not been able to follow-up with orthopedics or contact with them about her continued pain.  She denies fevers, or chills.  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  He she has been wearing a splint at night.  She denies numbness or tingling of her hand or fingers.  She states she is unable to pick up objects because it hurts.  She denies pain in her elbow.  She denies fall, trauma, or injury.  HPI  Past Medical History:  Diagnosis Date  . Chronic back pain   . COPD (chronic obstructive pulmonary disease) ([place])   . DVT (deep venous thrombosis) ([place])   . Headache   . Hyperlipidemia   . Hypertension   . Normal cardiac stress test 12/2014    Patient Active Problem List   Diagnosis Date Noted  . Abdominal pain, chronic, generalized 11/16/2017  . Constipation 11/16/2017  . SBO (small bowel obstruction) ([place]) 08/16/2016  . Hyperlipidemia 08/16/2016  . Chest pain 12/12/2014  . Hypertension 12/12/2014  . Tobacco abuse 10/29/2011    Past Surgical History:  Procedure Laterality Date  . ABDOMINAL AORTOGRAM W/LOWER EXTREMITY N/A 10/26/2017   Procedure: ABDOMINAL AORTOGRAM W/LOWER EXTREMITY;  Surgeon: Elam Dutch, MD;  Location: [facility];  Service: Cardiovascular;  Laterality: N/A;  . ABDOMINAL HYSTERECTOMY    . CHOLECYSTECTOMY    . COLONOSCOPY  2007   Dr.  Laural Golden: small internal hemorrhoids, otherwise normal. exam could be compromised due to quality of prep    OB History    Gravida Para Term Preterm AB Living             3   SAB TAB Ectopic Multiple Live Births                   Home Medications    Prior to Admission medications   Medication Sig Start Date End Date Taking? Authorizing Provider  amLODipine (NORVASC) 5 MG tablet Take 5 mg by mouth daily. 09/11/16   [provider]  atorvastatin (LIPITOR) 20 MG tablet Take 1 tablet (20 mg total) by mouth daily. 12/23/14   Fay Records, MD  naproxen (NAPROSYN) 500 MG tablet Take 1 tablet (500 mg total) by mouth 2 (two) times daily with a meal for 10 days. 11/17/17 11/27/17  Delailah Spieth, PA-C  pantoprazole (PROTONIX) 40 MG tablet Take 40 mg daily by mouth. 09/18/17   [provider]    Family History Family History  Problem Relation Age of Onset  . Hypertension Mother   . Colon cancer Neg Hx   . Colon polyps Neg Hx     Social History Social History   Tobacco Use  . Smoking status: Current Every Day Smoker    Packs/day: 0.50    Years: 40.00    Pack  years: 20.00    Types: Cigarettes  . Smokeless tobacco: Never Used  Substance Use Topics  . Alcohol use: No    Alcohol/week: 0.0 oz  . Drug use: No     Allergies   Patient has no known allergies.   Review of Systems Review of Systems  Musculoskeletal: Positive for arthralgias.  Neurological: Negative for numbness.  Hematological: Does not bruise/bleed easily.     Physical Exam Updated Vital Signs BP 121/73 (BP Location: Right Arm)   Pulse 72   Temp 98.4 F (36.9 C) (Oral)   Resp 16   Ht 5\' 2"  (1.575 m)   Wt 59 kg (130 lb)   SpO2 99%   BMI 23.78 kg/m   Physical Exam  Constitutional: She is oriented to person, place, and time. She appears well-developed and well-nourished. No distress.  HENT:  Head: Normocephalic and atraumatic.  Eyes: EOM are normal.  Neck: Normal range of motion.    Pulmonary/Chest: Effort normal.  Abdominal: She exhibits no distension.  Musculoskeletal: She exhibits tenderness.  Tenderness to palpation of radial left wrist.  No significant swelling.  Increased pain with Finkelstein's test.  Negative Tinel's.  Strength of fingers against resistance intact.  Sensation intact.  Radial pulses equal bilaterally.  No tenderness palpation of the forearm or elbow.  No obvious redness, warmth, or streaking.  No laceration, contusion, or hematoma.  Neurological: She is alert and oriented to person, place, and time. No sensory deficit.  Skin: Skin is warm. No rash noted.  Psychiatric: She has a normal mood and affect.  Nursing note and vitals reviewed.    ED Treatments / Results  Labs (all labs ordered are listed, but only abnormal results are displayed) Labs Reviewed - No data to display  EKG  EKG Interpretation None       Radiology Dg Wrist Complete Left  Result Date: 11/17/2017 CLINICAL DATA:  Left wrist pain, base of digit 1 EXAM: LEFT WRIST - COMPLETE 3+ VIEW COMPARISON:  06/23/2016 FINDINGS: No fracture or malalignment.  Soft tissues are unremarkable. IMPRESSION: No acute osseous abnormality. Electronically Signed   By: Donavan Foil M.D.   On: 11/17/2017 21:28    Procedures Procedures (including critical care time)  Medications Ordered in ED Medications - No data to display   Initial Impression / Assessment and Plan / ED Course  I have reviewed the triage vital signs and the nursing notes.  Pertinent labs & imaging results that were available during my care of the patient were reviewed by me and considered in my medical decision making (see chart for details).     Patient presenting with several months of left wrist pain.  Physical exam reassuring, patient is neurovascularly intact.  Likely tendinitis.  Patient is requesting x-rays.   X-rays show no abnormality of the bony structures of the wrist.  Discussed findings with patient,  discussed likely muscular/tendon etiology.  Will treat with anti-inflammatories and encouraged patient to continue using the wrist splint.  Patient to follow-up with orthopedics if symptoms are not improving.  At this time, patient appears safe for discharge.  Return precautions given.  Patient states she understands and agrees to plan.   Final Clinical Impressions(s) / ED Diagnoses   Final diagnoses:  Left wrist tendonitis    ED Discharge Orders        Ordered    naproxen (NAPROSYN) 500 MG tablet  2 times daily with meals     11/17/17 2143  Franchot Heidelberg, PA-C 11/18/17 0118    Fredia Sorrow, MD 11/23/17 [phone]

## 2017-11-17 NOTE — ED Triage Notes (Signed)
Pt with continued left wrist pain. Seen on 08/24/17 for same.

## 2017-11-17 NOTE — Discharge Instructions (Signed)
Take naproxen 2 times a day with meals for the next 10 days.  Do not take other anti-inflammatories at the same time open (Advil, Motrin, ibuprofen, Aleve). You may supplement with Tylenol if you need further pain control. Use ice packs to help control pain and swelling. Follow-up with the orthopedic doctor listed below if pain is not improving in 10 days.  Wear the splint whenever you are able to, this includes work and at night. If a shorter splint would be more helpful, you may pick this up at a medical supply store. Return to the emergency room if you develop numbness of your fingers, color change of your fingers, fevers, or any new or worsening symptoms.

## 2017-11-19 NOTE — Progress Notes (Signed)
CC'ED TO PCP 

## 2017-11-26 ENCOUNTER — Emergency Department (HOSPITAL_COMMUNITY)
Admission: EM | Admit: 2017-11-26 | Discharge: 2017-11-26 | Disposition: A | Discharge status: Home / Self Care | Payer: Medicare Other | Attending: Emergency Medicine | Admitting: Emergency Medicine

## 2017-11-26 ENCOUNTER — Encounter (HOSPITAL_COMMUNITY): Payer: Self-pay | Admitting: Emergency Medicine

## 2017-11-26 ENCOUNTER — Emergency Department (HOSPITAL_COMMUNITY): Payer: Medicare Other

## 2017-11-26 DIAGNOSIS — M25571 Pain in right ankle and joints of right foot: Secondary | ICD-10-CM | POA: Insufficient documentation

## 2017-11-26 DIAGNOSIS — I10 Essential (primary) hypertension: Secondary | ICD-10-CM | POA: Insufficient documentation

## 2017-11-26 DIAGNOSIS — J449 Chronic obstructive pulmonary disease, unspecified: Secondary | ICD-10-CM | POA: Diagnosis not present

## 2017-11-26 DIAGNOSIS — Z79899 Other long term (current) drug therapy: Secondary | ICD-10-CM | POA: Diagnosis not present

## 2017-11-26 DIAGNOSIS — F17210 Nicotine dependence, cigarettes, uncomplicated: Secondary | ICD-10-CM | POA: Diagnosis not present

## 2017-11-26 MED ORDER — DEXAMETHASONE 4 MG PO TABS: 4.0000 mg | ORAL_TABLET | Freq: Two times a day (BID) | ORAL | 0 refills | Status: DC

## 2017-11-26 MED ORDER — TRAMADOL HCL 50 MG PO TABS: 50.0000 mg | ORAL_TABLET | Freq: Four times a day (QID) | ORAL | 0 refills | Status: DC | PRN

## 2017-11-26 NOTE — ED Triage Notes (Signed)
Patient complaining of right ankle pain upon awakening this morning. Denies injury.

## 2017-11-26 NOTE — ED Provider Notes (Signed)
[facility] EMERGENCY DEPARTMENT Provider Note   CSN: [id number] Arrival date & time: 11/26/17  1318     History   Chief Complaint Chief Complaint  Patient presents with  . Ankle Pain    HPI SYNCERE Virginia Washington is a 61 y.o. female.  Patient is a 61 year old female who presents to the emergency department with a complaint of right ankle pain.  The patient states that she does not recall an injury.  She does not recall being hit or fall on.  She does not recall making any missed steps.  She has not had any recent injury or trauma to the right ankle.  No recent operations or procedures.  The patient states however that it hurts when she makes certain movements or if she applies weight to the ankle in certain ways.  She does not recall having this problem in the past.  The patient states that she has some circulation problems in her left lower extremity.  She is fearful of problems with her right.   The history is provided by the patient.    Past Medical History:  Diagnosis Date  . Chronic back pain   . COPD (chronic obstructive pulmonary disease) ([place])   . DVT (deep venous thrombosis) ([place])   . Headache   . Hyperlipidemia   . Hypertension   . Normal cardiac stress test 12/2014    Patient Active Problem List   Diagnosis Date Noted  . Abdominal pain, chronic, generalized 11/16/2017  . Constipation 11/16/2017  . SBO (small bowel obstruction) ([place]) 08/16/2016  . Hyperlipidemia 08/16/2016  . Chest pain 12/12/2014  . Hypertension 12/12/2014  . Tobacco abuse 10/29/2011    Past Surgical History:  Procedure Laterality Date  . ABDOMINAL AORTOGRAM W/LOWER EXTREMITY N/A 10/26/2017   Procedure: ABDOMINAL AORTOGRAM W/LOWER EXTREMITY;  Surgeon: Elam Dutch, MD;  Location: [place] [facility];  Service: Cardiovascular;  Laterality: N/A;  . ABDOMINAL HYSTERECTOMY    . CHOLECYSTECTOMY    . COLONOSCOPY  2007   Dr. Laural Golden: small internal hemorrhoids, otherwise normal. exam could be  compromised due to quality of prep    OB History    Gravida Para Term Preterm AB Living             3   SAB TAB Ectopic Multiple Live Births                   Home Medications    Prior to Admission medications   Medication Sig Start Date End Date Taking? Authorizing Provider  amLODipine (NORVASC) 5 MG tablet Take 5 mg by mouth daily. 09/11/16   [provider]  atorvastatin (LIPITOR) 20 MG tablet Take 1 tablet (20 mg total) by mouth daily. 12/23/14   Fay Records, MD  naproxen (NAPROSYN) 500 MG tablet Take 1 tablet (500 mg total) by mouth 2 (two) times daily with a meal for 10 days. 11/17/17 11/27/17  Caccavale, Sophia, PA-C  pantoprazole (PROTONIX) 40 MG tablet Take 40 mg daily by mouth. 09/18/17   [provider]    Family History Family History  Problem Relation Age of Onset  . Hypertension Mother   . Colon cancer Neg Hx   . Colon polyps Neg Hx     Social History Social History   Tobacco Use  . Smoking status: Current Every Day Smoker    Packs/day: 0.50    Years: 40.00    Pack years: 20.00    Types: Cigarettes  . Smokeless tobacco:  Never Used  Substance Use Topics  . Alcohol use: No    Alcohol/week: 0.0 oz  . Drug use: No     Allergies   Patient has no known allergies.   Review of Systems Review of Systems  Constitutional: Negative for activity change.       All ROS Neg except as noted in HPI  HENT: Negative for nosebleeds.   Eyes: Negative for photophobia and discharge.  Respiratory: Negative for cough, shortness of breath and wheezing.   Cardiovascular: Negative for chest pain and palpitations.  Gastrointestinal: Negative for abdominal pain and blood in stool.  Genitourinary: Negative for dysuria, frequency and hematuria.  Musculoskeletal: Positive for arthralgias and back pain. Negative for neck pain.  Skin: Negative.   Neurological: Negative for dizziness, seizures and speech difficulty.  Psychiatric/Behavioral: Negative for  confusion and hallucinations.     Physical Exam Updated Vital Signs Ht 5\' 2"  (1.575 m)   Wt 59 kg (130 lb)   BMI 23.78 kg/m   Physical Exam  Constitutional: She is oriented to person, place, and time. She appears well-developed and well-nourished.  Non-toxic appearance.  HENT:  Head: Normocephalic.  Right Ear: Tympanic membrane and external ear normal.  Left Ear: Tympanic membrane and external ear normal.  Eyes: EOM and lids are normal. Pupils are equal, round, and reactive to light.  Neck: Normal range of motion. Neck supple. Carotid bruit is not present.  Cardiovascular: Normal rate, regular rhythm, normal heart sounds, intact distal pulses and normal pulses.  Pulmonary/Chest: Breath sounds normal. No respiratory distress.  Abdominal: Soft. Bowel sounds are normal. There is no tenderness. There is no guarding.  Musculoskeletal: Normal range of motion. She exhibits tenderness.       Right ankle: She exhibits no swelling, no deformity and normal pulse. Tenderness. Medial malleolus tenderness found. Achilles tendon normal.  There are no temperature changes of the right lower extremity.  The dorsalis pedis pulse and the posterior tibial pulses are 2+.  Capillary refill is less than 2 seconds.  There are no lesions between the toes.  There is no evidence of puncture wound to the plantar surface.  The Achilles tendon is intact.  Patient has pain with attempted flexion or extension involving the ankle on the right.  There is also some pain to palpation in the medial malleolar area.  Lymphadenopathy:       Head (right side): No submandibular adenopathy present.       Head (left side): No submandibular adenopathy present.    She has no cervical adenopathy.  Neurological: She is alert and oriented to person, place, and time. She has normal strength. No cranial nerve deficit or sensory deficit.  Skin: Skin is warm and dry.  Psychiatric: She has a normal mood and affect. Her speech is normal.    Nursing note and vitals reviewed.    ED Treatments / Results  Labs (all labs ordered are listed, but only abnormal results are displayed) Labs Reviewed - No data to display  EKG  EKG Interpretation None       Radiology Dg Ankle Complete Right  Result Date: 11/26/2017 CLINICAL DATA:  RIGHT ankle pain upon awakening this morning EXAM: RIGHT ANKLE - COMPLETE 3+ VIEW COMPARISON:  None FINDINGS: Osseous mineralization normal for technique. Joint spaces preserved. Small Achilles insertion calcaneal spur. No acute fracture, dislocation, or bone destruction. IMPRESSION: No acute osseous abnormalities. Electronically Signed   By: Lavonia Dana M.D.   On: 11/26/2017 13:43    Procedures  Procedures (including critical care time)  Medications Ordered in ED Medications - No data to display   Initial Impression / Assessment and Plan / ED Course  I have reviewed the triage vital signs and the nursing notes.  Pertinent labs & imaging results that were available during my care of the patient were reviewed by me and considered in my medical decision making (see chart for details).       Final Clinical Impressions(s) / ED Diagnoses MDM Vital signs reviewed.  Pulse oximetry is 99% on room air.  There are no temperature changes appreciated of the right lower extremity.  The dorsalis pedis and posterior tibial pulses are 2+.  Capillary refill is less than 2 seconds.  Patient has full range of motion of the toes.  She has pain however when she moves her ankle area.  This pain extends from the ankle to the base of the calf muscle area.  I suspect that the patient has a hidden strain/sprain of the ankle, and/or some degenerative changes that is been aggravated by the most recent inclement weather.  The patient will be fitted with an ankle stirrup splint.  She will be treated with Decadron 2 times daily, and use Tylenol every 4 hours.  She will use Ultram for more severe pain.  The patient will  follow up with orthopedics if not improving.  Patient is in agreement with this plan.   Final diagnoses:  Acute right ankle pain    ED Discharge Orders        Ordered    dexamethasone (DECADRON) 4 MG tablet  2 times daily with meals     11/26/17 1432    traMADol (ULTRAM) 50 MG tablet  Every 6 hours PRN     11/26/17 1432       Lily Kocher, PA-C 11/26/17 Starlyn Skeans, MD 11/27/17 [phone]

## 2017-11-26 NOTE — Discharge Instructions (Signed)
Your vital signs have been reviewed.  The examination of your right ankle x-ray shows no fracture, and no dislocation.  There is noted some arthritic changes present.  Please use the ankle stirrup splint over the next 5-7 days.  Use a cane or walker for your protection while walking if this problem gets any worse.  Please use Decadron 2 times daily, and Tylenol every 4 hours for pain.  Use Ultram for more severe pain.  Ultram may cause drowsiness, please use this medication with caution.  Please see Dr. Legrand Rams for follow-up in the office and for possible orthopedic referral if not improving.

## 2017-12-06 DIAGNOSIS — Z1389 Encounter for screening for other disorder: Secondary | ICD-10-CM | POA: Diagnosis not present

## 2017-12-06 DIAGNOSIS — F17200 Nicotine dependence, unspecified, uncomplicated: Secondary | ICD-10-CM | POA: Diagnosis not present

## 2017-12-06 DIAGNOSIS — I10 Essential (primary) hypertension: Secondary | ICD-10-CM | POA: Diagnosis not present

## 2017-12-06 DIAGNOSIS — M1990 Unspecified osteoarthritis, unspecified site: Secondary | ICD-10-CM | POA: Diagnosis not present

## 2017-12-06 DIAGNOSIS — E785 Hyperlipidemia, unspecified: Secondary | ICD-10-CM | POA: Diagnosis not present

## 2017-12-06 DIAGNOSIS — R739 Hyperglycemia, unspecified: Secondary | ICD-10-CM | POA: Diagnosis not present

## 2017-12-06 DIAGNOSIS — E669 Obesity, unspecified: Secondary | ICD-10-CM | POA: Diagnosis not present

## 2017-12-09 DIAGNOSIS — R10814 Left lower quadrant abdominal tenderness: Secondary | ICD-10-CM | POA: Insufficient documentation

## 2017-12-09 DIAGNOSIS — K5900 Constipation, unspecified: Secondary | ICD-10-CM | POA: Diagnosis not present

## 2017-12-09 DIAGNOSIS — R1084 Generalized abdominal pain: Secondary | ICD-10-CM | POA: Insufficient documentation

## 2017-12-09 DIAGNOSIS — R05 Cough: Secondary | ICD-10-CM | POA: Insufficient documentation

## 2017-12-09 DIAGNOSIS — F17210 Nicotine dependence, cigarettes, uncomplicated: Secondary | ICD-10-CM | POA: Diagnosis not present

## 2017-12-09 DIAGNOSIS — J449 Chronic obstructive pulmonary disease, unspecified: Secondary | ICD-10-CM | POA: Insufficient documentation

## 2017-12-09 DIAGNOSIS — Z79899 Other long term (current) drug therapy: Secondary | ICD-10-CM | POA: Insufficient documentation

## 2017-12-09 DIAGNOSIS — R109 Unspecified abdominal pain: Secondary | ICD-10-CM | POA: Diagnosis not present

## 2017-12-09 DIAGNOSIS — I10 Essential (primary) hypertension: Secondary | ICD-10-CM | POA: Diagnosis not present

## 2017-12-10 ENCOUNTER — Emergency Department (HOSPITAL_COMMUNITY): Payer: Medicare Other

## 2017-12-10 ENCOUNTER — Other Ambulatory Visit: Payer: Self-pay

## 2017-12-10 ENCOUNTER — Emergency Department (HOSPITAL_COMMUNITY)
Admission: EM | Admit: 2017-12-10 | Discharge: 2017-12-10 | Disposition: A | Discharge status: Home / Self Care | Payer: Medicare Other | Attending: Emergency Medicine | Admitting: Emergency Medicine

## 2017-12-10 ENCOUNTER — Encounter (HOSPITAL_COMMUNITY): Payer: Self-pay | Admitting: *Deleted

## 2017-12-10 DIAGNOSIS — R109 Unspecified abdominal pain: Secondary | ICD-10-CM | POA: Diagnosis not present

## 2017-12-10 DIAGNOSIS — R1084 Generalized abdominal pain: Secondary | ICD-10-CM | POA: Diagnosis not present

## 2017-12-10 DIAGNOSIS — K5900 Constipation, unspecified: Secondary | ICD-10-CM

## 2017-12-10 DIAGNOSIS — R05 Cough: Secondary | ICD-10-CM | POA: Diagnosis not present

## 2017-12-10 LAB — CBC
HCT: 42.3 % (ref 36.0–46.0)
Hemoglobin: 13.3 g/dL (ref 12.0–15.0)
MCH: 27.9 pg (ref 26.0–34.0)
MCHC: 31.4 g/dL (ref 30.0–36.0)
MCV: 88.7 fL (ref 78.0–100.0)
Platelets: 230 K/uL (ref 150–400)
RBC: 4.77 MIL/uL (ref 3.87–5.11)
RDW: 15 % (ref 11.5–15.5)
WBC: 12.4 K/uL — ABNORMAL HIGH (ref 4.0–10.5)

## 2017-12-10 LAB — COMPREHENSIVE METABOLIC PANEL WITH GFR
ALT: 15 U/L (ref 14–54)
AST: 10 U/L — ABNORMAL LOW (ref 15–41)
Albumin: 3.5 g/dL (ref 3.5–5.0)
Alkaline Phosphatase: 86 U/L (ref 38–126)
Anion gap: 10 (ref 5–15)
BUN: 11 mg/dL (ref 6–20)
CO2: 21 mmol/L — ABNORMAL LOW (ref 22–32)
Calcium: 8.7 mg/dL — ABNORMAL LOW (ref 8.9–10.3)
Chloride: 109 mmol/L (ref 101–111)
Creatinine, Ser: 0.98 mg/dL (ref 0.44–1.00)
GFR calc Af Amer: >60 mL/min (ref >60)
GFR calc non Af Amer: >60 mL/min (ref >60)
Glucose, Bld: 103 mg/dL — ABNORMAL HIGH (ref 65–99)
Potassium: 3.9 mmol/L (ref 3.5–5.1)
Sodium: 140 mmol/L (ref 135–145)
Total Bilirubin: 0.9 mg/dL (ref 0.3–1.2)
Total Protein: 6.6 g/dL (ref 6.5–8.1)

## 2017-12-10 LAB — URINALYSIS, ROUTINE W REFLEX MICROSCOPIC
Bilirubin Urine: NEGATIVE
Glucose, UA: NEGATIVE mg/dL
Hgb urine dipstick: NEGATIVE
Ketones, ur: NEGATIVE mg/dL
Leukocytes, UA: NEGATIVE
Nitrite: NEGATIVE
Protein, ur: NEGATIVE mg/dL
Specific Gravity, Urine: 1.017 (ref 1.005–1.030)
pH: 7 (ref 5.0–8.0)

## 2017-12-10 LAB — LIPASE, BLOOD: Lipase: 38 U/L (ref 11–51)

## 2017-12-10 MED ORDER — LINACLOTIDE 290 MCG PO CAPS: 290.0000 ug | ORAL_CAPSULE | Freq: Every day | ORAL | 0 refills | Status: DC

## 2017-12-10 NOTE — Discharge Instructions (Signed)
Restart the [place] for your abdominal pain and constipation.  Keep your appointments with the gastroenterologist.  Return to the emergency department if you have uncontrolled vomiting, fever, or constant abdominal pain.

## 2017-12-10 NOTE — ED Provider Notes (Signed)
[facility] EMERGENCY DEPARTMENT Provider Note   CSN: [id number] Arrival date & time: 12/09/17  2324  Time seen 3:25 AM   History   Chief Complaint Chief Complaint  Patient presents with  . Abdominal Pain    HPI Virginia Washington is a 61 y.o. female.  HPI patient reports she started getting abdominal pain on December 21.  The pain is diffuse and radiates into her flanks bilaterally.  She describes it as a burning pain.  She states it comes and goes and last about 5-10 minutes.  Walking makes it hurt more, nothing makes it feel better.  She denies nausea, vomiting, diarrhea, constipation, or fever.  She states she has had this pain before and has been referred to a gastroenterologist.  She states they put her "on a pill" and she is to follow-up in January.  She does not know what was the etiology of her problem.  She states she used to have it all the time.  She also reports a dry cough today.  PCP [name], MD   Past Medical History:  Diagnosis Date  . Chronic back pain   . COPD (chronic obstructive pulmonary disease) ([place])   . DVT (deep venous thrombosis) ([place])   . Headache   . Hyperlipidemia   . Hypertension   . Normal cardiac stress test 12/2014    Patient Active Problem List   Diagnosis Date Noted  . Abdominal pain, chronic, generalized 11/16/2017  . Constipation 11/16/2017  . SBO (small bowel obstruction) ([place]) 08/16/2016  . Hyperlipidemia 08/16/2016  . Chest pain 12/12/2014  . Hypertension 12/12/2014  . Tobacco abuse 10/29/2011    Past Surgical History:  Procedure Laterality Date  . ABDOMINAL AORTOGRAM W/LOWER EXTREMITY N/A 10/26/2017   Procedure: ABDOMINAL AORTOGRAM W/LOWER EXTREMITY;  Surgeon: Elam Dutch, MD;  Location: [place] [facility];  Service: Cardiovascular;  Laterality: N/A;  . ABDOMINAL HYSTERECTOMY    . CHOLECYSTECTOMY    . COLONOSCOPY  2007   Dr. Laural Golden: small internal hemorrhoids, otherwise normal. exam could be compromised due to  quality of prep    OB History    Gravida Para Term Preterm AB Living             3   SAB TAB Ectopic Multiple Live Births                   Home Medications    Prior to Admission medications   Medication Sig Start Date End Date Taking? Authorizing Provider  amLODipine (NORVASC) 5 MG tablet Take 5 mg by mouth daily. 09/11/16   [provider]  atorvastatin (LIPITOR) 20 MG tablet Take 1 tablet (20 mg total) by mouth daily. 12/23/14   Fay Records, MD  dexamethasone (DECADRON) 4 MG tablet Take 1 tablet (4 mg total) by mouth 2 (two) times daily with a meal. 11/26/17   Lily Kocher, PA-C  linaclotide [facility]) 290 MCG CAPS capsule Take 1 capsule (290 mcg total) by mouth daily before breakfast. 12/10/17   Rolland Porter, MD  pantoprazole (PROTONIX) 40 MG tablet Take 40 mg daily by mouth. 09/18/17   [provider]  traMADol (ULTRAM) 50 MG tablet Take 1 tablet (50 mg total) by mouth every 6 (six) hours as needed. 11/26/17   Lily Kocher, PA-C    Family History Family History  Problem Relation Age of Onset  . Hypertension Mother   . Colon cancer Neg Hx   . Colon polyps Neg Hx  Social History Social History   Tobacco Use  . Smoking status: Current Every Day Smoker    Packs/day: 0.50    Years: 40.00    Pack years: 20.00    Types: Cigarettes  . Smokeless tobacco: Never Used  Substance Use Topics  . Alcohol use: No    Alcohol/week: 0.0 oz  . Drug use: No     Allergies   Patient has no known allergies.   Review of Systems Review of Systems  All other systems reviewed and are negative.    Physical Exam Updated Vital Signs BP 119/75 (BP Location: Left Arm)   Pulse 85   Temp 99.1 F (37.3 C) (Oral)   Resp 15   Ht 5\' 2"  (1.575 m)   Wt 59 kg (130 lb)   SpO2 99%   BMI 23.78 kg/m   Vital signs normal    Physical Exam  Constitutional: She is oriented to person, place, and time. She appears well-developed and well-nourished.  Non-toxic  appearance. She does not appear ill. No distress.  HENT:  Head: Normocephalic and atraumatic.  Right Ear: External ear normal.  Left Ear: External ear normal.  Nose: Nose normal. No mucosal edema or rhinorrhea.  Mouth/Throat: Oropharynx is clear and moist and mucous membranes are normal. No dental abscesses or uvula swelling.  Eyes: Conjunctivae and EOM are normal. Pupils are equal, round, and reactive to light.  Neck: Normal range of motion and full passive range of motion without pain. Neck supple.  Cardiovascular: Normal rate, regular rhythm and normal heart sounds. Exam reveals no gallop and no friction rub.  No murmur heard. Pulmonary/Chest: Effort normal and breath sounds normal. No respiratory distress. She has no wheezes. She has no rhonchi. She has no rales. She exhibits no tenderness and no crepitus.  Abdominal: Soft. Normal appearance and bowel sounds are normal. She exhibits no distension. There is tenderness in the left lower quadrant. There is no rebound and no guarding.  Plus left flank tenderness  Musculoskeletal: Normal range of motion. She exhibits no edema or tenderness.  Moves all extremities well.   Neurological: She is alert and oriented to person, place, and time. She has normal strength. No cranial nerve deficit.  Skin: Skin is warm, dry and intact. No rash noted. No erythema. No pallor.  Psychiatric: She has a normal mood and affect. Her speech is normal and behavior is normal. Her mood appears not anxious.  Nursing note and vitals reviewed.    ED Treatments / Results  Labs (all labs ordered are listed, but only abnormal results are displayed) Results for orders placed or performed during the hospital encounter of 12/10/17  Lipase, blood  Result Value Ref Range   Lipase 38 11 - 51 U/L  Comprehensive metabolic panel  Result Value Ref Range   Sodium 140 135 - 145 mmol/L   Potassium 3.9 3.5 - 5.1 mmol/L   Chloride 109 101 - 111 mmol/L   CO2 21 (L) 22 - 32  mmol/L   Glucose, Bld 103 (H) 65 - 99 mg/dL   BUN 11 6 - 20 mg/dL   Creatinine, Ser 0.98 0.44 - 1.00 mg/dL   Calcium 8.7 (L) 8.9 - 10.3 mg/dL   Total Protein 6.6 6.5 - 8.1 g/dL   Albumin 3.5 3.5 - 5.0 g/dL   AST 10 (L) 15 - 41 U/L   ALT 15 14 - 54 U/L   Alkaline Phosphatase 86 38 - 126 U/L   Total Bilirubin 0.9 0.3 -  1.2 mg/dL   GFR calc non Af Amer >60 >60 mL/min   GFR calc Af Amer >60 >60 mL/min   Anion gap 10 5 - 15  CBC  Result Value Ref Range   WBC 12.4 (H) 4.0 - 10.5 K/uL   RBC 4.77 3.87 - 5.11 MIL/uL   Hemoglobin 13.3 12.0 - 15.0 g/dL   HCT 42.3 36.0 - 46.0 %   MCV 88.7 78.0 - 100.0 fL   MCH 27.9 26.0 - 34.0 pg   MCHC 31.4 30.0 - 36.0 g/dL   RDW 15.0 11.5 - 15.5 %   Platelets 230 150 - 400 K/uL  Urinalysis, Routine w reflex microscopic  Result Value Ref Range   Color, Urine YELLOW YELLOW   APPearance CLEAR CLEAR   Specific Gravity, Urine 1.017 1.005 - 1.030   pH 7.0 5.0 - 8.0   Glucose, UA NEGATIVE NEGATIVE mg/dL   Hgb urine dipstick NEGATIVE NEGATIVE   Bilirubin Urine NEGATIVE NEGATIVE   Ketones, ur NEGATIVE NEGATIVE mg/dL   Protein, ur NEGATIVE NEGATIVE mg/dL   Nitrite NEGATIVE NEGATIVE   Leukocytes, UA NEGATIVE NEGATIVE   Laboratory interpretation all normal except mild leukocytosis    EKG  EKG Interpretation None       Radiology Dg Chest 2 View  Result Date: 12/10/2017 CLINICAL DATA:  Cough EXAM: CHEST  2 VIEW COMPARISON:  Chest radiograph 02/21/2017 FINDINGS: The heart size and mediastinal contours are within normal limits. Both lungs are clear. The visualized skeletal structures are unremarkable. IMPRESSION: No active cardiopulmonary disease. Electronically Signed   By: Ulyses Jarred M.D.   On: 12/10/2017 04:26   Dg Abd 2 Views  Result Date: 12/10/2017 CLINICAL DATA:  Acute onset of sharp lower bilateral abdominal pain, radiating to the back. Syncope. EXAM: ABDOMEN - 2 VIEW COMPARISON:  CT of the abdomen and pelvis from 10/26/2017 FINDINGS: The  visualized bowel gas pattern is unremarkable. Scattered air and stool filled loops of colon are seen; no abnormal dilatation of small bowel loops is seen to suggest small bowel obstruction. No free intra-abdominal air is identified, though evaluation for free air is limited on a single supine view. Clips are noted within the right upper quadrant, reflecting prior cholecystectomy. Clips are also noted at the left hemipelvis. The visualized osseous structures are within normal limits; the sacroiliac joints are unremarkable in appearance. The visualized lung bases are essentially clear. IMPRESSION: Unremarkable bowel gas pattern; no free intra-abdominal air seen. Small to moderate amount of stool noted in the colon. Electronically Signed   By: Garald Balding M.D.   On: 12/10/2017 04:25    Procedures Procedures (including critical care time)  Medications Ordered in ED Medications - No data to display   Initial Impression / Assessment and Plan / ED Course  I have reviewed the triage vital signs and the nursing notes.  Pertinent labs & imaging results that were available during my care of the patient were reviewed by me and considered in my medical decision making (see chart for details).    Chest x-ray was done to make sure she does not have a pneumonia, x-ray of her abdomen was obtained.  Review of her prior notes show she has been seen by gastroenterology on November 30.  At that point they said she has chronic constipation and they started her on Linzess.  They also wanted her to come back to schedule a colonoscopy.  Recheck 5 AM patient is sleeping.  She states the injection helped her pain.  She states that her abdominal pain was better when she was on the Linzess.  She does not know if she has any refills.  Final Clinical Impressions(s) / ED Diagnoses   Final diagnoses:  Generalized abdominal pain  Constipation, unspecified constipation type    ED Discharge Orders        Ordered     linaclotide Rolan Lipa) 290 MCG CAPS capsule  Daily before breakfast     12/10/17 0512      Plan discharge  Rolland Porter, MD, Barbette Or, MD 12/10/17 [phone]

## 2017-12-10 NOTE — ED Triage Notes (Signed)
Pt c/o abdominal pain that radiates around to her back; pt states she passed out while at work Friday; pt denies any n/v/d or urinary symptoms

## 2018-01-16 ENCOUNTER — Ambulatory Visit (INDEPENDENT_AMBULATORY_CARE_PROVIDER_SITE_OTHER): Payer: Medicare Other | Admitting: Gastroenterology

## 2018-01-16 ENCOUNTER — Encounter: Payer: Self-pay | Admitting: *Deleted

## 2018-01-16 ENCOUNTER — Other Ambulatory Visit: Payer: Self-pay | Admitting: *Deleted

## 2018-01-16 ENCOUNTER — Encounter: Payer: Self-pay | Admitting: Gastroenterology

## 2018-01-16 VITALS — BP 124/75 | HR 70 | Temp 97.6°F | Ht 62.0 in | Wt 129.8 lb

## 2018-01-16 DIAGNOSIS — Z1211 Encounter for screening for malignant neoplasm of colon: Secondary | ICD-10-CM

## 2018-01-16 DIAGNOSIS — I10 Essential (primary) hypertension: Secondary | ICD-10-CM | POA: Diagnosis not present

## 2018-01-16 DIAGNOSIS — M67431 Ganglion, right wrist: Secondary | ICD-10-CM | POA: Diagnosis not present

## 2018-01-16 DIAGNOSIS — K5900 Constipation, unspecified: Secondary | ICD-10-CM | POA: Diagnosis not present

## 2018-01-16 MED ORDER — NA SULFATE-K SULFATE-MG SULF 17.5-3.13-1.6 GM/177ML PO SOLN: 1.0000 | ORAL | 0 refills | Status: DC

## 2018-01-16 MED ORDER — LINACLOTIDE 290 MCG PO CAPS: 290.0000 ug | ORAL_CAPSULE | Freq: Every day | ORAL | 3 refills | Status: DC

## 2018-01-16 NOTE — Progress Notes (Signed)
cc'ed to pcp °

## 2018-01-16 NOTE — Assessment & Plan Note (Signed)
Abdominal pain resolved with Linzes 290 mcg daily and resolution of constipation. No alarm symptoms. Continue this daily. I provided more samples and sent to pharmacy, as she had ran out but did not call us. Return in 3 months for close follow-up.

## 2018-01-16 NOTE — Assessment & Plan Note (Signed)
62 year old female due for routine screening colonoscopy, with last lower GI evaluation in 2007 by Dr Laural Golden. Small internal hemorrhoids noted but exam compromised due to quality of prep. We have maximized her bowel regimen and will have her continue Linzess 290 mcg daily, along with an extra half day of liquids prior to prep.  Proceed with colonoscopy with Dr. Oneida Alar in the near future. The risks, benefits, and alternatives have been discussed in detail with the patient. They state understanding and desire to proceed.

## 2018-01-16 NOTE — Progress Notes (Addendum)
REVIEWED-NO ADDITIONAL RECOMMENDATIONS.  Referring Provider: [name], MD Primary Care Physician:  [name], MD Primary GI: Dr. Oneida Alar   Chief Complaint  Patient presents with  . Abdominal Pain    better    HPI:   Virginia Washington is a 62 y.o. female presenting today with a history of abdominal pain, last seen Nov 2018. Colonoscopy in 2007 by Dr. Laural Golden with suboptimal prep revealing small internal hemorrhoids and otherwise normal. She has had multiple CT scans for abdominal pain over the years.  History of SBO in Aug 2017 conservatively managed. Most recently underwent abdominal aortogram with lower extremity runoff attempted left iliac recanalization; Vascular discussed with the patient possible femoral-femoral bypass versus continued conservative management with a walking program.   Noted significant constipation at visit in Nov 2018 and started Linzess 290 mcg. Known atherosclerotic disease but denied any food aversion, fear of eating, weight loss. Felt constipation was playing a large role. Much better with Linzess. Ran out of samples. No N/V. Pain better with taking Linzess. Had BM every day. No pain with eating. Good appetite. No dysphagia. No rectal bleeding.   Past Medical History:  Diagnosis Date  . Chronic back pain   . COPD (chronic obstructive pulmonary disease) ([place])   . DVT (deep venous thrombosis) ([place])   . Headache   . Hyperlipidemia   . Hypertension   . Normal cardiac stress test 12/2014    Past Surgical History:  Procedure Laterality Date  . ABDOMINAL AORTOGRAM W/LOWER EXTREMITY N/A 10/26/2017   Procedure: ABDOMINAL AORTOGRAM W/LOWER EXTREMITY;  Surgeon: Elam Dutch, MD;  Location: [facility];  Service: Cardiovascular;  Laterality: N/A;  . ABDOMINAL HYSTERECTOMY    . CHOLECYSTECTOMY    . COLONOSCOPY  2007   Dr. Laural Golden: small internal hemorrhoids, otherwise normal. exam could be compromised due to quality of prep    Current Outpatient  Medications  Medication Sig Dispense Refill  . amLODipine (NORVASC) 5 MG tablet Take 5 mg by mouth daily.    Marland Kitchen atorvastatin (LIPITOR) 20 MG tablet Take 1 tablet (20 mg total) by mouth daily. 90 tablet 3  . dexamethasone (DECADRON) 4 MG tablet Take 1 tablet (4 mg total) by mouth 2 (two) times daily with a meal. 10 tablet 0  . linaclotide (LINZESS) 290 MCG CAPS capsule Take 1 capsule (290 mcg total) by mouth daily before breakfast. 30 capsule 0  . pantoprazole (PROTONIX) 40 MG tablet Take 40 mg daily by mouth.  3   No current facility-administered medications for this visit.     Allergies as of 01/16/2018  . (No Known Allergies)    Family History  Problem Relation Age of Onset  . Hypertension Mother   . Colon cancer Neg Hx   . Colon polyps Neg Hx     Social History   Socioeconomic History  . Marital status: Widowed    Spouse name: None  . Number of children: None  . Years of education: None  . Highest education level: None  Social Needs  . Financial resource strain: None  . Food insecurity - worry: None  . Food insecurity - inability: None  . Transportation needs - medical: None  . Transportation needs - non-medical: None  Occupational History  . Occupation: unemployed  Tobacco Use  . Smoking status: Current Every Day Smoker    Packs/day: 0.50    Years: 40.00    Pack years: 20.00    Types: Cigarettes  . Smokeless tobacco: Never Used  Substance and Sexual Activity  . Alcohol use: No    Alcohol/week: 0.0 oz  . Drug use: No  . Sexual activity: No  Other Topics Concern  . None  Social History Narrative  . None    Review of Systems: Gen: Denies fever, chills, anorexia. Denies fatigue, weakness, weight loss.  CV: Denies chest pain, palpitations, syncope, peripheral edema, and claudication. Resp: Denies dyspnea at rest, cough, wheezing, coughing up blood, and pleurisy. GI: see HPI  Derm: Denies rash, itching, dry skin Psych: Denies depression, anxiety, memory  loss, confusion. No homicidal or suicidal ideation.  Heme: Denies bruising, bleeding, and enlarged lymph nodes.  Physical Exam: BP 124/75   Pulse 70   Temp 97.6 F (36.4 C) (Oral)   Ht 5\' 2"  (1.575 m)   Wt 129 lb 12.8 oz (58.9 kg)   BMI 23.74 kg/m  General:   Alert and oriented. No distress noted. Pleasant and cooperative.  Head:  Normocephalic and atraumatic. Eyes:  Conjuctiva clear without scleral icterus. Mouth:  Oral mucosa pink and moist. Good dentition. No lesions. Lungs: clear to auscultation bilaterally Cardiac: S1 S2 present without murmurs  Abdomen:  +BS, soft, non-tender and non-distended. No rebound or guarding. No HSM or masses noted. Msk:  Symmetrical without gross deformities. Normal posture. Extremities:  Without edema. Neurologic:  Alert and  oriented x4 Psych:  Alert and cooperative. Normal mood and affect.

## 2018-01-16 NOTE — Patient Instructions (Signed)
I am glad you are doing better!  We have scheduled you for a colonoscopy with Dr. Oneida Alar in the near future. We are adding on an extra half day of clear liquids so that we can make sure the prep is good. Please follow the letter that we give you, as it will explain it all.  Take Linzess 1 capsule each morning, 30 minutes before breakfast. I gave you a few more samples so that you can go ahead and start today, but I sent the prescription to the pharmacy for you to pick up.  I will see you in several months after the colonoscopy!

## 2018-01-24 ENCOUNTER — Encounter: Payer: Self-pay | Admitting: Orthopaedic Surgery

## 2018-01-24 ENCOUNTER — Ambulatory Visit (INDEPENDENT_AMBULATORY_CARE_PROVIDER_SITE_OTHER): Payer: Medicare Other | Admitting: Orthopaedic Surgery

## 2018-01-24 VITALS — BP 114/67 | HR 66 | Temp 97.1°F | Ht 61.5 in | Wt 132.0 lb

## 2018-01-24 DIAGNOSIS — M67431 Ganglion, right wrist: Secondary | ICD-10-CM | POA: Diagnosis not present

## 2018-01-24 NOTE — Progress Notes (Signed)
Subjective:    Patient ID: Virginia Washington, female    DOB: Oct 29, 1956, 62 y.o.   MRN: [id number]  HPI She has had ganglion cyst of right volar wrist over radial artery for about four or five months.  It bothers her.  She has no paresthesias.  She has no trauma, no redness.  She would like something done for it.  I have told her about aspiration or just let it be.  It may recur after aspiration and she might have to consider surgery.  She would like to have it aspirated.   Review of Systems  Respiratory: Positive for shortness of breath.   Musculoskeletal: Positive for arthralgias.  All other systems reviewed and are negative.  Past Medical History:  Diagnosis Date  . Chronic back pain   . COPD (chronic obstructive pulmonary disease) ([place])   . DVT (deep venous thrombosis) ([place])   . Headache   . Hyperlipidemia   . Hypertension   . Normal cardiac stress test 12/2014    Past Surgical History:  Procedure Laterality Date  . ABDOMINAL AORTOGRAM W/LOWER EXTREMITY N/A 10/26/2017   Procedure: ABDOMINAL AORTOGRAM W/LOWER EXTREMITY;  Surgeon: Elam Dutch, MD;  Location: [facility];  Service: Cardiovascular;  Laterality: N/A;  . ABDOMINAL HYSTERECTOMY    . CHOLECYSTECTOMY    . COLONOSCOPY  2007   Dr. Laural Golden: small internal hemorrhoids, otherwise normal. exam could be compromised due to quality of prep    Current Outpatient Medications on File Prior to Visit  Medication Sig Dispense Refill  . amLODipine (NORVASC) 5 MG tablet Take 5 mg by mouth daily.    Marland Kitchen atorvastatin (LIPITOR) 20 MG tablet Take 1 tablet (20 mg total) by mouth daily. 90 tablet 3  . pantoprazole (PROTONIX) 40 MG tablet Take 40 mg daily by mouth.  3  . linaclotide (LINZESS) 290 MCG CAPS capsule Take 1 capsule (290 mcg total) by mouth daily before breakfast. (Patient not taking: Reported on 01/24/2018) 30 capsule 0  . Na Sulfate-K Sulfate-Mg Sulf 17.5-3.13-1.6 GM/177ML SOLN Take 1 kit by mouth as directed.  (Patient not taking: Reported on 01/24/2018) 1 Bottle 0   No current facility-administered medications on file prior to visit.     Social History   Socioeconomic History  . Marital status: Widowed    Spouse name: Not on file  . Number of children: Not on file  . Years of education: Not on file  . Highest education level: Not on file  Social Needs  . Financial resource strain: Not on file  . Food insecurity - worry: Not on file  . Food insecurity - inability: Not on file  . Transportation needs - medical: Not on file  . Transportation needs - non-medical: Not on file  Occupational History  . Occupation: unemployed  Tobacco Use  . Smoking status: Current Every Day Smoker    Packs/day: 0.50    Years: 40.00    Pack years: 20.00    Types: Cigarettes  . Smokeless tobacco: Never Used  Substance and Sexual Activity  . Alcohol use: No    Alcohol/week: 0.0 oz  . Drug use: No  . Sexual activity: No  Other Topics Concern  . Not on file  Social History Narrative  . Not on file    Family History  Problem Relation Age of Onset  . Hypertension Mother   . Colon cancer Neg Hx   . Colon polyps Neg Hx     BP 114/67  Pulse 66   Temp (!) 97.1 F (36.2 C)   Ht 5' 1.5" (1.562 m)   Wt 132 lb (59.9 kg)   BMI 24.54 kg/m       Objective:   Physical Exam  Constitutional: She is oriented to person, place, and time. She appears well-developed and well-nourished.  HENT:  Head: Normocephalic and atraumatic.  Eyes: Conjunctivae and EOM are normal. Pupils are equal, round, and reactive to light.  Neck: Normal range of motion. Neck supple.  Cardiovascular: Normal rate, regular rhythm and intact distal pulses.  Pulmonary/Chest: Effort normal.  Abdominal: Soft.  Musculoskeletal: She exhibits tenderness (There is a small ganglion cyst over volar wrist over radial artery, size of small pea.  NV intact. ROM full, no redness, left wrist negative.).  Neurological: She is alert and oriented to  person, place, and time. She displays normal reflexes. No cranial nerve deficit. She exhibits normal muscle tone. Coordination normal.  Skin: Skin is warm and dry.  Psychiatric: She has a normal mood and affect. Her behavior is normal. Judgment and thought content normal.  Vitals reviewed.         Assessment & Plan:   Encounter Diagnosis  Name Primary?  . Ganglion cyst of volar aspect of right wrist Yes   Procedure note: After permission from the patient and sterile prep, the ganglion cyst of the volar right wrist was aspirated of about 1 1/2 cc of clear gelatinous fluid by sterile technique tolerated well.  I will see her as needed.  Electronically Signed Sanjuana Kava, MD 2/7/20198:49 AM

## 2018-01-24 NOTE — Patient Instructions (Signed)
Steps to Quit Smoking Smoking tobacco can be bad for your health. It can also affect almost every organ in your body. Smoking puts you and people around you at risk for many serious long-lasting (chronic) diseases. Quitting smoking is hard, but it is one of the best things that you can do for your health. It is never too late to quit. What are the benefits of quitting smoking? When you quit smoking, you lower your risk for getting serious diseases and conditions. They can include:  Lung cancer or lung disease.  Heart disease.  Stroke.  Heart attack.  Not being able to have children (infertility).  Weak bones (osteoporosis) and broken bones (fractures).  If you have coughing, wheezing, and shortness of breath, those symptoms may get better when you quit. You may also get sick less often. If you are pregnant, quitting smoking can help to lower your chances of having a baby of low birth weight. What can I do to help me quit smoking? Talk with your doctor about what can help you quit smoking. Some things you can do (strategies) include:  Quitting smoking totally, instead of slowly cutting back how much you smoke over a period of time.  Going to in-person counseling. You are more likely to quit if you go to many counseling sessions.  Using resources and support systems, such as: ? Online chats with a counselor. ? Phone quitlines. ? Printed self-help materials. ? Support groups or group counseling. ? Text messaging programs. ? Mobile phone apps or applications.  Taking medicines. Some of these medicines may have nicotine in them. If you are pregnant or breastfeeding, do not take any medicines to quit smoking unless your doctor says it is okay. Talk with your doctor about counseling or other things that can help you.  Talk with your doctor about using more than one strategy at the same time, such as taking medicines while you are also going to in-person counseling. This can help make  quitting easier. What things can I do to make it easier to quit? Quitting smoking might feel very hard at first, but there is a lot that you can do to make it easier. Take these steps:  Talk to your family and friends. Ask them to support and encourage you.  Call phone quitlines, reach out to support groups, or work with a counselor.  Ask people who smoke to not smoke around you.  Avoid places that make you want (trigger) to smoke, such as: ? Bars. ? Parties. ? Smoke-break areas at work.  Spend time with people who do not smoke.  Lower the stress in your life. Stress can make you want to smoke. Try these things to help your stress: ? Getting regular exercise. ? Deep-breathing exercises. ? Yoga. ? Meditating. ? Doing a body scan. To do this, close your eyes, focus on one area of your body at a time from head to toe, and notice which parts of your body are tense. Try to relax the muscles in those areas.  Download or buy apps on your mobile phone or tablet that can help you stick to your quit plan. There are many free apps, such as QuitGuide from the CDC (Centers for Disease Control and Prevention). You can find more support from smokefree.gov and other websites.  This information is not intended to replace advice given to you by your health care provider. Make sure you discuss any questions you have with your health care provider. Document Released: 09/30/2009 Document   Revised: 08/01/2016 Document Reviewed: 04/20/2015 Elsevier Interactive Patient Education  2018 Elsevier Inc.  

## 2018-02-15 ENCOUNTER — Encounter (HOSPITAL_COMMUNITY)
Admission: RE | Disposition: A | Discharge status: Home Health | Payer: Self-pay | Source: Ambulatory Visit | Attending: Gastroenterology

## 2018-02-15 ENCOUNTER — Other Ambulatory Visit: Payer: Self-pay

## 2018-02-15 ENCOUNTER — Ambulatory Visit (HOSPITAL_COMMUNITY)
Admission: RE | Admit: 2018-02-15 | Discharge: 2018-02-15 | Disposition: A | Discharge status: Home Health | Payer: Medicare Other | Source: Ambulatory Visit | Attending: Gastroenterology | Admitting: Gastroenterology

## 2018-02-15 ENCOUNTER — Encounter (HOSPITAL_COMMUNITY): Payer: Self-pay | Admitting: *Deleted

## 2018-02-15 DIAGNOSIS — D123 Benign neoplasm of transverse colon: Secondary | ICD-10-CM | POA: Insufficient documentation

## 2018-02-15 DIAGNOSIS — F17210 Nicotine dependence, cigarettes, uncomplicated: Secondary | ICD-10-CM | POA: Diagnosis not present

## 2018-02-15 DIAGNOSIS — I10 Essential (primary) hypertension: Secondary | ICD-10-CM | POA: Insufficient documentation

## 2018-02-15 DIAGNOSIS — Z1212 Encounter for screening for malignant neoplasm of rectum: Secondary | ICD-10-CM

## 2018-02-15 DIAGNOSIS — J449 Chronic obstructive pulmonary disease, unspecified: Secondary | ICD-10-CM | POA: Insufficient documentation

## 2018-02-15 DIAGNOSIS — Z86718 Personal history of other venous thrombosis and embolism: Secondary | ICD-10-CM | POA: Diagnosis not present

## 2018-02-15 DIAGNOSIS — K644 Residual hemorrhoidal skin tags: Secondary | ICD-10-CM | POA: Insufficient documentation

## 2018-02-15 DIAGNOSIS — K648 Other hemorrhoids: Secondary | ICD-10-CM | POA: Insufficient documentation

## 2018-02-15 DIAGNOSIS — K219 Gastro-esophageal reflux disease without esophagitis: Secondary | ICD-10-CM | POA: Insufficient documentation

## 2018-02-15 DIAGNOSIS — Z1211 Encounter for screening for malignant neoplasm of colon: Secondary | ICD-10-CM

## 2018-02-15 DIAGNOSIS — Z79899 Other long term (current) drug therapy: Secondary | ICD-10-CM | POA: Insufficient documentation

## 2018-02-15 DIAGNOSIS — Q438 Other specified congenital malformations of intestine: Secondary | ICD-10-CM | POA: Diagnosis not present

## 2018-02-15 HISTORY — DX: Gastro-esophageal reflux disease without esophagitis: K21.9

## 2018-02-15 HISTORY — PX: COLONOSCOPY: SHX5424

## 2018-02-15 SURGERY — COLONOSCOPY
Anesthesia: Moderate Sedation

## 2018-02-15 MED ORDER — STERILE WATER FOR IRRIGATION IR SOLN
Status: DC | PRN
  Administered 2018-02-15: 09:00:00

## 2018-02-15 MED ORDER — MIDAZOLAM HCL 5 MG/5ML IJ SOLN
INTRAMUSCULAR | Status: AC
  Filled 2018-02-15: qty 10

## 2018-02-15 MED ORDER — MEPERIDINE HCL 100 MG/ML IJ SOLN
INTRAMUSCULAR | Status: AC
  Filled 2018-02-15: qty 2

## 2018-02-15 MED ORDER — SODIUM CHLORIDE 0.9 % IV SOLN
INTRAVENOUS | Status: DC
  Administered 2018-02-15: 08:00:00 via INTRAVENOUS

## 2018-02-15 MED ORDER — MIDAZOLAM HCL 5 MG/5ML IJ SOLN
INTRAMUSCULAR | Status: DC | PRN
  Administered 2018-02-15: 2 mg via INTRAVENOUS
  Administered 2018-02-15: 1 mg via INTRAVENOUS
  Administered 2018-02-15: 2 mg via INTRAVENOUS

## 2018-02-15 MED ORDER — MEPERIDINE HCL 100 MG/ML IJ SOLN
INTRAMUSCULAR | Status: DC | PRN
  Administered 2018-02-15 (×3): 25 mg

## 2018-02-15 NOTE — Discharge Instructions (Signed)
You had 2 small polyps removed. You have  LARGE internal AND EXTERNAL hemorrhoids.   DRINK WATER TO KEEP YOUR URINE LIGHT YELLOW.  FOLLOW A HIGH FIBER DIET. AVOID ITEMS THAT CAUSE BLOATING & GAS. SEE INFO BELOW.  YOUR BIOPSY RESULTS WILL BE AVAILABLE IN MY CHART AFTER MAR 5 AND MY OFFICE WILL CONTACT YOU IN 10-14 DAYS WITH YOUR RESULTS.   Next colonoscopy in BETWEEN 2024-2026.    Colonoscopy Care After Read the instructions outlined below and refer to this sheet in the next week. These discharge instructions provide you with general information on caring for yourself after you leave the hospital. While your treatment has been planned according to the most current medical practices available, unavoidable complications occasionally occur. If you have any problems or questions after discharge, call DR. Alicia Ackert, [phone].  ACTIVITY  You may resume your regular activity, but move at a slower pace for the next 24 hours.   Take frequent rest periods for the next 24 hours.   Walking will help get rid of the air and reduce the bloated feeling in your belly (abdomen).   No driving for 24 hours (because of the medicine (anesthesia) used during the test).   You may shower.   Do not sign any important legal documents or operate any machinery for 24 hours (because of the anesthesia used during the test).    NUTRITION  Drink plenty of fluids.   You may resume your normal diet as instructed by your doctor.   Begin with a light meal and progress to your normal diet. Heavy or fried foods are harder to digest and may make you feel sick to your stomach (nauseated).   Avoid alcoholic beverages for 24 hours or as instructed.    MEDICATIONS  You may resume your normal medications.   WHAT YOU CAN EXPECT TODAY  Some feelings of bloating in the abdomen.   Passage of more gas than usual.   Spotting of blood in your stool or on the toilet paper  .  IF YOU HAD POLYPS REMOVED DURING THE  COLONOSCOPY:  Eat a soft diet IF YOU HAVE NAUSEA, BLOATING, ABDOMINAL PAIN, OR VOMITING.    FINDING OUT THE RESULTS OF YOUR TEST Not all test results are available during your visit. DR. Oneida Alar WILL CALL YOU WITHIN 14 DAYS OF YOUR PROCEDUE WITH YOUR RESULTS. Do not assume everything is normal if you have not heard from DR. Paytan Recine, CALL HER OFFICE AT [phone].  SEEK IMMEDIATE MEDICAL ATTENTION AND CALL THE OFFICE: [phone] IF:  You have more than a spotting of blood in your stool.   Your belly is swollen (abdominal distention).   You are nauseated or vomiting.   You have a temperature over 101F.   You have abdominal pain or discomfort that is severe or gets worse throughout the day.   High-Fiber Diet A high-fiber diet changes your normal diet to include more whole grains, legumes, fruits, and vegetables. Changes in the diet involve replacing refined carbohydrates with unrefined foods. The calorie level of the diet is essentially unchanged. The Dietary Reference Intake (recommended amount) for adult males is 38 grams per day. For adult females, it is 25 grams per day. Pregnant and lactating women should consume 28 grams of fiber per day. Fiber is the intact part of a plant that is not broken down during digestion. Functional fiber is fiber that has been isolated from the plant to provide a beneficial effect in the body. PURPOSE  Increase stool  bulk.   Ease and regulate bowel movements.   Lower cholesterol.   REDUCE RISK OF COLON CANCER  INDICATIONS THAT YOU NEED MORE FIBER  Constipation and hemorrhoids.   Uncomplicated diverticulosis (intestine condition) and irritable bowel syndrome.   Weight management.   As a protective measure against hardening of the arteries (atherosclerosis), diabetes, and cancer.   GUIDELINES FOR INCREASING FIBER IN THE DIET  Start adding fiber to the diet slowly. A gradual increase of about 5 more grams (2 slices of whole-wheat bread, 2  servings of most fruits or vegetables, or 1 bowl of high-fiber cereal) per day is best. Too rapid an increase in fiber may result in constipation, flatulence, and bloating.   Drink enough water and fluids to keep your urine clear or pale yellow. Water, juice, or caffeine-free drinks are recommended. Not drinking enough fluid may cause constipation.   Eat a variety of high-fiber foods rather than one type of fiber.   Try to increase your intake of fiber through using high-fiber foods rather than fiber pills or supplements that contain small amounts of fiber.   The goal is to change the types of food eaten. Do not supplement your present diet with high-fiber foods, but replace foods in your present diet.   INCLUDE A VARIETY OF FIBER SOURCES  Replace refined and processed grains with whole grains, canned fruits with fresh fruits, and incorporate other fiber sources. White rice, white breads, and most bakery goods contain little or no fiber.   Brown whole-grain rice, buckwheat oats, and many fruits and vegetables are all good sources of fiber. These include: broccoli, Brussels sprouts, cabbage, cauliflower, beets, sweet potatoes, white potatoes (skin on), carrots, tomatoes, eggplant, squash, berries, fresh fruits, and dried fruits.   Cereals appear to be the richest source of fiber. Cereal fiber is found in whole grains and bran. Bran is the fiber-rich outer coat of cereal grain, which is largely removed in refining. In whole-grain cereals, the bran remains. In breakfast cereals, the largest amount of fiber is found in those with "bran" in their names. The fiber content is sometimes indicated on the label.   You may need to include additional fruits and vegetables each day.   In baking, for 1 cup white flour, you may use the following substitutions:   1 cup whole-wheat flour minus 2 tablespoons.   1/2 cup white flour plus 1/2 cup whole-wheat flour.   Polyps, Colon  A polyp is extra tissue that  grows inside your body. Colon polyps grow in the large intestine. The large intestine, also called the colon, is part of your digestive system. It is a long, hollow tube at the end of your digestive tract where your body makes and stores stool. Most polyps are not dangerous. They are benign. This means they are not cancerous. But over time, some types of polyps can turn into cancer. Polyps that are smaller than a pea are usually not harmful. But larger polyps could someday become or may already be cancerous. To be safe, doctors remove all polyps and test them.   WHO GETS POLYPS? Anyone can get polyps, but certain people are more likely than others. You may have a greater chance of getting polyps if:  You are over 50.   You have had polyps before.   Someone in your family has had polyps.   Someone in your family has had cancer of the large intestine.   Find out if someone in your family has had polyps. You  may also be more likely to get polyps if you:   Eat a lot of fatty foods   Smoke   Drink alcohol   Do not exercise    PREVENTION There is not one sure way to prevent polyps. You might be able to lower your risk of getting them if you:  Eat more fruits and vegetables and less fatty food.   Do not smoke.   Avoid alcohol.   Exercise every day.   Lose weight if you are overweight.   Eating more calcium and folate can also lower your risk of getting polyps. Some foods that are rich in calcium are milk, cheese, and broccoli. Some foods that are rich in folate are chickpeas, kidney beans, and spinach.   Hemorrhoids Hemorrhoids are dilated (enlarged) veins around the rectum. Sometimes clots will form in the veins. This makes them swollen and painful. These are called thrombosed hemorrhoids. Causes of hemorrhoids include:  Constipation.   Straining to have a bowel movement.   HEAVY LIFTING  HOME CARE INSTRUCTIONS  Eat a well balanced diet and drink 6 to 8 glasses of water  every day to avoid constipation. You may also use a bulk laxative.   Avoid straining to have bowel movements.   Keep anal area dry and clean.   Do not use a donut shaped pillow or sit on the toilet for long periods. This increases blood pooling and pain.   Move your bowels when your body has the urge; this will require less straining and will decrease pain and pressure.

## 2018-02-15 NOTE — Op Note (Signed)
[facility] Patient Name: Virginia Washington Procedure Date: 02/15/2018 8:18 AM MRN: [id number] Date of Birth: [birth year] Attending MD: Barney Drain MD, MD CSN: [id number] Age: 62 Admit Type: Outpatient Procedure:                Colonoscopy WITH COLD SNARE POLYPECTOMY Indications:              Screening for colorectal malignant neoplasm Providers:                Barney Drain MD, MD, Janeece Riggers, RN, Nelma Rothman,                            Technician Referring MD:             [name] MD, MD Medicines:                Meperidine 75 mg IV, Midazolam 5 mg IV Complications:            No immediate complications. Estimated Blood Loss:     Estimated blood loss was minimal. Procedure:                Pre-Anesthesia Assessment:                           - Prior to the procedure, a History and Physical                            was performed, and patient medications and                            allergies were reviewed. The patient's tolerance of                            previous anesthesia was also reviewed. The risks                            and benefits of the procedure and the sedation                            options and risks were discussed with the patient.                            All questions were answered, and informed consent                            was obtained. Prior Anticoagulants: The patient has                            taken no previous anticoagulant or antiplatelet                            agents. ASA Grade Assessment: II - A patient with                            mild systemic disease. After reviewing the risks  and benefits, the patient was deemed in                            satisfactory condition to undergo the procedure.                            After obtaining informed consent, the colonoscope                            was passed under direct vision. Throughout the                            procedure, the patient's  blood pressure, pulse, and                            oxygen saturations were monitored continuously. The                            EC-[id number] ([id number]) scope was introduced through                            the anus and advanced to the the cecum, identified                            by appendiceal orifice and ileocecal valve. The                            colonoscopy was technically difficult and complex                            due to restricted mobility of the colon. Successful                            completion of the procedure was aided by increasing                            the dose of sedation medication, straightening and                            shortening the scope to obtain bowel loop reduction                            and COLOWRAP. The patient tolerated the procedure                            fairly well. The quality of the bowel preparation                            was excellent. The ileocecal valve, appendiceal                            orifice, and rectum were photographed. Scope In: 8:50:20 AM Scope Out: 9:07:35 AM Scope Withdrawal Time: 0 hours 14 minutes 24  seconds  Total Procedure Duration: 0 hours 17 minutes 15 seconds  Findings:      Two sessile polyps were found in the splenic flexure and hepatic       flexure. The polyps were 2 to 4 mm in size. These polyps were removed       with a cold snare. Resection and retrieval were complete.      The recto-sigmoid colon was moderately tortuous.      External and internal hemorrhoids were found during retroflexion. The       hemorrhoids were large. Impression:               - Two 2 to 4 mm polyps at the splenic flexure and                            at the hepatic flexure, removed with a cold snare.                            Resected and retrieved.                           - ANGULATED FIXED RECTOSIGMOID COLON                           - External and internal hemorrhoids. Moderate Sedation:      Moderate  (conscious) sedation was administered by the endoscopy nurse       and supervised by the endoscopist. The following parameters were       monitored: oxygen saturation, heart rate, blood pressure, and response       to care. Total physician intraservice time was 30 minutes. Recommendation:           - Repeat colonoscopy in 5-10 years for surveillance.                           - High fiber diet.                           - Continue present medications.                           - Await pathology results.                           - Patient has a contact number available for                            emergencies. The signs and symptoms of potential                            delayed complications were discussed with the                            patient. Return to normal activities tomorrow.                            Written discharge instructions were provided to the  patient. Procedure Code(s):        --- Professional ---                           [phone], Colonoscopy, flexible; with removal of                            tumor(s), polyp(s), or other lesion(s) by snare                            technique                           99152, Moderate sedation services provided by the                            same physician or other qualified health care                            professional performing the diagnostic or                            therapeutic service that the sedation supports,                            requiring the presence of an independent trained                            observer to assist in the monitoring of the                            patient's level of consciousness and physiological                            status; initial 15 minutes of intraservice time,                            patient age 61 years or older                           [phone], Moderate sedation services; each additional                            15 minutes  intraservice time Diagnosis Code(s):        --- Professional ---                           Z12.11, Encounter for screening for malignant                            neoplasm of colon                           D12.3, Benign neoplasm of transverse colon (hepatic  flexure or splenic flexure)                           K64.8, Other hemorrhoids                           Q43.8, Other specified congenital malformations of                            intestine CPT copyright 2016 American Medical Association. All rights reserved. The codes documented in this report are preliminary and upon coder review may  be revised to meet current compliance requirements. Barney Drain, MD Barney Drain MD, MD 02/15/2018 9:16:51 AM This report has been signed electronically. Number of Addenda: 0

## 2018-02-15 NOTE — H&P (Signed)
Primary Care Physician:  [name], MD Primary Gastroenterologist:  Dr. Oneida Alar  Pre-Procedure History & Physical: HPI:  Virginia Washington is a 62 y.o. female here for [place].  Past Medical History:  Diagnosis Date  . Chronic back pain   . COPD (chronic obstructive pulmonary disease) ([place])   . DVT (deep venous thrombosis) ([place])   . GERD (gastroesophageal reflux disease)   . Headache   . Hyperlipidemia   . Hypertension   . Normal cardiac stress test 12/2014    Past Surgical History:  Procedure Laterality Date  . ABDOMINAL AORTOGRAM W/LOWER EXTREMITY N/A 10/26/2017   Procedure: ABDOMINAL AORTOGRAM W/LOWER EXTREMITY;  Surgeon: Elam Dutch, MD;  Location: [facility];  Service: Cardiovascular;  Laterality: N/A;  . ABDOMINAL HYSTERECTOMY    . CHOLECYSTECTOMY    . COLONOSCOPY  2007   Dr. Laural Golden: small internal hemorrhoids, otherwise normal. exam could be compromised due to quality of prep    Prior to Admission medications   Medication Sig Start Date End Date Taking? Authorizing Provider  amLODipine (NORVASC) 5 MG tablet Take 5 mg by mouth daily. 09/11/16  Yes [provider]  atorvastatin (LIPITOR) 20 MG tablet Take 1 tablet (20 mg total) by mouth daily. 12/23/14  Yes Fay Records, MD  pantoprazole (PROTONIX) 40 MG tablet Take 40 mg daily by mouth. 09/18/17  Yes [provider]  linaclotide Rolan Lipa) 290 MCG CAPS capsule Take 1 capsule (290 mcg total) by mouth daily before breakfast. Patient not taking: Reported on 01/24/2018 12/10/17   Rolland Porter, MD    Allergies as of 01/16/2018  . (No Known Allergies)    Family History  Problem Relation Age of Onset  . Hypertension Mother   . Colon cancer Neg Hx   . Colon polyps Neg Hx     Social History   Socioeconomic History  . Marital status: Widowed    Spouse name: Not on file  . Number of children: Not on file  . Years of education: Not on file  . Highest education level: Not on  file  Social Needs  . Financial resource strain: Not on file  . Food insecurity - worry: Not on file  . Food insecurity - inability: Not on file  . Transportation needs - medical: Not on file  . Transportation needs - non-medical: Not on file  Occupational History  . Occupation: unemployed  Tobacco Use  . Smoking status: Current Every Day Smoker    Packs/day: 0.50    Years: 40.00    Pack years: 20.00    Types: Cigarettes  . Smokeless tobacco: Never Used  Substance and Sexual Activity  . Alcohol use: No    Alcohol/week: 0.0 oz  . Drug use: No  . Sexual activity: No  Other Topics Concern  . Not on file  Social History Narrative  . Not on file    Review of Systems: See HPI, otherwise negative ROS   Physical Exam: BP 101/69   Pulse 70   Temp 98.5 F (36.9 C) (Oral)   Resp 11   Ht 5\' 2"  (1.575 m)   Wt 132 lb (59.9 kg)   SpO2 99%   BMI 24.14 kg/m  General:   Alert,  pleasant and cooperative in NAD Head:  Normocephalic and atraumatic. Neck:  Supple; Lungs:  Clear throughout to auscultation.    Heart:  Regular rate and rhythm. Abdomen:  Soft, nontender and nondistended. Normal bowel sounds, without guarding, and without rebound.  Neurologic:  Alert and  oriented x4;  grossly normal neurologically.  Impression/Plan:    SCREENING  Plan:  1. TCS TODAY DISCUSSED PROCEDURE, BENEFITS, & RISKS: < 1% chance of medication reaction, bleeding, perforation, or rupture of spleen/liver.

## 2018-02-17 ENCOUNTER — Emergency Department (HOSPITAL_COMMUNITY)
Admission: EM | Admit: 2018-02-17 | Discharge: 2018-02-17 | Disposition: A | Discharge status: Home / Self Care | Payer: Medicare Other | Attending: Emergency Medicine | Admitting: Emergency Medicine

## 2018-02-17 ENCOUNTER — Other Ambulatory Visit: Payer: Self-pay

## 2018-02-17 ENCOUNTER — Emergency Department (HOSPITAL_COMMUNITY): Payer: Medicare Other

## 2018-02-17 ENCOUNTER — Encounter (HOSPITAL_COMMUNITY): Payer: Self-pay | Admitting: Emergency Medicine

## 2018-02-17 DIAGNOSIS — F17210 Nicotine dependence, cigarettes, uncomplicated: Secondary | ICD-10-CM | POA: Insufficient documentation

## 2018-02-17 DIAGNOSIS — J40 Bronchitis, not specified as acute or chronic: Secondary | ICD-10-CM

## 2018-02-17 DIAGNOSIS — I10 Essential (primary) hypertension: Secondary | ICD-10-CM | POA: Diagnosis not present

## 2018-02-17 DIAGNOSIS — J069 Acute upper respiratory infection, unspecified: Secondary | ICD-10-CM | POA: Diagnosis not present

## 2018-02-17 DIAGNOSIS — Z79899 Other long term (current) drug therapy: Secondary | ICD-10-CM | POA: Insufficient documentation

## 2018-02-17 DIAGNOSIS — R05 Cough: Secondary | ICD-10-CM | POA: Diagnosis not present

## 2018-02-17 DIAGNOSIS — J449 Chronic obstructive pulmonary disease, unspecified: Secondary | ICD-10-CM | POA: Insufficient documentation

## 2018-02-17 DIAGNOSIS — R509 Fever, unspecified: Secondary | ICD-10-CM | POA: Diagnosis present

## 2018-02-17 MED ORDER — PREDNISONE 20 MG PO TABS
40.0000 mg | ORAL_TABLET | Freq: Once | ORAL | Status: AC
Start: 2018-02-17 — End: 2018-02-17
  Administered 2018-02-17: 40 mg via ORAL
  Filled 2018-02-17: qty 2

## 2018-02-17 MED ORDER — DEXAMETHASONE 4 MG PO TABS: 4.0000 mg | ORAL_TABLET | Freq: Two times a day (BID) | ORAL | 0 refills | Status: DC

## 2018-02-17 MED ORDER — LORATADINE-PSEUDOEPHEDRINE ER 5-120 MG PO TB12: 1.0000 | ORAL_TABLET | Freq: Two times a day (BID) | ORAL | 0 refills | Status: DC

## 2018-02-17 MED ORDER — ONDANSETRON HCL 4 MG PO TABS
4.0000 mg | ORAL_TABLET | Freq: Once | ORAL | Status: AC
  Administered 2018-02-17: 4 mg via ORAL
  Filled 2018-02-17: qty 1

## 2018-02-17 MED ORDER — ALBUTEROL SULFATE HFA 108 (90 BASE) MCG/ACT IN AERS
2.0000 | INHALATION_SPRAY | Freq: Once | RESPIRATORY_TRACT | Status: AC
  Administered 2018-02-17: 2 via RESPIRATORY_TRACT
  Filled 2018-02-17: qty 6.7

## 2018-02-17 MED ORDER — HYDROCODONE-HOMATROPINE 5-1.5 MG/5ML PO SYRP: 5.0000 mL | ORAL_SOLUTION | Freq: Four times a day (QID) | ORAL | 0 refills | Status: DC | PRN

## 2018-02-17 MED ORDER — ALBUTEROL SULFATE (2.5 MG/3ML) 0.083% IN NEBU
2.5000 mg | INHALATION_SOLUTION | Freq: Once | RESPIRATORY_TRACT | Status: AC
  Administered 2018-02-17: 2.5 mg via RESPIRATORY_TRACT
  Filled 2018-02-17: qty 3

## 2018-02-17 NOTE — ED Triage Notes (Signed)
Pt c/o fever, cough, and HA since Friday.

## 2018-02-17 NOTE — Progress Notes (Signed)
Patient instructed on the use of Albuterol MDI with spacer. She was able to demonstrate and voice her understanding.

## 2018-02-17 NOTE — Discharge Instructions (Signed)
Your chest x-ray is negative for acute problem.  Your examination suggest an upper respiratory infection.  Please use Tylenol every 4 hours for temperature elevation and aching.  Albuterol 2 puffs every 4 hours for wheezing and difficulty with breathing.  Please use Decadron and Claritin-D 2 times daily or every 12 hours.  Please increase fluids.  Please wash hands frequently.  May use Hycodan for cough if needed. This medication may cause drowsiness. Please do not drink, drive, or participate in activity that requires concentration while taking this medication.  Please see Dr. Legrand Rams for additional evaluation if not improving.

## 2018-02-17 NOTE — ED Provider Notes (Signed)
[facility] EMERGENCY DEPARTMENT Provider Note   CSN: [id number] Arrival date & time: 02/17/18  1628     History   Chief Complaint Chief Complaint  Patient presents with  . Fever    HPI Virginia Washington is a 62 y.o. female.  Patient is a 62 year old female who presents to the emergency department with a complaint of cough and fever.  Patient states that over the last 2-3 days she has been having problems with cough and congestion.  She feels as though she has been having fever.  She has had chills and body aches.  She is also noted problem with wheezing from time to time.  It is of note that the patient has a history of COPD.  No vomiting or diarrhea reported.  Patient states that the problem is now beginning to interfere with her activities of daily living and her rest.  She presents to the emergency department for assistance with this issue.   The history is provided by the patient.  Fever   Associated symptoms include congestion and cough. Pertinent negatives include no chest pain.    Past Medical History:  Diagnosis Date  . Chronic back pain   . COPD (chronic obstructive pulmonary disease) ([place])   . DVT (deep venous thrombosis) ([place])   . GERD (gastroesophageal reflux disease)   . Headache   . Hyperlipidemia   . Hypertension   . Normal cardiac stress test 12/2014    Patient Active Problem List   Diagnosis Date Noted  . Encounter for screening colonoscopy 01/16/2018  . Abdominal pain, chronic, generalized 11/16/2017  . Constipation 11/16/2017  . SBO (small bowel obstruction) ([place]) 08/16/2016  . Hyperlipidemia 08/16/2016  . Chest pain 12/12/2014  . Hypertension 12/12/2014  . Tobacco abuse 10/29/2011    Past Surgical History:  Procedure Laterality Date  . ABDOMINAL AORTOGRAM W/LOWER EXTREMITY N/A 10/26/2017   Procedure: ABDOMINAL AORTOGRAM W/LOWER EXTREMITY;  Surgeon: Elam Dutch, MD;  Location: [facility];  Service: Cardiovascular;  Laterality:  N/A;  . ABDOMINAL HYSTERECTOMY    . CHOLECYSTECTOMY    . COLONOSCOPY  2007   Dr. Laural Golden: small internal hemorrhoids, otherwise normal. exam could be compromised due to quality of prep    OB History    Gravida Para Term Preterm AB Living             3   SAB TAB Ectopic Multiple Live Births                   Home Medications    Prior to Admission medications   Medication Sig Start Date End Date Taking? Authorizing Provider  amLODipine (NORVASC) 5 MG tablet Take 5 mg by mouth daily. 09/11/16   [provider]  atorvastatin (LIPITOR) 20 MG tablet Take 1 tablet (20 mg total) by mouth daily. 12/23/14   Fay Records, MD  linaclotide [facility]) 290 MCG CAPS capsule Take 1 capsule (290 mcg total) by mouth daily before breakfast. Patient not taking: Reported on 01/24/2018 12/10/17   Rolland Porter, MD  pantoprazole (PROTONIX) 40 MG tablet Take 40 mg daily by mouth. 09/18/17   [provider]    Family History Family History  Problem Relation Age of Onset  . Hypertension Mother   . Colon cancer Neg Hx   . Colon polyps Neg Hx     Social History Social History   Tobacco Use  . Smoking status: Current Every Day Smoker    Packs/day: 0.50  Years: 40.00    Pack years: 20.00    Types: Cigarettes  . Smokeless tobacco: Never Used  Substance Use Topics  . Alcohol use: No    Alcohol/week: 0.0 oz  . Drug use: No     Allergies   Patient has no known allergies.   Review of Systems Review of Systems  Constitutional: Positive for fever. Negative for activity change.       All ROS Neg except as noted in HPI  HENT: Positive for congestion. Negative for nosebleeds.   Eyes: Negative for photophobia and discharge.  Respiratory: Positive for cough and wheezing. Negative for shortness of breath.   Cardiovascular: Negative for chest pain and palpitations.  Gastrointestinal: Negative for abdominal pain and blood in stool.  Genitourinary: Negative for dysuria, frequency and  hematuria.  Musculoskeletal: Positive for myalgias. Negative for arthralgias, back pain and neck pain.  Skin: Negative.   Neurological: Negative for dizziness, seizures and speech difficulty.  Psychiatric/Behavioral: Negative for confusion and hallucinations.     Physical Exam Updated Vital Signs BP 126/75 (BP Location: Right Arm)   Pulse 88   Temp 100 F (37.8 C) (Oral)   Resp 18   Ht 5\' 2"  (1.575 m)   Wt 61.2 kg (135 lb)   SpO2 97%   BMI 24.69 kg/m   Physical Exam  Constitutional: She is oriented to person, place, and time. She appears well-developed and well-nourished.  Non-toxic appearance.  HENT:  Head: Normocephalic.  Right Ear: Tympanic membrane and external ear normal.  Left Ear: Tympanic membrane and external ear normal.  Eyes: EOM and lids are normal. Pupils are equal, round, and reactive to light.  Neck: Normal range of motion. Neck supple. Carotid bruit is not present.  Cardiovascular: Normal rate, regular rhythm, normal heart sounds, intact distal pulses and normal pulses.  Pulmonary/Chest: No respiratory distress. She has wheezes.  Abdominal: Soft. Bowel sounds are normal. There is no tenderness. There is no guarding.  Musculoskeletal: Normal range of motion.  Lymphadenopathy:       Head (right side): No submandibular adenopathy present.       Head (left side): No submandibular adenopathy present.    She has no cervical adenopathy.  Neurological: She is alert and oriented to person, place, and time. She has normal strength. No cranial nerve deficit or sensory deficit.  Skin: Skin is warm and dry.  Psychiatric: She has a normal mood and affect. Her speech is normal.  Nursing note and vitals reviewed.    ED Treatments / Results  Labs (all labs ordered are listed, but only abnormal results are displayed) Labs Reviewed - No data to display  EKG  EKG Interpretation None       Radiology No results found.  Procedures Procedures (including critical  care time)  Medications Ordered in ED Medications  albuterol (PROVENTIL) (2.5 MG/3ML) 0.083% nebulizer solution 2.5 mg (not administered)  albuterol (PROVENTIL HFA;VENTOLIN HFA) 108 (90 Base) MCG/ACT inhaler 2 puff (not administered)  predniSONE (DELTASONE) tablet 40 mg (not administered)  ondansetron (ZOFRAN) tablet 4 mg (not administered)     Initial Impression / Assessment and Plan / ED Course  I have reviewed the triage vital signs and the nursing notes.  Pertinent labs & imaging results that were available during my care of the patient were reviewed by me and considered in my medical decision making (see chart for details).      Final Clinical Impressions(s) / ED Diagnoses MDM  Vital signs reviewed. Patient in no  distress.  Patient treated in the emergency department with albuterol, steroids.  After medications, patient breathing much easier.  Patient ambulated in the room and in the hall without problem.  The patient will use albuterol 2 puffs every 4 hours.  Prescription for Decadron, Claritin-D, and Hycodan given to the patient.  The patient will follow up with Dr. Legrand Rams, or return to the emergency department if any changes in condition or problems.  Patient is in agreement with this plan.   Final diagnoses:  Bronchitis  Upper respiratory tract infection, unspecified type    ED Discharge Orders        Ordered    dexamethasone (DECADRON) 4 MG tablet  2 times daily with meals     02/17/18 1806    loratadine-pseudoephedrine (CLARITIN-D 12 HOUR) 5-120 MG tablet  2 times daily     02/17/18 1806    HYDROcodone-homatropine (HYCODAN) 5-1.5 MG/5ML syrup  Every 6 hours PRN     02/17/18 1807       Lily Kocher, PA-C 02/18/18 Arlie Solomons, MD 02/18/18 [phone]

## 2018-02-18 ENCOUNTER — Telehealth: Payer: Self-pay | Admitting: Gastroenterology

## 2018-02-18 NOTE — Telephone Encounter (Signed)
Pt is aware.  

## 2018-02-18 NOTE — Telephone Encounter (Signed)
Please call pt. She had TWO simple adenomas removed.   DRINK WATER TO KEEP YOUR URINE LIGHT YELLOW. FOLLOW A HIGH FIBER DIET. AVOID ITEMS THAT CAUSE BLOATING & GAS.  Next colonoscopy in BETWEEN 2024-2026.

## 2018-02-19 ENCOUNTER — Encounter (HOSPITAL_COMMUNITY): Payer: Self-pay | Admitting: Gastroenterology

## 2018-02-19 NOTE — Telephone Encounter (Signed)
Reminder in epic °

## 2018-02-22 ENCOUNTER — Emergency Department (HOSPITAL_COMMUNITY): Payer: Medicare Other

## 2018-02-22 ENCOUNTER — Encounter (HOSPITAL_COMMUNITY): Payer: Self-pay

## 2018-02-22 ENCOUNTER — Emergency Department (HOSPITAL_COMMUNITY)
Admission: EM | Admit: 2018-02-22 | Discharge: 2018-02-23 | Disposition: A | Discharge status: Home / Self Care | Payer: Medicare Other | Attending: Emergency Medicine | Admitting: Emergency Medicine

## 2018-02-22 ENCOUNTER — Other Ambulatory Visit: Payer: Self-pay

## 2018-02-22 DIAGNOSIS — R0600 Dyspnea, unspecified: Secondary | ICD-10-CM | POA: Insufficient documentation

## 2018-02-22 DIAGNOSIS — R0602 Shortness of breath: Secondary | ICD-10-CM | POA: Diagnosis not present

## 2018-02-22 DIAGNOSIS — Z79899 Other long term (current) drug therapy: Secondary | ICD-10-CM | POA: Diagnosis not present

## 2018-02-22 DIAGNOSIS — R079 Chest pain, unspecified: Secondary | ICD-10-CM | POA: Diagnosis not present

## 2018-02-22 DIAGNOSIS — J40 Bronchitis, not specified as acute or chronic: Secondary | ICD-10-CM

## 2018-02-22 DIAGNOSIS — J441 Chronic obstructive pulmonary disease with (acute) exacerbation: Secondary | ICD-10-CM | POA: Diagnosis not present

## 2018-02-22 DIAGNOSIS — R05 Cough: Secondary | ICD-10-CM | POA: Diagnosis not present

## 2018-02-22 DIAGNOSIS — F17210 Nicotine dependence, cigarettes, uncomplicated: Secondary | ICD-10-CM | POA: Insufficient documentation

## 2018-02-22 DIAGNOSIS — I10 Essential (primary) hypertension: Secondary | ICD-10-CM | POA: Diagnosis not present

## 2018-02-22 MED ORDER — IPRATROPIUM BROMIDE 0.02 % IN SOLN
0.5000 mg | Freq: Once | RESPIRATORY_TRACT | Status: AC
  Administered 2018-02-22: 0.5 mg via RESPIRATORY_TRACT
  Filled 2018-02-22: qty 2.5

## 2018-02-22 MED ORDER — ALBUTEROL (5 MG/ML) CONTINUOUS INHALATION SOLN
10.0000 mg/h | INHALATION_SOLUTION | Freq: Once | RESPIRATORY_TRACT | Status: AC
  Administered 2018-02-22: 10 mg/h via RESPIRATORY_TRACT
  Filled 2018-02-22: qty 20

## 2018-02-22 MED ORDER — METHYLPREDNISOLONE SODIUM SUCC 125 MG IJ SOLR
125.0000 mg | Freq: Once | INTRAMUSCULAR | Status: AC
  Administered 2018-02-22: 125 mg via INTRAVENOUS
  Filled 2018-02-22: qty 2

## 2018-02-22 NOTE — ED Triage Notes (Signed)
Pt recently diagnosed with pneumonia, states she is still coughing and bringing up phlegm.  Pt denies cp.  Pt received duoneb en route by ems and maintains O2 sats 99%

## 2018-02-23 ENCOUNTER — Other Ambulatory Visit: Payer: Self-pay

## 2018-02-23 DIAGNOSIS — J441 Chronic obstructive pulmonary disease with (acute) exacerbation: Secondary | ICD-10-CM | POA: Diagnosis not present

## 2018-02-23 DIAGNOSIS — R079 Chest pain, unspecified: Secondary | ICD-10-CM | POA: Diagnosis not present

## 2018-02-23 DIAGNOSIS — F17210 Nicotine dependence, cigarettes, uncomplicated: Secondary | ICD-10-CM | POA: Diagnosis not present

## 2018-02-23 LAB — CBC WITH DIFFERENTIAL/PLATELET
Basophils Absolute: 0 10*3/uL (ref 0.0–0.1)
Basophils Relative: 0 %
EOS ABS: 0 10*3/uL (ref 0.0–0.7)
Eosinophils Relative: 0 %
HCT: 40.4 % (ref 36.0–46.0)
HEMOGLOBIN: 12.7 g/dL (ref 12.0–15.0)
LYMPHS ABS: 0.9 10*3/uL (ref 0.7–4.0)
LYMPHS PCT: 10 %
MCH: 27.7 pg (ref 26.0–34.0)
MCHC: 31.4 g/dL (ref 30.0–36.0)
MCV: 88 fL (ref 78.0–100.0)
MONOS PCT: 3 %
Monocytes Absolute: 0.3 10*3/uL (ref 0.1–1.0)
NEUTROS PCT: 87 %
Neutro Abs: 7.8 10*3/uL — ABNORMAL HIGH (ref 1.7–7.7)
Platelets: 255 10*3/uL (ref 150–400)
RBC: 4.59 MIL/uL (ref 3.87–5.11)
RDW: 14.6 % (ref 11.5–15.5)
WBC: 9 10*3/uL (ref 4.0–10.5)

## 2018-02-23 LAB — COMPREHENSIVE METABOLIC PANEL
ALT: 28 U/L (ref 14–54)
AST: 29 U/L (ref 15–41)
Albumin: 3.4 g/dL — ABNORMAL LOW (ref 3.5–5.0)
Alkaline Phosphatase: 93 U/L (ref 38–126)
Anion gap: 15 (ref 5–15)
BUN: 10 mg/dL (ref 6–20)
CHLORIDE: 105 mmol/L (ref 101–111)
CO2: 21 mmol/L — AB (ref 22–32)
CREATININE: 0.93 mg/dL (ref 0.44–1.00)
Calcium: 9.5 mg/dL (ref 8.9–10.3)
Glucose, Bld: 199 mg/dL — ABNORMAL HIGH (ref 65–99)
POTASSIUM: 3.5 mmol/L (ref 3.5–5.1)
SODIUM: 141 mmol/L (ref 135–145)
Total Bilirubin: 0.6 mg/dL (ref 0.3–1.2)
Total Protein: 7.2 g/dL (ref 6.5–8.1)

## 2018-02-23 LAB — TROPONIN I: Troponin I: <0.03 ng/mL (ref <0.03)

## 2018-02-23 LAB — BRAIN NATRIURETIC PEPTIDE: B Natriuretic Peptide: 33 pg/mL (ref 0.0–100.0)

## 2018-02-23 MED ORDER — DOXYCYCLINE HYCLATE 100 MG PO CAPS: 100.0000 mg | ORAL_CAPSULE | Freq: Two times a day (BID) | ORAL | 0 refills | Status: DC

## 2018-02-23 MED ORDER — PREDNISONE 20 MG PO TABS: ORAL_TABLET | ORAL | 0 refills | Status: DC

## 2018-02-23 NOTE — ED Provider Notes (Signed)
[facility] EMERGENCY DEPARTMENT Provider Note   CSN: [id number] Arrival date & time: 02/22/18  2312  Time sen 23:18 PM   History   Chief Complaint Chief Complaint  Patient presents with  . Shortness of Breath    cough    HPI Virginia Washington is a 62 y.o. female.  HPI patient states she had a colonoscopy done on March 1 and when she got home she "got sick".  She states she started coughing and she came to the ED on March 3 and had a chest x-ray done and states she was told she had a "touch of pneumonia".  She was discharged home with dexamethasone and Hycodan cough syrup.  She states she continues to have cough with white sputum production and tonight there was a small amount of blood mixed on the mucus.  She states she has upper central chest pain when she coughs that she describes as sharp.  She states tonight she broke out in a sweat, she states she has been having chills.  She states she went to bed tonight and at about 8 PM she woke up very short of breath.  She states she has inhalers and nebulizers at home and when she uses them they last for short period of time however she gets short of breath again.  She denies nausea now but did have vomiting twice this evening, she denies diarrhea.  PCP [name], MD   Past Medical History:  Diagnosis Date  . Chronic back pain   . COPD (chronic obstructive pulmonary disease) ([place])   . DVT (deep venous thrombosis) ([place])   . GERD (gastroesophageal reflux disease)   . Headache   . Hyperlipidemia   . Hypertension   . Normal cardiac stress test 12/2014    Patient Active Problem List   Diagnosis Date Noted  . Encounter for screening colonoscopy 01/16/2018  . Abdominal pain, chronic, generalized 11/16/2017  . Constipation 11/16/2017  . SBO (small bowel obstruction) ([place]) 08/16/2016  . Hyperlipidemia 08/16/2016  . Chest pain 12/12/2014  . Hypertension 12/12/2014  . Tobacco abuse 10/29/2011    Past Surgical History:  Procedure  Laterality Date  . ABDOMINAL AORTOGRAM W/LOWER EXTREMITY N/A 10/26/2017   Procedure: ABDOMINAL AORTOGRAM W/LOWER EXTREMITY;  Surgeon: Elam Dutch, MD;  Location: [place] CV LAB;  Service: Cardiovascular;  Laterality: N/A;  . ABDOMINAL HYSTERECTOMY    . CHOLECYSTECTOMY    . COLONOSCOPY  2007   Dr. Laural Golden: small internal hemorrhoids, otherwise normal. exam could be compromised due to quality of prep  . COLONOSCOPY N/A 02/15/2018   Procedure: COLONOSCOPY;  Surgeon: Danie Binder, MD;  [facility];  Service: Endoscopy;  Laterality: N/A;  8:30am    OB History    Gravida Para Term Preterm AB Living             3   SAB TAB Ectopic Multiple Live Births                   Home Medications    Prior to Admission medications   Medication Sig Start Date End Date Taking? Authorizing Provider  amLODipine (NORVASC) 5 MG tablet Take 5 mg by mouth daily. 09/11/16   [provider]  atorvastatin (LIPITOR) 20 MG tablet Take 1 tablet (20 mg total) by mouth daily. 12/23/14   Fay Records, MD  dexamethasone (DECADRON) 4 MG tablet Take 1 tablet (4 mg total) by mouth 2 (two) times daily with a meal.  02/17/18   Lily Kocher, PA-C  doxycycline (VIBRAMYCIN) 100 MG capsule Take 1 capsule (100 mg total) by mouth 2 (two) times daily. 02/23/18   Rolland Porter, MD  HYDROcodone-homatropine [facility]) 5-1.5 MG/5ML syrup Take 5 mLs by mouth every 6 (six) hours as needed. 02/17/18   Lily Kocher, PA-C  linaclotide [facility]) 290 MCG CAPS capsule Take 1 capsule (290 mcg total) by mouth daily before breakfast. Patient not taking: Reported on 01/24/2018 12/10/17   Rolland Porter, MD  loratadine-pseudoephedrine (CLARITIN-D 12 HOUR) 5-120 MG tablet Take 1 tablet by mouth 2 (two) times daily. 02/17/18   Lily Kocher, PA-C  pantoprazole (PROTONIX) 40 MG tablet Take 40 mg daily by mouth. 09/18/17   [provider]  predniSONE (DELTASONE) 20 MG tablet Take 3 po QD x 3d , then 2 po QD x 3d then 1 po QD x 3d  02/23/18   Rolland Porter, MD    Family History Family History  Problem Relation Age of Onset  . Hypertension Mother   . Colon cancer Neg Hx   . Colon polyps Neg Hx     Social History Social History   Tobacco Use  . Smoking status: Current Every Day Smoker    Packs/day: 0.50    Years: 40.00    Pack years: 20.00    Types: Cigarettes  . Smokeless tobacco: Never Used  Substance Use Topics  . Alcohol use: No    Alcohol/week: 0.0 oz  . Drug use: No  Grandson lives with her   Allergies   Patient has no known allergies.   Review of Systems Review of Systems  All other systems reviewed and are negative.    Physical Exam Updated Vital Signs BP 127/74 (BP Location: Right Arm)   Pulse 91   Temp 97.7 F (36.5 C) (Oral)   Resp (!) 22   Ht 5\' 2"  (1.575 m)   Wt 59 kg (130 lb)   SpO2 94%   BMI 23.78 kg/m   Vital signs normal   Physical Exam   Patient is a thin female who appears older than her stated age.  She is noted be coughing frequently with coughing spasms. Pupils are equal and reactive to light extraocular muscles are intact, there is no scleral icterus. Nose is normal without rhinorrhea External ear appeared normal. Oropharynx has dry mucous membranes without erythema or uvular shift Neck is supple without JVD Cardiovascular patient's heart rate is regular and normal, there are no obvious murmurs heard. Pulmonary patient has difficulty taking a deep breath which initiate spasms of coughing.  When I listen she is having wheezing and rhonchi and diminished breath sounds.  She has some retractions present she appears tachypneic. Abdomen is soft and nontender Neuro patient is alert and cooperative, there is no facial asymmetry Skin there is no obvious rash or lesions skin is warm and dry Musculoskeletal patient moves all extremities well, there is no pitting edema Psych patient is alert and cooperative and acts appropriate.   ED Treatments / Results  Labs (all  labs ordered are listed, but only abnormal results are displayed) Results for orders placed or performed during the hospital encounter of 02/22/18  Comprehensive metabolic panel  Result Value Ref Range   Sodium 141 135 - 145 mmol/L   Potassium 3.5 3.5 - 5.1 mmol/L   Chloride 105 101 - 111 mmol/L   CO2 21 (L) 22 - 32 mmol/L   Glucose, Bld 199 (H) 65 - 99 mg/dL   BUN 10 6 -  20 mg/dL   Creatinine, Ser 0.93 0.44 - 1.00 mg/dL   Calcium 9.5 8.9 - 10.3 mg/dL   Total Protein 7.2 6.5 - 8.1 g/dL   Albumin 3.4 (L) 3.5 - 5.0 g/dL   AST 29 15 - 41 U/L   ALT 28 14 - 54 U/L   Alkaline Phosphatase 93 38 - 126 U/L   Total Bilirubin 0.6 0.3 - 1.2 mg/dL   GFR calc non Af Amer >60 >60 mL/min   GFR calc Af Amer >60 >60 mL/min   Anion gap 15 5 - 15  CBC with Differential  Result Value Ref Range   WBC 9.0 4.0 - 10.5 K/uL   RBC 4.59 3.87 - 5.11 MIL/uL   Hemoglobin 12.7 12.0 - 15.0 g/dL   HCT 40.4 36.0 - 46.0 %   MCV 88.0 78.0 - 100.0 fL   MCH 27.7 26.0 - 34.0 pg   MCHC 31.4 30.0 - 36.0 g/dL   RDW 14.6 11.5 - 15.5 %   Platelets 255 150 - 400 K/uL   Neutrophils Relative % 87 %   Neutro Abs 7.8 (H) 1.7 - 7.7 K/uL   Lymphocytes Relative 10 %   Lymphs Abs 0.9 0.7 - 4.0 K/uL   Monocytes Relative 3 %   Monocytes Absolute 0.3 0.1 - 1.0 K/uL   Eosinophils Relative 0 %   Eosinophils Absolute 0.0 0.0 - 0.7 K/uL   Basophils Relative 0 %   Basophils Absolute 0.0 0.0 - 0.1 K/uL  Brain natriuretic peptide  Result Value Ref Range   B Natriuretic Peptide 33.0 0.0 - 100.0 pg/mL  Troponin I  Result Value Ref Range   Troponin I <0.03 <0.03 ng/mL    Laboratory interpretation all normal except hyperglycemia   EKG  EKG Interpretation  Date/Time:  Saturday February 23 2018 00:00:23 EST Ventricular Rate:  95 PR Interval:    QRS Duration: 96 QT Interval:  364 QTC Calculation: 458 R Axis:   59 Text Interpretation:  Sinus rhythm Consider right atrial enlargement Since last tracing rate faster 22 Feb 2017  Confirmed by Rolland Porter [phone]) on 02/23/2018 12:32:11 AM       Radiology Dg Chest Port 1 View  Result Date: 02/22/2018 CLINICAL DATA:  Cough wheeze and dyspnea EXAM: PORTABLE CHEST 1 VIEW COMPARISON:  02/17/2018 FINDINGS: Mild cardiac enlargement with central vascular congestion. No effusion or pneumothorax. No acute osseous abnormality. IMPRESSION: Mild cardiac enlargement with central vascular congestion since prior. No acute pneumonic consolidation. Electronically Signed   By: Ashley Royalty M.D.   On: 02/22/2018 23:52    Procedures Procedures (including critical care time)  Medications Ordered in ED Medications  methylPREDNISolone sodium succinate (SOLU-MEDROL) 125 mg/2 mL injection 125 mg (125 mg Intravenous Given 02/22/18 2354)  albuterol (PROVENTIL,VENTOLIN) solution continuous neb (10 mg/hr Nebulization Given 02/22/18 2336)  ipratropium (ATROVENT) nebulizer solution 0.5 mg (0.5 mg Nebulization Given 02/22/18 2336)     Initial Impression / Assessment and Plan / ED Course  I have reviewed the triage vital signs and the nursing notes.  Pertinent labs & imaging results that were available during my care of the patient were reviewed by me and considered in my medical decision making (see chart for details).     Patient was started on a continuous nebulizer of albuterol with Atrovent, she also was given IV Solu-Medrol.  Portable chest x-ray was ordered laboratory testing was done.  When I look at patient's pill bottle she has too many dexamethasone pills left over.  She does admit she has not been taking them.  There were only 3 missing.  And that was from 6 days ago.  Patient's chest x-ray was suggestive of may be a component of congestive heart failure however her BNP is totally normal.  Even though she does not have a pneumonia she probably has bronchitis and patient is a smoker, she will be discharged home with antibiotics.  Recheck at 1:18 AM patient states she is feeling much improved  now.  She is no longer having the coughing spasms that she had when I first saw her.  When I listen to her lungs now her lungs are clear and it does not initiate coughing spasms to breathe deep.  We discussed her dexamethasone.  She states she did not know what it was for so she did not take it.  She feels ready to be discharged.  Nursing staff ambulated patient and her pulse ox remained above 95%.    Final Clinical Impressions(s) / ED Diagnoses   Final diagnoses:  COPD exacerbation ([place])  Bronchitis    ED Discharge Orders        Ordered    predniSONE (DELTASONE) 20 MG tablet     02/23/18 0153    doxycycline (VIBRAMYCIN) 100 MG capsule  2 times daily     02/23/18 0153      Plan discharge  Rolland Porter, MD, Barbette Or, MD 02/23/18 [phone]

## 2018-02-23 NOTE — Discharge Instructions (Signed)
Continue using your inhaler and nebulizer's. Finish the dexamethasone or take the prednisone until gone. Take the antibiotic until gone. Recheck if you get worse again. Continue to try to stop smoking. Return if you get worse.

## 2018-02-23 NOTE — ED Notes (Signed)
Pt ambulated in hallway with steady gait and 1 assist. Pt oxygen saturation was 93% when sitting up in bead and with ambulating pt oxygen saturation was 93-95%. Pt denied dizziness with ambulating. Pt stated she "felt a little weaker than usual"

## 2018-02-28 ENCOUNTER — Encounter (HOSPITAL_COMMUNITY): Payer: Self-pay | Admitting: Emergency Medicine

## 2018-02-28 ENCOUNTER — Other Ambulatory Visit: Payer: Self-pay

## 2018-02-28 ENCOUNTER — Emergency Department (HOSPITAL_COMMUNITY)
Admission: EM | Admit: 2018-02-28 | Discharge: 2018-02-28 | Disposition: A | Discharge status: Home / Self Care | Payer: Medicare Other | Attending: Emergency Medicine | Admitting: Emergency Medicine

## 2018-02-28 DIAGNOSIS — R1012 Left upper quadrant pain: Secondary | ICD-10-CM | POA: Insufficient documentation

## 2018-02-28 DIAGNOSIS — R197 Diarrhea, unspecified: Secondary | ICD-10-CM | POA: Diagnosis not present

## 2018-02-28 DIAGNOSIS — Z5321 Procedure and treatment not carried out due to patient leaving prior to being seen by health care provider: Secondary | ICD-10-CM | POA: Insufficient documentation

## 2018-02-28 DIAGNOSIS — R112 Nausea with vomiting, unspecified: Secondary | ICD-10-CM | POA: Insufficient documentation

## 2018-02-28 LAB — COMPREHENSIVE METABOLIC PANEL
ALBUMIN: 3.2 g/dL — AB (ref 3.5–5.0)
ALT: 18 U/L (ref 14–54)
ANION GAP: 12 (ref 5–15)
AST: 16 U/L (ref 15–41)
Alkaline Phosphatase: 82 U/L (ref 38–126)
BILIRUBIN TOTAL: 0.6 mg/dL (ref 0.3–1.2)
BUN: 11 mg/dL (ref 6–20)
CALCIUM: 9.1 mg/dL (ref 8.9–10.3)
CO2: 26 mmol/L (ref 22–32)
Chloride: 105 mmol/L (ref 101–111)
Creatinine, Ser: 0.95 mg/dL (ref 0.44–1.00)
GLUCOSE: 123 mg/dL — AB (ref 65–99)
POTASSIUM: 3.2 mmol/L — AB (ref 3.5–5.1)
Sodium: 143 mmol/L (ref 135–145)
TOTAL PROTEIN: 6.3 g/dL — AB (ref 6.5–8.1)

## 2018-02-28 LAB — LIPASE, BLOOD: Lipase: 33 U/L (ref 11–51)

## 2018-02-28 LAB — URINALYSIS, ROUTINE W REFLEX MICROSCOPIC
BILIRUBIN URINE: NEGATIVE
Glucose, UA: NEGATIVE mg/dL
HGB URINE DIPSTICK: NEGATIVE
Ketones, ur: NEGATIVE mg/dL
LEUKOCYTES UA: NEGATIVE
NITRITE: NEGATIVE
PROTEIN: 30 mg/dL — AB
Specific Gravity, Urine: 1.029 (ref 1.005–1.030)
pH: 5 (ref 5.0–8.0)

## 2018-02-28 LAB — CBC
HEMATOCRIT: 45.6 % (ref 36.0–46.0)
HEMOGLOBIN: 13.9 g/dL (ref 12.0–15.0)
MCH: 27.4 pg (ref 26.0–34.0)
MCHC: 30.5 g/dL (ref 30.0–36.0)
MCV: 89.9 fL (ref 78.0–100.0)
Platelets: 431 10*3/uL — ABNORMAL HIGH (ref 150–400)
RBC: 5.07 MIL/uL (ref 3.87–5.11)
RDW: 14.9 % (ref 11.5–15.5)
WBC: 8.3 10*3/uL (ref 4.0–10.5)

## 2018-02-28 NOTE — ED Triage Notes (Signed)
Pt c/o of abdominal pain to LUQ with n/v/d when eating foodx 3 days. Pt states "I get this feeling of acid reflux in my stomach"

## 2018-02-28 NOTE — ED Notes (Signed)
Pt no longer in waiting room 

## 2018-02-28 NOTE — ED Notes (Signed)
Called for pt, no response

## 2018-03-01 ENCOUNTER — Encounter (HOSPITAL_COMMUNITY): Payer: Self-pay | Admitting: Emergency Medicine

## 2018-03-01 ENCOUNTER — Emergency Department (HOSPITAL_COMMUNITY): Payer: Medicare Other

## 2018-03-01 ENCOUNTER — Other Ambulatory Visit: Payer: Self-pay

## 2018-03-01 ENCOUNTER — Emergency Department (HOSPITAL_COMMUNITY)
Admission: EM | Admit: 2018-03-01 | Discharge: 2018-03-01 | Disposition: A | Discharge status: Home / Self Care | Payer: Medicare Other | Attending: Emergency Medicine | Admitting: Emergency Medicine

## 2018-03-01 DIAGNOSIS — K449 Diaphragmatic hernia without obstruction or gangrene: Secondary | ICD-10-CM | POA: Diagnosis not present

## 2018-03-01 DIAGNOSIS — Z79899 Other long term (current) drug therapy: Secondary | ICD-10-CM | POA: Insufficient documentation

## 2018-03-01 DIAGNOSIS — R05 Cough: Secondary | ICD-10-CM | POA: Diagnosis not present

## 2018-03-01 DIAGNOSIS — J40 Bronchitis, not specified as acute or chronic: Secondary | ICD-10-CM | POA: Diagnosis not present

## 2018-03-01 DIAGNOSIS — R079 Chest pain, unspecified: Secondary | ICD-10-CM | POA: Diagnosis not present

## 2018-03-01 DIAGNOSIS — R1012 Left upper quadrant pain: Secondary | ICD-10-CM | POA: Insufficient documentation

## 2018-03-01 DIAGNOSIS — R197 Diarrhea, unspecified: Secondary | ICD-10-CM | POA: Insufficient documentation

## 2018-03-01 DIAGNOSIS — R110 Nausea: Secondary | ICD-10-CM | POA: Diagnosis not present

## 2018-03-01 DIAGNOSIS — Z720 Tobacco use: Secondary | ICD-10-CM | POA: Diagnosis not present

## 2018-03-01 DIAGNOSIS — I10 Essential (primary) hypertension: Secondary | ICD-10-CM | POA: Diagnosis not present

## 2018-03-01 DIAGNOSIS — R1032 Left lower quadrant pain: Secondary | ICD-10-CM | POA: Diagnosis not present

## 2018-03-01 DIAGNOSIS — F17210 Nicotine dependence, cigarettes, uncomplicated: Secondary | ICD-10-CM | POA: Diagnosis not present

## 2018-03-01 DIAGNOSIS — J449 Chronic obstructive pulmonary disease, unspecified: Secondary | ICD-10-CM | POA: Diagnosis not present

## 2018-03-01 HISTORY — DX: Constipation, unspecified: K59.00

## 2018-03-01 LAB — COMPREHENSIVE METABOLIC PANEL
ALT: 17 U/L (ref 14–54)
AST: 14 U/L — ABNORMAL LOW (ref 15–41)
Albumin: 3.3 g/dL — ABNORMAL LOW (ref 3.5–5.0)
Alkaline Phosphatase: 81 U/L (ref 38–126)
Anion gap: 11 (ref 5–15)
BILIRUBIN TOTAL: 0.6 mg/dL (ref 0.3–1.2)
BUN: 10 mg/dL (ref 6–20)
CHLORIDE: 108 mmol/L (ref 101–111)
CO2: 25 mmol/L (ref 22–32)
CREATININE: 0.81 mg/dL (ref 0.44–1.00)
Calcium: 9 mg/dL (ref 8.9–10.3)
GFR calc non Af Amer: >60 mL/min (ref >60)
Glucose, Bld: 111 mg/dL — ABNORMAL HIGH (ref 65–99)
POTASSIUM: 3.4 mmol/L — AB (ref 3.5–5.1)
Sodium: 144 mmol/L (ref 135–145)
TOTAL PROTEIN: 6.4 g/dL — AB (ref 6.5–8.1)

## 2018-03-01 LAB — CBC
HEMATOCRIT: 45.4 % (ref 36.0–46.0)
Hemoglobin: 13.8 g/dL (ref 12.0–15.0)
MCH: 27.1 pg (ref 26.0–34.0)
MCHC: 30.4 g/dL (ref 30.0–36.0)
MCV: 89 fL (ref 78.0–100.0)
PLATELETS: 392 10*3/uL (ref 150–400)
RBC: 5.1 MIL/uL (ref 3.87–5.11)
RDW: 14.6 % (ref 11.5–15.5)
WBC: 7.5 10*3/uL (ref 4.0–10.5)

## 2018-03-01 LAB — URINALYSIS, ROUTINE W REFLEX MICROSCOPIC
Bilirubin Urine: NEGATIVE
GLUCOSE, UA: NEGATIVE mg/dL
Hgb urine dipstick: NEGATIVE
KETONES UR: NEGATIVE mg/dL
LEUKOCYTES UA: NEGATIVE
NITRITE: NEGATIVE
PROTEIN: NEGATIVE mg/dL
Specific Gravity, Urine: 1.021 (ref 1.005–1.030)
pH: 5 (ref 5.0–8.0)

## 2018-03-01 LAB — LIPASE, BLOOD: Lipase: 35 U/L (ref 11–51)

## 2018-03-01 LAB — TROPONIN I: Troponin I: <0.03 ng/mL (ref <0.03)

## 2018-03-01 MED ORDER — FAMOTIDINE IN NACL 20-0.9 MG/50ML-% IV SOLN
20.0000 mg | Freq: Once | INTRAVENOUS | Status: AC
  Administered 2018-03-01: 20 mg via INTRAVENOUS
  Filled 2018-03-01: qty 50

## 2018-03-01 MED ORDER — IPRATROPIUM-ALBUTEROL 0.5-2.5 (3) MG/3ML IN SOLN
3.0000 mL | Freq: Once | RESPIRATORY_TRACT | Status: AC
  Administered 2018-03-01: 3 mL via RESPIRATORY_TRACT

## 2018-03-01 MED ORDER — FAMOTIDINE 20 MG PO TABS: 20.0000 mg | ORAL_TABLET | Freq: Two times a day (BID) | ORAL | 0 refills | Status: DC

## 2018-03-01 MED ORDER — GI COCKTAIL ~~LOC~~
30.0000 mL | Freq: Once | ORAL | Status: AC
  Administered 2018-03-01: 30 mL via ORAL
  Filled 2018-03-01: qty 30

## 2018-03-01 MED ORDER — SODIUM CHLORIDE 0.9 % IV BOLUS (SEPSIS)
1000.0000 mL | Freq: Once | INTRAVENOUS | Status: AC
  Administered 2018-03-01: 1000 mL via INTRAVENOUS

## 2018-03-01 MED ORDER — ALBUTEROL SULFATE HFA 108 (90 BASE) MCG/ACT IN AERS: 2.0000 | INHALATION_SPRAY | RESPIRATORY_TRACT | 0 refills | Status: DC | PRN

## 2018-03-01 MED ORDER — ONDANSETRON 4 MG PO TBDP: 4.0000 mg | ORAL_TABLET | Freq: Three times a day (TID) | ORAL | 0 refills | Status: DC | PRN

## 2018-03-01 MED ORDER — BENZONATATE 100 MG PO CAPS: 100.0000 mg | ORAL_CAPSULE | Freq: Three times a day (TID) | ORAL | 0 refills | Status: DC | PRN

## 2018-03-01 MED ORDER — IPRATROPIUM-ALBUTEROL 0.5-2.5 (3) MG/3ML IN SOLN
RESPIRATORY_TRACT | Status: AC
  Filled 2018-03-01: qty 3

## 2018-03-01 NOTE — ED Notes (Signed)
Pt complaining of upper abdominal pain for the past 2 days. Says she had diarrhea yesterday none today. Pain worse when she coughs.

## 2018-03-01 NOTE — ED Triage Notes (Signed)
Pt c/o LUQ abdominal pain with nausea and diarrhea x 3 days. States she came in yesterday and left before she was called to the back. States she was told to come here by Dr. Legrand Rams.

## 2018-03-01 NOTE — ED Provider Notes (Signed)
[facility] EMERGENCY DEPARTMENT Provider Note   CSN: [id number] Arrival date & time: 03/01/18  1646     History   Chief Complaint Chief Complaint  Patient presents with  . Diarrhea    HPI Virginia Washington is a 62 y.o. female.  HPI  Pt was seen at 2005.  Per pt, c/o gradual onset and persistence of constant LUQ abd "pain" for the past 3 days.  Has been associated with nausea and multiple intermittent episodes of diarrhea.  Describes the abd pain as "aching."  Pt states the pain increases with coughing, eating and palpation of the area.  Denies vomiting, no fevers, no back pain, no rash, no CP/SOB, no black or blood in stools. Pt was evaluated in the ED last week for cough, dx COPD exacerbation.    Past Medical History:  Diagnosis Date  . Chronic back pain   . Constipation   . COPD (chronic obstructive pulmonary disease) ([place])   . DVT (deep venous thrombosis) ([place])   . GERD (gastroesophageal reflux disease)   . Headache   . Hyperlipidemia   . Hypertension   . Normal cardiac stress test 12/2014    Patient Active Problem List   Diagnosis Date Noted  . Encounter for screening colonoscopy 01/16/2018  . Abdominal pain, chronic, generalized 11/16/2017  . Constipation 11/16/2017  . SBO (small bowel obstruction) ([place]) 08/16/2016  . Hyperlipidemia 08/16/2016  . Chest pain 12/12/2014  . Hypertension 12/12/2014  . Tobacco abuse 10/29/2011    Past Surgical History:  Procedure Laterality Date  . ABDOMINAL AORTOGRAM W/LOWER EXTREMITY N/A 10/26/2017   Procedure: ABDOMINAL AORTOGRAM W/LOWER EXTREMITY;  Surgeon: Elam Dutch, MD;  Location: [place] CV LAB;  Service: Cardiovascular;  Laterality: N/A;  . ABDOMINAL HYSTERECTOMY    . CHOLECYSTECTOMY    . COLONOSCOPY  2007   Dr. Laural Golden: small internal hemorrhoids, otherwise normal. exam could be compromised due to quality of prep  . COLONOSCOPY N/A 02/15/2018   Procedure: COLONOSCOPY;  Surgeon: Danie Binder, MD;  [facility];  Service: Endoscopy;  Laterality: N/A;  8:30am    OB History    Gravida Para Term Preterm AB Living             3   SAB TAB Ectopic Multiple Live Births                   Home Medications    Prior to Admission medications   Medication Sig Start Date End Date Taking? Authorizing Provider  doxycycline (VIBRAMYCIN) 100 MG capsule Take 1 capsule (100 mg total) by mouth 2 (two) times daily. 02/23/18  Yes Rolland Porter, MD  amLODipine (NORVASC) 5 MG tablet Take 5 mg by mouth daily. 09/11/16   [provider]  atorvastatin (LIPITOR) 20 MG tablet Take 1 tablet (20 mg total) by mouth daily. 12/23/14   Fay Records, MD  dexamethasone (DECADRON) 4 MG tablet Take 1 tablet (4 mg total) by mouth 2 (two) times daily with a meal. 02/17/18   Lily Kocher, PA-C  HYDROcodone-homatropine [facility]) 5-1.5 MG/5ML syrup Take 5 mLs by mouth every 6 (six) hours as needed. Patient not taking: Reported on 03/01/2018 02/17/18   Lily Kocher, PA-C  linaclotide [facility]) 290 MCG CAPS capsule Take 1 capsule (290 mcg total) by mouth daily before breakfast. Patient not taking: Reported on 01/24/2018 12/10/17   Rolland Porter, MD  loratadine-pseudoephedrine (CLARITIN-D 12 HOUR) 5-120 MG tablet Take 1 tablet by mouth 2 (two) times  daily. 02/17/18   Lily Kocher, PA-C  pantoprazole (PROTONIX) 40 MG tablet Take 40 mg daily by mouth. 09/18/17   [provider]  predniSONE (DELTASONE) 20 MG tablet Take 3 po QD x 3d , then 2 po QD x 3d then 1 po QD x 3d 02/23/18   Rolland Porter, MD    Family History Family History  Problem Relation Age of Onset  . Hypertension Mother   . Colon cancer Neg Hx   . Colon polyps Neg Hx     Social History Social History   Tobacco Use  . Smoking status: Current Every Day Smoker    Packs/day: 0.50    Years: 40.00    Pack years: 20.00    Types: Cigarettes  . Smokeless tobacco: Never Used  Substance Use Topics  . Alcohol use: No    Alcohol/week: 0.0 oz  . Drug use: No      Allergies   Patient has no known allergies.   Review of Systems Review of Systems ROS: Statement: All systems negative except as marked or noted in the HPI; Constitutional: Negative for fever and chills. ; ; Eyes: Negative for eye pain, redness and discharge. ; ; ENMT: Negative for ear pain, hoarseness, nasal congestion, sinus pressure and sore throat. ; ; Cardiovascular: Negative for chest pain, palpitations, diaphoresis, dyspnea and peripheral edema. ; ; Respiratory: Negative for cough, wheezing and stridor. ; ; Gastrointestinal: +nausea, diarrhea, abd pain. Negative for vomiting, blood in stool, hematemesis, jaundice and rectal bleeding. . ; ; Genitourinary: Negative for dysuria, flank pain and hematuria. ; ; Musculoskeletal: Negative for back pain and neck pain. Negative for swelling and trauma.; ; Skin: Negative for pruritus, rash, abrasions, blisters, bruising and skin lesion.; ; Neuro: Negative for headache, lightheadedness and neck stiffness. Negative for weakness, altered level of consciousness, altered mental status, extremity weakness, paresthesias, involuntary movement, seizure and syncope.     Physical Exam Updated Vital Signs BP 138/80   Pulse 77   Temp 99.3 F (37.4 C) (Oral)   Resp 11   Ht 5\' 2"  (1.575 m)   Wt 59 kg (130 lb)   SpO2 100%   BMI 23.78 kg/m   Physical Exam 2010: Physical examination:  Nursing notes reviewed; Vital signs and O2 SAT reviewed;  Constitutional: Well developed, Well nourished, Well hydrated, In no acute distress; Head:  Normocephalic, atraumatic; Eyes: EOMI, PERRL, No scleral icterus; ENMT: Mouth and pharynx normal, Mucous membranes moist; Neck: Supple, Full range of motion, No lymphadenopathy; Cardiovascular: Regular rate and rhythm, No gallop; Respiratory: Breath sounds coarse & equal bilaterally, No wheezes.  Speaking full sentences with ease, Normal respiratory effort/excursion; Chest: Nontender, Movement normal; Abdomen: Soft, +LUQ >  mid-epigastric areas tender to palp. No rebound or guarding. No rash. Nondistended, Normal bowel sounds; Genitourinary: No CVA tenderness; Extremities: Peripheral pulses normal, No tenderness, No edema, No calf edema or asymmetry.; Neuro: AA&Ox3, Major CN grossly intact.  Speech clear. No gross focal motor or sensory deficits in extremities. Climbs on and off stretcher easily by herself. Gait steady..; Skin: Color normal, Warm, Dry.   ED Treatments / Results  Labs (all labs ordered are listed, but only abnormal results are displayed)   EKG  EKG Interpretation  Date/Time:  Friday March 01 2018 20:22:09 EDT Ventricular Rate:  80 PR Interval:    QRS Duration: 91 QT Interval:  375 QTC Calculation: 433 R Axis:   49 Text Interpretation:  Sinus rhythm Baseline wander Artifact When compared with ECG of 02/23/2018  No significant change was found Confirmed by Francine Graven [phone]) on 03/01/2018 8:47:33 PM       Radiology   Procedures Procedures (including critical care time)  Medications Ordered in ED Medications  ipratropium-albuterol (DUONEB) 0.5-2.5 (3) MG/3ML nebulizer solution (  Canceled Entry 03/01/18 2037)  sodium chloride 0.9 % bolus 1,000 mL (0 mLs Intravenous Stopped 03/01/18 2136)  ipratropium-albuterol (DUONEB) 0.5-2.5 (3) MG/3ML nebulizer solution 3 mL (3 mLs Nebulization Given 03/01/18 2018)  famotidine (PEPCID) IVPB 20 mg premix (0 mg Intravenous Stopped 03/01/18 2101)  gi cocktail (Maalox,Lidocaine,Donnatal) (30 mLs Oral Given 03/01/18 2036)     Initial Impression / Assessment and Plan / ED Course  I have reviewed the triage vital signs and the nursing notes.  Pertinent labs & imaging results that were available during my care of the patient were reviewed by me and considered in my medical decision making (see chart for details).  MDM Reviewed: previous chart, nursing note and vitals Reviewed previous: labs and ECG Interpretation: labs, ECG, x-ray and CT  scan   Results for orders placed or performed during the hospital encounter of 03/01/18  Lipase, blood  Result Value Ref Range   Lipase 35 11 - 51 U/L  Comprehensive metabolic panel  Result Value Ref Range   Sodium 144 135 - 145 mmol/L   Potassium 3.4 (L) 3.5 - 5.1 mmol/L   Chloride 108 101 - 111 mmol/L   CO2 25 22 - 32 mmol/L   Glucose, Bld 111 (H) 65 - 99 mg/dL   BUN 10 6 - 20 mg/dL   Creatinine, Ser 0.81 0.44 - 1.00 mg/dL   Calcium 9.0 8.9 - 10.3 mg/dL   Total Protein 6.4 (L) 6.5 - 8.1 g/dL   Albumin 3.3 (L) 3.5 - 5.0 g/dL   AST 14 (L) 15 - 41 U/L   ALT 17 14 - 54 U/L   Alkaline Phosphatase 81 38 - 126 U/L   Total Bilirubin 0.6 0.3 - 1.2 mg/dL   GFR calc non Af Amer >60 >60 mL/min   GFR calc Af Amer >60 >60 mL/min   Anion gap 11 5 - 15  CBC  Result Value Ref Range   WBC 7.5 4.0 - 10.5 K/uL   RBC 5.10 3.87 - 5.11 MIL/uL   Hemoglobin 13.8 12.0 - 15.0 g/dL   HCT 45.4 36.0 - 46.0 %   MCV 89.0 78.0 - 100.0 fL   MCH 27.1 26.0 - 34.0 pg   MCHC 30.4 30.0 - 36.0 g/dL   RDW 14.6 11.5 - 15.5 %   Platelets 392 150 - 400 K/uL  Urinalysis, Routine w reflex microscopic  Result Value Ref Range   Color, Urine YELLOW YELLOW   APPearance CLOUDY (A) CLEAR   Specific Gravity, Urine 1.021 1.005 - 1.030   pH 5.0 5.0 - 8.0   Glucose, UA NEGATIVE NEGATIVE mg/dL   Hgb urine dipstick NEGATIVE NEGATIVE   Bilirubin Urine NEGATIVE NEGATIVE   Ketones, ur NEGATIVE NEGATIVE mg/dL   Protein, ur NEGATIVE NEGATIVE mg/dL   Nitrite NEGATIVE NEGATIVE   Leukocytes, UA NEGATIVE NEGATIVE  Troponin I  Result Value Ref Range   Troponin I <0.03 <0.03 ng/mL   Ct Abdomen Pelvis Wo Contrast Result Date: 03/01/2018 CLINICAL DATA:  Left upper quadrant abdominal pain, nausea and diarrhea. EXAM: CT ABDOMEN AND PELVIS WITHOUT CONTRAST TECHNIQUE: Multidetector CT imaging of the abdomen and pelvis was performed following the standard protocol without IV contrast. COMPARISON:  10/26/2017 CT abdomen/pelvis.  FINDINGS: Lower chest:  No significant pulmonary nodules or acute consolidative airspace disease. Hepatobiliary: Normal liver size. No liver mass. Cholecystectomy. No biliary ductal dilatation. Pancreas: Normal, with no mass or duct dilation. Spleen: Normal size. No mass. Adrenals/Urinary Tract: Normal adrenals. No renal stones. No hydronephrosis. No contour deforming renal mass. Normal bladder. Stomach/Bowel: Small hiatal hernia. Otherwise normal stomach. Normal caliber small bowel with no small bowel wall thickening. Normal appendix. Normal large bowel with no diverticulosis, large bowel wall thickening or pericolonic fat stranding. Vascular/Lymphatic: Atherosclerotic nonaneurysmal abdominal aorta. No pathologically enlarged lymph nodes in the abdomen or pelvis. Reproductive: Status post hysterectomy, with no abnormal findings at the vaginal cuff. No adnexal mass. Other: No pneumoperitoneum, ascites or focal fluid collection. Musculoskeletal: No aggressive appearing focal osseous lesions. IMPRESSION: 1. No acute abnormality. No evidence of bowel obstruction or acute bowel inflammation. Normal appendix. 2. Small hiatal hernia. 3.  Aortic Atherosclerosis (ICD10-I70.0). Electronically Signed   By: Ilona Sorrel M.D.   On: 03/01/2018 21:15   Dg Chest 2 View Result Date: 03/01/2018 CLINICAL DATA:  62 year old female with history of cough and upper chest pain. Congestion for the past 2 days. EXAM: CHEST - 2 VIEW COMPARISON:  Chest x-ray 02/22/2018. FINDINGS: Diffuse peribronchial cuffing. Lung volumes are normal. No consolidative airspace disease. No pleural effusions. No pneumothorax. No pulmonary nodule or mass noted. Pulmonary vasculature and the cardiomediastinal silhouette are within normal limits. IMPRESSION: 1. Diffuse peribronchial cuffing, concerning for an acute bronchitis. Electronically Signed   By: Vinnie Langton M.D.   On: 03/01/2018 21:18    2230:  Workup reassuring. Pt has tol PO well while in the  ED without N/V.  No stooling while in the ED.  Abd benign, VSS. Feels better after meds and wants to go home now. Pt states she "isn't sure" if she has an albuterol inhaler; will rx. Tx symptomatically at this time. Dx and testing d/w pt.  Questions answered.  Verb understanding, agreeable to d/c home with outpatient f/u.     Final Clinical Impressions(s) / ED Diagnoses   Final diagnoses:  None    ED Discharge Orders    None       Francine Graven, DO 03/03/18 1722

## 2018-03-01 NOTE — ED Notes (Signed)
Patient verbalized discharge instructions, stated grandson was here to take her home

## 2018-03-01 NOTE — Discharge Instructions (Signed)
Take the prescriptions as directed.  Use your albuterol inhaler (2 to 4 puffs) every 4 hours for the next 7 days, then as needed for cough, wheezing, or shortness of breath. Eat a bland diet, avoiding greasy, fatty, fried foods, as well as spicy and acidic foods or beverages.  Avoid eating within 2 to 3 hours before going to bed or laying down.  Also avoid teas, colas, coffee, chocolate, pepermint and spearment.  May also take over the counter maalox/mylanta, as directed on packaging, as needed for discomfort. Call your regular medical doctor on Monday to schedule a follow up appointment in the next 3 days. Call the GI doctor on Monday to schedule a follow up appointment within the next week.  Return to the Emergency Department immediately if worsening.

## 2018-03-04 ENCOUNTER — Other Ambulatory Visit: Payer: Self-pay | Admitting: *Deleted

## 2018-03-04 NOTE — Patient Outreach (Signed)
[place] [facility]) Care Management  03/04/2018  DEVRI KREHER 1956/01/16 [id number]   RN Health Coach attempted #1 screening  outreach call to patient.  Patient was unavailable. HIPPA compliance voicemail message left with return callback number.  Plan: RN will call patient again within 10 business days.  [place] Care Management [phone]

## 2018-03-05 ENCOUNTER — Other Ambulatory Visit: Payer: Self-pay | Admitting: *Deleted

## 2018-03-05 NOTE — Patient Outreach (Signed)
[facility]) Care Management  03/05/2018  Virginia Washington Dec 30, 1955 [id number]   RN Health Coach attempted #2 screening outreach call to patient.  Patient was unavailable. HIPPA compliance voicemail message left with return callback number.  Plan: RN will call patient again within 10 business days. Unsuccessful outreach letter sent  [place] Management [phone]

## 2018-03-19 ENCOUNTER — Other Ambulatory Visit: Payer: Self-pay | Admitting: *Deleted

## 2018-03-19 NOTE — Patient Outreach (Signed)
[facility]) Care Management  03/19/2018  Virginia Washington 1956/10/18 [id number]   RN Health Coach attempted#2  follow up outreach call to patient.  Patient was unavailable. HIPPA compliance voicemail message left with return callback number.  Plan: RN sent Unsuccessful outreach letter to patient RN will call patient again within 10 days.  [place] Care Management [phone]

## 2018-03-28 ENCOUNTER — Ambulatory Visit: Payer: Self-pay | Admitting: *Deleted

## 2018-04-03 ENCOUNTER — Encounter: Payer: Self-pay | Admitting: *Deleted

## 2018-04-11 DIAGNOSIS — F17200 Nicotine dependence, unspecified, uncomplicated: Secondary | ICD-10-CM | POA: Diagnosis not present

## 2018-04-11 DIAGNOSIS — E785 Hyperlipidemia, unspecified: Secondary | ICD-10-CM | POA: Diagnosis not present

## 2018-04-11 DIAGNOSIS — F17210 Nicotine dependence, cigarettes, uncomplicated: Secondary | ICD-10-CM | POA: Diagnosis not present

## 2018-04-11 DIAGNOSIS — J449 Chronic obstructive pulmonary disease, unspecified: Secondary | ICD-10-CM | POA: Diagnosis not present

## 2018-04-11 DIAGNOSIS — R69 Illness, unspecified: Secondary | ICD-10-CM | POA: Diagnosis not present

## 2018-04-11 DIAGNOSIS — I10 Essential (primary) hypertension: Secondary | ICD-10-CM | POA: Diagnosis not present

## 2018-04-15 ENCOUNTER — Encounter: Payer: Self-pay | Admitting: Gastroenterology

## 2018-04-15 ENCOUNTER — Other Ambulatory Visit: Payer: Self-pay

## 2018-04-15 ENCOUNTER — Ambulatory Visit (INDEPENDENT_AMBULATORY_CARE_PROVIDER_SITE_OTHER): Payer: Medicare HMO | Admitting: Gastroenterology

## 2018-04-15 VITALS — BP 117/73 | HR 79 | Temp 97.6°F | Ht 62.0 in | Wt 133.2 lb

## 2018-04-15 DIAGNOSIS — K5900 Constipation, unspecified: Secondary | ICD-10-CM | POA: Diagnosis not present

## 2018-04-15 DIAGNOSIS — R109 Unspecified abdominal pain: Secondary | ICD-10-CM

## 2018-04-15 NOTE — Patient Instructions (Signed)
I would like to schedule an upper endoscopy with Dr. Oneida Alar in the near future. If this is completely normal, you may need a specialized CT of your abdomen.   Continue Linzess once each morning, 30 minutes before breakfast.   We will see you in 3 months!  It was a pleasure to see you today. I strive to create trusting relationships with patients to provide genuine, compassionate, and quality care. I value your feedback. If you receive a survey regarding your visit,  I greatly appreciate you taking time to fill this out.   Annitta Needs, PhD, ANP-BC [facility]

## 2018-04-15 NOTE — Progress Notes (Addendum)
EGD/POSSIBLE PYLORIC DILATION MAY 23.  Referring Provider: [name], MD Primary Care Physician:  [name], MD  Primary GI: Dr. Oneida Alar   Chief Complaint  Patient presents with  . Abdominal Pain    lower abd, happens with food x 1 week    HPI:   Virginia Washington is a 62 y.o. female presenting today with a history of abdominal pain, last seen in Jan 2019. In interim from appointment, she underwent colonoscopy with 2 simple adenomas, external and internal hemorrhoids. Next colonoscopy 2024-2026. She has had multiple CT scans for abdominal pain over the years. History of SBO in Aug 2017 conservatively managed. Most recently underwent abdominal aortogram with lower extremity runoff attempted left iliac recanalization; Vascular discussed with the patient possible femoral-femoral bypass versus continued conservative management with a walking program. Abdominal pain was improved at last visit when taking Linzess 290 mcg daily. Her weight is stable from last appt and actually improved. She had a CT without contrast March 2019 without obvious acute abnormalities.   Was seen in ED March 2019 with  abdominal pain. No LUQ pain. Denies nausea. Pain comes and goes. Hurts occasionally with eating. "just certain foods". Ate lasagna and made it hurt. No dysphagia. Good appetite. Denies reflux, heartburn. Linzess helps with good bowel movements. BM daily. LLQ pain improved after having a BM. Thinks she may be too full. Feels like her food isn't going all the way down. Just "staying up" and points to upper abdomen. States stomach is also full upper abdomen with anything that she eats. Will take linzess and feel better. No NSAIDs. Feels full off of a few bites of food at times.   CT abd/pelvis without contrast March 01, 2018: no acute abnormality.   Past Medical History:  Diagnosis Date  . Chronic back pain   . Constipation   . COPD (chronic obstructive pulmonary disease) ([place])   . DVT (deep  venous thrombosis) ([place])   . GERD (gastroesophageal reflux disease)   . Headache   . Hyperlipidemia   . Hypertension   . Normal cardiac stress test 12/2014    Past Surgical History:  Procedure Laterality Date  . ABDOMINAL AORTOGRAM W/LOWER EXTREMITY N/A 10/26/2017   Procedure: ABDOMINAL AORTOGRAM W/LOWER EXTREMITY;  Surgeon: Elam Dutch, MD;  Location: [facility];  Service: Cardiovascular;  Laterality: N/A;  . ABDOMINAL HYSTERECTOMY    . CHOLECYSTECTOMY    . COLONOSCOPY  2007   Dr. Laural Golden: small internal hemorrhoids, otherwise normal. exam could be compromised due to quality of prep  . COLONOSCOPY N/A 02/15/2018   Dr. Oneida Alar: 2 simple adenomas, external and internal hemorrhoids. Next colonoscopy 2024-2026    Current Outpatient Medications  Medication Sig Dispense Refill  . albuterol (PROVENTIL HFA;VENTOLIN HFA) 108 (90 Base) MCG/ACT inhaler Inhale 2 puffs into the lungs every 4 (four) hours as needed for wheezing or shortness of breath. 1 Inhaler 0  . amLODipine (NORVASC) 5 MG tablet Take 5 mg by mouth daily.    Marland Kitchen atorvastatin (LIPITOR) 20 MG tablet Take 1 tablet (20 mg total) by mouth daily. 90 tablet 3  . dexamethasone (DECADRON) 4 MG tablet Take 1 tablet (4 mg total) by mouth 2 (two) times daily with a meal. 10 tablet 0  . linaclotide (LINZESS) 290 MCG CAPS capsule Take 1 capsule (290 mcg total) by mouth daily before breakfast. 30 capsule 0   No current facility-administered medications for this visit.     Allergies as of 04/15/2018  . (  No Known Allergies)    Family History  Problem Relation Age of Onset  . Hypertension Mother   . Colon cancer Neg Hx   . Colon polyps Neg Hx     Social History   Socioeconomic History  . Marital status: Widowed    Spouse name: Not on file  . Number of children: Not on file  . Years of education: Not on file  . Highest education level: Not on file  Occupational History  . Occupation: unemployed  Social Needs  . Financial  resource strain: Not on file  . Food insecurity:    Worry: Not on file    Inability: Not on file  . Transportation needs:    Medical: Not on file    Non-medical: Not on file  Tobacco Use  . Smoking status: Current Every Day Smoker    Packs/day: 0.50    Years: 40.00    Pack years: 20.00    Types: Cigarettes  . Smokeless tobacco: Never Used  Substance and Sexual Activity  . Alcohol use: No    Alcohol/week: 0.0 oz  . Drug use: No  . Sexual activity: Never  Lifestyle  . Physical activity:    Days per week: Not on file    Minutes per session: Not on file  . Stress: Not on file  Relationships  . Social connections:    Talks on phone: Not on file    Gets together: Not on file    Attends religious service: Not on file    Active member of club or organization: Not on file    Attends meetings of clubs or organizations: Not on file    Relationship status: Not on file  Other Topics Concern  . Not on file  Social History Narrative  . Not on file    Review of Systems: Gen: see HPI  CV: Denies chest pain, palpitations, syncope, peripheral edema, and claudication. Resp: Denies dyspnea at rest, cough, wheezing, coughing up blood, and pleurisy. GI: see HPI  Derm: Denies rash, itching, dry skin Psych: Denies depression, anxiety, memory loss, confusion. No homicidal or suicidal ideation.  Heme: Denies bruising, bleeding, and enlarged lymph nodes.  Physical Exam: BP 117/73   Pulse 79   Temp 97.6 F (36.4 C) (Oral)   Ht 5\' 2"  (1.575 m)   Wt 133 lb 3.2 oz (60.4 kg)   BMI 24.36 kg/m  General:   Alert and oriented. No distress noted. Pleasant and cooperative.  Head:  Normocephalic and atraumatic. Eyes:  Conjuctiva clear without scleral icterus. Mouth:  Oral mucosa pink and moist. Abdomen:  +BS, soft, mild TTP upper abdomen and non-distended. No rebound or guarding. No HSM or masses noted. Msk:  Symmetrical without gross deformities. Normal posture. Extremities:  Without  edema. Neurologic:  Alert and  oriented x4 Psych:  Alert and cooperative. Normal mood and affect.  Lab Results  Component Value Date   WBC 7.5 03/01/2018   HGB 13.8 03/01/2018   HCT 45.4 03/01/2018   MCV 89.0 03/01/2018   PLT 392 03/01/2018   Lab Results  Component Value Date   ALT 17 03/01/2018   AST 14 (L) 03/01/2018   ALKPHOS 81 03/01/2018   BILITOT 0.6 03/01/2018

## 2018-04-16 NOTE — Assessment & Plan Note (Signed)
62 year old female with history of chronic abdominal pain, with lower abdominal pain  improved since starting Linzess 290 mcg daily. However, she is now staating upper abdominal pain intermittently, worsening with eating, noting early satiety and fullness sensation after eating. Denies any NSAIDs, typical reflux, or dysphagia. Gallbladder absent. No prior EGD that she is aware.   Proceed with upper endoscopy in the near future with Dr. Oneida Alar. The risks, benefits, and alternatives have been discussed in detail with patient. They have stated understanding and desire to proceed.  If EGD is negative and any signs of weight loss, would pursue CTA to assess vasculature, as she has known atherosclerotic disease. However, she has actually gained weight. Hold off on PPI until EGD is completed.  Return in 3 months.

## 2018-04-17 NOTE — Progress Notes (Signed)
cc'ed to pcp °

## 2018-04-27 ENCOUNTER — Emergency Department (HOSPITAL_COMMUNITY): Payer: Medicare HMO

## 2018-04-27 ENCOUNTER — Inpatient Hospital Stay (HOSPITAL_COMMUNITY)
Admission: EM | Admit: 2018-04-27 | Discharge: 2018-04-30 | DRG: 388 | Disposition: A | Discharge status: Home / Self Care | Payer: Medicare HMO | Attending: Internal Medicine | Admitting: Internal Medicine

## 2018-04-27 DIAGNOSIS — K529 Noninfective gastroenteritis and colitis, unspecified: Secondary | ICD-10-CM | POA: Diagnosis not present

## 2018-04-27 DIAGNOSIS — K56609 Unspecified intestinal obstruction, unspecified as to partial versus complete obstruction: Secondary | ICD-10-CM | POA: Diagnosis present

## 2018-04-27 DIAGNOSIS — Z90710 Acquired absence of both cervix and uterus: Secondary | ICD-10-CM

## 2018-04-27 DIAGNOSIS — K56600 Partial intestinal obstruction, unspecified as to cause: Secondary | ICD-10-CM | POA: Diagnosis not present

## 2018-04-27 DIAGNOSIS — F17210 Nicotine dependence, cigarettes, uncomplicated: Secondary | ICD-10-CM | POA: Diagnosis present

## 2018-04-27 DIAGNOSIS — R0600 Dyspnea, unspecified: Secondary | ICD-10-CM

## 2018-04-27 DIAGNOSIS — Z7952 Long term (current) use of systemic steroids: Secondary | ICD-10-CM

## 2018-04-27 DIAGNOSIS — Z720 Tobacco use: Secondary | ICD-10-CM | POA: Diagnosis not present

## 2018-04-27 DIAGNOSIS — R911 Solitary pulmonary nodule: Secondary | ICD-10-CM | POA: Diagnosis present

## 2018-04-27 DIAGNOSIS — Z79899 Other long term (current) drug therapy: Secondary | ICD-10-CM

## 2018-04-27 DIAGNOSIS — K56699 Other intestinal obstruction unspecified as to partial versus complete obstruction: Secondary | ICD-10-CM | POA: Diagnosis not present

## 2018-04-27 DIAGNOSIS — E870 Hyperosmolality and hypernatremia: Secondary | ICD-10-CM | POA: Diagnosis not present

## 2018-04-27 DIAGNOSIS — E785 Hyperlipidemia, unspecified: Secondary | ICD-10-CM | POA: Diagnosis present

## 2018-04-27 DIAGNOSIS — K449 Diaphragmatic hernia without obstruction or gangrene: Secondary | ICD-10-CM | POA: Diagnosis not present

## 2018-04-27 DIAGNOSIS — I10 Essential (primary) hypertension: Secondary | ICD-10-CM | POA: Diagnosis present

## 2018-04-27 DIAGNOSIS — Z86718 Personal history of other venous thrombosis and embolism: Secondary | ICD-10-CM

## 2018-04-27 DIAGNOSIS — K219 Gastro-esophageal reflux disease without esophagitis: Secondary | ICD-10-CM | POA: Diagnosis present

## 2018-04-27 DIAGNOSIS — K769 Liver disease, unspecified: Secondary | ICD-10-CM | POA: Diagnosis not present

## 2018-04-27 DIAGNOSIS — E782 Mixed hyperlipidemia: Secondary | ICD-10-CM | POA: Diagnosis not present

## 2018-04-27 DIAGNOSIS — R109 Unspecified abdominal pain: Secondary | ICD-10-CM | POA: Diagnosis not present

## 2018-04-27 DIAGNOSIS — K5909 Other constipation: Secondary | ICD-10-CM | POA: Diagnosis present

## 2018-04-27 DIAGNOSIS — J440 Chronic obstructive pulmonary disease with acute lower respiratory infection: Secondary | ICD-10-CM | POA: Diagnosis present

## 2018-04-27 DIAGNOSIS — G8929 Other chronic pain: Secondary | ICD-10-CM | POA: Diagnosis present

## 2018-04-27 DIAGNOSIS — J9601 Acute respiratory failure with hypoxia: Secondary | ICD-10-CM | POA: Diagnosis present

## 2018-04-27 DIAGNOSIS — J189 Pneumonia, unspecified organism: Secondary | ICD-10-CM | POA: Diagnosis present

## 2018-04-27 DIAGNOSIS — Z8249 Family history of ischemic heart disease and other diseases of the circulatory system: Secondary | ICD-10-CM

## 2018-04-27 DIAGNOSIS — K5900 Constipation, unspecified: Secondary | ICD-10-CM | POA: Diagnosis present

## 2018-04-27 DIAGNOSIS — R6881 Early satiety: Secondary | ICD-10-CM | POA: Diagnosis present

## 2018-04-27 DIAGNOSIS — E876 Hypokalemia: Secondary | ICD-10-CM | POA: Diagnosis present

## 2018-04-27 DIAGNOSIS — N281 Cyst of kidney, acquired: Secondary | ICD-10-CM | POA: Diagnosis not present

## 2018-04-27 LAB — CBC WITH DIFFERENTIAL/PLATELET
Basophils Absolute: 0 10*3/uL (ref 0.0–0.1)
Basophils Relative: 0 %
EOS ABS: 0 10*3/uL (ref 0.0–0.7)
Eosinophils Relative: 0 %
HEMATOCRIT: 47.6 % — AB (ref 36.0–46.0)
HEMOGLOBIN: 15.3 g/dL — AB (ref 12.0–15.0)
LYMPHS ABS: 2.9 10*3/uL (ref 0.7–4.0)
LYMPHS PCT: 24 %
MCH: 28.5 pg (ref 26.0–34.0)
MCHC: 32.1 g/dL (ref 30.0–36.0)
MCV: 88.6 fL (ref 78.0–100.0)
MONOS PCT: 7 %
Monocytes Absolute: 0.9 10*3/uL (ref 0.1–1.0)
NEUTROS ABS: 8.4 10*3/uL — AB (ref 1.7–7.7)
Neutrophils Relative %: 69 %
Platelets: 224 10*3/uL (ref 150–400)
RBC: 5.37 MIL/uL — AB (ref 3.87–5.11)
RDW: 15.4 % (ref 11.5–15.5)
WBC: 12.2 10*3/uL — AB (ref 4.0–10.5)

## 2018-04-27 LAB — COMPREHENSIVE METABOLIC PANEL
ALK PHOS: 91 U/L (ref 38–126)
ALT: 15 U/L (ref 14–54)
AST: 13 U/L — ABNORMAL LOW (ref 15–41)
Albumin: 4.3 g/dL (ref 3.5–5.0)
Anion gap: 13 (ref 5–15)
BILIRUBIN TOTAL: 0.8 mg/dL (ref 0.3–1.2)
BUN: 9 mg/dL (ref 6–20)
CALCIUM: 9.6 mg/dL (ref 8.9–10.3)
CO2: 30 mmol/L (ref 22–32)
CREATININE: 0.76 mg/dL (ref 0.44–1.00)
Chloride: 104 mmol/L (ref 101–111)
GFR calc non Af Amer: >60 mL/min (ref >60)
GLUCOSE: 114 mg/dL — AB (ref 65–99)
Potassium: 2.9 mmol/L — ABNORMAL LOW (ref 3.5–5.1)
Sodium: 147 mmol/L — ABNORMAL HIGH (ref 135–145)
TOTAL PROTEIN: 7.8 g/dL (ref 6.5–8.1)

## 2018-04-27 LAB — LIPASE, BLOOD: Lipase: 38 U/L (ref 11–51)

## 2018-04-27 MED ORDER — SODIUM CHLORIDE 0.9 % IV BOLUS
1000.0000 mL | Freq: Once | INTRAVENOUS | Status: AC
  Administered 2018-04-27: 1000 mL via INTRAVENOUS

## 2018-04-27 MED ORDER — IOPAMIDOL (ISOVUE-300) INJECTION 61%
100.0000 mL | Freq: Once | INTRAVENOUS | Status: AC | PRN
  Administered 2018-04-27: 100 mL via INTRAVENOUS

## 2018-04-27 MED ORDER — HYDROMORPHONE HCL 1 MG/ML IJ SOLN
1.0000 mg | Freq: Once | INTRAMUSCULAR | Status: AC
  Administered 2018-04-27: 1 mg via INTRAVENOUS
  Filled 2018-04-27: qty 1

## 2018-04-27 MED ORDER — HYDROMORPHONE HCL 1 MG/ML IJ SOLN
1.0000 mg | Freq: Once | INTRAMUSCULAR | Status: AC
  Administered 2018-04-27: 1 mg via INTRAVENOUS
  Filled 2018-04-27 (×2): qty 1

## 2018-04-27 MED ORDER — ONDANSETRON HCL 4 MG/2ML IJ SOLN
4.0000 mg | Freq: Once | INTRAMUSCULAR | Status: AC
  Administered 2018-04-27: 4 mg via INTRAVENOUS
  Filled 2018-04-27: qty 2

## 2018-04-27 NOTE — ED Provider Notes (Addendum)
[facility] EMERGENCY DEPARTMENT Provider Note   CSN: [id number] Arrival date & time: 04/27/18  2020     History   Chief Complaint No chief complaint on file.   HPI Virginia Washington is a 62 y.o. female.  HPI   She reports onset of abdominal distention and vomiting today, which feels like an obstruction of the bowels to her.  She states that she had a bowel movement yesterday, which was hard in consistency.  She denies blood in emesis.  She denies fever, chills, cough or chest pain.  History of bowel obstruction, treated with bowel rest; last time August 2017.  She has a history of constipation treated with Linzess.  She last saw gastroenterology about 3 weeks ago.  At that time they plan to do an upper endoscopy which has been scheduled for 05/09/2018.  There are no other known modifying factors.  Past Medical History:  Diagnosis Date  . Chronic back pain   . Constipation   . COPD (chronic obstructive pulmonary disease) ([place])   . DVT (deep venous thrombosis) ([place])   . GERD (gastroesophageal reflux disease)   . Headache   . Hyperlipidemia   . Hypertension   . Normal cardiac stress test 12/2014    Patient Active Problem List   Diagnosis Date Noted  . GERD (gastroesophageal reflux disease) 04/28/2018  . Hypokalemia 04/28/2018  . Pulmonary nodule 04/28/2018  . Hypernatremia 04/28/2018  . Enteritis 04/28/2018  . Encounter for screening colonoscopy 01/16/2018  . Abdominal pain of multiple sites 11/16/2017  . Constipation 11/16/2017  . Partial small bowel obstruction ([place]) 08/16/2016  . Hyperlipidemia 08/16/2016  . Chest pain 12/12/2014  . Hypertension 12/12/2014  . Tobacco abuse 10/29/2011    Past Surgical History:  Procedure Laterality Date  . ABDOMINAL AORTOGRAM W/LOWER EXTREMITY N/A 10/26/2017   Procedure: ABDOMINAL AORTOGRAM W/LOWER EXTREMITY;  Surgeon: Elam Dutch, MD;  Location: [place] [facility];  Service: Cardiovascular;  Laterality: N/A;  . ABDOMINAL  HYSTERECTOMY    . CHOLECYSTECTOMY    . COLONOSCOPY  2007   Dr. Laural Golden: small internal hemorrhoids, otherwise normal. exam could be compromised due to quality of prep  . COLONOSCOPY N/A 02/15/2018   Dr. Oneida Alar: 2 simple adenomas, external and internal hemorrhoids. Next colonoscopy 2024-2026     OB History    Gravida      Para      Term      Preterm      AB      Living  3     SAB      TAB      Ectopic      Multiple      Live Births               Home Medications    Prior to Admission medications   Medication Sig Start Date End Date Taking? Authorizing Provider  albuterol (PROVENTIL HFA;VENTOLIN HFA) 108 (90 Base) MCG/ACT inhaler Inhale 2 puffs into the lungs every 4 (four) hours as needed for wheezing or shortness of breath. 03/01/18   Francine Graven, DO  amLODipine (NORVASC) 5 MG tablet Take 5 mg by mouth daily. 09/11/16   [provider]  atorvastatin (LIPITOR) 20 MG tablet Take 1 tablet (20 mg total) by mouth daily. 12/23/14   Fay Records, MD  dexamethasone (DECADRON) 4 MG tablet Take 1 tablet (4 mg total) by mouth 2 (two) times daily with a meal. 02/17/18   Lily Kocher, PA-C  linaclotide Rolan Lipa)  290 MCG CAPS capsule Take 1 capsule (290 mcg total) by mouth daily before breakfast. 12/10/17   Rolland Porter, MD    Family History Family History  Problem Relation Age of Onset  . Hypertension Mother   . Colon cancer Neg Hx   . Colon polyps Neg Hx     Social History Social History   Tobacco Use  . Smoking status: Current Every Day Smoker    Packs/day: 0.50    Years: 40.00    Pack years: 20.00    Types: Cigarettes  . Smokeless tobacco: Never Used  Substance Use Topics  . Alcohol use: No    Alcohol/week: 0.0 oz  . Drug use: No     Allergies   Patient has no known allergies.   Review of Systems Review of Systems  All other systems reviewed and are negative.    Physical Exam Updated Vital Signs BP 119/63 (BP Location: Left Arm)    Pulse 82   Temp (!) 102.4 F (39.1 C) (Oral) Comment: nurse notified  Resp 16   Ht 5\' 2"  (1.575 m)   Wt 60.5 kg (133 lb 4.8 oz)   SpO2 94%   BMI 24.38 kg/m   Physical Exam  Constitutional: She is oriented to person, place, and time. She appears well-developed. She appears distressed (Groaning in pain).  Appears older than stated age  HENT:  Head: Normocephalic and atraumatic.  Eyes: Pupils are equal, round, and reactive to light. Conjunctivae and EOM are normal.  Neck: Normal range of motion and phonation normal. Neck supple.  Cardiovascular: Normal rate and regular rhythm.  Pulmonary/Chest: Effort normal and breath sounds normal. She exhibits no tenderness.  Abdominal: Soft. She exhibits distension. There is tenderness (Upper, moderate). There is no rebound and no guarding. No hernia.  Musculoskeletal: Normal range of motion.  Neurological: She is alert and oriented to person, place, and time. She exhibits normal muscle tone.  Skin: Skin is warm and dry.  Psychiatric: She has a normal mood and affect. Her behavior is normal. Judgment and thought content normal.  Nursing note and vitals reviewed.    ED Treatments / Results  Labs (all labs ordered are listed, but only abnormal results are displayed) Labs Reviewed  CBC WITH DIFFERENTIAL/PLATELET - Abnormal; Notable for the following components:      Result Value   WBC 12.2 (*)    RBC 5.37 (*)    Hemoglobin 15.3 (*)    HCT 47.6 (*)    Neutro Abs 8.4 (*)    All other components within normal limits  COMPREHENSIVE METABOLIC PANEL - Abnormal; Notable for the following components:   Sodium 147 (*)    Potassium 2.9 (*)    Glucose, Bld 114 (*)    AST 13 (*)    All other components within normal limits  CBC WITH DIFFERENTIAL/PLATELET - Abnormal; Notable for the following components:   WBC 12.4 (*)    RDW 15.9 (*)    Neutro Abs 9.5 (*)    All other components within normal limits  BASIC METABOLIC PANEL - Abnormal; Notable for  the following components:   Glucose, Bld 139 (*)    Calcium 8.2 (*)    All other components within normal limits  CULTURE, BLOOD (ROUTINE X 2)  CULTURE, BLOOD (ROUTINE X 2)  LIPASE, BLOOD  MAGNESIUM  PHOSPHORUS  LACTIC ACID, PLASMA  PROCALCITONIN    EKG None  Radiology Ct Abdomen Pelvis W Contrast  Result Date: 04/28/2018 CLINICAL DATA:  Hervey Ard  umbilical pain and cramping starting today. History of small bowel obstructions in the past. EXAM: CT ABDOMEN AND PELVIS WITH CONTRAST TECHNIQUE: Multidetector CT imaging of the abdomen and pelvis was performed using the standard protocol following bolus administration of intravenous contrast. CONTRAST:  129mL ISOVUE-300 IOPAMIDOL (ISOVUE-300) INJECTION 61% COMPARISON:  03/01/2018 and priors dating back through 08/16/2016 FINDINGS: Lower chest: Bibasilar dependent atelectasis with scattered centrilobular emphysema. Tiny nodular densities at the right lung base, one along the minor fissure likely represents minimal focal pleural thickening or more likely an intrafissural lymph node. This measures up to 3.8 mm. An additional 5 mm nodular density is seen in the right lower lobe anteriorly but is only partially included in could represent a tiny pulmonary nodule or more likely branch point for pulmonary vessel given lack of similar finding on recent comparison. No pleural effusion. Heart size is top-normal. Hepatobiliary: Cholecystectomy. Mild hepatic steatosis. No biliary dilatation or mass. Pancreas: Normal Spleen: Normal Adrenals/Urinary Tract: Normal bilateral adrenal glands. 7 mm interpolar right renal cyst. No nephrolithiasis nor obstructive uropathy. Unremarkable urinary bladder without focal mural thickening or calculus. Stomach/Bowel: Small hiatal hernia. The stomach is otherwise unremarkable. Normal small bowel rotation. The duodenum is decompressed as are loops of proximal and mid jejunum. Fluid-filled dilatation of mid to distal jejunum is identified  measuring up to 2.6 cm in the left lower quadrant and mid abdomen. This may be secondary to what appears to be segmental inflammatory thickening of distal jejunum/proximal ileum, series 2/56. Findings are similar to that seen in 2017. The terminal and distal ileum are decompressed and noninflamed. The colon is unremarkable. Surgical clips are seen in the left hemipelvis. Vascular/Lymphatic: No enlarged lymph nodes.  No aortic aneurysm. Reproductive: Status post hysterectomy. No adnexal masses. Other: No free air nor free fluid. Musculoskeletal: No acute or significant osseous findings. IMPRESSION: 1. Early or partial SBO secondary to what appears to be inflammatory segmental narrowing of distal jejunum/proximal ileum. Findings are suspicious for changes of inflammatory bowel. The appearance is similar to that of prior 2017 exam. 2. Tiny nodular densities in the right lower lobe measuring up to 5 mm. One appears to be within the fissure likely represents an intra fissural lymph node measuring 3.8 mm. No follow-up needed if patient is low-risk (and has no known or suspected primary neoplasm). Non-contrast chest CT can be considered in 12 months if patient is high-risk. This recommendation follows the consensus statement: Guidelines for Management of Incidental Pulmonary Nodules Detected on CT Images: From the Fleischner Society 2017; Radiology 2017; 284:228-243. 3. Mild hepatic steatosis. 4. 7 mm interpolar right renal cyst. Electronically Signed   By: Ashley Royalty M.D.   On: 04/28/2018 00:21    Procedures Procedures (including critical care time)  Medications Ordered in ED Medications  enoxaparin (LOVENOX) injection 40 mg (40 mg Subcutaneous Given 04/28/18 0254)  acetaminophen (TYLENOL) tablet 650 mg (650 mg Oral Given 04/28/18 1315)    Or  acetaminophen (TYLENOL) suppository 650 mg ( Rectal See Alternative 04/28/18 1315)  ondansetron (ZOFRAN) tablet 4 mg (has no administration in time range)    Or    ondansetron (ZOFRAN) injection 4 mg (has no administration in time range)  HYDROmorphone (DILAUDID) injection 1 mg (1 mg Intravenous Given 04/28/18 1013)  famotidine (PEPCID) IVPB 20 mg premix (0 mg Intravenous Stopped 04/28/18 0708)  0.45 % NaCl with KCl 20 mEq / L infusion ( Intravenous New Bag/Given 04/28/18 1009)  HYDROmorphone (DILAUDID) injection 1 mg (1 mg Intravenous Given 04/27/18 2155)  ondansetron [facility]) injection 4 mg (4 mg Intravenous Given 04/27/18 2155)  sodium chloride 0.9 % bolus 1,000 mL (0 mLs Intravenous Stopped 04/27/18 2318)  HYDROmorphone (DILAUDID) injection 1 mg (1 mg Intravenous Given 04/27/18 2344)  iopamidol (ISOVUE-300) 61 % injection 100 mL (100 mLs Intravenous Contrast Given 04/27/18 2328)  potassium chloride 10 mEq in 100 mL IVPB (0 mEq Intravenous Stopped 04/28/18 0625)  magnesium sulfate IVPB 2 g 50 mL (0 g Intravenous Stopped 04/28/18 0359)     Initial Impression / Assessment and Plan / ED Course  I have reviewed the triage vital signs and the nursing notes.  Pertinent labs & imaging results that were available during my care of the patient were reviewed by me and considered in my medical decision making (see chart for details).  Clinical Course as of Apr 28 1528  Sun Apr 28, 2018  0027 Normal  Lipase, blood [EW]  0027 Normal except elevated WBC, and hemoglobin.  CBC with Differential(!) [EW]  0028 Normal except elevated sodium, low potassium, elevated glucose  Comprehensive metabolic panel(!) [EW]  1191 Evaluation consistent with small bowel obstruction, with inflammatory changes, similar to prior, in 2017  CT Abdomen Pelvis W Contrast [EW]    Clinical Course User Index [EW] Daleen Bo, MD     Patient Vitals for the past 24 hrs:  BP Temp Temp src Pulse Resp SpO2 Height Weight  04/28/18 1316 - - - 82 - 94 % - -  04/28/18 1310 119/63 (!) 102.4 F (39.1 C) Oral 85 16 (!) 79 % - -  04/28/18 0524 128/75 99.6 F (37.6 C) Oral 89 - 96 % - -   04/28/18 0219 127/73 98.5 F (36.9 C) Oral 74 - 93 % - 60.5 kg (133 lb 4.8 oz)  04/27/18 2321 111/77 98.2 F (36.8 C) Oral 60 13 93 % - -  04/27/18 2300 - - - - 15 - - -  04/27/18 2044 - - - - - - 5\' 2"  (1.575 m) 60.3 kg (133 lb)  04/27/18 2043 135/76 98.4 F (36.9 C) Oral 80 20 99 % - -    12:37 AM Reevaluation with update and discussion. After initial assessment and treatment, an updated evaluation reveals the patient is now comfortable reclining in bed and states her pain is improved, following second hydromorphone.  She was informed of the findings and recommended plan to stay in the hospital.  She indicated that she did not want an NG tube placed at this time.  Findings discussed with the patient all questions answered. Daleen Bo   Medical Decision Making: Recurrent small bowel obstruction, early, with likely inflammatory changes of the small bowel, as the initiating factor.  Patient does not have a distended stomach and she wishes to avoid NG placement at this time.  Hospitalist consulted for admission for bowel rest, which previously benefited her in 2017.  No indication for acute surgical intervention.  CRITICAL CARE-no Performed by: Daleen Bo   Nursing Notes Reviewed/ Care Coordinated Applicable Imaging Reviewed Interpretation of Laboratory Data incorporated into ED treatment  12:38 AM-Consult complete with hospitalist. Patient case explained and discussed.  He agrees to admit patient for further evaluation and treatment. Call ended at 12:58 AM  Pain: Admit    Final Clinical Impressions(s) / ED Diagnoses   Final diagnoses:  Small bowel obstruction [facility])    ED Discharge Orders    None       Daleen Bo, MD 04/28/18 Kerrin Champagne    Daleen Bo, MD  04/28/18 1529  

## 2018-04-27 NOTE — ED Notes (Signed)
Pt states she is passing gas and notes her BM yesterday was formed brown.

## 2018-04-27 NOTE — ED Triage Notes (Signed)
Pt C/O abdominal pain and cramping that started today. Pt states her last bowel movement was Friday. Pt C/O N/V as well.

## 2018-04-28 ENCOUNTER — Observation Stay (HOSPITAL_COMMUNITY): Payer: Medicare HMO

## 2018-04-28 ENCOUNTER — Encounter (HOSPITAL_COMMUNITY): Payer: Self-pay | Admitting: Internal Medicine

## 2018-04-28 ENCOUNTER — Other Ambulatory Visit: Payer: Self-pay

## 2018-04-28 DIAGNOSIS — R05 Cough: Secondary | ICD-10-CM | POA: Diagnosis not present

## 2018-04-28 DIAGNOSIS — E876 Hypokalemia: Secondary | ICD-10-CM | POA: Diagnosis present

## 2018-04-28 DIAGNOSIS — K529 Noninfective gastroenteritis and colitis, unspecified: Secondary | ICD-10-CM | POA: Diagnosis not present

## 2018-04-28 DIAGNOSIS — K56609 Unspecified intestinal obstruction, unspecified as to partial versus complete obstruction: Secondary | ICD-10-CM

## 2018-04-28 DIAGNOSIS — R0602 Shortness of breath: Secondary | ICD-10-CM | POA: Diagnosis not present

## 2018-04-28 DIAGNOSIS — Z720 Tobacco use: Secondary | ICD-10-CM

## 2018-04-28 DIAGNOSIS — K219 Gastro-esophageal reflux disease without esophagitis: Secondary | ICD-10-CM | POA: Diagnosis present

## 2018-04-28 DIAGNOSIS — R911 Solitary pulmonary nodule: Secondary | ICD-10-CM | POA: Diagnosis not present

## 2018-04-28 DIAGNOSIS — E782 Mixed hyperlipidemia: Secondary | ICD-10-CM | POA: Diagnosis not present

## 2018-04-28 DIAGNOSIS — E870 Hyperosmolality and hypernatremia: Secondary | ICD-10-CM | POA: Diagnosis present

## 2018-04-28 DIAGNOSIS — K56600 Partial intestinal obstruction, unspecified as to cause: Principal | ICD-10-CM

## 2018-04-28 LAB — CBC WITH DIFFERENTIAL/PLATELET
BASOS ABS: 0 10*3/uL (ref 0.0–0.1)
Basophils Relative: 0 %
EOS PCT: 0 %
Eosinophils Absolute: 0 10*3/uL (ref 0.0–0.7)
HCT: 42 % (ref 36.0–46.0)
Hemoglobin: 12.8 g/dL (ref 12.0–15.0)
Lymphocytes Relative: 17 %
Lymphs Abs: 2.2 10*3/uL (ref 0.7–4.0)
MCH: 27.5 pg (ref 26.0–34.0)
MCHC: 30.5 g/dL (ref 30.0–36.0)
MCV: 90.3 fL (ref 78.0–100.0)
Monocytes Absolute: 0.8 10*3/uL (ref 0.1–1.0)
Monocytes Relative: 6 %
Neutro Abs: 9.5 10*3/uL — ABNORMAL HIGH (ref 1.7–7.7)
Neutrophils Relative %: 77 %
PLATELETS: 218 10*3/uL (ref 150–400)
RBC: 4.65 MIL/uL (ref 3.87–5.11)
RDW: 15.9 % — ABNORMAL HIGH (ref 11.5–15.5)
WBC: 12.4 10*3/uL — AB (ref 4.0–10.5)

## 2018-04-28 LAB — URINALYSIS, COMPLETE (UACMP) WITH MICROSCOPIC
Bilirubin Urine: NEGATIVE
Glucose, UA: NEGATIVE mg/dL
KETONES UR: NEGATIVE mg/dL
LEUKOCYTES UA: NEGATIVE
NITRITE: NEGATIVE
PH: 6 (ref 5.0–8.0)
Protein, ur: NEGATIVE mg/dL
Specific Gravity, Urine: 1.005 (ref 1.005–1.030)

## 2018-04-28 LAB — BASIC METABOLIC PANEL
ANION GAP: 7 (ref 5–15)
BUN: 8 mg/dL (ref 6–20)
CO2: 28 mmol/L (ref 22–32)
Calcium: 8.2 mg/dL — ABNORMAL LOW (ref 8.9–10.3)
Chloride: 104 mmol/L (ref 101–111)
Creatinine, Ser: 0.69 mg/dL (ref 0.44–1.00)
Glucose, Bld: 139 mg/dL — ABNORMAL HIGH (ref 65–99)
POTASSIUM: 3.9 mmol/L (ref 3.5–5.1)
Sodium: 139 mmol/L (ref 135–145)

## 2018-04-28 LAB — MAGNESIUM: Magnesium: 1.8 mg/dL (ref 1.7–2.4)

## 2018-04-28 LAB — PHOSPHORUS: PHOSPHORUS: 2.8 mg/dL (ref 2.5–4.6)

## 2018-04-28 LAB — PROCALCITONIN: Procalcitonin: <0.1 ng/mL

## 2018-04-28 LAB — LACTIC ACID, PLASMA: LACTIC ACID, VENOUS: 0.8 mmol/L (ref 0.5–1.9)

## 2018-04-28 MED ORDER — HYDROMORPHONE HCL 1 MG/ML IJ SOLN
1.0000 mg | INTRAMUSCULAR | Status: DC | PRN
  Administered 2018-04-28 – 2018-04-29 (×4): 1 mg via INTRAVENOUS
  Filled 2018-04-28 (×4): qty 1

## 2018-04-28 MED ORDER — POTASSIUM CHLORIDE IN NACL 20-0.45 MEQ/L-% IV SOLN
INTRAVENOUS | Status: DC
  Administered 2018-04-28 – 2018-04-30 (×4): via INTRAVENOUS
  Filled 2018-04-28 (×6): qty 1000

## 2018-04-28 MED ORDER — MAGNESIUM SULFATE 2 GM/50ML IV SOLN
2.0000 g | Freq: Once | INTRAVENOUS | Status: AC
  Administered 2018-04-28: 2 g via INTRAVENOUS
  Filled 2018-04-28: qty 50

## 2018-04-28 MED ORDER — ACETAMINOPHEN 325 MG PO TABS
650.0000 mg | ORAL_TABLET | Freq: Four times a day (QID) | ORAL | Status: DC | PRN
  Administered 2018-04-28: 650 mg via ORAL
  Filled 2018-04-28: qty 2

## 2018-04-28 MED ORDER — FAMOTIDINE IN NACL 20-0.9 MG/50ML-% IV SOLN
20.0000 mg | Freq: Two times a day (BID) | INTRAVENOUS | Status: DC
  Administered 2018-04-28 – 2018-04-30 (×5): 20 mg via INTRAVENOUS
  Filled 2018-04-28 (×6): qty 50

## 2018-04-28 MED ORDER — LINACLOTIDE 145 MCG PO CAPS
290.0000 ug | ORAL_CAPSULE | Freq: Every day | ORAL | Status: DC
  Administered 2018-04-28: 290 ug via ORAL
  Filled 2018-04-28 (×2): qty 1
  Filled 2018-04-28: qty 2

## 2018-04-28 MED ORDER — POTASSIUM CHLORIDE 10 MEQ/100ML IV SOLN
10.0000 meq | INTRAVENOUS | Status: AC
  Administered 2018-04-28 (×4): 10 meq via INTRAVENOUS
  Filled 2018-04-28 (×4): qty 100

## 2018-04-28 MED ORDER — ACETAMINOPHEN 650 MG RE SUPP: 650.0000 mg | Freq: Four times a day (QID) | RECTAL | Status: DC | PRN

## 2018-04-28 MED ORDER — PIPERACILLIN-TAZOBACTAM 3.375 G IVPB
3.3750 g | Freq: Three times a day (TID) | INTRAVENOUS | Status: DC
  Administered 2018-04-28 – 2018-04-30 (×6): 3.375 g via INTRAVENOUS
  Filled 2018-04-28 (×7): qty 50

## 2018-04-28 MED ORDER — POTASSIUM CL IN DEXTROSE 5% 20 MEQ/L IV SOLN
20.0000 meq | INTRAVENOUS | Status: DC
  Administered 2018-04-28: 20 meq via INTRAVENOUS
  Filled 2018-04-28 (×5): qty 1000

## 2018-04-28 MED ORDER — ONDANSETRON HCL 4 MG/2ML IJ SOLN: 4.0000 mg | Freq: Four times a day (QID) | INTRAMUSCULAR | Status: DC | PRN

## 2018-04-28 MED ORDER — ENOXAPARIN SODIUM 40 MG/0.4ML ~~LOC~~ SOLN
40.0000 mg | SUBCUTANEOUS | Status: DC
  Administered 2018-04-28 – 2018-04-30 (×3): 40 mg via SUBCUTANEOUS
  Filled 2018-04-28 (×3): qty 0.4

## 2018-04-28 MED ORDER — ONDANSETRON HCL 4 MG PO TABS: 4.0000 mg | ORAL_TABLET | Freq: Four times a day (QID) | ORAL | Status: DC | PRN

## 2018-04-28 NOTE — Progress Notes (Signed)
Patient has a temperature of 102. 4 will given as needed Tylenol. Patient SPO2 at RA is 79 will start on Nasal cannula at 2L. MD notified.

## 2018-04-28 NOTE — H&P (Signed)
History and Physical    Virginia Washington YBO:[id number] DOB: December 06, 1956 DOA: 04/27/2018  PCP: [name], MD   Patient coming from: Home.  I have personally briefly reviewed patient's old medical records in [place]  Chief Complaint: Abdominal pain.  HPI: Virginia Washington is a 62 y.o. female with medical history significant of chronic back pain, chronic constipation, COPD, history of DVT, GERD, headaches, hyperlipidemia, hypertension, history of normal stress test, history of previous SBO who is coming to the emergency department with complaints of abdominal and cramping that started yesterday evening at home after supper. This was followed by a total of seven episodes of emesis since then.  She denies diarrhea and states the her last bowel movement was on Friday, and she was constipated the previous day.  No melena, hematochezia, flank pain, dysuria, frequency or hematuria.  She denies fever, chills, sore throat, dyspnea, chest pain, palpitations, dizziness, diaphoresis, PND, orthopnea or pitting edema of the lower extremities.  No polyuria, polydipsia or blurred vision.  No heat or cold intolerance.  No skin rashes.  ED Course: Initial vital signs temperature 98.4 F, pulse 80, respiration 20, blood pressure 135/76 and O2 sat 99% on room air.  The patient received at 1000 mL normal saline bolus, Zofran 4 mg IVP x1 and hydromorphone 1 mg IVP x2 (2130 and 2330) reporting partial relief.  Her work-up shows a white count was 12.2 with 69% neutrophils, 24 lymphocytes and 7% monocytes.  Hemoglobin 15.3 g/dL and platelets 224.  Her baseline hemoglobin is usually less than 14.  Lipase was normal.  Sodium 147, potassium 2.9, chloride 104, CO2 30 mmol/L.  BUN was 9, creatinine 0.76, calcium 9.6, glucose 114, magnesium 1.8 and phosphorus 2.8 mg/dL.  Her AST was 13 U/L, but all other hepatic function tests were normal.  Review of Systems: As per HPI otherwise 10 point review of systems negative.    Past Medical History:  Diagnosis Date  . Chronic back pain   . Constipation   . COPD (chronic obstructive pulmonary disease) ([place])   . DVT (deep venous thrombosis) ([place])   . GERD (gastroesophageal reflux disease)   . Headache   . Hyperlipidemia   . Hypertension   . Normal cardiac stress test 12/2014    Past Surgical History:  Procedure Laterality Date  . ABDOMINAL AORTOGRAM W/LOWER EXTREMITY N/A 10/26/2017   Procedure: ABDOMINAL AORTOGRAM W/LOWER EXTREMITY;  Surgeon: Elam Dutch, MD;  Location: [facility];  Service: Cardiovascular;  Laterality: N/A;  . ABDOMINAL HYSTERECTOMY    . CHOLECYSTECTOMY    . COLONOSCOPY  2007   Dr. Laural Golden: small internal hemorrhoids, otherwise normal. exam could be compromised due to quality of prep  . COLONOSCOPY N/A 02/15/2018   Dr. Oneida Alar: 2 simple adenomas, external and internal hemorrhoids. Next colonoscopy 2024-2026     reports that she has been smoking cigarettes.  She has a 20.00 pack-year smoking history. She has never used smokeless tobacco. She reports that she does not drink alcohol or use drugs.  No Known Allergies  Family History  Problem Relation Age of Onset  . Hypertension Mother   . Colon cancer Neg Hx   . Colon polyps Neg Hx     Prior to Admission medications   Medication Sig Start Date End Date Taking? Authorizing Provider  albuterol (PROVENTIL HFA;VENTOLIN HFA) 108 (90 Base) MCG/ACT inhaler Inhale 2 puffs into the lungs every 4 (four) hours as needed for wheezing or shortness of breath. 03/01/18  Francine Graven, DO  amLODipine (NORVASC) 5 MG tablet Take 5 mg by mouth daily. 09/11/16   [provider]  atorvastatin (LIPITOR) 20 MG tablet Take 1 tablet (20 mg total) by mouth daily. 12/23/14   Fay Records, MD  dexamethasone (DECADRON) 4 MG tablet Take 1 tablet (4 mg total) by mouth 2 (two) times daily with a meal. 02/17/18   Lily Kocher, PA-C  linaclotide [facility]) 290 MCG CAPS capsule Take 1 capsule (290  mcg total) by mouth daily before breakfast. 12/10/17   Rolland Porter, MD    Physical Exam: Vitals:   04/27/18 2044 04/27/18 2300 04/27/18 2321 04/28/18 0219  BP:   111/77 127/73  Pulse:   60 74  Resp:  15 13   Temp:   98.2 F (36.8 C) 98.5 F (36.9 C)  TempSrc:   Oral Oral  SpO2:   93% 93%  Weight: 60.3 kg (133 lb)   60.5 kg (133 lb 4.8 oz)  Height: 5\' 2"  (1.575 m)       Constitutional: NAD, calm, comfortable Eyes: PERRL, lids and conjunctivae normal. ENMT: Mucous membranes are mildly dry. Posterior pharynx clear of any exudate or lesions. Neck: normal, supple, no masses, no thyromegaly Respiratory: clear to auscultation bilaterally, no wheezing, no crackles. Normal respiratory effort. No accessory muscle use.  Cardiovascular: Regular rate and rhythm, no murmurs / rubs / gallops. No extremity edema. 2+ pedal pulses. No carotid bruits.  Abdomen: Soft, positive epigastric and upper quadrant tenderness, no guarding/rebound/masses palpated. No hepatosplenomegaly. Bowel sounds positive.  Musculoskeletal: no clubbing / cyanosis. Good ROM, no contractures. Normal muscle tone.  Skin: no rashes, lesions, ulcers  Neurologic: CN 2-12 grossly intact. Sensation intact, DTR normal. Strength 5/5 in all 4.  Psychiatric: Normal judgment and insight. Alert and oriented x 4. Normal mood.    Labs on Admission: I have personally reviewed following labs and imaging studies  CBC: Recent Labs  Lab 04/27/18 2156  WBC 12.2*  NEUTROABS 8.4*  HGB 15.3*  HCT 47.6*  MCV 88.6  PLT 382   Basic Metabolic Panel: Recent Labs  Lab 04/27/18 2156  NA 147*  K 2.9*  CL 104  CO2 30  GLUCOSE 114*  BUN 9  CREATININE 0.76  CALCIUM 9.6  MG 1.8  PHOS 2.8   GFR: Estimated Creatinine Clearance: 64.9 mL/min (by C-G formula based on SCr of 0.76 mg/dL). Liver Function Tests: Recent Labs  Lab 04/27/18 2156  AST 13*  ALT 15  ALKPHOS 91  BILITOT 0.8  PROT 7.8  ALBUMIN 4.3   Recent Labs  Lab  04/27/18 2156  LIPASE 38   No results for input(s): AMMONIA in the last 168 hours. Coagulation Profile: No results for input(s): INR, PROTIME in the last 168 hours. Cardiac Enzymes: No results for input(s): CKTOTAL, CKMB, CKMBINDEX, TROPONINI in the last 168 hours. BNP (last 3 results) No results for input(s): PROBNP in the last 8760 hours. HbA1C: No results for input(s): HGBA1C in the last 72 hours. CBG: No results for input(s): GLUCAP in the last 168 hours. Lipid Profile: No results for input(s): CHOL, HDL, LDLCALC, TRIG, CHOLHDL, LDLDIRECT in the last 72 hours. Thyroid Function Tests: No results for input(s): TSH, T4TOTAL, FREET4, T3FREE, THYROIDAB in the last 72 hours. Anemia Panel: No results for input(s): VITAMINB12, FOLATE, FERRITIN, TIBC, IRON, RETICCTPCT in the last 72 hours. Urine analysis:    Component Value Date/Time   COLORURINE YELLOW 03/01/2018 1810   APPEARANCEUR CLOUDY (A) 03/01/2018 1810   LABSPEC  1.021 03/01/2018 1810   PHURINE 5.0 03/01/2018 1810   GLUCOSEU NEGATIVE 03/01/2018 1810   HGBUR NEGATIVE 03/01/2018 1810   BILIRUBINUR NEGATIVE 03/01/2018 1810   KETONESUR NEGATIVE 03/01/2018 1810   PROTEINUR NEGATIVE 03/01/2018 1810   UROBILINOGEN 0.2 09/19/2015 0928   NITRITE NEGATIVE 03/01/2018 1810   LEUKOCYTESUR NEGATIVE 03/01/2018 1810    Radiological Exams on Admission: Ct Abdomen Pelvis W Contrast  Result Date: 04/28/2018 CLINICAL DATA:  Sharp umbilical pain and cramping starting today. History of small bowel obstructions in the past. EXAM: CT ABDOMEN AND PELVIS WITH CONTRAST TECHNIQUE: Multidetector CT imaging of the abdomen and pelvis was performed using the standard protocol following bolus administration of intravenous contrast. CONTRAST:  169mL ISOVUE-300 IOPAMIDOL (ISOVUE-300) INJECTION 61% COMPARISON:  03/01/2018 and priors dating back through 08/16/2016 FINDINGS: Lower chest: Bibasilar dependent atelectasis with scattered centrilobular emphysema.  Tiny nodular densities at the right lung base, one along the minor fissure likely represents minimal focal pleural thickening or more likely an intrafissural lymph node. This measures up to 3.8 mm. An additional 5 mm nodular density is seen in the right lower lobe anteriorly but is only partially included in could represent a tiny pulmonary nodule or more likely branch point for pulmonary vessel given lack of similar finding on recent comparison. No pleural effusion. Heart size is top-normal. Hepatobiliary: Cholecystectomy. Mild hepatic steatosis. No biliary dilatation or mass. Pancreas: Normal Spleen: Normal Adrenals/Urinary Tract: Normal bilateral adrenal glands. 7 mm interpolar right renal cyst. No nephrolithiasis nor obstructive uropathy. Unremarkable urinary bladder without focal mural thickening or calculus. Stomach/Bowel: Small hiatal hernia. The stomach is otherwise unremarkable. Normal small bowel rotation. The duodenum is decompressed as are loops of proximal and mid jejunum. Fluid-filled dilatation of mid to distal jejunum is identified measuring up to 2.6 cm in the left lower quadrant and mid abdomen. This may be secondary to what appears to be segmental inflammatory thickening of distal jejunum/proximal ileum, series 2/56. Findings are similar to that seen in 2017. The terminal and distal ileum are decompressed and noninflamed. The colon is unremarkable. Surgical clips are seen in the left hemipelvis. Vascular/Lymphatic: No enlarged lymph nodes.  No aortic aneurysm. Reproductive: Status post hysterectomy. No adnexal masses. Other: No free air nor free fluid. Musculoskeletal: No acute or significant osseous findings. IMPRESSION: 1. Early or partial SBO secondary to what appears to be inflammatory segmental narrowing of distal jejunum/proximal ileum. Findings are suspicious for changes of inflammatory bowel. The appearance is similar to that of prior 2017 exam. 2. Tiny nodular densities in the right lower  lobe measuring up to 5 mm. One appears to be within the fissure likely represents an intra fissural lymph node measuring 3.8 mm. No follow-up needed if patient is low-risk (and has no known or suspected primary neoplasm). Non-contrast chest CT can be considered in 12 months if patient is high-risk. This recommendation follows the consensus statement: Guidelines for Management of Incidental Pulmonary Nodules Detected on CT Images: From the Fleischner Society 2017; Radiology 2017; 284:228-243. 3. Mild hepatic steatosis. 4. 7 mm interpolar right renal cyst. Electronically Signed   By: Ashley Royalty M.D.   On: 04/28/2018 00:21    EKG: Independently reviewed.   Assessment/Plan Principal Problem:   SBO (small bowel obstruction) (HCC) Admit to telemetry/observation. Keep nothing by mouth. Continue IV fluids. Continue hydromorphone 1 mg IVP every 4 hours as needed for pain. Continue Zofran 4 mg IVP every 6 hours as needed for nausea . The patient declined NGT suctioning. General surgery has  been consulted.  Active Problems:   Hypokalemia Replacing. Magnesium has been supplemented. Follow-up potassium level in a.m.     Hypernatremia Continue D5 water plus KCl 20 mEq effusion. Follow-up sodium level in the morning.    Pulmonary nodule Radiology has recommended a follow-up noncontrast chest CT in about 12 months. This was communicated to the patient in verbal and written form.    Tobacco abuse Nicotine replacement therapy offered. Staff to provide tobacco cessation information.    Hypertension On amlodipine 5 mg p.o. daily. Monitor blood pressure.    Hyperlipidemia Hold atorvastatin for now.    Constipation Currently n.p.o. Continue Linzess. Consider switching amlodipine to a different agent.    GERD (gastroesophageal reflux disease) Famotidine 20 mg IVP every 12 hours.    DVT prophylaxis: Lovenox SQ. Code Status: Full code. Family Communication: Disposition Plan: Admit for  IV hydration, symptoms management and surgery evaluation. Consults called: Routine general surgery consult. Admission status: Inpatient/telemetry.   Reubin Milan MD [facility] Pager [phone].  If 7PM-7AM, please contact night-coverage [email or web address] Password [facility]  04/28/2018, 3:54 AM

## 2018-04-28 NOTE — Progress Notes (Addendum)
PROGRESS NOTE  Virginia Washington AOZ:[id number] DOB: 1956/07/11 DOA: 04/27/2018 PCP: [name], MD  Brief History:  62 year old female with a history of COPD, hyperlipidemia, hypertension, constipation presenting with abdominal pain, distention, and nausea vomiting that began on the evening of 04/27/2018.  The patient states that she had a bowel obstruction in 2017, but does not recall the circumstances or etiology.  Her last bowel movement was on 04/26/2018.  She denies any fevers, chills, diarrhea, hematochezia, melena.  She denies any NSAIDs.  She denies any recent travels or new medications.  Interestingly, the patient relates postprandial abdominal pain for the past 1 to 2 years.  She also describes early satiety.  She states that her abdominal pain usually occurs 5 minutes after eating.  She is due to have an EGD on 05/09/2018.  CT of the abdomen and pelvis at the time of admission showed fluid-filled and dilated jejunum up to 2.6 cm with a segmental inflammatory thickening of the distal jejunum/proximal ileum.  The terminal ileum and distal ileum were decompressed without inflammatory changes.  The patient was noted to have sodium 147, potassium 2.9, WBC 12.2 at the time of admission.  General surgery and GI were consulted to assist with management.  In the afternoon of 04/28/2018, the patient developed a fever of 102.4 F and hypoxia with oxygen saturation 84% on room air..  Blood cultures x2 sets was obtained.  The patient subsequently endorsed that she has had coughing and congestion for the past 2 weeks.  She saw her primary care provider 1 to 2 weeks prior to this admission, and she stated that she was started on "cough medicine".  She has also been complaining of some mild shortness of breath.  She continues to smoke 1/2 pack/day.  Assessment/Plan: PSBO -04/27/2018 CT abdomen and pelvis--early or partial SBO secondary to inflammatory segmental narrowing of the distal  jejunum/proximal ileum -Appreciate general surgery consult -Case discussed with Dr. Constance Haw -consult GI--?IBD/enteritis -Ice chips for now -IV fluids -Judicious pain control -d/c linzess  Acute respiratory failure with hypoxia -Stable on 2 L nasal cannula -Obtain chest x-ray  Fever -Personally obtained vital signs at 1330--HR 84--RR18--107/64--94% 2L -blood cultures x 2 -UA and urine culture -CXR -procalcitonin -lactic acid -start empiric zosyn  Hypernatremia -Continue hypotonic fluid -A.m. BMP  Pulmonary nodule -Incidental finding on CT -Outpatient surveillance  Essential hypertension -Holding amlodipine until able to tolerate po -Hydralazine prn SBP 180  Hyperlipidemia -Restart statin once able to tolerate po  Tobacco abuse -I have discussed tobacco cessation with the patient.  I have counseled the patient regarding the negative impacts of continued tobacco use including but not limited to lung cancer, COPD, and cardiovascular disease.  I have discussed alternatives to tobacco and modalities that may help facilitate tobacco cessation including but not limited to biofeedback, hypnosis, and medications.  Total time spent with tobacco counseling was 4 minutes.  Hypokalemia -replete -check mag   Disposition Plan:   Home in 2-3 days  Family Communication:  No Family at bedside--Total time spent 35 minutes.  Greater than 50% spent face to face counseling and coordinating care. 0830 to 0905   Consultants:  GI, general surgery  Code Status:  FULL  DVT Prophylaxis:  [place] Lovenox   Procedures: As Listed in Progress Note Above  Antibiotics: None    Subjective: Patient states that abdominal pain is better.  She has not had any emesis this morning.  She has some  nausea.  Last bowel movement was on 04/26/2018.  She is passing flatus.  She denies any chest pain, shortness breath, headache, fever, chills, dysuria, hematuria.  There is no rashes.  Objective: Vitals:     04/27/18 2300 04/27/18 2321 04/28/18 0219 04/28/18 0524  BP:  111/77 127/73 128/75  Pulse:  60 74 89  Resp: 15 13    Temp:  98.2 F (36.8 C) 98.5 F (36.9 C) 99.6 F (37.6 C)  TempSrc:  Oral Oral Oral  SpO2:  93% 93% 96%  Weight:   60.5 kg (133 lb 4.8 oz)   Height:        Intake/Output Summary (Last 24 hours) at 04/28/2018 0855 Last data filed at 04/28/2018 0657 Gross per 24 hour  Intake 731.25 ml  Output 250 ml  Net 481.25 ml   Weight change:  Exam:   General:  Pt is alert, follows commands appropriately, not in acute distress  HEENT: No icterus, No thrush, No neck mass, [place]/AT  Cardiovascular: RRR, S1/S2, no rubs, no gallops  Respiratory: diminished BS but CTA bilaterally, no wheezing, no crackles, no rhonchi  Abdomen: Soft/+BS, non tender, non distended, no guarding  Extremities: No edema, No lymphangitis, No petechiae, No rashes, no synovitis   Data Reviewed: I have personally reviewed following labs and imaging studies Basic Metabolic Panel: Recent Labs  Lab 04/27/18 2156 04/28/18 0704  NA 147* 139  K 2.9* 3.9  CL 104 104  CO2 30 28  GLUCOSE 114* 139*  BUN 9 8  CREATININE 0.76 0.69  CALCIUM 9.6 8.2*  MG 1.8  --   PHOS 2.8  --    Liver Function Tests: Recent Labs  Lab 04/27/18 2156  AST 13*  ALT 15  ALKPHOS 91  BILITOT 0.8  PROT 7.8  ALBUMIN 4.3   Recent Labs  Lab 04/27/18 2156  LIPASE 38   No results for input(s): AMMONIA in the last 168 hours. Coagulation Profile: No results for input(s): INR, PROTIME in the last 168 hours. CBC: Recent Labs  Lab 04/27/18 2156 04/28/18 0704  WBC 12.2* 12.4*  NEUTROABS 8.4* 9.5*  HGB 15.3* 12.8  HCT 47.6* 42.0  MCV 88.6 90.3  PLT 224 218   Cardiac Enzymes: No results for input(s): CKTOTAL, CKMB, CKMBINDEX, TROPONINI in the last 168 hours. BNP: Invalid input(s): POCBNP CBG: No results for input(s): GLUCAP in the last 168 hours. HbA1C: No results for input(s): HGBA1C in the last 72  hours. Urine analysis:    Component Value Date/Time   COLORURINE YELLOW 03/01/2018 1810   APPEARANCEUR CLOUDY (A) 03/01/2018 1810   LABSPEC 1.021 03/01/2018 1810   PHURINE 5.0 03/01/2018 1810   GLUCOSEU NEGATIVE 03/01/2018 1810   HGBUR NEGATIVE 03/01/2018 1810   BILIRUBINUR NEGATIVE 03/01/2018 1810   KETONESUR NEGATIVE 03/01/2018 1810   PROTEINUR NEGATIVE 03/01/2018 1810   UROBILINOGEN 0.2 09/19/2015 0928   NITRITE NEGATIVE 03/01/2018 1810   LEUKOCYTESUR NEGATIVE 03/01/2018 1810   Sepsis Labs: @LABRCNTIP (procalcitonin:4,lacticidven:4) )No results found for this or any previous visit (from the past 240 hour(s)).   Scheduled Meds: . enoxaparin (LOVENOX) injection  40 mg Subcutaneous Q24H  . linaclotide  290 mcg Oral QAC breakfast   Continuous Infusions: . dextrose 5 % with KCl 20 mEq / L 20 mEq (04/28/18 0253)  . famotidine (PEPCID) IV Stopped (04/28/18 0708)    Procedures/Studies: Ct Abdomen Pelvis W Contrast  Result Date: 04/28/2018 CLINICAL DATA:  Sharp umbilical pain and cramping starting today. History of small bowel obstructions in  the past. EXAM: CT ABDOMEN AND PELVIS WITH CONTRAST TECHNIQUE: Multidetector CT imaging of the abdomen and pelvis was performed using the standard protocol following bolus administration of intravenous contrast. CONTRAST:  190mL ISOVUE-300 IOPAMIDOL (ISOVUE-300) INJECTION 61% COMPARISON:  03/01/2018 and priors dating back through 08/16/2016 FINDINGS: Lower chest: Bibasilar dependent atelectasis with scattered centrilobular emphysema. Tiny nodular densities at the right lung base, one along the minor fissure likely represents minimal focal pleural thickening or more likely an intrafissural lymph node. This measures up to 3.8 mm. An additional 5 mm nodular density is seen in the right lower lobe anteriorly but is only partially included in could represent a tiny pulmonary nodule or more likely branch point for pulmonary vessel given lack of similar  finding on recent comparison. No pleural effusion. Heart size is top-normal. Hepatobiliary: Cholecystectomy. Mild hepatic steatosis. No biliary dilatation or mass. Pancreas: Normal Spleen: Normal Adrenals/Urinary Tract: Normal bilateral adrenal glands. 7 mm interpolar right renal cyst. No nephrolithiasis nor obstructive uropathy. Unremarkable urinary bladder without focal mural thickening or calculus. Stomach/Bowel: Small hiatal hernia. The stomach is otherwise unremarkable. Normal small bowel rotation. The duodenum is decompressed as are loops of proximal and mid jejunum. Fluid-filled dilatation of mid to distal jejunum is identified measuring up to 2.6 cm in the left lower quadrant and mid abdomen. This may be secondary to what appears to be segmental inflammatory thickening of distal jejunum/proximal ileum, series 2/56. Findings are similar to that seen in 2017. The terminal and distal ileum are decompressed and noninflamed. The colon is unremarkable. Surgical clips are seen in the left hemipelvis. Vascular/Lymphatic: No enlarged lymph nodes.  No aortic aneurysm. Reproductive: Status post hysterectomy. No adnexal masses. Other: No free air nor free fluid. Musculoskeletal: No acute or significant osseous findings. IMPRESSION: 1. Early or partial SBO secondary to what appears to be inflammatory segmental narrowing of distal jejunum/proximal ileum. Findings are suspicious for changes of inflammatory bowel. The appearance is similar to that of prior 2017 exam. 2. Tiny nodular densities in the right lower lobe measuring up to 5 mm. One appears to be within the fissure likely represents an intra fissural lymph node measuring 3.8 mm. No follow-up needed if patient is low-risk (and has no known or suspected primary neoplasm). Non-contrast chest CT can be considered in 12 months if patient is high-risk. This recommendation follows the consensus statement: Guidelines for Management of Incidental Pulmonary Nodules Detected  on CT Images: From the Fleischner Society 2017; Radiology 2017; 284:228-243. 3. Mild hepatic steatosis. 4. 7 mm interpolar right renal cyst. Electronically Signed   By: Ashley Royalty M.D.   On: 04/28/2018 00:21    Orson Eva, DO  [facility] Pager [phone]  If 7PM-7AM, please contact night-coverage [email or web address] Password [email or web address] 04/28/2018, 8:55 AM   LOS: 0 days

## 2018-04-28 NOTE — Consult Note (Signed)
Referring Provider: No ref. provider found Primary Care Physician:  [name], MD Primary Gastroenterologist:  Dr  Oneida Alar  Reason for Consultation:  Small bowel obstruction.  HPI: 62 year old lady with chronic constipation reducing chronic abdominal pain more recently well managed with Linzess 290.  Presented to the ED with a 24-hour history of postprandial abdominal pain, nausea and vomiting. Evaluation included CT which revealed suggestive of partial small bowel obstruction  distal jejunum  / proximal ileum. She's been admitted for further evaluation. She has been seen by general surgery.  Resented similarly in 2017. CT demonstrated findings of a partial SBO. However, with recurrent abdominal pain since that time she's had 2 CT's which did not reveal any such abnormality. History of PAD. She continues to smoke.  Mesenteric vasculature appears grossly patent on the most recent CT.  History of open cholecystectomy and hysterectomy.  Last colonoscopy March of this year-simple adenomas removed.  Temp spike of 102 just now for which blood cultures have been ordered.  She denies melena, hematochezia or hematemesis.  Past Medical History:  Diagnosis Date  . Chronic back pain   . Constipation   . COPD (chronic obstructive pulmonary disease) ([place])   . DVT (deep venous thrombosis) ([place])   . GERD (gastroesophageal reflux disease)   . Headache   . Hyperlipidemia   . Hypertension   . Normal cardiac stress test 12/2014    Past Surgical History:  Procedure Laterality Date  . ABDOMINAL AORTOGRAM W/LOWER EXTREMITY N/A 10/26/2017   Procedure: ABDOMINAL AORTOGRAM W/LOWER EXTREMITY;  Surgeon: Elam Dutch, MD;  Location: [place] [facility] [facility];  Service: Cardiovascular;  Laterality: N/A;  . ABDOMINAL HYSTERECTOMY    . CHOLECYSTECTOMY    . COLONOSCOPY  2007   Dr. Laural Golden: small internal hemorrhoids, otherwise normal. exam could be compromised due to quality of prep  . COLONOSCOPY N/A  02/15/2018   Dr. Oneida Alar: 2 simple adenomas, external and internal hemorrhoids. Next colonoscopy 2024-2026    Prior to Admission medications   Medication Sig Start Date End Date Taking? Authorizing Provider  albuterol (PROVENTIL HFA;VENTOLIN HFA) 108 (90 Base) MCG/ACT inhaler Inhale 2 puffs into the lungs every 4 (four) hours as needed for wheezing or shortness of breath. 03/01/18   Francine Graven, DO  amLODipine (NORVASC) 5 MG tablet Take 5 mg by mouth daily. 09/11/16   [provider]  atorvastatin (LIPITOR) 20 MG tablet Take 1 tablet (20 mg total) by mouth daily. 12/23/14   Fay Records, MD  dexamethasone (DECADRON) 4 MG tablet Take 1 tablet (4 mg total) by mouth 2 (two) times daily with a meal. 02/17/18   Lily Kocher, PA-C  linaclotide [facility]) 290 MCG CAPS capsule Take 1 capsule (290 mcg total) by mouth daily before breakfast. 12/10/17   Rolland Porter, MD    Current Facility-Administered Medications  Medication Dose Route Frequency Provider Last Rate Last Dose  . 0.45 % NaCl with KCl 20 mEq / L infusion   Intravenous Continuous Orson Eva, MD 75 mL/hr at 04/28/18 1009    . acetaminophen (TYLENOL) tablet 650 mg  650 mg Oral Q6H PRN Reubin Milan, MD   650 mg at 04/28/18 1315   Or  . acetaminophen (TYLENOL) suppository 650 mg  650 mg Rectal Q6H PRN Reubin Milan, MD      . enoxaparin (LOVENOX) injection 40 mg  40 mg Subcutaneous Q24H Reubin Milan, MD   40 mg at 04/28/18 0254  . famotidine (PEPCID) IVPB 20 mg  premix  20 mg Intravenous Q12H Reubin Milan, MD   Stopped at 04/28/18 [phone]  . HYDROmorphone (DILAUDID) injection 1 mg  1 mg Intravenous Q4H PRN Reubin Milan, MD   1 mg at 04/28/18 1013  . ondansetron (ZOFRAN) tablet 4 mg  4 mg Oral Q6H PRN Reubin Milan, MD       Or  . ondansetron [facility]) injection 4 mg  4 mg Intravenous Q6H PRN Reubin Milan, MD        Allergies as of 04/27/2018  . (No Known Allergies)    Family History    Problem Relation Age of Onset  . Hypertension Mother   . Colon cancer Neg Hx   . Colon polyps Neg Hx     Social History   Socioeconomic History  . Marital status: Widowed    Spouse name: Not on file  . Number of children: Not on file  . Years of education: Not on file  . Highest education level: Not on file  Occupational History  . Occupation: unemployed  Social Needs  . Financial resource strain: Not on file  . Food insecurity:    Worry: Not on file    Inability: Not on file  . Transportation needs:    Medical: Not on file    Non-medical: Not on file  Tobacco Use  . Smoking status: Current Every Day Smoker    Packs/day: 0.50    Years: 40.00    Pack years: 20.00    Types: Cigarettes  . Smokeless tobacco: Never Used  Substance and Sexual Activity  . Alcohol use: No    Alcohol/week: 0.0 oz  . Drug use: No  . Sexual activity: Not Currently  Lifestyle  . Physical activity:    Days per week: Not on file    Minutes per session: Not on file  . Stress: Not on file  Relationships  . Social connections:    Talks on phone: Not on file    Gets together: Not on file    Attends religious service: Not on file    Active member of club or organization: Not on file    Attends meetings of clubs or organizations: Not on file    Relationship status: Not on file  . Intimate partner violence:    Fear of current or ex partner: Not on file    Emotionally abused: Not on file    Physically abused: Not on file    Forced sexual activity: Not on file  Other Topics Concern  . Not on file  Social History Narrative  . Not on file    Review of Systems:  As in history of present illness.  Physical Exam: Vital signs in last 24 hours: Temp:  [98.2 F (36.8 C)-102.4 F (39.1 C)] 102.4 F (39.1 C) (05/12 1310) Pulse Rate:  [60-89] 82 (05/12 1316) Resp:  [13-20] 16 (05/12 1310) BP: (111-135)/(63-77) 119/63 (05/12 1310) SpO2:  [79 %-99 %] 94 % (05/12 1316) Weight:  [133 lb (60.3  kg)-133 lb 4.8 oz (60.5 kg)] 133 lb 4.8 oz (60.5 kg) (05/12 0219) Last BM Date: 04/26/18 General:   Alert,   pleasant and cooperative in NAD Lungs:  Clear throughout to auscultation.   No wheezes, crackles, or rhonchi. No acute distress. Heart:  Regular rate and rhythm; no murmurs, clicks, rubs,  or gallops. Abdomen:  Somewhat distended. Midline surgical scar. Positive bowel sounds increased tympany. Mild diffuse tenderness without obvious mass. Intake/Output from previous day: 05/11 0701 -  05/12 0700 In: 731.3 [I.V.:281.3; IV Piggyback:450] Out: 250 [Emesis/NG output:250] Intake/Output this shift: No intake/output data recorded.  Lab Results: Recent Labs    04/27/18 2156 04/28/18 0704  WBC 12.2* 12.4*  HGB 15.3* 12.8  HCT 47.6* 42.0  PLT 224 218   BMET Recent Labs    04/27/18 2156 04/28/18 0704  NA 147* 139  K 2.9* 3.9  CL 104 104  CO2 30 28  GLUCOSE 114* 139*  BUN 9 8  CREATININE 0.76 0.69  CALCIUM 9.6 8.2*   LFT Recent Labs    04/27/18 2156  PROT 7.8  ALBUMIN 4.3  AST 13*  ALT 15  ALKPHOS 91  BILITOT 0.8   LOS: 0 days   Impression:  Pleasant 62 year old lady with chronic abdominal pain, constipation, PAD admitted with what appears to be a partial small bowel obstruction. Similar presentation 2 years ago. Small bowel appeared unremarkable on intervening CTs.  I suspect we may be dealing with adhesive disease more than anything else.  I doubt this is infectious process or inflammatory bowel disease.  Mesenteric vasculature appears grossly patent. Chronic constipation being managed fairly well with Linzess recently.  I have not been able to reach the radiologist in [place] to review the CT scan today.  Recommendations:  As suggested by Dr. Constance Haw, keep her nothing by mouth. CT films need to be scrutinized further.   Continue PPI for GERD.  A CTE may be helpful in further evaluating as to the etiology of the obstruction.  Further recommendations to  follow.        Manus Rudd  04/28/2018, 1:50 PM    Notice:  This dictation was prepared with Dragon dictation along with smaller phrase technology. Any transcriptional errors that result from this process are unintentional and may not be corrected upon review.

## 2018-04-28 NOTE — Progress Notes (Signed)
Full Note Consult to Follow   pSBO with hard BM yesterday, some flatus today. Improved pain, no more nausea/vomiting. Wanting some ice.   Some of the bowel is looking thickened, unknown etiology. Will review with radiology tomorrow.  NPO, hold on NG unless vomits. Ice for comfort. Can do clears if has BM.   Curlene Labrum, MD [facility] [address] [place],  [address] [phone] (office)

## 2018-04-28 NOTE — Consult Note (Signed)
[facility] Consult  Reason for Consult: SBO  Referring Physician: Dr. Olevia Bowens Dr. Carles Collet    Virginia Washington is a 62 y.o. female.  HPI: Virginia Washington is a 62 yo with a history of an open cholecystectomy, hysterectomy who presented with abdominal pain, nausea for the past day. She had a hard BM yesterday AM but normally has soft BMs twice daily.  She reported having a prior history of SBO with NG placement in 2017 that resolved. She has no history of any viral illness, IBD, Crohn's.  She says that her pain is improved now and she is having some flatus.   Past Medical History:  Diagnosis Date  . Chronic back pain   . Constipation   . COPD (chronic obstructive pulmonary disease) ([place])   . DVT (deep venous thrombosis) ([place])   . GERD (gastroesophageal reflux disease)   . Headache   . Hyperlipidemia   . Hypertension   . Normal cardiac stress test 12/2014    Past Surgical History:  Procedure Laterality Date  . ABDOMINAL AORTOGRAM W/LOWER EXTREMITY N/A 10/26/2017   Procedure: ABDOMINAL AORTOGRAM W/LOWER EXTREMITY;  Surgeon: Elam Dutch, MD;  Location: [facility];  Service: Cardiovascular;  Laterality: N/A;  . ABDOMINAL HYSTERECTOMY    . CHOLECYSTECTOMY    . COLONOSCOPY  2007   Dr. Laural Golden: small internal hemorrhoids, otherwise normal. exam could be compromised due to quality of prep  . COLONOSCOPY N/A 02/15/2018   Dr. Oneida Alar: 2 simple adenomas, external and internal hemorrhoids. Next colonoscopy 2024-2026    Family History  Problem Relation Age of Onset  . Hypertension Mother   . Colon cancer Neg Hx   . Colon polyps Neg Hx     Social History   Tobacco Use  . Smoking status: Current Every Day Smoker    Packs/day: 0.50    Years: 40.00    Pack years: 20.00    Types: Cigarettes  . Smokeless tobacco: Never Used  Substance Use Topics  . Alcohol use: No    Alcohol/week: 0.0 oz  . Drug use: No    Medications:  I have reviewed the patient's current  medications. Prior to Admission:  Medications Prior to Admission  Medication Sig Dispense Refill Last Dose  . albuterol (PROVENTIL HFA;VENTOLIN HFA) 108 (90 Base) MCG/ACT inhaler Inhale 2 puffs into the lungs every 4 (four) hours as needed for wheezing or shortness of breath. 1 Inhaler 0 Taking  . amLODipine (NORVASC) 5 MG tablet Take 5 mg by mouth daily.   Taking  . atorvastatin (LIPITOR) 20 MG tablet Take 1 tablet (20 mg total) by mouth daily. 90 tablet 3 Taking  . dexamethasone (DECADRON) 4 MG tablet Take 1 tablet (4 mg total) by mouth 2 (two) times daily with a meal. 10 tablet 0 Taking  . linaclotide (LINZESS) 290 MCG CAPS capsule Take 1 capsule (290 mcg total) by mouth daily before breakfast. 30 capsule 0 Taking   Scheduled: . enoxaparin (LOVENOX) injection  40 mg Subcutaneous Q24H   Continuous: . 0.45 % NaCl with KCl 20 mEq / L    . famotidine (PEPCID) IV Stopped (04/28/18 0708)   ZOX:[id number] **OR** acetaminophen, HYDROmorphone (DILAUDID) injection, ondansetron **OR** ondansetron (ZOFRAN) IV  Allergies: No Known Allergies  ROS:  A comprehensive review of systems was negative except for: Gastrointestinal: positive for abdominal pain and nausea Last BM yesterday, was hard   Blood pressure 128/75, pulse 89, temperature 99.6 F (37.6 C), temperature source Oral, resp. rate 13,  height 5' 2"  (1.575 m), weight 133 lb 4.8 oz (60.5 kg), SpO2 96 %. Physical Exam  Constitutional: She is oriented to person, place, and time. She appears well-developed and well-nourished.  HENT:  Head: Normocephalic.  Eyes: Pupils are equal, round, and reactive to light.  Neck: Normal range of motion.  Cardiovascular: Normal rate.  Pulmonary/Chest: Effort normal.  Abdominal: Soft. She exhibits distension. There is no tenderness. There is no rebound and no guarding. No hernia.  Musculoskeletal: Normal range of motion. She exhibits no edema.  Neurological: She is alert and oriented to person, place,  and time.  Skin: Skin is warm and dry.  Psychiatric: She has a normal mood and affect. Her behavior is normal. Judgment and thought content normal.  Vitals reviewed.   Results: Results for orders placed or performed during the hospital encounter of 04/27/18 (from the past 48 hour(s))  CBC with Differential     Status: Abnormal   Collection Time: 04/27/18  9:56 PM  Result Value Ref Range   WBC 12.2 (H) 4.0 - 10.5 K/uL   RBC 5.37 (H) 3.87 - 5.11 MIL/uL   Hemoglobin 15.3 (H) 12.0 - 15.0 g/dL   HCT 47.6 (H) 36.0 - 46.0 %   MCV 88.6 78.0 - 100.0 fL   MCH 28.5 26.0 - 34.0 pg   MCHC 32.1 30.0 - 36.0 g/dL   RDW 15.4 11.5 - 15.5 %   Platelets 224 150 - 400 K/uL   Neutrophils Relative % 69 %   Neutro Abs 8.4 (H) 1.7 - 7.7 K/uL   Lymphocytes Relative 24 %   Lymphs Abs 2.9 0.7 - 4.0 K/uL   Monocytes Relative 7 %   Monocytes Absolute 0.9 0.1 - 1.0 K/uL   Eosinophils Relative 0 %   Eosinophils Absolute 0.0 0.0 - 0.7 K/uL   Basophils Relative 0 %   Basophils Absolute 0.0 0.0 - 0.1 K/uL    Comment: Performed at [facility], [address], [place], [place] [address]  Comprehensive metabolic panel     Status: Abnormal   Collection Time: 04/27/18  9:56 PM  Result Value Ref Range   Sodium 147 (H) 135 - 145 mmol/L   Potassium 2.9 (L) 3.5 - 5.1 mmol/L   Chloride 104 101 - 111 mmol/L   CO2 30 22 - 32 mmol/L   Glucose, Bld 114 (H) 65 - 99 mg/dL   BUN 9 6 - 20 mg/dL   Creatinine, Ser 0.76 0.44 - 1.00 mg/dL   Calcium 9.6 8.9 - 10.3 mg/dL   Total Protein 7.8 6.5 - 8.1 g/dL   Albumin 4.3 3.5 - 5.0 g/dL   AST 13 (L) 15 - 41 U/L   ALT 15 14 - 54 U/L   Alkaline Phosphatase 91 38 - 126 U/L   Total Bilirubin 0.8 0.3 - 1.2 mg/dL   GFR calc non Af Amer >60 >60 mL/min   GFR calc Af Amer >60 >60 mL/min    Comment: (NOTE) The eGFR has been calculated using the CKD EPI equation. This calculation has not been validated in all clinical situations. eGFR's persistently <60 mL/min signify possible Chronic  Kidney Disease.    Anion gap 13 5 - 15    Comment: Performed at [facility], [address], [place], [place] [address]  Lipase, blood     Status: None   Collection Time: 04/27/18  9:56 PM  Result Value Ref Range   Lipase 38 11 - 51 U/L    Comment: Performed at  [address] [place], [place] [address]  Magnesium     Status: None   Collection Time: 04/27/18  9:56 PM  Result Value Ref Range   Magnesium 1.8 1.7 - 2.4 mg/dL    Comment: Performed at [facility], [address] [place], [place] [address]  Phosphorus     Status: None   Collection Time: 04/27/18  9:56 PM  Result Value Ref Range   Phosphorus 2.8 2.5 - 4.6 mg/dL    Comment: Performed at [facility], [address], [place], [place] [address]  CBC WITH DIFFERENTIAL     Status: Abnormal   Collection Time: 04/28/18  7:04 AM  Result Value Ref Range   WBC 12.4 (H) 4.0 - 10.5 K/uL   RBC 4.65 3.87 - 5.11 MIL/uL   Hemoglobin 12.8 12.0 - 15.0 g/dL   HCT 42.0 36.0 - 46.0 %   MCV 90.3 78.0 - 100.0 fL   MCH 27.5 26.0 - 34.0 pg   MCHC 30.5 30.0 - 36.0 g/dL   RDW 15.9 (H) 11.5 - 15.5 %   Platelets 218 150 - 400 K/uL   Neutrophils Relative % 77 %   Neutro Abs 9.5 (H) 1.7 - 7.7 K/uL   Lymphocytes Relative 17 %   Lymphs Abs 2.2 0.7 - 4.0 K/uL   Monocytes Relative 6 %   Monocytes Absolute 0.8 0.1 - 1.0 K/uL   Eosinophils Relative 0 %   Eosinophils Absolute 0.0 0.0 - 0.7 K/uL   Basophils Relative 0 %   Basophils Absolute 0.0 0.0 - 0.1 K/uL    Comment: Performed at [facility], [address], [place], [place] [address]  Basic metabolic panel     Status: Abnormal   Collection Time: 04/28/18  7:04 AM  Result Value Ref Range   Sodium 139 135 - 145 mmol/L    Comment: DELTA CHECK NOTED   Potassium 3.9 3.5 - 5.1 mmol/L    Comment: DELTA CHECK NOTED   Chloride 104 101 - 111 mmol/L   CO2 28 22 - 32 mmol/L   Glucose, Bld 139 (H) 65 - 99 mg/dL   BUN 8 6 - 20 mg/dL   Creatinine, Ser 0.69 0.44 - 1.00 mg/dL   Calcium 8.2  (L) 8.9 - 10.3 mg/dL   GFR calc non Af Amer >60 >60 mL/min   GFR calc Af Amer >60 >60 mL/min    Comment: (NOTE) The eGFR has been calculated using the CKD EPI equation. This calculation has not been validated in all clinical situations. eGFR's persistently <60 mL/min signify possible Chronic Kidney Disease.    Anion gap 7 5 - 15    Comment: Performed at [facility], [address] [place],  [address]   Personally reviewed CT- SB with some fecalized bowel proximal to thickened bowel, unsure if bowel thick due to decompression or from other pathology, no stranding or free air/ free fluid; reviewed old CT from 2017, don't really see same thickening of the then  Ct Abdomen Pelvis W Contrast  Result Date: 04/28/2018 CLINICAL DATA:  Sharp umbilical pain and cramping starting today. History of small bowel obstructions in the past. EXAM: CT ABDOMEN AND PELVIS WITH CONTRAST TECHNIQUE: Multidetector CT imaging of the abdomen and pelvis was performed using the standard protocol following bolus administration of intravenous contrast. CONTRAST:  145m ISOVUE-300 IOPAMIDOL (ISOVUE-300) INJECTION 61% COMPARISON:  03/01/2018 and priors dating back through 08/16/2016 FINDINGS: Lower chest: Bibasilar dependent atelectasis with scattered centrilobular emphysema. Tiny nodular densities  at the right lung base, one along the minor fissure likely represents minimal focal pleural thickening or more likely an intrafissural lymph node. This measures up to 3.8 mm. An additional 5 mm nodular density is seen in the right lower lobe anteriorly but is only partially included in could represent a tiny pulmonary nodule or more likely branch point for pulmonary vessel given lack of similar finding on recent comparison. No pleural effusion. Heart size is top-normal. Hepatobiliary: Cholecystectomy. Mild hepatic steatosis. No biliary dilatation or mass. Pancreas: Normal Spleen: Normal Adrenals/Urinary Tract: Normal bilateral  adrenal glands. 7 mm interpolar right renal cyst. No nephrolithiasis nor obstructive uropathy. Unremarkable urinary bladder without focal mural thickening or calculus. Stomach/Bowel: Small hiatal hernia. The stomach is otherwise unremarkable. Normal small bowel rotation. The duodenum is decompressed as are loops of proximal and mid jejunum. Fluid-filled dilatation of mid to distal jejunum is identified measuring up to 2.6 cm in the left lower quadrant and mid abdomen. This may be secondary to what appears to be segmental inflammatory thickening of distal jejunum/proximal ileum, series 2/56. Findings are similar to that seen in 2017. The terminal and distal ileum are decompressed and noninflamed. The colon is unremarkable. Surgical clips are seen in the left hemipelvis. Vascular/Lymphatic: No enlarged lymph nodes.  No aortic aneurysm. Reproductive: Status post hysterectomy. No adnexal masses. Other: No free air nor free fluid. Musculoskeletal: No acute or significant osseous findings. IMPRESSION: 1. Early or partial SBO secondary to what appears to be inflammatory segmental narrowing of distal jejunum/proximal ileum. Findings are suspicious for changes of inflammatory bowel. The appearance is similar to that of prior 2017 exam. 2. Tiny nodular densities in the right lower lobe measuring up to 5 mm. One appears to be within the fissure likely represents an intra fissural lymph node measuring 3.8 mm. No follow-up needed if patient is low-risk (and has no known or suspected primary neoplasm). Non-contrast chest CT can be considered in 12 months if patient is high-risk. This recommendation follows the consensus statement: Guidelines for Management of Incidental Pulmonary Nodules Detected on CT Images: From the Fleischner Society 2017; Radiology 2017; 284:228-243. 3. Mild hepatic steatosis. 4. 7 mm interpolar right renal cyst. Electronically Signed   By: Ashley Royalty M.D.   On: 04/28/2018 00:21     Assessment & Plan:   Virginia Washington is a 62 y.o. female with pSBO who is having some flatus and a hard BM yesterday AM. Says her pain is better. Remains distended.   -NPO, ice for comfort -NG if vomits -If has BM can have clears -Will review CT with radiology tomorrow regarding the thickening of the bowel  -WBC slightly up but no shift, monitor  -Lytes replaced to K 4, Phos 3, Mag 2 Updated Dr. Carles Collet.   All questions were answered to the satisfaction of the patient.   Virl Cagey 04/28/2018, 10:05 AM

## 2018-04-29 DIAGNOSIS — Z79899 Other long term (current) drug therapy: Secondary | ICD-10-CM | POA: Diagnosis not present

## 2018-04-29 DIAGNOSIS — F17210 Nicotine dependence, cigarettes, uncomplicated: Secondary | ICD-10-CM | POA: Diagnosis present

## 2018-04-29 DIAGNOSIS — K56600 Partial intestinal obstruction, unspecified as to cause: Secondary | ICD-10-CM | POA: Diagnosis not present

## 2018-04-29 DIAGNOSIS — K56609 Unspecified intestinal obstruction, unspecified as to partial versus complete obstruction: Secondary | ICD-10-CM | POA: Diagnosis not present

## 2018-04-29 DIAGNOSIS — Z90710 Acquired absence of both cervix and uterus: Secondary | ICD-10-CM | POA: Diagnosis not present

## 2018-04-29 DIAGNOSIS — J189 Pneumonia, unspecified organism: Secondary | ICD-10-CM | POA: Diagnosis not present

## 2018-04-29 DIAGNOSIS — I10 Essential (primary) hypertension: Secondary | ICD-10-CM | POA: Diagnosis not present

## 2018-04-29 DIAGNOSIS — R109 Unspecified abdominal pain: Secondary | ICD-10-CM | POA: Diagnosis present

## 2018-04-29 DIAGNOSIS — J9601 Acute respiratory failure with hypoxia: Secondary | ICD-10-CM | POA: Diagnosis not present

## 2018-04-29 DIAGNOSIS — K5909 Other constipation: Secondary | ICD-10-CM | POA: Diagnosis present

## 2018-04-29 DIAGNOSIS — E785 Hyperlipidemia, unspecified: Secondary | ICD-10-CM | POA: Diagnosis not present

## 2018-04-29 DIAGNOSIS — Z7952 Long term (current) use of systemic steroids: Secondary | ICD-10-CM | POA: Diagnosis not present

## 2018-04-29 DIAGNOSIS — R6881 Early satiety: Secondary | ICD-10-CM | POA: Diagnosis present

## 2018-04-29 DIAGNOSIS — E876 Hypokalemia: Secondary | ICD-10-CM | POA: Diagnosis not present

## 2018-04-29 DIAGNOSIS — E782 Mixed hyperlipidemia: Secondary | ICD-10-CM | POA: Diagnosis not present

## 2018-04-29 DIAGNOSIS — E870 Hyperosmolality and hypernatremia: Secondary | ICD-10-CM | POA: Diagnosis not present

## 2018-04-29 DIAGNOSIS — Z86718 Personal history of other venous thrombosis and embolism: Secondary | ICD-10-CM | POA: Diagnosis not present

## 2018-04-29 DIAGNOSIS — K219 Gastro-esophageal reflux disease without esophagitis: Secondary | ICD-10-CM | POA: Diagnosis present

## 2018-04-29 DIAGNOSIS — G8929 Other chronic pain: Secondary | ICD-10-CM | POA: Diagnosis not present

## 2018-04-29 DIAGNOSIS — Z8249 Family history of ischemic heart disease and other diseases of the circulatory system: Secondary | ICD-10-CM | POA: Diagnosis not present

## 2018-04-29 DIAGNOSIS — Z720 Tobacco use: Secondary | ICD-10-CM | POA: Diagnosis not present

## 2018-04-29 DIAGNOSIS — K529 Noninfective gastroenteritis and colitis, unspecified: Secondary | ICD-10-CM | POA: Diagnosis not present

## 2018-04-29 DIAGNOSIS — R911 Solitary pulmonary nodule: Secondary | ICD-10-CM | POA: Diagnosis not present

## 2018-04-29 DIAGNOSIS — J440 Chronic obstructive pulmonary disease with acute lower respiratory infection: Secondary | ICD-10-CM | POA: Diagnosis not present

## 2018-04-29 LAB — BASIC METABOLIC PANEL
Anion gap: 9 (ref 5–15)
BUN: 10 mg/dL (ref 6–20)
CHLORIDE: 105 mmol/L (ref 101–111)
CO2: 25 mmol/L (ref 22–32)
CREATININE: 0.88 mg/dL (ref 0.44–1.00)
Calcium: 8.1 mg/dL — ABNORMAL LOW (ref 8.9–10.3)
GFR calc non Af Amer: >60 mL/min (ref >60)
Glucose, Bld: 69 mg/dL (ref 65–99)
Potassium: 3.7 mmol/L (ref 3.5–5.1)
Sodium: 139 mmol/L (ref 135–145)

## 2018-04-29 LAB — CBC WITH DIFFERENTIAL/PLATELET
BASOS PCT: 0 %
Basophils Absolute: 0 10*3/uL (ref 0.0–0.1)
EOS PCT: 0 %
Eosinophils Absolute: 0 10*3/uL (ref 0.0–0.7)
HEMATOCRIT: 41 % (ref 36.0–46.0)
Hemoglobin: 12.4 g/dL (ref 12.0–15.0)
Lymphocytes Relative: 28 %
Lymphs Abs: 3.1 10*3/uL (ref 0.7–4.0)
MCH: 27.6 pg (ref 26.0–34.0)
MCHC: 30.2 g/dL (ref 30.0–36.0)
MCV: 91.1 fL (ref 78.0–100.0)
MONO ABS: 0.9 10*3/uL (ref 0.1–1.0)
MONOS PCT: 8 %
NEUTROS ABS: 6.9 10*3/uL (ref 1.7–7.7)
Neutrophils Relative %: 64 %
Platelets: 211 10*3/uL (ref 150–400)
RBC: 4.5 MIL/uL (ref 3.87–5.11)
RDW: 15.8 % — ABNORMAL HIGH (ref 11.5–15.5)
WBC: 11 10*3/uL — ABNORMAL HIGH (ref 4.0–10.5)

## 2018-04-29 LAB — MAGNESIUM: Magnesium: 2.1 mg/dL (ref 1.7–2.4)

## 2018-04-29 NOTE — Progress Notes (Signed)
PROGRESS NOTE  Virginia Washington KGU:[id number] DOB: [birth year] DOA: 04/27/2018 PCP: [name], MD  Brief History:  62 year old female with a history of COPD, hyperlipidemia, hypertension, constipation presenting with abdominal pain, distention, and nausea vomiting that began on the evening of 04/27/2018.  The patient states that she had a bowel obstruction in 2017, but does not recall the circumstances or etiology.  Her last bowel movement was on 04/26/2018.  She denies any fevers, chills, diarrhea, hematochezia, melena.  She denies any NSAIDs.  She denies any recent travels or new medications.  Interestingly, the patient relates postprandial abdominal pain for the past 1 to 2 years.  She also describes early satiety.  She states that her abdominal pain usually occurs 5 minutes after eating.  She is due to have an EGD on 05/09/2018.  CT of the abdomen and pelvis at the time of admission showed fluid-filled and dilated jejunum up to 2.6 cm with a segmental inflammatory thickening of the distal jejunum/proximal ileum.  The terminal ileum and distal ileum were decompressed without inflammatory changes.  The patient was noted to have sodium 147, potassium 2.9, WBC 12.2 at the time of admission.  General surgery and GI were consulted to assist with management.  In the afternoon of 04/28/2018, the patient developed a fever of 102.4 F and hypoxia with oxygen saturation 84% on room air..  Blood cultures x2 sets was obtained.  The patient subsequently endorsed that she has had coughing and congestion for the past 2 weeks.  She saw her primary care provider 1 to 2 weeks prior to this admission, and she stated that she was started on "cough medicine".  She has also been complaining of some mild shortness of breath.  She continues to smoke 1/2 pack/day.  Assessment/Plan: PSBO -04/27/2018 CT abdomen and pelvis--early or partial SBO secondary to inflammatory segmental narrowing of the distal  jejunum/proximal ileum -Appreciate general surgery consult -Case discussed with Dr. Constance Haw -consult GI--appreciated-->needs CT enterography as out pt -diet advanced to clears -IV fluids -Judicious pain control -d/c linzess  Acute respiratory failure with hypoxia -secondary to pneumonia -Stable on 2 L nasal cannula -personally reviewed chest x-ray--opacity on lateral view  Fever -secondary to enteritis vs PNA -blood cultures x 2--neg -UA --neg for pyuria -04/28/18--CXR--opacity on lateral view -procalcitonin<0.10 -lactic acid--0.80 -start empiric zosyn--pending blood culture data  Hypernatremia -Continue hypotonic fluid-->resolved -A.m. BMP  Pulmonary nodule -Incidental finding on CT -Outpatient surveillance  Essential hypertension -Holding amlodipine until able to tolerate po -Hydralazine prn SBP 180  Hyperlipidemia -Restart statin once able to tolerate po  Tobacco abuse -I have discussed tobacco cessation with the patient.  I have counseled the patient regarding the negative impacts of continued tobacco use including but not limited to lung cancer, COPD, and cardiovascular disease.  I have discussed alternatives to tobacco and modalities that may help facilitate tobacco cessation including but not limited to biofeedback, hypnosis, and medications.  Total time spent with tobacco counseling was 4 minutes.  Hypokalemia -replete -check mag--2.1   Disposition Plan:   Home 04/30/18 if cleared by surgery  Family Communication:  Daughter upated at bedside--Total time spent 35 minutes.  Greater than 50% spent face to face counseling and coordinating care.    Consultants:  GI, general surgery  Code Status:  FULL  DVT Prophylaxis:  [place] Lovenox   Procedures: As Listed in Progress Note Above  Antibiotics: Zosyn 5/12>>>       Subjective: Patient  is feeling better today.  She feels hungry.  She is breathing better.  She continues to have a cough  but denies any hemoptysis.  She denies any fevers, chills, chest pain, abdominal pain, nausea, vomiting, diarrhea.  She had a bowel movement today.  Objective: Vitals:   04/28/18 1316 04/28/18 2126 04/29/18 0531 04/29/18 1438  BP:  (!) 101/54 127/61 (!) 141/75  Pulse: 82 65 73 69  Resp:  16 16 18   Temp:  98.6 F (37 C) 99.7 F (37.6 C) 98.7 F (37.1 C)  TempSrc:  Oral Oral Oral  SpO2: 94% 94% 94% 96%  Weight:      Height:        Intake/Output Summary (Last 24 hours) at 04/29/2018 1716 Last data filed at 04/29/2018 1508 Gross per 24 hour  Intake 2390 ml  Output 350 ml  Net 2040 ml   Weight change:  Exam:   General:  Pt is alert, follows commands appropriately, not in acute distress  HEENT: No icterus, No thrush, No neck mass, [place]/AT  Cardiovascular: RRR, S1/S2, no rubs, no gallops  Respiratory: Bibasilar crackles, right greater than left.  No wheezing.  Good air movement.  Abdomen: Soft/+BS, non tender, non distended, no guarding  Extremities: No edema, No lymphangitis, No petechiae, No rashes, no synovitis   Data Reviewed: I have personally reviewed following labs and imaging studies Basic Metabolic Panel: Recent Labs  Lab 04/27/18 2156 04/28/18 0704 04/29/18 0623  NA 147* 139 139  K 2.9* 3.9 3.7  CL 104 104 105  CO2 30 28 25   GLUCOSE 114* 139* 69  BUN 9 8 10   CREATININE 0.76 0.69 0.88  CALCIUM 9.6 8.2* 8.1*  MG 1.8  --  2.1  PHOS 2.8  --   --    Liver Function Tests: Recent Labs  Lab 04/27/18 2156  AST 13*  ALT 15  ALKPHOS 91  BILITOT 0.8  PROT 7.8  ALBUMIN 4.3   Recent Labs  Lab 04/27/18 2156  LIPASE 38   No results for input(s): AMMONIA in the last 168 hours. Coagulation Profile: No results for input(s): INR, PROTIME in the last 168 hours. CBC: Recent Labs  Lab 04/27/18 2156 04/28/18 0704 04/29/18 0623  WBC 12.2* 12.4* 11.0*  NEUTROABS 8.4* 9.5* 6.9  HGB 15.3* 12.8 12.4  HCT 47.6* 42.0 41.0  MCV 88.6 90.3 91.1  PLT 224 218 211    Cardiac Enzymes: No results for input(s): CKTOTAL, CKMB, CKMBINDEX, TROPONINI in the last 168 hours. BNP: Invalid input(s): POCBNP CBG: No results for input(s): GLUCAP in the last 168 hours. HbA1C: No results for input(s): HGBA1C in the last 72 hours. Urine analysis:    Component Value Date/Time   COLORURINE STRAW (A) 04/28/2018 2055   APPEARANCEUR CLEAR 04/28/2018 2055   LABSPEC 1.005 04/28/2018 2055   PHURINE 6.0 04/28/2018 2055   GLUCOSEU NEGATIVE 04/28/2018 2055   HGBUR SMALL (A) 04/28/2018 2055   BILIRUBINUR NEGATIVE 04/28/2018 2055   KETONESUR NEGATIVE 04/28/2018 2055   PROTEINUR NEGATIVE 04/28/2018 2055   UROBILINOGEN 0.2 09/19/2015 0928   NITRITE NEGATIVE 04/28/2018 2055   LEUKOCYTESUR NEGATIVE 04/28/2018 2055   Sepsis Labs: @LABRCNTIP (procalcitonin:4,lacticidven:4) ) Recent Results (from the past 240 hour(s))  Culture, blood (Routine X 2) w Reflex to ID Panel     Status: None (Preliminary result)   Collection Time: 04/28/18  2:11 PM  Result Value Ref Range Status   Specimen Description BLOOD RIGHT HAND  Final   Special Requests   Final  BOTTLES DRAWN AEROBIC AND ANAEROBIC Blood Culture adequate volume   Culture   Final    NO GROWTH < 24 HOURS Performed at [facility], [address], [place], [place] [address]    Report Status PENDING  Incomplete  Culture, blood (Routine X 2) w Reflex to ID Panel     Status: None (Preliminary result)   Collection Time: 04/28/18  2:14 PM  Result Value Ref Range Status   Specimen Description BLOOD RIGHT HAND  Final   Special Requests   Final    BOTTLES DRAWN AEROBIC AND ANAEROBIC Blood Culture adequate volume   Culture   Final    NO GROWTH < 24 HOURS Performed at [facility], [address] [place], [place] [address]    Report Status PENDING  Incomplete     Scheduled Meds: . enoxaparin (LOVENOX) injection  40 mg Subcutaneous Q24H   Continuous Infusions: . 0.45 % NaCl with KCl 20 mEq / L 75 mL/hr at 04/29/18 1705    . famotidine (PEPCID) IV Stopped (04/29/18 1025)  . piperacillin-tazobactam (ZOSYN)  IV 3.375 g (04/29/18 1705)    Procedures/Studies: Dg Chest 2 View  Result Date: 04/28/2018 CLINICAL DATA:  Shortness of breath cough and fever. EXAM: CHEST - 2 VIEW COMPARISON:  03/01/2018 and prior radiographs FINDINGS: The cardiomediastinal silhouette is unremarkable. LOWER lobe opacity identified on the LATERAL view may represent atelectasis or airspace disease. No pleural effusion, edema, mass or pneumothorax. No acute bony abnormalities are present. IMPRESSION: LOWER lobe opacity on the LATERAL view which may represent atelectasis or airspace disease/pneumonia. Radiographic follow-up to resolution is recommended. Electronically Signed   By: Margarette Canada M.D.   On: 04/28/2018 17:48   Ct Abdomen Pelvis W Contrast  Result Date: 04/28/2018 CLINICAL DATA:  Sharp umbilical pain and cramping starting today. History of small bowel obstructions in the past. EXAM: CT ABDOMEN AND PELVIS WITH CONTRAST TECHNIQUE: Multidetector CT imaging of the abdomen and pelvis was performed using the standard protocol following bolus administration of intravenous contrast. CONTRAST:  171mL ISOVUE-300 IOPAMIDOL (ISOVUE-300) INJECTION 61% COMPARISON:  03/01/2018 and priors dating back through 08/16/2016 FINDINGS: Lower chest: Bibasilar dependent atelectasis with scattered centrilobular emphysema. Tiny nodular densities at the right lung base, one along the minor fissure likely represents minimal focal pleural thickening or more likely an intrafissural lymph node. This measures up to 3.8 mm. An additional 5 mm nodular density is seen in the right lower lobe anteriorly but is only partially included in could represent a tiny pulmonary nodule or more likely branch point for pulmonary vessel given lack of similar finding on recent comparison. No pleural effusion. Heart size is top-normal. Hepatobiliary: Cholecystectomy. Mild hepatic steatosis. No  biliary dilatation or mass. Pancreas: Normal Spleen: Normal Adrenals/Urinary Tract: Normal bilateral adrenal glands. 7 mm interpolar right renal cyst. No nephrolithiasis nor obstructive uropathy. Unremarkable urinary bladder without focal mural thickening or calculus. Stomach/Bowel: Small hiatal hernia. The stomach is otherwise unremarkable. Normal small bowel rotation. The duodenum is decompressed as are loops of proximal and mid jejunum. Fluid-filled dilatation of mid to distal jejunum is identified measuring up to 2.6 cm in the left lower quadrant and mid abdomen. This may be secondary to what appears to be segmental inflammatory thickening of distal jejunum/proximal ileum, series 2/56. Findings are similar to that seen in 2017. The terminal and distal ileum are decompressed and noninflamed. The colon is unremarkable. Surgical clips are seen in the left hemipelvis. Vascular/Lymphatic: No enlarged lymph nodes.  No aortic aneurysm. Reproductive:  Status post hysterectomy. No adnexal masses. Other: No free air nor free fluid. Musculoskeletal: No acute or significant osseous findings. IMPRESSION: 1. Early or partial SBO secondary to what appears to be inflammatory segmental narrowing of distal jejunum/proximal ileum. Findings are suspicious for changes of inflammatory bowel. The appearance is similar to that of prior 2017 exam. 2. Tiny nodular densities in the right lower lobe measuring up to 5 mm. One appears to be within the fissure likely represents an intra fissural lymph node measuring 3.8 mm. No follow-up needed if patient is low-risk (and has no known or suspected primary neoplasm). Non-contrast chest CT can be considered in 12 months if patient is high-risk. This recommendation follows the consensus statement: Guidelines for Management of Incidental Pulmonary Nodules Detected on CT Images: From the Fleischner Society 2017; Radiology 2017; 284:228-243. 3. Mild hepatic steatosis. 4. 7 mm interpolar right renal  cyst. Electronically Signed   By: Ashley Royalty M.D.   On: 04/28/2018 00:21    Orson Eva, DO  [facility] Pager [phone]  If 7PM-7AM, please contact night-coverage [email or web address] Password [email or web address] 04/29/2018, 5:16 PM   LOS: 0 days

## 2018-04-29 NOTE — Progress Notes (Signed)
[facility] Progress Note     Subjective: Reports flatus and BM but nothing documented. Educated patient on informing RNs. Says pain greatly improved, no nausea/vomiting.   Reviewed CT with Dr. Ardeen Garland radiology, agrees thickened SB distal to the dilated loops with fecalization, similar to prior study in 2017. Unsure of etiology of the thickened bowel ? Enteritis / Crohn's, no obvious vascular compromise, not just a simple adhesion causing the obstruction.   Discussed with Roseanne Kaufman, NP for GI. Also reviewed Ct with radiology.  Agree CT enterography best option for future evaluation of this area given the location.   Objective: Vital signs in last 24 hours: Temp:  [98.6 F (37 C)-99.7 F (37.6 C)] 98.7 F (37.1 C) (05/13 1438) Pulse Rate:  [65-73] 69 (05/13 1438) Resp:  [16-18] 18 (05/13 1438) BP: (101-141)/(54-75) 141/75 (05/13 1438) SpO2:  [94 %-96 %] 96 % (05/13 1438) Last BM Date: 04/26/18  Intake/Output from previous day: 05/12 0701 - 05/13 0700 In: 1763.8 [P.O.:120; I.V.:1493.8; IV Piggyback:150] Out: 350 [Urine:350] Intake/Output this shift: No intake/output data recorded.  General appearance: alert, cooperative and no distress Resp: normal work breathing GI: soft, non-tender; bowel sounds normal; no masses,  no organomegaly  Lab Results:  Recent Labs    04/28/18 0704 04/29/18 0623  WBC 12.4* 11.0*  HGB 12.8 12.4  HCT 42.0 41.0  PLT 218 211   BMET Recent Labs    04/28/18 0704 04/29/18 0623  NA 139 139  K 3.9 3.7  CL 104 105  CO2 28 25  GLUCOSE 139* 69  BUN 8 10  CREATININE 0.69 0.88  CALCIUM 8.2* 8.1*    Studies/Results: Dg Chest 2 View  Result Date: 04/28/2018 CLINICAL DATA:  Shortness of breath cough and fever. EXAM: CHEST - 2 VIEW COMPARISON:  03/01/2018 and prior radiographs FINDINGS: The cardiomediastinal silhouette is unremarkable. LOWER lobe opacity identified on the LATERAL view may represent atelectasis or airspace  disease. No pleural effusion, edema, mass or pneumothorax. No acute bony abnormalities are present. IMPRESSION: LOWER lobe opacity on the LATERAL view which may represent atelectasis or airspace disease/pneumonia. Radiographic follow-up to resolution is recommended. Electronically Signed   By: Margarette Canada M.D.   On: 04/28/2018 17:48   Ct Abdomen Pelvis W Contrast  Result Date: 04/28/2018 CLINICAL DATA:  Sharp umbilical pain and cramping starting today. History of small bowel obstructions in the past. EXAM: CT ABDOMEN AND PELVIS WITH CONTRAST TECHNIQUE: Multidetector CT imaging of the abdomen and pelvis was performed using the standard protocol following bolus administration of intravenous contrast. CONTRAST:  141mL ISOVUE-300 IOPAMIDOL (ISOVUE-300) INJECTION 61% COMPARISON:  03/01/2018 and priors dating back through 08/16/2016 FINDINGS: Lower chest: Bibasilar dependent atelectasis with scattered centrilobular emphysema. Tiny nodular densities at the right lung base, one along the minor fissure likely represents minimal focal pleural thickening or more likely an intrafissural lymph node. This measures up to 3.8 mm. An additional 5 mm nodular density is seen in the right lower lobe anteriorly but is only partially included in could represent a tiny pulmonary nodule or more likely branch point for pulmonary vessel given lack of similar finding on recent comparison. No pleural effusion. Heart size is top-normal. Hepatobiliary: Cholecystectomy. Mild hepatic steatosis. No biliary dilatation or mass. Pancreas: Normal Spleen: Normal Adrenals/Urinary Tract: Normal bilateral adrenal glands. 7 mm interpolar right renal cyst. No nephrolithiasis nor obstructive uropathy. Unremarkable urinary bladder without focal mural thickening or calculus. Stomach/Bowel: Small hiatal hernia. The stomach is otherwise unremarkable. Normal small bowel rotation.  The duodenum is decompressed as are loops of proximal and mid jejunum.  Fluid-filled dilatation of mid to distal jejunum is identified measuring up to 2.6 cm in the left lower quadrant and mid abdomen. This may be secondary to what appears to be segmental inflammatory thickening of distal jejunum/proximal ileum, series 2/56. Findings are similar to that seen in 2017. The terminal and distal ileum are decompressed and noninflamed. The colon is unremarkable. Surgical clips are seen in the left hemipelvis. Vascular/Lymphatic: No enlarged lymph nodes.  No aortic aneurysm. Reproductive: Status post hysterectomy. No adnexal masses. Other: No free air nor free fluid. Musculoskeletal: No acute or significant osseous findings. IMPRESSION: 1. Early or partial SBO secondary to what appears to be inflammatory segmental narrowing of distal jejunum/proximal ileum. Findings are suspicious for changes of inflammatory bowel. The appearance is similar to that of prior 2017 exam. 2. Tiny nodular densities in the right lower lobe measuring up to 5 mm. One appears to be within the fissure likely represents an intra fissural lymph node measuring 3.8 mm. No follow-up needed if patient is low-risk (and has no known or suspected primary neoplasm). Non-contrast chest CT can be considered in 12 months if patient is high-risk. This recommendation follows the consensus statement: Guidelines for Management of Incidental Pulmonary Nodules Detected on CT Images: From the Fleischner Society 2017; Radiology 2017; 284:228-243. 3. Mild hepatic steatosis. 4. 7 mm interpolar right renal cyst. Electronically Signed   By: Ashley Royalty M.D.   On: 04/28/2018 00:21    Anti-infectives: Anti-infectives (From admission, onward)   Start     Dose/Rate Route Frequency Ordered Stop   04/28/18 1800  piperacillin-tazobactam (ZOSYN) IVPB 3.375 g     3.375 g 12.5 mL/hr over 240 Minutes Intravenous Every 8 hours 04/28/18 1702        Assessment/Plan: Ms. Halperin is a 62 yo with a pSBO with thickened small bowel distal to the  obstructed point. She reports flatus and BM and has less distention. Nothing recorded for BM.  -Discussed unsure etiology of the thickened SB and need for future follow up with likely CT enterography to assess that bowel in the future -No utility in doing CT enterography now as Dr. Ardeen Garland feels that the walls are pretty well visualized at this time  -Clear liquids, adv as tolerated -If resolves and goes home, will need follow up with GI and they can order CTE and then refer to me as needed as something is causing this obstruction that is more complex than simple adhesions    LOS: 0 days    Virl Cagey 04/29/2018

## 2018-04-29 NOTE — Progress Notes (Signed)
Discussed with Dr. Constance Haw, who reviewed with radiology. Will pursue early interval CTE as outpatient when she is seen by [facility], then follow-up with surgery depending on findings.

## 2018-04-29 NOTE — Progress Notes (Signed)
Subjective: +flatus, no BM. Abdominal pain resolved. No N/V. States she is hungry. Ice chips.   Objective: Vital signs in last 24 hours: Temp:  [98.6 F (37 C)-102.4 F (39.1 C)] 99.7 F (37.6 C) (05/13 0531) Pulse Rate:  [65-85] 73 (05/13 0531) Resp:  [16] 16 (05/13 0531) BP: (101-127)/(54-63) 127/61 (05/13 0531) SpO2:  [79 %-94 %] 94 % (05/13 0531) Last BM Date: 04/26/18 General:   Alert and oriented, pleasant Head:  Normocephalic and atraumatic. Eyes:  No icterus, sclera clear. Conjuctiva pink.  Abdomen:  Bowel sounds present, soft, non-tender, non-distended. No HSM or hernias noted. No rebound or guarding. No masses appreciated  Extremities:  Without  edema. Neurologic:  Alert and  oriented x4 Psych:  Alert and cooperative. Normal mood and affect.  Intake/Output from previous day: 05/12 0701 - 05/13 0700 In: 1763.8 [P.O.:120; I.V.:1493.8; IV Piggyback:150] Out: 350 [Urine:350] Intake/Output this shift: No intake/output data recorded.  Lab Results: Recent Labs    04/27/18 2156 04/28/18 0704 04/29/18 0623  WBC 12.2* 12.4* 11.0*  HGB 15.3* 12.8 12.4  HCT 47.6* 42.0 41.0  PLT 224 218 211   BMET Recent Labs    04/27/18 2156 04/28/18 0704 04/29/18 0623  NA 147* 139 139  K 2.9* 3.9 3.7  CL 104 104 105  CO2 30 28 25   GLUCOSE 114* 139* 69  BUN 9 8 10   CREATININE 0.76 0.69 0.88  CALCIUM 9.6 8.2* 8.1*   LFT Recent Labs    04/27/18 2156  PROT 7.8  ALBUMIN 4.3  AST 13*  ALT 15  ALKPHOS 91  BILITOT 0.8   Studies/Results: Dg Chest 2 View  Result Date: 04/28/2018 CLINICAL DATA:  Shortness of breath cough and fever. EXAM: CHEST - 2 VIEW COMPARISON:  03/01/2018 and prior radiographs FINDINGS: The cardiomediastinal silhouette is unremarkable. LOWER lobe opacity identified on the LATERAL view may represent atelectasis or airspace disease. No pleural effusion, edema, mass or pneumothorax. No acute bony abnormalities are present. IMPRESSION: LOWER lobe opacity  on the LATERAL view which may represent atelectasis or airspace disease/pneumonia. Radiographic follow-up to resolution is recommended. Electronically Signed   By: Margarette Canada M.D.   On: 04/28/2018 17:48   Ct Abdomen Pelvis W Contrast  Result Date: 04/28/2018 CLINICAL DATA:  Sharp umbilical pain and cramping starting today. History of small bowel obstructions in the past. EXAM: CT ABDOMEN AND PELVIS WITH CONTRAST TECHNIQUE: Multidetector CT imaging of the abdomen and pelvis was performed using the standard protocol following bolus administration of intravenous contrast. CONTRAST:  153mL ISOVUE-300 IOPAMIDOL (ISOVUE-300) INJECTION 61% COMPARISON:  03/01/2018 and priors dating back through 08/16/2016 FINDINGS: Lower chest: Bibasilar dependent atelectasis with scattered centrilobular emphysema. Tiny nodular densities at the right lung base, one along the minor fissure likely represents minimal focal pleural thickening or more likely an intrafissural lymph node. This measures up to 3.8 mm. An additional 5 mm nodular density is seen in the right lower lobe anteriorly but is only partially included in could represent a tiny pulmonary nodule or more likely branch point for pulmonary vessel given lack of similar finding on recent comparison. No pleural effusion. Heart size is top-normal. Hepatobiliary: Cholecystectomy. Mild hepatic steatosis. No biliary dilatation or mass. Pancreas: Normal Spleen: Normal Adrenals/Urinary Tract: Normal bilateral adrenal glands. 7 mm interpolar right renal cyst. No nephrolithiasis nor obstructive uropathy. Unremarkable urinary bladder without focal mural thickening or calculus. Stomach/Bowel: Small hiatal hernia. The stomach is otherwise unremarkable. Normal small bowel rotation. The duodenum is decompressed as  are loops of proximal and mid jejunum. Fluid-filled dilatation of mid to distal jejunum is identified measuring up to 2.6 cm in the left lower quadrant and mid abdomen. This may be  secondary to what appears to be segmental inflammatory thickening of distal jejunum/proximal ileum, series 2/56. Findings are similar to that seen in 2017. The terminal and distal ileum are decompressed and noninflamed. The colon is unremarkable. Surgical clips are seen in the left hemipelvis. Vascular/Lymphatic: No enlarged lymph nodes.  No aortic aneurysm. Reproductive: Status post hysterectomy. No adnexal masses. Other: No free air nor free fluid. Musculoskeletal: No acute or significant osseous findings. IMPRESSION: 1. Early or partial SBO secondary to what appears to be inflammatory segmental narrowing of distal jejunum/proximal ileum. Findings are suspicious for changes of inflammatory bowel. The appearance is similar to that of prior 2017 exam. 2. Tiny nodular densities in the right lower lobe measuring up to 5 mm. One appears to be within the fissure likely represents an intra fissural lymph node measuring 3.8 mm. No follow-up needed if patient is low-risk (and has no known or suspected primary neoplasm). Non-contrast chest CT can be considered in 12 months if patient is high-risk. This recommendation follows the consensus statement: Guidelines for Management of Incidental Pulmonary Nodules Detected on CT Images: From the Fleischner Society 2017; Radiology 2017; 284:228-243. 3. Mild hepatic steatosis. 4. 7 mm interpolar right renal cyst. Electronically Signed   By: Ashley Royalty M.D.   On: 04/28/2018 00:21    Assessment: 62 year old female with history of chronic abdominal pain, constipation, PAD, admitted with partial SBO. Previous history of same 2 years ago. Felt to be related to adhesive disease. CT with question of inflammatory segmental narrowing of distal jejunum/proximal ileum, which is similar to prior 2017 exam. Clinically improved today. Remains on ice chips. +Flatus, no BM yet. Surgery following. May benefit from CTE to further evaluate small bowel. Will need to review with radiology first.    Nodular densities of lung: Follow-up as outpatient per PCP  Plan: Remain on ice chips for now: diet advancement per surgery Reviewing CT with radiologist: she may be best served with CTE Continue Pepcid  Further recommendations after review with radiology  Annitta Needs, PhD, ANP-BC [facility]    LOS: 0 days    04/29/2018, 10:42 AM

## 2018-04-30 LAB — CBC WITH DIFFERENTIAL/PLATELET
BASOS PCT: 0 %
Basophils Absolute: 0 10*3/uL (ref 0.0–0.1)
Eosinophils Absolute: 0 10*3/uL (ref 0.0–0.7)
Eosinophils Relative: 1 %
HEMATOCRIT: 39.1 % (ref 36.0–46.0)
HEMOGLOBIN: 12.2 g/dL (ref 12.0–15.0)
Lymphocytes Relative: 41 %
Lymphs Abs: 2.8 10*3/uL (ref 0.7–4.0)
MCH: 27.9 pg (ref 26.0–34.0)
MCHC: 31.2 g/dL (ref 30.0–36.0)
MCV: 89.3 fL (ref 78.0–100.0)
MONOS PCT: 10 %
Monocytes Absolute: 0.7 10*3/uL (ref 0.1–1.0)
NEUTROS ABS: 3.3 10*3/uL (ref 1.7–7.7)
NEUTROS PCT: 48 %
Platelets: 225 10*3/uL (ref 150–400)
RBC: 4.38 MIL/uL (ref 3.87–5.11)
RDW: 15.3 % (ref 11.5–15.5)
WBC: 6.9 10*3/uL (ref 4.0–10.5)

## 2018-04-30 LAB — BASIC METABOLIC PANEL
Anion gap: 7 (ref 5–15)
BUN: <5 mg/dL — ABNORMAL LOW (ref 6–20)
CHLORIDE: 106 mmol/L (ref 101–111)
CO2: 26 mmol/L (ref 22–32)
CREATININE: 0.88 mg/dL (ref 0.44–1.00)
Calcium: 8.2 mg/dL — ABNORMAL LOW (ref 8.9–10.3)
GFR calc non Af Amer: >60 mL/min (ref >60)
Glucose, Bld: 96 mg/dL (ref 65–99)
POTASSIUM: 3.8 mmol/L (ref 3.5–5.1)
Sodium: 139 mmol/L (ref 135–145)

## 2018-04-30 LAB — URINE CULTURE: Culture: NO GROWTH

## 2018-04-30 LAB — HIV ANTIBODY (ROUTINE TESTING W REFLEX): HIV Screen 4th Generation wRfx: NONREACTIVE

## 2018-04-30 LAB — MAGNESIUM: MAGNESIUM: 2 mg/dL (ref 1.7–2.4)

## 2018-04-30 MED ORDER — AMOXICILLIN-POT CLAVULANATE 875-125 MG PO TABS
1.0000 | ORAL_TABLET | Freq: Two times a day (BID) | ORAL | 0 refills | Status: DC
Start: 2018-04-30 — End: 2018-09-23

## 2018-04-30 NOTE — Discharge Summary (Signed)
Physician Discharge Summary  [place]:[id number] DOB: 08-31-1956 DOA: 04/27/2018  PCP: [name], MD  Admit date: 04/27/2018 Discharge date: 04/30/2018  Admitted From: home Disposition:  Home  Recommendations for Outpatient Follow-up:  1. Follow up with PCP in 1-2 weeks 2. Please obtain BMP/CBC in one week     Discharge Condition: Stable CODE STATUS: FULL Diet recommendation: Heart Healthy/Soft   Brief/Interim Summary: 62 year old female with a history of COPD, hyperlipidemia, hypertension, constipation presenting with abdominal pain, distention, and nausea vomiting that began on the evening of 04/27/2018. The patient states that she had a bowel obstruction in 2017, but does not recall the circumstances or etiology. Her last bowel movement was on 04/26/2018. She denies any fevers, chills, diarrhea, hematochezia, melena. She denies any NSAIDs. She denies any recent travels or new medications. Interestingly, the patient relates postprandial abdominal pain for the past 1 to 2 years. She also describes early satiety. She states that her abdominal pain usually occurs 5 minutes after eating. She is due to have an EGD on 05/09/2018. CT of the abdomen and pelvis at the time of admission showed fluid-filled anddilated jejunum up to 2.6 cm with a segmental inflammatory thickening of the distal jejunum/proximal ileum. The terminal ileum and distal ileum were decompressed without inflammatory changes. The patient was noted to have sodium 147, potassium 2.9, WBC 12.2 at the time of admission. General surgery and GIwereconsulted to assist with management.  In the afternoon of 04/28/2018, the patient developed a fever of 102.4 F and hypoxia with oxygen saturation 84% on room air.. Blood cultures x2 sets was obtained. The patient subsequently endorsed that she has had coughing and congestion for the past 2 weeks. She saw her primary care provider 1 to 2 weeks prior to this  admission, and she stated that she was started on "cough medicine". She has alsobeen complaining of some mild shortness of breath.She continues to smoke 1/2 pack/day.    Discharge Diagnoses:  PSBO -04/27/2018 CT abdomen and pelvis--early or partial SBO secondary to inflammatory segmental narrowing of the distal jejunum/proximal ileum -Appreciate general surgery consult>>non-operative tx -Case discussed withDr. Constance Haw -consult GI--appreciated-->needs CT enterography as out pt in 4 weeks -05/09/18--pt has EGD scheduled as outpt -diet advanced to clears>>>soft diet with pt tolerated -IV fluids -Judicious pain control -d/c linzess  Acute respiratory failure with hypoxia -secondary to pneumonia -Stable on 2 L nasal cannula>>>weaned to RA -personally reviewed chest x-ray--opacity on lateral view  Pneumonia -had 3 days zosyn -home with amox/clav x 4 days  Fever -secondary to enteritis vs PNA -blood cultures x 2--neg -UA --neg for pyuria -04/28/18--CXR--opacity on lateral view -procalcitonin<0.10 -lactic acid--0.80 -start empiric zosyn--pending blood culture data  Hypernatremia -Continue hypotonic fluid-->resolved -A.m. BMP  Pulmonary nodule -Incidental finding on CT -Outpatient surveillance  Essential hypertension -Holding amlodipine until able to toleratepo -Hydralazineprn SBP 180  Hyperlipidemia -Restart statin once able to toleratepo  Tobacco abuse -I have discussed tobacco cessation with the patient. I have counseled the patient regarding the negative impacts of continued tobacco use including but not limited to lung cancer, COPD, and cardiovascular disease. I have discussed alternatives to tobacco and modalities that may help facilitate tobacco cessation including but not limited to biofeedback, hypnosis, and medications. Total time spent with tobacco counseling was 4 minutes.  Hypokalemia -replete -check mag--2.1     Discharge  Instructions   Allergies as of 04/30/2018   No Known Allergies     Medication List    STOP taking these medications  linaclotide 290 MCG Caps capsule Commonly known as:  LINZESS     TAKE these medications   albuterol 108 (90 Base) MCG/ACT inhaler Commonly known as:  PROVENTIL HFA;VENTOLIN HFA Inhale 2 puffs into the lungs every 4 (four) hours as needed for wheezing or shortness of breath.   amLODipine 5 MG tablet Commonly known as:  NORVASC Take 5 mg by mouth daily.   amoxicillin-clavulanate 875-125 MG tablet Commonly known as:  AUGMENTIN Take 1 tablet by mouth 2 (two) times daily.   atorvastatin 20 MG tablet Commonly known as:  LIPITOR Take 1 tablet (20 mg total) by mouth daily.       No Known Allergies  Consultations:  General surgery  [facility]   Procedures/Studies: Dg Chest 2 View  Result Date: 04/28/2018 CLINICAL DATA:  Shortness of breath cough and fever. EXAM: CHEST - 2 VIEW COMPARISON:  03/01/2018 and prior radiographs FINDINGS: The cardiomediastinal silhouette is unremarkable. LOWER lobe opacity identified on the LATERAL view may represent atelectasis or airspace disease. No pleural effusion, edema, mass or pneumothorax. No acute bony abnormalities are present. IMPRESSION: LOWER lobe opacity on the LATERAL view which may represent atelectasis or airspace disease/pneumonia. Radiographic follow-up to resolution is recommended. Electronically Signed   By: Margarette Canada M.D.   On: 04/28/2018 17:48   Ct Abdomen Pelvis W Contrast  Result Date: 04/28/2018 CLINICAL DATA:  Sharp umbilical pain and cramping starting today. History of small bowel obstructions in the past. EXAM: CT ABDOMEN AND PELVIS WITH CONTRAST TECHNIQUE: Multidetector CT imaging of the abdomen and pelvis was performed using the standard protocol following bolus administration of intravenous contrast. CONTRAST:  12mL ISOVUE-300 IOPAMIDOL (ISOVUE-300) INJECTION 61% COMPARISON:  03/01/2018 and  priors dating back through 08/16/2016 FINDINGS: Lower chest: Bibasilar dependent atelectasis with scattered centrilobular emphysema. Tiny nodular densities at the right lung base, one along the minor fissure likely represents minimal focal pleural thickening or more likely an intrafissural lymph node. This measures up to 3.8 mm. An additional 5 mm nodular density is seen in the right lower lobe anteriorly but is only partially included in could represent a tiny pulmonary nodule or more likely branch point for pulmonary vessel given lack of similar finding on recent comparison. No pleural effusion. Heart size is top-normal. Hepatobiliary: Cholecystectomy. Mild hepatic steatosis. No biliary dilatation or mass. Pancreas: Normal Spleen: Normal Adrenals/Urinary Tract: Normal bilateral adrenal glands. 7 mm interpolar right renal cyst. No nephrolithiasis nor obstructive uropathy. Unremarkable urinary bladder without focal mural thickening or calculus. Stomach/Bowel: Small hiatal hernia. The stomach is otherwise unremarkable. Normal small bowel rotation. The duodenum is decompressed as are loops of proximal and mid jejunum. Fluid-filled dilatation of mid to distal jejunum is identified measuring up to 2.6 cm in the left lower quadrant and mid abdomen. This may be secondary to what appears to be segmental inflammatory thickening of distal jejunum/proximal ileum, series 2/56. Findings are similar to that seen in 2017. The terminal and distal ileum are decompressed and noninflamed. The colon is unremarkable. Surgical clips are seen in the left hemipelvis. Vascular/Lymphatic: No enlarged lymph nodes.  No aortic aneurysm. Reproductive: Status post hysterectomy. No adnexal masses. Other: No free air nor free fluid. Musculoskeletal: No acute or significant osseous findings. IMPRESSION: 1. Early or partial SBO secondary to what appears to be inflammatory segmental narrowing of distal jejunum/proximal ileum. Findings are suspicious  for changes of inflammatory bowel. The appearance is similar to that of prior 2017 exam. 2. Tiny nodular densities in the right lower lobe  measuring up to 5 mm. One appears to be within the fissure likely represents an intra fissural lymph node measuring 3.8 mm. No follow-up needed if patient is low-risk (and has no known or suspected primary neoplasm). Non-contrast chest CT can be considered in 12 months if patient is high-risk. This recommendation follows the consensus statement: Guidelines for Management of Incidental Pulmonary Nodules Detected on CT Images: From the Fleischner Society 2017; Radiology 2017; 284:228-243. 3. Mild hepatic steatosis. 4. 7 mm interpolar right renal cyst. Electronically Signed   By: Ashley Royalty M.D.   On: 04/28/2018 00:21         Discharge Exam: Vitals:   04/30/18 0516 04/30/18 1403  BP: 131/71 123/69  Pulse: (!) 58 63  Resp:  18  Temp: 98.5 F (36.9 C) 98.1 F (36.7 C)  SpO2: 96% 97%   Vitals:   04/29/18 1438 04/29/18 2226 04/30/18 0516 04/30/18 1403  BP: (!) 141/75 137/77 131/71 123/69  Pulse: 69 65 (!) 58 63  Resp: 18 18  18   Temp: 98.7 F (37.1 C) 98.5 F (36.9 C) 98.5 F (36.9 C) 98.1 F (36.7 C)  TempSrc: Oral Oral Oral   SpO2: 96% 100% 96% 97%  Weight:      Height:        General: Pt is alert, awake, not in acute distress Cardiovascular: RRR, S1/S2 +, no rubs, no gallops Respiratory: bibasilar rales, no wheeze Abdominal: Soft, NT, ND, bowel sounds + Extremities: no edema, no cyanosis   The results of significant diagnostics from this hospitalization (including imaging, microbiology, ancillary and laboratory) are listed below for reference.    Significant Diagnostic Studies: Dg Chest 2 View  Result Date: 04/28/2018 CLINICAL DATA:  Shortness of breath cough and fever. EXAM: CHEST - 2 VIEW COMPARISON:  03/01/2018 and prior radiographs FINDINGS: The cardiomediastinal silhouette is unremarkable. LOWER lobe opacity identified on the  LATERAL view may represent atelectasis or airspace disease. No pleural effusion, edema, mass or pneumothorax. No acute bony abnormalities are present. IMPRESSION: LOWER lobe opacity on the LATERAL view which may represent atelectasis or airspace disease/pneumonia. Radiographic follow-up to resolution is recommended. Electronically Signed   By: Margarette Canada M.D.   On: 04/28/2018 17:48   Ct Abdomen Pelvis W Contrast  Result Date: 04/28/2018 CLINICAL DATA:  Sharp umbilical pain and cramping starting today. History of small bowel obstructions in the past. EXAM: CT ABDOMEN AND PELVIS WITH CONTRAST TECHNIQUE: Multidetector CT imaging of the abdomen and pelvis was performed using the standard protocol following bolus administration of intravenous contrast. CONTRAST:  136mL ISOVUE-300 IOPAMIDOL (ISOVUE-300) INJECTION 61% COMPARISON:  03/01/2018 and priors dating back through 08/16/2016 FINDINGS: Lower chest: Bibasilar dependent atelectasis with scattered centrilobular emphysema. Tiny nodular densities at the right lung base, one along the minor fissure likely represents minimal focal pleural thickening or more likely an intrafissural lymph node. This measures up to 3.8 mm. An additional 5 mm nodular density is seen in the right lower lobe anteriorly but is only partially included in could represent a tiny pulmonary nodule or more likely branch point for pulmonary vessel given lack of similar finding on recent comparison. No pleural effusion. Heart size is top-normal. Hepatobiliary: Cholecystectomy. Mild hepatic steatosis. No biliary dilatation or mass. Pancreas: Normal Spleen: Normal Adrenals/Urinary Tract: Normal bilateral adrenal glands. 7 mm interpolar right renal cyst. No nephrolithiasis nor obstructive uropathy. Unremarkable urinary bladder without focal mural thickening or calculus. Stomach/Bowel: Small hiatal hernia. The stomach is otherwise unremarkable. Normal small bowel rotation. The duodenum  is decompressed  as are loops of proximal and mid jejunum. Fluid-filled dilatation of mid to distal jejunum is identified measuring up to 2.6 cm in the left lower quadrant and mid abdomen. This may be secondary to what appears to be segmental inflammatory thickening of distal jejunum/proximal ileum, series 2/56. Findings are similar to that seen in 2017. The terminal and distal ileum are decompressed and noninflamed. The colon is unremarkable. Surgical clips are seen in the left hemipelvis. Vascular/Lymphatic: No enlarged lymph nodes.  No aortic aneurysm. Reproductive: Status post hysterectomy. No adnexal masses. Other: No free air nor free fluid. Musculoskeletal: No acute or significant osseous findings. IMPRESSION: 1. Early or partial SBO secondary to what appears to be inflammatory segmental narrowing of distal jejunum/proximal ileum. Findings are suspicious for changes of inflammatory bowel. The appearance is similar to that of prior 2017 exam. 2. Tiny nodular densities in the right lower lobe measuring up to 5 mm. One appears to be within the fissure likely represents an intra fissural lymph node measuring 3.8 mm. No follow-up needed if patient is low-risk (and has no known or suspected primary neoplasm). Non-contrast chest CT can be considered in 12 months if patient is high-risk. This recommendation follows the consensus statement: Guidelines for Management of Incidental Pulmonary Nodules Detected on CT Images: From the Fleischner Society 2017; Radiology 2017; 284:228-243. 3. Mild hepatic steatosis. 4. 7 mm interpolar right renal cyst. Electronically Signed   By: Ashley Royalty M.D.   On: 04/28/2018 00:21     Microbiology: Recent Results (from the past 240 hour(s))  Culture, blood (Routine X 2) w Reflex to ID Panel     Status: None (Preliminary result)   Collection Time: 04/28/18  2:11 PM  Result Value Ref Range Status   Specimen Description BLOOD RIGHT HAND  Final   Special Requests   Final    BOTTLES DRAWN AEROBIC  AND ANAEROBIC Blood Culture adequate volume   Culture   Final    NO GROWTH 2 DAYS Performed at [facility], [address], [place], [place] [address]    Report Status PENDING  Incomplete  Culture, blood (Routine X 2) w Reflex to ID Panel     Status: None (Preliminary result)   Collection Time: 04/28/18  2:14 PM  Result Value Ref Range Status   Specimen Description BLOOD RIGHT HAND  Final   Special Requests   Final    BOTTLES DRAWN AEROBIC AND ANAEROBIC Blood Culture adequate volume   Culture   Final    NO GROWTH 2 DAYS Performed at [facility], [address], [place], [place] [address]    Report Status PENDING  Incomplete  Culture, Urine     Status: None   Collection Time: 04/28/18  8:55 PM  Result Value Ref Range Status   Specimen Description   Final    URINE, CLEAN CATCH Performed at [facility], [address], [place], [place] [address]    Special Requests   Final    NONE Performed at [facility], [address], [place], [place] [address]    Culture   Final    NO GROWTH Performed at [facility], [place] [address], [place], [place] [address]    Report Status 04/30/2018 FINAL  Final     Labs: Basic Metabolic Panel: Recent Labs  Lab 04/27/18 2156 04/28/18 0704 04/29/18 0623 04/30/18 0627  NA 147* 139 139 139  K 2.9* 3.9 3.7 3.8  CL 104 104 105 106  CO2 30  28 25 26   GLUCOSE 114* 139* 69 96  BUN 9 8 10  <5*  CREATININE 0.76 0.69 0.88 0.88  CALCIUM 9.6 8.2* 8.1* 8.2*  MG 1.8  --  2.1 2.0  PHOS 2.8  --   --   --    Liver Function Tests: Recent Labs  Lab 04/27/18 2156  AST 13*  ALT 15  ALKPHOS 91  BILITOT 0.8  PROT 7.8  ALBUMIN 4.3   Recent Labs  Lab 04/27/18 2156  LIPASE 38   No results for input(s): AMMONIA in the last 168 hours. CBC: Recent Labs  Lab 04/27/18 2156 04/28/18 0704 04/29/18 0623 04/30/18 0627  WBC 12.2* 12.4* 11.0* 6.9  NEUTROABS 8.4* 9.5* 6.9 3.3  HGB 15.3* 12.8 12.4 12.2  HCT 47.6* 42.0 41.0 39.1  MCV 88.6 90.3 91.1  89.3  PLT 224 218 211 225   Cardiac Enzymes: No results for input(s): CKTOTAL, CKMB, CKMBINDEX, TROPONINI in the last 168 hours. BNP: Invalid input(s): POCBNP CBG: No results for input(s): GLUCAP in the last 168 hours.  Time coordinating discharge:  36 minutes  Signed:  Orson Eva, DO [facility] Pager: [phone] 04/30/2018, 3:57 PM

## 2018-04-30 NOTE — Progress Notes (Signed)
Subjective: No abdominal pain. Still notes early satiety, feeling like food "sits in upper abdomen", similar to symptoms at time of outpatient appt 04/15/18. Wants to keep scheduled appt for outpatient EGD 5/23. No dysphagia. +flatus, +BM, tolerating diet.   Objective: Vital signs in last 24 hours: Temp:  [98.5 F (36.9 C)-98.7 F (37.1 C)] 98.5 F (36.9 C) (05/14 0516) Pulse Rate:  [58-69] 58 (05/14 0516) Resp:  [18] 18 (05/13 2226) BP: (131-141)/(71-77) 131/71 (05/14 0516) SpO2:  [96 %-100 %] 96 % (05/14 0516) Last BM Date: 04/29/18 General:   Alert and oriented, pleasant Head:  Normocephalic and atraumatic. Abdomen:  Bowel sounds present, soft, non-tender, non-distended. No HSM or hernias noted. No rebound or guarding. No masses appreciated  Msk:  Symmetrical without gross deformities. Normal posture. Extremities:  Without  edema. Neurologic:  Alert and  oriented x4 Psych:  Alert and cooperative. Normal mood and affect.  Intake/Output from previous day: 05/13 0701 - 05/14 0700 In: 2568.8 [P.O.:450; I.V.:1868.8; IV Piggyback:250] Out: -  Intake/Output this shift: No intake/output data recorded.  Lab Results: Recent Labs    04/28/18 0704 04/29/18 0623 04/30/18 0627  WBC 12.4* 11.0* 6.9  HGB 12.8 12.4 12.2  HCT 42.0 41.0 39.1  PLT 218 211 225   BMET Recent Labs    04/28/18 0704 04/29/18 0623 04/30/18 0627  NA 139 139 139  K 3.9 3.7 3.8  CL 104 105 106  CO2 28 25 26   GLUCOSE 139* 69 96  BUN 8 10 <5*  CREATININE 0.69 0.88 0.88  CALCIUM 8.2* 8.1* 8.2*   LFT Recent Labs    04/27/18 2156  PROT 7.8  ALBUMIN 4.3  AST 13*  ALT 15  ALKPHOS 91  BILITOT 0.8     Studies/Results: Dg Chest 2 View  Result Date: 04/28/2018 CLINICAL DATA:  Shortness of breath cough and fever. EXAM: CHEST - 2 VIEW COMPARISON:  03/01/2018 and prior radiographs FINDINGS: The cardiomediastinal silhouette is unremarkable. LOWER lobe opacity identified on the LATERAL view may  represent atelectasis or airspace disease. No pleural effusion, edema, mass or pneumothorax. No acute bony abnormalities are present. IMPRESSION: LOWER lobe opacity on the LATERAL view which may represent atelectasis or airspace disease/pneumonia. Radiographic follow-up to resolution is recommended. Electronically Signed   By: Margarette Canada M.D.   On: 04/28/2018 17:48    Assessment: 62 year old female with history of chronic abdominal pain, constipation, PAD, admitted with partial SBO. Previous history of SBO conservatively managed in Aug 2017. CT with question of inflammatory segmental narrowing of distal jejunum/proximal ileum, which is similar to prior 2017 exam. As of note, when seen as outpatient April 2019, EGD had been arranged due to abdominal pain intermittently with eating, early satiety, sensation of food just "sitting". She still notes this symptoms despite clinical improvement from acute episode and desires upper endoscopy as previously planned outpatient 05/09/18. No prior EGD. Will keep on schedule for now.   Will need CTE in 4 weeks and follow-up with [facility]. Possible referral to surgery as outpatient depending on CTE findings.   Nodular densities of lung: Follow-up as outpatient per PCP  Plan: Hopeful discharge today Tentatively still planned for outpatient EGD 05/09/18: unclear if her symptoms at time of April 2019 appt were due to evolving partial SBO, but even with improvement she still notes dyspepsia.  Will need CTE in 4 weeks with possible referral to surgery as outpatient Will arrange outpatient appt with [facility]   Annitta Needs, PhD, ANP-BC [facility]  Gastroenterology    LOS: 1 day    04/30/2018, 8:12 AM

## 2018-04-30 NOTE — Progress Notes (Signed)
Virginia Washington discharged Home per MD order.  Discharge instructions reviewed and discussed with the patient, all questions and concerns answered. Copy of instructions and scripts given to patient.  Allergies as of 04/30/2018   No Known Allergies     Medication List    STOP taking these medications   linaclotide 290 MCG Caps capsule Commonly known as:  LINZESS     TAKE these medications   albuterol 108 (90 Base) MCG/ACT inhaler Commonly known as:  PROVENTIL HFA;VENTOLIN HFA Inhale 2 puffs into the lungs every 4 (four) hours as needed for wheezing or shortness of breath.   amLODipine 5 MG tablet Commonly known as:  NORVASC Take 5 mg by mouth daily.   amoxicillin-clavulanate 875-125 MG tablet Commonly known as:  AUGMENTIN Take 1 tablet by mouth 2 (two) times daily.   atorvastatin 20 MG tablet Commonly known as:  LIPITOR Take 1 tablet (20 mg total) by mouth daily.       Patients skin is clean, dry and intact, no evidence of skin break down. IV site discontinued and catheter remains intact. Site without signs and symptoms of complications. Dressing and pressure applied.  Patient escorted to elevator,  no distress noted upon discharge.  [place]ia Washington 04/30/2018 6:48 PM

## 2018-04-30 NOTE — Progress Notes (Signed)
[facility] Progress Note     Subjective: Reporting no pain and BMs. BM recorded in chart. Discussed case yesterday with Rad and GI. Plan for outpatient CT enterography.   Objective: Vital signs in last 24 hours: Temp:  [98.5 F (36.9 C)-98.7 F (37.1 C)] 98.5 F (36.9 C) (05/14 0516) Pulse Rate:  [58-69] 58 (05/14 0516) Resp:  [18] 18 (05/13 2226) BP: (131-141)/(71-77) 131/71 (05/14 0516) SpO2:  [96 %-100 %] 96 % (05/14 0516) Last BM Date: 04/29/18  Intake/Output from previous day: 05/13 0701 - 05/14 0700 In: 2568.8 [P.O.:450; I.V.:1868.8; IV Piggyback:250] Out: -  Intake/Output this shift: No intake/output data recorded.  General appearance: alert, cooperative and no distress Resp: normal work breathing GI: soft, non-tender; bowel sounds normal; no masses,  no organomegaly  Lab Results:  Recent Labs    04/29/18 0623 04/30/18 0627  WBC 11.0* 6.9  HGB 12.4 12.2  HCT 41.0 39.1  PLT 211 225   BMET Recent Labs    04/29/18 0623 04/30/18 0627  NA 139 139  K 3.7 3.8  CL 105 106  CO2 25 26  GLUCOSE 69 96  BUN 10 <5*  CREATININE 0.88 0.88  CALCIUM 8.1* 8.2*   Studies/Results: Dg Chest 2 View  Result Date: 04/28/2018 CLINICAL DATA:  Shortness of breath cough and fever. EXAM: CHEST - 2 VIEW COMPARISON:  03/01/2018 and prior radiographs FINDINGS: The cardiomediastinal silhouette is unremarkable. LOWER lobe opacity identified on the LATERAL view may represent atelectasis or airspace disease. No pleural effusion, edema, mass or pneumothorax. No acute bony abnormalities are present. IMPRESSION: LOWER lobe opacity on the LATERAL view which may represent atelectasis or airspace disease/pneumonia. Radiographic follow-up to resolution is recommended. Electronically Signed   By: Margarette Canada M.D.   On: 04/28/2018 17:48    Assessment/Plan: Ms. Mckendree is a 62 yo with a resolving pSBO from thickened loop of SB of unknown etiology. Possible enteritis versus IBD  but had a similar picture in 2017. -Soft diet this AM -If tolerates can dc home -Follow up with GI and CT enterography in the upcoming weeks ~ 4 weeks out per Rads recs  -GI can refer to Surgery as outpatient if needed based on the CTE findings    LOS: 1 day    Virl Cagey 04/30/2018

## 2018-04-30 NOTE — Progress Notes (Signed)
Called Endo scheduler, need for possible pyloric dilation comment added to EGD scheduled for 05/09/18.

## 2018-05-03 LAB — CULTURE, BLOOD (ROUTINE X 2)
CULTURE: NO GROWTH
Culture: NO GROWTH
SPECIAL REQUESTS: ADEQUATE
Special Requests: ADEQUATE

## 2018-05-08 ENCOUNTER — Telehealth: Payer: Self-pay | Admitting: Gastroenterology

## 2018-05-08 DIAGNOSIS — K56600 Partial intestinal obstruction, unspecified as to cause: Secondary | ICD-10-CM

## 2018-05-08 NOTE — Telephone Encounter (Signed)
Patient needs CTE in 3 weeks to follow up on abnormal inflamed jejunum/proximal ileum with partial small bowel obstruction seen on CT in 04/2018.

## 2018-05-08 NOTE — Telephone Encounter (Signed)
Patient called back and is aware of appt details. Also aware I have mailed letter with appt information.

## 2018-05-08 NOTE — Telephone Encounter (Signed)
CTE scheduled for 06/11/18 at 1:00pm, needs to arrive at 11:630am, npo 4 hrs prior to test. Called pt and LMOVM. Letter also mailed with appt information

## 2018-05-08 NOTE — Addendum Note (Signed)
Addended by: Inge Rise on: 05/08/2018 02:25 PM   Modules accepted: Orders

## 2018-05-09 ENCOUNTER — Other Ambulatory Visit: Payer: Self-pay

## 2018-05-09 ENCOUNTER — Encounter (HOSPITAL_COMMUNITY)
Admission: RE | Disposition: A | Discharge status: Home / Self Care | Payer: Self-pay | Source: Ambulatory Visit | Attending: Gastroenterology

## 2018-05-09 ENCOUNTER — Encounter (HOSPITAL_COMMUNITY): Payer: Self-pay | Admitting: *Deleted

## 2018-05-09 ENCOUNTER — Ambulatory Visit (HOSPITAL_COMMUNITY)
Admission: RE | Admit: 2018-05-09 | Discharge: 2018-05-09 | Disposition: A | Discharge status: Home / Self Care | Payer: Medicare HMO | Source: Ambulatory Visit | Attending: Gastroenterology | Admitting: Gastroenterology

## 2018-05-09 DIAGNOSIS — R1310 Dysphagia, unspecified: Secondary | ICD-10-CM | POA: Diagnosis not present

## 2018-05-09 DIAGNOSIS — K2950 Unspecified chronic gastritis without bleeding: Secondary | ICD-10-CM | POA: Diagnosis not present

## 2018-05-09 DIAGNOSIS — J449 Chronic obstructive pulmonary disease, unspecified: Secondary | ICD-10-CM | POA: Insufficient documentation

## 2018-05-09 DIAGNOSIS — R69 Illness, unspecified: Secondary | ICD-10-CM | POA: Diagnosis not present

## 2018-05-09 DIAGNOSIS — Z79899 Other long term (current) drug therapy: Secondary | ICD-10-CM | POA: Diagnosis not present

## 2018-05-09 DIAGNOSIS — Q394 Esophageal web: Secondary | ICD-10-CM

## 2018-05-09 DIAGNOSIS — R1013 Epigastric pain: Secondary | ICD-10-CM | POA: Diagnosis not present

## 2018-05-09 DIAGNOSIS — R109 Unspecified abdominal pain: Secondary | ICD-10-CM

## 2018-05-09 DIAGNOSIS — I10 Essential (primary) hypertension: Secondary | ICD-10-CM | POA: Diagnosis not present

## 2018-05-09 DIAGNOSIS — F17210 Nicotine dependence, cigarettes, uncomplicated: Secondary | ICD-10-CM | POA: Insufficient documentation

## 2018-05-09 DIAGNOSIS — K2970 Gastritis, unspecified, without bleeding: Secondary | ICD-10-CM | POA: Diagnosis not present

## 2018-05-09 DIAGNOSIS — K319 Disease of stomach and duodenum, unspecified: Secondary | ICD-10-CM | POA: Diagnosis not present

## 2018-05-09 DIAGNOSIS — Z86718 Personal history of other venous thrombosis and embolism: Secondary | ICD-10-CM | POA: Diagnosis present

## 2018-05-09 HISTORY — PX: ESOPHAGOGASTRODUODENOSCOPY: SHX5428

## 2018-05-09 SURGERY — EGD (ESOPHAGOGASTRODUODENOSCOPY)
Anesthesia: Moderate Sedation

## 2018-05-09 MED ORDER — MIDAZOLAM HCL 5 MG/5ML IJ SOLN
INTRAMUSCULAR | Status: DC | PRN
  Administered 2018-05-09 (×2): 2 mg via INTRAVENOUS
  Administered 2018-05-09: 1 mg via INTRAVENOUS

## 2018-05-09 MED ORDER — MEPERIDINE HCL 100 MG/ML IJ SOLN
INTRAMUSCULAR | Status: DC
Start: 2018-05-09 — End: 2018-05-09
  Filled 2018-05-09: qty 2

## 2018-05-09 MED ORDER — MINERAL OIL PO OIL
TOPICAL_OIL | ORAL | Status: AC
  Filled 2018-05-09: qty 30

## 2018-05-09 MED ORDER — OMEPRAZOLE 20 MG PO CPDR: DELAYED_RELEASE_CAPSULE | ORAL | 3 refills | Status: DC

## 2018-05-09 MED ORDER — LIDOCAINE VISCOUS HCL 2 % MT SOLN
OROMUCOSAL | Status: AC
  Filled 2018-05-09: qty 15

## 2018-05-09 MED ORDER — SODIUM CHLORIDE 0.9 % IV SOLN
INTRAVENOUS | Status: DC
  Administered 2018-05-09: 13:00:00 via INTRAVENOUS

## 2018-05-09 MED ORDER — STERILE WATER FOR IRRIGATION IR SOLN
Status: DC | PRN
  Administered 2018-05-09: 2.5 mL

## 2018-05-09 MED ORDER — LIDOCAINE VISCOUS HCL 2 % MT SOLN
OROMUCOSAL | Status: DC | PRN
  Administered 2018-05-09: 1 via OROMUCOSAL

## 2018-05-09 MED ORDER — MIDAZOLAM HCL 5 MG/5ML IJ SOLN
INTRAMUSCULAR | Status: AC
  Filled 2018-05-09: qty 10

## 2018-05-09 MED ORDER — MEPERIDINE HCL 100 MG/ML IJ SOLN
INTRAMUSCULAR | Status: DC | PRN
  Administered 2018-05-09: 25 mg via INTRAVENOUS
  Administered 2018-05-09: 50 mg via INTRAVENOUS

## 2018-05-09 NOTE — Op Note (Signed)
[facility] Patient Name: Virginia Washington Procedure Date: 05/09/2018 2:02 PM MRN: [id number] Date of Birth: 02/21/1956 Attending MD: Barney Drain MD, MD CSN: [id number] Age: 62 Admit Type: Outpatient Procedure:                Upper GI endoscopy-COLD FORCEPS BIOPSY/ESOPAHGEAL                            DILATION Indications:              Dyspepsia, Dysphagia Providers:                Barney Drain MD, MD, Lurline Del, RN, Nelma Rothman,                            Technician Referring MD:             [name] MD, MD Medicines:                Meperidine 75 mg IV, Midazolam 5 mg IV Complications:            No immediate complications. Estimated Blood Loss:     Estimated blood loss was minimal. Procedure:                Pre-Anesthesia Assessment:                           - Prior to the procedure, a History and Physical                            was performed, and patient medications and                            allergies were reviewed. The patient's tolerance of                            previous anesthesia was also reviewed. The risks                            and benefits of the procedure and the sedation                            options and risks were discussed with the patient.                            All questions were answered, and informed consent                            was obtained. Prior Anticoagulants: The patient has                            taken no previous anticoagulant or antiplatelet                            agents. ASA Grade Assessment: II - A patient with  mild systemic disease. After reviewing the risks                            and benefits, the patient was deemed in                            satisfactory condition to undergo the procedure.                            After obtaining informed consent, the endoscope was                            passed under direct vision. Throughout the   procedure, the patient's blood pressure, pulse, and                            oxygen saturations were monitored continuously. The                            EG-[id number] ([id number]) scope was introduced through the                            mouth, and advanced to the second part of duodenum.                            The upper GI endoscopy was somewhat difficult due                            to the patient's agitation. Successful completion                            of the procedure was aided by increasing the dose                            of sedation medication. The patient tolerated the                            procedure fairly well. 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 A guidewire was placed and the       scope was withdrawn. Dilation was performed with a Savary dilator with       mild resistance at 15 mm, 16 mm and 17 mm.      Diffuse mild inflammation characterized by congestion (edema) and       erythema was found in the gastric body and in the gastric antrum.       Biopsies were taken with a cold forceps for Helicobacter pylori testing.      The examined duodenum was normal. 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 Biopsied. Moderate Sedation:      Moderate (conscious) sedation was administered by the endoscopy nurse       and supervised by the endoscopist. The following parameters were       monitored: oxygen saturation, heart rate, blood pressure, and response       to care. Total physician intraservice time was 19 minutes. Recommendation:           - Await pathology results.                           - Resume previous diet. MAY EAT MEAT BUT WOULD                             LIMIT TO CHOPPED/GROUND UNTIL DENTAL ISSUES                            ADDRESSED.                           - Continue present medications.                           - Use Prilosec (omeprazole) 20 mg PO daily.                           - Return to my office in 4 months.                           - Patient has a contact number available for                            emergencies. The signs and symptoms of potential                            delayed complications were discussed with the                            patient. Return to normal activities tomorrow.                            Written discharge instructions were provided to the                            patient. Procedure Code(s):        --- Professional ---                           [phone], Esophagogastroduodenoscopy, flexible,  transoral; with insertion of guide wire followed by                            passage of dilator(s) through esophagus over guide                            wire                           43239, Esophagogastroduodenoscopy, flexible,                            transoral; with biopsy, single or multiple                           G0500, Moderate sedation services provided by the                            same physician or other qualified health care                            professional performing a gastrointestinal                            endoscopic service that sedation supports,                            requiring the presence of an independent trained                            observer to assist in the monitoring of the                            patient's level of consciousness and physiological                            status; initial 15 minutes of intra-service time;                            patient age 68 years or older (additional time may                            be reported with [phone], as appropriate) Diagnosis Code(s):        --- Professional  ---                           Q39.4, Esophageal web                           K29.70, Gastritis, unspecified, without bleeding                           R10.13, Epigastric pain                           R13.10, Dysphagia, unspecified CPT copyright  2017 American Medical Association. All rights reserved. The codes documented in this report are preliminary and upon coder review may  be revised to meet current compliance requirements. Barney Drain, MD Barney Drain MD, MD 05/09/2018 2:42:14 PM This report has been signed electronically. Number of Addenda: 0

## 2018-05-09 NOTE — H&P (Signed)
Primary Care Physician:  [name], MD Primary Gastroenterologist:  Dr. Oneida Alar  Pre-Procedure History & Physical: HPI:  [place]ia Washington is a 62 y.o. female here for DYSPHAGIA/DYSPEPSIA.  Past Medical History:  Diagnosis Date  . Chronic back pain   . Constipation   . COPD (chronic obstructive pulmonary disease) ([place])   . DVT (deep venous thrombosis) ([place])   . GERD (gastroesophageal reflux disease)   . Headache   . Hyperlipidemia   . Hypertension   . Normal cardiac stress test 12/2014    Past Surgical History:  Procedure Laterality Date  . ABDOMINAL AORTOGRAM W/LOWER EXTREMITY N/A 10/26/2017   Procedure: ABDOMINAL AORTOGRAM W/LOWER EXTREMITY;  Surgeon: Elam Dutch, MD;  Location: [facility];  Service: Cardiovascular;  Laterality: N/A;  . ABDOMINAL HYSTERECTOMY    . CHOLECYSTECTOMY    . COLONOSCOPY  2007   Dr. Laural Golden: small internal hemorrhoids, otherwise normal. exam could be compromised due to quality of prep  . COLONOSCOPY N/A 02/15/2018   Dr. Oneida Alar: 2 simple adenomas, external and internal hemorrhoids. Next colonoscopy 2024-2026    Prior to Admission medications   Medication Sig Start Date End Date Taking? Authorizing Provider  albuterol (PROVENTIL HFA;VENTOLIN HFA) 108 (90 Base) MCG/ACT inhaler Inhale 2 puffs into the lungs every 4 (four) hours as needed for wheezing or shortness of breath. 03/01/18  Yes Francine Graven, DO  amLODipine (NORVASC) 5 MG tablet Take 5 mg by mouth daily.   Yes [provider]  amoxicillin-clavulanate (AUGMENTIN) 875-125 MG tablet Take 1 tablet by mouth 2 (two) times daily. 04/30/18  Yes Tat, Shanon Brow, MD  atorvastatin (LIPITOR) 20 MG tablet Take 1 tablet (20 mg total) by mouth daily. Patient not taking: Reported on 05/01/2018 12/23/14   Fay Records, MD    Allergies as of 04/15/2018  . (No Known Allergies)    Family History  Problem Relation Age of Onset  . Hypertension Mother   . Colon cancer Neg Hx   . Colon  polyps Neg Hx     Social History   Socioeconomic History  . Marital status: Widowed    Spouse name: Not on file  . Number of children: Not on file  . Years of education: Not on file  . Highest education level: Not on file  Occupational History  . Occupation: unemployed  Social Needs  . Financial resource strain: Not on file  . Food insecurity:    Worry: Not on file    Inability: Not on file  . Transportation needs:    Medical: Not on file    Non-medical: Not on file  Tobacco Use  . Smoking status: Current Every Day Smoker    Packs/day: 0.50    Years: 40.00    Pack years: 20.00    Types: Cigarettes  . Smokeless tobacco: Never Used  Substance and Sexual Activity  . Alcohol use: No    Alcohol/week: 0.0 oz  . Drug use: No  . Sexual activity: Not Currently  Lifestyle  . Physical activity:    Days per week: Not on file    Minutes per session: Not on file  . Stress: Not on file  Relationships  . Social connections:    Talks on phone: Not on file    Gets together: Not on file    Attends religious service: Not on file    Active member of club or organization: Not on file    Attends meetings of clubs or organizations: Not on file  Relationship status: Not on file  . Intimate partner violence:    Fear of current or ex partner: Not on file    Emotionally abused: Not on file    Physically abused: Not on file    Forced sexual activity: Not on file  Other Topics Concern  . Not on file  Social History Narrative  . Not on file    Review of Systems: See HPI, otherwise negative ROS   Physical Exam: BP 132/77   Pulse 74   Temp 98.1 F (36.7 C) (Oral)   Resp 18   SpO2 98%  General:   Alert,  pleasant and cooperative in NAD Head:  Normocephalic and atraumatic. Neck:  Supple; Lungs:  Clear throughout to auscultation.    Heart:  Regular rate and rhythm. Abdomen:  Soft, nontender and nondistended. Normal bowel sounds, without guarding, and without rebound.    Neurologic:  Alert and  oriented x4;  grossly normal neurologically.  Impression/Plan:     DYSPHAGIA/DYSPEPSIA  PLAN:  EGD/DIL TODAY. DISCUSSED PROCEDURE, BENEFITS, & RISKS: < 1% chance of medication reaction, bleeding, OR perforation.

## 2018-05-09 NOTE — Discharge Instructions (Signed)
I dilated your esophagus DUE TO YOUR PROBLEM SWALLOWING. PART OF THE PROBLEM SWALLOWING MAY BE DUE TO YOUR DENTAL CONDITION. YOU HAVE gastritis I biopsied your stomach.   UNTIL YOU HAD YOUR DENTAL CONDITION ADRRESSED YOU MAY STILL FELL LIKE YOU HAVE TROUBLE SWALLOWING LARGE CHUNKS OF MEAT OR FOOD.  DRINK WATER TO KEEP YOUR URINE LIGHT YELLOW.  FOLLOW A LOW FAT DIET. MEATS SHOULD BE BAKED, BROILED, OR BOILED. AVOID FRIED FOODS.  START OMEPRAZOLE 30 MINUTES PRIOR TO first MEAL.    YOUR BIOPSY RESULTS WILL BE AVAILABLE IN 7 DAYS.   FOLLOW UP IN 4 MOS.  UPPER ENDOSCOPY AFTER CARE Read the instructions outlined below and refer to this sheet in the next week. These discharge instructions provide you with general information on caring for yourself after you leave the hospital. While your treatment has been planned according to the most current medical practices available, unavoidable complications occasionally occur. If you have any problems or questions after discharge, call DR. Carle Fenech, [phone].  ACTIVITY  You may resume your regular activity, but move at a slower pace for the next 24 hours.   Take frequent rest periods for the next 24 hours.   Walking will help get rid of the air and reduce the bloated feeling in your belly (abdomen).   No driving for 24 hours (because of the medicine (anesthesia) used during the test).   You may shower.   Do not sign any important legal documents or operate any machinery for 24 hours (because of the anesthesia used during the test).    NUTRITION  Drink plenty of fluids.   You may resume your normal diet as instructed by your doctor.   Begin with a light meal and progress to your normal diet. Heavy or fried foods are harder to digest and may make you feel sick to your stomach (nauseated).   Avoid alcoholic beverages for 24 hours or as instructed.    MEDICATIONS  You may resume your normal medications.   WHAT YOU CAN EXPECT  TODAY  Some feelings of bloating in the abdomen.   Passage of more gas than usual.    IF YOU HAD A BIOPSY TAKEN DURING THE UPPER ENDOSCOPY:  Eat a soft diet IF YOU HAVE NAUSEA, BLOATING, ABDOMINAL PAIN, OR VOMITING.    FINDING OUT THE RESULTS OF YOUR TEST Not all test results are available during your visit. DR. Oneida Alar WILL CALL YOU WITHIN 14 DAYS OF YOUR PROCEDUE WITH YOUR RESULTS. Do not assume everything is normal if you have not heard from DR. Dionna Wiedemann, CALL HER OFFICE AT [phone].  SEEK IMMEDIATE MEDICAL ATTENTION AND CALL THE OFFICE: [phone] IF:  You have more than a spotting of blood in your stool.   Your belly is swollen (abdominal distention).   You are nauseated or vomiting.   You have a temperature over 101F.   You have abdominal pain or discomfort that is severe or gets worse throughout the day.  Gastritis  Gastritis is an inflammation (the body's way of reacting to injury and/or infection) of the stomach. It is often caused by viral or bacterial (germ) infections. It can also be caused BY ASPIRIN, BC/GOODY POWDER'S, (IBUPROFEN) MOTRIN, OR ALEVE (NAPROXEN), chemicals (including alcohol), SPICY FOODS, and medications. This illness may be associated with generalized malaise (feeling tired, not well), UPPER ABDOMINAL STOMACH cramps, and fever. One common bacterial cause of gastritis is an organism known as H. Pylori. This can be treated with antibiotics.   ESOPHAGEAL STRICTURE  Esophageal strictures can be caused by stomach acid backing up into the tube that carries food from the mouth down to the stomach (lower esophagus).  TREATMENT There are a number of medicines used to treat reflux/stricture, including: Antacids.  Proton-pump inhibitors: OMEPRAZOLE  ZANTAC OR PEPCID  HOME CARE INSTRUCTIONS Eat 2-3 hours before going to bed.  Try to reach and maintain a healthy weight.  Do not eat just a few very large meals. Instead, eat 4 TO 6 smaller meals throughout  the day.  Try to identify foods and beverages that make your symptoms worse, and avoid these.  Avoid tight clothing.  Do not exercise right after eating.     Lifestyle and home remedies TO CONTROL HEARTBURN/REFLUX  You may eliminate or reduce the frequency of heartburn by making the following lifestyle changes:   Control your weight. Being overweight is a major risk factor for heartburn and GERD. Excess pounds put pressure on your abdomen, pushing up your stomach and causing acid to back up into your esophagus.    Eat smaller meals. 4 TO 6 MEALS A DAY. This reduces pressure on the lower esophageal sphincter, helping to prevent the valve from opening and acid from washing back into your esophagus.    Loosen your belt. Clothes that fit tightly around your waist put pressure on your abdomen and the lower esophageal sphincter.    Eliminate heartburn triggers. Everyone has specific triggers. Common triggers such as fatty or fried foods, spicy food, tomato sauce, carbonated beverages, alcohol, chocolate, mint, garlic, onion, caffeine and nicotine may make heartburn worse.    Avoid stooping or bending. Tying your shoes is OK. Bending over for longer periods to weed your garden isn't, especially soon after eating.    Don't lie down after a meal. Wait at least three to four hours after eating before going to bed, and don't lie down right after eating.    Alternative medicine  Several home remedies exist for treating GERD, but they provide only temporary relief. They include drinking baking soda (sodium bicarbonate) added to water or drinking other fluids such as baking soda mixed with cream of tartar and water.   Although these liquids create temporary relief by neutralizing, washing away or buffering acids, eventually they aggravate the situation by adding gas and fluid to your stomach, increasing pressure and causing more acid reflux. Further, adding more sodium to your diet may  increase your blood pressure and add stress to your heart, and excessive bicarbonate ingestion can alter the acid-base balance in your body.

## 2018-05-14 ENCOUNTER — Encounter (HOSPITAL_COMMUNITY): Payer: Self-pay | Admitting: Gastroenterology

## 2018-05-16 ENCOUNTER — Telehealth: Payer: Self-pay | Admitting: Gastroenterology

## 2018-05-16 NOTE — Telephone Encounter (Signed)
Please call pt. HER stomach Bx shows gastritis. C   UNTIL YOU HAD YOUR DENTAL CONDITION ADRRESSED YOU MAY STILL FEEL LIKE YOU HAVE TROUBLE SWALLOWING LARGE CHUNKS OF MEAT OR FOOD.  DRINK WATER TO KEEP YOUR URINE LIGHT YELLOW.  FOLLOW A LOW FAT DIET. MEATS SHOULD BE BAKED, BROILED, OR BOILED. AVOID FRIED FOODS.  START OMEPRAZOLE 30 MINUTES PRIOR TO first MEAL.   FOLLOW UP IN 4 MOS.

## 2018-05-16 NOTE — Telephone Encounter (Signed)
LMOM to call.

## 2018-05-20 NOTE — Telephone Encounter (Signed)
Pt notified of results. Please schedule follow up appt and mail to pt.

## 2018-05-21 NOTE — Telephone Encounter (Signed)
Patient scheduled for appointment.

## 2018-05-21 NOTE — Telephone Encounter (Signed)
PA for CTE is pending review via [employer] website. Case # [id number]

## 2018-05-23 NOTE — Telephone Encounter (Signed)
PA for CT was approved. Auth # [id number]. Dates 05/23/18-08/21/18

## 2018-06-04 ENCOUNTER — Ambulatory Visit (HOSPITAL_COMMUNITY): Admission: RE | Admit: 2018-06-04 | Payer: Medicare HMO | Source: Ambulatory Visit

## 2018-07-17 ENCOUNTER — Telehealth: Payer: Self-pay | Admitting: Gastroenterology

## 2018-07-17 ENCOUNTER — Encounter: Payer: Self-pay | Admitting: Gastroenterology

## 2018-07-17 ENCOUNTER — Ambulatory Visit: Payer: Medicare HMO | Admitting: Gastroenterology

## 2018-07-17 NOTE — Telephone Encounter (Signed)
Patient was a no show and letter sent  °

## 2018-07-31 DIAGNOSIS — M79644 Pain in right finger(s): Secondary | ICD-10-CM | POA: Diagnosis not present

## 2018-07-31 DIAGNOSIS — M79641 Pain in right hand: Secondary | ICD-10-CM | POA: Diagnosis not present

## 2018-08-01 ENCOUNTER — Emergency Department (HOSPITAL_COMMUNITY): Payer: Medicare HMO

## 2018-08-01 ENCOUNTER — Emergency Department (HOSPITAL_COMMUNITY)
Admission: EM | Admit: 2018-08-01 | Discharge: 2018-08-01 | Disposition: A | Discharge status: Home / Self Care | Payer: Medicare HMO | Attending: Emergency Medicine | Admitting: Emergency Medicine

## 2018-08-01 ENCOUNTER — Encounter (HOSPITAL_COMMUNITY): Payer: Self-pay | Admitting: Emergency Medicine

## 2018-08-01 DIAGNOSIS — J449 Chronic obstructive pulmonary disease, unspecified: Secondary | ICD-10-CM | POA: Insufficient documentation

## 2018-08-01 DIAGNOSIS — M79641 Pain in right hand: Secondary | ICD-10-CM | POA: Diagnosis not present

## 2018-08-01 DIAGNOSIS — R69 Illness, unspecified: Secondary | ICD-10-CM | POA: Diagnosis not present

## 2018-08-01 DIAGNOSIS — R600 Localized edema: Secondary | ICD-10-CM | POA: Insufficient documentation

## 2018-08-01 DIAGNOSIS — M79644 Pain in right finger(s): Secondary | ICD-10-CM | POA: Diagnosis not present

## 2018-08-01 DIAGNOSIS — F17210 Nicotine dependence, cigarettes, uncomplicated: Secondary | ICD-10-CM | POA: Diagnosis not present

## 2018-08-01 DIAGNOSIS — I10 Essential (primary) hypertension: Secondary | ICD-10-CM | POA: Insufficient documentation

## 2018-08-01 DIAGNOSIS — Z79899 Other long term (current) drug therapy: Secondary | ICD-10-CM | POA: Insufficient documentation

## 2018-08-01 LAB — URIC ACID: URIC ACID, SERUM: 5.5 mg/dL (ref 2.5–7.1)

## 2018-08-01 MED ORDER — CEPHALEXIN 500 MG PO CAPS
500.0000 mg | ORAL_CAPSULE | Freq: Once | ORAL | Status: AC
  Administered 2018-08-01: 500 mg via ORAL
  Filled 2018-08-01: qty 1

## 2018-08-01 MED ORDER — CEPHALEXIN 500 MG PO CAPS: 500.0000 mg | ORAL_CAPSULE | Freq: Three times a day (TID) | ORAL | 0 refills | Status: DC

## 2018-08-01 MED ORDER — IBUPROFEN 400 MG PO TABS
600.0000 mg | ORAL_TABLET | Freq: Once | ORAL | Status: AC
  Administered 2018-08-01: 600 mg via ORAL
  Filled 2018-08-01: qty 2

## 2018-08-01 NOTE — ED Triage Notes (Signed)
Pt c/o right hand pain that started tonight and denies any injury.

## 2018-08-01 NOTE — Discharge Instructions (Addendum)
Use ice and heat on your finger for comfort. You can take ibuprofen 400 mg + acetaminophen 650 mg every 6 hrs for pain as needed. Take the antibiotic until gone. Recheck if it gets worse.

## 2018-08-01 NOTE — ED Provider Notes (Signed)
[facility] EMERGENCY DEPARTMENT Provider Note   CSN: [id number] Arrival date & time: 08/01/18  0021  Time seen 02:40 AM   History   Chief Complaint Chief Complaint  Patient presents with  . Hand Pain    HPI Virginia Washington is a 62 y.o. female.  HPI patient states about 9 PM tonight she was laying down to go to bed and started getting pain in her right index finger.  She states she is right-handed.  She denies any known injury to the finger or hand.  She denies any change in her activity.  She denies any insect bites.  She states she is never had this happen before.  She states it aches and it is swelling.  Patient is right-handed.  She denies any prior history of gout or arthritis.  PCP [name], MD   Past Medical History:  Diagnosis Date  . Chronic back pain   . Constipation   . COPD (chronic obstructive pulmonary disease) ([place])   . DVT (deep venous thrombosis) ([place])   . GERD (gastroesophageal reflux disease)   . Headache   . Hyperlipidemia   . Hypertension   . Normal cardiac stress test 12/2014    Patient Active Problem List   Diagnosis Date Noted  . Esophageal dysphagia   . SBO (small bowel obstruction) ([place]) 04/29/2018  . GERD (gastroesophageal reflux disease) 04/28/2018  . Hypokalemia 04/28/2018  . Pulmonary nodule 04/28/2018  . Hypernatremia 04/28/2018  . Enteritis 04/28/2018  . Encounter for screening colonoscopy 01/16/2018  . Abdominal pain 11/16/2017  . Constipation 11/16/2017  . Partial small bowel obstruction ([place]) 08/16/2016  . Hyperlipidemia 08/16/2016  . Chest pain 12/12/2014  . Hypertension 12/12/2014  . Tobacco abuse 10/29/2011    Past Surgical History:  Procedure Laterality Date  . ABDOMINAL AORTOGRAM W/LOWER EXTREMITY N/A 10/26/2017   Procedure: ABDOMINAL AORTOGRAM W/LOWER EXTREMITY;  Surgeon: Elam Dutch, MD;  Location: [place] [facility];  Service: Cardiovascular;  Laterality: N/A;  . ABDOMINAL HYSTERECTOMY    .  CHOLECYSTECTOMY    . COLONOSCOPY  2007   Dr. Laural Golden: small internal hemorrhoids, otherwise normal. exam could be compromised due to quality of prep  . COLONOSCOPY N/A 02/15/2018   Dr. Oneida Alar: 2 simple adenomas, external and internal hemorrhoids. Next colonoscopy 2024-2026  . ESOPHAGOGASTRODUODENOSCOPY N/A 05/09/2018   Procedure: ESOPHAGOGASTRODUODENOSCOPY (EGD);  Surgeon: Danie Binder, MD;  [facility];  Service: Endoscopy;  Laterality: N/A;  2:15pm- Possible Pyloric Dilation     OB History    Gravida      Para      Term      Preterm      AB      Living  3     SAB      TAB      Ectopic      Multiple      Live Births               Home Medications    Prior to Admission medications   Medication Sig Start Date End Date Taking? Authorizing Provider  albuterol (PROVENTIL HFA;VENTOLIN HFA) 108 (90 Base) MCG/ACT inhaler Inhale 2 puffs into the lungs every 4 (four) hours as needed for wheezing or shortness of breath. 03/01/18   Francine Graven, DO  amLODipine (NORVASC) 5 MG tablet Take 5 mg by mouth daily.    [provider]  amoxicillin-clavulanate (AUGMENTIN) 875-125 MG tablet Take 1 tablet by mouth 2 (two) times daily. 04/30/18  Orson Eva, MD  atorvastatin (LIPITOR) 20 MG tablet Take 1 tablet (20 mg total) by mouth daily. Patient not taking: Reported on 05/01/2018 12/23/14   Fay Records, MD  cephALEXin (KEFLEX) 500 MG capsule Take 1 capsule (500 mg total) by mouth 3 (three) times daily. 08/01/18   Rolland Porter, MD  omeprazole (PRILOSEC) 20 MG capsule 1 PO 30 MINS PRIOR TO BREAKFAST. 05/09/18   Danie Binder, MD    Family History Family History  Problem Relation Age of Onset  . Hypertension Mother   . Colon cancer Neg Hx   . Colon polyps Neg Hx     Social History Social History   Tobacco Use  . Smoking status: Current Every Day Smoker    Packs/day: 0.50    Years: 40.00    Pack years: 20.00    Types: Cigarettes  . Smokeless tobacco:  Never Used  Substance Use Topics  . Alcohol use: No    Alcohol/week: 0.0 standard drinks  . Drug use: No  unemployed widowed   Allergies   Patient has no known allergies.   Review of Systems Review of Systems  All other systems reviewed and are negative.    Physical Exam Updated Vital Signs BP 123/74   Pulse 79   Temp 98.1 F (36.7 C) (Oral)   Resp 19   Ht 5' 2"  (1.575 m)   Wt 59 kg   SpO2 99%   BMI 23.78 kg/m   Vital signs normal    Physical Exam  Constitutional: She is oriented to person, place, and time. She appears well-developed and well-nourished. No distress.  HENT:  Head: Normocephalic and atraumatic.  Right Ear: External ear normal.  Left Ear: External ear normal.  Nose: Nose normal.  Eyes: Conjunctivae and EOM are normal.  Cardiovascular: Normal rate.  Pulmonary/Chest: Effort normal. No respiratory distress.  Musculoskeletal: She exhibits edema and tenderness.  Patient is noted to have some mild diffuse swelling of her right index finger with a lot of pain over the MCP joint to palpation.  When I flex her finger she states it hurts in the PIP joint.  There is may be some mild redness to the skin.  I do not see any obvious lesions such as a insect bite.  Neurological: She is alert and oriented to person, place, and time. No cranial nerve deficit.  Skin: Skin is warm and dry. Capillary refill takes less than 2 seconds. No rash noted. No erythema.  Psychiatric: She has a normal mood and affect. Her behavior is normal.  Nursing note and vitals reviewed.      ED Treatments / Results  Labs (all labs ordered are listed, but only abnormal results are displayed)  Results for orders placed or performed during the hospital encounter of 08/01/18  Uric acid  Result Value Ref Range   Uric Acid, Serum 5.5 2.5 - 7.1 mg/dL   Laboratory interpretation all normal   EKG None  Radiology Dg Hand Complete Right  Result Date: 08/01/2018 CLINICAL DATA:   Second digit pain, initial encounter EXAM: RIGHT HAND - COMPLETE 3+ VIEW COMPARISON:  09/26/2006 FINDINGS: No acute fracture or dislocation is noted. No soft tissue abnormality is seen. Radiopaque foreign body is noted adjacent to the fifth metacarpal stable from the prior exam. IMPRESSION: No acute abnormality noted. Electronically Signed   By: Inez Catalina M.D.   On: 08/01/2018 01:07    Procedures Procedures (including critical care time)  Medications Ordered in ED Medications  ibuprofen (ADVIL,MOTRIN) tablet 600 mg (has no administration in time range)  cephALEXin (KEFLEX) capsule 500 mg (has no administration in time range)     Initial Impression / Assessment and Plan / ED Course  I have reviewed the triage vital signs and the nursing notes.  Pertinent labs & imaging results that were available during my care of the patient were reviewed by me and considered in my medical decision making (see chart for details).   Patient had a be met in May and her BUN was less than 5 and creatinine was 0.88.  She was given ibuprofen for pain.  Uric acid level was drawn.  Patient's uric acid level was normal.  At this point I am not clear as to what is the source of her pain and swelling of her finger.  I am going start her on antibiotics for possible early cellulitis, she does not describe itching that would imply an insect bite.  At 350 I gave patient the results of her uric acid level.  She states she has not gotten her ibuprofen that was written yet.  Final Clinical Impressions(s) / ED Diagnoses   Final diagnoses:  Pain of finger of right hand    ED Discharge Orders         Ordered    cephALEXin (KEFLEX) 500 MG capsule  3 times daily     08/01/18 0408        OTC ibuprofen and acetaminophen  Plan discharge  Rolland Porter, MD, Barbette Or, MD 08/01/18 [id number]

## 2018-08-26 DIAGNOSIS — I10 Essential (primary) hypertension: Secondary | ICD-10-CM | POA: Diagnosis not present

## 2018-08-26 DIAGNOSIS — R69 Illness, unspecified: Secondary | ICD-10-CM | POA: Diagnosis not present

## 2018-08-26 DIAGNOSIS — I739 Peripheral vascular disease, unspecified: Secondary | ICD-10-CM | POA: Diagnosis not present

## 2018-08-26 DIAGNOSIS — E785 Hyperlipidemia, unspecified: Secondary | ICD-10-CM | POA: Diagnosis not present

## 2018-09-19 ENCOUNTER — Other Ambulatory Visit: Payer: Self-pay | Admitting: *Deleted

## 2018-09-19 ENCOUNTER — Telehealth: Payer: Self-pay | Admitting: Cardiology

## 2018-09-19 ENCOUNTER — Ambulatory Visit (HOSPITAL_COMMUNITY)
Admission: RE | Admit: 2018-09-19 | Discharge: 2018-09-19 | Disposition: A | Discharge status: Home / Self Care | Payer: Medicare HMO | Source: Ambulatory Visit | Attending: Vascular Surgery | Admitting: Vascular Surgery

## 2018-09-19 ENCOUNTER — Other Ambulatory Visit: Payer: Self-pay | Admitting: Vascular Surgery

## 2018-09-19 ENCOUNTER — Encounter: Payer: Self-pay | Admitting: *Deleted

## 2018-09-19 ENCOUNTER — Encounter: Payer: Self-pay | Admitting: Vascular Surgery

## 2018-09-19 ENCOUNTER — Ambulatory Visit (INDEPENDENT_AMBULATORY_CARE_PROVIDER_SITE_OTHER): Payer: Medicare HMO | Admitting: Vascular Surgery

## 2018-09-19 ENCOUNTER — Other Ambulatory Visit: Payer: Self-pay

## 2018-09-19 VITALS — BP 107/63 | HR 63 | Temp 98.0°F | Resp 18 | Ht 60.5 in | Wt 136.4 lb

## 2018-09-19 DIAGNOSIS — E785 Hyperlipidemia, unspecified: Secondary | ICD-10-CM | POA: Insufficient documentation

## 2018-09-19 DIAGNOSIS — F17200 Nicotine dependence, unspecified, uncomplicated: Secondary | ICD-10-CM | POA: Insufficient documentation

## 2018-09-19 DIAGNOSIS — M79606 Pain in leg, unspecified: Secondary | ICD-10-CM | POA: Insufficient documentation

## 2018-09-19 DIAGNOSIS — I10 Essential (primary) hypertension: Secondary | ICD-10-CM | POA: Insufficient documentation

## 2018-09-19 DIAGNOSIS — R69 Illness, unspecified: Secondary | ICD-10-CM | POA: Diagnosis not present

## 2018-09-19 DIAGNOSIS — I739 Peripheral vascular disease, unspecified: Secondary | ICD-10-CM | POA: Diagnosis not present

## 2018-09-19 NOTE — Telephone Encounter (Signed)
° ° °  ° °  [facility] Pre-operative Risk Assessment    Request for surgical clearance:  What type of surgery is being performed?Aortagram bilateral runoff 1. When is this surgery scheduled? 09/27/2018  2. What type of clearance is required (medical clearance vs. Pharmacy clearance to hold med vs. Both)?BOTH  3. Are there any medications that need to be held prior to surgery and how long?  4. Practice name and name of physician performing surgery? Dr [name]  5. What is your office phone number [phone]   7.   What is your office fax number [phone]  8.   Anesthesia type (None, local, MAC, general) ? n/a   Virginia Washington 09/19/2018, 3:51 PM  _________________________________________________________________   (provider comments below)

## 2018-09-19 NOTE — H&P (View-Only) (Signed)
Patient is a 62 year old female who returns today for follow-up.  She was last seen November 2018.  She has a known left common iliac artery occlusion.  She was previously offered a femoral-femoral bypass last November but declined.  She continues to complain of now 3-1/2-year history of 1 block claudication in her left leg.  Primarily she experiences tightness in the left hip thigh and calf.  Unfortunately she continues to smoke.  She denies rest pain.  She has no nonhealing wounds.  Chronic medical problems remain tobacco abuse, COPD, chronic back pain, hyperlipidemia, hypertension all currently stable.  Review of systems: She states she has occasional chest pain with exertion.  She also gets short of breath with exertion.  Past Medical History:  Diagnosis Date  . Chronic back pain   . Constipation   . COPD (chronic obstructive pulmonary disease) ([place])   . DVT (deep venous thrombosis) ([place])   . GERD (gastroesophageal reflux disease)   . Headache   . Hyperlipidemia   . Hypertension   . Normal cardiac stress test 12/2014    Past Surgical History:  Procedure Laterality Date  . ABDOMINAL AORTOGRAM W/LOWER EXTREMITY N/A 10/26/2017   Procedure: ABDOMINAL AORTOGRAM W/LOWER EXTREMITY;  Surgeon: Elam Dutch, MD;  Location: [place] [facility];  Service: Cardiovascular;  Laterality: N/A;  . ABDOMINAL HYSTERECTOMY    . CHOLECYSTECTOMY    . COLONOSCOPY  2007   Dr. Laural Golden: small internal hemorrhoids, otherwise normal. exam could be compromised due to quality of prep  . COLONOSCOPY N/A 02/15/2018   Dr. Oneida Alar: 2 simple adenomas, external and internal hemorrhoids. Next colonoscopy 2024-2026  . ESOPHAGOGASTRODUODENOSCOPY N/A 05/09/2018   Procedure: ESOPHAGOGASTRODUODENOSCOPY (EGD);  Surgeon: Danie Binder, MD;  [facility];  Service: Endoscopy;  Laterality: N/A;  2:15pm- Possible Pyloric Dilation     Current Outpatient Medications on File Prior to Visit  Medication Sig Dispense  Refill  . albuterol (PROVENTIL HFA;VENTOLIN HFA) 108 (90 Base) MCG/ACT inhaler Inhale 2 puffs into the lungs every 4 (four) hours as needed for wheezing or shortness of breath. 1 Inhaler 0  . amLODipine (NORVASC) 5 MG tablet Take 5 mg by mouth daily.    Marland Kitchen atorvastatin (LIPITOR) 20 MG tablet Take 1 tablet (20 mg total) by mouth daily. 90 tablet 3  . omeprazole (PRILOSEC) 20 MG capsule 1 PO 30 MINS PRIOR TO BREAKFAST. 90 capsule 3  . amoxicillin-clavulanate (AUGMENTIN) 875-125 MG tablet Take 1 tablet by mouth 2 (two) times daily. (Patient not taking: Reported on 09/19/2018) 8 tablet 0  . cephALEXin (KEFLEX) 500 MG capsule Take 1 capsule (500 mg total) by mouth 3 (three) times daily. (Patient not taking: Reported on 09/19/2018) 30 capsule 0   No current facility-administered medications on file prior to visit.    Social History   Socioeconomic History  . Marital status: Widowed    Spouse name: Not on file  . Number of children: Not on file  . Years of education: Not on file  . Highest education level: Not on file  Occupational History  . Occupation: unemployed  Social Needs  . Financial resource strain: Not on file  . Food insecurity:    Worry: Not on file    Inability: Not on file  . Transportation needs:    Medical: Not on file    Non-medical: Not on file  Tobacco Use  . Smoking status: Current Every Day Smoker    Packs/day: 0.25    Years: 40.00  Pack years: 10.00    Types: Cigarettes  . Smokeless tobacco: Never Used  Substance and Sexual Activity  . Alcohol use: No    Alcohol/week: 0.0 standard drinks  . Drug use: No  . Sexual activity: Not Currently  Lifestyle  . Physical activity:    Days per week: Not on file    Minutes per session: Not on file  . Stress: Not on file  Relationships  . Social connections:    Talks on phone: Not on file    Gets together: Not on file    Attends religious service: Not on file    Active member of club or organization: Not on file     Attends meetings of clubs or organizations: Not on file    Relationship status: Not on file  . Intimate partner violence:    Fear of current or ex partner: Not on file    Emotionally abused: Not on file    Physically abused: Not on file    Forced sexual activity: Not on file  Other Topics Concern  . Not on file  Social History Narrative  . Not on file    Family History  Problem Relation Age of Onset  . Hypertension Mother   . Colon cancer Neg Hx   . Colon polyps Neg Hx     Physical exam:  Vitals:   09/19/18 1229  BP: 107/63  Pulse: 63  Resp: 18  Temp: 98 F (36.7 C)  TempSrc: Oral  SpO2: 100%  Weight: 136 lb 6.4 oz (61.9 kg)  Height: 5' 0.5" (1.537 m)    Chest: Clear to auscultation bilaterally  Cardiac: Regular rate and rhythm  Neck: No carotid bruit  Abdomen: Soft nontender  Extremities: 2+ right femoral pulse 2+ right dorsalis pedis pulse absent femoral pedal pulses left leg  Skin: No ulcer  ABIs from November 2018 were reviewed left side was 0.75 right side was 1.06  Assessment: Known left common iliac artery occlusion.  Her arteriogram was over a year ago.  She now wishes to pursue revascularization.  Plan: The patient needs to be started on aspirin daily for her peripheral arterial disease.    #2 again emphasized smoking cessation.    #3 we will schedule her for repeat arteriogram lower extremity runoff on September 27, 2018.  Risk benefits possible complications procedure details discussed with the patient today.  These include but are not limited to bleeding infection contrast reaction.  The patient will also be scheduled for cardiac risk stratification prior to most likely femoral-femoral bypass.  [name], MD [facility] Office: [phone] Pager: [phone]

## 2018-09-19 NOTE — Progress Notes (Signed)
Patient is a 62 year old female who returns today for follow-up.  She was last seen November 2018.  She has a known left common iliac artery occlusion.  She was previously offered a femoral-femoral bypass last November but declined.  She continues to complain of now 3-1/2-year history of 1 block claudication in her left leg.  Primarily she experiences tightness in the left hip thigh and calf.  Unfortunately she continues to smoke.  She denies rest pain.  She has no nonhealing wounds.  Chronic medical problems remain tobacco abuse, COPD, chronic back pain, hyperlipidemia, hypertension all currently stable.  Review of systems: She states she has occasional chest pain with exertion.  She also gets short of breath with exertion.  Past Medical History:  Diagnosis Date  . Chronic back pain   . Constipation   . COPD (chronic obstructive pulmonary disease) ([place])   . DVT (deep venous thrombosis) ([place])   . GERD (gastroesophageal reflux disease)   . Headache   . Hyperlipidemia   . Hypertension   . Normal cardiac stress test 12/2014    Past Surgical History:  Procedure Laterality Date  . ABDOMINAL AORTOGRAM W/LOWER EXTREMITY N/A 10/26/2017   Procedure: ABDOMINAL AORTOGRAM W/LOWER EXTREMITY;  Surgeon: Elam Dutch, MD;  Location: [facility] [facility];  Service: Cardiovascular;  Laterality: N/A;  . ABDOMINAL HYSTERECTOMY    . CHOLECYSTECTOMY    . COLONOSCOPY  2007   Dr. Laural Golden: small internal hemorrhoids, otherwise normal. exam could be compromised due to quality of prep  . COLONOSCOPY N/A 02/15/2018   Dr. Oneida Alar: 2 simple adenomas, external and internal hemorrhoids. Next colonoscopy 2024-2026  . ESOPHAGOGASTRODUODENOSCOPY N/A 05/09/2018   Procedure: ESOPHAGOGASTRODUODENOSCOPY (EGD);  Surgeon: Danie Binder, MD;  [facility];  Service: Endoscopy;  Laterality: N/A;  2:15pm- Possible Pyloric Dilation     Current Outpatient Medications on File Prior to Visit  Medication Sig Dispense  Refill  . albuterol (PROVENTIL HFA;VENTOLIN HFA) 108 (90 Base) MCG/ACT inhaler Inhale 2 puffs into the lungs every 4 (four) hours as needed for wheezing or shortness of breath. 1 Inhaler 0  . amLODipine (NORVASC) 5 MG tablet Take 5 mg by mouth daily.    Marland Kitchen atorvastatin (LIPITOR) 20 MG tablet Take 1 tablet (20 mg total) by mouth daily. 90 tablet 3  . omeprazole (PRILOSEC) 20 MG capsule 1 PO 30 MINS PRIOR TO BREAKFAST. 90 capsule 3  . amoxicillin-clavulanate (AUGMENTIN) 875-125 MG tablet Take 1 tablet by mouth 2 (two) times daily. (Patient not taking: Reported on 09/19/2018) 8 tablet 0  . cephALEXin (KEFLEX) 500 MG capsule Take 1 capsule (500 mg total) by mouth 3 (three) times daily. (Patient not taking: Reported on 09/19/2018) 30 capsule 0   No current facility-administered medications on file prior to visit.    Social History   Socioeconomic History  . Marital status: Widowed    Spouse name: Not on file  . Number of children: Not on file  . Years of education: Not on file  . Highest education level: Not on file  Occupational History  . Occupation: unemployed  Social Needs  . Financial resource strain: Not on file  . Food insecurity:    Worry: Not on file    Inability: Not on file  . Transportation needs:    Medical: Not on file    Non-medical: Not on file  Tobacco Use  . Smoking status: Current Every Day Smoker    Packs/day: 0.25    Years: 40.00  Pack years: 10.00    Types: Cigarettes  . Smokeless tobacco: Never Used  Substance and Sexual Activity  . Alcohol use: No    Alcohol/week: 0.0 standard drinks  . Drug use: No  . Sexual activity: Not Currently  Lifestyle  . Physical activity:    Days per week: Not on file    Minutes per session: Not on file  . Stress: Not on file  Relationships  . Social connections:    Talks on phone: Not on file    Gets together: Not on file    Attends religious service: Not on file    Active member of club or organization: Not on file     Attends meetings of clubs or organizations: Not on file    Relationship status: Not on file  . Intimate partner violence:    Fear of current or ex partner: Not on file    Emotionally abused: Not on file    Physically abused: Not on file    Forced sexual activity: Not on file  Other Topics Concern  . Not on file  Social History Narrative  . Not on file    Family History  Problem Relation Age of Onset  . Hypertension Mother   . Colon cancer Neg Hx   . Colon polyps Neg Hx     Physical exam:  Vitals:   09/19/18 1229  BP: 107/63  Pulse: 63  Resp: 18  Temp: 98 F (36.7 C)  TempSrc: Oral  SpO2: 100%  Weight: 136 lb 6.4 oz (61.9 kg)  Height: 5' 0.5" (1.537 m)    Chest: Clear to auscultation bilaterally  Cardiac: Regular rate and rhythm  Neck: No carotid bruit  Abdomen: Soft nontender  Extremities: 2+ right femoral pulse 2+ right dorsalis pedis pulse absent femoral pedal pulses left leg  Skin: No ulcer  ABIs from November 2018 were reviewed left side was 0.75 right side was 1.06  Assessment: Known left common iliac artery occlusion.  Her arteriogram was over a year ago.  She now wishes to pursue revascularization.  Plan: The patient needs to be started on aspirin daily for her peripheral arterial disease.    #2 again emphasized smoking cessation.    #3 we will schedule her for repeat arteriogram lower extremity runoff on September 27, 2018.  Risk benefits possible complications procedure details discussed with the patient today.  These include but are not limited to bleeding infection contrast reaction.  The patient will also be scheduled for cardiac risk stratification prior to most likely femoral-femoral bypass.  [name], MD [facility] Office: [phone] Pager: [phone]

## 2018-09-19 NOTE — Telephone Encounter (Signed)
Pt has new pt appointment with Dr. Agustin Cree on 09/23/18 for surgical clearance.   Will notify requesting MD of pending status.

## 2018-09-23 ENCOUNTER — Encounter: Payer: Self-pay | Admitting: Cardiology

## 2018-09-23 ENCOUNTER — Ambulatory Visit: Payer: Medicare HMO | Admitting: Cardiology

## 2018-09-23 ENCOUNTER — Ambulatory Visit (INDEPENDENT_AMBULATORY_CARE_PROVIDER_SITE_OTHER): Payer: Medicare HMO | Admitting: Cardiology

## 2018-09-23 VITALS — BP 124/77 | HR 72 | Ht 65.0 in | Wt 134.2 lb

## 2018-09-23 DIAGNOSIS — I10 Essential (primary) hypertension: Secondary | ICD-10-CM

## 2018-09-23 DIAGNOSIS — E782 Mixed hyperlipidemia: Secondary | ICD-10-CM | POA: Diagnosis not present

## 2018-09-23 DIAGNOSIS — R072 Precordial pain: Secondary | ICD-10-CM

## 2018-09-23 DIAGNOSIS — Z01810 Encounter for preprocedural cardiovascular examination: Secondary | ICD-10-CM | POA: Diagnosis not present

## 2018-09-23 DIAGNOSIS — I739 Peripheral vascular disease, unspecified: Secondary | ICD-10-CM | POA: Diagnosis not present

## 2018-09-23 DIAGNOSIS — Z720 Tobacco use: Secondary | ICD-10-CM

## 2018-09-23 NOTE — Assessment & Plan Note (Signed)
I do not see any labs since 2016 2017.  Clearly with PAD, this should be treated.  She is on atorvastatin and I do not see recent labs.  I am not sure this is being checked by PCP.   Marland Kitchen For now we will continue current dose of statin, but would need follow-up labs.

## 2018-09-23 NOTE — Assessment & Plan Note (Signed)
She has atypical sounding chest discomfort symptoms but does not sound cardiac however she really does not walk enough to know if she is truly having any exertional chest pain or pressure or dyspnea.  With her having relatively significant PAD as well as hypertension and hyperlipidemia, warrants ischemic evaluation. Would not be to walk on treadmill. This limits [place] to the [place] stress test.  (Would prefer coronary CT angiogram, however in the anxious of time we will proceed with Myoview. If nonischemic, would be okay to proceed with surgery.  She does not have diabetes, heart failure, history of known CAD, renal disease.  Therefore an intermediate risk surgery with no other cardiac risk factors would be still low risk for cardiac adverse events. To assess baseline cardia vascular functional status we will check Lexiscan Myoview.

## 2018-09-23 NOTE — Assessment & Plan Note (Signed)
Intermittent chest discomfort symptoms do not sound cardiac in nature, however as were unable to really assess any exertional symptoms, we will proceed with Lexiscan Myoview just to exclude myocardial ischemia as cause for symptoms.

## 2018-09-23 NOTE — Progress Notes (Signed)
PCP: [name], MD  Clinic Note: Chief Complaint  Patient presents with  . Pre-op Exam    fo Vascular Sgx  . Claudication    HPI: Virginia Washington is a 62 y.o. female with a PMH below who presents today for Pre-OP Clearance.. She is being seen at the request of Elam Dutch, MD  From [facility].  Virginia Washington was last seen on September 19, 2018 for PAD follow-up.  Last seen before that was in November 2018 following her abdominal angiogram.  Was offered femoral-femoral bypass but declined at the time.  Now continues to have significant lifestyle limiting claudication with less than a block walking.  No resting pain. ->  Plan is relook angiography with likely planned Femorofemoral Bypass.  Requested Preop Cardiac Evaluation.  Recent Hospitalizations:   Admitted for small bowel obstruction in May 2019 --> readmitted for abdominal pain  Studies Personally Reviewed - (if available, images/films reviewed: From Epic Chart or Care Everywhere)  Myoview January 2016 [facility]): Non-ischemic.  Normal EF.  (Report not visible)  Transthoracic Echo January 2013: EF 55 to 60%.  No regional wall motion normality.  GRII DD.  No significant valve disease.  ABDOMINAL AORTIC ANGIOGRAM WITH LOWER EXTREMITY RUNOFF November 05, 2017  Chronic occlusion left common iliac artery with reconstitution of the distal common iliac just above iliac bifurcation.  Three-vessel left lower extremity runoff.  Unable to cross for percutaneous intervention.  Normal right lower extremity flow -three-vessel runoff.  Interval History: Virginia Washington is a pleasant 63 year old woman with family history of PAD who also has PAD that is occlusive along with hypertension and probably hyperlipidemia.  She is also a chronic smoker.  She has essentially lifestyle limiting claudication not able to walk more than 1 block.  As such, she really is not sure if she has any exertional dyspnea comes for at least the last year she has not  been able to do anything.  She really has not had any symptoms of chest pain or dyspnea with whatever activity she does.  No PND, orthopnea with maybe trace lower external edema.  She does not obviously get short of breath when she walks, but her claudication is what stops her and not dyspnea or chest pain.  She does have off-and-on precordial chest pain but it really is not associated with anything in particular.  Not necessarily with exertion.  Happens often at rest.  She had not thought about it until I asked her. She really denies any other cardiac symptoms.  No PND, orthopnea or edema. No palpitations, lightheadedness, dizziness, weakness or syncope/near syncope. No TIA/amaurosis fugax symptoms. No melena, hematochezia, hematuria, or epstaxis.   ROS: A comprehensive was performed. Review of Systems  Constitutional: Negative for chills, fever, malaise/fatigue (Just does not do much so therefore not sure if she is tired or not.) and weight loss.  HENT: Negative for congestion and nosebleeds.   Respiratory: Positive for cough (She does have occasional morning cough) and shortness of breath (Mild at baseline, but nothing worrisome).   Cardiovascular:       Per HPI  Gastrointestinal: Negative for blood in stool, constipation and melena.       Seems to have recovered from small bowel obstruction.  No further GI issues.  Genitourinary: Negative for dysuria, frequency and hematuria.  Musculoskeletal: Negative for falls.  Neurological: Negative for dizziness, focal weakness and weakness.  Psychiatric/Behavioral: Negative.   All other systems reviewed and are negative.   I have  reviewed and (if needed) personally updated the patient's problem list, medications, allergies, past medical and surgical history, social and family history.   Past Medical History:  Diagnosis Date  . Chronic back pain   . Constipation   . COPD (chronic obstructive pulmonary disease) ([place])   . DVT (deep venous  thrombosis) ([place])   . GERD (gastroesophageal reflux disease)   . Headache   . Hyperlipidemia   . Hypertension     Past Surgical History:  Procedure Laterality Date  . ABDOMINAL AORTOGRAM W/LOWER EXTREMITY N/A 10/26/2017   Procedure: ABDOMINAL AORTOGRAM W/LOWER EXTREMITY;  Surgeon: Elam Dutch, MD;  Location: [place] [facility];  Service: Cardiovascular: 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  Normal right aortoiliac vessels.  Three-vessel runoff bilaterally.  (Sluggish flow of the left side due to occlusion.  . ABDOMINAL HYSTERECTOMY    . CHOLECYSTECTOMY    . COLONOSCOPY  2007   Dr. Laural Golden: small internal hemorrhoids, otherwise normal. exam could be compromised due to quality of prep  . COLONOSCOPY N/A 02/15/2018   Dr. Oneida Alar: 2 simple adenomas, external and internal hemorrhoids. Next colonoscopy 2024-2026  . ESOPHAGOGASTRODUODENOSCOPY N/A 05/09/2018   Procedure: ESOPHAGOGASTRODUODENOSCOPY (EGD);  Surgeon: Danie Binder, MD;  [facility];  Service: Endoscopy;  Laterality: N/A;  2:15pm- Possible Pyloric Dilation  . NM MYOVIEW LTD  12/2014   [facility]: Normal Myoview.  Nonischemic.  Marland Kitchen TRANSTHORACIC ECHOCARDIOGRAM  12/2011   EF 55 to 60%.  No regional wall motion normality.  GRII DD.  No significant valve disease.    Current Meds  Medication Sig  . amLODipine (NORVASC) 5 MG tablet Take 5 mg by mouth daily.  Marland Kitchen atorvastatin (LIPITOR) 20 MG tablet Take 1 tablet (20 mg total) by mouth daily.  Marland Kitchen linaclotide (LINZESS) 290 MCG CAPS capsule Take 290 mcg by mouth at bedtime.    No Known Allergies  Social History   Tobacco Use  . Smoking status: Current Every Day Smoker    Packs/day: 0.25    Years: 40.00    Pack years: 10.00    Types: Cigarettes  . Smokeless tobacco: Never Used  Substance Use Topics  . Alcohol use: No    Alcohol/week: 0.0 standard drinks  . Drug use: No   Social History    Social History Narrative   Widowed mother 35, grandmother of 62 with one great grandchild.  She currently lives alone.  She completed ninth grade.  Smokes a pack a day for 42 years.  Tries to walks for about 5 minutes at x7 days a week with plans to help revascularization.    family history includes Hypertension in her mother; Peripheral Artery Disease in her brother.  Wt Readings from Last 3 Encounters:  09/23/18 134 lb 3.2 oz (60.9 kg)  09/19/18 136 lb 6.4 oz (61.9 kg)  08/01/18 130 lb (59 kg)    PHYSICAL EXAM BP 124/77 (BP Location: Right Arm)   Pulse 72   Ht 5\' 5"  (1.651 m)   Wt 134 lb 3.2 oz (60.9 kg)   BMI 22.33 kg/m  Physical Exam  Constitutional: She is oriented to person, place, and time. She appears well-developed and well-nourished. No distress.  Well-groomed.  HENT:  Head: Normocephalic and atraumatic.  Mouth/Throat: Oropharynx is clear and moist.  Poor dentition.  Only 1 or 2 teeth.  Eyes: Conjunctivae and EOM are normal.  Neck: Normal range of motion. Neck supple. No hepatojugular reflux  and no JVD present. Carotid bruit is not present.  Cardiovascular: Normal rate, regular rhythm and normal heart sounds.  No extrasystoles are present. PMI is not displaced. Exam reveals decreased pulses (L LE pulses ~1+). Exam reveals no gallop and no friction rub.  No murmur heard. Pulmonary/Chest: Effort normal and breath sounds normal. No respiratory distress. She has no wheezes. She has no rales.  Abdominal: Soft. Bowel sounds are normal. She exhibits no distension. There is no tenderness.  Musculoskeletal: Normal range of motion. She exhibits no edema or deformity.  Neurological: She is alert and oriented to person, place, and time. No cranial nerve deficit.  Skin: Skin is warm and dry. No rash noted. No erythema.  Psychiatric: She has a normal mood and affect. Her behavior is normal. Judgment and thought content normal.  Vitals reviewed.    Adult ECG Report  Rate: 57 ;   Rhythm: sinus bradycardia and Otherwise normal axis, intervals and durations.;   Narrative Interpretation:  relatively normal EKG   Other studies Reviewed: Additional studies/ records that were reviewed today include:  Recent Labs:   Lab Results  Component Value Date   CHOL 215 (H) 12/21/2014   HDL 45 12/21/2014   LDLCALC 151 (H) 12/21/2014   TRIG 96 12/21/2014   CHOLHDL 4.8 12/21/2014   Lab Results  Component Value Date   CREATININE 0.88 04/30/2018   BUN <5 (L) 04/30/2018   NA 139 04/30/2018   K 3.8 04/30/2018   CL 106 04/30/2018   CO2 26 04/30/2018    ASSESSMENT / PLAN: Problem List Items Addressed This Visit    Chest pain (Chronic)    Intermittent chest discomfort symptoms do not sound cardiac in nature, however as were unable to really assess any exertional symptoms, we will proceed with Lexiscan Myoview just to exclude myocardial ischemia as cause for symptoms.      Relevant Orders   EKG 12-Lead   MYOCARDIAL PERFUSION IMAGING   Hyperlipidemia (Chronic)    I do not see any labs since 2016 2017.  Clearly with PAD, this should be treated.  She is on atorvastatin and I do not see recent labs.  I am not sure this is being checked by PCP.   Marland Kitchen For now we will continue current dose of statin, but would need follow-up labs.      Hypertension (Chronic)    Blood pressure looks great on amlodipine.      PAD (peripheral artery disease) (HCC) -left iliac occlusion (Chronic)    Lifestyle limiting claudication from occluded left iliac artery.  Her claudication limits her ability to walk and therefore would not able to fully assess her cardiac functional status.  She does probably need surgical revascularization.  Femorofemoral bypass surgery would be at least an intermediate surgery and based on her being vascular would warrant ischemic evaluation preoperatively.  Plan Lexiscan Myoview.      Preop cardiovascular exam - Primary    She has atypical sounding chest discomfort  symptoms but does not sound cardiac however she really does not walk enough to know if she is truly having any exertional chest pain or pressure or dyspnea.  With her having relatively significant PAD as well as hypertension and hyperlipidemia, warrants ischemic evaluation. Would not be to walk on treadmill. This limits [place] to the [place] stress test.  (Would prefer coronary CT angiogram, however in the anxious of time we will proceed with Myoview. If nonischemic, would be okay to proceed with surgery.  She  does not have diabetes, heart failure, history of known CAD, renal disease.  Therefore an intermediate risk surgery with no other cardiac risk factors would be still low risk for cardiac adverse events. To assess baseline cardia vascular functional status we will check Lexiscan Myoview.      Relevant Orders   EKG 12-Lead   MYOCARDIAL PERFUSION IMAGING   Tobacco abuse (Chronic)    She has been lectured by vascular surgery earlier this week.  I also reiterated the correlation between her occluded iliac artery and her smoking being almost one-to-one correlation.  Is very important that she quit smoking otherwise any restoration procedures will likely be doomed from the beginning. She really did not seem interested in discussing this.          I spent a total of 45 minutes with the patient and chart review. >  50% of the time was spent in direct patient consultation.   Chart review - Echo, Myoivew & clinic notes.    Current medicines are reviewed at length with the patient today.  (+/- concerns) n/a The following changes have been made:  n/a  Patient Instructions  Medication Instructions:  NO MEDICATION CHANGES  If you need a refill on your cardiac medications before your next appointment, please call your pharmacy.   Lab work: NOT NEEDED   Testing/Procedures: SCHEDULE AT [place] has requested that you have a lexiscan myoview. For  further information please visit [email or web address]. Please follow instruction sheet, as given.  WILL KNOW THE ANSWER TO PRE OP CLEARANCE ONCE TEST IS COMPLETED.     Follow-Up: Your physician recommends that you schedule a follow-up appointment in [place]. Any Other Special Instructions Will Be Listed Below (If Applicable). N/A      Studies Ordered:   Orders Placed This Encounter  Procedures  . MYOCARDIAL PERFUSION IMAGING  . EKG 12-Lead      Glenetta Hew, M.D., M.S. Interventional Cardiologist   Pager # [phone] Phone # [phone] [address] [place], [place] [address]   Thank you for choosing [facility]!!

## 2018-09-23 NOTE — Patient Instructions (Signed)
Medication Instructions:  NO MEDICATION CHANGES  If you need a refill on your cardiac medications before your next appointment, please call your pharmacy.   Lab work: NOT NEEDED   Testing/Procedures: SCHEDULE AT [place] has requested that you have a lexiscan myoview. For further information please visit [email or web address]. Please follow instruction sheet, as given.  WILL KNOW THE ANSWER TO PRE OP CLEARANCE ONCE TEST IS COMPLETED.     Follow-Up: Your physician recommends that you schedule a follow-up appointment in [place]. Any Other Special Instructions Will Be Listed Below (If Applicable). N/A

## 2018-09-23 NOTE — Assessment & Plan Note (Signed)
Lifestyle limiting claudication from occluded left iliac artery.  Her claudication limits her ability to walk and therefore would not able to fully assess her cardiac functional status.  She does probably need surgical revascularization.  Femorofemoral bypass surgery would be at least an intermediate surgery and based on her being vascular would warrant ischemic evaluation preoperatively.  Plan Lexiscan Myoview.

## 2018-09-23 NOTE — Assessment & Plan Note (Signed)
She has been lectured by vascular surgery earlier this week.  I also reiterated the correlation between her occluded iliac artery and her smoking being almost one-to-one correlation.  Is very important that she quit smoking otherwise any restoration procedures will likely be doomed from the beginning. She really did not seem interested in discussing this.

## 2018-09-23 NOTE — Assessment & Plan Note (Signed)
Blood pressure looks great on amlodipine.

## 2018-09-24 ENCOUNTER — Other Ambulatory Visit: Payer: Self-pay | Admitting: *Deleted

## 2018-09-24 DIAGNOSIS — Z01810 Encounter for preprocedural cardiovascular examination: Secondary | ICD-10-CM

## 2018-09-24 DIAGNOSIS — R072 Precordial pain: Secondary | ICD-10-CM

## 2018-09-24 NOTE — Progress Notes (Signed)
NEW ORDER PLACED PATIENT WILL BE HAVING TEST DONE AT [facility]

## 2018-09-27 ENCOUNTER — Ambulatory Visit (HOSPITAL_COMMUNITY)
Admission: RE | Admit: 2018-09-27 | Discharge: 2018-09-27 | Disposition: A | Discharge status: Home / Self Care | Payer: Medicare HMO | Source: Ambulatory Visit | Attending: Vascular Surgery | Admitting: Vascular Surgery

## 2018-09-27 ENCOUNTER — Other Ambulatory Visit: Payer: Self-pay

## 2018-09-27 ENCOUNTER — Encounter (HOSPITAL_COMMUNITY)
Admission: RE | Disposition: A | Discharge status: Home / Self Care | Payer: Self-pay | Source: Ambulatory Visit | Attending: Vascular Surgery

## 2018-09-27 ENCOUNTER — Other Ambulatory Visit: Payer: Self-pay | Admitting: *Deleted

## 2018-09-27 DIAGNOSIS — M549 Dorsalgia, unspecified: Secondary | ICD-10-CM | POA: Insufficient documentation

## 2018-09-27 DIAGNOSIS — F17210 Nicotine dependence, cigarettes, uncomplicated: Secondary | ICD-10-CM | POA: Diagnosis not present

## 2018-09-27 DIAGNOSIS — J449 Chronic obstructive pulmonary disease, unspecified: Secondary | ICD-10-CM | POA: Diagnosis not present

## 2018-09-27 DIAGNOSIS — Z86718 Personal history of other venous thrombosis and embolism: Secondary | ICD-10-CM | POA: Insufficient documentation

## 2018-09-27 DIAGNOSIS — I739 Peripheral vascular disease, unspecified: Secondary | ICD-10-CM | POA: Diagnosis present

## 2018-09-27 DIAGNOSIS — R69 Illness, unspecified: Secondary | ICD-10-CM | POA: Diagnosis not present

## 2018-09-27 DIAGNOSIS — Z8249 Family history of ischemic heart disease and other diseases of the circulatory system: Secondary | ICD-10-CM | POA: Insufficient documentation

## 2018-09-27 DIAGNOSIS — E785 Hyperlipidemia, unspecified: Secondary | ICD-10-CM | POA: Diagnosis not present

## 2018-09-27 DIAGNOSIS — G8929 Other chronic pain: Secondary | ICD-10-CM | POA: Diagnosis not present

## 2018-09-27 DIAGNOSIS — K219 Gastro-esophageal reflux disease without esophagitis: Secondary | ICD-10-CM | POA: Diagnosis not present

## 2018-09-27 DIAGNOSIS — I70212 Atherosclerosis of native arteries of extremities with intermittent claudication, left leg: Secondary | ICD-10-CM | POA: Diagnosis not present

## 2018-09-27 DIAGNOSIS — I10 Essential (primary) hypertension: Secondary | ICD-10-CM | POA: Insufficient documentation

## 2018-09-27 HISTORY — PX: LOWER EXTREMITY ANGIOGRAPHY: CATH118251

## 2018-09-27 LAB — POCT I-STAT, CHEM 8
BUN: 8 mg/dL (ref 6–20)
BUN: 6 mg/dL (ref 6–20)
CREATININE: 0.8 mg/dL (ref 0.44–1.00)
CREATININE: 0.8 mg/dL (ref 0.44–1.00)
Calcium, Ion: 1.01 mmol/L — ABNORMAL LOW (ref 1.15–1.40)
Calcium, Ion: 1.09 mmol/L — ABNORMAL LOW (ref 1.15–1.40)
Chloride: 109 mmol/L (ref 98–111)
Chloride: 107 mmol/L (ref 98–111)
GLUCOSE: 103 mg/dL — AB (ref 70–99)
GLUCOSE: 96 mg/dL (ref 70–99)
HCT: 40 % (ref 36.0–46.0)
HEMATOCRIT: 43 % (ref 36.0–46.0)
Hemoglobin: 14.6 g/dL (ref 12.0–15.0)
Hemoglobin: 13.6 g/dL (ref 12.0–15.0)
POTASSIUM: 5.9 mmol/L — AB (ref 3.5–5.1)
POTASSIUM: 3.3 mmol/L — AB (ref 3.5–5.1)
Sodium: 139 mmol/L (ref 135–145)
Sodium: 142 mmol/L (ref 135–145)
TCO2: 25 mmol/L (ref 22–32)
TCO2: 24 mmol/L (ref 22–32)

## 2018-09-27 SURGERY — LOWER EXTREMITY ANGIOGRAPHY
Anesthesia: LOCAL

## 2018-09-27 MED ORDER — HEPARIN (PORCINE) IN NACL 1000-0.9 UT/500ML-% IV SOLN
INTRAVENOUS | Status: DC | PRN
  Administered 2018-09-27 (×2): 500 mL

## 2018-09-27 MED ORDER — HEPARIN (PORCINE) IN NACL 1000-0.9 UT/500ML-% IV SOLN
INTRAVENOUS | Status: AC
  Filled 2018-09-27: qty 500

## 2018-09-27 MED ORDER — ONDANSETRON HCL 4 MG/2ML IJ SOLN: 4.0000 mg | Freq: Four times a day (QID) | INTRAMUSCULAR | Status: DC | PRN

## 2018-09-27 MED ORDER — SODIUM CHLORIDE 0.9% FLUSH: 3.0000 mL | Freq: Two times a day (BID) | INTRAVENOUS | Status: DC

## 2018-09-27 MED ORDER — ASPIRIN EC 325 MG PO TBEC: 325.0000 mg | DELAYED_RELEASE_TABLET | Freq: Every day | ORAL | 3 refills | Status: AC

## 2018-09-27 MED ORDER — SODIUM CHLORIDE 0.9 % IV SOLN: INTRAVENOUS | Status: AC

## 2018-09-27 MED ORDER — SODIUM CHLORIDE 0.9 % IV SOLN
INTRAVENOUS | Status: DC
  Administered 2018-09-27: 09:00:00 via INTRAVENOUS

## 2018-09-27 MED ORDER — MORPHINE SULFATE (PF) 10 MG/ML IV SOLN: 2.0000 mg | INTRAVENOUS | Status: DC | PRN

## 2018-09-27 MED ORDER — IODIXANOL 320 MG/ML IV SOLN
INTRAVENOUS | Status: DC | PRN
  Administered 2018-09-27: 97 mL via INTRA_ARTERIAL

## 2018-09-27 MED ORDER — LIDOCAINE HCL (PF) 1 % IJ SOLN
INTRAMUSCULAR | Status: DC | PRN
  Administered 2018-09-27: 15 mL via INTRADERMAL

## 2018-09-27 MED ORDER — HYDRALAZINE HCL 20 MG/ML IJ SOLN: 5.0000 mg | INTRAMUSCULAR | Status: DC | PRN

## 2018-09-27 MED ORDER — ACETAMINOPHEN 325 MG PO TABS: 650.0000 mg | ORAL_TABLET | ORAL | Status: DC | PRN

## 2018-09-27 MED ORDER — SODIUM CHLORIDE 0.9 % IV SOLN: 250.0000 mL | INTRAVENOUS | Status: DC | PRN

## 2018-09-27 MED ORDER — LIDOCAINE HCL (PF) 1 % IJ SOLN
INTRAMUSCULAR | Status: AC
  Filled 2018-09-27: qty 30

## 2018-09-27 MED ORDER — OXYCODONE HCL 5 MG PO TABS: 5.0000 mg | ORAL_TABLET | ORAL | Status: DC | PRN

## 2018-09-27 MED ORDER — SODIUM CHLORIDE 0.9% FLUSH: 3.0000 mL | INTRAVENOUS | Status: DC | PRN

## 2018-09-27 MED ORDER — LABETALOL HCL 5 MG/ML IV SOLN: 10.0000 mg | INTRAVENOUS | Status: DC | PRN

## 2018-09-27 SURGICAL SUPPLY — 8 items
CATH ANGIO 5F PIGTAIL 65CM (CATHETERS) ×1 IMPLANT
KIT PV (KITS) ×2 IMPLANT
SHEATH PINNACLE 5F 10CM (SHEATH) ×1 IMPLANT
SHEATH PROBE COVER 6X72 (BAG) ×1 IMPLANT
SYR MEDRAD MARK V 150ML (SYRINGE) ×2 IMPLANT
TRANSDUCER W/STOPCOCK (MISCELLANEOUS) ×2 IMPLANT
TRAY PV CATH (CUSTOM PROCEDURE TRAY) ×2 IMPLANT
WIRE HITORQ VERSACORE ST 145CM (WIRE) ×1 IMPLANT

## 2018-09-27 NOTE — Progress Notes (Signed)
Site area: rt groin fa sheath Site Prior to Removal:  Level 0 Pressure Applied For: 20 minutes Manual:   yes Patient Status During Pull:  stable Post Pull Site:  Level 0 Post Pull Instructions Given:  yes Post Pull Pulses Present: rt dp dopplered Dressing Applied:  Gauze and tegaderm Bedrest begins @ 1330 Comments:

## 2018-09-27 NOTE — Discharge Instructions (Signed)

## 2018-09-27 NOTE — Interval H&P Note (Signed)
History and Physical Interval Note:  09/27/2018 12:01 PM  Virginia Washington  has presented today for surgery, with the diagnosis of pvd  The various methods of treatment have been discussed with the patient and family. After consideration of risks, benefits and other options for treatment, the patient has consented to  Procedure(s): LOWER EXTREMITY ANGIOGRAPHY (N/A) as a surgical intervention .  The patient's history has been reviewed, patient examined, no change in status, stable for surgery.  I have reviewed the patient's chart and labs.  Questions were answered to the patient's satisfaction.     [name]

## 2018-09-27 NOTE — Op Note (Addendum)
Procedure: Abdominal aortogram with bilateral lower extremity runoff  Preoperative diagnosis: Claudication left leg  Postoperative diagnosis: Same  Anesthesia: Local  Operative findings: Flush occlusion left common iliac artery with reconstitution of the common femoral artery via IMA and aortic collaterals intact three-vessel runoff both feet  Operative details: After obtaining informed consent, the patient.  The patient placed in supine position Angio table.  Both groins were prepped and draped in usual sterile fashion.  Local anesthesia was infiltrated of the right common femoral artery.  Ultrasound was used to identify the right common femoral artery.  This was patent.  The femoral bifurcation was identified.  A hardcopy image was obtained to place in the patient's permanent medical record.  Using ultrasound guidance an introducer needle was used to cannulate the right common femoral artery in 035 her score 300 and the abdominal aorta under fluoroscopic guidance.  Next a 5 French sheath was placed over the guidewire and the right common femoral artery.  This was thoroughly flushed with heparinized saline.  A 5 French PEG tube catheter was then advanced over the guidewire up in the abdominal aorta.  Abdominal aortogram was then obtained in AP projection.  The left and right renal arteries are patent.  The infrarenal abdominal aorta is patent.  The right common external and internal iliac arteries are patent.  The left common iliac artery has a flush occlusion at its origin.  The distal external iliac artery then reconstitutes via IMA and aortic collaterals.  The left common femoral artery is patent.  At this point the fetal catheter was pulled dentist above the aortic bifurcation and bilateral lower extremity runoff views were obtained through the pigtail catheter.  In the right lower extremity, the right common femoral profunda femoris superficial femoral popliteal anterior tibial posterior tibial  and peroneal arteries are all widely patent.  In the left lower extremity, the left common femoral profunda femoris superficial femoral popliteal anterior tibial posterior tibial and peroneal arteries are all widely patent.  At this point a big toe catheter was removed over guidewire.  The 5 French sheath was left in place to call in the holding area.  The patient tolerated procedure well there were no complications  Operative management: The patient will be scheduled for a right to left femoral-femoral bypass on October 14, 2018.  [name], MD [facility] Office: [phone] Pager: [phone]

## 2018-09-30 ENCOUNTER — Encounter (HOSPITAL_COMMUNITY): Payer: Self-pay | Admitting: Vascular Surgery

## 2018-10-04 ENCOUNTER — Encounter (HOSPITAL_COMMUNITY): Payer: Medicare HMO

## 2018-10-04 ENCOUNTER — Encounter (HOSPITAL_COMMUNITY)
Admission: RE | Admit: 2018-10-04 | Discharge: 2018-10-04 | Disposition: A | Discharge status: Home / Self Care | Payer: Medicare HMO | Source: Ambulatory Visit | Attending: Cardiology | Admitting: Cardiology

## 2018-10-04 ENCOUNTER — Encounter (HOSPITAL_COMMUNITY): Payer: Self-pay

## 2018-10-04 DIAGNOSIS — R072 Precordial pain: Secondary | ICD-10-CM

## 2018-10-04 DIAGNOSIS — Z01810 Encounter for preprocedural cardiovascular examination: Secondary | ICD-10-CM | POA: Diagnosis not present

## 2018-10-04 LAB — NM MYOCAR MULTI W/SPECT W/WALL MOTION / EF
CHL CUP NUCLEAR SRS: 0
CHL CUP NUCLEAR SSS: 0
CSEPPHR: 108 {beats}/min
LV dias vol: 73 mL (ref 46–106)
LV sys vol: 27 mL
NUC STRESS TID: 1.08
RATE: 0.38
Rest HR: 58 {beats}/min
SDS: 0

## 2018-10-04 MED ORDER — REGADENOSON 0.4 MG/5ML IV SOLN
INTRAVENOUS | Status: AC
  Administered 2018-10-04: 0.4 mg via INTRAVENOUS
  Filled 2018-10-04: qty 5

## 2018-10-04 MED ORDER — TECHNETIUM TC 99M TETROFOSMIN IV KIT
30.0000 | PACK | Freq: Once | INTRAVENOUS | Status: AC | PRN
  Administered 2018-10-04: 31 via INTRAVENOUS

## 2018-10-04 MED ORDER — SODIUM CHLORIDE 0.9% FLUSH
INTRAVENOUS | Status: AC
  Administered 2018-10-04: 10 mL via INTRAVENOUS
  Filled 2018-10-04: qty 10

## 2018-10-04 MED ORDER — TECHNETIUM TC 99M TETROFOSMIN IV KIT
10.0000 | PACK | Freq: Once | INTRAVENOUS | Status: AC | PRN
  Administered 2018-10-04: 11 via INTRAVENOUS

## 2018-10-07 NOTE — Pre-Procedure Instructions (Signed)
SRAH AKE  10/07/2018      [id number] - [place], [address] [place] [address] Phone: [phone] Fax: [phone]    Your procedure is scheduled on Monday October 28th.  Report to [facility] Admitting at        A.M.  Call this number if you have problems the morning of surgery:  [phone]   Remember:  Do not eat or drink after midnight.    Take these medicines the morning of surgery with A SIP OF WATER   Norvasc  Atorvastatin  7 days prior to surgery STOP taking any Aspirin(unless otherwise instructed by your surgeon), Aleve, Naproxen, Ibuprofen, Motrin, Advil, Goody's, BC's, all herbal medications, fish oil, and all vitamins     Do not wear jewelry, make-up or nail polish.  Do not wear lotions, powders, or perfumes, or deodorant.  Do not shave 48 hours prior to surgery.  Men may shave face and neck.  Do not bring valuables to the hospital.  [facility] is not responsible for any belongings or valuables.  Contacts, dentures or bridgework may not be worn into surgery.  Leave your suitcase in the car.  After surgery it may be brought to your room.  For patients admitted to the hospital, discharge time will be determined by your treatment team.  Patients discharged the day of surgery will not be allowed to drive home.    [facility]- Preparing For Surgery  Before surgery, you can play an important role. Because skin is not sterile, your skin needs to be as free of germs as possible. You can reduce the number of germs on your skin by washing with CHG (chlorahexidine gluconate) Soap before surgery.  CHG is an antiseptic cleaner which kills germs and bonds with the skin to continue killing germs even after washing.    Oral Hygiene is also important to reduce your risk of infection.  Remember - BRUSH YOUR TEETH THE MORNING OF SURGERY WITH YOUR REGULAR  TOOTHPASTE  Please do not use if you have an allergy to CHG or antibacterial soaps. If your skin becomes reddened/irritated stop using the CHG.  Do not shave (including legs and underarms) for at least 48 hours prior to first CHG shower. It is OK to shave your face.  Please follow these instructions carefully.   1. Shower the NIGHT BEFORE SURGERY and the MORNING OF SURGERY with CHG.   2. If you chose to wash your hair, wash your hair first as usual with your normal shampoo.  3. After you shampoo, rinse your hair and body thoroughly to remove the shampoo.  4. Use CHG as you would any other liquid soap. You can apply CHG directly to the skin and wash gently with a scrungie or a clean washcloth.   5. Apply the CHG Soap to your body ONLY FROM THE NECK DOWN.  Do not use on open wounds or open sores. Avoid contact with your eyes, ears, mouth and genitals (private parts). Wash Face and genitals (private parts)  with your normal soap.  6. Wash thoroughly, paying special attention to the area where your surgery will be performed.  7. Thoroughly rinse your body with warm water from the neck down.  8. DO NOT shower/wash with your normal soap after using and rinsing off the CHG Soap.  9. Pat yourself dry with a CLEAN  TOWEL.  10. Wear CLEAN PAJAMAS to bed the night before surgery, wear comfortable clothes the morning of surgery  11. Place CLEAN SHEETS on your bed the night of your first shower and DO NOT SLEEP WITH PETS.    Day of Surgery:  Do not apply any deodorants/lotions.  Please wear clean clothes to the hospital/surgery center.   Remember to brush your teeth WITH YOUR REGULAR TOOTHPASTE.    Please read over the following fact sheets that you were given.

## 2018-10-08 ENCOUNTER — Encounter (HOSPITAL_COMMUNITY)
Admission: RE | Admit: 2018-10-08 | Discharge: 2018-10-08 | Disposition: A | Discharge status: Home / Self Care | Payer: Medicare HMO | Source: Ambulatory Visit | Attending: Vascular Surgery | Admitting: Vascular Surgery

## 2018-10-09 ENCOUNTER — Telehealth: Payer: Self-pay | Admitting: *Deleted

## 2018-10-09 NOTE — Telephone Encounter (Signed)
Call to patient and instructed to be at [facility] admitting at 9 am on 10/21/18 for surgery. NPO past MN  Night prior. Expect a call and follow the detailed instructions received from the pre-admitting department for this surgery, meds am of surgery.  Instructed to continue aspirin. Verbalized understanding and to call this office if questions.

## 2018-10-18 ENCOUNTER — Encounter (HOSPITAL_COMMUNITY): Payer: Self-pay

## 2018-10-18 ENCOUNTER — Other Ambulatory Visit: Payer: Self-pay

## 2018-10-18 ENCOUNTER — Encounter (HOSPITAL_COMMUNITY)
Admission: RE | Admit: 2018-10-18 | Discharge: 2018-10-18 | Disposition: A | Discharge status: Home / Self Care | Payer: Medicare HMO | Source: Ambulatory Visit | Attending: Vascular Surgery | Admitting: Vascular Surgery

## 2018-10-18 DIAGNOSIS — I739 Peripheral vascular disease, unspecified: Secondary | ICD-10-CM | POA: Insufficient documentation

## 2018-10-18 DIAGNOSIS — E785 Hyperlipidemia, unspecified: Secondary | ICD-10-CM | POA: Insufficient documentation

## 2018-10-18 DIAGNOSIS — J449 Chronic obstructive pulmonary disease, unspecified: Secondary | ICD-10-CM

## 2018-10-18 DIAGNOSIS — I10 Essential (primary) hypertension: Secondary | ICD-10-CM | POA: Insufficient documentation

## 2018-10-18 DIAGNOSIS — Z86718 Personal history of other venous thrombosis and embolism: Secondary | ICD-10-CM

## 2018-10-18 DIAGNOSIS — Z79899 Other long term (current) drug therapy: Secondary | ICD-10-CM | POA: Insufficient documentation

## 2018-10-18 DIAGNOSIS — Z7982 Long term (current) use of aspirin: Secondary | ICD-10-CM | POA: Insufficient documentation

## 2018-10-18 DIAGNOSIS — Z01812 Encounter for preprocedural laboratory examination: Secondary | ICD-10-CM | POA: Insufficient documentation

## 2018-10-18 DIAGNOSIS — Z87891 Personal history of nicotine dependence: Secondary | ICD-10-CM

## 2018-10-18 HISTORY — DX: Personal history of urinary calculi: Z87.442

## 2018-10-18 LAB — URINALYSIS, ROUTINE W REFLEX MICROSCOPIC
Bilirubin Urine: NEGATIVE
Glucose, UA: NEGATIVE mg/dL
Hgb urine dipstick: NEGATIVE
KETONES UR: NEGATIVE mg/dL
LEUKOCYTES UA: NEGATIVE
NITRITE: NEGATIVE
PH: 7 (ref 5.0–8.0)
PROTEIN: NEGATIVE mg/dL
Specific Gravity, Urine: 1.006 (ref 1.005–1.030)

## 2018-10-18 LAB — COMPREHENSIVE METABOLIC PANEL
ALK PHOS: 112 U/L (ref 38–126)
ALT: 24 U/L (ref 0–44)
AST: 23 U/L (ref 15–41)
Albumin: 4 g/dL (ref 3.5–5.0)
Anion gap: 7 (ref 5–15)
BILIRUBIN TOTAL: 0.5 mg/dL (ref 0.3–1.2)
BUN: 6 mg/dL (ref 6–20)
CALCIUM: 9.2 mg/dL (ref 8.9–10.3)
CHLORIDE: 107 mmol/L (ref 98–111)
CO2: 26 mmol/L (ref 22–32)
CREATININE: 0.88 mg/dL (ref 0.44–1.00)
Glucose, Bld: 87 mg/dL (ref 70–99)
Potassium: 3.2 mmol/L — ABNORMAL LOW (ref 3.5–5.1)
Sodium: 140 mmol/L (ref 135–145)
Total Protein: 7.1 g/dL (ref 6.5–8.1)

## 2018-10-18 LAB — CBC
HEMATOCRIT: 48.5 % — AB (ref 36.0–46.0)
HEMOGLOBIN: 14.1 g/dL (ref 12.0–15.0)
MCH: 26.1 pg (ref 26.0–34.0)
MCHC: 29.1 g/dL — ABNORMAL LOW (ref 30.0–36.0)
MCV: 89.6 fL (ref 80.0–100.0)
NRBC: 0 % (ref 0.0–0.2)
PLATELETS: 267 10*3/uL (ref 150–400)
RBC: 5.41 MIL/uL — AB (ref 3.87–5.11)
RDW: 14.3 % (ref 11.5–15.5)
WBC: 7.3 10*3/uL (ref 4.0–10.5)

## 2018-10-18 LAB — TYPE AND SCREEN
ABO/RH(D): O POS
Antibody Screen: NEGATIVE

## 2018-10-18 LAB — PROTIME-INR
INR: 1.17
PROTHROMBIN TIME: 14.8 s (ref 11.4–15.2)

## 2018-10-18 LAB — ABO/RH: ABO/RH(D): O POS

## 2018-10-18 LAB — SURGICAL PCR SCREEN
MRSA, PCR: NEGATIVE
STAPHYLOCOCCUS AUREUS: NEGATIVE

## 2018-10-18 LAB — APTT: aPTT: 37 seconds — ABNORMAL HIGH (ref 24–36)

## 2018-10-18 NOTE — Pre-Procedure Instructions (Signed)
Virginia Washington  10/18/2018       Your procedure is scheduled on Monday, November 4.  Report to [facility] Admitting at   9:15  A.M.               Your surgery or procedure is scheduled for 11:15 AM   Call this number if you have problems the morning of surgery: [phone]  This is the number for the Pre- Surgical Desk.    Remember:  Do not eat or drink after midnight Sunday, November 3.    Take these medicines the morning of surgery with A SIP OF WATER   Amlodipine (Norvasc)  Atorvastatin (Lipitor)  7 days prior to surgery STOP taking any Aspirin(unless otherwise instructed by your surgeon), Aleve, Naproxen, Ibuprofen, Motrin, Advil, Goody's, BC's, all herbal medications, fish oil, and all vitamins    [facility]- Preparing For Surgery  Before surgery, you can play an important role. Because skin is not sterile, your skin needs to be as free of germs as possible. You can reduce the number of germs on your skin by washing with CHG (chlorahexidine gluconate) Soap before surgery.  CHG is an antiseptic cleaner which kills germs and bonds with the skin to continue killing germs even after washing.    Oral Hygiene is also important to reduce your risk of infection.  Remember - BRUSH YOUR TEETH THE MORNING OF SURGERY WITH YOUR REGULAR TOOTHPASTE  Please do not use if you have an allergy to CHG or antibacterial soaps. If your skin becomes reddened/irritated stop using the CHG.  Do not shave (including legs and underarms) for at least 48 hours prior to first CHG shower. It is OK to shave your face.  Please follow these instructions carefully.   1. Shower the NIGHT BEFORE SURGERY and the MORNING OF SURGERY with CHG.   2. If you chose to wash your hair, wash your hair first as usual with your normal shampoo.  3. After you shampoouse , wash your face and private area with the soap you use  at home, then rinse your hair and body thoroughly to remove the shampoo and  shampoo.  4. Use CHG as you would any other liquid soap. You can apply CHG directly to the skin and wash gently with a scrungie or a clean washcloth.   5. Apply the CHG Soap to your body ONLY FROM THE NECK DOWN.  Do not use on open wounds or open sores. Avoid contact with your eyes, ears, mouth and genitals (private parts).  6. Wash thoroughly, paying special attention to the area where your surgery will be performed.  7. Thoroughly rinse your body with warm water from the neck down.  8. DO NOT shower/wash with your normal soap after using and rinsing off the CHG Soap.  9. Pat yourself dry with a CLEAN TOWEL.  10. Wear CLEAN PAJAMAS to bed the night before surgery, wear comfortable clothes the morning of surgery  11. Place CLEAN SHEETS on your bed the night of your first shower and DO NOT SLEEP WITH PETS.  Day of Surgery: Shower as above   Do not apply any deodorants/lotions, powders or colognes..  Please wear clean clothes to the hospital/surgery center.   Remember to brush your teeth WITH YOUR REGULAR TOOTHPASTE. Do not wear jewelry, make-up or nail polish.  Do not shave 48 hours prior to surgery.  .  Do not bring valuables to the hospital.  [facility] is  not responsible for any belongings or valuables.  Contacts, dentures or bridgework may not be worn into surgery.  Leave your suitcase in the car.  After surgery it may be brought to your room.  For patients admitted to the hospital, discharge time will be determined by your treatment team.  Patients discharged the day of surgery will not be allowed to drive home.   Please read over the following fact sheets that you were given.

## 2018-10-18 NOTE — Progress Notes (Signed)
Virginia Washington reports that she does not have chest pain or shortness of breath. I read to patient part of notes from her cardiologist, that says she has had some chest pain.  "I'm not having any now and he had me checked out."  I see in notes that a stress test was ordered, patient reports that she had a stress test.  I do not find the results of the stress test.  Patient said that she received a voice message from Dr Allison Quarry office as she left to come her that she has an appointment at his office on Thursday, November 7, "I can't go to his office if I'm in the hospital."  I reviewed patient station and saw that patient does have a appoint next week with Dr Ellyn Hack, to follow up stress test. I called  Dr Allison Quarry office and asked if stress test had been done, the nurse could not find the results at first, but she did find that results had been sent to Dr Oneida Alar office.  I found the stress test results under media. Nurse said that she will cancel appointment for next week, I informed patient. I asked anesthesiology PA to review.

## 2018-10-18 NOTE — Anesthesia Preprocedure Evaluation (Addendum)
Anesthesia Evaluation  Patient identified by MRN, date of birth, ID band Patient awake    Reviewed: Allergy & Precautions, H&P , NPO status , Patient's Chart, lab work & pertinent test results  Airway Mallampati: I  TM Distance: >3 FB Neck ROM: Full    Dental no notable dental hx. (+) Edentulous Upper, Edentulous Lower, Dental Advisory Given   Pulmonary Current Smoker,    Pulmonary exam normal breath sounds clear to auscultation       Cardiovascular hypertension, Pt. on medications + Peripheral Vascular Disease  negative cardio ROS   Rhythm:Regular Rate:Normal     Neuro/Psych  Headaches, negative psych ROS   GI/Hepatic Neg liver ROS, GERD  Controlled,  Endo/Other  negative endocrine ROS  Renal/GU negative Renal ROS  negative genitourinary   Musculoskeletal   Abdominal   Peds  Hematology negative hematology ROS (+)   Anesthesia Other Findings   Reproductive/Obstetrics negative OB ROS                           Anesthesia Physical Anesthesia Plan  ASA: III  Anesthesia Plan: General   Post-op Pain Management:    Induction: Intravenous  PONV Risk Score and Plan: 3 and Ondansetron, Dexamethasone and Midazolam  Airway Management Planned: Oral ETT  Additional Equipment:   Intra-op Plan:   Post-operative Plan: Extubation in OR  Informed Consent: I have reviewed the patients History and Physical, chart, labs and discussed the procedure including the risks, benefits and alternatives for the proposed anesthesia with the patient or authorized representative who has indicated his/her understanding and acceptance.   Dental advisory given  Plan Discussed with: CRNA  Anesthesia Plan Comments: (PAT note written 10/18/2018 by Myra Gianotti, PA-C. )       Anesthesia Quick Evaluation

## 2018-10-18 NOTE — Progress Notes (Signed)
Anesthesia Chart Review:  Case:  [id number] Date/Time:  10/21/18 1100   Procedure:  RIGHT TO LEFT FEMORAL ARTERY BYPASS GRAFT (Bilateral )   Anesthesia type:  General   Pre-op diagnosis:  PERIPHERAL VASCULAR DISEASE   Location:  [facility] OR ROOM 65 / [facility] OR   Surgeon:  Elam Dutch, MD      DISCUSSION: Patient is a 62 year old female scheduled for the above procedure.   History includes smoking, COPD, HTN, HLD, GERD, DVT, PAD (claudication with left iliac occlusion),  - Admitted 04/27/18-04/30/18 with partial small bowel obstruction and pneumonia.  Preoperative stress test per Dr. Ellyn Hack was normal. If no acute changes then I anticipate that she can proceed as planned.   VS: BP (!) 146/74   Pulse 71   Temp 36.7 C   Resp 18   Ht 5' (1.524 m)   Wt 63 kg   SpO2 100%   BMI 27.13 kg/m     PROVIDERS: [name], MD is PCP Glenetta Hew, MD is cardiologist. Last visit 09/23/18. She reported some intermittent chest discomfort that did not sound cardiac in nature, but given upcoming vascular surgery and inability to really assess exertional symptoms a Lexiscan Myoview was ordered which was low risk.   LABS: Labs reviewed: Acceptable for surgery. (all labs ordered are listed, but only abnormal results are displayed)  Labs Reviewed  APTT - Abnormal; Notable for the following components:      Result Value   aPTT 37 (*)    All other components within normal limits  CBC - Abnormal; Notable for the following components:   RBC 5.41 (*)    HCT 48.5 (*)    MCHC 29.1 (*)    All other components within normal limits  COMPREHENSIVE METABOLIC PANEL - Abnormal; Notable for the following components:   Potassium 3.2 (*)    All other components within normal limits  SURGICAL PCR SCREEN  PROTIME-INR  URINALYSIS, ROUTINE W REFLEX MICROSCOPIC  TYPE AND SCREEN  ABO/RH    IMAGES: CXR 04/28/18 (during admission for partial SBO and PNA): IMPRESSION: LOWER lobe opacity on the LATERAL view  which may represent atelectasis or airspace disease/pneumonia. Radiographic follow-up to resolution is recommended.   EKG: 09/23/18: SB at 57 bpm.   CV: Nuclear stress test 10/04/18:  There was no ST segment deviation noted during stress.  The study is normal. There are no perfusion defects  This is a low risk study.  The left ventricular ejection fraction is normal (55-65%).  PV a-gram 09/27/18: Operative findings: Flush occlusion left common iliac artery with reconstitution of the common femoral artery via IMA and aortic collaterals intact three-vessel runoff both feet Operative management: The patient will be scheduled for a right to left femoral-femoral bypass  Echo 01/10/12: Study Conclusions - Left ventricle: The cavity size was normal. Wall thickness was increased in a pattern of mild LVH. Systolic function was normal. The estimated ejection fraction was in the range of 55% to 60%. Wall motion was normal; there were no regional wall motion abnormalities. Features are consistent with a pseudonormal left ventricular filling pattern, with concomitant abnormal relaxation and increased filling pressure (grade 2 diastolic dysfunction). - Mitral valve: Mildly thickened leaflets . Trivial regurgitation. - Tricuspid valve: Trivial regurgitation. - Pericardium, extracardiac: There was no pericardial effusion.   Past Medical History:  Diagnosis Date  . Chronic back pain   . Constipation   . Constipation   . COPD (chronic obstructive pulmonary disease) ([place])   .  DVT (deep venous thrombosis) ([place])   . GERD (gastroesophageal reflux disease)   . Headache   . History of kidney stones   . Hyperlipidemia   . Hypertension     Past Surgical History:  Procedure Laterality Date  . 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  Normal right aortoiliac vessels.  Three-vessel runoff bilaterally.  (Sluggish flow of the left side due to occlusion.  . ABDOMINAL HYSTERECTOMY    . CHOLECYSTECTOMY    . COLONOSCOPY  2007   Dr. Laural Golden: small internal hemorrhoids, otherwise normal. exam could be compromised due to quality of prep  . COLONOSCOPY N/A 02/15/2018   Dr. Oneida Alar: 2 simple adenomas, external and internal hemorrhoids. Next colonoscopy 2024-2026  . ESOPHAGOGASTRODUODENOSCOPY N/A 05/09/2018   Procedure: ESOPHAGOGASTRODUODENOSCOPY (EGD);  Surgeon: Danie Binder, MD;  [facility];  Service: Endoscopy;  Laterality: N/A;  2:15pm- Possible Pyloric Dilation  . LOWER EXTREMITY ANGIOGRAPHY N/A 09/27/2018   Procedure: LOWER EXTREMITY ANGIOGRAPHY;  Surgeon: Elam Dutch, MD;  Location: [facility];  Service: Cardiovascular;  Laterality: N/A;  . NM MYOVIEW LTD  12/2014   [facility]: Normal Myoview.  Nonischemic.  Marland Kitchen TRANSTHORACIC ECHOCARDIOGRAM  12/2011   EF 55 to 60%.  No regional wall motion normality.  GRII DD.  No significant valve disease.    MEDICATIONS: . amLODipine (NORVASC) 5 MG tablet  . aspirin EC 325 MG tablet  . atorvastatin (LIPITOR) 20 MG tablet  . linaclotide (LINZESS) 290 MCG CAPS capsule   No current facility-administered medications for this encounter.    She is to continue ASA.   George Hugh [facility] Short Stay Center/Anesthesiology Phone [phone] 10/18/2018 1:55 PM

## 2018-10-21 ENCOUNTER — Inpatient Hospital Stay (HOSPITAL_COMMUNITY): Payer: Medicare HMO | Admitting: Vascular Surgery

## 2018-10-21 ENCOUNTER — Inpatient Hospital Stay (HOSPITAL_COMMUNITY)
Admission: RE | Admit: 2018-10-21 | Discharge: 2018-10-23 | DRG: 254 | Disposition: A | Discharge status: Home / Self Care | Payer: Medicare HMO | Attending: Vascular Surgery | Admitting: Vascular Surgery

## 2018-10-21 ENCOUNTER — Encounter (HOSPITAL_COMMUNITY): Payer: Self-pay | Admitting: Certified Registered Nurse Anesthetist

## 2018-10-21 ENCOUNTER — Other Ambulatory Visit: Payer: Self-pay

## 2018-10-21 ENCOUNTER — Encounter (HOSPITAL_COMMUNITY)
Admission: RE | Disposition: A | Discharge status: Home / Self Care | Payer: Self-pay | Source: Home / Self Care | Attending: Vascular Surgery

## 2018-10-21 DIAGNOSIS — Z9889 Other specified postprocedural states: Secondary | ICD-10-CM | POA: Diagnosis not present

## 2018-10-21 DIAGNOSIS — F17210 Nicotine dependence, cigarettes, uncomplicated: Secondary | ICD-10-CM | POA: Diagnosis present

## 2018-10-21 DIAGNOSIS — Z86718 Personal history of other venous thrombosis and embolism: Secondary | ICD-10-CM

## 2018-10-21 DIAGNOSIS — J449 Chronic obstructive pulmonary disease, unspecified: Secondary | ICD-10-CM | POA: Diagnosis present

## 2018-10-21 DIAGNOSIS — E785 Hyperlipidemia, unspecified: Secondary | ICD-10-CM | POA: Diagnosis present

## 2018-10-21 DIAGNOSIS — Z23 Encounter for immunization: Secondary | ICD-10-CM

## 2018-10-21 DIAGNOSIS — G8929 Other chronic pain: Secondary | ICD-10-CM | POA: Diagnosis present

## 2018-10-21 DIAGNOSIS — I10 Essential (primary) hypertension: Secondary | ICD-10-CM | POA: Diagnosis present

## 2018-10-21 DIAGNOSIS — K219 Gastro-esophageal reflux disease without esophagitis: Secondary | ICD-10-CM | POA: Diagnosis present

## 2018-10-21 DIAGNOSIS — R69 Illness, unspecified: Secondary | ICD-10-CM | POA: Diagnosis not present

## 2018-10-21 DIAGNOSIS — I70212 Atherosclerosis of native arteries of extremities with intermittent claudication, left leg: Secondary | ICD-10-CM | POA: Diagnosis not present

## 2018-10-21 DIAGNOSIS — Z79899 Other long term (current) drug therapy: Secondary | ICD-10-CM | POA: Diagnosis not present

## 2018-10-21 DIAGNOSIS — E876 Hypokalemia: Secondary | ICD-10-CM | POA: Diagnosis not present

## 2018-10-21 DIAGNOSIS — I739 Peripheral vascular disease, unspecified: Secondary | ICD-10-CM | POA: Diagnosis not present

## 2018-10-21 HISTORY — PX: FEMORAL-FEMORAL BYPASS GRAFT: SHX936

## 2018-10-21 LAB — CBC
HEMATOCRIT: 43.5 % (ref 36.0–46.0)
HEMOGLOBIN: 13.2 g/dL (ref 12.0–15.0)
MCH: 26.9 pg (ref 26.0–34.0)
MCHC: 30.3 g/dL (ref 30.0–36.0)
MCV: 88.8 fL (ref 80.0–100.0)
Platelets: 219 10*3/uL (ref 150–400)
RBC: 4.9 MIL/uL (ref 3.87–5.11)
RDW: 14.5 % (ref 11.5–15.5)
WBC: 9.5 10*3/uL (ref 4.0–10.5)
nRBC: 0 % (ref 0.0–0.2)

## 2018-10-21 LAB — CREATININE, SERUM
CREATININE: 1 mg/dL (ref 0.44–1.00)
GFR calc Af Amer: >60 mL/min (ref >60)
GFR calc non Af Amer: 60 mL/min — ABNORMAL LOW (ref >60)

## 2018-10-21 SURGERY — CREATION, BYPASS, ARTERIAL, FEMORAL TO FEMORAL, USING GRAFT
Anesthesia: General | Laterality: Bilateral

## 2018-10-21 MED ORDER — ONDANSETRON HCL 4 MG/2ML IJ SOLN
INTRAMUSCULAR | Status: DC | PRN
  Administered 2018-10-21: 4 mg via INTRAVENOUS

## 2018-10-21 MED ORDER — LABETALOL HCL 5 MG/ML IV SOLN: 10.0000 mg | INTRAVENOUS | Status: DC | PRN

## 2018-10-21 MED ORDER — 0.9 % SODIUM CHLORIDE (POUR BTL) OPTIME
TOPICAL | Status: DC | PRN
  Administered 2018-10-21: 1000 mL

## 2018-10-21 MED ORDER — FENTANYL CITRATE (PF) 100 MCG/2ML IJ SOLN
INTRAMUSCULAR | Status: DC | PRN
  Administered 2018-10-21 (×5): 50 ug via INTRAVENOUS

## 2018-10-21 MED ORDER — LINACLOTIDE 145 MCG PO CAPS
290.0000 ug | ORAL_CAPSULE | Freq: Every day | ORAL | Status: DC
  Administered 2018-10-21 – 2018-10-22 (×2): 290 ug via ORAL
  Filled 2018-10-21 (×2): qty 2

## 2018-10-21 MED ORDER — SODIUM CHLORIDE 0.9 % IV SOLN
INTRAVENOUS | Status: DC
  Administered 2018-10-21: 10:00:00 via INTRAVENOUS

## 2018-10-21 MED ORDER — HYDROMORPHONE HCL 1 MG/ML IJ SOLN
INTRAMUSCULAR | Status: AC
  Filled 2018-10-21: qty 1

## 2018-10-21 MED ORDER — EPHEDRINE SULFATE 50 MG/ML IJ SOLN
INTRAMUSCULAR | Status: DC | PRN
  Administered 2018-10-21: 5 mg via INTRAVENOUS
  Administered 2018-10-21: 10 mg via INTRAVENOUS

## 2018-10-21 MED ORDER — GUAIFENESIN-DM 100-10 MG/5ML PO SYRP: 15.0000 mL | ORAL_SOLUTION | ORAL | Status: DC | PRN

## 2018-10-21 MED ORDER — CEFAZOLIN SODIUM-DEXTROSE 2-4 GM/100ML-% IV SOLN
2.0000 g | INTRAVENOUS | Status: AC
  Administered 2018-10-21: 2 g via INTRAVENOUS

## 2018-10-21 MED ORDER — CHLORHEXIDINE GLUCONATE 4 % EX LIQD: 60.0000 mL | Freq: Once | CUTANEOUS | Status: DC

## 2018-10-21 MED ORDER — CEFAZOLIN SODIUM-DEXTROSE 2-4 GM/100ML-% IV SOLN
2.0000 g | Freq: Three times a day (TID) | INTRAVENOUS | Status: AC
  Administered 2018-10-21 – 2018-10-22 (×2): 2 g via INTRAVENOUS
  Filled 2018-10-21 (×3): qty 100

## 2018-10-21 MED ORDER — PROTAMINE SULFATE 10 MG/ML IV SOLN
INTRAVENOUS | Status: AC
  Filled 2018-10-21: qty 5

## 2018-10-21 MED ORDER — PROPOFOL 10 MG/ML IV BOLUS
INTRAVENOUS | Status: DC | PRN
  Administered 2018-10-21: 100 mg via INTRAVENOUS
  Administered 2018-10-21: 50 mg via INTRAVENOUS

## 2018-10-21 MED ORDER — PROPOFOL 10 MG/ML IV BOLUS
INTRAVENOUS | Status: AC
  Filled 2018-10-21: qty 20

## 2018-10-21 MED ORDER — PHENOL 1.4 % MT LIQD: 1.0000 | OROMUCOSAL | Status: DC | PRN

## 2018-10-21 MED ORDER — SENNOSIDES-DOCUSATE SODIUM 8.6-50 MG PO TABS: 1.0000 | ORAL_TABLET | Freq: Every evening | ORAL | Status: DC | PRN

## 2018-10-21 MED ORDER — EPHEDRINE 5 MG/ML INJ
INTRAVENOUS | Status: AC
  Filled 2018-10-21: qty 10

## 2018-10-21 MED ORDER — MORPHINE SULFATE (PF) 2 MG/ML IV SOLN: 2.0000 mg | INTRAVENOUS | Status: DC | PRN

## 2018-10-21 MED ORDER — HYDRALAZINE HCL 20 MG/ML IJ SOLN: 5.0000 mg | INTRAMUSCULAR | Status: DC | PRN

## 2018-10-21 MED ORDER — MIDAZOLAM HCL 2 MG/2ML IJ SOLN
INTRAMUSCULAR | Status: AC
  Filled 2018-10-21: qty 2

## 2018-10-21 MED ORDER — BISACODYL 5 MG PO TBEC: 5.0000 mg | DELAYED_RELEASE_TABLET | Freq: Every day | ORAL | Status: DC | PRN

## 2018-10-21 MED ORDER — ACETAMINOPHEN 325 MG PO TABS: 325.0000 mg | ORAL_TABLET | ORAL | Status: DC | PRN

## 2018-10-21 MED ORDER — ONDANSETRON HCL 4 MG/2ML IJ SOLN
INTRAMUSCULAR | Status: AC
  Filled 2018-10-21: qty 2

## 2018-10-21 MED ORDER — ROCURONIUM BROMIDE 50 MG/5ML IV SOSY
PREFILLED_SYRINGE | INTRAVENOUS | Status: DC | PRN
  Administered 2018-10-21: 50 mg via INTRAVENOUS

## 2018-10-21 MED ORDER — ALUM & MAG HYDROXIDE-SIMETH 200-200-20 MG/5ML PO SUSP: 15.0000 mL | ORAL | Status: DC | PRN

## 2018-10-21 MED ORDER — ASPIRIN EC 325 MG PO TBEC
325.0000 mg | DELAYED_RELEASE_TABLET | Freq: Every day | ORAL | Status: DC
  Administered 2018-10-21 – 2018-10-23 (×3): 325 mg via ORAL
  Filled 2018-10-21 (×3): qty 1

## 2018-10-21 MED ORDER — DOCUSATE SODIUM 100 MG PO CAPS
100.0000 mg | ORAL_CAPSULE | Freq: Every day | ORAL | Status: DC
  Administered 2018-10-22 – 2018-10-23 (×2): 100 mg via ORAL
  Filled 2018-10-21 (×2): qty 1

## 2018-10-21 MED ORDER — HEPARIN SODIUM (PORCINE) 1000 UNIT/ML IJ SOLN
INTRAMUSCULAR | Status: DC | PRN
  Administered 2018-10-21: 7000 [IU] via INTRAVENOUS

## 2018-10-21 MED ORDER — ACETAMINOPHEN 325 MG RE SUPP: 325.0000 mg | RECTAL | Status: DC | PRN

## 2018-10-21 MED ORDER — SODIUM CHLORIDE 0.9 % IV SOLN
INTRAVENOUS | Status: DC
  Administered 2018-10-21: 21:00:00 via INTRAVENOUS

## 2018-10-21 MED ORDER — SODIUM CHLORIDE 0.9 % IV SOLN
INTRAVENOUS | Status: DC | PRN
  Administered 2018-10-21: 500 mL

## 2018-10-21 MED ORDER — SODIUM CHLORIDE 0.9 % IV SOLN
INTRAVENOUS | Status: DC | PRN
  Administered 2018-10-21: 25 ug/min via INTRAVENOUS

## 2018-10-21 MED ORDER — MIDAZOLAM HCL 5 MG/5ML IJ SOLN
INTRAMUSCULAR | Status: DC | PRN
  Administered 2018-10-21: 2 mg via INTRAVENOUS

## 2018-10-21 MED ORDER — CEFAZOLIN SODIUM-DEXTROSE 2-4 GM/100ML-% IV SOLN
INTRAVENOUS | Status: AC
  Filled 2018-10-21: qty 100

## 2018-10-21 MED ORDER — PANTOPRAZOLE SODIUM 40 MG PO TBEC
40.0000 mg | DELAYED_RELEASE_TABLET | Freq: Every day | ORAL | Status: DC
  Administered 2018-10-21 – 2018-10-23 (×3): 40 mg via ORAL
  Filled 2018-10-21 (×3): qty 1

## 2018-10-21 MED ORDER — ROCURONIUM BROMIDE 50 MG/5ML IV SOSY
PREFILLED_SYRINGE | INTRAVENOUS | Status: AC
  Filled 2018-10-21: qty 5

## 2018-10-21 MED ORDER — LACTATED RINGERS IV SOLN
INTRAVENOUS | Status: DC | PRN
  Administered 2018-10-21: 15:00:00 via INTRAVENOUS

## 2018-10-21 MED ORDER — FENTANYL CITRATE (PF) 250 MCG/5ML IJ SOLN
INTRAMUSCULAR | Status: AC
  Filled 2018-10-21: qty 5

## 2018-10-21 MED ORDER — MAGNESIUM SULFATE 2 GM/50ML IV SOLN: 2.0000 g | Freq: Every day | INTRAVENOUS | Status: DC | PRN

## 2018-10-21 MED ORDER — DEXAMETHASONE SODIUM PHOSPHATE 10 MG/ML IJ SOLN
INTRAMUSCULAR | Status: AC
  Filled 2018-10-21: qty 1

## 2018-10-21 MED ORDER — LIDOCAINE 2% (20 MG/ML) 5 ML SYRINGE
INTRAMUSCULAR | Status: DC | PRN
  Administered 2018-10-21: 60 mg via INTRAVENOUS

## 2018-10-21 MED ORDER — METOPROLOL TARTRATE 5 MG/5ML IV SOLN: 2.0000 mg | INTRAVENOUS | Status: DC | PRN

## 2018-10-21 MED ORDER — POTASSIUM CHLORIDE CRYS ER 20 MEQ PO TBCR: 20.0000 meq | EXTENDED_RELEASE_TABLET | Freq: Every day | ORAL | Status: DC | PRN

## 2018-10-21 MED ORDER — SODIUM CHLORIDE 0.9 % IV SOLN
INTRAVENOUS | Status: AC
  Filled 2018-10-21: qty 1.2

## 2018-10-21 MED ORDER — PROTAMINE SULFATE 10 MG/ML IV SOLN
INTRAVENOUS | Status: DC | PRN
  Administered 2018-10-21: 30 mg via INTRAVENOUS
  Administered 2018-10-21: 20 mg via INTRAVENOUS

## 2018-10-21 MED ORDER — OXYCODONE HCL 5 MG PO TABS
5.0000 mg | ORAL_TABLET | ORAL | Status: DC | PRN
  Administered 2018-10-21 – 2018-10-23 (×6): 10 mg via ORAL
  Filled 2018-10-21 (×6): qty 2

## 2018-10-21 MED ORDER — ATORVASTATIN CALCIUM 20 MG PO TABS
20.0000 mg | ORAL_TABLET | Freq: Every day | ORAL | Status: DC
  Administered 2018-10-21 – 2018-10-23 (×3): 20 mg via ORAL
  Filled 2018-10-21 (×3): qty 1

## 2018-10-21 MED ORDER — ENOXAPARIN SODIUM 30 MG/0.3ML ~~LOC~~ SOLN
30.0000 mg | SUBCUTANEOUS | Status: DC
  Administered 2018-10-22 – 2018-10-23 (×2): 30 mg via SUBCUTANEOUS
  Filled 2018-10-21 (×2): qty 0.3

## 2018-10-21 MED ORDER — SUGAMMADEX SODIUM 200 MG/2ML IV SOLN
INTRAVENOUS | Status: DC | PRN
  Administered 2018-10-21: 122.4 mg via INTRAVENOUS

## 2018-10-21 MED ORDER — HYDROMORPHONE HCL 1 MG/ML IJ SOLN
0.2500 mg | INTRAMUSCULAR | Status: DC | PRN
  Administered 2018-10-21 (×2): 0.5 mg via INTRAVENOUS

## 2018-10-21 MED ORDER — SODIUM CHLORIDE 0.9 % IV SOLN: 500.0000 mL | Freq: Once | INTRAVENOUS | Status: DC | PRN

## 2018-10-21 MED ORDER — LIDOCAINE 2% (20 MG/ML) 5 ML SYRINGE
INTRAMUSCULAR | Status: AC
  Filled 2018-10-21: qty 5

## 2018-10-21 MED ORDER — AMLODIPINE BESYLATE 5 MG PO TABS
5.0000 mg | ORAL_TABLET | Freq: Every day | ORAL | Status: DC
  Administered 2018-10-21 – 2018-10-23 (×3): 5 mg via ORAL
  Filled 2018-10-21 (×3): qty 1

## 2018-10-21 MED ORDER — ONDANSETRON HCL 4 MG/2ML IJ SOLN: 4.0000 mg | Freq: Four times a day (QID) | INTRAMUSCULAR | Status: DC | PRN

## 2018-10-21 MED ORDER — DEXAMETHASONE SODIUM PHOSPHATE 10 MG/ML IJ SOLN
INTRAMUSCULAR | Status: DC | PRN
  Administered 2018-10-21: 5 mg via INTRAVENOUS

## 2018-10-21 SURGICAL SUPPLY — 52 items
ADH SKN CLS APL DERMABOND .7 (GAUZE/BANDAGES/DRESSINGS) ×2
AGENT HMST SPONGE THK3/8 (HEMOSTASIS)
BAG ISL DRAPE 18X18 STRL (DRAPES)
BAG ISOLATION DRAPE 18X18 (DRAPES) ×1 IMPLANT
BLADE CLIPPER SENSICLIP SURGIC (BLADE) ×1 IMPLANT
CANISTER SUCT 3000ML PPV (MISCELLANEOUS) ×2 IMPLANT
CANNULA VESSEL 3MM 2 BLNT TIP (CANNULA) ×4 IMPLANT
CATH FOLEY 2WAY SLVR  5CC 12FR (CATHETERS) ×1
CATH FOLEY 2WAY SLVR  5CC 16FR (CATHETERS) ×1
CATH FOLEY 2WAY SLVR 5CC 12FR (CATHETERS) IMPLANT
CATH FOLEY 2WAY SLVR 5CC 16FR (CATHETERS) IMPLANT
CLIP VESOCCLUDE MED 24/CT (CLIP) ×2 IMPLANT
CLIP VESOCCLUDE SM WIDE 24/CT (CLIP) ×2 IMPLANT
COVER WAND RF STERILE (DRAPES) ×2 IMPLANT
DERMABOND ADVANCED (GAUZE/BANDAGES/DRESSINGS) ×2
DERMABOND ADVANCED .7 DNX12 (GAUZE/BANDAGES/DRESSINGS) IMPLANT
DRAIN SNY WOU (WOUND CARE) IMPLANT
DRAPE ISOLATION BAG 18X18 (DRAPES)
ELECT REM PT RETURN 9FT ADLT (ELECTROSURGICAL) ×2
ELECTRODE REM PT RTRN 9FT ADLT (ELECTROSURGICAL) ×1 IMPLANT
EVACUATOR SILICONE 100CC (DRAIN) IMPLANT
GLOVE BIO SURGEON STRL SZ7.5 (GLOVE) ×2 IMPLANT
GLOVE BIOGEL PI IND STRL 6.5 (GLOVE) IMPLANT
GLOVE BIOGEL PI INDICATOR 6.5 (GLOVE) ×1
GLOVE SURG SS PI 6.5 STRL IVOR (GLOVE) ×1 IMPLANT
GOWN STRL NON-REIN LRG LVL3 (GOWN DISPOSABLE) ×1 IMPLANT
GOWN STRL REUS W/ TWL LRG LVL3 (GOWN DISPOSABLE) ×3 IMPLANT
GOWN STRL REUS W/TWL LRG LVL3 (GOWN DISPOSABLE) ×6
GRAFT HEMASHIELD 8MM (Vascular Products) ×2 IMPLANT
GRAFT VASC STRG 30X8KNIT (Vascular Products) IMPLANT
HEMOSTAT SPONGE AVITENE ULTRA (HEMOSTASIS) IMPLANT
KIT BASIN OR (CUSTOM PROCEDURE TRAY) ×2 IMPLANT
KIT TURNOVER KIT B (KITS) ×2 IMPLANT
NS IRRIG 1000ML POUR BTL (IV SOLUTION) ×4 IMPLANT
PACK PERIPHERAL VASCULAR (CUSTOM PROCEDURE TRAY) ×2 IMPLANT
PAD ARMBOARD 7.5X6 YLW CONV (MISCELLANEOUS) ×4 IMPLANT
STAPLER VISISTAT 35W (STAPLE) IMPLANT
SUT PROLENE 5 0 C 1 24 (SUTURE) ×4 IMPLANT
SUT PROLENE 6 0 CC (SUTURE) ×2 IMPLANT
SUT SILK 2 0 PERMA HAND 18 BK (SUTURE) ×1 IMPLANT
SUT SILK 3 0 (SUTURE)
SUT SILK 3-0 18XBRD TIE 12 (SUTURE) IMPLANT
SUT VIC AB 2-0 SH 27 (SUTURE) ×4
SUT VIC AB 2-0 SH 27XBRD (SUTURE) ×2 IMPLANT
SUT VIC AB 3-0 SH 27 (SUTURE) ×4
SUT VIC AB 3-0 SH 27X BRD (SUTURE) ×2 IMPLANT
SUT VICRYL 4-0 PS2 18IN ABS (SUTURE) ×4 IMPLANT
TAPE UMBILICAL COTTON 1/8X30 (MISCELLANEOUS) ×1 IMPLANT
TOWEL GREEN STERILE (TOWEL DISPOSABLE) ×2 IMPLANT
TRAY FOLEY MTR SLVR 16FR STAT (SET/KITS/TRAYS/PACK) ×2 IMPLANT
UNDERPAD 30X30 (UNDERPADS AND DIAPERS) ×2 IMPLANT
WATER STERILE IRR 1000ML POUR (IV SOLUTION) ×2 IMPLANT

## 2018-10-21 NOTE — Transfer of Care (Signed)
Immediate Anesthesia Transfer of Care Note  Patient: Virginia Washington  Procedure(s) Performed: RIGHT TO LEFT FEMORAL ARTERY BYPASS GRAFT (Bilateral )  Patient Location: PACU  Anesthesia Type:General  Level of Consciousness: awake, alert  and oriented  Airway & Oxygen Therapy: Patient Spontanous Breathing and Patient connected to nasal cannula oxygen  Post-op Assessment: Report given to RN and Post -op Vital signs reviewed and stable  Post vital signs: Reviewed and stable  Last Vitals:  Vitals Value Taken Time  BP 109/68 10/21/2018  3:59 PM  Temp    Pulse 84 10/21/2018  4:01 PM  Resp 12 10/21/2018  4:01 PM  SpO2 100 % 10/21/2018  4:01 PM  Vitals shown include unvalidated device data.  Last Pain:  Vitals:   10/21/18 1005  TempSrc:   PainSc: 0-No pain      Patients Stated Pain Goal: 2 (70/35/00 9381)  Complications: No apparent anesthesia complications

## 2018-10-21 NOTE — H&P (Signed)
Patient is a 62 year old female who returns today for follow-up.  She was last seen November 2018.  She has a known left common iliac artery occlusion.  She was previously offered a femoral-femoral bypass last November but declined.  She continues to complain of now 3-1/2-year history of 1 block claudication in her left leg.  Primarily she experiences tightness in the left hip thigh and calf.  Unfortunately she continues to smoke.  She denies rest pain.  She has no nonhealing wounds.  Chronic medical problems remain tobacco abuse, COPD, chronic back pain, hyperlipidemia, hypertension all currently stable.  Review of systems: She states she has occasional chest pain with exertion.  She also gets short of breath with exertion.      Past Medical History:  Diagnosis Date  . Chronic back pain   . Constipation   . COPD (chronic obstructive pulmonary disease) ([place])   . DVT (deep venous thrombosis) ([place])   . GERD (gastroesophageal reflux disease)   . Headache   . Hyperlipidemia   . Hypertension   . Normal cardiac stress test 12/2014         Past Surgical History:  Procedure Laterality Date  . ABDOMINAL AORTOGRAM W/LOWER EXTREMITY N/A 10/26/2017   Procedure: ABDOMINAL AORTOGRAM W/LOWER EXTREMITY;  Surgeon: Elam Dutch, MD;  Location: [facility];  Service: Cardiovascular;  Laterality: N/A;  . ABDOMINAL HYSTERECTOMY    . CHOLECYSTECTOMY    . COLONOSCOPY  2007   Dr. Laural Golden: small internal hemorrhoids, otherwise normal. exam could be compromised due to quality of prep  . COLONOSCOPY N/A 02/15/2018   Dr. Oneida Alar: 2 simple adenomas, external and internal hemorrhoids. Next colonoscopy 2024-2026  . ESOPHAGOGASTRODUODENOSCOPY N/A 05/09/2018   Procedure: ESOPHAGOGASTRODUODENOSCOPY (EGD);  Surgeon: Danie Binder, MD;  Location: [facility];  Service: Endoscopy;  Laterality: N/A;  2:15pm- Possible Pyloric Dilation           Current Outpatient Medications on File  Prior to Visit  Medication Sig Dispense Refill  . albuterol (PROVENTIL HFA;VENTOLIN HFA) 108 (90 Base) MCG/ACT inhaler Inhale 2 puffs into the lungs every 4 (four) hours as needed for wheezing or shortness of breath. 1 Inhaler 0  . amLODipine (NORVASC) 5 MG tablet Take 5 mg by mouth daily.    Marland Kitchen atorvastatin (LIPITOR) 20 MG tablet Take 1 tablet (20 mg total) by mouth daily. 90 tablet 3  . omeprazole (PRILOSEC) 20 MG capsule 1 PO 30 MINS PRIOR TO BREAKFAST. 90 capsule 3  . amoxicillin-clavulanate (AUGMENTIN) 875-125 MG tablet Take 1 tablet by mouth 2 (two) times daily. (Patient not taking: Reported on 09/19/2018) 8 tablet 0  . cephALEXin (KEFLEX) 500 MG capsule Take 1 capsule (500 mg total) by mouth 3 (three) times daily. (Patient not taking: Reported on 09/19/2018) 30 capsule 0   No current facility-administered medications on file prior to visit.    Social History        Socioeconomic History  . Marital status: Widowed    Spouse name: Not on file  . Number of children: Not on file  . Years of education: Not on file  . Highest education level: Not on file  Occupational History  . Occupation: unemployed  Social Needs  . Financial resource strain: Not on file  . Food insecurity:    Worry: Not on file    Inability: Not on file  . Transportation needs:    Medical: Not on file    Non-medical: Not on file  Tobacco Use  .  Smoking status: Current Every Day Smoker    Packs/day: 0.25    Years: 40.00    Pack years: 10.00    Types: Cigarettes  . Smokeless tobacco: Never Used  Substance and Sexual Activity  . Alcohol use: No    Alcohol/week: 0.0 standard drinks  . Drug use: No  . Sexual activity: Not Currently  Lifestyle  . Physical activity:    Days per week: Not on file    Minutes per session: Not on file  . Stress: Not on file  Relationships  . Social connections:    Talks on phone: Not on file    Gets together: Not on file    Attends religious  service: Not on file    Active member of club or organization: Not on file    Attends meetings of clubs or organizations: Not on file    Relationship status: Not on file  . Intimate partner violence:    Fear of current or ex partner: Not on file    Emotionally abused: Not on file    Physically abused: Not on file    Forced sexual activity: Not on file  Other Topics Concern  . Not on file  Social History Narrative  . Not on file         Family History  Problem Relation Age of Onset  . Hypertension Mother   . Colon cancer Neg Hx   . Colon polyps Neg Hx     Physical exam:  Vitals:   10/21/18 0924  BP: 128/65  Pulse: 61  Resp: 18  Temp: 98.2 F (36.8 C)  TempSrc: Oral  SpO2: 97%  Weight: 61.2 kg  Height: 5' (1.524 m)    Chest: Clear to auscultation bilaterally  Cardiac: Regular rate and rhythm  Neck: No carotid bruit  Abdomen: Soft nontender  Extremities: 2+ right femoral pulse 2+ right dorsalis pedis pulse absent femoral pedal pulses left leg  Skin: No ulcer  ABIs from November 2018 were reviewed left side was 0.75 right side was 1.06  Assessment: Known left common iliac artery occlusion.  Her arteriogram recently showed chronic left iliac occlusiont.  She now wishes to pursue revascularization.  Plan: The patient needs to be started on aspirin daily for her peripheral arterial disease.    #2 again emphasized smoking cessation.    #3 femoral femoral bypass today  [name], MD [facility] Office: [phone] Pager: [phone]

## 2018-10-21 NOTE — Progress Notes (Signed)
2+ DP pulses bilaterally no hematoma in [place], MD [facility] Office: [phone] Pager: [phone]

## 2018-10-21 NOTE — Op Note (Signed)
Procedure: Right to left femoral-femoral bypass  Preoperative diagnosis: Left lower extremity claudication  Postoperative diagnosis: Same  Anesthesia: Gen.  Assistant: Waldron Labs RNFA  Upper findings: 8 mm Dacron graft  Operative details: After obtaining informed consent, the patient was taken to the operating room. The patient was placed in supine position on theoperating room table. After induction of general anesthesia, a Foley catheter was placed. Next the patient was prepped and draped in the usual sterile fashion from the umbilicus to the mid thigh. A longitudinal incision was made in the right groin. There were moderate adhesions to the right femoral artery. The profunda femoris and superficial femoral artery dissected free circumferentially. These were fairly small. They were soft with some posterior plaque in the common femoral.  Vessel loops were placed around all these. Attention was then turned to the left groin. In similar fashion a longitudinal incision was made in the left groin. The common  Femoral profunda femoris and superficial femoral arteries were dissected free circumferentially. Vessel loops were placed around all these. Next a subcutaneous tunnel was created over the suprapubic region connecting the left and right groin incisions. There was fairly dense scar in the midline from a prior laparotomy.  An 8 mm Dacron graft was brought through this. The patient was given 7000 units of intravenous heparin.  Next the right common femoral was controlled with a Henley clamp. The superficial femoral and profunda femoris arteries were controlled with vessel loops. A longitudinal opening was made in the common femoral artery.   I made this fairly long so it laid as a patch on the common femoral over the posterior plaque which was narrowing the femoral artery about 40%. The new 8 mm Dacron graft was spatulated and sewn end of graft to side of the artery using a running 5 0 prolene suture.  Just prior to completion of the anastomosis, it was forebled backbled and thoroughly flushed the anastomosis was secured and clamps released and there was good pulsatile flow in the graft immediately.  This was repaired with a single 6-0 Prolene suture. Hemostasis was obtained. Attention was then turned to the left groin. The native common femoral artery was controlled proximally with a Henley clamp. The SFA and profunda controlled with vessel loops.  A longitudinal opening was made in the left common femoral artery. The new 8 mm Dacron graft was cut to length and beveled. This was then sewn end of graft to side of native common femoral artery using a running 5-0 Prolene suture. Just prior to completion of the anastomosis it was forebled backbled and thoroughly flushed. the anastomosis was secured; clamps released; and there was pulsatile flow in the graft immediately. The patient had good Doppler signals bilaterally. There was also good pulsatile flow in the distal superficial femoral artery at the level of the groin. The patient was given 50 mg of protamine for heparin reversal. Hemostasis was obtained. The groin was then closed in multiple layers using running 2 0 and 3 0 Vicryl suture. The skin of both incisions was closed with a 4 0 Vicryl subcuticular stitch. Dermabond was applied to both incisions. The patient tolerated procedure well and there were no complications. The patient was extubated in the operating room and taken to recovery in stable condition.  [name], MD [facility] Office: [phone] Pager: [phone]

## 2018-10-21 NOTE — Discharge Instructions (Signed)
 [facility]  Discharge instructions  Lower Extremity Bypass Surgery  Please refer to the following instruction for your post-procedure care. Your surgeon or physician assistant will discuss any changes with you.  Activity  You are encouraged to walk as much as you can. You can slowly return to normal activities during the month after your surgery. Avoid strenuous activity and heavy lifting until your doctor tells you it's OK. Avoid activities such as vacuuming or swinging a golf club. Do not drive until your doctor give the OK and you are no longer taking prescription pain medications. It is also normal to have difficulty with sleep habits, eating and bowel movement after surgery. These will go away with time.  Bathing/Showering  You may shower after you go home. Do not soak in a bathtub, hot tub, or swim until the incision heals completely.  Incision Care  Clean your incision with mild soap and water. Shower every day. Pat the area dry with a clean towel. You do not need a bandage unless otherwise instructed. Do not apply any ointments or creams to your incision. If you have open wounds you will be instructed how to care for them or a visiting nurse may be arranged for you. If you have staples or sutures along your incision they will be removed at your post-op appointment. You may have skin glue on your incision. Do not peel it off. It will come off on its own in about one week. If you have a great deal of moisture in your groin, use a gauze help keep this area dry.  Diet  Resume your normal diet. There are no special food restrictions following this procedure. A low fat/ low cholesterol diet is recommended for all patients with vascular disease. In order to heal from your surgery, it is CRITICAL to get adequate nutrition. Your body requires vitamins, minerals, and protein. Vegetables are the best source of vitamins and minerals. Vegetables also provide the  perfect balance of protein. Processed food has little nutritional value, so try to avoid this.  Medications  Resume taking all your medications unless your doctor or nurse practitioner tells you not to. If your incision is causing pain, you may take over-the-counter pain relievers such as acetaminophen (Tylenol). If you were prescribed a stronger pain medication, please aware these medication can cause nausea and constipation. Prevent nausea by taking the medication with a snack or meal. Avoid constipation by drinking plenty of fluids and eating foods with high amount of fiber, such as fruits, vegetables, and grains. Take Colase 100 mg (an over-the-counter stool softener) twice a day as needed for constipation. Do not take Tylenol if you are taking prescription pain medications.  Follow Up  Our office will schedule a follow up appointment 2-3 weeks following discharge.  Please call us immediately for any of the following conditions  Severe or worsening pain in your legs or feet while at rest or while walking Increase pain, redness, warmth, or drainage (pus) from your incision site(s) Fever of 101 degree or higher The swelling in your leg with the bypass suddenly worsens and becomes more painful than when you were in the hospital If you have been instructed to feel your graft pulse then you should do so every day. If you can no longer feel this pulse, call the office immediately. Not all patients are given this instruction.  Leg swelling is common after leg bypass surgery.  The swelling should improve over a few months   following surgery. To improve the swelling, you may elevate your legs above the level of your heart while you are sitting or resting. Your surgeon or physician assistant may ask you to apply an ACE wrap or wear compression (TED) stockings to help to reduce swelling.  Reduce your risk of vascular disease  Stop smoking. If you would like help call [facility] at 1-800-QUIT-NOW  (1-800-784-8669) or [place] at 336-586-4000.  Manage your cholesterol Maintain a desired weight Control your diabetes weight Control your diabetes Keep your blood pressure down  If you have any questions, please call the office at [phone]   

## 2018-10-21 NOTE — Anesthesia Procedure Notes (Addendum)
Procedure Name: Intubation Date/Time: 10/21/2018 12:40 PM Performed by: Candis Shine, CRNA Pre-anesthesia Checklist: Patient identified, Emergency Drugs available, Suction available and Patient being monitored Patient Re-evaluated:Patient Re-evaluated prior to induction Oxygen Delivery Method: Circle System Utilized Preoxygenation: Pre-oxygenation with 100% oxygen Induction Type: IV induction Ventilation: Mask ventilation without difficulty and Oral airway inserted - appropriate to patient size Laryngoscope Size: Mac and 3 Grade View: Grade I Tube type: Oral Tube size: 7.0 mm Number of attempts: 1 Airway Equipment and Method: Stylet and Oral airway Placement Confirmation: ETT inserted through vocal cords under direct vision,  positive ETCO2 and breath sounds checked- equal and bilateral Secured at: 22 cm Tube secured with: Tape Dental Injury: Teeth and Oropharynx as per pre-operative assessment

## 2018-10-21 NOTE — Progress Notes (Signed)
Patient arrived on the unit from PACU, placed on tele ccmd notified, assessment completed see flowsheet, patient oriented to room and staff, CHG completed, bed in lowest position,call bell within reach, will monitor.

## 2018-10-22 ENCOUNTER — Inpatient Hospital Stay (HOSPITAL_COMMUNITY): Payer: Medicare HMO

## 2018-10-22 ENCOUNTER — Encounter (HOSPITAL_COMMUNITY): Payer: Self-pay | Admitting: Vascular Surgery

## 2018-10-22 ENCOUNTER — Other Ambulatory Visit: Payer: Self-pay

## 2018-10-22 DIAGNOSIS — Z9889 Other specified postprocedural states: Secondary | ICD-10-CM

## 2018-10-22 LAB — CBC
HCT: 41.5 % (ref 36.0–46.0)
Hemoglobin: 12.9 g/dL (ref 12.0–15.0)
MCH: 27.3 pg (ref 26.0–34.0)
MCHC: 31.1 g/dL (ref 30.0–36.0)
MCV: 87.7 fL (ref 80.0–100.0)
PLATELETS: 229 10*3/uL (ref 150–400)
RBC: 4.73 MIL/uL (ref 3.87–5.11)
RDW: 14.5 % (ref 11.5–15.5)
WBC: 10.5 10*3/uL (ref 4.0–10.5)
nRBC: 0 % (ref 0.0–0.2)

## 2018-10-22 LAB — BASIC METABOLIC PANEL
ANION GAP: 6 (ref 5–15)
BUN: 9 mg/dL (ref 6–20)
CALCIUM: 8.6 mg/dL — AB (ref 8.9–10.3)
CO2: 24 mmol/L (ref 22–32)
CREATININE: 1.12 mg/dL — AB (ref 0.44–1.00)
Chloride: 108 mmol/L (ref 98–111)
GFR calc non Af Amer: 52 mL/min — ABNORMAL LOW (ref >60)
Glucose, Bld: 149 mg/dL — ABNORMAL HIGH (ref 70–99)
POTASSIUM: 4.3 mmol/L (ref 3.5–5.1)
SODIUM: 138 mmol/L (ref 135–145)

## 2018-10-22 MED ORDER — INFLUENZA VAC SPLIT QUAD 0.5 ML IM SUSY
0.5000 mL | PREFILLED_SYRINGE | INTRAMUSCULAR | Status: AC
  Administered 2018-10-23: 0.5 mL via INTRAMUSCULAR
  Filled 2018-10-22: qty 0.5

## 2018-10-22 NOTE — Progress Notes (Signed)
OT Cancellation Note and Discharge  Patient Details Name: Virginia Washington MRN: [id number] DOB: 03-03-1956   Cancelled Treatment:    Reason Eval/Treat Not Completed: Other (comment). Received text from evaluating PT that pt is independent and does not have OT needs, we will sign off. Golden Circle, OTR/L Acute Rehab Services Pager [phone] Office [phone]     Almon Register 10/22/2018, 10:18 AM

## 2018-10-22 NOTE — Anesthesia Postprocedure Evaluation (Signed)
Anesthesia Post Note  Patient: Virginia Washington  Procedure(s) Performed: RIGHT TO LEFT FEMORAL ARTERY BYPASS GRAFT (Bilateral )     Patient location during evaluation: PACU Anesthesia Type: General Level of consciousness: awake and alert Pain management: pain level controlled Vital Signs Assessment: post-procedure vital signs reviewed and stable Respiratory status: spontaneous breathing, nonlabored ventilation, respiratory function stable and patient connected to nasal cannula oxygen Cardiovascular status: blood pressure returned to baseline and stable Postop Assessment: no apparent nausea or vomiting Anesthetic complications: no    Last Vitals:  Vitals:   10/22/18 0400 10/22/18 0412  BP: 107/67   Pulse: 63 65  Resp: 16 16  Temp:  36.5 C  SpO2: 94% 96%    Last Pain:  Vitals:   10/22/18 0512  TempSrc:   PainSc: Asleep                 Rukaya Kleinschmidt,W. EDMOND

## 2018-10-22 NOTE — Evaluation (Signed)
Physical Therapy Evaluation Patient Details Name: Virginia Washington MRN: [id number] DOB: 17-Jan-1956 Today's Date: 10/22/2018   History of Present Illness  62 yo female s/p RIGHT TO LEFT FEMORAL ARTERY BYPASS GRAFT (Bilateral)  Clinical Impression  Patient seen for therapy assessment.  Mobilizing well with no noted focal deficits at this time. Educated patient on safety with activity. No further acute PT needs. Will sign off.     Follow Up Recommendations No PT follow up    Equipment Recommendations  None recommended by PT    Recommendations for Other Services       Precautions / Restrictions Restrictions Weight Bearing Restrictions: No      Mobility  Bed Mobility Overal bed mobility: Independent             General bed mobility comments: no physical assist or cues required  Transfers Overall transfer level: Independent Equipment used: Rolling walker (2 wheeled);None             General transfer comment: performed initially with for conservative approach then performed without device, no difficulty good timing and no cues or assist required  Ambulation/Gait Ambulation/Gait assistance: Independent Gait Distance (Feet): 240 Feet Assistive device: None Gait Pattern/deviations: WFL(Within Functional Limits)     General Gait Details: steady with ambulation  Stairs Stairs: Yes Stairs assistance: Modified independent (Device/Increase time) Stair Management: One rail Left Number of Stairs: 4 General stair comments: no difficulty with stair negotation  Wheelchair Mobility    Modified Rankin (Stroke Patients Only)       Balance Overall balance assessment: Mild deficits observed, not formally tested                                           Pertinent Vitals/Pain Pain Assessment: Faces Faces Pain Scale: Hurts little more Pain Location: groin Pain Descriptors / Indicators: Sore Pain Intervention(s): Monitored during session    Home  Living Family/patient expects to be discharged to:: Private residence Living Arrangements: Other relatives Available Help at Discharge: Family Type of Home: House Home Access: Stairs to enter Entrance Stairs-Rails: Can reach both Entrance Stairs-Number of Steps: 4 Home Layout: One level        Prior Function Level of Independence: Independent               Hand Dominance   Dominant Hand: Right    Extremity/Trunk Assessment   Upper Extremity Assessment Upper Extremity Assessment: Overall WFL for tasks assessed    Lower Extremity Assessment Lower Extremity Assessment: Overall WFL for tasks assessed    Cervical / Trunk Assessment Cervical / Trunk Assessment: Kyphotic  Communication   Communication: No difficulties  Cognition Arousal/Alertness: Awake/alert Behavior During Therapy: WFL for tasks assessed/performed Overall Cognitive Status: Within Functional Limits for tasks assessed                                        General Comments      Exercises     Assessment/Plan    PT Assessment Patent does not need any further PT services  PT Problem List         PT Treatment Interventions      PT Goals (Current goals can be found in the Care Plan section)  Acute Rehab PT Goals PT Goal Formulation: All assessment and  education complete, DC therapy    Frequency     Barriers to discharge        Co-evaluation               AM-PAC PT "6 Clicks" Daily Activity  Outcome Measure Difficulty turning over in bed (including adjusting bedclothes, sheets and blankets)?: None Difficulty moving from lying on back to sitting on the side of the bed? : None Difficulty sitting down on and standing up from a chair with arms (e.g., wheelchair, bedside commode, etc,.)?: None Help needed moving to and from a bed to chair (including a wheelchair)?: None Help needed walking in hospital room?: A Little Help needed climbing 3-5 steps with a railing? : A  Little 6 Click Score: 22    End of Session Equipment Utilized During Treatment: Gait belt Activity Tolerance: Patient tolerated treatment well Patient left: in bed;with call bell/phone within reach Nurse Communication: Mobility status PT Visit Diagnosis: Difficulty in walking, not elsewhere classified (R26.2)    Time: 6606-3016 PT Time Calculation (min) (ACUTE ONLY): 20 min   Charges:   PT Evaluation $PT Eval Low Complexity: [place], PT DPT  Board Certified Neurologic Specialist Acute Rehabilitation Services Pager [phone] Office [phone]   Duncan Dull 10/22/2018, 10:37 AM

## 2018-10-22 NOTE — Progress Notes (Signed)
VASCULAR LAB PRELIMINARY  ARTERIAL  ABI completed:  Resting bilateral ankle-brachial indecis are within normal range. No evidence of significant bilateral lower extremities arterial disease. The bilateral toe-brachial indecis are normal.    RIGHT    LEFT    PRESSURE WAVEFORM  PRESSURE WAVEFORM  BRACHIAL 108 Triphasic BRACHIAL 113 Triphasic  DP 111 Biphasic DP 128 Triphasic  PT 126 Biphasic PT 113 Biphasic  GREAT TOE 126  GREAT TOE 96     RIGHT LEFT  ABI/TBI 1.12/1.12 1.13/0.85    Maki Sweetser H Naman Spychalski(RDMS RVT) 10/22/2018, 11:26 AM

## 2018-10-22 NOTE — Progress Notes (Signed)
Patient ambulated in the hall independently, ambulation well tolerated, will monitor.

## 2018-10-22 NOTE — Progress Notes (Signed)
[facility]  Subjective  - a little sore   Objective 107/67 65 97.7 F (36.5 C) (Oral) 16 96%  Intake/Output Summary (Last 24 hours) at 10/22/2018 3295 Last data filed at 10/22/2018 0100 Gross per 24 hour  Intake 1406.55 ml  Output 470 ml  Net 936.55 ml   2+ DP pulses Groins no hematoma  Assessment/Planning: S/p Fem Fem Patent graft Wants to go home today Will walk more this morning probably d/c later today  [name] 10/22/2018 7:22 AM --  Laboratory Lab Results: Recent Labs    10/21/18 1846 10/22/18 0032  WBC 9.5 10.5  HGB 13.2 12.9  HCT 43.5 41.5  PLT 219 229   BMET Recent Labs    10/21/18 1846 10/22/18 0032  NA  --  138  K  --  4.3  CL  --  108  CO2  --  24  GLUCOSE  --  149*  BUN  --  9  CREATININE 1.00 1.12*  CALCIUM  --  8.6*    COAG Lab Results  Component Value Date   INR 1.17 10/18/2018   INR 1.40 02/05/2012   No results found for: PTT

## 2018-10-23 ENCOUNTER — Telehealth: Payer: Self-pay | Admitting: Vascular Surgery

## 2018-10-23 ENCOUNTER — Ambulatory Visit: Payer: Medicare HMO | Admitting: Cardiology

## 2018-10-23 MED ORDER — OXYCODONE HCL 5 MG PO TABS: 5.0000 mg | ORAL_TABLET | Freq: Four times a day (QID) | ORAL | 0 refills | Status: DC | PRN

## 2018-10-23 NOTE — Care Management Note (Signed)
Case Management Note Marvetta Gibbons RN, BSN Transitions of Care Unit 4E- RN Case Manager [phone]  Patient Details  Name: Virginia Washington MRN: [id number] Date of Birth: 04/30/1956  Subjective/Objective:   Pt admitted s/p fem fem bypass                 Action/Plan: PTA pt lived at home, plan to return home- no CM needs noted for transition home   Expected Discharge Date:  10/23/18               Expected Discharge Plan:  Home/Self Care  In-House Referral:  NA  Discharge planning Services  CM Consult  Post Acute Care Choice:  NA Choice offered to:  NA  DME Arranged:    DME Agency:     HH Arranged:    [place] Agency:     Status of Service:  Completed, signed off  If discussed at [place] of Stay Meetings, dates discussed:  Discharge Disposition: home/self care    Additional Comments:  Dawayne Patricia, RN 10/23/2018, 10:49 AM

## 2018-10-23 NOTE — Discharge Summary (Signed)
Physician Discharge Summary   Patient ID: Virginia Washington [id number] 62 y.o. 1955/12/27  Admit date: 10/21/2018  Discharge date and time: 11/02/18   Admitting Physician: Elam Dutch, MD   Discharge Physician: same  Admission Diagnoses: PERIPHERAL VASCULAR DISEASE  Discharge Diagnoses: same  Admission Condition: poor  Discharged Condition: fair  Indication for Admission: left lower extremity claudication  Hospital Course: [name] is a 62y.o female who came in as an outpatient right to left femoral-femoral bypass by Dr. Oneida Alar on 10/21/18 due to left lower extremity claudication.  She tolerated the procedure well and was admitted post operatively.  POD#1 involved pain control and increasing mobility.  POD#2 pain was better controlled.  She is ambulating through the halls.  She has maintained palpable DP pulses bilaterally.  Groin incisions have remained unremarkable.  She will be prescribed 2-3 days of narcotic pain medication for continued post operative pain control.  She will follow up in office in about 2 weeks with Dr. Oneida Alar.  Discharge instructions were reviewed with the patient and she voices her understanding.  She will be discharged this morning in stable condition.  Consults: None  Treatments: surgery: right to left femoral to femoral bypass by Dr. Oneida Alar  Discharge Exam: see progress note 10/23/18 Vitals:   10/22/18 2351 10/23/18 0419  BP: 107/64 112/64  Pulse: 92 80  Resp: 18 (!) 21  Temp: 97.8 F (36.6 C) 98.9 F (37.2 C)  SpO2: 95% 93%     Disposition: Discharge disposition: 01-Home or Self Care       - For [facility] Registry use ---  Post-op:  Wound infection: No  Graft infection: No  Transfusion: No  If yes,  units given New Arrhythmia: No Ipsilateral amputation: [ x] no, [ ]  Minor, [ ]  BKA, [ ]  AKA Discharge patency: [x ] Primary, [ ]  Primary assisted, [ ]  Secondary, [ ]  Occluded Patency judged by: [ ]  Dopper only, [ ]  Palpable graft pulse,  [ x] Palpable distal pulse, [ ]  ABI inc. > 0.15, [ ]  Duplex D/C Ambulatory Status: Ambulatory  Complications: MI: [x ] No, [ ]  Troponin only, [ ]  EKG or Clinical CHF: No Resp failure: [x ] none, [ ]  Pneumonia, [ ]  Ventilator Chg in renal function: [ x] none, [ ]  Inc. Cr > 0.5, [ ]  Temp. Dialysis, [ ]  Permanent dialysis Stroke: [x ] None, [ ]  Minor, [ ]  Major Return to OR: No  Reason for return to OR: [ ]  Bleeding, [ ]  Infection, [ ]  Thrombosis, [ ]  Revision  Discharge medications: Statin use:  Yes ASA use:  Yes Plavix use:  No  for medical reason not indicated Beta blocker use: No  for medical reason not indicated Coumadin use: No  for medical reason not indicated    Patient Instructions:  Allergies as of 10/23/2018   No Known Allergies     Medication List    TAKE these medications   amLODipine 5 MG tablet Commonly known as:  NORVASC Take 5 mg by mouth daily.   aspirin EC 325 MG tablet Take 1 tablet (325 mg total) by mouth daily.   atorvastatin 20 MG tablet Commonly known as:  LIPITOR Take 1 tablet (20 mg total) by mouth daily.   LINZESS 290 MCG Caps capsule Generic drug:  linaclotide Take 290 mcg by mouth at bedtime.   oxyCODONE 5 MG immediate release tablet Commonly known as:  Oxy IR/ROXICODONE Take 1 tablet (5 mg total) by mouth every 6 (six)  hours as needed for moderate pain.      Activity: activity as tolerated Diet: regular diet Wound Care: keep wound clean and dry  Follow-up with Dr. Oneida Alar in 2 weeks.  SignedDagoberto Ligas 10/23/2018 9:38 AM

## 2018-10-23 NOTE — Progress Notes (Signed)
Patient in a stable condition, discharge education reviewed with patient, she veblaised understanding, iv removed , tele dc , ccmd notified, patient belongings at bedside, prescription given to patient, patient awaiting her family for transportation home.

## 2018-10-23 NOTE — Telephone Encounter (Signed)
-----   Message from Mena Goes, RN sent at 10/21/2018  3:40 PM EST ----- Regarding: 2-3 weeks postop fem-fem bypass with Dr. Oneida Alar   ----- Message ----- From: Ulyses Amor, [place] Sent: 10/21/2018   3:24 PM EST To: [facility] Admin Pool, [facility] Charge Pool   S/P fem-fem bypass f/u with Dr. Oneida Alar in 2-3 weeks.

## 2018-10-23 NOTE — Progress Notes (Signed)
[facility]  Subjective  - groins sore, claudication resolved   Objective 112/64 80 98.9 F (37.2 C) (Oral) (!) 21 93%  Intake/Output Summary (Last 24 hours) at 10/23/2018 0829 Last data filed at 10/22/2018 2100 Gross per 24 hour  Intake 360 ml  Output -  Net 360 ml   Groin incisions healing 2+ DP pulses  Assessment/Planning: D/c home today  [name] 10/23/2018 8:29 AM --  Laboratory Lab Results: Recent Labs    10/21/18 1846 10/22/18 0032  WBC 9.5 10.5  HGB 13.2 12.9  HCT 43.5 41.5  PLT 219 229   BMET Recent Labs    10/21/18 1846 10/22/18 0032  NA  --  138  K  --  4.3  CL  --  108  CO2  --  24  GLUCOSE  --  149*  BUN  --  9  CREATININE 1.00 1.12*  CALCIUM  --  8.6*    COAG Lab Results  Component Value Date   INR 1.17 10/18/2018   INR 1.40 02/05/2012   No results found for: PTT

## 2018-10-23 NOTE — Telephone Encounter (Signed)
sch appt lvm 11/07/18 345pm p/o MD

## 2018-11-07 ENCOUNTER — Other Ambulatory Visit: Payer: Self-pay

## 2018-11-07 ENCOUNTER — Ambulatory Visit (INDEPENDENT_AMBULATORY_CARE_PROVIDER_SITE_OTHER): Payer: Self-pay | Admitting: Vascular Surgery

## 2018-11-07 VITALS — BP 122/78 | HR 80 | Temp 97.7°F | Resp 16 | Ht 62.0 in | Wt 134.0 lb

## 2018-11-07 DIAGNOSIS — I739 Peripheral vascular disease, unspecified: Secondary | ICD-10-CM

## 2018-11-07 NOTE — Progress Notes (Signed)
Patient is a 62 year old female who returns for postoperative follow-up today.  She underwent right to left femoral-femoral bypass on October 21, 2018.  She denies any incisional drainage.  She has no fever or chills.  Her claudication symptoms are completely resolved.  Physical exam:  Vitals:   11/07/18 1559  BP: 122/78  Pulse: 80  Resp: 16  Temp: 97.7 F (36.5 C)  TempSrc: Oral  SpO2: 100%  Weight: 134 lb (60.8 kg)  Height: 5\' 2"  (1.575 m)    Extremities: Bilateral groin incisions the left groin is well-healed.  There is some maceration at the inferior aspect of the right groin incision slight separation.  The patient states she has not taken a shower or bath since being discharged from the hospital 2 weeks ago.  Data: Patient had bilateral ABIs performed on November 5 which showed an improvement in her ABIs from 0.6 to greater than 1 and normal bilaterally  Assessment: Patent femoral-femoral bypass currently claudication free  Plan: The patient will continue to wash the incisions in her groins once daily with soap and water.  If the right groin incision has not completely healed within 1 month she will call me.  Otherwise she will follow-up with our nurse practitioner with repeat ABIs in 6 months time.  [name], MD [facility] Office: [phone] Pager: [phone]

## 2018-11-11 ENCOUNTER — Encounter: Payer: Self-pay | Admitting: Gastroenterology

## 2018-11-11 ENCOUNTER — Ambulatory Visit: Payer: Medicare HMO | Admitting: Nurse Practitioner

## 2018-11-11 ENCOUNTER — Telehealth: Payer: Self-pay | Admitting: Gastroenterology

## 2018-11-11 NOTE — Telephone Encounter (Signed)
PATIENT WAS A NO SHOW AND LETTER SENT  °

## 2018-11-11 NOTE — Progress Notes (Deleted)
Referring Provider: [name], MD Primary Care Physician:  [name], MD Primary GI:  Dr. Oneida Alar  No chief complaint on file.   HPI:   Virginia Washington is a 62 y.o. female who presents for follow-up on abdominal pain.  Patient was last seen in our office 04/15/2018 for abdominal pain and constipation.  Previous colonoscopy completed with 2 simple adenomas, internal and external hemorrhoids with next colonoscopy due 2020 03/20/2025.  Multiple CT scans for abdominal pain over the years.  History of SBO in August 2017 which was conservatively managed.  CT without contrast March 2019 without obvious acute abnormalities.  In the ED of March 2019 for abdominal pain, no nausea.  Hurts when eating "just certain foods".  No dysphasia, denies reflux/heartburn.  Linzess effective for constipation management.  Left lower quadrant pain improved after a bowel movement and thinks she may be too full.  Feels her food may not be going all the way down and just "staying up" while pointing to the upper abdomen.  Recommended EGD, continue Linzess, follow-up in 3 months.  EGD completed 05/09/2018 which found dysphasia due to a web and poor dentition, mild gastritis status post biopsy.  Surgical pathology found the biopsies to be reactive gastropathy/gastritis.  Recommended limit diet to chopped/ground until dental issues addressed, continue current medications, follow-up in 4 months.  The patient was a no-show to her follow-up visit.  Today she states   Past Medical History:  Diagnosis Date  . Chronic back pain   . Constipation   . Constipation   . COPD (chronic obstructive pulmonary disease) ([place])   . DVT (deep venous thrombosis) ([place])   . GERD (gastroesophageal reflux disease)   . Headache   . History of kidney stones   . Hyperlipidemia   . Hypertension     Past Surgical History:  Procedure Laterality Date  . 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  Normal right aortoiliac vessels.  Three-vessel runoff bilaterally.  (Sluggish flow of the left side due to occlusion.  . ABDOMINAL HYSTERECTOMY    . CHOLECYSTECTOMY    . COLONOSCOPY  2007   Dr. Laural Golden: small internal hemorrhoids, otherwise normal. exam could be compromised due to quality of prep  . COLONOSCOPY N/A 02/15/2018   Dr. Oneida Alar: 2 simple adenomas, external and internal hemorrhoids. Next colonoscopy 2024-2026  . ESOPHAGOGASTRODUODENOSCOPY N/A 05/09/2018   Procedure: ESOPHAGOGASTRODUODENOSCOPY (EGD);  Surgeon: Danie Binder, MD;  [facility];  Service: Endoscopy;  Laterality: N/A;  2:15pm- Possible Pyloric Dilation  . FEMORAL-FEMORAL BYPASS GRAFT Bilateral 10/21/2018   Procedure: RIGHT TO LEFT FEMORAL ARTERY BYPASS GRAFT;  Surgeon: Elam Dutch, MD;  Location: [facility];  Service: Vascular;  Laterality: Bilateral;  . LOWER EXTREMITY ANGIOGRAPHY N/A 09/27/2018   Procedure: LOWER EXTREMITY ANGIOGRAPHY;  Surgeon: Elam Dutch, MD;  Location: [facility];  Service: Cardiovascular;  Laterality: N/A;  . NM MYOVIEW LTD  12/2014   [facility]: Normal Myoview.  Nonischemic.  Marland Kitchen TRANSTHORACIC ECHOCARDIOGRAM  12/2011   EF 55 to 60%.  No regional wall motion normality.  GRII DD.  No significant valve disease.    Current Outpatient Medications  Medication Sig Dispense Refill  . amLODipine (NORVASC) 5 MG tablet Take 5 mg by mouth daily.    Marland Kitchen  aspirin EC 325 MG tablet Take 1 tablet (325 mg total) by mouth daily. 100 tablet 3  . atorvastatin (LIPITOR) 20 MG tablet Take 1 tablet (20 mg total) by mouth daily. 90 tablet 3  . linaclotide (LINZESS) 290 MCG CAPS capsule Take 290 mcg by mouth at bedtime.    Marland Kitchen oxyCODONE (OXY IR/ROXICODONE) 5 MG immediate  release tablet Take 1 tablet (5 mg total) by mouth every 6 (six) hours as needed for moderate pain. (Patient not taking: Reported on 11/07/2018) 20 tablet 0   No current facility-administered medications for this visit.     Allergies as of 11/11/2018  . (No Known Allergies)    Family History  Problem Relation Age of Onset  . Hypertension Mother   . Peripheral Artery Disease Brother   . Colon cancer Neg Hx   . Colon polyps Neg Hx     Social History   Socioeconomic History  . Marital status: Widowed    Spouse name: Not on file  . Number of children: Not on file  . Years of education: Not on file  . Highest education level: Not on file  Occupational History  . Occupation: unemployed    Comment: Draws pension from her husband's life insurance policy  Social Needs  . Financial resource strain: Not on file  . Food insecurity:    Worry: Not on file    Inability: Not on file  . Transportation needs:    Medical: Not on file    Non-medical: Not on file  Tobacco Use  . Smoking status: Current Every Day Smoker    Packs/day: 0.25    Years: 40.00    Pack years: 10.00    Types: Cigarettes  . Smokeless tobacco: Never Used  Substance and Sexual Activity  . Alcohol use: No    Alcohol/week: 0.0 standard drinks  . Drug use: No  . Sexual activity: Not Currently  Lifestyle  . Physical activity:    Days per week: Not on file    Minutes per session: Not on file  . Stress: Not on file  Relationships  . Social connections:    Talks on phone: Not on file    Gets together: Not on file    Attends religious service: Not on file    Active member of club or organization: Not on file    Attends meetings of clubs or organizations: Not on file    Relationship status: Not on file  Other Topics Concern  . Not on file  Social History Narrative   Widowed mother 71, grandmother of 29 with one great grandchild.  She currently lives alone.  She completed ninth grade.  Smokes a pack a day for 42  years.  Tries to walks for about 5 minutes at x7 days a week with plans to help revascularization.    Review of Systems: Complete ROS negative except as per HPI.   Physical Exam: There were no vitals taken for this visit. General:   Alert and oriented. Pleasant and cooperative. Well-nourished and well-developed.  Head:  Normocephalic and atraumatic. Eyes:  Without icterus, sclera clear and conjunctiva pink.  Ears:  Normal auditory acuity. Mouth:  No deformity or lesions, oral mucosa pink.  Throat/Neck:  Supple, without mass or thyromegaly. Cardiovascular:  S1, S2 present without murmurs appreciated. Normal pulses noted. Extremities without clubbing or edema. Respiratory:  Clear to auscultation bilaterally. No wheezes, rales, or rhonchi. No distress.  Gastrointestinal:  +BS, soft, non-tender and non-distended. No HSM noted. No  guarding or rebound. No masses appreciated.  Rectal:  Deferred  Musculoskalatal:  Symmetrical without gross deformities. Normal posture. Skin:  Intact without significant lesions or rashes. Neurologic:  Alert and oriented x4;  grossly normal neurologically. Psych:  Alert and cooperative. Normal mood and affect. Heme/Lymph/Immune: No significant cervical adenopathy. No excessive bruising noted.    11/11/2018 7:45 AM   Disclaimer: This note was dictated with voice recognition software. Similar sounding words can inadvertently be transcribed and may not be corrected upon review.

## 2018-12-06 DIAGNOSIS — Z0001 Encounter for general adult medical examination with abnormal findings: Secondary | ICD-10-CM | POA: Diagnosis not present

## 2018-12-06 DIAGNOSIS — Z0000 Encounter for general adult medical examination without abnormal findings: Secondary | ICD-10-CM | POA: Diagnosis not present

## 2018-12-06 DIAGNOSIS — Z1389 Encounter for screening for other disorder: Secondary | ICD-10-CM | POA: Diagnosis not present

## 2018-12-06 DIAGNOSIS — E669 Obesity, unspecified: Secondary | ICD-10-CM | POA: Diagnosis not present

## 2018-12-06 DIAGNOSIS — M1990 Unspecified osteoarthritis, unspecified site: Secondary | ICD-10-CM | POA: Diagnosis not present

## 2018-12-06 DIAGNOSIS — I10 Essential (primary) hypertension: Secondary | ICD-10-CM | POA: Diagnosis not present

## 2018-12-06 DIAGNOSIS — I739 Peripheral vascular disease, unspecified: Secondary | ICD-10-CM | POA: Diagnosis not present

## 2018-12-06 DIAGNOSIS — Z1331 Encounter for screening for depression: Secondary | ICD-10-CM | POA: Diagnosis not present

## 2018-12-06 DIAGNOSIS — E785 Hyperlipidemia, unspecified: Secondary | ICD-10-CM | POA: Diagnosis not present

## 2019-01-30 DIAGNOSIS — I739 Peripheral vascular disease, unspecified: Secondary | ICD-10-CM | POA: Diagnosis not present

## 2019-01-30 DIAGNOSIS — I10 Essential (primary) hypertension: Secondary | ICD-10-CM | POA: Diagnosis not present

## 2019-01-30 DIAGNOSIS — E785 Hyperlipidemia, unspecified: Secondary | ICD-10-CM | POA: Diagnosis not present

## 2019-01-30 DIAGNOSIS — R69 Illness, unspecified: Secondary | ICD-10-CM | POA: Diagnosis not present

## 2019-02-08 ENCOUNTER — Other Ambulatory Visit: Payer: Self-pay

## 2019-02-08 ENCOUNTER — Emergency Department (HOSPITAL_COMMUNITY): Payer: Medicare HMO

## 2019-02-08 ENCOUNTER — Encounter (HOSPITAL_COMMUNITY): Payer: Self-pay | Admitting: Emergency Medicine

## 2019-02-08 ENCOUNTER — Emergency Department (HOSPITAL_COMMUNITY)
Admission: EM | Admit: 2019-02-08 | Discharge: 2019-02-08 | Disposition: A | Discharge status: Home / Self Care | Payer: Medicare HMO | Attending: Emergency Medicine | Admitting: Emergency Medicine

## 2019-02-08 DIAGNOSIS — Z79899 Other long term (current) drug therapy: Secondary | ICD-10-CM | POA: Insufficient documentation

## 2019-02-08 DIAGNOSIS — R05 Cough: Secondary | ICD-10-CM | POA: Diagnosis not present

## 2019-02-08 DIAGNOSIS — I10 Essential (primary) hypertension: Secondary | ICD-10-CM | POA: Insufficient documentation

## 2019-02-08 DIAGNOSIS — J40 Bronchitis, not specified as acute or chronic: Secondary | ICD-10-CM | POA: Diagnosis not present

## 2019-02-08 DIAGNOSIS — F17210 Nicotine dependence, cigarettes, uncomplicated: Secondary | ICD-10-CM | POA: Diagnosis not present

## 2019-02-08 DIAGNOSIS — Z7982 Long term (current) use of aspirin: Secondary | ICD-10-CM | POA: Insufficient documentation

## 2019-02-08 DIAGNOSIS — J449 Chronic obstructive pulmonary disease, unspecified: Secondary | ICD-10-CM | POA: Insufficient documentation

## 2019-02-08 DIAGNOSIS — Z720 Tobacco use: Secondary | ICD-10-CM

## 2019-02-08 DIAGNOSIS — R69 Illness, unspecified: Secondary | ICD-10-CM | POA: Diagnosis not present

## 2019-02-08 MED ORDER — DOXYCYCLINE HYCLATE 100 MG PO TABS
100.0000 mg | ORAL_TABLET | Freq: Once | ORAL | Status: AC
  Administered 2019-02-08: 100 mg via ORAL
  Filled 2019-02-08: qty 1

## 2019-02-08 MED ORDER — DOXYCYCLINE HYCLATE 100 MG PO CAPS: 100.0000 mg | ORAL_CAPSULE | Freq: Two times a day (BID) | ORAL | 0 refills | Status: DC

## 2019-02-08 NOTE — Discharge Instructions (Signed)
Try to stop smoking. We sent a prescription to treat bronchitis to your pharmacy.

## 2019-02-08 NOTE — ED Triage Notes (Signed)
Pt reports cough that started last night with subjective fever. No OTC medication.

## 2019-02-08 NOTE — ED Provider Notes (Signed)
[facility] EMERGENCY DEPARTMENT Provider Note   CSN: [id number] Arrival date & time: 02/08/19  1606    History   Chief Complaint Chief Complaint  Patient presents with  . Cough    HPI Virginia Washington is a 63 y.o. female.     HPI   She is here for evaluation of cough with subjective fever.  The cough is occasionally productive.  She is a cigarette smoker.  She denies nausea, vomiting, weakness or dizziness.  She is taking her usual medications.  These include an inhaler.  There are no other known modifying factors.   Past Medical History:  Diagnosis Date  . Chronic back pain   . Constipation   . Constipation   . COPD (chronic obstructive pulmonary disease) ([place])   . DVT (deep venous thrombosis) ([place])   . GERD (gastroesophageal reflux disease)   . Headache   . History of kidney stones   . Hyperlipidemia   . Hypertension     Patient Active Problem List   Diagnosis Date Noted  . Preop cardiovascular exam 09/23/2018  . PAD (peripheral artery disease) ([facility]) -left iliac occlusion 09/23/2018  . Esophageal dysphagia   . SBO (small bowel obstruction) ([place]) 04/29/2018  . GERD (gastroesophageal reflux disease) 04/28/2018  . Hypokalemia 04/28/2018  . Pulmonary nodule 04/28/2018  . Hypernatremia 04/28/2018  . Enteritis 04/28/2018  . Encounter for screening colonoscopy 01/16/2018  . Abdominal pain 11/16/2017  . Constipation 11/16/2017  . Partial small bowel obstruction ([place]) 08/16/2016  . Hyperlipidemia 08/16/2016  . Chest pain 12/12/2014  . Hypertension 12/12/2014  . Tobacco abuse 10/29/2011    Past Surgical History:  Procedure Laterality Date  . 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  Normal right aortoiliac  vessels.  Three-vessel runoff bilaterally.  (Sluggish flow of the left side due to occlusion.  . ABDOMINAL HYSTERECTOMY    . CHOLECYSTECTOMY    . COLONOSCOPY  2007   Dr. Laural Golden: small internal hemorrhoids, otherwise normal. exam could be compromised due to quality of prep  . COLONOSCOPY N/A 02/15/2018   Dr. Oneida Alar: 2 simple adenomas, external and internal hemorrhoids. Next colonoscopy 2024-2026  . ESOPHAGOGASTRODUODENOSCOPY N/A 05/09/2018   Procedure: ESOPHAGOGASTRODUODENOSCOPY (EGD);  Surgeon: Danie Binder, MD;  [facility];  Service: Endoscopy;  Laterality: N/A;  2:15pm- Possible Pyloric Dilation  . FEMORAL-FEMORAL BYPASS GRAFT Bilateral 10/21/2018   Procedure: RIGHT TO LEFT FEMORAL ARTERY BYPASS GRAFT;  Surgeon: Elam Dutch, MD;  Location: [facility];  Service: Vascular;  Laterality: Bilateral;  . LOWER EXTREMITY ANGIOGRAPHY N/A 09/27/2018   Procedure: LOWER EXTREMITY ANGIOGRAPHY;  Surgeon: Elam Dutch, MD;  Location: [facility];  Service: Cardiovascular;  Laterality: N/A;  . NM MYOVIEW LTD  12/2014   [facility]: Normal Myoview.  Nonischemic.  Marland Kitchen TRANSTHORACIC ECHOCARDIOGRAM  12/2011   EF 55 to 60%.  No regional wall motion normality.  GRII DD.  No significant valve disease.     OB History    Gravida      Para      Term      Preterm      AB      Living  3     SAB      TAB      Ectopic  Multiple      Live Births               Home Medications    Prior to Admission medications   Medication Sig Start Date End Date Taking? Authorizing Provider  amLODipine (NORVASC) 5 MG tablet Take 5 mg by mouth daily.    [provider]  aspirin EC 325 MG tablet Take 1 tablet (325 mg total) by mouth daily. 09/27/18 09/27/19  Elam Dutch, MD  atorvastatin (LIPITOR) 20 MG tablet Take 1 tablet (20 mg total) by mouth daily. 12/23/14   Fay Records, MD  doxycycline (VIBRAMYCIN) 100 MG capsule Take 1 capsule (100 mg total) by mouth  2 (two) times daily. 02/08/19   Daleen Bo, MD  linaclotide [facility]) 290 MCG CAPS capsule Take 290 mcg by mouth at bedtime.    [provider]  oxyCODONE (OXY IR/ROXICODONE) 5 MG immediate release tablet Take 1 tablet (5 mg total) by mouth every 6 (six) hours as needed for moderate pain. Patient not taking: Reported on 11/07/2018 10/23/18   Dagoberto Ligas, PA-C    Family History Family History  Problem Relation Age of Onset  . Hypertension Mother   . Peripheral Artery Disease Brother   . Colon cancer Neg Hx   . Colon polyps Neg Hx     Social History Social History   Tobacco Use  . Smoking status: Current Every Day Smoker    Packs/day: 0.50    Years: 40.00    Pack years: 20.00    Types: Cigarettes  . Smokeless tobacco: Never Used  Substance Use Topics  . Alcohol use: No    Alcohol/week: 0.0 standard drinks  . Drug use: No     Allergies   Patient has no known allergies.   Review of Systems Review of Systems  All other systems reviewed and are negative.    Physical Exam Updated Vital Signs BP 104/66 (BP Location: Right Arm)   Pulse 90   Temp 99 F (37.2 C) (Oral)   Resp 16   Ht 5\' 2"  (1.575 m)   Wt 61.2 kg   SpO2 98%   BMI 24.69 kg/m   Physical Exam Vitals signs and nursing note reviewed.  Constitutional:      General: She is not in acute distress.    Appearance: She is well-developed. She is not ill-appearing, toxic-appearing or diaphoretic.     Comments: Appears older than stated age.  HENT:     Head: Normocephalic and atraumatic.     Right Ear: External ear normal.     Left Ear: External ear normal.  Eyes:     Conjunctiva/sclera: Conjunctivae normal.     Pupils: Pupils are equal, round, and reactive to light.  Neck:     Musculoskeletal: Normal range of motion and neck supple.     Trachea: Phonation normal.  Cardiovascular:     Rate and Rhythm: Normal rate and regular rhythm.     Heart sounds: Normal heart sounds.  Pulmonary:      Effort: Pulmonary effort is normal. No respiratory distress.     Breath sounds: Normal breath sounds. No stridor. No wheezing or rhonchi.  Abdominal:     Palpations: Abdomen is soft.     Tenderness: There is no abdominal tenderness.  Musculoskeletal: Normal range of motion.  Skin:    General: Skin is warm and dry.  Neurological:     Mental Status: She is alert and oriented to person, place, and time.  Cranial Nerves: No cranial nerve deficit.     Sensory: No sensory deficit.     Motor: No abnormal muscle tone.     Coordination: Coordination normal.  Psychiatric:        Mood and Affect: Mood normal.        Behavior: Behavior normal.        Thought Content: Thought content normal.        Judgment: Judgment normal.      ED Treatments / Results  Labs (all labs ordered are listed, but only abnormal results are displayed) Labs Reviewed - No data to display  EKG None  Radiology Dg Chest 2 View  Result Date: 02/08/2019 CLINICAL DATA:  Cough, chills EXAM: CHEST - 2 VIEW COMPARISON:  04/28/2018 FINDINGS: Heart is normal size. Mild peribronchial thickening and interstitial prominence. No confluent opacities or effusions. No acute bony abnormality. IMPRESSION: Mild bronchitic changes. Electronically Signed   By: Rolm Baptise M.D.   On: 02/08/2019 17:28    Procedures Procedures (including critical care time)  Medications Ordered in ED Medications  doxycycline (VIBRA-TABS) tablet 100 mg (has no administration in time range)     Initial Impression / Assessment and Plan / ED Course  I have reviewed the triage vital signs and the nursing notes.  Pertinent labs & imaging results that were available during my care of the patient were reviewed by me and considered in my medical decision making (see chart for details).  Clinical Course as of Feb 08 1753  Sat Feb 08, 2019  1752 Bronchitis present, no CHF or infiltrate, images reviewed by me  DG Chest 2 View [EW]    Clinical  Course User Index [EW] Daleen Bo, MD        Patient Vitals for the past 24 hrs:  BP Temp Temp src Pulse Resp SpO2 Height Weight  02/08/19 1615 104/66 99 F (37.2 C) Oral 90 16 98 % - -  02/08/19 1613 - - - - - - 5\' 2"  (1.575 m) 61.2 kg    5:53 PM Reevaluation with update and discussion. After initial assessment and treatment, an updated evaluation reveals no change in clinical status.  Findings discussed with the patient all questions were answered.Daleen Bo   Medical Decision Making: Evaluation consistent with tobacco related bronchitis.  Doubt pneumonia.  Doubt serious bacterial infection.  No indication for further ED treatment or hospitalization at this time.  CRITICAL CARE-no Performed by: Daleen Bo   Nursing Notes Reviewed/ Care Coordinated Applicable Imaging Reviewed Interpretation of Laboratory Data incorporated into ED treatment  The patient appears reasonably screened and/or stabilized for discharge and I doubt any other medical condition or other [facility] requiring further screening, evaluation, or treatment in the ED at this time prior to discharge.  Plan: Home Medications-continue current medications; Home Treatments-stop smoking; return here if the recommended treatment, does not improve the symptoms; Recommended follow up-PCP, PRN     Final Clinical Impressions(s) / ED Diagnoses   Final diagnoses:  Bronchitis  Tobacco abuse    ED Discharge Orders         Ordered    doxycycline (VIBRAMYCIN) 100 MG capsule  2 times daily     02/08/19 1754           Daleen Bo, MD 02/08/19 1755

## 2019-04-29 DIAGNOSIS — I739 Peripheral vascular disease, unspecified: Secondary | ICD-10-CM | POA: Diagnosis not present

## 2019-04-29 DIAGNOSIS — J410 Simple chronic bronchitis: Secondary | ICD-10-CM | POA: Diagnosis not present

## 2019-04-29 DIAGNOSIS — I10 Essential (primary) hypertension: Secondary | ICD-10-CM | POA: Diagnosis not present

## 2019-04-29 DIAGNOSIS — R69 Illness, unspecified: Secondary | ICD-10-CM | POA: Diagnosis not present

## 2019-05-08 ENCOUNTER — Encounter (HOSPITAL_COMMUNITY): Payer: Medicare HMO

## 2019-05-08 ENCOUNTER — Ambulatory Visit: Payer: Medicare HMO

## 2019-05-15 ENCOUNTER — Ambulatory Visit: Payer: Self-pay | Admitting: Family

## 2019-05-15 ENCOUNTER — Encounter (HOSPITAL_COMMUNITY): Payer: Medicare HMO

## 2019-05-26 ENCOUNTER — Encounter (HOSPITAL_COMMUNITY): Payer: Self-pay | Admitting: Emergency Medicine

## 2019-05-26 ENCOUNTER — Other Ambulatory Visit: Payer: Self-pay

## 2019-05-26 DIAGNOSIS — F17210 Nicotine dependence, cigarettes, uncomplicated: Secondary | ICD-10-CM | POA: Diagnosis not present

## 2019-05-26 DIAGNOSIS — J449 Chronic obstructive pulmonary disease, unspecified: Secondary | ICD-10-CM | POA: Insufficient documentation

## 2019-05-26 DIAGNOSIS — I10 Essential (primary) hypertension: Secondary | ICD-10-CM | POA: Insufficient documentation

## 2019-05-26 DIAGNOSIS — R69 Illness, unspecified: Secondary | ICD-10-CM | POA: Diagnosis not present

## 2019-05-26 DIAGNOSIS — Z79899 Other long term (current) drug therapy: Secondary | ICD-10-CM | POA: Diagnosis not present

## 2019-05-26 DIAGNOSIS — E785 Hyperlipidemia, unspecified: Secondary | ICD-10-CM | POA: Diagnosis not present

## 2019-05-26 DIAGNOSIS — N200 Calculus of kidney: Secondary | ICD-10-CM | POA: Diagnosis not present

## 2019-05-26 DIAGNOSIS — R1010 Upper abdominal pain, unspecified: Secondary | ICD-10-CM | POA: Diagnosis not present

## 2019-05-26 MED ORDER — SODIUM CHLORIDE 0.9% FLUSH
3.0000 mL | Freq: Once | INTRAVENOUS | Status: AC
  Administered 2019-05-27: 3 mL via INTRAVENOUS

## 2019-05-26 NOTE — ED Triage Notes (Signed)
Pt c/o abd pain and swelling that started today. Last bm was yesterday.

## 2019-05-27 ENCOUNTER — Emergency Department (HOSPITAL_COMMUNITY): Payer: Medicare HMO

## 2019-05-27 ENCOUNTER — Emergency Department (HOSPITAL_COMMUNITY)
Admission: EM | Admit: 2019-05-27 | Discharge: 2019-05-27 | Disposition: A | Discharge status: Home / Self Care | Payer: Medicare HMO | Attending: Emergency Medicine | Admitting: Emergency Medicine

## 2019-05-27 ENCOUNTER — Encounter (HOSPITAL_COMMUNITY): Payer: Self-pay

## 2019-05-27 DIAGNOSIS — E785 Hyperlipidemia, unspecified: Secondary | ICD-10-CM | POA: Diagnosis not present

## 2019-05-27 DIAGNOSIS — R69 Illness, unspecified: Secondary | ICD-10-CM | POA: Diagnosis not present

## 2019-05-27 DIAGNOSIS — Z79899 Other long term (current) drug therapy: Secondary | ICD-10-CM | POA: Diagnosis not present

## 2019-05-27 DIAGNOSIS — J449 Chronic obstructive pulmonary disease, unspecified: Secondary | ICD-10-CM | POA: Diagnosis not present

## 2019-05-27 DIAGNOSIS — N200 Calculus of kidney: Secondary | ICD-10-CM | POA: Diagnosis not present

## 2019-05-27 DIAGNOSIS — R1010 Upper abdominal pain, unspecified: Secondary | ICD-10-CM

## 2019-05-27 DIAGNOSIS — I10 Essential (primary) hypertension: Secondary | ICD-10-CM | POA: Diagnosis not present

## 2019-05-27 LAB — COMPREHENSIVE METABOLIC PANEL
ALT: 17 U/L (ref 0–44)
AST: 17 U/L (ref 15–41)
Albumin: 4.3 g/dL (ref 3.5–5.0)
Alkaline Phosphatase: 104 U/L (ref 38–126)
Anion gap: 11 (ref 5–15)
BUN: 12 mg/dL (ref 6–20)
CO2: 23 mmol/L (ref 22–32)
Calcium: 9.1 mg/dL (ref 8.9–10.3)
Chloride: 107 mmol/L (ref 98–111)
Creatinine, Ser: 0.94 mg/dL (ref 0.44–1.00)
GFR calc Af Amer: >60 mL/min (ref >60)
GFR calc non Af Amer: >60 mL/min (ref >60)
Glucose, Bld: 96 mg/dL (ref 70–99)
Potassium: 4.2 mmol/L (ref 3.5–5.1)
Sodium: 141 mmol/L (ref 135–145)
Total Bilirubin: 0.5 mg/dL (ref 0.3–1.2)
Total Protein: 7.3 g/dL (ref 6.5–8.1)

## 2019-05-27 LAB — URINALYSIS, ROUTINE W REFLEX MICROSCOPIC
Bilirubin Urine: NEGATIVE
Glucose, UA: NEGATIVE mg/dL
Hgb urine dipstick: NEGATIVE
Ketones, ur: NEGATIVE mg/dL
Leukocytes,Ua: NEGATIVE
Nitrite: NEGATIVE
Protein, ur: NEGATIVE mg/dL
Specific Gravity, Urine: 1.031 — ABNORMAL HIGH (ref 1.005–1.030)
pH: 5 (ref 5.0–8.0)

## 2019-05-27 LAB — CBC
HCT: 43.9 % (ref 36.0–46.0)
Hemoglobin: 13.6 g/dL (ref 12.0–15.0)
MCH: 27.6 pg (ref 26.0–34.0)
MCHC: 31 g/dL (ref 30.0–36.0)
MCV: 89.2 fL (ref 80.0–100.0)
Platelets: 216 10*3/uL (ref 150–400)
RBC: 4.92 MIL/uL (ref 3.87–5.11)
RDW: 14.5 % (ref 11.5–15.5)
WBC: 9.1 10*3/uL (ref 4.0–10.5)
nRBC: 0 % (ref 0.0–0.2)

## 2019-05-27 LAB — LIPASE, BLOOD: Lipase: 47 U/L (ref 11–51)

## 2019-05-27 MED ORDER — ONDANSETRON HCL 4 MG/2ML IJ SOLN
4.0000 mg | Freq: Once | INTRAMUSCULAR | Status: AC
  Administered 2019-05-27: 4 mg via INTRAVENOUS
  Filled 2019-05-27: qty 2

## 2019-05-27 MED ORDER — IOHEXOL 300 MG/ML  SOLN
100.0000 mL | Freq: Once | INTRAMUSCULAR | Status: AC | PRN
  Administered 2019-05-27: 100 mL via INTRAVENOUS

## 2019-05-27 MED ORDER — MORPHINE SULFATE (PF) 4 MG/ML IV SOLN
4.0000 mg | Freq: Once | INTRAVENOUS | Status: AC
  Administered 2019-05-27: 4 mg via INTRAVENOUS
  Filled 2019-05-27: qty 1

## 2019-05-27 NOTE — ED Provider Notes (Signed)
[facility] EMERGENCY DEPARTMENT Provider Note   CSN: [id number] Arrival date & time: 05/26/19  2229    History   Chief Complaint Chief Complaint  Patient presents with  . Abdominal Pain    HPI Virginia Washington is a 63 y.o. female.     HPI  This is a 63 year old female with a history of chronic back pain, constipation, COPD, hypertension, hyperlipidemia who presents with abdominal pain.  Patient reports onset of abdominal pain and distention last night.  She reports that her symptoms are similar to when she had an obstruction.  She denies any nausea, vomiting.  Reports normal bowel movements.  She rates her pain 8 out of 10.  She has not taken anything for the pain.  The pain is mostly over the upper abdomen and radiates to her back.  Denies urinary symptoms, fever.  Past Medical History:  Diagnosis Date  . Chronic back pain   . Constipation   . Constipation   . COPD (chronic obstructive pulmonary disease) ([place])   . DVT (deep venous thrombosis) ([place])   . GERD (gastroesophageal reflux disease)   . Headache   . History of kidney stones   . Hyperlipidemia   . Hypertension     Patient Active Problem List   Diagnosis Date Noted  . Preop cardiovascular exam 09/23/2018  . PAD (peripheral artery disease) (H[facility]) -left iliac occlusion 09/23/2018  . Esophageal dysphagia   . SBO (small bowel obstruction) ([place]) 04/29/2018  . GERD (gastroesophageal reflux disease) 04/28/2018  . Hypokalemia 04/28/2018  . Pulmonary nodule 04/28/2018  . Hypernatremia 04/28/2018  . Enteritis 04/28/2018  . Encounter for screening colonoscopy 01/16/2018  . Abdominal pain 11/16/2017  . Constipation 11/16/2017  . Partial small bowel obstruction ([place]) 08/16/2016  . Hyperlipidemia 08/16/2016  . Chest pain 12/12/2014  . Hypertension 12/12/2014  . Tobacco abuse 10/29/2011    Past Surgical History:  Procedure Laterality Date  . ABDOMINAL AORTOGRAM W/LOWER EXTREMITY N/A 10/26/2017   Procedure:  ABDOMINAL AORTOGRAM W/LOWER EXTREMITY;  Surgeon: Elam Dutch, MD;  Location: [place] [facility];  Service: Cardiovascular: 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  Normal right aortoiliac vessels.  Three-vessel runoff bilaterally.  (Sluggish flow of the left side due to occlusion.  . ABDOMINAL HYSTERECTOMY    . CHOLECYSTECTOMY    . COLONOSCOPY  2007   Dr. Laural Golden: small internal hemorrhoids, otherwise normal. exam could be compromised due to quality of prep  . COLONOSCOPY N/A 02/15/2018   Dr. Oneida Alar: 2 simple adenomas, external and internal hemorrhoids. Next colonoscopy 2024-2026  . ESOPHAGOGASTRODUODENOSCOPY N/A 05/09/2018   Procedure: ESOPHAGOGASTRODUODENOSCOPY (EGD);  Surgeon: Danie Binder, MD;  [facility];  Service: Endoscopy;  Laterality: N/A;  2:15pm- Possible Pyloric Dilation  . FEMORAL-FEMORAL BYPASS GRAFT Bilateral 10/21/2018   Procedure: RIGHT TO LEFT FEMORAL ARTERY BYPASS GRAFT;  Surgeon: Elam Dutch, MD;  Location: [place];  Service: Vascular;  Laterality: Bilateral;  . LOWER EXTREMITY ANGIOGRAPHY N/A 09/27/2018   Procedure: LOWER EXTREMITY ANGIOGRAPHY;  Surgeon: Elam Dutch, MD;  Location: [place] [facility];  Service: Cardiovascular;  Laterality: N/A;  . NM MYOVIEW LTD  12/2014   [facility]: Normal Myoview.  Nonischemic.  Marland Kitchen TRANSTHORACIC ECHOCARDIOGRAM  12/2011   EF 55 to 60%.  No regional wall motion normality.  GRII DD.  No significant valve disease.     OB History    Gravida      Para  Term      Preterm      AB      Living  3     SAB      TAB      Ectopic      Multiple      Live Births               Home Medications    Prior to Admission medications   Medication Sig Start Date End Date Taking? Authorizing Provider  amLODipine (NORVASC) 5 MG tablet Take 5 mg by mouth daily.    [provider]  aspirin EC 325 MG tablet Take 1 tablet  (325 mg total) by mouth daily. 09/27/18 09/27/19  Elam Dutch, MD  atorvastatin (LIPITOR) 20 MG tablet Take 1 tablet (20 mg total) by mouth daily. 12/23/14   Fay Records, MD  doxycycline (VIBRAMYCIN) 100 MG capsule Take 1 capsule (100 mg total) by mouth 2 (two) times daily. 02/08/19   Daleen Bo, MD  linaclotide [facility]) 290 MCG CAPS capsule Take 290 mcg by mouth at bedtime.    [provider]  oxyCODONE (OXY IR/ROXICODONE) 5 MG immediate release tablet Take 1 tablet (5 mg total) by mouth every 6 (six) hours as needed for moderate pain. Patient not taking: Reported on 11/07/2018 10/23/18   Dagoberto Ligas, PA-C    Family History Family History  Problem Relation Age of Onset  . Hypertension Mother   . Peripheral Artery Disease Brother   . Colon cancer Neg Hx   . Colon polyps Neg Hx     Social History Social History   Tobacco Use  . Smoking status: Current Every Day Smoker    Packs/day: 0.50    Years: 40.00    Pack years: 20.00    Types: Cigarettes  . Smokeless tobacco: Never Used  Substance Use Topics  . Alcohol use: No    Alcohol/week: 0.0 standard drinks  . Drug use: No     Allergies   Patient has no known allergies.   Review of Systems Review of Systems  Constitutional: Negative for fever.  Respiratory: Negative for shortness of breath.   Cardiovascular: Negative for chest pain.  Gastrointestinal: Positive for abdominal distention and abdominal pain. Negative for constipation, diarrhea, nausea and vomiting.  Genitourinary: Negative for dysuria.  Musculoskeletal: Negative for back pain.  All other systems reviewed and are negative.    Physical Exam Updated Vital Signs BP 131/72 (BP Location: Left Arm)   Pulse 87   Temp 97.9 F (36.6 C)   Resp 17   Ht 1.575 m (5\' 2" )   Wt 61.2 kg   SpO2 94%   BMI 24.69 kg/m   Physical Exam Vitals signs and nursing note reviewed.  Constitutional:      Appearance: She is well-developed. She is not  ill-appearing.  HENT:     Head: Normocephalic and atraumatic.  Eyes:     Pupils: Pupils are equal, round, and reactive to light.  Neck:     Musculoskeletal: Neck supple.  Cardiovascular:     Rate and Rhythm: Normal rate and regular rhythm.     Heart sounds: Normal heart sounds.  Pulmonary:     Effort: Pulmonary effort is normal. No respiratory distress.     Breath sounds: No wheezing.  Abdominal:     General: Bowel sounds are decreased. There is distension.     Palpations: Abdomen is soft.     Tenderness: There is generalized abdominal tenderness. There is no  guarding or rebound.     Hernia: No hernia is present.     Comments: Extensive abdominal scarring noted  Skin:    General: Skin is warm and dry.  Neurological:     Mental Status: She is alert and oriented to person, place, and time.  Psychiatric:        Mood and Affect: Mood normal.      ED Treatments / Results  Labs (all labs ordered are listed, but only abnormal results are displayed) Labs Reviewed  URINALYSIS, ROUTINE W REFLEX MICROSCOPIC - Abnormal; Notable for the following components:      Result Value   Color, Urine STRAW (*)    Specific Gravity, Urine 1.031 (*)    All other components within normal limits  LIPASE, BLOOD  COMPREHENSIVE METABOLIC PANEL  CBC    EKG None  Radiology Ct Abdomen Pelvis W Contrast  Result Date: 05/27/2019 CLINICAL DATA:  Upper abdominal pain. EXAM: CT ABDOMEN AND PELVIS WITH CONTRAST TECHNIQUE: Multidetector CT imaging of the abdomen and pelvis was performed using the standard protocol following bolus administration of intravenous contrast. CONTRAST:  132mL OMNIPAQUE IOHEXOL 300 MG/ML  SOLN COMPARISON:  04/27/2018. FINDINGS: Lower chest: No acute abnormality. Hepatobiliary: No focal liver abnormality is seen. Status post cholecystectomy. No biliary dilatation. Pancreas: Unremarkable. No pancreatic ductal dilatation or surrounding inflammatory changes. 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 Spleen: Normal in size without focal abnormality. Adrenals/Urinary Tract: There is a punctate nonobstructing stone in the interpolar region of the right kidney. There is no hydronephrosis. The urinary bladder is unremarkable. Stomach/Bowel: There is an air-fluid level at the level of the gastric antrum without evidence of an obstructing lesion. The remaining portions of the stomach are unremarkable. The colon is unremarkable. The appendix is normal. There is no evidence of a small-bowel obstruction. There are few fluid-filled loops of small bowel in the midline high abdomen. Vascular/Lymphatic: Aortic atherosclerosis. No enlarged abdominal or pelvic lymph nodes. There is complete occlusion of the left common iliac artery. There is a patent fem-fem bypass graft. Reproductive: Status post hysterectomy. No adnexal masses. Other: No abdominal wall hernia or abnormality. No abdominopelvic ascites. Musculoskeletal: No acute or significant osseous findings. IMPRESSION: 1. No acute abnormality detected. 2. Punctate nonobstructing stone in the interpolar region of the right kidney. 3. Additional chronic findings as above. Electronically Signed   By: Constance Holster M.D.   On: 05/27/2019 02:09    Procedures Procedures (including critical care time)  Medications Ordered in ED Medications  sodium chloride flush (NS) 0.9 % injection 3 mL (3 mLs Intravenous Given 05/27/19 0130)  morphine 4 MG/ML injection 4 mg (4 mg Intravenous Given 05/27/19 0130)  ondansetron (ZOFRAN) injection 4 mg (4 mg Intravenous Given 05/27/19 0129)  iohexol (OMNIPAQUE) 300 MG/ML solution 100 mL (100 mLs Intravenous Contrast Given 05/27/19 0144)     Initial Impression / Assessment and Plan / ED Course  I have reviewed the triage vital signs and the nursing notes.  Pertinent labs & imaging results that were available during my care of the patient were reviewed by me and considered in my medical decision making (see chart for  details).        Presents with abdominal pain.  She is overall nontoxic-appearing and vital signs are reassuring.  She does have some distention and mild tenderness to palpation.  No signs of peritonitis.  Patient was given pain and nausea medication.  Lab work obtained and reviewed.  Lab work-up is largely unremarkable.  Given history of multiple abdominal surgeries and SBO's in the past, CT scan was obtained to rule out SBO or other intra-abdominal pathology.  CT scan is largely reassuring.  On recheck, patient is resting comfortably.  She has been able to tolerate fluids.  Unclear etiology of her pain; however, at this time her work-up is reassuring.  Will discharge home with symptom control.  After history, exam, and medical workup I feel the patient has been appropriately medically screened and is safe for discharge home. Pertinent diagnoses were discussed with the patient. Patient was given return precautions.   Final Clinical Impressions(s) / ED Diagnoses   Final diagnoses:  Pain of upper abdomen    ED Discharge Orders    None       Eltha Tingley, Barbette Hair, MD 05/27/19 [phone]

## 2019-05-27 NOTE — ED Notes (Signed)
Pt ambulated to bathroom with no assistance, pt has steady gait

## 2019-05-27 NOTE — Discharge Instructions (Addendum)
You were seen today for abdominal pain.  Your work-up is largely reassuring.  Follow-up closely with your primary physician.  If you develop nausea, vomiting, worsening pain, inability to have a bowel movement you need to be reevaluated.

## 2019-05-27 NOTE — ED Notes (Signed)
Ginger Ale given

## 2019-05-30 ENCOUNTER — Observation Stay (HOSPITAL_COMMUNITY)
Admission: EM | Admit: 2019-05-30 | Discharge: 2019-06-02 | Disposition: A | Discharge status: Home / Self Care | Payer: Medicare HMO | Attending: Internal Medicine | Admitting: Internal Medicine

## 2019-05-30 ENCOUNTER — Encounter (HOSPITAL_COMMUNITY): Payer: Self-pay

## 2019-05-30 ENCOUNTER — Emergency Department (HOSPITAL_COMMUNITY): Payer: Medicare HMO

## 2019-05-30 ENCOUNTER — Other Ambulatory Visit: Payer: Self-pay

## 2019-05-30 DIAGNOSIS — Z720 Tobacco use: Secondary | ICD-10-CM | POA: Diagnosis present

## 2019-05-30 DIAGNOSIS — F17210 Nicotine dependence, cigarettes, uncomplicated: Secondary | ICD-10-CM | POA: Insufficient documentation

## 2019-05-30 DIAGNOSIS — K219 Gastro-esophageal reflux disease without esophagitis: Secondary | ICD-10-CM | POA: Diagnosis present

## 2019-05-30 DIAGNOSIS — Z7982 Long term (current) use of aspirin: Secondary | ICD-10-CM | POA: Diagnosis not present

## 2019-05-30 DIAGNOSIS — K56609 Unspecified intestinal obstruction, unspecified as to partial versus complete obstruction: Secondary | ICD-10-CM

## 2019-05-30 DIAGNOSIS — J449 Chronic obstructive pulmonary disease, unspecified: Secondary | ICD-10-CM | POA: Insufficient documentation

## 2019-05-30 DIAGNOSIS — R109 Unspecified abdominal pain: Secondary | ICD-10-CM | POA: Diagnosis present

## 2019-05-30 DIAGNOSIS — Z03818 Encounter for observation for suspected exposure to other biological agents ruled out: Secondary | ICD-10-CM | POA: Diagnosis not present

## 2019-05-30 DIAGNOSIS — Z20828 Contact with and (suspected) exposure to other viral communicable diseases: Secondary | ICD-10-CM | POA: Insufficient documentation

## 2019-05-30 DIAGNOSIS — Z9049 Acquired absence of other specified parts of digestive tract: Secondary | ICD-10-CM | POA: Diagnosis not present

## 2019-05-30 DIAGNOSIS — K56600 Partial intestinal obstruction, unspecified as to cause: Principal | ICD-10-CM | POA: Insufficient documentation

## 2019-05-30 DIAGNOSIS — E785 Hyperlipidemia, unspecified: Secondary | ICD-10-CM | POA: Diagnosis present

## 2019-05-30 DIAGNOSIS — R1010 Upper abdominal pain, unspecified: Secondary | ICD-10-CM | POA: Diagnosis present

## 2019-05-30 DIAGNOSIS — I10 Essential (primary) hypertension: Secondary | ICD-10-CM | POA: Diagnosis present

## 2019-05-30 DIAGNOSIS — K5900 Constipation, unspecified: Secondary | ICD-10-CM | POA: Diagnosis present

## 2019-05-30 DIAGNOSIS — Z79899 Other long term (current) drug therapy: Secondary | ICD-10-CM | POA: Diagnosis not present

## 2019-05-30 DIAGNOSIS — R69 Illness, unspecified: Secondary | ICD-10-CM | POA: Diagnosis not present

## 2019-05-30 DIAGNOSIS — I739 Peripheral vascular disease, unspecified: Secondary | ICD-10-CM | POA: Diagnosis present

## 2019-05-30 LAB — CBC WITH DIFFERENTIAL/PLATELET
Abs Immature Granulocytes: 0.01 10*3/uL (ref 0.00–0.07)
Basophils Absolute: 0.1 10*3/uL (ref 0.0–0.1)
Basophils Relative: 1 %
Eosinophils Absolute: 0.2 10*3/uL (ref 0.0–0.5)
Eosinophils Relative: 2 %
HCT: 42.6 % (ref 36.0–46.0)
Hemoglobin: 13.3 g/dL (ref 12.0–15.0)
Immature Granulocytes: 0 %
Lymphocytes Relative: 56 %
Lymphs Abs: 4.2 10*3/uL — ABNORMAL HIGH (ref 0.7–4.0)
MCH: 27.5 pg (ref 26.0–34.0)
MCHC: 31.2 g/dL (ref 30.0–36.0)
MCV: 88.2 fL (ref 80.0–100.0)
Monocytes Absolute: 0.6 10*3/uL (ref 0.1–1.0)
Monocytes Relative: 8 %
Neutro Abs: 2.5 10*3/uL (ref 1.7–7.7)
Neutrophils Relative %: 33 %
Platelets: 216 10*3/uL (ref 150–400)
RBC: 4.83 MIL/uL (ref 3.87–5.11)
RDW: 14.6 % (ref 11.5–15.5)
WBC: 7.5 10*3/uL (ref 4.0–10.5)
nRBC: 0 % (ref 0.0–0.2)

## 2019-05-30 LAB — COMPREHENSIVE METABOLIC PANEL
ALT: 13 U/L (ref 0–44)
AST: 13 U/L — ABNORMAL LOW (ref 15–41)
Albumin: 4 g/dL (ref 3.5–5.0)
Alkaline Phosphatase: 103 U/L (ref 38–126)
Anion gap: 7 (ref 5–15)
BUN: 13 mg/dL (ref 6–20)
CO2: 23 mmol/L (ref 22–32)
Calcium: 8.9 mg/dL (ref 8.9–10.3)
Chloride: 109 mmol/L (ref 98–111)
Creatinine, Ser: 0.86 mg/dL (ref 0.44–1.00)
GFR calc Af Amer: >60 mL/min (ref >60)
GFR calc non Af Amer: >60 mL/min (ref >60)
Glucose, Bld: 104 mg/dL — ABNORMAL HIGH (ref 70–99)
Potassium: 3.7 mmol/L (ref 3.5–5.1)
Sodium: 139 mmol/L (ref 135–145)
Total Bilirubin: 0.5 mg/dL (ref 0.3–1.2)
Total Protein: 6.8 g/dL (ref 6.5–8.1)

## 2019-05-30 LAB — PHOSPHORUS: Phosphorus: 4.2 mg/dL (ref 2.5–4.6)

## 2019-05-30 LAB — MAGNESIUM: Magnesium: 2.2 mg/dL (ref 1.7–2.4)

## 2019-05-30 LAB — SARS CORONAVIRUS 2 BY RT PCR (HOSPITAL ORDER, PERFORMED IN ~~LOC~~ HOSPITAL LAB): SARS Coronavirus 2: NEGATIVE

## 2019-05-30 LAB — LIPASE, BLOOD: Lipase: 65 U/L — ABNORMAL HIGH (ref 11–51)

## 2019-05-30 MED ORDER — MORPHINE SULFATE (PF) 4 MG/ML IV SOLN
3.0000 mg | INTRAVENOUS | Status: DC | PRN
  Administered 2019-05-30 – 2019-05-31 (×3): 3 mg via INTRAVENOUS
  Filled 2019-05-30 (×3): qty 1

## 2019-05-30 MED ORDER — MAGNESIUM SULFATE 2 GM/50ML IV SOLN
2.0000 g | Freq: Once | INTRAVENOUS | Status: AC
  Administered 2019-05-30: 2 g via INTRAVENOUS
  Filled 2019-05-30: qty 50

## 2019-05-30 MED ORDER — ONDANSETRON HCL 4 MG/2ML IJ SOLN
4.0000 mg | Freq: Once | INTRAMUSCULAR | Status: AC
  Administered 2019-05-30: 4 mg via INTRAVENOUS
  Filled 2019-05-30: qty 2

## 2019-05-30 MED ORDER — ACETAMINOPHEN 325 MG PO TABS: 650.0000 mg | ORAL_TABLET | Freq: Four times a day (QID) | ORAL | Status: DC | PRN

## 2019-05-30 MED ORDER — MORPHINE SULFATE (PF) 4 MG/ML IV SOLN
4.0000 mg | Freq: Once | INTRAVENOUS | Status: AC
  Administered 2019-05-30: 04:00:00 4 mg via INTRAVENOUS
  Filled 2019-05-30: qty 1

## 2019-05-30 MED ORDER — POTASSIUM CHLORIDE IN NACL 20-0.9 MEQ/L-% IV SOLN
INTRAVENOUS | Status: DC
  Administered 2019-05-30 – 2019-05-31 (×5): via INTRAVENOUS
  Administered 2019-06-01: 100 mL/h via INTRAVENOUS
  Administered 2019-06-01: 22:00:00 via INTRAVENOUS
  Filled 2019-05-30: qty 1000

## 2019-05-30 MED ORDER — IOHEXOL 300 MG/ML  SOLN
100.0000 mL | Freq: Once | INTRAMUSCULAR | Status: AC | PRN
  Administered 2019-05-30: 100 mL via INTRAVENOUS

## 2019-05-30 MED ORDER — SODIUM CHLORIDE 0.9 % IV BOLUS
1000.0000 mL | Freq: Once | INTRAVENOUS | Status: AC
  Administered 2019-05-30: 1000 mL via INTRAVENOUS

## 2019-05-30 MED ORDER — ACETAMINOPHEN 650 MG RE SUPP: 650.0000 mg | Freq: Four times a day (QID) | RECTAL | Status: DC | PRN

## 2019-05-30 NOTE — Progress Notes (Signed)
Patient seen and examined.  Admitted after midnight secondary to abdominal pain, nausea and intermittent episode of vomiting.  Symptom has been present for the last 3-4 days and worsening.  Patient is hemodynamically stable, denies any nausea or vomiting currently and reporting no abdominal pain.  Please refer to H&P written by Dr. Olevia Bowens on 05/30/2019 for further info/details on admission.  Plan: -Continue IV fluids, as needed antiemetics and analgesics -Follow electrolytes and replete as needed -Continue n.p.o. -Will monitor closely and if needed place NG tube -general surgery recommendations.  Barton Dubois MD [phone]

## 2019-05-30 NOTE — Care Management Obs Status (Signed)
[facility] NOTIFICATION   Patient Details  Name: Virginia Washington MRN: [id number] Date of Birth: Jun 25, 1956   Medicare Observation Status Notification Given:  Yes    Tommy Medal 05/30/2019, 2:27 PM

## 2019-05-30 NOTE — ED Triage Notes (Signed)
Pt reports abd pain and bloating just the same as on the 9th.  Pt states she has an appt with her doctor next week.  No vomiting or diarrhea.

## 2019-05-30 NOTE — H&P (Signed)
History and Physical    Virginia Washington QQV:[id number] DOB: 04-Jul-1956 DOA: 05/30/2019  PCP: [name], MD   Patient coming from: Home.  I have personally briefly reviewed patient's old medical records in [place]  Chief Complaint: Abdominal pain.  HPI: Virginia Washington is a 63 y.o. female with medical history significant of chronic back pain, COPD, history of DVT, headaches, urolithiasis, hyperlipidemia, hypertension, GERD, chronic constipation, history of SBO, history of partial SBO who is returning to the emergency department for the second time in the past 3 days due to persistent abdominal pain.  She has experienced more distention, has had some nausea, but denies emesis.  She denies fever, diarrhea, melena or hematochezia.  She denies flank pain, dysuria, frequency or hematuria.  Denies chest pain, palpitations, diaphoresis, PND, orthopnea or pitting edema of the lower extremities.  No dysuria, frequency or hematuria.  The patient states that the pain and overall symptoms feel similar to when she has bowel obstruction.  ED Course: Initial vital signs temperature 97.6 F, pulse 79, respiration 19, blood pressure 150/82 mmHg and O2 sat 98% on room air.  The patient received a 1000 mL NS bolus, morphine sulfate 4 mg IVP and ondansetron 4 mg IVP.  Patient states that her symptoms are better.  Her CBC was normal.  CMP shows a glucose of 104 mg/dL, and is otherwise unremarkable.  Lipase was 65.  CT abdomen/pelvis with contrast show probable developing small bowel obstruction.  There is focal dilated loop of small bowel with distal collapse.  Likely secondary to adhesions in the ventral abdomen.  Review of Systems: As per HPI otherwise 10 point review of systems negative.   Past Medical History:  Diagnosis Date  . Chronic back pain   . Constipation   . Constipation   . COPD (chronic obstructive pulmonary disease) ([place])   . DVT (deep venous thrombosis) ([place])   . GERD  (gastroesophageal reflux disease)   . Headache   . History of kidney stones   . Hyperlipidemia   . Hypertension     Past Surgical History:  Procedure Laterality Date  . 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  Normal right aortoiliac vessels.  Three-vessel runoff bilaterally.  (Sluggish flow of the left side due to occlusion.  . ABDOMINAL HYSTERECTOMY    . CHOLECYSTECTOMY    . COLONOSCOPY  2007   Dr. Laural Golden: small internal hemorrhoids, otherwise normal. exam could be compromised due to quality of prep  . COLONOSCOPY N/A 02/15/2018   Dr. Oneida Alar: 2 simple adenomas, external and internal hemorrhoids. Next colonoscopy 2024-2026  . ESOPHAGOGASTRODUODENOSCOPY N/A 05/09/2018   Procedure: ESOPHAGOGASTRODUODENOSCOPY (EGD);  Surgeon: Danie Binder, MD;  [facility];  Service: Endoscopy;  Laterality: N/A;  2:15pm- Possible Pyloric Dilation  . FEMORAL-FEMORAL BYPASS GRAFT Bilateral 10/21/2018   Procedure: RIGHT TO LEFT FEMORAL ARTERY BYPASS GRAFT;  Surgeon: Elam Dutch, MD;  Location: [facility];  Service: Vascular;  Laterality: Bilateral;  . LOWER EXTREMITY ANGIOGRAPHY N/A 09/27/2018   Procedure: LOWER EXTREMITY ANGIOGRAPHY;  Surgeon: Elam Dutch, MD;  Location: [place] [facility];  Service: Cardiovascular;  Laterality: N/A;  . NM MYOVIEW LTD  12/2014   [facility]: Normal Myoview.  Nonischemic.  Marland Kitchen TRANSTHORACIC ECHOCARDIOGRAM  12/2011  EF 55 to 60%.  No regional wall motion normality.  GRII DD.  No significant valve disease.     reports that she has been smoking cigarettes. She has a 20.00 pack-year smoking history. She has never used smokeless tobacco. She reports that she does not drink alcohol or use drugs.  No  Known Allergies  Family History  Problem Relation Age of Onset  . Hypertension Mother   . Peripheral Artery Disease Brother   . Colon cancer Neg Hx   . Colon polyps Neg Hx    Prior to Admission medications   Medication Sig Start Date End Date Taking? Authorizing Provider  amLODipine (NORVASC) 5 MG tablet Take 5 mg by mouth daily.    [provider]  aspirin EC 325 MG tablet Take 1 tablet (325 mg total) by mouth daily. 09/27/18 09/27/19  Elam Dutch, MD  atorvastatin (LIPITOR) 20 MG tablet Take 1 tablet (20 mg total) by mouth daily. 12/23/14   Fay Records, MD  doxycycline (VIBRAMYCIN) 100 MG capsule Take 1 capsule (100 mg total) by mouth 2 (two) times daily. 02/08/19   Daleen Bo, MD  linaclotide [facility]) 290 MCG CAPS capsule Take 290 mcg by mouth at bedtime.    [provider]  oxyCODONE (OXY IR/ROXICODONE) 5 MG immediate release tablet Take 1 tablet (5 mg total) by mouth every 6 (six) hours as needed for moderate pain. Patient not taking: Reported on 11/07/2018 10/23/18   Dagoberto Ligas, PA-C    Physical Exam: Vitals:   05/30/19 0327 05/30/19 0328 05/30/19 0430  BP:  (!) 150/82 (!) 145/81  Pulse:  79 (!) 57  Resp:  19   Temp:  97.6 F (36.4 C)   TempSrc:  Oral   SpO2:  98% 96%  Weight: 59 kg    Height: 5\' 2"  (1.575 m)      Constitutional: NAD, calm, comfortable Eyes: PERRL, lids and conjunctivae normal ENMT: Mucous membranes are moist. Posterior pharynx clear of any exudate or lesions. Neck: normal, supple, no masses, no thyromegaly Respiratory: clear to auscultation bilaterally, no wheezing, no crackles. Normal respiratory effort. No accessory muscle use.  Cardiovascular: Regular rate and rhythm, no murmurs / rubs / gallops. No extremity edema. 2+ pedal pulses. No carotid bruits.  Abdomen: Distended, mild diffuse tenderness, no guarding or rebound, masses palpated. No hepatosplenomegaly. Bowel sounds positive.  Musculoskeletal: no clubbing /  cyanosis. Good ROM, no contractures. Normal muscle tone.  Skin: no rashes, lesions, ulcers. No induration Neurologic: CN 2-12 grossly intact. Sensation intact, DTR normal. Strength 5/5 in all 4.  Psychiatric: Normal judgment and insight. Alert and oriented x 3. Normal mood.   Labs on Admission: I have personally reviewed following labs and imaging studies  CBC: Recent Labs  Lab 05/26/19 2349 05/30/19 0342  WBC 9.1 7.5  NEUTROABS  --  2.5  HGB 13.6 13.3  HCT 43.9 42.6  MCV 89.2 88.2  PLT 216 378   Basic Metabolic Panel: Recent Labs  Lab 05/26/19 2349 05/30/19 0342  NA 141 139  K 4.2 3.7  CL 107 109  CO2 23 23  GLUCOSE 96 104*  BUN 12 13  CREATININE 0.94 0.86  CALCIUM 9.1 8.9   GFR: Estimated Creatinine Clearance: 55 mL/min (by C-G formula based on SCr of 0.86 mg/dL). Liver Function Tests: Recent Labs  Lab 05/26/19 2349 05/30/19 0342  AST 17 13*  ALT 17 13  ALKPHOS 104 103  BILITOT 0.5 0.5  PROT 7.3 6.8  ALBUMIN 4.3  4.0   Recent Labs  Lab 05/26/19 2349 05/30/19 0342  LIPASE 47 65*   No results for input(s): AMMONIA in the last 168 hours. Coagulation Profile: No results for input(s): INR, PROTIME in the last 168 hours. Cardiac Enzymes: No results for input(s): CKTOTAL, CKMB, CKMBINDEX, TROPONINI in the last 168 hours. BNP (last 3 results) No results for input(s): PROBNP in the last 8760 hours. HbA1C: No results for input(s): HGBA1C in the last 72 hours. CBG: No results for input(s): GLUCAP in the last 168 hours. Lipid Profile: No results for input(s): CHOL, HDL, LDLCALC, TRIG, CHOLHDL, LDLDIRECT in the last 72 hours. Thyroid Function Tests: No results for input(s): TSH, T4TOTAL, FREET4, T3FREE, THYROIDAB in the last 72 hours. Anemia Panel: No results for input(s): VITAMINB12, FOLATE, FERRITIN, TIBC, IRON, RETICCTPCT in the last 72 hours. Urine analysis:    Component Value Date/Time   COLORURINE STRAW (A) 05/27/2019 0249   APPEARANCEUR CLEAR  05/27/2019 0249   LABSPEC 1.031 (H) 05/27/2019 0249   PHURINE 5.0 05/27/2019 0249   GLUCOSEU NEGATIVE 05/27/2019 0249   HGBUR NEGATIVE 05/27/2019 0249   BILIRUBINUR NEGATIVE 05/27/2019 0249   KETONESUR NEGATIVE 05/27/2019 0249   PROTEINUR NEGATIVE 05/27/2019 0249   UROBILINOGEN 0.2 09/19/2015 0928   NITRITE NEGATIVE 05/27/2019 0249   LEUKOCYTESUR NEGATIVE 05/27/2019 0249    Radiological Exams on Admission: Ct Abdomen Pelvis W Contrast  Result Date: 05/30/2019 CLINICAL DATA:  Abdominal pain, acute, generalized. EXAM: CT ABDOMEN AND PELVIS WITH CONTRAST TECHNIQUE: Multidetector CT imaging of the abdomen and pelvis was performed using the standard protocol following bolus administration of intravenous contrast. CONTRAST:  162mL OMNIPAQUE IOHEXOL 300 MG/ML  SOLN COMPARISON:  CT of the abdomen and pelvis 05/27/2019 and 04/27/2018 FINDINGS: Lower chest: Lung bases demonstrate mild dependent atelectasis. Heart size is within normal limits. No significant pleural or pericardial effusion is present. Hepatobiliary: Liver is within normal limits. Mild intra and extrahepatic biliary dilation is stable following cholecystectomy. Pancreas: Mild prominence the pancreatic duct is stable. No obstructing lesion is present. No parenchymal mass is present. Spleen: Normal in size without focal abnormality. Adrenals/Urinary Tract: Adrenal glands are normal bilaterally. Punctate nonobstructing stone in the midportion of the right kidney is stable. No other significant stone or mass lesion is present. There is no obstruction. The urinary bladder is within normal limits. Stomach/Bowel: Stomach and duodenum are within normal limits. Small bowel adhesions are present. There is increased distension of essentially single loop of small bowel in the mid abdomen measuring 2.8 cm maximally. More distal small bowel is collapsed. Terminal ileum is unremarkable. Appendix is visualized and normal. The ascending and transverse colon are  within normal limits. Descending and sigmoid colon are mostly collapsed. Vascular/Lymphatic: Extensive atherosclerotic calcifications and irregularity are noted in the aorta and branch vessels. 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 Fem-fem bypass graft is patent. Retroperitoneal node dissection is again noted. No significant retroperitoneal adenopathy is present. Reproductive: Status post hysterectomy. No adnexal masses. Other: No abdominal wall hernia or abnormality. No abdominopelvic ascites. Musculoskeletal: Vertebral body heights and alignment are maintained. No focal lytic or blastic lesions are present. Bony pelvis is within normal limits. The hips are located and within normal limits. IMPRESSION: 1. Probable developing small-bowel obstruction. Focal dilated loop of small bowel with distal collapse. This is likely secondary to adhesions the ventral abdomen. 2.  Aortic Atherosclerosis (ICD10-I70.0). Electronically Signed   By: San Morelle M.D.   On: 05/30/2019 05:37    EKG: Independently reviewed.  Waiting for tracing to be performed.  Assessment/Plan Principal Problem:   Intractable abdominal pain   Partial small bowel obstruction (HCC) Observation/telemetry. Keep n.p.o. Continue IV fluids. Analgesics as needed. Antiemetics as needed. Protonix IV. Consult general surgery. Place NGT if emesis occurs or no improvement.  Active Problems:   Tobacco abuse Nicotine replacement therapy as needed ordered. I advised the patient to let us know if she feels withdrawal symptoms.    Hypertension Resume amlodipine n.p.o. status discontinued.    Hyperlipidemia Resume atorvastatin once clear for diet.    Constipation Resume Linzess once clear for oral intake.    GERD (gastroesophageal reflux disease) Continue parenteral PPI.    PAD (peripheral artery disease) (HCC) -left iliac occlusion Smoking cessation advised.   DVT prophylaxis: SCDs. Code Status:  Full code. Family Communication: Disposition Plan: Observation for IV hydration and symptoms management. Consults called: Routine general surgery consult. Admission status: Observation/telemetry.   Reubin Milan MD [facility]  05/30/2019, 6:02 AM

## 2019-05-30 NOTE — ED Provider Notes (Signed)
[facility] EMERGENCY DEPARTMENT Provider Note   CSN: [id number] Arrival date & time: 05/30/19  4268     History   Chief Complaint Chief Complaint  Patient presents with  . Abdominal Pain    HPI Virginia Washington is a 63 y.o. female.     Patient is a 63 year old female with past medical history of COPD, hypertension, hyperlipidemia, prior cholecystectomy, and prior hysterectomy.  She presents today for evaluation of abdominal pain.  She describes severe pain across the upper abdomen along with abdominal distention.  This is worsened over the past several days.  She denies vomiting.  She denies any bowel or bladder complaints.  Patient was seen here 2 days ago with a similar presentation.  She had a CT scan and laboratory studies, all of which were unremarkable and reassuring.  Patient was discharged, but her pain has since returned.  The history is provided by the patient.  Abdominal Pain Pain location:  Epigastric Pain quality: cramping   Pain radiates to:  Does not radiate Pain severity:  Severe Duration:  3 days Timing:  Constant Progression:  Worsening   Past Medical History:  Diagnosis Date  . Chronic back pain   . Constipation   . Constipation   . COPD (chronic obstructive pulmonary disease) ([place])   . DVT (deep venous thrombosis) ([place])   . GERD (gastroesophageal reflux disease)   . Headache   . History of kidney stones   . Hyperlipidemia   . Hypertension     Patient Active Problem List   Diagnosis Date Noted  . Preop cardiovascular exam 09/23/2018  . PAD (peripheral artery disease) (HCC) -left iliac occlusion 09/23/2018  . Esophageal dysphagia   . SBO (small bowel obstruction) ([place]) 04/29/2018  . GERD (gastroesophageal reflux disease) 04/28/2018  . Hypokalemia 04/28/2018  . Pulmonary nodule 04/28/2018  . Hypernatremia 04/28/2018  . Enteritis 04/28/2018  . Encounter for screening colonoscopy 01/16/2018  . Abdominal pain 11/16/2017  . Constipation  11/16/2017  . Partial small bowel obstruction ([place]) 08/16/2016  . Hyperlipidemia 08/16/2016  . Chest pain 12/12/2014  . Hypertension 12/12/2014  . Tobacco abuse 10/29/2011    Past Surgical History:  Procedure Laterality Date  . ABDOMINAL AORTOGRAM W/LOWER EXTREMITY N/A 10/26/2017   Procedure: ABDOMINAL AORTOGRAM W/LOWER EXTREMITY;  Surgeon: Elam Dutch, MD;  Location: [place] [facility];  Service: Cardiovascular: 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  Normal right aortoiliac vessels.  Three-vessel runoff bilaterally.  (Sluggish flow of the left side due to occlusion.  . ABDOMINAL HYSTERECTOMY    . CHOLECYSTECTOMY    . COLONOSCOPY  2007   Dr. Laural Golden: small internal hemorrhoids, otherwise normal. exam could be compromised due to quality of prep  . COLONOSCOPY N/A 02/15/2018   Dr. Oneida Alar: 2 simple adenomas, external and internal hemorrhoids. Next colonoscopy 2024-2026  . ESOPHAGOGASTRODUODENOSCOPY N/A 05/09/2018   Procedure: ESOPHAGOGASTRODUODENOSCOPY (EGD);  Surgeon: Danie Binder, MD;  [facility];  Service: Endoscopy;  Laterality: N/A;  2:15pm- Possible Pyloric Dilation  . FEMORAL-FEMORAL BYPASS GRAFT Bilateral 10/21/2018   Procedure: RIGHT TO LEFT FEMORAL ARTERY BYPASS GRAFT;  Surgeon: Elam Dutch, MD;  Location: [place];  Service: Vascular;  Laterality: Bilateral;  . LOWER EXTREMITY ANGIOGRAPHY N/A 09/27/2018   Procedure: LOWER EXTREMITY ANGIOGRAPHY;  Surgeon: Elam Dutch, MD;  Location: [place] [facility];  Service: Cardiovascular;  Laterality: N/A;  . NM MYOVIEW LTD  12/2014   [facility]:  Normal Myoview.  Nonischemic.  Marland Kitchen TRANSTHORACIC ECHOCARDIOGRAM  12/2011   EF 55 to 60%.  No regional wall motion normality.  GRII DD.  No significant valve disease.     OB History    Gravida      Para      Term      Preterm      AB      Living  3     SAB      TAB      Ectopic       Multiple      Live Births               Home Medications    Prior to Admission medications   Medication Sig Start Date End Date Taking? Authorizing Provider  amLODipine (NORVASC) 5 MG tablet Take 5 mg by mouth daily.    [provider]  aspirin EC 325 MG tablet Take 1 tablet (325 mg total) by mouth daily. 09/27/18 09/27/19  Elam Dutch, MD  atorvastatin (LIPITOR) 20 MG tablet Take 1 tablet (20 mg total) by mouth daily. 12/23/14   Fay Records, MD  doxycycline (VIBRAMYCIN) 100 MG capsule Take 1 capsule (100 mg total) by mouth 2 (two) times daily. 02/08/19   Daleen Bo, MD  linaclotide [facility]) 290 MCG CAPS capsule Take 290 mcg by mouth at bedtime.    [provider]  oxyCODONE (OXY IR/ROXICODONE) 5 MG immediate release tablet Take 1 tablet (5 mg total) by mouth every 6 (six) hours as needed for moderate pain. Patient not taking: Reported on 11/07/2018 10/23/18   Dagoberto Ligas, PA-C    Family History Family History  Problem Relation Age of Onset  . Hypertension Mother   . Peripheral Artery Disease Brother   . Colon cancer Neg Hx   . Colon polyps Neg Hx     Social History Social History   Tobacco Use  . Smoking status: Current Every Day Smoker    Packs/day: 0.50    Years: 40.00    Pack years: 20.00    Types: Cigarettes  . Smokeless tobacco: Never Used  Substance Use Topics  . Alcohol use: No    Alcohol/week: 0.0 standard drinks  . Drug use: No     Allergies   Patient has no known allergies.   Review of Systems Review of Systems  Gastrointestinal: Positive for abdominal pain.  All other systems reviewed and are negative.    Physical Exam Updated Vital Signs BP (!) 150/82 (BP Location: Left Arm)   Pulse 79   Temp 97.6 F (36.4 C) (Oral)   Resp 19   Ht 5\' 2"  (1.575 m)   Wt 59 kg   SpO2 98%   BMI 23.78 kg/m   Physical Exam Vitals signs and nursing note reviewed.  Constitutional:      General: She is not in acute  distress.    Appearance: She is well-developed. She is not diaphoretic.  HENT:     Head: Normocephalic and atraumatic.  Neck:     Musculoskeletal: Normal range of motion and neck supple.  Cardiovascular:     Rate and Rhythm: Normal rate and regular rhythm.     Heart sounds: No murmur. No friction rub. No gallop.   Pulmonary:     Effort: Pulmonary effort is normal. No respiratory distress.     Breath sounds: Normal breath sounds. No wheezing.  Abdominal:     General: Bowel sounds are normal. There is no  distension.     Palpations: Abdomen is soft.     Tenderness: There is abdominal tenderness in the right upper quadrant, epigastric area and left upper quadrant. There is no right CVA tenderness, left CVA tenderness, guarding or rebound.  Musculoskeletal: Normal range of motion.  Skin:    General: Skin is warm and dry.  Neurological:     Mental Status: She is alert and oriented to person, place, and time.      ED Treatments / Results  Labs (all labs ordered are listed, but only abnormal results are displayed) Labs Reviewed  COMPREHENSIVE METABOLIC PANEL  LIPASE, BLOOD  CBC WITH DIFFERENTIAL/PLATELET    EKG    Radiology No results found.  Procedures Procedures (including critical care time)  Medications Ordered in ED Medications  sodium chloride 0.9 % bolus 1,000 mL (has no administration in time range)  morphine 4 MG/ML injection 4 mg (has no administration in time range)  ondansetron (ZOFRAN) injection 4 mg (has no administration in time range)     Initial Impression / Assessment and Plan / ED Course  I have reviewed the triage vital signs and the nursing notes.  Pertinent labs & imaging results that were available during my care of the patient were reviewed by me and considered in my medical decision making (see chart for details).  Patient returns complaining of abdominal pain after being seen 2 nights prior.  Her CT scan today shows what appears to be an  evolving small bowel obstruction.  Patient has had these in the past.  She is not actively vomiting and this will be treated with IV fluids and medications.  If this progresses, she will likely require an NG tube.  I have spoken with Dr. Olevia Bowens who agrees to admit.  Final Clinical Impressions(s) / ED Diagnoses   Final diagnoses:  None    ED Discharge Orders    None       Veryl Speak, MD 05/30/19 [phone]

## 2019-05-31 ENCOUNTER — Observation Stay (HOSPITAL_COMMUNITY): Payer: Medicare HMO

## 2019-05-31 DIAGNOSIS — J449 Chronic obstructive pulmonary disease, unspecified: Secondary | ICD-10-CM | POA: Diagnosis not present

## 2019-05-31 DIAGNOSIS — K219 Gastro-esophageal reflux disease without esophagitis: Secondary | ICD-10-CM

## 2019-05-31 DIAGNOSIS — E782 Mixed hyperlipidemia: Secondary | ICD-10-CM

## 2019-05-31 DIAGNOSIS — K56600 Partial intestinal obstruction, unspecified as to cause: Secondary | ICD-10-CM | POA: Diagnosis not present

## 2019-05-31 DIAGNOSIS — Z20828 Contact with and (suspected) exposure to other viral communicable diseases: Secondary | ICD-10-CM | POA: Diagnosis not present

## 2019-05-31 DIAGNOSIS — Z720 Tobacco use: Secondary | ICD-10-CM | POA: Diagnosis not present

## 2019-05-31 DIAGNOSIS — J9611 Chronic respiratory failure with hypoxia: Secondary | ICD-10-CM

## 2019-05-31 DIAGNOSIS — R109 Unspecified abdominal pain: Secondary | ICD-10-CM | POA: Diagnosis not present

## 2019-05-31 DIAGNOSIS — K5900 Constipation, unspecified: Secondary | ICD-10-CM | POA: Diagnosis not present

## 2019-05-31 DIAGNOSIS — R69 Illness, unspecified: Secondary | ICD-10-CM | POA: Diagnosis not present

## 2019-05-31 DIAGNOSIS — Z7982 Long term (current) use of aspirin: Secondary | ICD-10-CM | POA: Diagnosis not present

## 2019-05-31 DIAGNOSIS — Z79899 Other long term (current) drug therapy: Secondary | ICD-10-CM | POA: Diagnosis not present

## 2019-05-31 DIAGNOSIS — I10 Essential (primary) hypertension: Secondary | ICD-10-CM | POA: Diagnosis not present

## 2019-05-31 DIAGNOSIS — K5641 Fecal impaction: Secondary | ICD-10-CM | POA: Diagnosis not present

## 2019-05-31 DIAGNOSIS — R1010 Upper abdominal pain, unspecified: Secondary | ICD-10-CM | POA: Diagnosis not present

## 2019-05-31 DIAGNOSIS — Z9049 Acquired absence of other specified parts of digestive tract: Secondary | ICD-10-CM | POA: Diagnosis not present

## 2019-05-31 LAB — CBC WITH DIFFERENTIAL/PLATELET
Abs Immature Granulocytes: 0.01 10*3/uL (ref 0.00–0.07)
Basophils Absolute: 0 10*3/uL (ref 0.0–0.1)
Basophils Relative: 1 %
Eosinophils Absolute: 0.1 10*3/uL (ref 0.0–0.5)
Eosinophils Relative: 2 %
HCT: 41.1 % (ref 36.0–46.0)
Hemoglobin: 12.3 g/dL (ref 12.0–15.0)
Immature Granulocytes: 0 %
Lymphocytes Relative: 50 %
Lymphs Abs: 2.6 10*3/uL (ref 0.7–4.0)
MCH: 27 pg (ref 26.0–34.0)
MCHC: 29.9 g/dL — ABNORMAL LOW (ref 30.0–36.0)
MCV: 90.3 fL (ref 80.0–100.0)
Monocytes Absolute: 0.4 10*3/uL (ref 0.1–1.0)
Monocytes Relative: 8 %
Neutro Abs: 2 10*3/uL (ref 1.7–7.7)
Neutrophils Relative %: 39 %
Platelets: 214 10*3/uL (ref 150–400)
RBC: 4.55 MIL/uL (ref 3.87–5.11)
RDW: 14.6 % (ref 11.5–15.5)
WBC: 5.1 10*3/uL (ref 4.0–10.5)
nRBC: 0 % (ref 0.0–0.2)

## 2019-05-31 LAB — COMPREHENSIVE METABOLIC PANEL
ALT: 15 U/L (ref 0–44)
AST: 17 U/L (ref 15–41)
Albumin: 3.4 g/dL — ABNORMAL LOW (ref 3.5–5.0)
Alkaline Phosphatase: 95 U/L (ref 38–126)
Anion gap: 7 (ref 5–15)
BUN: 7 mg/dL (ref 6–20)
CO2: 23 mmol/L (ref 22–32)
Calcium: 8.5 mg/dL — ABNORMAL LOW (ref 8.9–10.3)
Chloride: 112 mmol/L — ABNORMAL HIGH (ref 98–111)
Creatinine, Ser: 0.8 mg/dL (ref 0.44–1.00)
GFR calc Af Amer: >60 mL/min (ref >60)
GFR calc non Af Amer: >60 mL/min (ref >60)
Glucose, Bld: 84 mg/dL (ref 70–99)
Potassium: 4.3 mmol/L (ref 3.5–5.1)
Sodium: 142 mmol/L (ref 135–145)
Total Bilirubin: 0.7 mg/dL (ref 0.3–1.2)
Total Protein: 5.8 g/dL — ABNORMAL LOW (ref 6.5–8.1)

## 2019-05-31 MED ORDER — HYDRALAZINE HCL 20 MG/ML IJ SOLN: 10.0000 mg | Freq: Three times a day (TID) | INTRAMUSCULAR | Status: DC | PRN

## 2019-05-31 MED ORDER — PANTOPRAZOLE SODIUM 40 MG IV SOLR
40.0000 mg | INTRAVENOUS | Status: DC
  Administered 2019-05-31: 40 mg via INTRAVENOUS
  Filled 2019-05-31 (×2): qty 40

## 2019-05-31 MED ORDER — LINACLOTIDE 145 MCG PO CAPS
290.0000 ug | ORAL_CAPSULE | Freq: Every day | ORAL | Status: DC
  Administered 2019-05-31 – 2019-06-02 (×3): 290 ug via ORAL
  Filled 2019-05-31 (×3): qty 2

## 2019-05-31 NOTE — Progress Notes (Signed)
PROGRESS NOTE    [place]ia Washington  RNH:[id number] DOB: 04/28/1956 DOA: 05/30/2019 PCP: [name], MD    Brief Narrative:     Assessment & Plan: 1-Intractable abdominal pain: in the setting of partial SBO and severe constipation. -appears to be resolving -patient passing gas, no nausea, no vomiting. -will advance diet to Clear liquid and increase activity -following general surgery rec's will resume linzess. -follow clinical response   2-chronic resp failure -patient with hypoxia into mid 80's on RA -reported not using or having O2 at home -records revealed she is supposed to be wearing it -will continue supplementation  3-COPD -no wheezing currently -continue PRN albuterol  4-tobacco abuse -Cessation counseling provided -nicotine patch decline   5-chronic back pain -continue pain meds -resume oral regimen when able to tolerate by mouth.  6-GERD  -continue PPI  7-HLD -resume statins when able to fully tolerate PO's  8-HTN -stable  -continue PRN hydralazine -resume home meds when able to tolerate PO's  9-chronic constipation -resume Linzess   DVT prophylaxis: SCD's Code Status: Full Code Family Communication: no family at bedside  Disposition Plan: remains inpatient, advance diet, resume Linzess, follow electrolytes and continue IVF's/supportive care.  Consultants:   General surgery   Procedures:   See below for x-ray reports   Antimicrobials:  Anti-infectives (From admission, onward)   None       Subjective: Afebrile, no CP, no nausea, no vomiting; reports positive flatus.  Objective: Vitals:   05/30/19 2028 05/30/19 2129 05/31/19 0532 05/31/19 1508  BP:  113/69 (!) 113/92 120/66  Pulse: (!) 54 (!) 52 (!) 54 (!) 49  Resp: 16 16 16 19   Temp:  98 F (36.7 C) 98.2 F (36.8 C) 98.3 F (36.8 C)  TempSrc:  Oral Oral   SpO2: (!) 86% 99% 99% 100%  Weight:      Height:        Intake/Output Summary (Last 24 hours) at 05/31/2019 1623  Last data filed at 05/31/2019 1300 Gross per 24 hour  Intake 0 ml  Output 375 ml  Net -375 ml   Filed Weights   05/30/19 0327  Weight: 59 kg    Examination: General exam: Alert, awake, oriented x 3; in no acute distress. Reports no abd pain, no nausea, no vomiting. Respiratory system: positive rhonchi, no wheezing. Respiratory effort normal. Wearing 2 L [place] supplementation. Cardiovascular system: RRR. No murmurs, rubs, gallops. Gastrointestinal system: Abdomen is nondistended, soft and nontender. No organomegaly or masses felt. Normal bowel sounds heard. Central nervous system: Alert and oriented. No focal neurological deficits. Extremities: No C/C/E, +pedal pulses Skin: No rashes, lesions or ulcers Psychiatry: Judgement and insight appear normal. Mood & affect appropriate.     Data Reviewed: I have personally reviewed following labs and imaging studies  CBC: Recent Labs  Lab 05/26/19 2349 05/30/19 0342 05/31/19 0608  WBC 9.1 7.5 5.1  NEUTROABS  --  2.5 2.0  HGB 13.6 13.3 12.3  HCT 43.9 42.6 41.1  MCV 89.2 88.2 90.3  PLT 216 216 383   Basic Metabolic Panel: Recent Labs  Lab 05/26/19 2349 05/30/19 0342 05/31/19 0608  NA 141 139 142  K 4.2 3.7 4.3  CL 107 109 112*  CO2 23 23 23   GLUCOSE 96 104* 84  BUN 12 13 7   CREATININE 0.94 0.86 0.80  CALCIUM 9.1 8.9 8.5*  MG  --  2.2  --   PHOS  --  4.2  --    GFR: Estimated Creatinine Clearance:  59.1 mL/min (by C-G formula based on SCr of 0.8 mg/dL). Liver Function Tests: Recent Labs  Lab 05/26/19 2349 05/30/19 0342 05/31/19 0608  AST 17 13* 17  ALT 17 13 15   ALKPHOS 104 103 95  BILITOT 0.5 0.5 0.7  PROT 7.3 6.8 5.8*  ALBUMIN 4.3 4.0 3.4*   Recent Labs  Lab 05/26/19 2349 05/30/19 0342  LIPASE 47 65*    Urine analysis:    Component Value Date/Time   COLORURINE STRAW (A) 05/27/2019 0249   APPEARANCEUR CLEAR 05/27/2019 0249   LABSPEC 1.031 (H) 05/27/2019 0249   PHURINE 5.0 05/27/2019 0249   GLUCOSEU  NEGATIVE 05/27/2019 0249   HGBUR NEGATIVE 05/27/2019 0249   BILIRUBINUR NEGATIVE 05/27/2019 0249   KETONESUR NEGATIVE 05/27/2019 0249   PROTEINUR NEGATIVE 05/27/2019 0249   UROBILINOGEN 0.2 09/19/2015 0928   NITRITE NEGATIVE 05/27/2019 0249   LEUKOCYTESUR NEGATIVE 05/27/2019 0249   Recent Results (from the past 240 hour(s))  SARS Coronavirus 2 (CEPHEID - Performed in [place] hospital lab), Hosp Order     Status: None   Collection Time: 05/30/19  5:42 AM   Specimen: Nasopharyngeal Swab  Result Value Ref Range Status   SARS Coronavirus 2 NEGATIVE NEGATIVE Final    Comment: (NOTE) If result is NEGATIVE SARS-CoV-2 target nucleic acids are NOT DETECTED. The SARS-CoV-2 RNA is generally detectable in upper and lower  respiratory specimens during the acute phase of infection. The lowest  concentration of SARS-CoV-2 viral copies this assay can detect is 250  copies / mL. A negative result does not preclude SARS-CoV-2 infection  and should not be used as the sole basis for treatment or other  patient management decisions.  A negative result may occur with  improper specimen collection / handling, submission of specimen other  than nasopharyngeal swab, presence of viral mutation(s) within the  areas targeted by this assay, and inadequate number of viral copies  (<250 copies / mL). A negative result must be combined with clinical  observations, patient history, and epidemiological information. If result is POSITIVE SARS-CoV-2 target nucleic acids are DETECTED. The SARS-CoV-2 RNA is generally detectable in upper and lower  respiratory specimens dur ing the acute phase of infection.  Positive  results are indicative of active infection with SARS-CoV-2.  Clinical  correlation with patient history and other diagnostic information is  necessary to determine patient infection status.  Positive results do  not rule out bacterial infection or co-infection with other viruses. If result is  PRESUMPTIVE POSTIVE SARS-CoV-2 nucleic acids MAY BE PRESENT.   A presumptive positive result was obtained on the submitted specimen  and confirmed on repeat testing.  While 2019 novel coronavirus  (SARS-CoV-2) nucleic acids may be present in the submitted sample  additional confirmatory testing may be necessary for epidemiological  and / or clinical management purposes  to differentiate between  SARS-CoV-2 and other Sarbecovirus currently known to infect humans.  If clinically indicated additional testing with an alternate test  methodology [id number]) is advised. The SARS-CoV-2 RNA is generally  detectable in upper and lower respiratory sp ecimens during the acute  phase of infection. The expected result is Negative. Fact Sheet for Patients:  StrictlyIdeas.no Fact Sheet for Healthcare Providers: BankingDealers.co.za This test is not yet approved or cleared by the [place] FDA and has been authorized for detection and/or diagnosis of SARS-CoV-2 by FDA under an Emergency Use Authorization (EUA).  This EUA will remain in effect (meaning this test can be used) for the duration of  the COVID-19 declaration under Section 564(b)(1) of the Act, 21 U.S.C. section 360bbb-3(b)(1), unless the authorization is terminated or revoked sooner. Performed at [facility], [address] [place], [place] [address]      Radiology Studies: Ct Abdomen Pelvis W Contrast  Result Date: 05/30/2019 CLINICAL DATA:  Abdominal pain, acute, generalized. EXAM: CT ABDOMEN AND PELVIS WITH CONTRAST TECHNIQUE: Multidetector CT imaging of the abdomen and pelvis was performed using the standard protocol following bolus administration of intravenous contrast. CONTRAST:  153mL OMNIPAQUE IOHEXOL 300 MG/ML  SOLN COMPARISON:  CT of the abdomen and pelvis 05/27/2019 and 04/27/2018 FINDINGS: Lower chest: Lung bases demonstrate mild dependent atelectasis. Heart size is within normal  limits. No significant pleural or pericardial effusion is present. Hepatobiliary: Liver is within normal limits. Mild intra and extrahepatic biliary dilation is stable following cholecystectomy. Pancreas: Mild prominence the pancreatic duct is stable. No obstructing lesion is present. No parenchymal mass is present. Spleen: Normal in size without focal abnormality. Adrenals/Urinary Tract: Adrenal glands are normal bilaterally. Punctate nonobstructing stone in the midportion of the right kidney is stable. No other significant stone or mass lesion is present. There is no obstruction. The urinary bladder is within normal limits. Stomach/Bowel: Stomach and duodenum are within normal limits. Small bowel adhesions are present. There is increased distension of essentially single loop of small bowel in the mid abdomen measuring 2.8 cm maximally. More distal small bowel is collapsed. Terminal ileum is unremarkable. Appendix is visualized and normal. The ascending and transverse colon are within normal limits. Descending and sigmoid colon are mostly collapsed. Vascular/Lymphatic: Extensive atherosclerotic calcifications and irregularity are noted in the aorta and branch vessels. 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 Fem-fem bypass graft is patent. Retroperitoneal node dissection is again noted. No significant retroperitoneal adenopathy is present. Reproductive: Status post hysterectomy. No adnexal masses. Other: No abdominal wall hernia or abnormality. No abdominopelvic ascites. Musculoskeletal: Vertebral body heights and alignment are maintained. No focal lytic or blastic lesions are present. Bony pelvis is within normal limits. The hips are located and within normal limits. IMPRESSION: 1. Probable developing small-bowel obstruction. Focal dilated loop of small bowel with distal collapse. This is likely secondary to adhesions the ventral abdomen. 2.  Aortic Atherosclerosis (ICD10-I70.0).  Electronically Signed   By: San Morelle M.D.   On: 05/30/2019 05:37   Dg Abd 2 Views  Result Date: 05/31/2019 CLINICAL DATA:  Abdominal pain and bloating. EXAM: ABDOMEN - 2 VIEW COMPARISON:  CT scan May 30, 2019 FINDINGS: No small bowel dilatation identified to suggest small bowel obstruction. Mild fecal loading in the ascending colon. Surgical clips in the pelvis. Cholecystectomy clips. No free air, portal venous gas, or pneumatosis identified. IMPRESSION: No acute abnormalities. Mild fecal loading in the ascending colon. No evidence of bowel obstruction. Electronically Signed   By: Dorise Bullion III M.D   On: 05/31/2019 09:32    Scheduled Meds: . linaclotide  290 mcg Oral QAC breakfast   Continuous Infusions: . 0.9 % NaCl with KCl 20 mEq / L 100 mL/hr at 05/31/19 1226     LOS: 0 days    Time spent: 30 minutes.    Barton Dubois, MD [facility] Pager [phone]  05/31/2019, 4:23 PM

## 2019-05-31 NOTE — Consult Note (Signed)
[facility] Consult  Reason for Consult: SBO?  Referring Physician:  Dr. Dyann Kief   Chief Complaint    Abdominal Pain      Virginia Washington is a 63 y.o. female.  HPI: Received consult this AM. Virginia Washington is a 63 yo with prior SBO related to thickening of her SB who comes into the ED multiple times this week with abdominal pain and bloating. She takes Linzess to have a Bms and takes it every other/ every few days. Her last BM was Thursday. She says that she has not vomited or had nausea. Since admission she has had no nausea and is passing flatus this morning. She wants to have a diet.  We discussed that she was suppose to get a CT enterography 05/2018 and this was scheduled by GI but the patient never received this test that I can find. She believes she did.  We discussed that her Xray this AM demonstrates no obstruction but stool in her ascending colon. We also discussed that her CT on admission is more of a gradual taper on my read, so if anything I would say more of a partial obstruction.  She otherwise is feeling well now. She says her pain is controlled with the pain medication.   Past Medical History:  Diagnosis Date  . Chronic back pain   . Constipation   . Constipation   . COPD (chronic obstructive pulmonary disease) ([place])   . DVT (deep venous thrombosis) ([place])   . GERD (gastroesophageal reflux disease)   . Headache   . History of kidney stones   . Hyperlipidemia   . Hypertension     Past Surgical History:  Procedure Laterality Date  . 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  Normal right aortoiliac vessels.  Three-vessel runoff bilaterally.  (Sluggish flow of the left side due to occlusion.  . ABDOMINAL  HYSTERECTOMY    . CHOLECYSTECTOMY    . COLONOSCOPY  2007   Dr. Laural Golden: small internal hemorrhoids, otherwise normal. exam could be compromised due to quality of prep  . COLONOSCOPY N/A 02/15/2018   Dr. Oneida Alar: 2 simple adenomas, external and internal hemorrhoids. Next colonoscopy 2024-2026  . ESOPHAGOGASTRODUODENOSCOPY N/A 05/09/2018   Procedure: ESOPHAGOGASTRODUODENOSCOPY (EGD);  Surgeon: Danie Binder, MD;  [facility];  Service: Endoscopy;  Laterality: N/A;  2:15pm- Possible Pyloric Dilation  . FEMORAL-FEMORAL BYPASS GRAFT Bilateral 10/21/2018   Procedure: RIGHT TO LEFT FEMORAL ARTERY BYPASS GRAFT;  Surgeon: Elam Dutch, MD;  Location: [place];  Service: Vascular;  Laterality: Bilateral;  . LOWER EXTREMITY ANGIOGRAPHY N/A 09/27/2018   Procedure: LOWER EXTREMITY ANGIOGRAPHY;  Surgeon: Elam Dutch, MD;  Location: [place] [facility];  Service: Cardiovascular;  Laterality: N/A;  . NM MYOVIEW LTD  12/2014   [facility]: Normal Myoview.  Nonischemic.  Marland Kitchen TRANSTHORACIC ECHOCARDIOGRAM  12/2011   EF 55 to 60%.  No regional wall motion normality.  GRII DD.  No significant valve disease.    Family History  Problem Relation Age of Onset  . Hypertension Mother   . Peripheral Artery Disease Brother   . Colon cancer Neg Hx   . Colon polyps Neg Hx     Social History   Tobacco Use  . Smoking status: Current Every Day  Smoker    Packs/day: 0.50    Years: 40.00    Pack years: 20.00    Types: Cigarettes  . Smokeless tobacco: Never Used  Substance Use Topics  . Alcohol use: No    Alcohol/week: 0.0 standard drinks  . Drug use: No    Medications:  I have reviewed the patient's current medications. Prior to Admission:  Medications Prior to Admission  Medication Sig Dispense Refill Last Dose  . amLODipine (NORVASC) 5 MG tablet Take 5 mg by mouth daily.   05/29/2019 at 2100  . aspirin EC 325 MG tablet Take 1 tablet (325 mg total) by mouth daily. 100 tablet 3 05/28/2019   . linaclotide (LINZESS) 290 MCG CAPS capsule Take 290 mcg by mouth at bedtime as needed.    Past Week at Unknown time   Scheduled: . linaclotide  290 mcg Oral QAC breakfast   Continuous: . 0.9 % NaCl with KCl 20 mEq / L 100 mL/hr at 05/31/19 0254   XBD:[id number] **OR** acetaminophen, morphine injection  No Known Allergies   ROS:  A comprehensive review of systems was negative except for: Gastrointestinal: positive for abdominal pain, constipation and bloating  Blood pressure (!) 113/92, pulse (!) 54, temperature 98.2 F (36.8 C), temperature source Oral, resp. rate 16, height 5\' 2"  (1.575 m), weight 59 kg, SpO2 99 %. Physical Exam Vitals signs reviewed.  Constitutional:      Appearance: She is well-developed.  HENT:     Head: Normocephalic.  Eyes:     Extraocular Movements: Extraocular movements intact.  Cardiovascular:     Rate and Rhythm: Normal rate.  Pulmonary:     Effort: Pulmonary effort is normal.  Abdominal:     General: There is distension.     Palpations: Abdomen is soft.     Tenderness: There is abdominal tenderness.     Comments: Mildly distended, and minimally tender with deep palpation  Musculoskeletal:     Comments: Moves all extremities  Skin:    General: Skin is warm and dry.  Neurological:     General: No focal deficit present.     Mental Status: She is alert and oriented to person, place, and time.  Psychiatric:        Mood and Affect: Mood normal.        Behavior: Behavior normal.     Results: Results for orders placed or performed during the hospital encounter of 05/30/19 (from the past 48 hour(s))  Comprehensive metabolic panel     Status: Abnormal   Collection Time: 05/30/19  3:42 AM  Result Value Ref Range   Sodium 139 135 - 145 mmol/L   Potassium 3.7 3.5 - 5.1 mmol/L   Chloride 109 98 - 111 mmol/L   CO2 23 22 - 32 mmol/L   Glucose, Bld 104 (H) 70 - 99 mg/dL   BUN 13 6 - 20 mg/dL   Creatinine, Ser 0.86 0.44 - 1.00 mg/dL    Calcium 8.9 8.9 - 10.3 mg/dL   Total Protein 6.8 6.5 - 8.1 g/dL   Albumin 4.0 3.5 - 5.0 g/dL   AST 13 (L) 15 - 41 U/L   ALT 13 0 - 44 U/L   Alkaline Phosphatase 103 38 - 126 U/L   Total Bilirubin 0.5 0.3 - 1.2 mg/dL   GFR calc non Af Amer >60 >60 mL/min   GFR calc Af Amer >60 >60 mL/min   Anion gap 7 5 - 15    Comment: Performed at [facility]  [address] [place], [place] [address]  Lipase, blood     Status: Abnormal   Collection Time: 05/30/19  3:42 AM  Result Value Ref Range   Lipase 65 (H) 11 - 51 U/L    Comment: Performed at [facility], [address], [place], [place] [address]  CBC with Differential     Status: Abnormal   Collection Time: 05/30/19  3:42 AM  Result Value Ref Range   WBC 7.5 4.0 - 10.5 K/uL   RBC 4.83 3.87 - 5.11 MIL/uL   Hemoglobin 13.3 12.0 - 15.0 g/dL   HCT 42.6 36.0 - 46.0 %   MCV 88.2 80.0 - 100.0 fL   MCH 27.5 26.0 - 34.0 pg   MCHC 31.2 30.0 - 36.0 g/dL   RDW 14.6 11.5 - 15.5 %   Platelets 216 150 - 400 K/uL   nRBC 0.0 0.0 - 0.2 %   Neutrophils Relative % 33 %   Neutro Abs 2.5 1.7 - 7.7 K/uL   Lymphocytes Relative 56 %   Lymphs Abs 4.2 (H) 0.7 - 4.0 K/uL   Monocytes Relative 8 %   Monocytes Absolute 0.6 0.1 - 1.0 K/uL   Eosinophils Relative 2 %   Eosinophils Absolute 0.2 0.0 - 0.5 K/uL   Basophils Relative 1 %   Basophils Absolute 0.1 0.0 - 0.1 K/uL   Immature Granulocytes 0 %   Abs Immature Granulocytes 0.01 0.00 - 0.07 K/uL    Comment: Performed at [facility], [address] [place], [place] [address]  Magnesium     Status: None   Collection Time: 05/30/19  3:42 AM  Result Value Ref Range   Magnesium 2.2 1.7 - 2.4 mg/dL    Comment: Performed at [facility], [address], [place], [place] [address]  Phosphorus     Status: None   Collection Time: 05/30/19  3:42 AM  Result Value Ref Range   Phosphorus 4.2 2.5 - 4.6 mg/dL    Comment: Performed at [facility], [address] [place], [place] [address]  SARS Coronavirus 2 (CEPHEID  - Performed in [place] hospital lab), Hosp Order     Status: None   Collection Time: 05/30/19  5:42 AM   Specimen: Nasopharyngeal Swab  Result Value Ref Range   SARS Coronavirus 2 NEGATIVE NEGATIVE    Comment: (NOTE) If result is NEGATIVE SARS-CoV-2 target nucleic acids are NOT DETECTED. The SARS-CoV-2 RNA is generally detectable in upper and lower  respiratory specimens during the acute phase of infection. The lowest  concentration of SARS-CoV-2 viral copies this assay can detect is 250  copies / mL. A negative result does not preclude SARS-CoV-2 infection  and should not be used as the sole basis for treatment or other  patient management decisions.  A negative result may occur with  improper specimen collection / handling, submission of specimen other  than nasopharyngeal swab, presence of viral mutation(s) within the  areas targeted by this assay, and inadequate number of viral copies  (<250 copies / mL). A negative result must be combined with clinical  observations, patient history, and epidemiological information. If result is POSITIVE SARS-CoV-2 target nucleic acids are DETECTED. The SARS-CoV-2 RNA is generally detectable in upper and lower  respiratory specimens dur ing the acute phase of infection.  Positive  results are indicative of active infection with SARS-CoV-2.  Clinical  correlation with patient history and other diagnostic information is  necessary to determine patient infection status.  Positive results do  not rule out bacterial infection or co-infection with other viruses. If result is PRESUMPTIVE POSTIVE SARS-CoV-2 nucleic acids MAY BE PRESENT.   A presumptive positive result was obtained on the submitted specimen  and confirmed on repeat testing.  While 2019 novel coronavirus  (SARS-CoV-2) nucleic acids may be present in the submitted sample  additional confirmatory testing may be necessary for epidemiological  and / or clinical management purposes  to  differentiate between  SARS-CoV-2 and other Sarbecovirus currently known to infect humans.  If clinically indicated additional testing with an alternate test  methodology [phone]) is advised. The SARS-CoV-2 RNA is generally  detectable in upper and lower respiratory sp ecimens during the acute  phase of infection. The expected result is Negative. Fact Sheet for Patients:  StrictlyIdeas.no Fact Sheet for Healthcare Providers: BankingDealers.co.za This test is not yet approved or cleared by the [place] FDA and has been authorized for detection and/or diagnosis of SARS-CoV-2 by FDA under an Emergency Use Authorization (EUA).  This EUA will remain in effect (meaning this test can be used) for the duration of the COVID-19 declaration under Section 564(b)(1) of the Act, 21 U.S.C. section 360bbb-3(b)(1), unless the authorization is terminated or revoked sooner. Performed at [facility], [address] [place], [place] [address]   CBC WITH DIFFERENTIAL     Status: Abnormal   Collection Time: 05/31/19  6:08 AM  Result Value Ref Range   WBC 5.1 4.0 - 10.5 K/uL   RBC 4.55 3.87 - 5.11 MIL/uL   Hemoglobin 12.3 12.0 - 15.0 g/dL   HCT 41.1 36.0 - 46.0 %   MCV 90.3 80.0 - 100.0 fL   MCH 27.0 26.0 - 34.0 pg   MCHC 29.9 (L) 30.0 - 36.0 g/dL   RDW 14.6 11.5 - 15.5 %   Platelets 214 150 - 400 K/uL   nRBC 0.0 0.0 - 0.2 %   Neutrophils Relative % 39 %   Neutro Abs 2.0 1.7 - 7.7 K/uL   Lymphocytes Relative 50 %   Lymphs Abs 2.6 0.7 - 4.0 K/uL   Monocytes Relative 8 %   Monocytes Absolute 0.4 0.1 - 1.0 K/uL   Eosinophils Relative 2 %   Eosinophils Absolute 0.1 0.0 - 0.5 K/uL   Basophils Relative 1 %   Basophils Absolute 0.0 0.0 - 0.1 K/uL   Immature Granulocytes 0 %   Abs Immature Granulocytes 0.01 0.00 - 0.07 K/uL    Comment: Performed at [facility], [address], [place], [place] [address]  Comprehensive metabolic panel     Status:  Abnormal   Collection Time: 05/31/19  6:08 AM  Result Value Ref Range   Sodium 142 135 - 145 mmol/L   Potassium 4.3 3.5 - 5.1 mmol/L   Chloride 112 (H) 98 - 111 mmol/L   CO2 23 22 - 32 mmol/L   Glucose, Bld 84 70 - 99 mg/dL   BUN 7 6 - 20 mg/dL   Creatinine, Ser 0.80 0.44 - 1.00 mg/dL   Calcium 8.5 (L) 8.9 - 10.3 mg/dL   Total Protein 5.8 (L) 6.5 - 8.1 g/dL   Albumin 3.4 (L) 3.5 - 5.0 g/dL   AST 17 15 - 41 U/L   ALT 15 0 - 44 U/L   Alkaline Phosphatase 95 38 - 126 U/L   Total Bilirubin 0.7 0.3 - 1.2 mg/dL   GFR calc non Af Amer >60 >60 mL/min   GFR calc Af Amer >60 >60 mL/min   Anion gap 7  5 - 15    Comment: Performed at [facility], [address], [place], [place] [address]   Personally reviewed CT- coronal images best for following SB, dilated bowel only about 2.5 cm dilated, gradually tapers to the left lower quadrant to decompressed bowel, stool in colon   Xray abd- stool in ascending colon, no dilated loops or bowel, no obstruction picture seen   Ct Abdomen Pelvis W Contrast  Result Date: 05/30/2019 CLINICAL DATA:  Abdominal pain, acute, generalized. EXAM: CT ABDOMEN AND PELVIS WITH CONTRAST TECHNIQUE: Multidetector CT imaging of the abdomen and pelvis was performed using the standard protocol following bolus administration of intravenous contrast. CONTRAST:  154mL OMNIPAQUE IOHEXOL 300 MG/ML  SOLN COMPARISON:  CT of the abdomen and pelvis 05/27/2019 and 04/27/2018 FINDINGS: Lower chest: Lung bases demonstrate mild dependent atelectasis. Heart size is within normal limits. No significant pleural or pericardial effusion is present. Hepatobiliary: Liver is within normal limits. Mild intra and extrahepatic biliary dilation is stable following cholecystectomy. Pancreas: Mild prominence the pancreatic duct is stable. No obstructing lesion is present. No parenchymal mass is present. Spleen: Normal in size without focal abnormality. Adrenals/Urinary Tract: Adrenal glands are normal  bilaterally. Punctate nonobstructing stone in the midportion of the right kidney is stable. No other significant stone or mass lesion is present. There is no obstruction. The urinary bladder is within normal limits. Stomach/Bowel: Stomach and duodenum are within normal limits. Small bowel adhesions are present. There is increased distension of essentially single loop of small bowel in the mid abdomen measuring 2.8 cm maximally. More distal small bowel is collapsed. Terminal ileum is unremarkable. Appendix is visualized and normal. The ascending and transverse colon are within normal limits. Descending and sigmoid colon are mostly collapsed. Vascular/Lymphatic: Extensive atherosclerotic calcifications and irregularity are noted in the aorta and branch vessels. 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 Fem-fem bypass graft is patent. Retroperitoneal node dissection is again noted. No significant retroperitoneal adenopathy is present. Reproductive: Status post hysterectomy. No adnexal masses. Other: No abdominal wall hernia or abnormality. No abdominopelvic ascites. Musculoskeletal: Vertebral body heights and alignment are maintained. No focal lytic or blastic lesions are present. Bony pelvis is within normal limits. The hips are located and within normal limits. IMPRESSION: 1. Probable developing small-bowel obstruction. Focal dilated loop of small bowel with distal collapse. This is likely secondary to adhesions the ventral abdomen. 2.  Aortic Atherosclerosis (ICD10-I70.0). Electronically Signed   By: San Morelle M.D.   On: 05/30/2019 05:37     Assessment & Plan:  SHAMEEKA SILLIMAN is a 63 y.o. female with a pSBO at the most which seems to be resolving with patient having flatus. She has not taken her Linzess since Thursday. She has stool in her colon. She is hungry. Her labs are reassuring.   -Clear diet adv as tolerated  -Ordered Linzess -Told patient to go slow and if has pain  or bloating to stop the diet -Will discuss CT with radiology Monday. I do not see thickening on this CT like the prior CT. She never received the CTE that I can tell, but will confirm with them, and see if they think it is still necessary at this time.   All questions were answered to the satisfaction of the patient. Discussed with Dr. Dyann Kief.    Virl Cagey 05/31/2019, 9:31 AM

## 2019-06-01 DIAGNOSIS — K219 Gastro-esophageal reflux disease without esophagitis: Secondary | ICD-10-CM | POA: Diagnosis not present

## 2019-06-01 DIAGNOSIS — I10 Essential (primary) hypertension: Secondary | ICD-10-CM | POA: Diagnosis not present

## 2019-06-01 DIAGNOSIS — R69 Illness, unspecified: Secondary | ICD-10-CM | POA: Diagnosis not present

## 2019-06-01 DIAGNOSIS — Z7982 Long term (current) use of aspirin: Secondary | ICD-10-CM | POA: Diagnosis not present

## 2019-06-01 DIAGNOSIS — R1010 Upper abdominal pain, unspecified: Secondary | ICD-10-CM | POA: Diagnosis not present

## 2019-06-01 DIAGNOSIS — J9611 Chronic respiratory failure with hypoxia: Secondary | ICD-10-CM | POA: Diagnosis not present

## 2019-06-01 DIAGNOSIS — Z9049 Acquired absence of other specified parts of digestive tract: Secondary | ICD-10-CM | POA: Diagnosis not present

## 2019-06-01 DIAGNOSIS — R109 Unspecified abdominal pain: Secondary | ICD-10-CM | POA: Diagnosis not present

## 2019-06-01 DIAGNOSIS — Z720 Tobacco use: Secondary | ICD-10-CM | POA: Diagnosis not present

## 2019-06-01 DIAGNOSIS — K5900 Constipation, unspecified: Secondary | ICD-10-CM | POA: Diagnosis not present

## 2019-06-01 DIAGNOSIS — K56600 Partial intestinal obstruction, unspecified as to cause: Secondary | ICD-10-CM | POA: Diagnosis not present

## 2019-06-01 DIAGNOSIS — Z20828 Contact with and (suspected) exposure to other viral communicable diseases: Secondary | ICD-10-CM | POA: Diagnosis not present

## 2019-06-01 DIAGNOSIS — E782 Mixed hyperlipidemia: Secondary | ICD-10-CM | POA: Diagnosis not present

## 2019-06-01 DIAGNOSIS — Z79899 Other long term (current) drug therapy: Secondary | ICD-10-CM | POA: Diagnosis not present

## 2019-06-01 DIAGNOSIS — J449 Chronic obstructive pulmonary disease, unspecified: Secondary | ICD-10-CM | POA: Diagnosis not present

## 2019-06-01 LAB — CBC
HCT: 44 % (ref 36.0–46.0)
Hemoglobin: 13.2 g/dL (ref 12.0–15.0)
MCH: 27.4 pg (ref 26.0–34.0)
MCHC: 30 g/dL (ref 30.0–36.0)
MCV: 91.3 fL (ref 80.0–100.0)
Platelets: 199 10*3/uL (ref 150–400)
RBC: 4.82 MIL/uL (ref 3.87–5.11)
RDW: 14.3 % (ref 11.5–15.5)
WBC: 4.5 10*3/uL (ref 4.0–10.5)
nRBC: 0 % (ref 0.0–0.2)

## 2019-06-01 LAB — BASIC METABOLIC PANEL
Anion gap: 7 (ref 5–15)
BUN: 6 mg/dL (ref 6–20)
CO2: 25 mmol/L (ref 22–32)
Calcium: 8.9 mg/dL (ref 8.9–10.3)
Chloride: 108 mmol/L (ref 98–111)
Creatinine, Ser: 0.84 mg/dL (ref 0.44–1.00)
GFR calc Af Amer: >60 mL/min (ref >60)
GFR calc non Af Amer: >60 mL/min (ref >60)
Glucose, Bld: 88 mg/dL (ref 70–99)
Potassium: 4.2 mmol/L (ref 3.5–5.1)
Sodium: 140 mmol/L (ref 135–145)

## 2019-06-01 LAB — HIV ANTIBODY (ROUTINE TESTING W REFLEX): HIV Screen 4th Generation wRfx: NONREACTIVE

## 2019-06-01 MED ORDER — ASPIRIN 325 MG PO TABS
325.0000 mg | ORAL_TABLET | Freq: Every day | ORAL | Status: DC
  Administered 2019-06-01 – 2019-06-02 (×2): 325 mg via ORAL
  Filled 2019-06-01 (×2): qty 1

## 2019-06-01 MED ORDER — PANTOPRAZOLE SODIUM 40 MG PO TBEC
40.0000 mg | DELAYED_RELEASE_TABLET | Freq: Every day | ORAL | Status: DC
  Administered 2019-06-01 – 2019-06-02 (×2): 40 mg via ORAL
  Filled 2019-06-01 (×2): qty 1

## 2019-06-01 MED ORDER — OXYCODONE HCL 5 MG PO TABS
5.0000 mg | ORAL_TABLET | ORAL | Status: DC | PRN
  Administered 2019-06-01: 5 mg via ORAL
  Filled 2019-06-01: qty 1

## 2019-06-01 MED ORDER — ATORVASTATIN CALCIUM 20 MG PO TABS
20.0000 mg | ORAL_TABLET | Freq: Every day | ORAL | Status: DC
  Administered 2019-06-01: 20 mg via ORAL
  Filled 2019-06-01: qty 1

## 2019-06-01 MED ORDER — AMLODIPINE BESYLATE 5 MG PO TABS
5.0000 mg | ORAL_TABLET | Freq: Every day | ORAL | Status: DC
  Administered 2019-06-01 – 2019-06-02 (×2): 5 mg via ORAL
  Filled 2019-06-01 (×2): qty 1

## 2019-06-01 NOTE — Progress Notes (Signed)
[facility] Progress Note     Subjective: Feeling better. Tolerated diet and is on soft diet. Had BM yesterday that was large after Linzess she reports, but this was not recorded. Linzess again this AM.   Objective: Vital signs in last 24 hours: Temp:  [97.6 F (36.4 C)-98.3 F (36.8 C)] 97.6 F (36.4 C) (06/14 0550) Pulse Rate:  [49-58] 49 (06/14 0550) Resp:  [19] 19 (06/14 0550) BP: (120-136)/(66-73) 136/73 (06/14 0550) SpO2:  [99 %-100 %] 100 % (06/14 0550) Last BM Date: 05/31/19  Intake/Output from previous day: 06/13 0701 - 06/14 0700 In: 4167.3 [P.O.:360; I.V.:3807.3] Out: 375 [Urine:375] Intake/Output this shift: No intake/output data recorded.  General appearance: alert, cooperative and no distress Resp: normal work of breathing GI: soft, non-tender; bowel sounds normal; no masses,  no organomegaly  Lab Results:  Recent Labs    05/31/19 0608 06/01/19 0621  WBC 5.1 4.5  HGB 12.3 13.2  HCT 41.1 44.0  PLT 214 199   BMET Recent Labs    05/31/19 0608 06/01/19 0621  NA 142 140  K 4.3 4.2  CL 112* 108  CO2 23 25  GLUCOSE 84 88  BUN 7 6  CREATININE 0.80 0.84  CALCIUM 8.5* 8.9   PT/INR No results for input(s): LABPROT, INR in the last 72 hours.  Studies/Results: Dg Abd 2 Views  Result Date: 05/31/2019 CLINICAL DATA:  Abdominal pain and bloating. EXAM: ABDOMEN - 2 VIEW COMPARISON:  CT scan May 30, 2019 FINDINGS: No small bowel dilatation identified to suggest small bowel obstruction. Mild fecal loading in the ascending colon. Surgical clips in the pelvis. Cholecystectomy clips. No free air, portal venous gas, or pneumatosis identified. IMPRESSION: No acute abnormalities. Mild fecal loading in the ascending colon. No evidence of bowel obstruction. Electronically Signed   By: Dorise Bullion III M.D   On: 05/31/2019 09:32    Assessment/Plan: Ms. Caplin is a 63 yo with resolved SBO that is on a diet and having BMs. She is going to stay  until tomorrow to ensure she is tolerating her diet and continues to have BMs.  I will discuss her CT and prior CT and need for CTE with radiology tomorrow    LOS: 0 days    Virl Cagey 06/01/2019

## 2019-06-01 NOTE — Progress Notes (Addendum)
PROGRESS NOTE    TAKARA SERMONS  RXV:[id number] DOB: Apr 30, 1956 DOA: 05/30/2019 PCP: [name], MD    Brief Narrative:  63 y.o. female with medical history significant of chronic back pain, COPD, history of DVT, headaches, urolithiasis, hyperlipidemia, hypertension, GERD, chronic constipation, history of SBO, history of partial SBO who is returning to the emergency department for the second time in the past 3 days due to persistent abdominal pain.  She has experienced more distention, has had some nausea, but denies emesis.  She denies fever, diarrhea, melena or hematochezia.  She denies flank pain, dysuria, frequency or hematuria.  Denies chest pain, palpitations, diaphoresis, PND, orthopnea or pitting edema of the lower extremities.  No dysuria, frequency or hematuria.  The patient states that the pain and overall symptoms feel similar to when she has bowel obstruction.  ED Course: Initial vital signs temperature 97.6 F, pulse 79, respiration 19, blood pressure 150/82 mmHg and O2 sat 98% on room air.  The patient received a 1000 mL NS bolus, morphine sulfate 4 mg IVP and ondansetron 4 mg IVP.    Assessment & Plan: 1-Intractable abdominal pain: in the setting of partial SBO and severe constipation. -appears to be resolving/resolved. -patient passing gas and expressing having 2 bowel movements overnight. -Denies nausea vomiting. -following general surgery rec's will resume linzess and continue advancing diet.. -follow clinical response   2-chronic resp failure -Patient oxygen saturation has stabilized on room air -Currently no requiring supplementation. -Continue monitoring. -Will reassess saturation with activity  3-COPD -no wheezing currently -continue PRN albuterol  4-tobacco abuse -Cessation counseling provided -nicotine patch declined   5-chronic back pain -continue pain meds -At this moment will attempt oral analgesics to assess tolerability.  6-GERD  -continue  PPI  7-HLD -will resume Lipitor.  8-HTN -stable  -Resume oral amlodipine. -Follow vital signs.  9-chronic constipation -resume Linzess   DVT prophylaxis: SCD's Code Status: Full Code Family Communication: no family at bedside  Disposition Plan: remains inpatient, advance diet, resume Linzess, encourage oral intake and transition medications to by mouth to prevent debility.  Hopefully discharge home tomorrow if remains stable.  Consultants:   General surgery   Procedures:   See below for x-ray reports   Antimicrobials:  Anti-infectives (From admission, onward)   None      Subjective: No fever, no chest pain, no shortness of breath, no nausea, no vomiting.  Patient reports having 2 bowel movements overnight; nursing staff not notified.  Objective: Vitals:   05/31/19 0532 05/31/19 1508 05/31/19 2113 06/01/19 0550  BP: (!) 113/92 120/66 121/68 136/73  Pulse: (!) 54 (!) 49 (!) 58 (!) 49  Resp: 16 19 19 19   Temp: 98.2 F (36.8 C) 98.3 F (36.8 C) 98.1 F (36.7 C) 97.6 F (36.4 C)  TempSrc: Oral  Oral Oral  SpO2: 99% 100% 99% 100%  Weight:      Height:        Intake/Output Summary (Last 24 hours) at 06/01/2019 1245 Last data filed at 06/01/2019 5093 Gross per 24 hour  Intake 4167.26 ml  Output 250 ml  Net 3917.26 ml   Filed Weights   05/30/19 0327  Weight: 59 kg    Examination: General exam: Alert, awake, oriented x 3; denies nausea vomiting.  Patient reports no abdominal pain currently.  Positive bowel movements overnight. Respiratory system: Positive rhonchi right, no wheezing, no crackles. Cardiovascular system:RRR. No murmurs, rubs, gallops. Gastrointestinal system: Abdomen is nondistended, soft and nontender. No organomegaly or masses felt. Normal  bowel sounds heard. Central nervous system: Alert and oriented. No focal neurological deficits. Extremities: No C/C/E, +pedal pulses Skin: No rashes, lesions or ulcers Psychiatry: Judgement and insight  appear normal. Mood & affect appropriate.    Data Reviewed: I have personally reviewed following labs and imaging studies  CBC: Recent Labs  Lab 05/26/19 2349 05/30/19 0342 05/31/19 0608 06/01/19 0621  WBC 9.1 7.5 5.1 4.5  NEUTROABS  --  2.5 2.0  --   HGB 13.6 13.3 12.3 13.2  HCT 43.9 42.6 41.1 44.0  MCV 89.2 88.2 90.3 91.3  PLT 216 216 214 662   Basic Metabolic Panel: Recent Labs  Lab 05/26/19 2349 05/30/19 0342 05/31/19 0608 06/01/19 0621  NA 141 139 142 140  K 4.2 3.7 4.3 4.2  CL 107 109 112* 108  CO2 23 23 23 25   GLUCOSE 96 104* 84 88  BUN 12 13 7 6   CREATININE 0.94 0.86 0.80 0.84  CALCIUM 9.1 8.9 8.5* 8.9  MG  --  2.2  --   --   PHOS  --  4.2  --   --    GFR: Estimated Creatinine Clearance: 56.3 mL/min (by C-G formula based on SCr of 0.84 mg/dL). Liver Function Tests: Recent Labs  Lab 05/26/19 2349 05/30/19 0342 05/31/19 0608  AST 17 13* 17  ALT 17 13 15   ALKPHOS 104 103 95  BILITOT 0.5 0.5 0.7  PROT 7.3 6.8 5.8*  ALBUMIN 4.3 4.0 3.4*   Recent Labs  Lab 05/26/19 2349 05/30/19 0342  LIPASE 47 65*    Urine analysis:    Component Value Date/Time   COLORURINE STRAW (A) 05/27/2019 0249   APPEARANCEUR CLEAR 05/27/2019 0249   LABSPEC 1.031 (H) 05/27/2019 0249   PHURINE 5.0 05/27/2019 0249   GLUCOSEU NEGATIVE 05/27/2019 0249   HGBUR NEGATIVE 05/27/2019 0249   BILIRUBINUR NEGATIVE 05/27/2019 0249   KETONESUR NEGATIVE 05/27/2019 0249   PROTEINUR NEGATIVE 05/27/2019 0249   UROBILINOGEN 0.2 09/19/2015 0928   NITRITE NEGATIVE 05/27/2019 0249   LEUKOCYTESUR NEGATIVE 05/27/2019 0249   Recent Results (from the past 240 hour(s))  SARS Coronavirus 2 (CEPHEID - Performed in [place] hospital lab), Hosp Order     Status: None   Collection Time: 05/30/19  5:42 AM   Specimen: Nasopharyngeal Swab  Result Value Ref Range Status   SARS Coronavirus 2 NEGATIVE NEGATIVE Final    Comment: (NOTE) If result is NEGATIVE SARS-CoV-2 target nucleic acids are  NOT DETECTED. The SARS-CoV-2 RNA is generally detectable in upper and lower  respiratory specimens during the acute phase of infection. The lowest  concentration of SARS-CoV-2 viral copies this assay can detect is 250  copies / mL. A negative result does not preclude SARS-CoV-2 infection  and should not be used as the sole basis for treatment or other  patient management decisions.  A negative result may occur with  improper specimen collection / handling, submission of specimen other  than nasopharyngeal swab, presence of viral mutation(s) within the  areas targeted by this assay, and inadequate number of viral copies  (<250 copies / mL). A negative result must be combined with clinical  observations, patient history, and epidemiological information. If result is POSITIVE SARS-CoV-2 target nucleic acids are DETECTED. The SARS-CoV-2 RNA is generally detectable in upper and lower  respiratory specimens dur ing the acute phase of infection.  Positive  results are indicative of active infection with SARS-CoV-2.  Clinical  correlation with patient history and other diagnostic information is  necessary to determine patient infection status.  Positive results do  not rule out bacterial infection or co-infection with other viruses. If result is PRESUMPTIVE POSTIVE SARS-CoV-2 nucleic acids MAY BE PRESENT.   A presumptive positive result was obtained on the submitted specimen  and confirmed on repeat testing.  While 2019 novel coronavirus  (SARS-CoV-2) nucleic acids may be present in the submitted sample  additional confirmatory testing may be necessary for epidemiological  and / or clinical management purposes  to differentiate between  SARS-CoV-2 and other Sarbecovirus currently known to infect humans.  If clinically indicated additional testing with an alternate test  methodology [phone]) is advised. The SARS-CoV-2 RNA is generally  detectable in upper and lower respiratory sp ecimens  during the acute  phase of infection. The expected result is Negative. Fact Sheet for Patients:  StrictlyIdeas.no Fact Sheet for Healthcare Providers: BankingDealers.co.za This test is not yet approved or cleared by the [place] FDA and has been authorized for detection and/or diagnosis of SARS-CoV-2 by FDA under an Emergency Use Authorization (EUA).  This EUA will remain in effect (meaning this test can be used) for the duration of the COVID-19 declaration under Section 564(b)(1) of the Act, 21 U.S.C. section 360bbb-3(b)(1), unless the authorization is terminated or revoked sooner. Performed at [facility], [address], [place], [place] [address]      Radiology Studies: Dg Abd 2 Views  Result Date: 05/31/2019 CLINICAL DATA:  Abdominal pain and bloating. EXAM: ABDOMEN - 2 VIEW COMPARISON:  CT scan May 30, 2019 FINDINGS: No small bowel dilatation identified to suggest small bowel obstruction. Mild fecal loading in the ascending colon. Surgical clips in the pelvis. Cholecystectomy clips. No free air, portal venous gas, or pneumatosis identified. IMPRESSION: No acute abnormalities. Mild fecal loading in the ascending colon. No evidence of bowel obstruction. Electronically Signed   By: Dorise Bullion III M.D   On: 05/31/2019 09:32    Scheduled Meds: . amLODipine  5 mg Oral Daily  . aspirin  325 mg Oral Daily  . atorvastatin  20 mg Oral q1800  . linaclotide  290 mcg Oral QAC breakfast  . pantoprazole  40 mg Oral Daily   Continuous Infusions: . 0.9 % NaCl with KCl 20 mEq / L 100 mL/hr (06/01/19 0820)     LOS: 0 days    Time spent: 30 minutes.    Barton Dubois, MD [facility] Pager [phone]  06/01/2019, 12:45 PM

## 2019-06-02 DIAGNOSIS — K219 Gastro-esophageal reflux disease without esophagitis: Secondary | ICD-10-CM | POA: Diagnosis not present

## 2019-06-02 DIAGNOSIS — Z9049 Acquired absence of other specified parts of digestive tract: Secondary | ICD-10-CM | POA: Diagnosis not present

## 2019-06-02 DIAGNOSIS — R1010 Upper abdominal pain, unspecified: Secondary | ICD-10-CM | POA: Diagnosis not present

## 2019-06-02 DIAGNOSIS — J449 Chronic obstructive pulmonary disease, unspecified: Secondary | ICD-10-CM | POA: Diagnosis not present

## 2019-06-02 DIAGNOSIS — Z20828 Contact with and (suspected) exposure to other viral communicable diseases: Secondary | ICD-10-CM | POA: Diagnosis not present

## 2019-06-02 DIAGNOSIS — R69 Illness, unspecified: Secondary | ICD-10-CM | POA: Diagnosis not present

## 2019-06-02 DIAGNOSIS — Z720 Tobacco use: Secondary | ICD-10-CM | POA: Diagnosis not present

## 2019-06-02 DIAGNOSIS — K5900 Constipation, unspecified: Secondary | ICD-10-CM

## 2019-06-02 DIAGNOSIS — I10 Essential (primary) hypertension: Secondary | ICD-10-CM | POA: Diagnosis not present

## 2019-06-02 DIAGNOSIS — J9611 Chronic respiratory failure with hypoxia: Secondary | ICD-10-CM | POA: Diagnosis not present

## 2019-06-02 DIAGNOSIS — K56600 Partial intestinal obstruction, unspecified as to cause: Secondary | ICD-10-CM | POA: Diagnosis not present

## 2019-06-02 DIAGNOSIS — E782 Mixed hyperlipidemia: Secondary | ICD-10-CM | POA: Diagnosis not present

## 2019-06-02 DIAGNOSIS — Z79899 Other long term (current) drug therapy: Secondary | ICD-10-CM | POA: Diagnosis not present

## 2019-06-02 DIAGNOSIS — Z7982 Long term (current) use of aspirin: Secondary | ICD-10-CM | POA: Diagnosis not present

## 2019-06-02 DIAGNOSIS — R109 Unspecified abdominal pain: Secondary | ICD-10-CM | POA: Diagnosis not present

## 2019-06-02 MED ORDER — PANTOPRAZOLE SODIUM 40 MG PO TBEC: 40.0000 mg | DELAYED_RELEASE_TABLET | Freq: Every day | ORAL | 1 refills | Status: DC

## 2019-06-02 MED ORDER — LINACLOTIDE 290 MCG PO CAPS: 290.0000 ug | ORAL_CAPSULE | Freq: Every day | ORAL | 1 refills | Status: DC

## 2019-06-02 MED ORDER — ATORVASTATIN CALCIUM 20 MG PO TABS: 20.0000 mg | ORAL_TABLET | Freq: Every day | ORAL | 1 refills | Status: DC

## 2019-06-02 MED ORDER — OXYCODONE HCL 5 MG PO TABS: 5.0000 mg | ORAL_TABLET | Freq: Four times a day (QID) | ORAL | 0 refills | Status: AC | PRN

## 2019-06-02 NOTE — Progress Notes (Signed)
IV removed to right arm, 2x2 gauze and paper tape applied to site, patient tolerated well. Reviewed AVS with patient who verbalized understanding.  Patient awaiting her daughter's arrival to transport home, patient to be taken to short stay lobby via wheelchair.

## 2019-06-02 NOTE — Progress Notes (Signed)
[facility]  Reviewed CT from this admission and from last year 04/2018 with radiology, Dr. Jobe Igo.  No signs of thickening of the bowel on this CT. No need to repeat CTE that patient missed last year at this time.    Patient clinically eating and having Bms.   No need for follow up with surgery. Can follow up with PCP and GI. Continue Linzess regularly.  Curlene Labrum, MD [facility] [address] [place], [place] [address] [phone] (office)

## 2019-06-02 NOTE — Discharge Summary (Signed)
Physician Discharge Summary  Virginia Washington KDT:[id number] DOB: 1956/09/25 DOA: 05/30/2019  PCP: [name], MD  Admit date: 05/30/2019 Discharge date: 06/02/2019  Time spent:  35 minutes  Recommendations for Outpatient Follow-up:  1. Repeat basic metabolic panel to follow electrolytes and renal function 2. Reassess vital signs and further adjust antihypertensive regimen as needed 3. Continue assisting patient with smoking cessation.   Discharge Diagnoses:  Principal Problem:   Intractable abdominal pain Active Problems:   Tobacco abuse   Hypertension   Partial small bowel obstruction (HCC)   Hyperlipidemia   Constipation   GERD (gastroesophageal reflux disease)   PAD (peripheral artery disease) ([place]) -left iliac occlusion   Partial bowel obstruction (HCC)   Discharge Condition: Stable and improved.  Patient discharged home with instruction to follow-up with PCP and GI service as an outpatient.  Diet recommendation: Heart healthy diet.  Filed Weights   05/30/19 0327  Weight: 59 kg    History of present illness:  63 y.o. female with medical history significant of chronic back pain, COPD, history of DVT, headaches, urolithiasis, hyperlipidemia, hypertension, GERD, chronic constipation, history of SBO, history of partial SBO who is returning to the emergency department for the second time in the past 3 days due to persistent abdominal pain.  She has experienced more distention, has had some nausea, but denies emesis.  She denies fever, diarrhea, melena or hematochezia.  She denies flank pain, dysuria, frequency or hematuria.  Denies chest pain, palpitations, diaphoresis, PND, orthopnea or pitting edema of the lower extremities.  No dysuria, frequency or hematuria.  The patient states that the pain and overall symptoms feel similar to when she has bowel obstruction.  ED Course: Initial vital signs temperature 97.6 F, pulse 79, respiration 19, blood pressure 150/82 mmHg and O2  sat 98% on room air.  The patient received a 1000 mL NS bolus, morphine sulfate 4 mg IVP and ondansetron 4 mg IVP.  Patient states that her symptoms are better.  Her CBC was normal.  CMP shows a glucose of 104 mg/dL, and is otherwise unremarkable.  Lipase was 65.  CT abdomen/pelvis with contrast show probable developing small bowel obstruction.  There is focal dilated loop of small bowel with distal collapse.  Likely secondary to adhesions in the ventral abdomen.  Hospital Course:  1-Intractable abdominal pain: in the setting of partial SBO and severe constipation. -appears to be resolved at time of discharge. -Denies nausea, vomiting and is having good bowel movements.. -following general surgery rec's will discharge on daily linzess and and for patient to follow-up with PCP and gastroenterology service as an outpatient.   -Patient advised to keep yourself well hydrated.  2-chronic resp failure -Patient oxygen saturation has stabilized on room air -Currently no requiring supplementation. -Discharge without oxygen supplementation.  3-COPD -no wheezing currently -continue PRN albuterol  4-tobacco abuse -Extensive cessation counseling provided during this hospitalization. -nicotine patch declined  -Patient appears not to be ready to quit.  5-chronic back pain -continue pain meds -Patient tolerating well oral analgesics and reports pain to be well controlled.  6-GERD  -continue PPI  7-HLD -will continue Lipitor -Patient advised to follow heart healthy diet.  8-HTN -stable  -Resume oral amlodipine. -Follow vital signs and further adjust antihypertensive regimen as needed..  9-chronic constipation -discharge on daily Linzess  Procedures:  See below for x-ray reports  Consultations:  General surgery   Discharge Exam: Vitals:   06/01/19 2153 06/02/19 0533  BP: (!) 113/55 126/66  Pulse: Marland Kitchen)  56 (!) 51  Resp: 16 16  Temp: 98.3 F (36.8 C) 98.2 F (36.8 C)   SpO2: 100% 99%    General: Afebrile, no chest pain, no shortness of breath, no nausea, no vomiting.  Has tolerated diet and at this moment continues to have regular bowel movements. Cardiovascular: S1 and S2, no rubs, no gallops, no murmurs. Respiratory: Normal respiratory effort.  No using accessory muscles.  No wheezing, no crackles. Abdomen: Soft, nontender, nondistended, positive bowel sounds Extremities: No edema, no cyanosis, no clubbing.  Discharge Instructions   Discharge Instructions    Diet - low sodium heart healthy   Complete by: As directed    Discharge instructions   Complete by: As directed    Take medications as prescribed Keep yourself well-hydrated Arrange follow-up with PCP in 10 days Follow-up with gastroenterology service as previously instructed.     Allergies as of 06/02/2019   No Known Allergies     Medication List    TAKE these medications   amLODipine 5 MG tablet Commonly known as: NORVASC Take 5 mg by mouth daily.   aspirin EC 325 MG tablet Take 1 tablet (325 mg total) by mouth daily.   atorvastatin 20 MG tablet Commonly known as: LIPITOR Take 1 tablet (20 mg total) by mouth daily at 6 PM.   linaclotide 290 MCG Caps capsule Commonly known as: Linzess Take 1 capsule (290 mcg total) by mouth daily before breakfast. What changed:   when to take this  reasons to take this   oxyCODONE 5 MG immediate release tablet Commonly known as: Oxy IR/ROXICODONE Take 1 tablet (5 mg total) by mouth every 6 (six) hours as needed for up to 5 days for severe pain.   pantoprazole 40 MG tablet Commonly known as: PROTONIX Take 1 tablet (40 mg total) by mouth daily. Start taking on: June 03, 2019      No Known Allergies Follow-up Information    [name], MD. Go on 06/10/2019.   Specialty: Internal Medicine Why: Hospital Follow up Appointment at 11:00 AM. Please call within 24 hours of the appointment if you need to reschedule. Contact  information: [address] [place] [phone]           The results of significant diagnostics from this hospitalization (including imaging, microbiology, ancillary and laboratory) are listed below for reference.    Significant Diagnostic Studies: Ct Abdomen Pelvis W Contrast  Result Date: 05/30/2019 CLINICAL DATA:  Abdominal pain, acute, generalized. EXAM: CT ABDOMEN AND PELVIS WITH CONTRAST TECHNIQUE: Multidetector CT imaging of the abdomen and pelvis was performed using the standard protocol following bolus administration of intravenous contrast. CONTRAST:  182mL OMNIPAQUE IOHEXOL 300 MG/ML  SOLN COMPARISON:  CT of the abdomen and pelvis 05/27/2019 and 04/27/2018 FINDINGS: Lower chest: Lung bases demonstrate mild dependent atelectasis. Heart size is within normal limits. No significant pleural or pericardial effusion is present. Hepatobiliary: Liver is within normal limits. Mild intra and extrahepatic biliary dilation is stable following cholecystectomy. Pancreas: Mild prominence the pancreatic duct is stable. No obstructing lesion is present. No parenchymal mass is present. Spleen: Normal in size without focal abnormality. Adrenals/Urinary Tract: Adrenal glands are normal bilaterally. Punctate nonobstructing stone in the midportion of the right kidney is stable. No other significant stone or mass lesion is present. There is no obstruction. The urinary bladder is within normal limits. Stomach/Bowel: Stomach and duodenum are within normal limits. Small bowel adhesions are present. There is increased distension of essentially single loop  of small bowel in the mid abdomen measuring 2.8 cm maximally. More distal small bowel is collapsed. Terminal ileum is unremarkable. Appendix is visualized and normal. The ascending and transverse colon are within normal limits. Descending and sigmoid colon are mostly collapsed. Vascular/Lymphatic: Extensive atherosclerotic calcifications and  irregularity are noted in the aorta and branch vessels. 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 Fem-fem bypass graft is patent. Retroperitoneal node dissection is again noted. No significant retroperitoneal adenopathy is present. Reproductive: Status post hysterectomy. No adnexal masses. Other: No abdominal wall hernia or abnormality. No abdominopelvic ascites. Musculoskeletal: Vertebral body heights and alignment are maintained. No focal lytic or blastic lesions are present. Bony pelvis is within normal limits. The hips are located and within normal limits. IMPRESSION: 1. Probable developing small-bowel obstruction. Focal dilated loop of small bowel with distal collapse. This is likely secondary to adhesions the ventral abdomen. 2.  Aortic Atherosclerosis (ICD10-I70.0). Electronically Signed   By: San Morelle M.D.   On: 05/30/2019 05:37   Ct Abdomen Pelvis W Contrast  Result Date: 05/27/2019 CLINICAL DATA:  Upper abdominal pain. EXAM: CT ABDOMEN AND PELVIS WITH CONTRAST TECHNIQUE: Multidetector CT imaging of the abdomen and pelvis was performed using the standard protocol following bolus administration of intravenous contrast. CONTRAST:  19mL OMNIPAQUE IOHEXOL 300 MG/ML  SOLN COMPARISON:  04/27/2018. FINDINGS: Lower chest: No acute abnormality. Hepatobiliary: No focal liver abnormality is seen. Status post cholecystectomy. No biliary dilatation. Pancreas: Unremarkable. No pancreatic ductal dilatation or surrounding inflammatory changes. Findings are suspicious for pancreatic divisum. Spleen: Normal in size without focal abnormality. Adrenals/Urinary Tract: There is a punctate nonobstructing stone in the interpolar region of the right kidney. There is no hydronephrosis. The urinary bladder is unremarkable. Stomach/Bowel: There is an air-fluid level at the level of the gastric antrum without evidence of an obstructing lesion. The remaining portions of the stomach are unremarkable.  The colon is unremarkable. The appendix is normal. There is no evidence of a small-bowel obstruction. There are few fluid-filled loops of small bowel in the midline high abdomen. Vascular/Lymphatic: Aortic atherosclerosis. No enlarged abdominal or pelvic lymph nodes. There is complete occlusion of the left common iliac artery. There is a patent fem-fem bypass graft. Reproductive: Status post hysterectomy. No adnexal masses. Other: No abdominal wall hernia or abnormality. No abdominopelvic ascites. Musculoskeletal: No acute or significant osseous findings. IMPRESSION: 1. No acute abnormality detected. 2. Punctate nonobstructing stone in the interpolar region of the right kidney. 3. Additional chronic findings as above. Electronically Signed   By: Constance Holster M.D.   On: 05/27/2019 02:09   Dg Abd 2 Views  Result Date: 05/31/2019 CLINICAL DATA:  Abdominal pain and bloating. EXAM: ABDOMEN - 2 VIEW COMPARISON:  CT scan May 30, 2019 FINDINGS: No small bowel dilatation identified to suggest small bowel obstruction. Mild fecal loading in the ascending colon. Surgical clips in the pelvis. Cholecystectomy clips. No free air, portal venous gas, or pneumatosis identified. IMPRESSION: No acute abnormalities. Mild fecal loading in the ascending colon. No evidence of bowel obstruction. Electronically Signed   By: Dorise Bullion III M.D   On: 05/31/2019 09:32    Microbiology: Recent Results (from the past 240 hour(s))  SARS Coronavirus 2 (CEPHEID - Performed in [facility] hospital lab), Hosp Order     Status: None   Collection Time: 05/30/19  5:42 AM   Specimen: Nasopharyngeal Swab  Result Value Ref Range Status   SARS Coronavirus 2 NEGATIVE NEGATIVE Final    Comment: (  NOTE) If result is NEGATIVE SARS-CoV-2 target nucleic acids are NOT DETECTED. The SARS-CoV-2 RNA is generally detectable in upper and lower  respiratory specimens during the acute phase of infection. The lowest  concentration of SARS-CoV-2  viral copies this assay can detect is 250  copies / mL. A negative result does not preclude SARS-CoV-2 infection  and should not be used as the sole basis for treatment or other  patient management decisions.  A negative result may occur with  improper specimen collection / handling, submission of specimen other  than nasopharyngeal swab, presence of viral mutation(s) within the  areas targeted by this assay, and inadequate number of viral copies  (<250 copies / mL). A negative result must be combined with clinical  observations, patient history, and epidemiological information. If result is POSITIVE SARS-CoV-2 target nucleic acids are DETECTED. The SARS-CoV-2 RNA is generally detectable in upper and lower  respiratory specimens dur ing the acute phase of infection.  Positive  results are indicative of active infection with SARS-CoV-2.  Clinical  correlation with patient history and other diagnostic information is  necessary to determine patient infection status.  Positive results do  not rule out bacterial infection or co-infection with other viruses. If result is PRESUMPTIVE POSTIVE SARS-CoV-2 nucleic acids MAY BE PRESENT.   A presumptive positive result was obtained on the submitted specimen  and confirmed on repeat testing.  While 2019 novel coronavirus  (SARS-CoV-2) nucleic acids may be present in the submitted sample  additional confirmatory testing may be necessary for epidemiological  and / or clinical management purposes  to differentiate between  SARS-CoV-2 and other Sarbecovirus currently known to infect humans.  If clinically indicated additional testing with an alternate test  methodology [phone]) is advised. The SARS-CoV-2 RNA is generally  detectable in upper and lower respiratory sp ecimens during the acute  phase of infection. The expected result is Negative. Fact Sheet for Patients:  StrictlyIdeas.no Fact Sheet for Healthcare  Providers: BankingDealers.co.za This test is not yet approved or cleared by the [place] FDA and has been authorized for detection and/or diagnosis of SARS-CoV-2 by FDA under an Emergency Use Authorization (EUA).  This EUA will remain in effect (meaning this test can be used) for the duration of the COVID-19 declaration under Section 564(b)(1) of the Act, 21 U.S.C. section 360bbb-3(b)(1), unless the authorization is terminated or revoked sooner. Performed at [facility], [address], [place],  [address]      Labs: Basic Metabolic Panel: Recent Labs  Lab 05/26/19 2349 05/30/19 0342 05/31/19 0608 06/01/19 0621  NA 141 139 142 140  K 4.2 3.7 4.3 4.2  CL 107 109 112* 108  CO2 23 23 23 25   GLUCOSE 96 104* 84 88  BUN 12 13 7 6   CREATININE 0.94 0.86 0.80 0.84  CALCIUM 9.1 8.9 8.5* 8.9  MG  --  2.2  --   --   PHOS  --  4.2  --   --    Liver Function Tests: Recent Labs  Lab 05/26/19 2349 05/30/19 0342 05/31/19 0608  AST 17 13* 17  ALT 17 13 15   ALKPHOS 104 103 95  BILITOT 0.5 0.5 0.7  PROT 7.3 6.8 5.8*  ALBUMIN 4.3 4.0 3.4*   Recent Labs  Lab 05/26/19 2349 05/30/19 0342  LIPASE 47 65*   CBC: Recent Labs  Lab 05/26/19 2349 05/30/19 0342 05/31/19 0608 06/01/19 0621  WBC 9.1 7.5 5.1 4.5  NEUTROABS  --  2.5 2.0  --  HGB 13.6 13.3 12.3 13.2  HCT 43.9 42.6 41.1 44.0  MCV 89.2 88.2 90.3 91.3  PLT 216 216 214 199   Signed:  Barton Dubois MD.  [facility] 06/02/2019, 11:58 AM

## 2019-06-10 DIAGNOSIS — J449 Chronic obstructive pulmonary disease, unspecified: Secondary | ICD-10-CM | POA: Diagnosis not present

## 2019-06-10 DIAGNOSIS — I10 Essential (primary) hypertension: Secondary | ICD-10-CM | POA: Diagnosis not present

## 2019-06-10 DIAGNOSIS — K219 Gastro-esophageal reflux disease without esophagitis: Secondary | ICD-10-CM | POA: Diagnosis not present

## 2019-06-10 DIAGNOSIS — K56699 Other intestinal obstruction unspecified as to partial versus complete obstruction: Secondary | ICD-10-CM | POA: Diagnosis not present

## 2019-06-11 ENCOUNTER — Encounter: Payer: Self-pay | Admitting: Gastroenterology

## 2019-06-11 ENCOUNTER — Ambulatory Visit (INDEPENDENT_AMBULATORY_CARE_PROVIDER_SITE_OTHER): Payer: Medicare HMO | Admitting: Gastroenterology

## 2019-06-11 ENCOUNTER — Other Ambulatory Visit: Payer: Self-pay

## 2019-06-11 DIAGNOSIS — K219 Gastro-esophageal reflux disease without esophagitis: Secondary | ICD-10-CM

## 2019-06-11 DIAGNOSIS — K5901 Slow transit constipation: Secondary | ICD-10-CM

## 2019-06-11 DIAGNOSIS — R1319 Other dysphagia: Secondary | ICD-10-CM

## 2019-06-11 DIAGNOSIS — R1310 Dysphagia, unspecified: Secondary | ICD-10-CM

## 2019-06-11 NOTE — Progress Notes (Signed)
Subjective:    Patient ID: Virginia Washington, female    DOB: Jul 29, 1956, 63 y.o.   MRN: [id number]  Virginia Fire, MD   HPI HAVING one BM a day with Linzess. APPETITE: GOOD. ENERGY LEVEL: GOOD.  PT DENIES FEVER, CHILLS, HEMATOCHEZIA, HEMATEMESIS, nausea, vomiting, melena, diarrhea, CHEST PAIN, SHORTNESS OF BREATH,  CHANGE IN BOWEL IN HABITS, constipation, abdominal pain, problems swallowing, problems with sedation, OR heartburn or indigestion.  Past Medical History:  Diagnosis Date  . Chronic back pain   . Constipation   . Constipation   . COPD (chronic obstructive pulmonary disease) ([place])   . DVT (deep venous thrombosis) ([place])   . GERD (gastroesophageal reflux disease)   . Headache   . History of kidney stones   . Hyperlipidemia   . Hypertension    Past Surgical History:  Procedure Laterality Date  . ABDOMINAL AORTOGRAM W/LOWER EXTREMITY N/A 10/26/2017   Procedure: ABDOMINAL AORTOGRAM W/LOWER EXTREMITY;  Surgeon: Elam Dutch, MD;  Location: [facility];  Service: Cardiovascular: 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  Normal right aortoiliac vessels.  Three-vessel runoff bilaterally.  (Sluggish flow of the left side due to occlusion.  . ABDOMINAL HYSTERECTOMY    . CHOLECYSTECTOMY    . COLONOSCOPY  2007   Dr. Laural Golden: small internal hemorrhoids, otherwise normal. exam could be compromised due to quality of prep  . COLONOSCOPY N/A 02/15/2018   Dr. Oneida Alar: 2 simple adenomas, external and internal hemorrhoids. Next colonoscopy 2024-2026  . ESOPHAGOGASTRODUODENOSCOPY N/A 05/09/2018   Procedure: ESOPHAGOGASTRODUODENOSCOPY (EGD);  Surgeon: Danie Binder, MD;  [facility];  Service: Endoscopy;  Laterality: N/A;  2:15pm- Possible Pyloric Dilation  . FEMORAL-FEMORAL BYPASS GRAFT Bilateral 10/21/2018   Procedure: RIGHT TO LEFT FEMORAL ARTERY BYPASS GRAFT;  Surgeon: Elam Dutch, MD;  Location: [facility];  Service: Vascular;  Laterality: Bilateral;  . LOWER EXTREMITY ANGIOGRAPHY N/A 09/27/2018   Procedure: LOWER EXTREMITY ANGIOGRAPHY;  Surgeon: Elam Dutch, MD;  Location: [facility];  Service: Cardiovascular;  Laterality: N/A;  . NM MYOVIEW LTD  12/2014   [facility]: Normal Myoview.  Nonischemic.  Marland Kitchen TRANSTHORACIC ECHOCARDIOGRAM  12/2011   EF 55 to 60%.  No regional wall motion normality.  GRII DD.  No significant valve disease.   No Known Allergies  Current Outpatient Medications  Medication Sig    . amLODipine (NORVASC) 5 MG tablet Take 5 mg by mouth daily.    Marland Kitchen aspirin EC 325 MG tablet Take 1 tablet (325 mg total) by mouth daily.    Marland Kitchen LIPITOR 20 MG tablet Take 1 tablet (20 mg total) by mouth daily at 6 PM.    . LINZESS 290 MCG CAPS capsule Take 1 capsule (290 mcg total) by mouth daily before breakfast.    . PROTONIX 40 MG tablet Take 1 tablet (40 mg total) by mouth daily.     Review of Systems PER HPI OTHERWISE ALL SYSTEMS ARE NEGATIVE.    Objective:   Physical Exam Vitals signs reviewed.  Constitutional:      General: She is not in acute distress.    Appearance: She is well-developed.  HENT:     Head: Normocephalic and atraumatic.     Mouth/Throat:     Pharynx: No oropharyngeal exudate.  Eyes:     General: No scleral icterus.    Pupils: Pupils are equal, round, and reactive to light.  Neck:  Musculoskeletal: Normal range of motion and neck supple.  Cardiovascular:     Rate and Rhythm: Normal rate and regular rhythm.     Heart sounds: Normal heart sounds.  Pulmonary:     Effort: Pulmonary effort is normal. No respiratory distress.     Breath sounds: Normal breath sounds.  Abdominal:     General: Bowel sounds are normal. There is no distension.     Palpations: Abdomen is soft.     Tenderness: There is no abdominal tenderness.  Lymphadenopathy:     Cervical: No cervical adenopathy.  Neurological:     Mental Status: She is alert and  oriented to person, place, and time.           Assessment & Plan:

## 2019-06-11 NOTE — Progress Notes (Signed)
ON RECALL  °

## 2019-06-11 NOTE — Assessment & Plan Note (Signed)
SYMPTOMS CONTROLLED/RESOLVED.  CONTINUE TO MONITOR SYMPTOMS. FOLLOW UP IN 1 YEAR.  

## 2019-06-11 NOTE — Assessment & Plan Note (Signed)
SYMPTOMS CONTROLLED/RESOLVED.  DRINK WATER TO KEEP YOUR URINE LIGHT YELLOW. FOLLOW A HIGH FIBER DIET. AVOID ITEMS THAT CAUSE BLOATING & GAS. MAKE SURE THE FIBER IS SOFT SO IT WILL NOT CAUSE A BLOCKAGE IN YOUR SMALL BOWEL. AVOID RAW VEGETABLES/FRUITS. CONTINUE LINZESS. PLEASE CALL WITH QUESTIONS OR CONCERNS. FOLLOW UP IN 1 YEAR.

## 2019-06-11 NOTE — Assessment & Plan Note (Signed)
SYMPTOMS CONTROLLED/RESOLVED.  CONTINUE TO MONITOR SYMPTOMS. CONTINUE PROTONIX. TAKE 30 MINUTES PRIOR TO BREAKFAST. FOLLOW UP IN 1 YEAR.  

## 2019-06-11 NOTE — Patient Instructions (Signed)
DRINK WATER TO KEEP YOUR URINE LIGHT YELLOW.  FOLLOW A HIGH FIBER DIET. AVOID ITEMS THAT CAUSE BLOATING & GAS. MAKE SURE THE FIBER IS SOFT SO IT WILL NOT CAUSE A BLOCKAGE IN YOUR SMALL BOWEL. AVOID RAW VEGETABLES/FRUIT.  CONTINUE LINZESS.  PLEASE CALL WITH QUESTIONS OR CONCERNS.  FOLLOW UP IN 1 YEAR.   High-Fiber Diet A high-fiber diet changes your normal diet to include more whole grains, legumes, fruits, and vegetables. Changes in the diet involve replacing refined carbohydrates with unrefined foods. Functional fiber is fiber that has been isolated from the plant to provide a beneficial effect in the body.  PURPOSE  Increase stool bulk.   Ease and regulate bowel movements.   Lower cholesterol.   REDUCE RISK OF COLON CANCER  INDICATIONS THAT YOU NEED MORE FIBER  Constipation and hemorrhoids.   Uncomplicated diverticulosis (intestine condition) and irritable bowel syndrome.   Weight management.   As a protective measure against hardening of the arteries (atherosclerosis), diabetes, and cancer.   GUIDELINES FOR A HIGH FIBER DIET   Replace refined and processed grains with whole grains, canned fruits with fresh fruits, and incorporate other fiber sources. White rice, white breads, and most bakery goods contain little or no fiber.   Brown whole-grain rice, buckwheat oats, and many fruits and vegetables are all good sources of fiber. These include: COOKED broccoli, SOFT Brussels sprouts,COOKED cabbage,  beets, sweet potatoes, white potatoes (skin on), COOKED carrots, tomatoes, eggplant, squash, berries, AND SOFT fresh fruits: STRAWBERRIES, RASPBERRIES, BLUEBERRIES.   Cereals appear to be the richest source of fiber. Cereal fiber is found in whole grains and bran. Bran is the fiber-rich outer coat of cereal grain, which is largely removed in refining. In whole-grain cereals, the bran remains. In breakfast cereals, the largest amount of fiber is found in those with "bran" in their  names. The fiber content is sometimes indicated on the label.   You may need to include additional fruits and vegetables each day.   In baking, for 1 cup white flour, you may use the following substitutions:   1 cup whole-wheat flour minus 2 tablespoons.   1/2 cup white flour plus 1/2 cup whole-wheat flour.

## 2019-06-12 NOTE — Progress Notes (Signed)
cc'd to pcp 

## 2019-07-08 ENCOUNTER — Other Ambulatory Visit: Payer: Self-pay

## 2019-07-08 ENCOUNTER — Encounter (HOSPITAL_COMMUNITY): Payer: Self-pay

## 2019-07-08 ENCOUNTER — Emergency Department (HOSPITAL_COMMUNITY): Payer: Medicare HMO

## 2019-07-08 ENCOUNTER — Emergency Department (HOSPITAL_COMMUNITY)
Admission: EM | Admit: 2019-07-08 | Discharge: 2019-07-08 | Disposition: A | Discharge status: Home / Self Care | Payer: Medicare HMO | Attending: Emergency Medicine | Admitting: Emergency Medicine

## 2019-07-08 DIAGNOSIS — Z79899 Other long term (current) drug therapy: Secondary | ICD-10-CM | POA: Diagnosis not present

## 2019-07-08 DIAGNOSIS — S6991XA Unspecified injury of right wrist, hand and finger(s), initial encounter: Secondary | ICD-10-CM | POA: Diagnosis not present

## 2019-07-08 DIAGNOSIS — Z7982 Long term (current) use of aspirin: Secondary | ICD-10-CM | POA: Insufficient documentation

## 2019-07-08 DIAGNOSIS — Y999 Unspecified external cause status: Secondary | ICD-10-CM | POA: Insufficient documentation

## 2019-07-08 DIAGNOSIS — S52501A Unspecified fracture of the lower end of right radius, initial encounter for closed fracture: Secondary | ICD-10-CM | POA: Diagnosis not present

## 2019-07-08 DIAGNOSIS — J449 Chronic obstructive pulmonary disease, unspecified: Secondary | ICD-10-CM | POA: Insufficient documentation

## 2019-07-08 DIAGNOSIS — S63501A Unspecified sprain of right wrist, initial encounter: Secondary | ICD-10-CM | POA: Diagnosis not present

## 2019-07-08 DIAGNOSIS — R69 Illness, unspecified: Secondary | ICD-10-CM | POA: Diagnosis not present

## 2019-07-08 DIAGNOSIS — Y929 Unspecified place or not applicable: Secondary | ICD-10-CM | POA: Insufficient documentation

## 2019-07-08 DIAGNOSIS — X58XXXA Exposure to other specified factors, initial encounter: Secondary | ICD-10-CM | POA: Insufficient documentation

## 2019-07-08 DIAGNOSIS — I10 Essential (primary) hypertension: Secondary | ICD-10-CM | POA: Insufficient documentation

## 2019-07-08 DIAGNOSIS — F17210 Nicotine dependence, cigarettes, uncomplicated: Secondary | ICD-10-CM | POA: Diagnosis not present

## 2019-07-08 DIAGNOSIS — Y939 Activity, unspecified: Secondary | ICD-10-CM | POA: Insufficient documentation

## 2019-07-08 MED ORDER — DICLOFENAC SODIUM 1 % TD GEL: 2.0000 g | Freq: Three times a day (TID) | TRANSDERMAL | 0 refills | Status: DC

## 2019-07-08 MED ORDER — TRAMADOL HCL 50 MG PO TABS: 50.0000 mg | ORAL_TABLET | Freq: Four times a day (QID) | ORAL | 0 refills | Status: DC | PRN

## 2019-07-08 NOTE — ED Provider Notes (Signed)
[facility] EMERGENCY DEPARTMENT Provider Note   CSN: [id number] Arrival date & time: 07/08/19  0820     History   Chief Complaint Chief Complaint  Patient presents with  . Wrist Pain    HPI Virginia Washington is a 63 y.o. female.     HPI   Virginia Washington is a 63 y.o. female who presents to the Emergency Department complaining of gradually worsening right wrist pain.  Symptoms began yesterday.  She describes a throbbing, constant pain to the proximal right thumb that radiates to her wrist.  Pain is worse with movement of her thumb.  She denies known injury.  She applied an icy hot patch to her wrist last night which provided some relief.  She also complains of swelling to her proximal thumb and wrist.  She denies numbness of her fingers, fever, chills, redness of the joint, pain extending beyond her wrist, or recent repetitive movements.   Past Medical History:  Diagnosis Date  . Chronic back pain   . Constipation   . Constipation   . COPD (chronic obstructive pulmonary disease) ([place])   . DVT (deep venous thrombosis) ([place])   . GERD (gastroesophageal reflux disease)   . Headache   . History of kidney stones   . Hyperlipidemia   . Hypertension     Patient Active Problem List   Diagnosis Date Noted  . Intractable abdominal pain 05/30/2019  . Partial bowel obstruction ([place]) 05/30/2019  . Preop cardiovascular exam 09/23/2018  . PAD (peripheral artery disease) (HCC) -left iliac occlusion 09/23/2018  . Esophageal dysphagia   . SBO (small bowel obstruction) ([place]) 04/29/2018  . GERD (gastroesophageal reflux disease) 04/28/2018  . Hypokalemia 04/28/2018  . Pulmonary nodule 04/28/2018  . Hypernatremia 04/28/2018  . Enteritis 04/28/2018  . Encounter for screening colonoscopy 01/16/2018  . Abdominal pain 11/16/2017  . Constipation 11/16/2017  . Partial small bowel obstruction ([place]) 08/16/2016  . Hyperlipidemia 08/16/2016  . Chest pain 12/12/2014  . Hypertension  12/12/2014  . Tobacco abuse 10/29/2011    Past Surgical History:  Procedure Laterality Date  . ABDOMINAL AORTOGRAM W/LOWER EXTREMITY N/A 10/26/2017   Procedure: ABDOMINAL AORTOGRAM W/LOWER EXTREMITY;  Surgeon: Elam Dutch, MD;  Location: [place] [facility];  Service: Cardiovascular: 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  Normal right aortoiliac vessels.  Three-vessel runoff bilaterally.  (Sluggish flow of the left side due to occlusion.  . ABDOMINAL HYSTERECTOMY    . CHOLECYSTECTOMY    . COLONOSCOPY  2007   Dr. Laural Golden: small internal hemorrhoids, otherwise normal. exam could be compromised due to quality of prep  . COLONOSCOPY N/A 02/15/2018   Dr. Oneida Alar: 2 simple adenomas, external and internal hemorrhoids. Next colonoscopy 2024-2026  . ESOPHAGOGASTRODUODENOSCOPY N/A 05/09/2018   Procedure: ESOPHAGOGASTRODUODENOSCOPY (EGD);  Surgeon: Danie Binder, MD;  [facility];  Service: Endoscopy;  Laterality: N/A;  2:15pm- Possible Pyloric Dilation  . FEMORAL-FEMORAL BYPASS GRAFT Bilateral 10/21/2018   Procedure: RIGHT TO LEFT FEMORAL ARTERY BYPASS GRAFT;  Surgeon: Elam Dutch, MD;  Location: [place];  Service: Vascular;  Laterality: Bilateral;  . LOWER EXTREMITY ANGIOGRAPHY N/A 09/27/2018   Procedure: LOWER EXTREMITY ANGIOGRAPHY;  Surgeon: Elam Dutch, MD;  Location: [place] [facility] LAB;  Service: Cardiovascular;  Laterality: N/A;  . NM MYOVIEW LTD  12/2014   [facility]: Normal Myoview.  Nonischemic.  Marland Kitchen TRANSTHORACIC ECHOCARDIOGRAM  12/2011   EF 55 to 60%.  No  regional wall motion normality.  GRII DD.  No significant valve disease.     OB History    [place]      Para      Term      Preterm      AB      Living  3     SAB      TAB      Ectopic      Multiple      Live Births               Home Medications    Prior to Admission medications   Medication Sig Start Date End  Date Taking? Authorizing Provider  amLODipine (NORVASC) 5 MG tablet Take 5 mg by mouth daily.    [provider]  aspirin EC 325 MG tablet Take 1 tablet (325 mg total) by mouth daily. 09/27/18 09/27/19  Elam Dutch, MD  atorvastatin (LIPITOR) 20 MG tablet Take 1 tablet (20 mg total) by mouth daily at 6 PM. 06/02/19   Barton Dubois, MD  linaclotide [facility]) 290 MCG CAPS capsule Take 1 capsule (290 mcg total) by mouth daily before breakfast. 06/02/19   Barton Dubois, MD  pantoprazole (PROTONIX) 40 MG tablet Take 1 tablet (40 mg total) by mouth daily. 06/03/19   Barton Dubois, MD    Family History Family History  Problem Relation Age of Onset  . Hypertension Mother   . Peripheral Artery Disease Brother   . Colon cancer Neg Hx   . Colon polyps Neg Hx     Social History Social History   Tobacco Use  . Smoking status: Current Every Day Smoker    Packs/day: 0.50    Years: 40.00    Pack years: 20.00    Types: Cigarettes  . Smokeless tobacco: Never Used  Substance Use Topics  . Alcohol use: No    Alcohol/week: 0.0 standard drinks  . Drug use: No     Allergies   Patient has no known allergies.   Review of Systems Review of Systems  Constitutional: Negative for chills and fever.  Cardiovascular: Negative for chest pain.  Musculoskeletal: Positive for arthralgias (Right wrist pain and swelling) and joint swelling. Negative for neck pain.  Skin: Negative for color change and wound.  Neurological: Negative for dizziness, weakness and numbness.     Physical Exam Updated Vital Signs BP 121/72 (BP Location: Left Arm)   Pulse 69   Temp 97.7 F (36.5 C) (Oral)   Resp 16   Ht 5\' 2"  (1.575 m)   Wt 63 kg   SpO2 99%   BMI 25.42 kg/m   Physical Exam Vitals signs and nursing note reviewed.  Constitutional:      General: She is not in acute distress.    Appearance: Normal appearance. She is well-developed.  HENT:     Head: Atraumatic.  Neck:      Musculoskeletal: Normal range of motion. No muscular tenderness.  Cardiovascular:     Rate and Rhythm: Normal rate and regular rhythm.     Pulses: Normal pulses.  Pulmonary:     Effort: Pulmonary effort is normal.     Breath sounds: Normal breath sounds.  Musculoskeletal:        General: Swelling and tenderness present. No deformity or signs of injury.     Comments: Tenderness to palpation of the proximal right thumb and medial aspect of distal right wrist.  Mild edema noted.  No excessive warmth or erythema.  Pain is reproducible with flexion of the thumb.  No bony deformities.  Skin:    General: Skin is warm and dry.     Capillary Refill: Capillary refill takes less than 2 seconds.     Findings: No erythema or rash.  Neurological:     General: No focal deficit present.     Mental Status: She is alert.     Sensory: No sensory deficit.     Motor: No weakness or abnormal muscle tone.      ED Treatments / Results  Labs (all labs ordered are listed, but only abnormal results are displayed) Labs Reviewed - No data to display  EKG None  Radiology Dg Wrist Complete Right  Result Date: 07/08/2019 CLINICAL DATA:  Pain EXAM: RIGHT WRIST - COMPLETE 3+ VIEW COMPARISON:  08/01/2018 FINDINGS: No acute fracture or dislocation of the right wrist. The carpus is normally aligned. Mild arthrosis. There is a fracture fragment about the ulnar aspect of the distal radius which is corticated and nonacute, seen on prior examination dated 08/01/2018. IMPRESSION: No acute fracture or dislocation of the right wrist. The carpus is normally aligned. Mild arthrosis. There is a fracture fragment about the ulnar aspect of the distal radius which is corticated and nonacute, seen on prior examination dated 08/01/2018. Electronically Signed   By: Eddie Candle M.D.   On: 07/08/2019 09:53    Procedures Procedures (including critical care time)  Medications Ordered in ED Medications - No data to display    Initial Impression / Assessment and Plan / ED Course  I have reviewed the triage vital signs and the nursing notes.  Pertinent labs & imaging results that were available during my care of the patient were reviewed by me and considered in my medical decision making (see chart for details).        Patient with 1 day history of pain and mild swelling of her right wrist in proximal thumb.  X-ray is negative for acute dislocation or fracture.  Symptoms are likely related to degenerative changes of the joint.  No concerning symptoms for septic process.  NV intact.  Patient will be placed in a thumb spica splint and given orthopedic referral.   Final Clinical Impressions(s) / ED Diagnoses   Final diagnoses:  Sprain of right wrist, initial encounter    ED Discharge Orders    None       Bufford Lope 07/08/19 1102    Davonna Belling, MD 07/08/19 1452

## 2019-07-08 NOTE — Discharge Instructions (Addendum)
Wear the splint as needed for support to your thumb.  Keep your hand elevated to help alleviate swelling.  Follow-up with 1 of the orthopedic providers listed if not improving.

## 2019-07-08 NOTE — ED Triage Notes (Signed)
Pt reports right wrist pain that began last night. Unable to move wrist due to pain NO known injuries

## 2019-07-10 DIAGNOSIS — I739 Peripheral vascular disease, unspecified: Secondary | ICD-10-CM | POA: Diagnosis not present

## 2019-07-10 DIAGNOSIS — I10 Essential (primary) hypertension: Secondary | ICD-10-CM | POA: Diagnosis not present

## 2019-07-16 DIAGNOSIS — M654 Radial styloid tenosynovitis [de Quervain]: Secondary | ICD-10-CM | POA: Diagnosis not present

## 2019-08-10 DIAGNOSIS — I10 Essential (primary) hypertension: Secondary | ICD-10-CM | POA: Diagnosis not present

## 2019-08-10 DIAGNOSIS — J449 Chronic obstructive pulmonary disease, unspecified: Secondary | ICD-10-CM | POA: Diagnosis not present

## 2019-09-09 ENCOUNTER — Other Ambulatory Visit (INDEPENDENT_AMBULATORY_CARE_PROVIDER_SITE_OTHER): Payer: Self-pay | Admitting: Internal Medicine

## 2019-09-10 DIAGNOSIS — I10 Essential (primary) hypertension: Secondary | ICD-10-CM | POA: Diagnosis not present

## 2019-09-10 DIAGNOSIS — K219 Gastro-esophageal reflux disease without esophagitis: Secondary | ICD-10-CM | POA: Diagnosis not present

## 2019-09-12 ENCOUNTER — Other Ambulatory Visit: Payer: Self-pay | Admitting: Gastroenterology

## 2019-10-04 ENCOUNTER — Other Ambulatory Visit: Payer: Self-pay

## 2019-10-04 ENCOUNTER — Emergency Department (HOSPITAL_COMMUNITY)
Admission: EM | Admit: 2019-10-04 | Discharge: 2019-10-04 | Disposition: A | Discharge status: Home / Self Care | Payer: Medicare HMO | Attending: Emergency Medicine | Admitting: Emergency Medicine

## 2019-10-04 ENCOUNTER — Emergency Department (HOSPITAL_COMMUNITY): Payer: Medicare HMO

## 2019-10-04 ENCOUNTER — Encounter (HOSPITAL_COMMUNITY): Payer: Self-pay

## 2019-10-04 DIAGNOSIS — S8252XA Displaced fracture of medial malleolus of left tibia, initial encounter for closed fracture: Secondary | ICD-10-CM | POA: Diagnosis not present

## 2019-10-04 DIAGNOSIS — Z79899 Other long term (current) drug therapy: Secondary | ICD-10-CM | POA: Diagnosis not present

## 2019-10-04 DIAGNOSIS — W010XXA Fall on same level from slipping, tripping and stumbling without subsequent striking against object, initial encounter: Secondary | ICD-10-CM | POA: Insufficient documentation

## 2019-10-04 DIAGNOSIS — Y9389 Activity, other specified: Secondary | ICD-10-CM | POA: Diagnosis not present

## 2019-10-04 DIAGNOSIS — Y999 Unspecified external cause status: Secondary | ICD-10-CM | POA: Diagnosis not present

## 2019-10-04 DIAGNOSIS — J449 Chronic obstructive pulmonary disease, unspecified: Secondary | ICD-10-CM | POA: Diagnosis not present

## 2019-10-04 DIAGNOSIS — Y929 Unspecified place or not applicable: Secondary | ICD-10-CM | POA: Insufficient documentation

## 2019-10-04 DIAGNOSIS — F17210 Nicotine dependence, cigarettes, uncomplicated: Secondary | ICD-10-CM | POA: Diagnosis not present

## 2019-10-04 DIAGNOSIS — I10 Essential (primary) hypertension: Secondary | ICD-10-CM | POA: Insufficient documentation

## 2019-10-04 DIAGNOSIS — R69 Illness, unspecified: Secondary | ICD-10-CM | POA: Diagnosis not present

## 2019-10-04 DIAGNOSIS — S99812A Other specified injuries of left ankle, initial encounter: Secondary | ICD-10-CM | POA: Diagnosis present

## 2019-10-04 DIAGNOSIS — M25572 Pain in left ankle and joints of left foot: Secondary | ICD-10-CM | POA: Diagnosis not present

## 2019-10-04 DIAGNOSIS — M7989 Other specified soft tissue disorders: Secondary | ICD-10-CM | POA: Diagnosis not present

## 2019-10-04 MED ORDER — NAPROXEN 250 MG PO TABS
500.0000 mg | ORAL_TABLET | Freq: Once | ORAL | Status: AC
  Administered 2019-10-04: 22:00:00 500 mg via ORAL
  Filled 2019-10-04: qty 2

## 2019-10-04 MED ORDER — NAPROXEN 500 MG PO TABS: 500.0000 mg | ORAL_TABLET | Freq: Two times a day (BID) | ORAL | 0 refills | Status: DC

## 2019-10-04 NOTE — Discharge Instructions (Addendum)
Please read and follow all provided instructions.  You have been seen today for an injury to your left ankle.  The xray showed a small avulsion fracture to the medial malleolus- meaning a small chip off the bone on the inside of your ankle that could be new related to the injury you just had or old from a prior injury. We have placed you in a walking boot to stabilize this- please wear this at all times until you have followed up with Dr. Aline Brochure the orthopedic surgeon in your discharge instructions or your primary care provider.   Home care instructions: -- *PRICE in the first 24-48 hours after injury: Protect (with brace, splint, sling), if given by your provider Rest Ice- Do not apply ice pack directly to your skin, place towel or similar between your skin and ice/ice pack. Apply ice for 20 min, then remove for 40 min while awake Compression- Wear brace, elastic bandage, splint as directed by your provider Elevate affected extremity above the level of your heart when not walking around for the first 24-48 hours   Medications:  - Naproxen is a nonsteroidal anti-inflammatory medication that will help with pain and swelling. Be sure to take this medication as prescribed with food, 1 pill every 12 hours,  It should be taken with food, as it can cause stomach upset, and more seriously, stomach bleeding. Do not take other nonsteroidal anti-inflammatory medications with this such as Advil, Motrin, Aleve, Mobic, Goodie Powder, or Motrin.    You make take Tylenol per over the counter dosing with these medications.   We have prescribed you new medication(s) today. Discuss the medications prescribed today with your pharmacist as they can have adverse effects and interactions with your other medicines including over the counter and prescribed medications. Seek medical evaluation if you start to experience new or abnormal symptoms after taking one of these medicines, seek care immediately if you start to  experience difficulty breathing, feeling of your throat closing, facial swelling, or rash as these could be indications of a more serious allergic reaction   Follow-up instructions: Please follow-up with orthopedic surgeon Dr. Aline Brochure within 5 days for re-evaluation & further management.   Return instructions:  Please return if your digits or extremity are numb or tingling, appear gray or blue, or you have severe pain (also elevate the extremity and loosen splint or wrap if you were given one) Please return if you have redness or fevers.  Please return to the Emergency Department if you experience worsening symptoms.  Please return if you have any other emergent concerns. Additional Information:  Your vital signs today were: BP (!) 146/82 (BP Location: Right Arm)    Pulse 96    Temp 98.3 F (36.8 C) (Oral)    Resp 18    Ht 5\' 2"  (1.575 m)    Wt 62.6 kg    SpO2 100%    BMI 25.24 kg/m  If your blood pressure (BP) was elevated above 135/85 this visit, please have this repeated by your doctor within one month. ---------------

## 2019-10-04 NOTE — ED Provider Notes (Signed)
[facility] EMERGENCY DEPARTMENT Provider Note   CSN: [id number] Arrival date & time: 10/04/19  2035     History   Chief Complaint Chief Complaint  Patient presents with  . Ankle Pain    left    HPI Virginia Washington is a 63 y.o. female with a hx of COPD, GERD, HTN, hyperlipidemia, PAD, prior femoral-femoral bypass graft bilaterally & prior DVT not on anticoagulation who presents to the ED s/p fall today with complaints of L ankle pain. Patient states she was walking, tripped, & inverted her L ankle. Denies head injury or LOC associated w/ the event. States she is having pain & swelling only to the L ankle. Pain is moderate, worse with movement, no alleviating factors. Denies numbness, tingling, or weakness.      HPI  Past Medical History:  Diagnosis Date  . Chronic back pain   . Constipation   . Constipation   . COPD (chronic obstructive pulmonary disease) ([place])   . DVT (deep venous thrombosis) ([place])   . GERD (gastroesophageal reflux disease)   . Headache   . History of kidney stones   . Hyperlipidemia   . Hypertension     Patient Active Problem List   Diagnosis Date Noted  . Intractable abdominal pain 05/30/2019  . Partial bowel obstruction ([place]) 05/30/2019  . Preop cardiovascular exam 09/23/2018  . PAD (peripheral artery disease) (HCC) -left iliac occlusion 09/23/2018  . Esophageal dysphagia   . SBO (small bowel obstruction) ([place]) 04/29/2018  . GERD (gastroesophageal reflux disease) 04/28/2018  . Hypokalemia 04/28/2018  . Pulmonary nodule 04/28/2018  . Hypernatremia 04/28/2018  . Enteritis 04/28/2018  . Encounter for screening colonoscopy 01/16/2018  . Abdominal pain 11/16/2017  . Constipation 11/16/2017  . Partial small bowel obstruction ([place]) 08/16/2016  . Hyperlipidemia 08/16/2016  . Chest pain 12/12/2014  . Hypertension 12/12/2014  . Tobacco abuse 10/29/2011    Past Surgical History:  Procedure Laterality Date  . ABDOMINAL AORTOGRAM W/LOWER  EXTREMITY N/A 10/26/2017   Procedure: ABDOMINAL AORTOGRAM W/LOWER EXTREMITY;  Surgeon: Elam Dutch, MD;  Location: [place] CV LAB;  Service: Cardiovascular: 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  Normal right aortoiliac vessels.  Three-vessel runoff bilaterally.  (Sluggish flow of the left side due to occlusion.  . ABDOMINAL HYSTERECTOMY    . CHOLECYSTECTOMY    . COLONOSCOPY  2007   Dr. Laural Golden: small internal hemorrhoids, otherwise normal. exam could be compromised due to quality of prep  . COLONOSCOPY N/A 02/15/2018   Dr. Oneida Alar: 2 simple adenomas, external and internal hemorrhoids. Next colonoscopy 2024-2026  . ESOPHAGOGASTRODUODENOSCOPY N/A 05/09/2018   Procedure: ESOPHAGOGASTRODUODENOSCOPY (EGD);  Surgeon: Danie Binder, MD;  [facility];  Service: Endoscopy;  Laterality: N/A;  2:15pm- Possible Pyloric Dilation  . FEMORAL-FEMORAL BYPASS GRAFT Bilateral 10/21/2018   Procedure: RIGHT TO LEFT FEMORAL ARTERY BYPASS GRAFT;  Surgeon: Elam Dutch, MD;  Location: [place];  Service: Vascular;  Laterality: Bilateral;  . LOWER EXTREMITY ANGIOGRAPHY N/A 09/27/2018   Procedure: LOWER EXTREMITY ANGIOGRAPHY;  Surgeon: Elam Dutch, MD;  Location: [place] [facility];  Service: Cardiovascular;  Laterality: N/A;  . NM MYOVIEW LTD  12/2014   [facility]: Normal Myoview.  Nonischemic.  Marland Kitchen TRANSTHORACIC ECHOCARDIOGRAM  12/2011   EF 55 to 60%.  No regional wall motion normality.  GRII DD.  No significant valve disease.     OB History    [place]  Para      Term      Preterm      AB      Living  3     SAB      TAB      Ectopic      Multiple      Live Births               Home Medications    Prior to Admission medications   Medication Sig Start Date End Date Taking? Authorizing Provider  amLODipine (NORVASC) 5 MG tablet Take 5 mg by mouth daily.    [provider]   atorvastatin (LIPITOR) 20 MG tablet Take 1 tablet (20 mg total) by mouth daily at 6 PM. 06/02/19   Barton Dubois, MD  diclofenac sodium (VOLTAREN) 1 % GEL Apply 2 g topically 3 (three) times daily. 07/08/19   Kem Parkinson, PA-C  LINZESS 290 MCG CAPS capsule TAKE 1 CAPSULE(290 MCG) BY MOUTH DAILY BEFORE BREAKFAST 09/15/19   Mahala Menghini, PA-C  pantoprazole (PROTONIX) 40 MG tablet Take 1 tablet (40 mg total) by mouth daily. 06/03/19   Barton Dubois, MD  traMADol (ULTRAM) 50 MG tablet Take 1 tablet (50 mg total) by mouth every 6 (six) hours as needed. 07/08/19   Kem Parkinson, PA-C    Family History Family History  Problem Relation Age of Onset  . Hypertension Mother   . Peripheral Artery Disease Brother   . Colon cancer Neg Hx   . Colon polyps Neg Hx     Social History Social History   Tobacco Use  . Smoking status: Current Every Day Smoker    Packs/day: 0.50    Years: 40.00    Pack years: 20.00    Types: Cigarettes  . Smokeless tobacco: Never Used  Substance Use Topics  . Alcohol use: No    Alcohol/week: 0.0 standard drinks  . Drug use: No     Allergies   Patient has no known allergies.   Review of Systems Review of Systems  Constitutional: Negative for chills and fever.  Respiratory: Negative for shortness of breath.   Cardiovascular: Negative for chest pain.  Musculoskeletal: Positive for arthralgias and joint swelling. Negative for back pain and neck pain.  Skin: Negative for color change and wound.  Neurological: Negative for dizziness, weakness, light-headedness and numbness.     Physical Exam Updated Vital Signs BP (!) 146/82 (BP Location: Right Arm)   Pulse 96   Temp 98.3 F (36.8 C) (Oral)   Resp 18   Ht 5\' 2"  (1.575 m)   Wt 62.6 kg   SpO2 100%   BMI 25.24 kg/m   Physical Exam Vitals signs and nursing note reviewed.  Constitutional:      General: She is not in acute distress.    Appearance: She is not ill-appearing or toxic-appearing.   HENT:     Head: Normocephalic and atraumatic.  Cardiovascular:     Pulses:          Dorsalis pedis pulses are 2+ on the right side and 2+ on the left side.       Posterior tibial pulses are 2+ on the right side and 2+ on the left side.  Pulmonary:     Effort: Pulmonary effort is normal.  Musculoskeletal:     Comments: Upper extremities: Intact active range of motion pulmonary no point/focal bony tenderness Back: No midline tenderness to the cervical, thoracic, lumbar, sacral/coccygeal region.  No palpable step-off. Lower extremities:  No obvious deformity, erythema, ecchymosis, warmth, or open wounds.  Patient has soft tissue swelling noted to the left lateral ankle.  Patient has intact AROM to bilateral hips, knees, ankles, and all digits. Tender to palpation to the medial and lateral malleolus and medial and lateral ankle ligaments.  Seems more tender to palpation laterally.  Otherwise nontender.  No tenderness to the base of the fifth, the navicular bone, or the fibular head.  Skin:    General: Skin is warm and dry.     Capillary Refill: Capillary refill takes less than 2 seconds.  Neurological:     Mental Status: She is alert.     Comments: Alert. Clear speech. Sensation grossly intact to bilateral lower extremities. 5/5 strength with plantar/dorsiflexion bilaterally. Patient ambulatory with mildly antalgic gait.   Psychiatric:        Mood and Affect: Mood normal.        Behavior: Behavior normal.      ED Treatments / Results  Labs (all labs ordered are listed, but only abnormal results are displayed) Labs Reviewed - No data to display  EKG None  Radiology Dg Ankle Complete Left  Result Date: 10/04/2019 CLINICAL DATA:  Patient twisted her left ankle.  Pain and swelling. EXAM: LEFT ANKLE COMPLETE - 3+ VIEW COMPARISON:  None. FINDINGS: There is a calcification distal to the medial malleolus. Enthesopathic changes are seen at the posterior calcaneus. No acute fractures are  otherwise seen. No significant soft tissue swelling is noted. IMPRESSION: A calcification in the soft tissues just distal to the medial malleolus is consistent with an age indeterminate avulsion injury. There is no definite soft tissue swelling in this region suggesting this may be nonacute. Recommend clinical correlation. No other abnormalities are identified. Electronically Signed   By: Dorise Bullion III M.D   On: 10/04/2019 21:04    Procedures Procedures (including critical care time)  SPLINT APPLICATION Date/Time: 123XX123 PM Authorized by: Kennith Maes Consent: Verbal consent obtained. Risks and benefits: risks, benefits and alternatives were discussed Consent given by: patient Splint applied by: RN Location details: LLE Splint type: Cam walker Supplies used: Cam walker.  Post-procedure: The splinted body part was neurovascularly unchanged following the procedure. Patient tolerance: Patient tolerated the procedure well with no immediate complications.   Medications Ordered in ED Medications - No data to display   Initial Impression / Assessment and Plan / ED Course  I have reviewed the triage vital signs and the nursing notes.  Pertinent labs & imaging results that were available during my care of the patient were reviewed by me and considered in my medical decision making (see chart for details).    Patient presents to the ED with complaints of pain to the left ankle s/p injury earlier today. Exam with mild swelling, but no  obvious deformity or open wounds. ROM intact. Tender to palpation to medial/lateral malleolus/ankle ligaments. NVI distally. Xray with age indeterminate avulsion injury to medial malleolus- given patient is tender to palpation in this location concern for possible acute injury- will place in CAM walker. Naproxen prescription for pain (last renal function on chart review WNL and patient states she is not taking NSAIDs). PRICE. Orthopedics follow up. I  discussed results, treatment plan, need for follow-up, and return precautions with the patient. Provided opportunity for questions, patient confirmed understanding and are in agreement with plan.     Final Clinical Impressions(s) / ED Diagnoses   Final diagnoses:  Closed avulsion fracture of medial malleolus of left  tibia, initial encounter    ED Discharge Orders         Ordered    naproxen (NAPROSYN) 500 MG tablet  2 times daily     10/04/19 2126           Amaryllis Dyke, PA-C 10/04/19 2132    Fredia Sorrow, MD 10/14/19 [phone]

## 2019-10-04 NOTE — ED Notes (Signed)
Pt reports she twisted her R ankle   She ambulates with a slight limp  sensation intact

## 2019-10-04 NOTE — ED Triage Notes (Signed)
Pt twisted her left ankle. C/o pain and swelling. Ambulatory in triage.

## 2019-10-07 DIAGNOSIS — M654 Radial styloid tenosynovitis [de Quervain]: Secondary | ICD-10-CM | POA: Diagnosis not present

## 2019-10-08 DIAGNOSIS — I739 Peripheral vascular disease, unspecified: Secondary | ICD-10-CM | POA: Diagnosis not present

## 2019-10-08 DIAGNOSIS — I10 Essential (primary) hypertension: Secondary | ICD-10-CM | POA: Diagnosis not present

## 2019-10-08 DIAGNOSIS — K5909 Other constipation: Secondary | ICD-10-CM | POA: Diagnosis not present

## 2019-10-09 DIAGNOSIS — M25572 Pain in left ankle and joints of left foot: Secondary | ICD-10-CM | POA: Diagnosis not present

## 2019-10-09 DIAGNOSIS — Z23 Encounter for immunization: Secondary | ICD-10-CM | POA: Diagnosis not present

## 2019-10-09 DIAGNOSIS — J449 Chronic obstructive pulmonary disease, unspecified: Secondary | ICD-10-CM | POA: Diagnosis not present

## 2019-10-31 IMAGING — CR DG CHEST 1V PORT
1 series · 1 of 1 positions shown · non-contrast
Comparison: 02/17/2018

CLINICAL DATA: Cough wheeze and dyspnea

EXAM:
PORTABLE CHEST 1 VIEW

[portable]
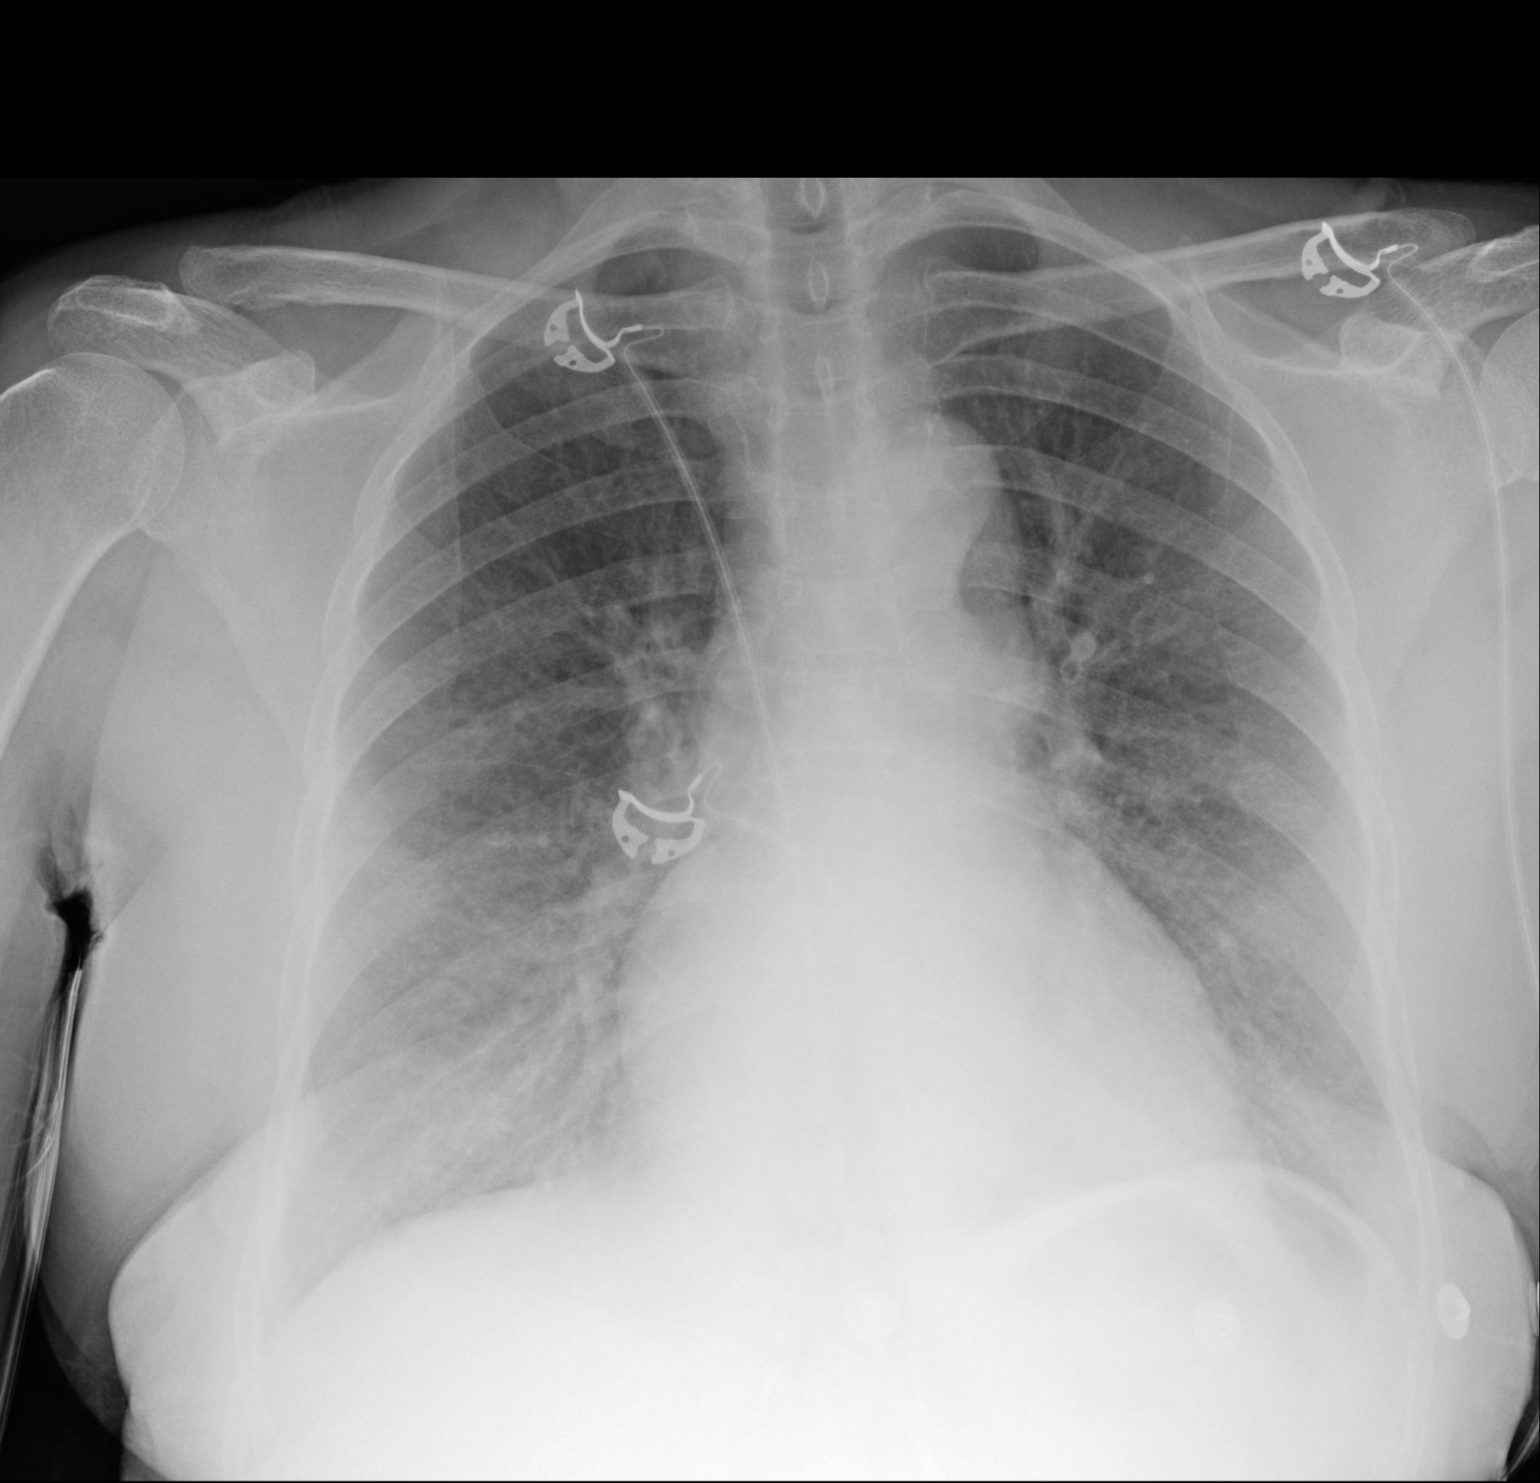

[1 of 1 positions shown; findings below may reference images not displayed]

FINDINGS: Mild cardiac enlargement with central vascular congestion. No
effusion or pneumothorax. No acute osseous abnormality.
IMPRESSION: Mild cardiac enlargement with central vascular congestion since
prior. No acute pneumonic consolidation.

## 2019-11-05 ENCOUNTER — Other Ambulatory Visit: Payer: Self-pay

## 2019-11-05 DIAGNOSIS — Z20822 Contact with and (suspected) exposure to covid-19: Secondary | ICD-10-CM

## 2019-11-06 LAB — NOVEL CORONAVIRUS, NAA: SARS-CoV-2, NAA: NOT DETECTED

## 2019-11-09 DIAGNOSIS — J449 Chronic obstructive pulmonary disease, unspecified: Secondary | ICD-10-CM | POA: Diagnosis not present

## 2019-11-09 DIAGNOSIS — I10 Essential (primary) hypertension: Secondary | ICD-10-CM | POA: Diagnosis not present

## 2019-12-11 DIAGNOSIS — Z1389 Encounter for screening for other disorder: Secondary | ICD-10-CM | POA: Diagnosis not present

## 2019-12-11 DIAGNOSIS — Z1331 Encounter for screening for depression: Secondary | ICD-10-CM | POA: Diagnosis not present

## 2019-12-11 DIAGNOSIS — I10 Essential (primary) hypertension: Secondary | ICD-10-CM | POA: Diagnosis not present

## 2019-12-11 DIAGNOSIS — I739 Peripheral vascular disease, unspecified: Secondary | ICD-10-CM | POA: Diagnosis not present

## 2019-12-11 DIAGNOSIS — F17210 Nicotine dependence, cigarettes, uncomplicated: Secondary | ICD-10-CM | POA: Diagnosis not present

## 2019-12-11 DIAGNOSIS — Z0001 Encounter for general adult medical examination with abnormal findings: Secondary | ICD-10-CM | POA: Diagnosis not present

## 2019-12-11 DIAGNOSIS — J449 Chronic obstructive pulmonary disease, unspecified: Secondary | ICD-10-CM | POA: Diagnosis not present

## 2019-12-11 DIAGNOSIS — R69 Illness, unspecified: Secondary | ICD-10-CM | POA: Diagnosis not present

## 2019-12-15 DIAGNOSIS — E785 Hyperlipidemia, unspecified: Secondary | ICD-10-CM | POA: Diagnosis not present

## 2019-12-15 DIAGNOSIS — I10 Essential (primary) hypertension: Secondary | ICD-10-CM | POA: Diagnosis not present

## 2019-12-15 DIAGNOSIS — K219 Gastro-esophageal reflux disease without esophagitis: Secondary | ICD-10-CM | POA: Diagnosis not present

## 2020-01-11 DIAGNOSIS — K219 Gastro-esophageal reflux disease without esophagitis: Secondary | ICD-10-CM | POA: Diagnosis not present

## 2020-01-11 DIAGNOSIS — I10 Essential (primary) hypertension: Secondary | ICD-10-CM | POA: Diagnosis not present

## 2020-01-14 DIAGNOSIS — G5602 Carpal tunnel syndrome, left upper limb: Secondary | ICD-10-CM | POA: Insufficient documentation

## 2020-01-14 DIAGNOSIS — M79642 Pain in left hand: Secondary | ICD-10-CM | POA: Diagnosis not present

## 2020-01-14 DIAGNOSIS — M47812 Spondylosis without myelopathy or radiculopathy, cervical region: Secondary | ICD-10-CM | POA: Insufficient documentation

## 2020-02-11 DIAGNOSIS — K219 Gastro-esophageal reflux disease without esophagitis: Secondary | ICD-10-CM | POA: Diagnosis not present

## 2020-02-11 DIAGNOSIS — I10 Essential (primary) hypertension: Secondary | ICD-10-CM | POA: Diagnosis not present

## 2020-02-28 ENCOUNTER — Other Ambulatory Visit: Payer: Self-pay

## 2020-02-28 ENCOUNTER — Encounter (HOSPITAL_COMMUNITY): Payer: Self-pay | Admitting: Emergency Medicine

## 2020-02-28 ENCOUNTER — Emergency Department (HOSPITAL_COMMUNITY)
Admission: EM | Admit: 2020-02-28 | Discharge: 2020-02-28 | Disposition: A | Discharge status: Home / Self Care | Payer: Medicare HMO | Attending: Emergency Medicine | Admitting: Emergency Medicine

## 2020-02-28 ENCOUNTER — Emergency Department (HOSPITAL_COMMUNITY): Payer: Medicare HMO

## 2020-02-28 DIAGNOSIS — I10 Essential (primary) hypertension: Secondary | ICD-10-CM | POA: Diagnosis not present

## 2020-02-28 DIAGNOSIS — J449 Chronic obstructive pulmonary disease, unspecified: Secondary | ICD-10-CM | POA: Insufficient documentation

## 2020-02-28 DIAGNOSIS — F17210 Nicotine dependence, cigarettes, uncomplicated: Secondary | ICD-10-CM | POA: Diagnosis not present

## 2020-02-28 DIAGNOSIS — Z9049 Acquired absence of other specified parts of digestive tract: Secondary | ICD-10-CM | POA: Insufficient documentation

## 2020-02-28 DIAGNOSIS — R69 Illness, unspecified: Secondary | ICD-10-CM | POA: Diagnosis not present

## 2020-02-28 DIAGNOSIS — R1013 Epigastric pain: Secondary | ICD-10-CM

## 2020-02-28 DIAGNOSIS — R109 Unspecified abdominal pain: Secondary | ICD-10-CM | POA: Diagnosis present

## 2020-02-28 LAB — COMPREHENSIVE METABOLIC PANEL
ALT: 15 U/L (ref 0–44)
AST: 17 U/L (ref 15–41)
Albumin: 4 g/dL (ref 3.5–5.0)
Alkaline Phosphatase: 111 U/L (ref 38–126)
Anion gap: 8 (ref 5–15)
BUN: 9 mg/dL (ref 8–23)
CO2: 24 mmol/L (ref 22–32)
Calcium: 8.8 mg/dL — ABNORMAL LOW (ref 8.9–10.3)
Chloride: 107 mmol/L (ref 98–111)
Creatinine, Ser: 0.9 mg/dL (ref 0.44–1.00)
GFR calc Af Amer: >60 mL/min (ref >60)
GFR calc non Af Amer: >60 mL/min (ref >60)
Glucose, Bld: 117 mg/dL — ABNORMAL HIGH (ref 70–99)
Potassium: 3.5 mmol/L (ref 3.5–5.1)
Sodium: 139 mmol/L (ref 135–145)
Total Bilirubin: 0.7 mg/dL (ref 0.3–1.2)
Total Protein: 7.2 g/dL (ref 6.5–8.1)

## 2020-02-28 LAB — CBC
HCT: 46.3 % — ABNORMAL HIGH (ref 36.0–46.0)
Hemoglobin: 14.5 g/dL (ref 12.0–15.0)
MCH: 27.5 pg (ref 26.0–34.0)
MCHC: 31.3 g/dL (ref 30.0–36.0)
MCV: 87.9 fL (ref 80.0–100.0)
Platelets: 217 10*3/uL (ref 150–400)
RBC: 5.27 MIL/uL — ABNORMAL HIGH (ref 3.87–5.11)
RDW: 14.9 % (ref 11.5–15.5)
WBC: 11.7 10*3/uL — ABNORMAL HIGH (ref 4.0–10.5)
nRBC: 0 % (ref 0.0–0.2)

## 2020-02-28 LAB — LIPASE, BLOOD: Lipase: 36 U/L (ref 11–51)

## 2020-02-28 MED ORDER — DICYCLOMINE HCL 20 MG PO TABS: 20.0000 mg | ORAL_TABLET | Freq: Three times a day (TID) | ORAL | 0 refills | Status: DC | PRN

## 2020-02-28 MED ORDER — ALUM & MAG HYDROXIDE-SIMETH 200-200-20 MG/5ML PO SUSP
15.0000 mL | Freq: Once | ORAL | Status: AC
  Administered 2020-02-28: 15 mL via ORAL
  Filled 2020-02-28: qty 30

## 2020-02-28 MED ORDER — DICYCLOMINE HCL 10 MG PO CAPS
10.0000 mg | ORAL_CAPSULE | Freq: Once | ORAL | Status: AC
  Administered 2020-02-28: 10 mg via ORAL
  Filled 2020-02-28: qty 1

## 2020-02-28 NOTE — Discharge Instructions (Signed)

## 2020-02-28 NOTE — ED Notes (Signed)
Reported abd pain for 1 hour  Hx of obstruction  Constipation   Here for eval

## 2020-02-28 NOTE — ED Triage Notes (Signed)
Was at a gambling establishment and had abd pain  Bystander caller [facility]  No IV  VSS

## 2020-02-28 NOTE — ED Notes (Signed)
Pt's contact info Darene Lamer (daughter) [phone]

## 2020-02-28 NOTE — ED Provider Notes (Signed)
Emergency Department Provider Note   I have reviewed the triage vital signs and the nursing notes.   HISTORY  Chief Complaint Abdominal Pain   HPI Virginia Washington is a 64 y.o. female with PMH of COPD, HTN, HLD, and prior pSBO with constipation presents to the emergency department with acute onset burning abdominal pain radiating to the back. Pain feels like a "burning" pain. Symptoms began just prior to ED arrival.  Patient called 911 when symptoms began.  She denies any chest pain or shortness of breath.  No fevers or chills.  She did feel nauseated but did not have vomiting.  She continues to have bowel movements which are hard but regular.  She states that this pain does not feel similar to her prior bowel obstruction. No radiation of symptoms or other modifying factors.   Past Medical History:  Diagnosis Date  . Chronic back pain   . Constipation   . Constipation   . COPD (chronic obstructive pulmonary disease) ([place])   . DVT (deep venous thrombosis) ([place])   . GERD (gastroesophageal reflux disease)   . Headache   . History of kidney stones   . Hyperlipidemia   . Hypertension     Patient Active Problem List   Diagnosis Date Noted  . Intractable abdominal pain 05/30/2019  . Partial bowel obstruction ([place]) 05/30/2019  . Preop cardiovascular exam 09/23/2018  . PAD (peripheral artery disease) ([facility]) -left iliac occlusion 09/23/2018  . Esophageal dysphagia   . SBO (small bowel obstruction) ([place]) 04/29/2018  . GERD (gastroesophageal reflux disease) 04/28/2018  . Hypokalemia 04/28/2018  . Pulmonary nodule 04/28/2018  . Hypernatremia 04/28/2018  . Enteritis 04/28/2018  . Encounter for screening colonoscopy 01/16/2018  . Abdominal pain 11/16/2017  . Constipation 11/16/2017  . Partial small bowel obstruction ([place]) 08/16/2016  . Hyperlipidemia 08/16/2016  . Chest pain 12/12/2014  . Hypertension 12/12/2014  . Tobacco abuse 10/29/2011    Past Surgical History:    Procedure Laterality Date  . ABDOMINAL AORTOGRAM W/LOWER EXTREMITY N/A 10/26/2017   Procedure: ABDOMINAL AORTOGRAM W/LOWER EXTREMITY;  Surgeon: Elam Dutch, MD;  Location: [facility];  Service: Cardiovascular: 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  Normal right aortoiliac vessels.  Three-vessel runoff bilaterally.  (Sluggish flow of the left side due to occlusion.  . ABDOMINAL HYSTERECTOMY    . CHOLECYSTECTOMY    . COLONOSCOPY  2007   Dr. Laural Golden: small internal hemorrhoids, otherwise normal. exam could be compromised due to quality of prep  . COLONOSCOPY N/A 02/15/2018   Dr. Oneida Alar: 2 simple adenomas, external and internal hemorrhoids. Next colonoscopy 2024-2026  . ESOPHAGOGASTRODUODENOSCOPY N/A 05/09/2018   Procedure: ESOPHAGOGASTRODUODENOSCOPY (EGD);  Surgeon: Danie Binder, MD;  [facility];  Service: Endoscopy;  Laterality: N/A;  2:15pm- Possible Pyloric Dilation  . FEMORAL-FEMORAL BYPASS GRAFT Bilateral 10/21/2018   Procedure: RIGHT TO LEFT FEMORAL ARTERY BYPASS GRAFT;  Surgeon: Elam Dutch, MD;  Location: [facility];  Service: Vascular;  Laterality: Bilateral;  . LOWER EXTREMITY ANGIOGRAPHY N/A 09/27/2018   Procedure: LOWER EXTREMITY ANGIOGRAPHY;  Surgeon: Elam Dutch, MD;  Location: [facility];  Service: Cardiovascular;  Laterality: N/A;  . NM MYOVIEW LTD  12/2014   [facility]: Normal Myoview.  Nonischemic.  Marland Kitchen TRANSTHORACIC ECHOCARDIOGRAM  12/2011   EF 55 to 60%.  No regional wall motion normality.  GRII DD.  No significant valve disease.    Allergies Patient  has no known allergies.  Family History  Problem Relation Age of Onset  . Hypertension Mother   . Peripheral Artery Disease Brother   . Colon cancer Neg Hx   . Colon polyps Neg Hx     Social History Social History   Tobacco Use  . Smoking status: Current Every Day Smoker    Packs/day: 0.50    Years:  40.00    Pack years: 20.00    Types: Cigarettes  . Smokeless tobacco: Never Used  Substance Use Topics  . Alcohol use: No    Alcohol/week: 0.0 standard drinks  . Drug use: No    Review of Systems  Constitutional: No fever/chills Eyes: No visual changes. ENT: No sore throat. Cardiovascular: Denies chest pain. Respiratory: Denies shortness of breath. Gastrointestinal: Epigastric abdominal pain. Positive nausea, no vomiting.  No diarrhea.  No constipation. Genitourinary: Negative for dysuria. Musculoskeletal: Negative for back pain. Skin: Negative for rash. Neurological: Negative for headaches, focal weakness or numbness.  10-point ROS otherwise negative.  ____________________________________________   PHYSICAL EXAM:  VITAL SIGNS: ED Triage Vitals  Enc Vitals Group     BP 02/28/20 2024 120/73     Pulse Rate 02/28/20 2025 76     Resp 02/28/20 2025 18     Temp 02/28/20 2025 98.8 F (37.1 C)     Temp Source 02/28/20 2025 Oral     SpO2 02/28/20 2025 98 %     Weight 02/28/20 2023 135 lb (61.2 kg)     Height 02/28/20 2023 5\' 2"  (1.575 m)   Constitutional: Alert and oriented. Well appearing and in no acute distress. Eyes: Conjunctivae are normal. Head: Atraumatic. Nose: No congestion/rhinnorhea. Mouth/Throat: Mucous membranes are moist. Neck: No stridor.   Cardiovascular: Normal rate, regular rhythm. Good peripheral circulation. Grossly normal heart sounds.   Respiratory: Normal respiratory effort.  No retractions. Lungs CTAB. Gastrointestinal: Soft and nontender. No distention.  Musculoskeletal: No gross deformities of extremities. Neurologic:  Normal speech and language.  Skin:  Skin is warm, dry and intact. No rash noted.   ____________________________________________   LABS (all labs ordered are listed, but only abnormal results are displayed)  Labs Reviewed  COMPREHENSIVE METABOLIC PANEL - Abnormal; Notable for the following components:      Result Value    Glucose, Bld 117 (*)    Calcium 8.8 (*)    All other components within normal limits  CBC - Abnormal; Notable for the following components:   WBC 11.7 (*)    RBC 5.27 (*)    HCT 46.3 (*)    All other components within normal limits  LIPASE, BLOOD   ____________________________________________  EKG   EKG Interpretation  Date/Time:  Saturday February 28 2020 20:46:43 EST Ventricular Rate:  78 PR Interval:    QRS Duration: 91 QT Interval:  393 QTC Calculation: 448 R Axis:   54 Text Interpretation: Sinus rhythm Probable anteroseptal infarct, old No STEMI Confirmed by Nanda Quinton [id number]) on 02/28/2020 8:54:09 PM       ____________________________________________  RADIOLOGY  DG Abdomen Acute W/Chest  Result Date: 02/28/2020 CLINICAL DATA:  Epigastric pain EXAM: DG ABDOMEN ACUTE W/ 1V CHEST COMPARISON:  May 31, 2019 FINDINGS: There is no evidence of dilated bowel loops or free intraperitoneal air. No radiopaque calculi or other significant radiographic abnormality is seen. Radiopaque surgical clips are seen overlying the right upper quadrant. Additional surgical clips are seen within the left hemipelvis. Heart size and mediastinal contours are within normal limits. Both lungs are  clear. IMPRESSION: Negative abdominal radiographs.  No acute cardiopulmonary disease. Electronically Signed   By: Virgina Norfolk M.D.   On: 02/28/2020 20:28    ____________________________________________   PROCEDURES  Procedure(s) performed:   Procedures  None  ____________________________________________   INITIAL IMPRESSION / ASSESSMENT AND PLAN / ED COURSE  Pertinent labs & imaging results that were available during my care of the patient were reviewed by me and considered in my medical decision making (see chart for details).   Patient presents to the emergency department for evaluation of epigastric abdominal pain radiating to the back which is burning in quality.  Abdomen is soft and  nontender.  Labs including CMP and lipase are pending.  EKG is unremarkable and symptoms seem very atypical for ACS/PE although these diagnoses were considered.  Doubt bowel obstruction.  Will obtain an acute abdomen series including chest and reassess after Maalox.   Labs and imaging reviewed. Plan for bentyl and continued Protonix at home and GI follow up plan. Patient to call on Monday to schedule the next available appointment and f/u with PCP as well. Discussed ED return precautions.  ____________________________________________  FINAL CLINICAL IMPRESSION(S) / ED DIAGNOSES  Final diagnoses:  Epigastric pain     MEDICATIONS GIVEN DURING THIS VISIT:  Medications  alum & mag hydroxide-simeth (MAALOX/MYLANTA) 200-200-20 MG/5ML suspension 15 mL (15 mLs Oral Given 02/28/20 2103)  dicyclomine (BENTYL) capsule 10 mg (10 mg Oral Given 02/28/20 2206)     NEW OUTPATIENT MEDICATIONS STARTED DURING THIS VISIT:  Discharge Medication List as of 02/28/2020  9:52 PM    START taking these medications   Details  dicyclomine (BENTYL) 20 MG tablet Take 1 tablet (20 mg total) by mouth 3 (three) times daily as needed for spasms (abdominal cramping)., Starting Sat 02/28/2020, Print        Note:  This document was prepared using Dragon voice recognition software and may include unintentional dictation errors.  Nanda Quinton, MD, [facility] Emergency Medicine    Anaelle Dunton, Wonda Olds, MD 02/28/20 [phone]

## 2020-03-10 DIAGNOSIS — M1990 Unspecified osteoarthritis, unspecified site: Secondary | ICD-10-CM | POA: Diagnosis not present

## 2020-03-10 DIAGNOSIS — K219 Gastro-esophageal reflux disease without esophagitis: Secondary | ICD-10-CM | POA: Diagnosis not present

## 2020-03-27 ENCOUNTER — Ambulatory Visit: Payer: Medicare HMO | Attending: Internal Medicine

## 2020-03-27 DIAGNOSIS — Z23 Encounter for immunization: Secondary | ICD-10-CM

## 2020-03-27 NOTE — Progress Notes (Signed)
   Covid-19 Vaccination Clinic  Name:  Virginia Washington    MRN: [id number] DOB: 1956-02-24  03/27/2020  [name] was observed post Covid-19 immunization for 15 minutes without incident. She was provided with Vaccine Information Sheet and instruction to access the V-Safe system.   [name] was instructed to call 911 with any severe reactions post vaccine: Marland Kitchen Difficulty breathing  . Swelling of face and throat  . A fast heartbeat  . A bad rash all over body  . Dizziness and weakness   Immunizations Administered    Name Date Dose VIS Date Route   Moderna COVID-19 Vaccine 03/27/2020 10:00 AM 0.5 mL 11/18/2019 Intramuscular   Manufacturer: Moderna   Lot: [id number]   [place][id number]

## 2020-03-31 DIAGNOSIS — G5603 Carpal tunnel syndrome, bilateral upper limbs: Secondary | ICD-10-CM | POA: Diagnosis not present

## 2020-03-31 DIAGNOSIS — G5602 Carpal tunnel syndrome, left upper limb: Secondary | ICD-10-CM | POA: Diagnosis not present

## 2020-04-07 DIAGNOSIS — I10 Essential (primary) hypertension: Secondary | ICD-10-CM | POA: Diagnosis not present

## 2020-04-07 DIAGNOSIS — K219 Gastro-esophageal reflux disease without esophagitis: Secondary | ICD-10-CM | POA: Diagnosis not present

## 2020-04-07 DIAGNOSIS — R109 Unspecified abdominal pain: Secondary | ICD-10-CM | POA: Diagnosis not present

## 2020-04-07 DIAGNOSIS — F17210 Nicotine dependence, cigarettes, uncomplicated: Secondary | ICD-10-CM | POA: Diagnosis not present

## 2020-04-07 DIAGNOSIS — R69 Illness, unspecified: Secondary | ICD-10-CM | POA: Diagnosis not present

## 2020-04-24 ENCOUNTER — Ambulatory Visit: Payer: Medicare HMO | Attending: Internal Medicine

## 2020-04-24 DIAGNOSIS — Z23 Encounter for immunization: Secondary | ICD-10-CM

## 2020-04-24 NOTE — Progress Notes (Signed)
   Covid-19 Vaccination Clinic  Name:  Virginia Washington    MRN: [id number] DOB: 07-Apr-1956  04/24/2020  Virginia Washington was observed post Covid-19 immunization for 15 minutes without incident. She was provided with Vaccine Information Sheet and instruction to access the V-Safe system.   Virginia Washington was instructed to call 911 with any severe reactions post vaccine: Marland Kitchen Difficulty breathing  . Swelling of face and throat  . A fast heartbeat  . A bad rash all over body  . Dizziness and weakness   Immunizations Administered    Name Date Dose VIS Date Route   Moderna COVID-19 Vaccine 04/24/2020  9:15 AM 0.5 mL 11/2019 Intramuscular   Manufacturer: Moderna   Lot: [id number]   [id number]

## 2020-05-05 ENCOUNTER — Encounter: Payer: Self-pay | Admitting: Gastroenterology

## 2020-05-07 DIAGNOSIS — J449 Chronic obstructive pulmonary disease, unspecified: Secondary | ICD-10-CM | POA: Diagnosis not present

## 2020-05-07 DIAGNOSIS — K219 Gastro-esophageal reflux disease without esophagitis: Secondary | ICD-10-CM | POA: Diagnosis not present

## 2020-06-03 ENCOUNTER — Other Ambulatory Visit: Payer: Self-pay

## 2020-06-03 ENCOUNTER — Emergency Department (HOSPITAL_COMMUNITY): Payer: Medicare HMO

## 2020-06-03 ENCOUNTER — Encounter (HOSPITAL_COMMUNITY): Payer: Self-pay

## 2020-06-03 ENCOUNTER — Emergency Department (HOSPITAL_COMMUNITY)
Admission: EM | Admit: 2020-06-03 | Discharge: 2020-06-04 | Disposition: A | Discharge status: Home / Self Care | Payer: Medicare HMO | Attending: Emergency Medicine | Admitting: Emergency Medicine

## 2020-06-03 DIAGNOSIS — S060X0A Concussion without loss of consciousness, initial encounter: Secondary | ICD-10-CM | POA: Diagnosis not present

## 2020-06-03 DIAGNOSIS — S299XXA Unspecified injury of thorax, initial encounter: Secondary | ICD-10-CM | POA: Diagnosis not present

## 2020-06-03 DIAGNOSIS — S298XXA Other specified injuries of thorax, initial encounter: Secondary | ICD-10-CM

## 2020-06-03 DIAGNOSIS — R69 Illness, unspecified: Secondary | ICD-10-CM | POA: Diagnosis not present

## 2020-06-03 DIAGNOSIS — I10 Essential (primary) hypertension: Secondary | ICD-10-CM | POA: Insufficient documentation

## 2020-06-03 DIAGNOSIS — J449 Chronic obstructive pulmonary disease, unspecified: Secondary | ICD-10-CM | POA: Insufficient documentation

## 2020-06-03 DIAGNOSIS — F17210 Nicotine dependence, cigarettes, uncomplicated: Secondary | ICD-10-CM | POA: Diagnosis not present

## 2020-06-03 DIAGNOSIS — R109 Unspecified abdominal pain: Secondary | ICD-10-CM | POA: Insufficient documentation

## 2020-06-03 DIAGNOSIS — R0689 Other abnormalities of breathing: Secondary | ICD-10-CM | POA: Diagnosis not present

## 2020-06-03 DIAGNOSIS — S161XXA Strain of muscle, fascia and tendon at neck level, initial encounter: Secondary | ICD-10-CM | POA: Diagnosis not present

## 2020-06-03 DIAGNOSIS — S3991XA Unspecified injury of abdomen, initial encounter: Secondary | ICD-10-CM | POA: Diagnosis not present

## 2020-06-03 DIAGNOSIS — R079 Chest pain, unspecified: Secondary | ICD-10-CM | POA: Diagnosis not present

## 2020-06-03 DIAGNOSIS — G44309 Post-traumatic headache, unspecified, not intractable: Secondary | ICD-10-CM | POA: Diagnosis not present

## 2020-06-03 DIAGNOSIS — R519 Headache, unspecified: Secondary | ICD-10-CM | POA: Diagnosis not present

## 2020-06-03 DIAGNOSIS — M542 Cervicalgia: Secondary | ICD-10-CM | POA: Insufficient documentation

## 2020-06-03 DIAGNOSIS — R0789 Other chest pain: Secondary | ICD-10-CM | POA: Diagnosis not present

## 2020-06-03 LAB — CBC WITH DIFFERENTIAL/PLATELET
Abs Immature Granulocytes: 0.05 10*3/uL (ref 0.00–0.07)
Basophils Absolute: 0.1 10*3/uL (ref 0.0–0.1)
Basophils Relative: 1 %
Eosinophils Absolute: 0.1 10*3/uL (ref 0.0–0.5)
Eosinophils Relative: 1 %
HCT: 44.5 % (ref 36.0–46.0)
Hemoglobin: 13.4 g/dL (ref 12.0–15.0)
Immature Granulocytes: 1 %
Lymphocytes Relative: 33 %
Lymphs Abs: 2.8 10*3/uL (ref 0.7–4.0)
MCH: 27.1 pg (ref 26.0–34.0)
MCHC: 30.1 g/dL (ref 30.0–36.0)
MCV: 90.1 fL (ref 80.0–100.0)
Monocytes Absolute: 0.7 10*3/uL (ref 0.1–1.0)
Monocytes Relative: 8 %
Neutro Abs: 4.8 10*3/uL (ref 1.7–7.7)
Neutrophils Relative %: 56 %
Platelets: 216 10*3/uL (ref 150–400)
RBC: 4.94 MIL/uL (ref 3.87–5.11)
RDW: 14.9 % (ref 11.5–15.5)
WBC: 8.5 10*3/uL (ref 4.0–10.5)
nRBC: 0 % (ref 0.0–0.2)

## 2020-06-03 LAB — BASIC METABOLIC PANEL
Anion gap: 10 (ref 5–15)
BUN: 11 mg/dL (ref 8–23)
CO2: 21 mmol/L — ABNORMAL LOW (ref 22–32)
Calcium: 8.1 mg/dL — ABNORMAL LOW (ref 8.9–10.3)
Chloride: 106 mmol/L (ref 98–111)
Creatinine, Ser: 0.81 mg/dL (ref 0.44–1.00)
GFR calc Af Amer: >60 mL/min (ref >60)
GFR calc non Af Amer: >60 mL/min (ref >60)
Glucose, Bld: 103 mg/dL — ABNORMAL HIGH (ref 70–99)
Potassium: 3.5 mmol/L (ref 3.5–5.1)
Sodium: 137 mmol/L (ref 135–145)

## 2020-06-03 MED ORDER — ONDANSETRON HCL 4 MG/2ML IJ SOLN
4.0000 mg | Freq: Once | INTRAMUSCULAR | Status: AC
  Administered 2020-06-03: 4 mg via INTRAVENOUS
  Filled 2020-06-03: qty 2

## 2020-06-03 MED ORDER — SODIUM CHLORIDE 0.9 % IV BOLUS
500.0000 mL | Freq: Once | INTRAVENOUS | Status: AC
  Administered 2020-06-03: 500 mL via INTRAVENOUS

## 2020-06-03 MED ORDER — MORPHINE SULFATE (PF) 4 MG/ML IV SOLN
4.0000 mg | Freq: Once | INTRAVENOUS | Status: AC
  Administered 2020-06-03: 4 mg via INTRAVENOUS
  Filled 2020-06-03: qty 1

## 2020-06-03 MED ORDER — IOHEXOL 300 MG/ML  SOLN
100.0000 mL | Freq: Once | INTRAMUSCULAR | Status: AC | PRN
  Administered 2020-06-03: 100 mL via INTRAVENOUS
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
## 2020-06-04 NOTE — ED Notes (Signed)
Patient able to ambulate in room.

## 2020-06-04 NOTE — ED Provider Notes (Signed)
Imaging is negative Pt improved She is ambulatory Will d/c home Advised OTC  meds   Ripley Fraise, MD 06/04/20 [phone]

## 2020-06-07 DIAGNOSIS — E785 Hyperlipidemia, unspecified: Secondary | ICD-10-CM | POA: Diagnosis not present

## 2020-06-07 DIAGNOSIS — K219 Gastro-esophageal reflux disease without esophagitis: Secondary | ICD-10-CM | POA: Diagnosis not present

## 2020-07-08 DIAGNOSIS — R69 Illness, unspecified: Secondary | ICD-10-CM | POA: Diagnosis not present

## 2020-07-08 DIAGNOSIS — J449 Chronic obstructive pulmonary disease, unspecified: Secondary | ICD-10-CM | POA: Diagnosis not present

## 2020-07-08 DIAGNOSIS — F17210 Nicotine dependence, cigarettes, uncomplicated: Secondary | ICD-10-CM | POA: Diagnosis not present

## 2020-07-08 DIAGNOSIS — I10 Essential (primary) hypertension: Secondary | ICD-10-CM | POA: Diagnosis not present

## 2020-07-08 DIAGNOSIS — N3941 Urge incontinence: Secondary | ICD-10-CM | POA: Diagnosis not present

## 2020-07-08 DIAGNOSIS — E785 Hyperlipidemia, unspecified: Secondary | ICD-10-CM | POA: Diagnosis not present

## 2020-07-16 ENCOUNTER — Encounter (HOSPITAL_COMMUNITY): Payer: Self-pay | Admitting: *Deleted

## 2020-07-16 ENCOUNTER — Ambulatory Visit: Payer: Self-pay | Admitting: *Deleted

## 2020-07-16 ENCOUNTER — Emergency Department (HOSPITAL_COMMUNITY)
Admission: EM | Admit: 2020-07-16 | Discharge: 2020-07-17 | Disposition: A | Discharge status: Home / Self Care | Payer: Medicare HMO | Attending: Emergency Medicine | Admitting: Emergency Medicine

## 2020-07-16 ENCOUNTER — Other Ambulatory Visit: Payer: Self-pay

## 2020-07-16 ENCOUNTER — Emergency Department (HOSPITAL_COMMUNITY): Payer: Medicare HMO

## 2020-07-16 DIAGNOSIS — J449 Chronic obstructive pulmonary disease, unspecified: Secondary | ICD-10-CM | POA: Diagnosis not present

## 2020-07-16 DIAGNOSIS — R1084 Generalized abdominal pain: Secondary | ICD-10-CM

## 2020-07-16 DIAGNOSIS — I10 Essential (primary) hypertension: Secondary | ICD-10-CM | POA: Diagnosis not present

## 2020-07-16 DIAGNOSIS — E876 Hypokalemia: Secondary | ICD-10-CM

## 2020-07-16 DIAGNOSIS — Z79899 Other long term (current) drug therapy: Secondary | ICD-10-CM | POA: Diagnosis not present

## 2020-07-16 DIAGNOSIS — K219 Gastro-esophageal reflux disease without esophagitis: Secondary | ICD-10-CM | POA: Diagnosis not present

## 2020-07-16 DIAGNOSIS — K5900 Constipation, unspecified: Secondary | ICD-10-CM

## 2020-07-16 DIAGNOSIS — R69 Illness, unspecified: Secondary | ICD-10-CM | POA: Diagnosis not present

## 2020-07-16 DIAGNOSIS — R109 Unspecified abdominal pain: Secondary | ICD-10-CM | POA: Diagnosis not present

## 2020-07-16 DIAGNOSIS — F17210 Nicotine dependence, cigarettes, uncomplicated: Secondary | ICD-10-CM | POA: Diagnosis not present

## 2020-07-16 DIAGNOSIS — R140 Abdominal distension (gaseous): Secondary | ICD-10-CM | POA: Diagnosis not present

## 2020-07-16 DIAGNOSIS — R112 Nausea with vomiting, unspecified: Secondary | ICD-10-CM | POA: Insufficient documentation

## 2020-07-16 DIAGNOSIS — E860 Dehydration: Secondary | ICD-10-CM | POA: Diagnosis not present

## 2020-07-16 DIAGNOSIS — R1011 Right upper quadrant pain: Secondary | ICD-10-CM | POA: Diagnosis present

## 2020-07-16 DIAGNOSIS — Z20822 Contact with and (suspected) exposure to covid-19: Secondary | ICD-10-CM | POA: Diagnosis not present

## 2020-07-16 LAB — CBC WITH DIFFERENTIAL/PLATELET
Abs Immature Granulocytes: 0.04 10*3/uL (ref 0.00–0.07)
Basophils Absolute: 0 10*3/uL (ref 0.0–0.1)
Basophils Relative: 0 %
Eosinophils Absolute: 0 10*3/uL (ref 0.0–0.5)
Eosinophils Relative: 0 %
HCT: 44.5 % (ref 36.0–46.0)
Hemoglobin: 14 g/dL (ref 12.0–15.0)
Immature Granulocytes: 0 %
Lymphocytes Relative: 22 %
Lymphs Abs: 2.9 10*3/uL (ref 0.7–4.0)
MCH: 27.8 pg (ref 26.0–34.0)
MCHC: 31.5 g/dL (ref 30.0–36.0)
MCV: 88.5 fL (ref 80.0–100.0)
Monocytes Absolute: 0.7 10*3/uL (ref 0.1–1.0)
Monocytes Relative: 5 %
Neutro Abs: 9.5 10*3/uL — ABNORMAL HIGH (ref 1.7–7.7)
Neutrophils Relative %: 73 %
Platelets: 228 10*3/uL (ref 150–400)
RBC: 5.03 MIL/uL (ref 3.87–5.11)
RDW: 14.1 % (ref 11.5–15.5)
WBC: 13.1 10*3/uL — ABNORMAL HIGH (ref 4.0–10.5)
nRBC: 0 % (ref 0.0–0.2)

## 2020-07-16 LAB — URINALYSIS, ROUTINE W REFLEX MICROSCOPIC
Bilirubin Urine: NEGATIVE
Glucose, UA: NEGATIVE mg/dL
Hgb urine dipstick: NEGATIVE
Ketones, ur: NEGATIVE mg/dL
Leukocytes,Ua: NEGATIVE
Nitrite: NEGATIVE
Protein, ur: NEGATIVE mg/dL
Specific Gravity, Urine: >1.046 — ABNORMAL HIGH (ref 1.005–1.030)
pH: 6 (ref 5.0–8.0)

## 2020-07-16 LAB — COMPREHENSIVE METABOLIC PANEL
ALT: 14 U/L (ref 0–44)
AST: 13 U/L — ABNORMAL LOW (ref 15–41)
Albumin: 3.9 g/dL (ref 3.5–5.0)
Alkaline Phosphatase: 105 U/L (ref 38–126)
Anion gap: 10 (ref 5–15)
BUN: 12 mg/dL (ref 8–23)
CO2: 24 mmol/L (ref 22–32)
Calcium: 8.7 mg/dL — ABNORMAL LOW (ref 8.9–10.3)
Chloride: 107 mmol/L (ref 98–111)
Creatinine, Ser: 0.89 mg/dL (ref 0.44–1.00)
GFR calc Af Amer: >60 mL/min (ref >60)
GFR calc non Af Amer: >60 mL/min (ref >60)
Glucose, Bld: 80 mg/dL (ref 70–99)
Potassium: 3 mmol/L — ABNORMAL LOW (ref 3.5–5.1)
Sodium: 141 mmol/L (ref 135–145)
Total Bilirubin: 0.7 mg/dL (ref 0.3–1.2)
Total Protein: 7 g/dL (ref 6.5–8.1)

## 2020-07-16 LAB — LIPASE, BLOOD: Lipase: 29 U/L (ref 11–51)

## 2020-07-16 MED ORDER — IOHEXOL 300 MG/ML  SOLN
100.0000 mL | Freq: Once | INTRAMUSCULAR | Status: AC | PRN
  Administered 2020-07-16: 100 mL via INTRAVENOUS

## 2020-07-16 MED ORDER — ONDANSETRON HCL 4 MG/2ML IJ SOLN
4.0000 mg | Freq: Once | INTRAMUSCULAR | Status: AC
  Administered 2020-07-16: 4 mg via INTRAVENOUS
  Filled 2020-07-16: qty 2

## 2020-07-16 MED ORDER — FENTANYL CITRATE (PF) 100 MCG/2ML IJ SOLN
50.0000 ug | Freq: Once | INTRAMUSCULAR | Status: AC
  Administered 2020-07-16: 50 ug via INTRAVENOUS
  Filled 2020-07-16: qty 2

## 2020-07-16 NOTE — Telephone Encounter (Signed)
Error in charting, already completed this triage.

## 2020-07-16 NOTE — Telephone Encounter (Signed)
  Called for abd pain.  Reason for Disposition . [1] SEVERE pain (e.g., excruciating) AND [2] present > 1 hour  Answer Assessment - Initial Assessment Questions 1. LOCATION: "Where does it hurt?"      Toward back of abdomin and bottom 2. RADIATION: "Does the pain shoot anywhere else?" (e.g., chest, back)     Knot in top of abdomen and shoots to back of abdomen 3. ONSET: "When did the pain begin?" (e.g., minutes, hours or days ago)      yesterday 4. SUDDEN: "Gradual or sudden onset?"     Gradually, no bm 3 days 5. PATTERN "Does the pain come and go, or is it constant?"    - If constant: "Is it getting better, staying the same, or worsening?"      (Note: Constant means the pain never goes away completely; most serious pain is constant and it progresses)     - If intermittent: "How long does it last?" "Do you have pain now?"     (Note: Intermittent means the pain goes away completely between bouts)     yes 6. SEVERITY: "How bad is the pain?"  (e.g., Scale 1-10; mild, moderate, or severe)    - MILD (1-3): doesn't interfere with normal activities, abdomen soft and not tender to touch     - MODERATE (4-7): interferes with normal activities or awakens from sleep, tender to touch     - SEVERE (8-10): excruciating pain, doubled over, unable to do any normal activities       8 7. RECURRENT SYMPTOM: "Have you ever had this type of stomach pain before?" If Yes, ask: "When was the last time?" and "What happened that time?"      Yes, had a bowel obstruction 8. AGGRAVATING FACTORS: "Does anything seem to cause this pain?" (e.g., foods, stress, alcohol)     Drank soda and vomited 9. CARDIAC SYMPTOMS: "Do you have any of the following symptoms: chest pain, difficulty breathing, sweating, nausea?"     no 10. OTHER SYMPTOMS: "Do you have any other symptoms?" (e.g., fever, vomiting, diarrhea)       Vomiting after tried to drink 11. PREGNANCY: "Is there any chance you are pregnant?" "When was your last  menstrual period?"       na  Protocols used: ABDOMINAL PAIN - UPPER-A-AH  Pt states first thing that she has history of bowel obstruction and this feels just like last time. Advised to go to ED and have someone drive her. She agrees.

## 2020-07-16 NOTE — ED Notes (Signed)
ED Provider at bedside. 

## 2020-07-16 NOTE — ED Provider Notes (Signed)
[facility] EMERGENCY DEPARTMENT Provider Note   CSN: [id number] Arrival date & time: 07/16/20  1947     History Chief Complaint  Patient presents with   Abdominal Pain    Virginia Washington is a 64 y.o. female who presents with abdominal pain, N/V x 2 days. Located in the RUQ and radiates around the entire diaphragmatic region. + distention. She pain is sharp, colicky,. She has a hx of constipation. Last BM 2-3 days ago and hard. She went to UC yesterday and was given Linzess without improvement in her sxs.  Hx of SBO and previous Cholecystectomy.  HPI     Past Medical History:  Diagnosis Date   Chronic back pain    Constipation    Constipation    COPD (chronic obstructive pulmonary disease) (HCC)    DVT (deep venous thrombosis) (HCC)    GERD (gastroesophageal reflux disease)    Headache    History of kidney stones    Hyperlipidemia    Hypertension     Patient Active Problem List   Diagnosis Date Noted   Intractable abdominal pain 05/30/2019   Partial bowel obstruction ([place]) 05/30/2019   Preop cardiovascular exam 09/23/2018   PAD (peripheral artery disease) ([place]) -left iliac occlusion 09/23/2018   Esophageal dysphagia    SBO (small bowel obstruction) ([place]) 04/29/2018   GERD (gastroesophageal reflux disease) 04/28/2018   Hypokalemia 04/28/2018   Pulmonary nodule 04/28/2018   Hypernatremia 04/28/2018   Enteritis 04/28/2018   Encounter for screening colonoscopy 01/16/2018   Abdominal pain 11/16/2017   Constipation 11/16/2017   Partial small bowel obstruction ([place]) 08/16/2016   Hyperlipidemia 08/16/2016   Chest pain 12/12/2014   Hypertension 12/12/2014   Tobacco abuse 10/29/2011    Past Surgical History:  Procedure Laterality Date   ABDOMINAL AORTOGRAM W/LOWER EXTREMITY N/A 10/26/2017   Procedure: ABDOMINAL AORTOGRAM W/LOWER EXTREMITY;  Surgeon: Elam Dutch, MD;  Location: [place] [facility];  Service: Cardiovascular: Chronic  occlusion of left common iliac artery reconstituting and distal common iliac before bifurcation.  Normal right aortoiliac vessels.  Three-vessel runoff bilaterally.  (Sluggish flow of the left side due to occlusion.   ABDOMINAL HYSTERECTOMY     CHOLECYSTECTOMY     COLONOSCOPY  2007   Dr. Laural Golden: small internal hemorrhoids, otherwise normal. exam could be compromised due to quality of prep   COLONOSCOPY N/A 02/15/2018   Dr. Oneida Alar: 2 simple adenomas, external and internal hemorrhoids. Next colonoscopy 2024-2026   ESOPHAGOGASTRODUODENOSCOPY N/A 05/09/2018   Procedure: ESOPHAGOGASTRODUODENOSCOPY (EGD);  Surgeon: Danie Binder, MD;  [facility];  Service: Endoscopy;  Laterality: N/A;  2:15pm- Possible Pyloric Dilation   FEMORAL-FEMORAL BYPASS GRAFT Bilateral 10/21/2018   Procedure: RIGHT TO LEFT FEMORAL ARTERY BYPASS GRAFT;  Surgeon: Elam Dutch, MD;  Location: [place];  Service: Vascular;  Laterality: Bilateral;   LOWER EXTREMITY ANGIOGRAPHY N/A 09/27/2018   Procedure: LOWER EXTREMITY ANGIOGRAPHY;  Surgeon: Elam Dutch, MD;  Location: [place] [facility];  Service: Cardiovascular;  Laterality: N/A;   NM MYOVIEW LTD  12/2014   [facility]: Normal Myoview.  Nonischemic.   TRANSTHORACIC ECHOCARDIOGRAM  12/2011   EF 55 to 60%.  No regional wall motion normality.  GRII DD.  No significant valve disease.     OB History    Gravida      Para      Term      Preterm      AB      Living  3     SAB      TAB      Ectopic      Multiple      Live Births              Family History  Problem Relation Age of Onset   Hypertension Mother    Peripheral Artery Disease Brother    Colon cancer Neg Hx    Colon polyps Neg Hx     Social History   Tobacco Use   Smoking status: Current Every Day Smoker    Packs/day: 0.50    Years: 40.00    Pack years: 20.00    Types: Cigarettes   Smokeless tobacco: Never Used  Vaping Use   Vaping Use: Never  used  Substance Use Topics   Alcohol use: No    Alcohol/week: 0.0 standard drinks   Drug use: No    Home Medications Prior to Admission medications   Medication Sig Start Date End Date Taking? Authorizing Provider  amLODipine (NORVASC) 5 MG tablet Take 5 mg by mouth daily.    [provider]  atorvastatin (LIPITOR) 20 MG tablet Take 1 tablet (20 mg total) by mouth daily at 6 PM. 06/02/19   Barton Dubois, MD  diclofenac sodium (VOLTAREN) 1 % GEL Apply 2 g topically 3 (three) times daily. 07/08/19   Triplett, Tammy, PA-C  dicyclomine (BENTYL) 20 MG tablet Take 1 tablet (20 mg total) by mouth 3 (three) times daily as needed for spasms (abdominal cramping). 02/28/20   Long, Wonda Olds, MD  LINZESS 290 MCG CAPS capsule TAKE 1 CAPSULE(290 MCG) BY MOUTH DAILY BEFORE BREAKFAST 09/15/19   Mahala Menghini, PA-C  meloxicam (MOBIC) 7.5 MG tablet Take 7.5 mg by mouth daily. 12/11/19   [provider]  naproxen (NAPROSYN) 500 MG tablet Take 1 tablet (500 mg total) by mouth 2 (two) times daily. 10/04/19   Petrucelli, Samantha R, PA-C  pantoprazole (PROTONIX) 40 MG tablet Take 1 tablet (40 mg total) by mouth daily. 06/03/19   Barton Dubois, MD  traMADol (ULTRAM) 50 MG tablet Take 1 tablet (50 mg total) by mouth every 6 (six) hours as needed. 07/08/19   Triplett, Tammy, PA-C    Allergies    Patient has no known allergies.  Review of Systems   Review of Systems Ten systems reviewed and are negative for acute change, except as noted in the HPI.   Physical Exam Updated Vital Signs BP 123/77    Pulse 80    Temp 99.6 F (37.6 C) (Oral)    Resp 18    Ht 5\' 2"  (1.575 m)    Wt 61.2 kg    SpO2 100%    BMI 24.69 kg/m   Physical Exam Vitals and nursing note reviewed.  Constitutional:      General: She is not in acute distress.    Appearance: She is well-developed. She is not diaphoretic.  HENT:     Head: Normocephalic and atraumatic.  Eyes:     General: No scleral icterus.     Conjunctiva/sclera: Conjunctivae normal.  Cardiovascular:     Rate and Rhythm: Normal rate and regular rhythm.     Heart sounds: Normal heart sounds. No murmur heard.  No friction rub. No gallop.   Pulmonary:     Effort: Pulmonary effort is normal. No respiratory distress.     Breath sounds: Normal breath sounds.  Abdominal:     General: A surgical scar is present. Bowel sounds are  normal. There is distension.     Palpations: Abdomen is soft. There is no mass.     Tenderness: There is abdominal tenderness. There is no guarding.       Comments: Well healed hysterctomy/open Cholecystectomy scar  Musculoskeletal:     Cervical back: Normal range of motion.  Skin:    General: Skin is warm and dry.  Neurological:     Mental Status: She is alert and oriented to person, place, and time.  Psychiatric:        Behavior: Behavior normal.     ED Results / Procedures / Treatments   Labs (all labs ordered are listed, but only abnormal results are displayed) Labs Reviewed - No data to display  EKG None  Radiology No results found.  Procedures Procedures (including critical care time)  Medications Ordered in ED Medications - No data to display  ED Course  I have reviewed the triage vital signs and the nursing notes.  Pertinent labs & imaging results that were available during my care of the patient were reviewed by me and considered in my medical decision making (see chart for details).    MDM Rules/Calculators/A&P                            OE:[id number] pain, n/v  VS:BP 120/68    Pulse 83    Temp 99.2 F (37.3 C)    Resp 18    Ht 5\' 2"  (1.575 m)    Wt 61.2 kg    SpO2 95%    BMI 24.69 kg/m   OI:BBCWUGQ is gathered by paient and EMR. Previous records obtained and reviewed. DDX:The patient's complaint of n/v involves an extensive number of diagnostic and treatment options, and is a complaint that carries with it a high risk of complications, morbidity, and potential  mortality. Given the large differential diagnosis, medical decision making is of high complexity. The emergent differential diagnosis for vomiting includes, but is not limited to ACS/MI, Boerhaave's, DKA, Intracranial Hemorrhage, Ischemic bowel, Meningitis, Sepsis, Acute radiation syndrome, Acute gastric dilation, Acetaminophen toxicity, Adrenal insufficiency, Appendicitis, Aspirin toxicity, Bowel obstruction/ileus, Carbon monoxide poisoning, Cholecystitis, CNS tumor. Digoxin toxicity, Electrolyte abnormalities, Elevated ICP, Gastric outlet obstruction, Hyperemesis gravidarum, Pancreatitis, Peritonitis, Ruptured viscus, Testicular torsion/ovarian torsion, Theophyline toxicity, Biliary colic, Cannabinoid hyperemesis syndrome, Chemotherapy, Disulfiram effect, Erythromycin, ETOH, Gastritis, Gastroenteritis, Gastroparesis, Hepatitis, Ibuprofen, Ipecac toxicity, Labyrinthitis, Migraine, Motion sickness, Narcotic withdrawal, Thyroid, Pregnancy, Peptic ulcer disease, Renal colic, and UTI Labs: I ordered reviewed and interpreted labs which include  CBC with wbc count of 13,000, Hypokalemic to 3.0 UA negative for infection , LIpase wnl Imaging:ct/ abd/ pelvis pending  Sign out given to Dr. Rolland Porter and shift change          Final Clinical Impression(s) / ED Diagnoses Final diagnoses:  None    Rx / DC Orders ED Discharge Orders    None       Margarita Mail, PA-C 07/18/20 1045    Dorie Rank, MD 07/19/20 1053

## 2020-07-16 NOTE — ED Triage Notes (Signed)
Pt c/o abd pain with n/v that started yesterday, last bowel movement was two days ago, has hx of SBO

## 2020-07-17 DIAGNOSIS — R1084 Generalized abdominal pain: Secondary | ICD-10-CM | POA: Diagnosis not present

## 2020-07-17 LAB — SARS CORONAVIRUS 2 BY RT PCR (HOSPITAL ORDER, PERFORMED IN ~~LOC~~ HOSPITAL LAB): SARS Coronavirus 2: NEGATIVE

## 2020-07-17 MED ORDER — POTASSIUM CHLORIDE CRYS ER 20 MEQ PO TBCR
40.0000 meq | EXTENDED_RELEASE_TABLET | Freq: Once | ORAL | Status: AC
  Administered 2020-07-17: 40 meq via ORAL
  Filled 2020-07-17: qty 2

## 2020-07-17 MED ORDER — SODIUM CHLORIDE 0.9 % IV BOLUS
1000.0000 mL | Freq: Once | INTRAVENOUS | Status: AC
  Administered 2020-07-17: 1000 mL via INTRAVENOUS

## 2020-07-17 MED ORDER — SODIUM CHLORIDE 0.9 % IV BOLUS
500.0000 mL | Freq: Once | INTRAVENOUS | Status: AC
  Administered 2020-07-17: 500 mL via INTRAVENOUS

## 2020-07-17 MED ORDER — ONDANSETRON HCL 4 MG PO TABS
4.0000 mg | ORAL_TABLET | Freq: Three times a day (TID) | ORAL | 0 refills | Status: DC | PRN
Start: 2020-07-17 — End: 2020-08-11

## 2020-07-17 MED ORDER — POTASSIUM CHLORIDE CRYS ER 20 MEQ PO TBCR
20.0000 meq | EXTENDED_RELEASE_TABLET | Freq: Two times a day (BID) | ORAL | 0 refills | Status: DC
Start: 2020-07-17 — End: 2020-10-05

## 2020-07-17 NOTE — Discharge Instructions (Addendum)
Get miralax and put one dose or 17 g in 8 ounces of water,  take 1 dose every 30 minutes for 2-3 hours or until you  get good results and then once or twice daily to prevent constipation.  You can use Dulcolax suppositories to soften the hard stools in your rectum.  Try to drink plenty of fluids so you do not get dehydrated again.  Take the potassium pills until gone.  Consider seeing Dr. Jenetta Downer, a gastroenterologist if you continue to have problems.

## 2020-07-17 NOTE — ED Provider Notes (Signed)
Patient left at change of shift to get results of her CT of the abdomen pelvis.  Patient presented with abdominal pain and has had prior abdominal surgeries.  Concern was for ileus because of her low potassium or possible bowel obstruction from adhesions from her prior surgeries.  When I review her test results I noted her urine has a specific gravity greater than 1046 consistent with severe dehydration.  IV fluids were ordered for the patient.  Patient denies having cough, shortness of breath or diarrhea.  She states she has had nausea and vomited x3.  She states currently her nausea is gone.  She has not had a bowel movement in 2 days.  She has a history of constipation and states she has been on Linzess for the past 2 years.  She states a lot of the times her bowel movements are very hard.  When I review her CT she does have a lot of stool throughout her colon.  Patient was given 1500 cc of normal saline bolus.  When I rechecked her at 3:00 AM she states she has had urinary output twice.  She states she feels improved.  She feels ready to be discharged home.   Medications  ondansetron (ZOFRAN) injection 4 mg (4 mg Intravenous Given 07/16/20 2207)  fentaNYL (SUBLIMAZE) injection 50 mcg (50 mcg Intravenous Given 07/16/20 2208)  iohexol (OMNIPAQUE) 300 MG/ML solution 100 mL (100 mLs Intravenous Contrast Given 07/16/20 2306)  sodium chloride 0.9 % bolus 1,000 mL (1,000 mLs Intravenous New Bag/Given 07/17/20 0122)  sodium chloride 0.9 % bolus 500 mL (500 mLs Intravenous New Bag/Given 07/17/20 0122)  potassium chloride SA (KLOR-CON) CR tablet 40 mEq (40 mEq Oral Given 07/17/20 0122)     CT ABDOMEN PELVIS W CONTRAST  Result Date: 07/16/2020 CLINICAL DATA:  Abdominal distension and pain EXAM: CT ABDOMEN AND PELVIS WITH CONTRAST TECHNIQUE: Multidetector CT imaging of the abdomen and pelvis was performed using the standard protocol following bolus administration of intravenous contrast. CONTRAST:  157mL  OMNIPAQUE IOHEXOL 300 MG/ML  SOLN COMPARISON:  June 03, 2020 FINDINGS: Lower chest: The visualized heart size within normal limits. No pericardial fluid/thickening. No hiatal hernia. Hazy patchy airspace opacity seen at both lung bases. Hepatobiliary: The liver is normal in density without focal abnormality.The main portal vein is patent. The patient is status post cholecystectomy. No biliary ductal dilation. Pancreas: Unremarkable. No pancreatic ductal dilatation or surrounding inflammatory changes. Spleen: Normal in size without focal abnormality. Adrenals/Urinary Tract: Both adrenal glands appear normal. The kidneys and collecting system appear normal without evidence of urinary tract calculus or hydronephrosis. Bladder is unremarkable. Stomach/Bowel: The stomach, small bowel, and colon are normal in appearance. No inflammatory changes, wall thickening, or obstructive findings.The appendix is normal. Vascular/Lymphatic: There are no enlarged mesenteric, retroperitoneal, or pelvic lymph nodes. Again noted is occlusion of the left common iliac with a prior fem-fem bypass. This appears to be patent. Reproductive: The patient is status post hysterectomy. No adnexal masses or collections seen. Other: No evidence of abdominal wall mass or hernia. Musculoskeletal: No acute or significant osseous findings. IMPRESSION: 1. Hazy patchy airspace opacity at both lung bases which could be due to atelectasis and/or early infectious etiology. 2. No other acute intra-abdominal or pelvic pathology to explain the patient's symptoms. 3.  Aortic Atherosclerosis (ICD10-I70.0). Electronically Signed   By: Prudencio Pair M.D.   On: 07/16/2020 23:31    Diagnoses that have been ruled out:  None  Diagnoses that are still under consideration:  None  Final diagnoses:  Generalized abdominal pain  Constipation, unspecified constipation type  Hypokalemia  Dehydration   ED Discharge Orders         Ordered    ondansetron (ZOFRAN) 4  MG tablet  Every 8 hours PRN     Discontinue  Reprint     07/17/20 0319    potassium chloride SA (KLOR-CON) 20 MEQ tablet  2 times daily     Discontinue  Reprint     07/17/20 0319         Plan discharge  Rolland Porter, MD, Barbette Or, MD 07/17/20 [phone]

## 2020-08-08 DIAGNOSIS — E785 Hyperlipidemia, unspecified: Secondary | ICD-10-CM | POA: Diagnosis not present

## 2020-08-08 DIAGNOSIS — K219 Gastro-esophageal reflux disease without esophagitis: Secondary | ICD-10-CM | POA: Diagnosis not present

## 2020-08-10 NOTE — Progress Notes (Signed)
Referring Provider: [name], MD Primary Care Physician:  [name], MD Primary GI Physician: Dr. Abbey Chatters  Chief Complaint  Patient presents with  . Abdominal Pain    mid upper abd; feels like food gets stuck and sometimes comes back up; thinks she can feel knot    HPI:   Virginia Washington is a 64 y.o. female presenting today for abdominal pain and sensation of foods getting stuck in lower esophagus/upper abdomen.   History of peripheral artery disease, HLD, HTN, COPD, SBO in in 2017 and partial SBO in 2020 conservatively, constipation, GERD, dysphagia, chronic abdominal pain, and adenomatous colon polyps.  Last colonoscopy in March 2019 with two simple adenomas, external and internal hemorrhoids.  Next colonoscopy 2024-2026.  EGD May 2019 for dyspepsia and dysphagia with web in the distal esophagus s/p dilated, mild gastritis biopsied, normal examined duodenum. Pathology with gastritis, negative for H. Pylori.  Recommended omeprazole daily.  History of cholecystectomy.  Patient was seen in the emergency room at [facility] 07/16/2020 for abdominal pain, nausea/vomiting x2 days.  Pain was located in the RUQ and radiated around the entire diaphragmatic region.  She describes it as sharp and colicky.  Noted constipation with last BM 2-3 days ago.  CT A/P with contrast with no acute abnormalities, s/p cholecystectomy.  Labs remarkable for potassium 3.0, WBC 13.1, otherwise CBC and CMP essentially normal.  Lipase within normal limits.  She received potassium, IV fluids, fentanyl, and Zofran.  She was clinically improved and ready for discharge.  Advised to follow-up with PCP and GI.  Today:   Mid upper abdominal pain. Pain has been intermittent for a few days. Occurs after eating. Burning sensation. May last 30 minutes or less. Feels food is getting stuck in the lower esophagus/upper abdomen. This also started a few days ago. Food will come back up at times. Notes early satiety. States  she can't eat very much due to foods getting stuck. GERD symptoms about twice a week. Not taking anything for GERD. She used to take omeprazole but ran out of this "a while ago." Notes improvement in dysphagia/early satiety after last EGD with dilation.   Takes Meloxicam twice a week for wrist pain. Can't remember how long she has been on this. No other NSAIDs.   No unintentional weight loss.   BMs twice a day. Stools are soft and formed. Taking Linzess 290 mcg. No blood in the stool or black stool.   Past Medical History:  Diagnosis Date  . Chronic back pain   . Constipation   . Constipation   . COPD (chronic obstructive pulmonary disease) ([place])   . DVT (deep venous thrombosis) ([place])   . GERD (gastroesophageal reflux disease)   . Headache   . History of kidney stones   . Hyperlipidemia   . Hypertension     Past Surgical History:  Procedure Laterality Date  . ABDOMINAL AORTOGRAM W/LOWER EXTREMITY N/A 10/26/2017   Procedure: ABDOMINAL AORTOGRAM W/LOWER EXTREMITY;  Surgeon: Elam Dutch, MD;  Location: [place] [facility];  Service: Cardiovascular: 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  Normal right aortoiliac vessels.  Three-vessel runoff bilaterally.  (Sluggish flow of the left side due to occlusion.  . ABDOMINAL HYSTERECTOMY    . CHOLECYSTECTOMY    . COLONOSCOPY  2007   Dr. Laural Golden: small internal hemorrhoids, otherwise normal. exam could be compromised due to quality of prep  . COLONOSCOPY N/A 02/15/2018  Dr. Oneida Alar: 2 simple adenomas, external and internal hemorrhoids. Next colonoscopy 2024-2026  . ESOPHAGOGASTRODUODENOSCOPY N/A 05/09/2018   Procedure: ESOPHAGOGASTRODUODENOSCOPY (EGD);  Surgeon: Danie Binder, MD; web in the distal esophagus s/p dilated, mild gastritis biopsied, normal examined duodenum. Pathology with gastritis, negative for H. Pylori.    Lonia Chimera BYPASS GRAFT Bilateral 10/21/2018    Procedure: RIGHT TO LEFT FEMORAL ARTERY BYPASS GRAFT;  Surgeon: Elam Dutch, MD;  Location: [place];  Service: Vascular;  Laterality: Bilateral;  . LOWER EXTREMITY ANGIOGRAPHY N/A 09/27/2018   Procedure: LOWER EXTREMITY ANGIOGRAPHY;  Surgeon: Elam Dutch, MD;  Location: [place] [facility];  Service: Cardiovascular;  Laterality: N/A;  . NM MYOVIEW LTD  12/2014   [facility]: Normal Myoview.  Nonischemic.  Marland Kitchen TRANSTHORACIC ECHOCARDIOGRAM  12/2011   EF 55 to 60%.  No regional wall motion normality.  GRII DD.  No significant valve disease.    Current Outpatient Medications  Medication Sig Dispense Refill  . amLODipine (NORVASC) 5 MG tablet Take 5 mg by mouth daily.    Marland Kitchen atorvastatin (LIPITOR) 20 MG tablet Take 1 tablet (20 mg total) by mouth daily at 6 PM. 30 tablet 1  . LINZESS 290 MCG CAPS capsule TAKE 1 CAPSULE(290 MCG) BY MOUTH DAILY BEFORE BREAKFAST 90 capsule 3  . meloxicam (MOBIC) 7.5 MG tablet Take 7.5 mg by mouth daily.    . potassium chloride SA (KLOR-CON) 20 MEQ tablet Take 1 tablet (20 mEq total) by mouth 2 (two) times daily. 6 tablet 0  . pantoprazole (PROTONIX) 40 MG tablet Take 1 tablet (40 mg total) by mouth daily before breakfast. 30 tablet 5   No current facility-administered medications for this visit.    Allergies as of 08/11/2020  . (No Known Allergies)    Family History  Problem Relation Age of Onset  . Hypertension Mother   . Peripheral Artery Disease Brother   . Colon cancer Neg Hx   . Colon polyps Neg Hx     Social History   Socioeconomic History  . Marital status: Widowed    Spouse name: Not on file  . Number of children: Not on file  . Years of education: Not on file  . Highest education level: Not on file  Occupational History  . Occupation: unemployed    Comment: Draws pension from her husband's life insurance policy  Tobacco Use  . Smoking status: Current Every Day Smoker    Packs/day: 0.50    Years: 40.00    Pack years: 20.00     Types: Cigarettes  . Smokeless tobacco: Never Used  Vaping Use  . Vaping Use: Never used  Substance and Sexual Activity  . Alcohol use: No    Alcohol/week: 0.0 standard drinks  . Drug use: No  . Sexual activity: Not Currently  Other Topics Concern  . Not on file  Social History Narrative   Widowed: GETS HUSBAND'S PENSION.  mother 58, grandmother of 18 with one great grandchild.  She currently lives alone.  She completed ninth grade.  Smokes a pack a day for 42 years.  Tries to walks for about 5 minutes at x7 days a week with plans to help revascularization.       HOBBIES: PUZZLE BOOKS.   Social Determinants of Health   Financial Resource Strain:   . Difficulty of Paying Living Expenses: Not on file  Food Insecurity:   . Worried About Charity fundraiser in the Last Year: Not on file  .  Ran Out of Food in the Last Year: Not on file  Transportation Needs:   . Lack of Transportation (Medical): Not on file  . Lack of Transportation (Non-Medical): Not on file  Physical Activity:   . Days of Exercise per Week: Not on file  . Minutes of Exercise per Session: Not on file  Stress:   . Feeling of Stress : Not on file  Social Connections:   . Frequency of Communication with Friends and Family: Not on file  . Frequency of Social Gatherings with Friends and Family: Not on file  . Attends Religious Services: Not on file  . Active Member of Clubs or Organizations: Not on file  . Attends Archivist Meetings: Not on file  . Marital Status: Not on file    Review of Systems: Gen: Denies fever, chills, cold or flu like symptoms, pre-syncope, or syncope.  CV: Denies chest pain or palpitations Resp: Denies dyspnea at rest or cough GI: See HPI Derm: Denies rash Psych: Denies depression or anxiety Heme: See HPI  Physical Exam: BP 110/71   Pulse 65   Temp (!) 96.6 F (35.9 C) (Temporal)   Ht 5\' 2"  (1.575 m)   Wt 135 lb 9.6 oz (61.5 kg)   BMI 24.80 kg/m  General:   Alert  and oriented. No distress noted. Pleasant and cooperative.  Head:  Normocephalic and atraumatic. Eyes:  Conjuctiva clear without scleral icterus. Heart:  S1, S2 present without murmurs appreciated. Lungs:  Clear to auscultation bilaterally. No wheezes, rales, or rhonchi. No distress.  Abdomen:  +BS, soft, non-tender and non-distended. No rebound or guarding. No HSM or masses noted. Msk:  Symmetrical without gross deformities. Normal posture. Extremities:  Without edema. Neurologic:  Alert and  oriented x4 Psych: Normal mood and affect.

## 2020-08-11 ENCOUNTER — Ambulatory Visit (INDEPENDENT_AMBULATORY_CARE_PROVIDER_SITE_OTHER): Payer: Medicare HMO | Admitting: Gastroenterology

## 2020-08-11 ENCOUNTER — Other Ambulatory Visit: Payer: Self-pay

## 2020-08-11 ENCOUNTER — Encounter: Payer: Self-pay | Admitting: *Deleted

## 2020-08-11 ENCOUNTER — Encounter: Payer: Self-pay | Admitting: Gastroenterology

## 2020-08-11 VITALS — BP 110/71 | HR 65 | Temp 96.6°F | Ht 62.0 in | Wt 135.6 lb

## 2020-08-11 DIAGNOSIS — R6881 Early satiety: Secondary | ICD-10-CM | POA: Diagnosis not present

## 2020-08-11 DIAGNOSIS — R1013 Epigastric pain: Secondary | ICD-10-CM | POA: Insufficient documentation

## 2020-08-11 DIAGNOSIS — K219 Gastro-esophageal reflux disease without esophagitis: Secondary | ICD-10-CM | POA: Diagnosis not present

## 2020-08-11 DIAGNOSIS — K5900 Constipation, unspecified: Secondary | ICD-10-CM

## 2020-08-11 DIAGNOSIS — R1310 Dysphagia, unspecified: Secondary | ICD-10-CM | POA: Diagnosis not present

## 2020-08-11 MED ORDER — PANTOPRAZOLE SODIUM 40 MG PO TBEC: 40.0000 mg | DELAYED_RELEASE_TABLET | Freq: Every day | ORAL | 5 refills | Status: DC

## 2020-08-11 NOTE — Patient Instructions (Addendum)
We will get you scheduled for an upper endoscopy with possible dilation of your esophagus in the near future with Dr. Abbey Chatters.  For now, I recommend you follow a soft textured diet, eat slowly, take small bites, chew thoroughly, and have plenty of liquids throughout your meals.  Please start Protonix 40 mg daily 30 minutes before breakfast.  I sent a prescription to your pharmacy.  Follow a GERD diet:  Avoid fried, fatty, greasy, spicy, citrus foods. Avoid caffeine and carbonated beverages. Avoid chocolate. Try eating 4-6 small meals a day rather than 3 large meals. Do not eat within 3 hours of laying down. Prop head of bed up on wood or bricks to create a 6 inch incline.  Please limit meloxicam as much as possible and avoid all other NSAIDs including ibuprofen, Aleve, Advil, Goody powders, naproxen, and anything that says "NSAID" on the package.  You may take Tylenol as needed for pain.  No more than 3000 mg/day.  Continue taking Linzess 290 mcg daily.  We will follow up with you in the office after your procedure.  Do not hesitate to call with questions or concerns prior.  Aliene Altes, PA-C [facility]   Food Choices for Gastroesophageal Reflux Disease, Adult When you have gastroesophageal reflux disease (GERD), the foods you eat and your eating habits are very important. Choosing the right foods can help ease your discomfort. Think about working with a nutrition specialist (dietitian) to help you make good choices. What are tips for following this plan?  Meals  Choose healthy foods that are low in fat, such as fruits, vegetables, whole grains, low-fat dairy products, and lean meat, fish, and poultry.  Eat small meals often instead of 3 large meals a day. Eat your meals slowly, and in a place where you are relaxed. Avoid bending over or lying down until 2-3 hours after eating.  Avoid eating meals 2-3 hours before bed.  Avoid drinking a lot of liquid with  meals.  Cook foods using methods other than frying. Bake, grill, or broil food instead.  Avoid or limit: ? Chocolate. ? Peppermint or spearmint. ? Alcohol. ? Pepper. ? Black and decaffeinated coffee. ? Black and decaffeinated tea. ? Bubbly (carbonated) soft drinks. ? Caffeinated energy drinks and soft drinks.  Limit high-fat foods such as: ? Fatty meat or fried foods. ? Whole milk, cream, butter, or ice cream. ? Nuts and nut butters. ? Pastries, donuts, and sweets made with butter or shortening.  Avoid foods that cause symptoms. These foods may be different for everyone. Common foods that cause symptoms include: ? Tomatoes. ? Oranges, lemons, and limes. ? Peppers. ? Spicy food. ? Onions and garlic. ? Vinegar. Lifestyle  Maintain a healthy weight. Ask your doctor what weight is healthy for you. If you need to lose weight, work with your doctor to do so safely.  Exercise for at least 30 minutes for 5 or more days each week, or as told by your doctor.  Wear loose-fitting clothes.  Do not smoke. If you need help quitting, ask your doctor.  Sleep with the head of your bed higher than your feet. Use a wedge under the mattress or blocks under the bed frame to raise the head of the bed. Summary  When you have gastroesophageal reflux disease (GERD), food and lifestyle choices are very important in easing your symptoms.  Eat small meals often instead of 3 large meals a day. Eat your meals slowly, and in a place where you are  relaxed.  Limit high-fat foods such as fatty meat or fried foods.  Avoid bending over or lying down until 2-3 hours after eating.  Avoid peppermint and spearmint, caffeine, alcohol, and chocolate. This information is not intended to replace advice given to you by your health care provider. Make sure you discuss any questions you have with your health care provider. Document Revised: 03/27/2019 Document Reviewed: 01/09/2017 Elsevier Patient Education   [place].

## 2020-08-11 NOTE — Assessment & Plan Note (Signed)
Chronic. Not on a PPI currently. Notes GERD symptoms couple times a week, dyspepsia, early satiety, and dysphagia.  Meloxicam a couple times a week for wrist pain.  No BRBPR or melena.  Plan:  Start Protonix 40 mg daily 30 minutes before breakfast. Counseled on GERD diet/lifestyle. Advised to limit meloxicam as much as possible and use Tylenol as needed for pain. Proceed with EGD +/-dilation with propofol Dr. Abbey Chatters in the near future. The risks, benefits, and alternatives have been discussed in detail with patient. They have stated understanding and desire to proceed.  Follow-up after procedure.

## 2020-08-11 NOTE — Assessment & Plan Note (Addendum)
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  Patient reports good response to dilation but has had return of dysphagia with sensation of food sitting in the lower esophagus/upper abdomen requiring regurgitation at times.  Also notes early satiety secondary to foods not passing.  No trouble with liquids. Also with history of GERD with symptoms a couple times a week, not currently on a PPI. Taking Meloxicam a couple times a week.   Differentials include esophageal web, ring, stricture secondary to chronic GERD. Can't rule out EOE or achalasia. Less likely malignancy.   Plan:  Start Protonix 40 mg daily 30 minutes before breakfast. Advised to limit meloxicam as much as possible and use Tylenol as needed for pain. Proceed with EGD +/-dilation with propofol Dr. Abbey Chatters in the near future. The risks, benefits, and alternatives have been discussed in detail with patient. They have stated understanding and desire to proceed.  Follow-up after procedure.

## 2020-08-11 NOTE — Assessment & Plan Note (Signed)
Likely secondary to esophageal dysphagia as patient notes sensation of foods getting hung in the distal esophagus/upper abdomen preventing her from eating very much. Occasional food regurgitation. No trouble with liquids. No unintentional weight loss. 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 Notably, patient had similar symptoms prior to last dilation with good response until the last week.   Differential include esophageal web, ring, stricture, developing achalasia. Less likely malignancy.   Plan:  Proceed with EGD +/- dilation with propofol with Dr. Abbey Chatters in the near future. The risks, benefits, and alternatives have been discussed in detail with patient. They have stated understanding and desire to proceed.  ASA III Follow-up after procedure.

## 2020-08-11 NOTE — Assessment & Plan Note (Addendum)
Intermittent postprandial upper abdominal burning sensation likely multifactorial.  Currently taking meloxicam about twice a week.  Chronic history of GERD, not on a PPI.  Also notes early satiety and dysphagia with sensation of foods getting stuck, distal esophagus requiring food regurgitation at times.  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 Pylori). Recent CT A/P with contrast 07/16/20 with no acute abnormalities. Hemoglobin 14 on 7/30.   Most suspicious for gastritis, esophagitis, or duodenitis. Can't rule out PUD or H. pylori.   Today:  Start Protonix 40 mg daily 30 minutes before breakfast. Counseled on GERD diet/lifestyle. Advised to limit meloxicam as much as possible and use Tylenol as needed for pain. Proceed with EGD +/-dilation with propofol Dr. Abbey Chatters in the near future. The risks, benefits, and alternatives have been discussed in detail with patient. They have stated understanding and desire to proceed.  Follow-up after procedure.

## 2020-08-11 NOTE — Assessment & Plan Note (Signed)
Chronic.  Well-controlled Linzess 20 mcg daily.  No alarm symptoms.  Colonoscopy up-to-date.  Advised to continue current medications.

## 2020-08-13 DIAGNOSIS — R748 Abnormal levels of other serum enzymes: Secondary | ICD-10-CM | POA: Diagnosis not present

## 2020-08-13 DIAGNOSIS — R112 Nausea with vomiting, unspecified: Secondary | ICD-10-CM | POA: Diagnosis not present

## 2020-08-13 DIAGNOSIS — R109 Unspecified abdominal pain: Secondary | ICD-10-CM | POA: Diagnosis not present

## 2020-08-13 DIAGNOSIS — Z9049 Acquired absence of other specified parts of digestive tract: Secondary | ICD-10-CM | POA: Diagnosis not present

## 2020-08-13 DIAGNOSIS — R1084 Generalized abdominal pain: Secondary | ICD-10-CM | POA: Diagnosis not present

## 2020-08-13 DIAGNOSIS — R1013 Epigastric pain: Secondary | ICD-10-CM | POA: Diagnosis not present

## 2020-09-08 DIAGNOSIS — I10 Essential (primary) hypertension: Secondary | ICD-10-CM | POA: Diagnosis not present

## 2020-09-08 DIAGNOSIS — K219 Gastro-esophageal reflux disease without esophagitis: Secondary | ICD-10-CM | POA: Diagnosis not present

## 2020-09-23 ENCOUNTER — Other Ambulatory Visit (HOSPITAL_COMMUNITY): Payer: Self-pay | Admitting: Internal Medicine

## 2020-09-23 DIAGNOSIS — Z1231 Encounter for screening mammogram for malignant neoplasm of breast: Secondary | ICD-10-CM

## 2020-09-29 NOTE — Patient Instructions (Signed)
Virginia Washington  09/29/2020     @PREFPERIOPPHARMACY @   Your procedure is scheduled on  10/05/2020.  Report to [facility] at  [phone]  A.M.  Call this number if you have problems the morning of surgery:  [phone]   Remember:  Follow the diet instructions given to you by the office.                      Take these medicines the morning of surgery with A SIP OF WATER  Amlodipine, ditropan, protonix.    Do not wear jewelry, make-up or nail polish.  Do not wear lotions, powders, or perfumes. Please wear deodorant and brush your teeth.  Do not shave 48 hours prior to surgery.  Men may shave face and neck.  Do not bring valuables to the hospital.  [facility] is not responsible for any belongings or valuables.  Contacts, dentures or bridgework may not be worn into surgery.  Leave your suitcase in the car.  After surgery it may be brought to your room.  For patients admitted to the hospital, discharge time will be determined by your treatment team.  Patients discharged the day of surgery will not be allowed to drive home.   Name and phone number of your driver:   family Special instructions:   DO NOT smoke the morning of your procedure.  Please read over the following fact sheets that you were given. Anesthesia Post-op Instructions and Care and Recovery After Surgery       Upper Endoscopy, Adult, Care After This sheet gives you information about how to care for yourself after your procedure. Your health care provider may also give you more specific instructions. If you have problems or questions, contact your health care provider. What can I expect after the procedure? After the procedure, it is common to have:  A sore throat.  Mild stomach pain or discomfort.  Bloating.  Nausea. Follow these instructions at home:   Follow instructions from your health care provider about what to eat or drink after your procedure.  Return to your normal activities as  told by your health care provider. Ask your health care provider what activities are safe for you.  Take over-the-counter and prescription medicines only as told by your health care provider.  Do not drive for 24 hours if you were given a sedative during your procedure.  Keep all follow-up visits as told by your health care provider. This is important. Contact a health care provider if you have:  A sore throat that lasts longer than one day.  Trouble swallowing. Get help right away if:  You vomit blood or your vomit looks like coffee grounds.  You have: ? A fever. ? Bloody, black, or tarry stools. ? A severe sore throat or you cannot swallow. ? Difficulty breathing. ? Severe pain in your chest or abdomen. Summary  After the procedure, it is common to have a sore throat, mild stomach discomfort, bloating, and nausea.  Do not drive for 24 hours if you were given a sedative during the procedure.  Follow instructions from your health care provider about what to eat or drink after your procedure.  Return to your normal activities as told by your health care provider. This information is not intended to replace advice given to you by your health care provider. Make sure you discuss any questions you have with your health care provider. Document Revised:  05/28/2018 Document Reviewed: 05/06/2018 Elsevier Patient Education  [place].  Esophageal Dilatation Esophageal dilatation, also called esophageal dilation, is a procedure to widen or open (dilate) a blocked or narrowed part of the esophagus. The esophagus is the part of the body that moves food and liquid from the mouth to the stomach. You may need this procedure if:  You have a buildup of scar tissue in your esophagus that makes it difficult, painful, or impossible to swallow. This can be caused by gastroesophageal reflux disease (GERD).  You have cancer of the esophagus.  There is a problem with how food moves through  your esophagus. In some cases, you may need this procedure repeated at a later time to dilate the esophagus gradually. Tell a health care provider about:  Any allergies you have.  All medicines you are taking, including vitamins, herbs, eye drops, creams, and over-the-counter medicines.  Any problems you or family members have had with anesthetic medicines.  Any blood disorders you have.  Any surgeries you have had.  Any medical conditions you have.  Any antibiotic medicines you are required to take before dental procedures.  Whether you are pregnant or may be pregnant. What are the risks? Generally, this is a safe procedure. However, problems may occur, including:  Bleeding due to a tear in the lining of the esophagus.  A hole (perforation) in the esophagus. What happens before the procedure?  Follow instructions from your health care provider about eating or drinking restrictions.  Ask your health care provider about changing or stopping your regular medicines. This is especially important if you are taking diabetes medicines or blood thinners.  Plan to have someone take you home from the hospital or clinic.  Plan to have a responsible adult care for you for at least 24 hours after you leave the hospital or clinic. This is important. What happens during the procedure?  You may be given a medicine to help you relax (sedative).  A numbing medicine may be sprayed into the back of your throat, or you may gargle the medicine.  Your health care provider may perform the dilatation using various surgical instruments, such as: ? Simple dilators. This instrument is carefully placed in the esophagus to stretch it. ? Guided wire bougies. This involves using an endoscope to insert a wire into the esophagus. A dilator is passed over this wire to enlarge the esophagus. Then the wire is removed. ? Balloon dilators. An endoscope with a small balloon at the end is inserted into the  esophagus. The balloon is inflated to stretch the esophagus and open it up. The procedure may vary among health care providers and hospitals. What happens after the procedure?  Your blood pressure, heart rate, breathing rate, and blood oxygen level will be monitored until the medicines you were given have worn off.  Your throat may feel slightly sore and numb. This will improve slowly over time.  You will not be allowed to eat or drink until your throat is no longer numb.  When you are able to drink, urinate, and sit on the edge of the bed without nausea or dizziness, you may be able to return home. Follow these instructions at home:  Take over-the-counter and prescription medicines only as told by your health care provider.  Do not drive for 24 hours if you were given a sedative during your procedure.  You should have a responsible adult with you for 24 hours after the procedure.  Follow instructions from  your health care provider about any eating or drinking restrictions.  Do not use any products that contain nicotine or tobacco, such as cigarettes and e-cigarettes. If you need help quitting, ask your health care provider.  Keep all follow-up visits as told by your health care provider. This is important. Get help right away if you:  Have a fever.  Have chest pain.  Have pain that is not relieved by medication.  Have trouble breathing.  Have trouble swallowing.  Vomit blood. Summary  Esophageal dilatation, also called esophageal dilation, is a procedure to widen or open (dilate) a blocked or narrowed part of the esophagus.  Plan to have someone take you home from the hospital or clinic.  For this procedure, a numbing medicine may be sprayed into the back of your throat, or you may gargle the medicine.  Do not drive for 24 hours if you were given a sedative during your procedure. This information is not intended to replace advice given to you by your health care  provider. Make sure you discuss any questions you have with your health care provider. Document Revised: 10/01/2019 Document Reviewed: 10/09/2017 Elsevier Patient Education  2020 [place] After These instructions provide you with information about caring for yourself after your procedure. Your health care provider may also give you more specific instructions. Your treatment has been planned according to current medical practices, but problems sometimes occur. Call your health care provider if you have any problems or questions after your procedure. What can I expect after the procedure? After your procedure, you may:  Feel sleepy for several hours.  Feel clumsy and have poor balance for several hours.  Feel forgetful about what happened after the procedure.  Have poor judgment for several hours.  Feel nauseous or vomit.  Have a sore throat if you had a breathing tube during the procedure. Follow these instructions at home: For at least 24 hours after the procedure:      Have a responsible adult stay with you. It is important to have someone help care for you until you are awake and alert.  Rest as needed.  Do not: ? Participate in activities in which you could fall or become injured. ? Drive. ? Use heavy machinery. ? Drink alcohol. ? Take sleeping pills or medicines that cause drowsiness. ? Make important decisions or sign legal documents. ? Take care of children on your own. Eating and drinking  Follow the diet that is recommended by your health care provider.  If you vomit, drink water, juice, or soup when you can drink without vomiting.  Make sure you have little or no nausea before eating solid foods. General instructions  Take over-the-counter and prescription medicines only as told by your health care provider.  If you have sleep apnea, surgery and certain medicines can increase your risk for breathing problems. Follow  instructions from your health care provider about wearing your sleep device: ? Anytime you are sleeping, including during daytime naps. ? While taking prescription pain medicines, sleeping medicines, or medicines that make you drowsy.  If you smoke, do not smoke without supervision.  Keep all follow-up visits as told by your health care provider. This is important. Contact a health care provider if:  You keep feeling nauseous or you keep vomiting.  You feel light-headed.  You develop a rash.  You have a fever. Get help right away if:  You have trouble breathing. Summary  For several hours after your  procedure, you may feel sleepy and have poor judgment.  Have a responsible adult stay with you for at least 24 hours or until you are awake and alert. This information is not intended to replace advice given to you by your health care provider. Make sure you discuss any questions you have with your health care provider. Document Revised: 03/04/2018 Document Reviewed: 03/26/2016 Elsevier Patient Education  [place].

## 2020-10-01 ENCOUNTER — Encounter (HOSPITAL_COMMUNITY)
Admission: RE | Admit: 2020-10-01 | Discharge: 2020-10-01 | Disposition: A | Discharge status: Home / Self Care | Payer: Medicare HMO | Source: Ambulatory Visit | Attending: Internal Medicine | Admitting: Internal Medicine

## 2020-10-01 ENCOUNTER — Encounter (HOSPITAL_COMMUNITY): Payer: Self-pay

## 2020-10-01 ENCOUNTER — Other Ambulatory Visit: Payer: Self-pay

## 2020-10-01 ENCOUNTER — Other Ambulatory Visit (HOSPITAL_COMMUNITY)
Admission: RE | Admit: 2020-10-01 | Discharge: 2020-10-01 | Disposition: A | Discharge status: Home / Self Care | Payer: Medicaid Other | Source: Ambulatory Visit | Attending: Internal Medicine | Admitting: Internal Medicine

## 2020-10-01 DIAGNOSIS — Z01812 Encounter for preprocedural laboratory examination: Secondary | ICD-10-CM | POA: Insufficient documentation

## 2020-10-01 DIAGNOSIS — Z20822 Contact with and (suspected) exposure to covid-19: Secondary | ICD-10-CM | POA: Insufficient documentation

## 2020-10-01 LAB — SARS CORONAVIRUS 2 (TAT 6-24 HRS): SARS Coronavirus 2: NEGATIVE

## 2020-10-05 ENCOUNTER — Encounter (HOSPITAL_COMMUNITY)
Admission: RE | Disposition: A | Discharge status: Home / Self Care | Payer: Self-pay | Source: Home / Self Care | Attending: Internal Medicine

## 2020-10-05 ENCOUNTER — Encounter (HOSPITAL_COMMUNITY): Payer: Self-pay

## 2020-10-05 ENCOUNTER — Ambulatory Visit (HOSPITAL_COMMUNITY): Payer: Medicare HMO | Admitting: Anesthesiology

## 2020-10-05 ENCOUNTER — Ambulatory Visit (HOSPITAL_COMMUNITY)
Admission: RE | Admit: 2020-10-05 | Discharge: 2020-10-05 | Disposition: A | Discharge status: Home / Self Care | Payer: Medicare HMO | Attending: Internal Medicine | Admitting: Internal Medicine

## 2020-10-05 DIAGNOSIS — Z9049 Acquired absence of other specified parts of digestive tract: Secondary | ICD-10-CM | POA: Insufficient documentation

## 2020-10-05 DIAGNOSIS — K2970 Gastritis, unspecified, without bleeding: Secondary | ICD-10-CM | POA: Insufficient documentation

## 2020-10-05 DIAGNOSIS — Z8249 Family history of ischemic heart disease and other diseases of the circulatory system: Secondary | ICD-10-CM | POA: Diagnosis not present

## 2020-10-05 DIAGNOSIS — K222 Esophageal obstruction: Secondary | ICD-10-CM | POA: Diagnosis not present

## 2020-10-05 DIAGNOSIS — F17210 Nicotine dependence, cigarettes, uncomplicated: Secondary | ICD-10-CM | POA: Diagnosis not present

## 2020-10-05 DIAGNOSIS — E785 Hyperlipidemia, unspecified: Secondary | ICD-10-CM | POA: Diagnosis not present

## 2020-10-05 DIAGNOSIS — I10 Essential (primary) hypertension: Secondary | ICD-10-CM | POA: Diagnosis not present

## 2020-10-05 DIAGNOSIS — G8929 Other chronic pain: Secondary | ICD-10-CM | POA: Diagnosis not present

## 2020-10-05 DIAGNOSIS — K449 Diaphragmatic hernia without obstruction or gangrene: Secondary | ICD-10-CM | POA: Insufficient documentation

## 2020-10-05 DIAGNOSIS — R1310 Dysphagia, unspecified: Secondary | ICD-10-CM | POA: Diagnosis not present

## 2020-10-05 DIAGNOSIS — M549 Dorsalgia, unspecified: Secondary | ICD-10-CM | POA: Diagnosis not present

## 2020-10-05 DIAGNOSIS — K30 Functional dyspepsia: Secondary | ICD-10-CM

## 2020-10-05 DIAGNOSIS — K3189 Other diseases of stomach and duodenum: Secondary | ICD-10-CM | POA: Diagnosis not present

## 2020-10-05 DIAGNOSIS — J449 Chronic obstructive pulmonary disease, unspecified: Secondary | ICD-10-CM | POA: Diagnosis not present

## 2020-10-05 DIAGNOSIS — Z86718 Personal history of other venous thrombosis and embolism: Secondary | ICD-10-CM | POA: Diagnosis not present

## 2020-10-05 DIAGNOSIS — K2100 Gastro-esophageal reflux disease with esophagitis, without bleeding: Secondary | ICD-10-CM | POA: Diagnosis not present

## 2020-10-05 DIAGNOSIS — K219 Gastro-esophageal reflux disease without esophagitis: Secondary | ICD-10-CM | POA: Insufficient documentation

## 2020-10-05 DIAGNOSIS — R519 Headache, unspecified: Secondary | ICD-10-CM | POA: Diagnosis not present

## 2020-10-05 DIAGNOSIS — Z79899 Other long term (current) drug therapy: Secondary | ICD-10-CM | POA: Insufficient documentation

## 2020-10-05 DIAGNOSIS — K5900 Constipation, unspecified: Secondary | ICD-10-CM | POA: Diagnosis not present

## 2020-10-05 DIAGNOSIS — Z87442 Personal history of urinary calculi: Secondary | ICD-10-CM | POA: Diagnosis not present

## 2020-10-05 DIAGNOSIS — Z90710 Acquired absence of both cervix and uterus: Secondary | ICD-10-CM | POA: Insufficient documentation

## 2020-10-05 DIAGNOSIS — R6881 Early satiety: Secondary | ICD-10-CM | POA: Diagnosis not present

## 2020-10-05 DIAGNOSIS — I739 Peripheral vascular disease, unspecified: Secondary | ICD-10-CM | POA: Insufficient documentation

## 2020-10-05 HISTORY — PX: ESOPHAGOGASTRODUODENOSCOPY (EGD) WITH PROPOFOL: SHX5813

## 2020-10-05 HISTORY — PX: BIOPSY: SHX5522

## 2020-10-05 HISTORY — PX: BALLOON DILATION: SHX5330

## 2020-10-05 SURGERY — ESOPHAGOGASTRODUODENOSCOPY (EGD) WITH PROPOFOL
Anesthesia: General

## 2020-10-05 MED ORDER — GLYCOPYRROLATE 0.2 MG/ML IJ SOLN
0.2000 mg | Freq: Once | INTRAMUSCULAR | Status: AC
  Administered 2020-10-05: 0.2 mg via INTRAVENOUS

## 2020-10-05 MED ORDER — LIDOCAINE VISCOUS HCL 2 % MT SOLN
15.0000 mL | Freq: Once | OROMUCOSAL | Status: AC
  Administered 2020-10-05: 15 mL via OROMUCOSAL

## 2020-10-05 MED ORDER — GLYCOPYRROLATE 0.2 MG/ML IJ SOLN
INTRAMUSCULAR | Status: AC
  Filled 2020-10-05: qty 1

## 2020-10-05 MED ORDER — LIDOCAINE HCL (CARDIAC) PF 50 MG/5ML IV SOSY
PREFILLED_SYRINGE | INTRAVENOUS | Status: DC | PRN
  Administered 2020-10-05: 60 mg via INTRAVENOUS

## 2020-10-05 MED ORDER — STERILE WATER FOR IRRIGATION IR SOLN
Status: DC | PRN
  Administered 2020-10-05: 100 mL

## 2020-10-05 MED ORDER — CHLORHEXIDINE GLUCONATE CLOTH 2 % EX PADS: 6.0000 | MEDICATED_PAD | Freq: Once | CUTANEOUS | Status: DC

## 2020-10-05 MED ORDER — PROPOFOL 10 MG/ML IV BOLUS
INTRAVENOUS | Status: DC | PRN
  Administered 2020-10-05: 150 ug/kg/min via INTRAVENOUS
  Administered 2020-10-05: 100 mg via INTRAVENOUS

## 2020-10-05 MED ORDER — LACTATED RINGERS IV SOLN
INTRAVENOUS | Status: DC
  Administered 2020-10-05: 1000 mL via INTRAVENOUS

## 2020-10-05 MED ORDER — PANTOPRAZOLE SODIUM 40 MG PO TBEC: 40.0000 mg | DELAYED_RELEASE_TABLET | Freq: Two times a day (BID) | ORAL | 5 refills | Status: DC

## 2020-10-05 MED ORDER — LIDOCAINE VISCOUS HCL 2 % MT SOLN
OROMUCOSAL | Status: AC
  Filled 2020-10-05: qty 15

## 2020-10-05 NOTE — Op Note (Signed)
[facility] Patient Name: Virginia Washington Procedure Date: 10/05/2020 8:33 AM MRN: [id number] Date of Birth: [birth year] Attending MD: Virginia Alas. Abbey Chatters [place] CSN: [id number] Age: 64 Admit Type: Outpatient Procedure:                Upper GI endoscopy Indications:              Functional Dyspepsia, Dysphagia, Early satiety Providers:                Virginia Alas. Abbey Chatters, [place], [place] Washington, [place] Washington, Technician, [place] Washington,                            Technician Referring MD:              Medicines:                See the Anesthesia note for documentation of the                            administered medications Complications:            No immediate complications. Estimated Blood Loss:     Estimated blood loss was minimal. Procedure:                Pre-Anesthesia Assessment:                           - The anesthesia plan was to use monitored                            anesthesia care (MAC).                           After obtaining informed consent, the endoscope was                            passed under direct vision. Throughout the                            procedure, the patient's blood pressure, pulse, and                            oxygen saturations were monitored continuously. The                            Endoscope was introduced through the mouth, and                            advanced to the second part of duodenum. The upper                            GI endoscopy was accomplished without difficulty.                            The patient tolerated the procedure well.  Scope In: 8:41:46 AM Scope Out: 8:47:23 AM Total Procedure Duration: 0 hours 5 minutes 37 seconds  Findings:      A small hiatal hernia was present.      LA Grade B (one or more mucosal breaks greater than 5 mm, not extending       between the tops of two mucosal folds) esophagitis with no bleeding was       found at the gastroesophageal junction.       One benign-appearing, intrinsic mild stenosis was found in the lower       third of the esophagus. The stenosis was traversed. A TTS dilator was       passed through the scope. Dilation with an 18-19-20 mm balloon dilator       was performed to 20 mm. The dilation site was examined and showed       moderate improvement in luminal narrowing.      Diffuse moderate inflammation characterized by erythema and friability       was found in the entire examined stomach. Biopsies were taken with a       cold forceps for Helicobacter pylori testing.      The duodenal bulb, first portion of the duodenum and second portion of       the duodenum were normal. Impression:               - Small hiatal hernia.                           - LA Grade B reflux esophagitis with no bleeding.                           - Benign-appearing esophageal stenosis. Dilated.                           - Gastritis. Biopsied.                           - Normal duodenal bulb, first portion of the                            duodenum and second portion of the duodenum. Moderate Sedation:      Per Anesthesia Care Recommendation:           - Patient has a contact number available for                            emergencies. The signs and symptoms of potential                            delayed complications were discussed with the                            patient. Return to normal activities tomorrow.                            Written discharge instructions were provided to the  patient.                           - Resume previous diet.                           - Continue present medications.                           - Use Protonix (pantoprazole) 40 mg PO BID for 8                            weeks.                           - Await pathology results.                           - Repeat upper endoscopy PRN for retreatment.                           - Return to GI office in 8 weeks with Virginia Washington. Procedure Code(s):        --- Professional ---                           [phone], Esophagogastroduodenoscopy, flexible,                            transoral; with transendoscopic balloon dilation of                            esophagus (less than 30 mm diameter)                           [id number], 59, Esophagogastroduodenoscopy, flexible,                            transoral; with biopsy, single or multiple Diagnosis Code(s):        --- Professional ---                           K44.9, Diaphragmatic hernia without obstruction or                            gangrene                           K21.00, Gastro-esophageal reflux disease with                            esophagitis, without bleeding                           K22.2, Esophageal obstruction  K29.70, Gastritis, unspecified, without bleeding                           K30, Functional dyspepsia                           R13.10, Dysphagia, unspecified                           R68.81, Early satiety CPT copyright 2019 American Medical Association. All rights reserved. The codes documented in this report are preliminary and upon coder review may  be revised to meet current compliance requirements. Virginia Washington [place] Washington 10/05/2020 8:51:40 AM This report has been signed electronically. Number of Addenda: 0

## 2020-10-05 NOTE — Anesthesia Preprocedure Evaluation (Signed)
Anesthesia Evaluation  Patient identified by MRN, date of birth, ID band Patient awake    Reviewed: Allergy & Precautions, H&P , NPO status , Patient's Chart, lab work & pertinent test results, reviewed documented beta blocker date and time   Airway Mallampati: II  TM Distance: >3 FB Neck ROM: full    Dental no notable dental hx. (+) Edentulous Upper, Edentulous Lower   Pulmonary COPD, Current Smoker and Patient abstained from smoking.,    Pulmonary exam normal breath sounds clear to auscultation       Cardiovascular Exercise Tolerance: Good hypertension, + Peripheral Vascular Disease   Rhythm:regular Rate:Normal     Neuro/Psych  Headaches, negative psych ROS   GI/Hepatic Neg liver ROS, GERD  Medicated,  Endo/Other  negative endocrine ROS  Renal/GU negative Renal ROS  negative genitourinary   Musculoskeletal   Abdominal   Peds  Hematology negative hematology ROS (+)   Anesthesia Other Findings   Reproductive/Obstetrics negative OB ROS                             Anesthesia Physical Anesthesia Plan  ASA: II  Anesthesia Plan: General   Post-op Pain Management:    Induction:   PONV Risk Score and Plan: Propofol infusion  Airway Management Planned:   Additional Equipment:   Intra-op Plan:   Post-operative Plan:   Informed Consent: I have reviewed the patients History and Physical, chart, labs and discussed the procedure including the risks, benefits and alternatives for the proposed anesthesia with the patient or authorized representative who has indicated his/her understanding and acceptance.     Dental Advisory Given  Plan Discussed with: CRNA  Anesthesia Plan Comments:         Anesthesia Quick Evaluation

## 2020-10-05 NOTE — Anesthesia Postprocedure Evaluation (Signed)
Anesthesia Post Note  Patient: Virginia Washington  Procedure(s) Performed: ESOPHAGOGASTRODUODENOSCOPY (EGD) WITH PROPOFOL (N/A ) BALLOON DILATION (N/A ) BIOPSY  Patient location during evaluation: PACU Anesthesia Type: General Level of consciousness: awake, oriented, awake and alert and patient cooperative Pain management: pain level controlled Vital Signs Assessment: post-procedure vital signs reviewed and stable Respiratory status: spontaneous breathing, respiratory function stable and nonlabored ventilation Cardiovascular status: blood pressure returned to baseline and stable Postop Assessment: no headache and no backache Anesthetic complications: no   No complications documented.   Last Vitals:  Vitals:   10/05/20 0732  BP: 115/65  Resp: 11  Temp: 37 C  SpO2: 96%    Last Pain:  Vitals:   10/05/20 0838  TempSrc:   PainSc: 0-No pain                 Tacy Learn

## 2020-10-05 NOTE — H&P (Signed)
Primary Care Physician:  [name], MD Primary Gastroenterologist:  Dr. Abbey Chatters  Pre-Procedure History & Physical: HPI:  Virginia Washington is a 64 y.o. female is here for an  EGD to be performed for dyspepsia, dysphagia, early satiety  Past Medical History:  Diagnosis Date  . Chronic back pain   . Constipation   . Constipation   . COPD (chronic obstructive pulmonary disease) ([place])   . DVT (deep venous thrombosis) ([place])   . GERD (gastroesophageal reflux disease)   . Headache   . History of kidney stones   . Hyperlipidemia   . Hypertension     Past Surgical History:  Procedure Laterality Date  . 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  Normal right aortoiliac vessels.  Three-vessel runoff bilaterally.  (Sluggish flow of the left side due to occlusion.  . ABDOMINAL HYSTERECTOMY    . CHOLECYSTECTOMY    . COLONOSCOPY  2007   Dr. Laural Golden: small internal hemorrhoids, otherwise normal. exam could be compromised due to quality of prep  . COLONOSCOPY N/A 02/15/2018   Dr. Oneida Alar: 2 simple adenomas, external and internal hemorrhoids. Next colonoscopy 2024-2026  . ESOPHAGOGASTRODUODENOSCOPY N/A 05/09/2018   Procedure: ESOPHAGOGASTRODUODENOSCOPY (EGD);  Surgeon: Danie Binder, MD; web in the distal esophagus s/p dilated, mild gastritis biopsied, normal examined duodenum. Pathology with gastritis, negative for H. Pylori.    Lonia Chimera BYPASS GRAFT Bilateral 10/21/2018   Procedure: RIGHT TO LEFT FEMORAL ARTERY BYPASS GRAFT;  Surgeon: Elam Dutch, MD;  Location: [facility];  Service: Vascular;  Laterality: Bilateral;  . LOWER EXTREMITY ANGIOGRAPHY N/A 09/27/2018   Procedure: LOWER EXTREMITY ANGIOGRAPHY;  Surgeon: Elam Dutch, MD;   Location: [facility];  Service: Cardiovascular;  Laterality: N/A;  . NM MYOVIEW LTD  12/2014   [facility]: Normal Myoview.  Nonischemic.  Marland Kitchen TRANSTHORACIC ECHOCARDIOGRAM  12/2011   EF 55 to 60%.  No regional wall motion normality.  GRII DD.  No significant valve disease.    Prior to Admission medications   Medication Sig Start Date End Date Taking? Authorizing Provider  amLODipine (NORVASC) 5 MG tablet Take 5 mg by mouth daily.   Yes [provider]  atorvastatin (LIPITOR) 20 MG tablet Take 1 tablet (20 mg total) by mouth daily at 6 PM. 06/02/19  Yes Barton Dubois, MD  oxybutynin (DITROPAN-XL) 5 MG 24 hr tablet Take 5 mg by mouth daily. 07/15/20  Yes [provider]  pantoprazole (PROTONIX) 40 MG tablet Take 1 tablet (40 mg total) by mouth daily before breakfast. 08/11/20  Yes Erenest Rasher, PA-C  LINZESS 290 MCG CAPS capsule TAKE 1 CAPSULE(290 MCG) BY MOUTH DAILY BEFORE BREAKFAST Patient not taking: Reported on 09/24/2020 09/15/19   Mahala Menghini, PA-C  potassium chloride SA (KLOR-CON) 20 MEQ tablet Take 1 tablet (20 mEq total) by mouth 2 (two) times daily. Patient not taking: Reported on 09/24/2020 07/17/20   Rolland Porter, MD    Allergies as of 08/11/2020  . (No Known Allergies)    Family History  Problem Relation Age of Onset  . Hypertension Mother   . Peripheral Artery Disease Brother   . Colon cancer Neg Hx   . Colon polyps Neg Hx     Social History   Socioeconomic History  .  Marital status: Widowed    Spouse name: Not on file  . Number of children: Not on file  . Years of education: Not on file  . Highest education level: Not on file  Occupational History  . Occupation: unemployed    Comment: Draws pension from her husband's life insurance policy  Tobacco Use  . Smoking status: Current Every Day Smoker    Packs/day: 0.50    Years: 40.00    Pack years: 20.00    Types: Cigarettes  . Smokeless tobacco: Never Used  Vaping Use  .  Vaping Use: Never used  Substance and Sexual Activity  . Alcohol use: No    Alcohol/week: 0.0 standard drinks  . Drug use: No  . Sexual activity: Not Currently  Other Topics Concern  . Not on file  Social History Narrative   Widowed: GETS HUSBAND'S PENSION.  mother 49, grandmother of 19 with one great grandchild.  She currently lives alone.  She completed ninth grade.  Smokes a pack a day for 42 years.  Tries to walks for about 5 minutes at x7 days a week with plans to help revascularization.       HOBBIES: PUZZLE BOOKS.   Social Determinants of Health   Financial Resource Strain:   . Difficulty of Paying Living Expenses: Not on file  Food Insecurity:   . Worried About Charity fundraiser in the Last Year: Not on file  . Ran Out of Food in the Last Year: Not on file  Transportation Needs:   . Lack of Transportation (Medical): Not on file  . Lack of Transportation (Non-Medical): Not on file  Physical Activity:   . Days of Exercise per Week: Not on file  . Minutes of Exercise per Session: Not on file  Stress:   . Feeling of Stress : Not on file  Social Connections:   . Frequency of Communication with Friends and Family: Not on file  . Frequency of Social Gatherings with Friends and Family: Not on file  . Attends Religious Services: Not on file  . Active Member of Clubs or Organizations: Not on file  . Attends Archivist Meetings: Not on file  . Marital Status: Not on file  Intimate Partner Violence:   . Fear of Current or Ex-Partner: Not on file  . Emotionally Abused: Not on file  . Physically Abused: Not on file  . Sexually Abused: Not on file    Review of Systems: See HPI, otherwise negative ROS  Impression/Plan: Virginia Washington is here for an  EGD to be performed for dyspepsia, dysphagia, early satiety  Risks, benefits, limitations, imponderables and alternatives regarding EGD have been reviewed with the patient. Questions have been answered. All parties  agreeable.

## 2020-10-05 NOTE — Transfer of Care (Signed)
Immediate Anesthesia Transfer of Care Note  Patient: Virginia Washington  Procedure(s) Performed: ESOPHAGOGASTRODUODENOSCOPY (EGD) WITH PROPOFOL (N/A ) BALLOON DILATION (N/A ) BIOPSY  Patient Location: PACU  Anesthesia Type:General  Level of Consciousness: awake, alert , oriented and patient cooperative  Airway & Oxygen Therapy: Patient Spontanous Breathing  Post-op Assessment: Report given to RN, Post -op Vital signs reviewed and stable and Patient moving all extremities  Post vital signs: Reviewed and stable  Last Vitals:  Vitals Value Taken Time  BP    Temp    Pulse    Resp    SpO2      Last Pain:  Vitals:   10/05/20 0838  TempSrc:   PainSc: 0-No pain      Patients Stated Pain Goal: 9 (47/12/52 7129)  Complications: No complications documented.

## 2020-10-05 NOTE — Discharge Instructions (Signed)
EGD Discharge instructions Please read the instructions outlined below and refer to this sheet in the next few weeks. These discharge instructions provide you with general information on caring for yourself after you leave the hospital. Your doctor may also give you specific instructions. While your treatment has been planned according to the most current medical practices available, unavoidable complications occasionally occur. If you have any problems or questions after discharge, please call your doctor. ACTIVITY  You may resume your regular activity but move at a slower pace for the next 24 hours.   Take frequent rest periods for the next 24 hours.   Walking will help expel (get rid of) the air and reduce the bloated feeling in your abdomen.   No driving for 24 hours (because of the anesthesia (medicine) used during the test).   You may shower.   Do not sign any important legal documents or operate any machinery for 24 hours (because of the anesthesia used during the test).  NUTRITION  Drink plenty of fluids.   You may resume your normal diet.   Begin with a light meal and progress to your normal diet.   Avoid alcoholic beverages for 24 hours or as instructed by your caregiver.  MEDICATIONS  You may resume your normal medications unless your caregiver tells you otherwise.  WHAT YOU CAN EXPECT TODAY  You may experience abdominal discomfort such as a feeling of fullness or "gas" pains.  FOLLOW-UP  Your doctor will discuss the results of your test with you.  SEEK IMMEDIATE MEDICAL ATTENTION IF ANY OF THE FOLLOWING OCCUR:  Excessive nausea (feeling sick to your stomach) and/or vomiting.   Severe abdominal pain and distention (swelling).   Trouble swallowing.   Temperature over 101 F (37.8 C).   Rectal bleeding or vomiting of blood.   Your EGD showed inflammation both in your esophagus and stomach consistent with gastroesophageal reflux disease.  He also have a small  hiatal hernia.  It appeared that your esophagus had a slight narrowing so I dilated this with a balloon.  Hopefully this will help with your swallowing.  I recommend increasing Protonix to 40 mg twice daily for the next 8 weeks and then can go back to daily thereafter.  Avoid NSAIDs.  Follow-up with Aliene Altes at next available.  I hope you have a great rest of your week!  Virginia Washington. Abbey Chatters, D.O. Gastroenterology and Hepatology [facility]

## 2020-10-06 ENCOUNTER — Other Ambulatory Visit: Payer: Self-pay

## 2020-10-06 LAB — SURGICAL PATHOLOGY

## 2020-10-07 ENCOUNTER — Ambulatory Visit (HOSPITAL_COMMUNITY)
Admission: RE | Admit: 2020-10-07 | Discharge: 2020-10-07 | Disposition: A | Discharge status: Home / Self Care | Payer: Medicare HMO | Source: Ambulatory Visit | Attending: Internal Medicine | Admitting: Internal Medicine

## 2020-10-07 ENCOUNTER — Other Ambulatory Visit: Payer: Self-pay

## 2020-10-07 DIAGNOSIS — Z1231 Encounter for screening mammogram for malignant neoplasm of breast: Secondary | ICD-10-CM | POA: Insufficient documentation

## 2020-10-08 ENCOUNTER — Encounter (HOSPITAL_COMMUNITY): Payer: Self-pay | Admitting: Internal Medicine

## 2020-10-08 DIAGNOSIS — I10 Essential (primary) hypertension: Secondary | ICD-10-CM | POA: Diagnosis not present

## 2020-10-08 DIAGNOSIS — K219 Gastro-esophageal reflux disease without esophagitis: Secondary | ICD-10-CM | POA: Diagnosis not present

## 2020-10-10 ENCOUNTER — Other Ambulatory Visit: Payer: Self-pay | Admitting: Gastroenterology

## 2020-10-11 ENCOUNTER — Ambulatory Visit (HOSPITAL_COMMUNITY): Payer: Medicare HMO

## 2020-10-13 DIAGNOSIS — Z23 Encounter for immunization: Secondary | ICD-10-CM | POA: Diagnosis not present

## 2020-10-27 DIAGNOSIS — F17210 Nicotine dependence, cigarettes, uncomplicated: Secondary | ICD-10-CM | POA: Diagnosis not present

## 2020-10-27 DIAGNOSIS — J441 Chronic obstructive pulmonary disease with (acute) exacerbation: Secondary | ICD-10-CM | POA: Diagnosis not present

## 2020-10-27 DIAGNOSIS — F17200 Nicotine dependence, unspecified, uncomplicated: Secondary | ICD-10-CM | POA: Diagnosis not present

## 2020-11-19 ENCOUNTER — Encounter (HOSPITAL_COMMUNITY): Payer: Self-pay | Admitting: Internal Medicine

## 2020-11-28 NOTE — Progress Notes (Deleted)
Referring Provider: [name], MD Primary Care Physician:  [name], MD Primary GI Physician: Dr. Abbey Chatters  No chief complaint on file.   HPI:   Virginia Washington is a 64 y.o. female with history of SBO in in 2017 and partial SBO in 2020 managed conservatively, constipation, GERD, dysphagia requiring dilations, chronic abdominal pain, and adenomatous colon polyps.  Last colonoscopy in March 2019 with two simple adenomas, external and internal hemorrhoids.  Next colonoscopy 2024-2026.  History of cholecystectomy.  She is presenting today for follow-up of dysphagia, dyspepsia, GERD, early satiety.    Patient was last seen 08/11/2020.  She reported intermittent postprandial burning sensation in the upper abdomen for the last few days lasting 30 minutes or less.  Foods getting stuck in the lower esophagus/upper abdominal region with regurgitation at times.  GERD about twice a week not on any regimen for this.  Meloxicam twice a week for wrist pain.  Linzess 290 mcg working well for constipation.  Plan included Protonix 40 mg daily, limit meloxicam, and EGD/ED.  EGD 10/05/2020: Small hiatal hernia, LA grade B esophagitis, mild stenosis in the lower third of the esophagus s/p dilation, gastritis s/p biopsied (mild nonspecific reactive gastropathy, negative H pylori), normal examined duodenum.  Recommended Protonix 40 mg twice daily x8 weeks.  Today:   Dysphagia:  GERD:   Dyspepsia:      Past Medical History:  Diagnosis Date  . Chronic back pain   . Constipation   . Constipation   . COPD (chronic obstructive pulmonary disease) ([place])   . DVT (deep venous thrombosis) ([place])   . GERD (gastroesophageal reflux disease)   . Headache   . History of kidney stones   . Hyperlipidemia   . Hypertension     Past Surgical History:  Procedure Laterality Date  . 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  Normal right aortoiliac vessels.  Three-vessel runoff bilaterally.  (Sluggish flow of the left side due to occlusion.  . ABDOMINAL HYSTERECTOMY    . BALLOON DILATION N/A 10/05/2020   Procedure: BALLOON DILATION;  Surgeon: [name], DO;  [facility];  Service: Endoscopy;  Laterality: N/A;  . BIOPSY  10/05/2020   Procedure: BIOPSY;  Surgeon: [name], DO;  [facility];  Service: Endoscopy;;  . CHOLECYSTECTOMY    . COLONOSCOPY  2007   Dr. Laural Golden: small internal hemorrhoids, otherwise normal. exam could be compromised due to quality of prep  . COLONOSCOPY N/A 02/15/2018   Dr. Oneida Alar: 2 simple adenomas, external and internal hemorrhoids. Next colonoscopy 2024-2026  . ESOPHAGOGASTRODUODENOSCOPY N/A 05/09/2018   Procedure: ESOPHAGOGASTRODUODENOSCOPY (EGD);  Surgeon: Danie Binder, MD; web in the distal esophagus s/p dilated, mild gastritis biopsied, normal examined duodenum. Pathology with gastritis, negative for H. Pylori.    . ESOPHAGOGASTRODUODENOSCOPY (EGD) WITH PROPOFOL N/A 10/05/2020   Procedure: ESOPHAGOGASTRODUODENOSCOPY (EGD) WITH PROPOFOL;  Surgeon: [name], DO;  [facility];  Service: Endoscopy;  Laterality: N/A;  9:00am  . FEMORAL-FEMORAL BYPASS GRAFT Bilateral 10/21/2018   Procedure: RIGHT TO LEFT FEMORAL ARTERY BYPASS GRAFT;  Surgeon: Elam Dutch, MD;  Location: [facility];  Service: Vascular;  Laterality: Bilateral;  . LOWER EXTREMITY ANGIOGRAPHY N/A 09/27/2018   Procedure: LOWER EXTREMITY ANGIOGRAPHY;  Surgeon:  Elam Dutch, MD;  Location: [facility];  Service: Cardiovascular;  Laterality: N/A;  . NM MYOVIEW LTD  12/2014   [facility]: Normal Myoview.  Nonischemic.  Marland Kitchen TRANSTHORACIC ECHOCARDIOGRAM  12/2011   EF 55 to 60%.  No  regional wall motion normality.  GRII DD.  No significant valve disease.    Current Outpatient Medications  Medication Sig Dispense Refill  . amLODipine (NORVASC) 5 MG tablet Take 5 mg by mouth daily.    Marland Kitchen atorvastatin (LIPITOR) 20 MG tablet Take 1 tablet (20 mg total) by mouth daily at 6 PM. 30 tablet 1  . LINZESS 290 MCG CAPS capsule TAKE 1 CAPSULE(290 MCG) BY MOUTH DAILY BEFORE BREAKFAST 90 capsule 3  . oxybutynin (DITROPAN-XL) 5 MG 24 hr tablet Take 5 mg by mouth daily.    . pantoprazole (PROTONIX) 40 MG tablet Take 1 tablet (40 mg total) by mouth 2 (two) times daily. 60 tablet 5   No current facility-administered medications for this visit.    Allergies as of 11/29/2020  . (No Known Allergies)    Family History  Problem Relation Age of Onset  . Hypertension Mother   . Peripheral Artery Disease Brother   . Colon cancer Neg Hx   . Colon polyps Neg Hx     Social History   Socioeconomic History  . Marital status: Widowed    Spouse name: Not on file  . Number of children: Not on file  . Years of education: Not on file  . Highest education level: Not on file  Occupational History  . Occupation: unemployed    Comment: Draws pension from her husband's life insurance policy  Tobacco Use  . Smoking status: Current Every Day Smoker    Packs/day: 0.50    Years: 40.00    Pack years: 20.00    Types: Cigarettes  . Smokeless tobacco: Never Used  Vaping Use  . Vaping Use: Never used  Substance and Sexual Activity  . Alcohol use: No    Alcohol/week: 0.0 standard drinks  . Drug use: No  . Sexual activity: Not Currently  Other Topics Concern  . Not on file  Social History Narrative   Widowed: GETS HUSBAND'S PENSION.  mother 31, grandmother of 64 with one great grandchild.  She currently lives alone.  She completed ninth grade.  Smokes a pack a day for 42 years.  Tries to walks for about 5 minutes at x7 days a week with plans to help revascularization.       HOBBIES: PUZZLE  BOOKS.   Social Determinants of Health   Financial Resource Strain: Not on file  Food Insecurity: Not on file  Transportation Needs: Not on file  Physical Activity: Not on file  Stress: Not on file  Social Connections: Not on file    Review of Systems: Gen: Denies fever, chills, anorexia. Denies fatigue, weakness, weight loss.  CV: Denies chest pain, palpitations, syncope, peripheral edema, and claudication. Resp: Denies dyspnea at rest, cough, wheezing, coughing up blood, and pleurisy. GI: Denies vomiting blood, jaundice, and fecal incontinence.   Denies dysphagia or odynophagia. Derm: Denies rash, itching, dry skin Psych: Denies depression, anxiety, memory loss, confusion. No homicidal or suicidal ideation.  Heme: Denies bruising, bleeding, and enlarged lymph nodes.  Physical Exam: There were no vitals taken for this visit. General:   Alert and oriented. No distress noted. Pleasant and cooperative.  Head:  Normocephalic and atraumatic. Eyes:  Conjuctiva clear without scleral icterus. Mouth:  Oral mucosa pink and moist. Good dentition. No lesions. Heart:  S1, S2 present without murmurs appreciated. Lungs:  Clear to auscultation bilaterally. No wheezes, rales, or rhonchi. No distress.  Abdomen:  +BS, soft, non-tender and non-distended. No rebound or guarding. No HSM or masses noted. Msk:  Symmetrical without gross deformities. Normal posture. Extremities:  Without edema. Neurologic:  Alert and  oriented x4 Psych:  Alert and cooperative. Normal mood and affect.

## 2020-11-29 ENCOUNTER — Encounter: Payer: Self-pay | Admitting: Internal Medicine

## 2020-11-29 ENCOUNTER — Ambulatory Visit: Payer: Medicare HMO | Admitting: Gastroenterology

## 2020-12-14 DIAGNOSIS — J449 Chronic obstructive pulmonary disease, unspecified: Secondary | ICD-10-CM | POA: Diagnosis not present

## 2020-12-14 DIAGNOSIS — Z0001 Encounter for general adult medical examination with abnormal findings: Secondary | ICD-10-CM | POA: Diagnosis not present

## 2020-12-14 DIAGNOSIS — F17210 Nicotine dependence, cigarettes, uncomplicated: Secondary | ICD-10-CM | POA: Diagnosis not present

## 2020-12-14 DIAGNOSIS — K219 Gastro-esophageal reflux disease without esophagitis: Secondary | ICD-10-CM | POA: Diagnosis not present

## 2020-12-14 DIAGNOSIS — I10 Essential (primary) hypertension: Secondary | ICD-10-CM | POA: Diagnosis not present

## 2020-12-14 DIAGNOSIS — Z1389 Encounter for screening for other disorder: Secondary | ICD-10-CM | POA: Diagnosis not present

## 2020-12-14 DIAGNOSIS — Z1331 Encounter for screening for depression: Secondary | ICD-10-CM | POA: Diagnosis not present

## 2020-12-16 ENCOUNTER — Ambulatory Visit: Payer: Self-pay | Admitting: *Deleted

## 2020-12-16 NOTE — Telephone Encounter (Signed)
Patient called, left VM to return the call if there are any additional questions.

## 2020-12-16 NOTE — Telephone Encounter (Signed)
Message from Stephannie Li sent at 12/16/2020 4:45 PM EST  Patient called and said she has a lot of coughing at night ,and she also has a funny taste in her mouth. Her PCP advised her to get a covid test and she is scheduled [phone].She also has other questions.    Returned call to patient. Patient stated that she had been coughing at night and has a funny taste in her mouth. Patient advised by PCP to schedule COVID testing. Explained to patient that getting a COVID test would be a way for the provider to have guidance in a treatment plan for current symptoms. Patient has appt scheduled for 12/22/20. Patient encouraged to try other locations to see if they have earlier availability but she states she has already done so. Patient encouraged to contact PCP if symptoms become worse before scheduled appt. Understanding verbalized. No other questions voiced at this time.   Reason for Disposition . [1] COVID-19 infection suspected by caller or triager AND [2] mild symptoms (cough, fever, or others) AND [3] has not gotten tested yet  Protocols used: CORONAVIRUS (COVID-19) DIAGNOSED OR SUSPECTED-A-AH

## 2020-12-21 ENCOUNTER — Other Ambulatory Visit: Payer: Medicare HMO

## 2020-12-21 ENCOUNTER — Other Ambulatory Visit: Payer: Self-pay

## 2020-12-21 DIAGNOSIS — Z20822 Contact with and (suspected) exposure to covid-19: Secondary | ICD-10-CM | POA: Diagnosis not present

## 2020-12-23 DIAGNOSIS — J449 Chronic obstructive pulmonary disease, unspecified: Secondary | ICD-10-CM | POA: Diagnosis not present

## 2020-12-23 LAB — NOVEL CORONAVIRUS, NAA: SARS-CoV-2, NAA: NOT DETECTED

## 2020-12-23 LAB — SARS-COV-2, NAA 2 DAY TAT

## 2021-01-14 DIAGNOSIS — I10 Essential (primary) hypertension: Secondary | ICD-10-CM | POA: Diagnosis not present

## 2021-01-14 DIAGNOSIS — K219 Gastro-esophageal reflux disease without esophagitis: Secondary | ICD-10-CM | POA: Diagnosis not present

## 2021-02-14 DIAGNOSIS — I10 Essential (primary) hypertension: Secondary | ICD-10-CM | POA: Diagnosis not present

## 2021-02-14 DIAGNOSIS — E559 Vitamin D deficiency, unspecified: Secondary | ICD-10-CM | POA: Diagnosis not present

## 2021-02-14 DIAGNOSIS — E785 Hyperlipidemia, unspecified: Secondary | ICD-10-CM | POA: Diagnosis not present

## 2021-02-14 DIAGNOSIS — K219 Gastro-esophageal reflux disease without esophagitis: Secondary | ICD-10-CM | POA: Diagnosis not present

## 2021-02-16 ENCOUNTER — Encounter (HOSPITAL_COMMUNITY): Payer: Self-pay

## 2021-02-16 ENCOUNTER — Emergency Department (HOSPITAL_COMMUNITY): Payer: Medicare HMO

## 2021-02-16 ENCOUNTER — Emergency Department (HOSPITAL_COMMUNITY)
Admission: EM | Admit: 2021-02-16 | Discharge: 2021-02-17 | Disposition: A | Discharge status: Home / Self Care | Payer: Medicare HMO | Attending: Emergency Medicine | Admitting: Emergency Medicine

## 2021-02-16 ENCOUNTER — Other Ambulatory Visit: Payer: Self-pay

## 2021-02-16 DIAGNOSIS — F17210 Nicotine dependence, cigarettes, uncomplicated: Secondary | ICD-10-CM | POA: Insufficient documentation

## 2021-02-16 DIAGNOSIS — R109 Unspecified abdominal pain: Secondary | ICD-10-CM | POA: Diagnosis not present

## 2021-02-16 DIAGNOSIS — R0789 Other chest pain: Secondary | ICD-10-CM | POA: Diagnosis not present

## 2021-02-16 DIAGNOSIS — R0602 Shortness of breath: Secondary | ICD-10-CM | POA: Diagnosis not present

## 2021-02-16 DIAGNOSIS — J449 Chronic obstructive pulmonary disease, unspecified: Secondary | ICD-10-CM | POA: Insufficient documentation

## 2021-02-16 DIAGNOSIS — K219 Gastro-esophageal reflux disease without esophagitis: Secondary | ICD-10-CM

## 2021-02-16 DIAGNOSIS — I517 Cardiomegaly: Secondary | ICD-10-CM | POA: Diagnosis not present

## 2021-02-16 DIAGNOSIS — Z79899 Other long term (current) drug therapy: Secondary | ICD-10-CM | POA: Diagnosis not present

## 2021-02-16 DIAGNOSIS — R69 Illness, unspecified: Secondary | ICD-10-CM | POA: Diagnosis not present

## 2021-02-16 DIAGNOSIS — M545 Low back pain, unspecified: Secondary | ICD-10-CM | POA: Diagnosis not present

## 2021-02-16 DIAGNOSIS — I10 Essential (primary) hypertension: Secondary | ICD-10-CM | POA: Insufficient documentation

## 2021-02-16 DIAGNOSIS — R079 Chest pain, unspecified: Secondary | ICD-10-CM | POA: Diagnosis not present

## 2021-02-16 DIAGNOSIS — Z9049 Acquired absence of other specified parts of digestive tract: Secondary | ICD-10-CM | POA: Diagnosis not present

## 2021-02-16 DIAGNOSIS — R7989 Other specified abnormal findings of blood chemistry: Secondary | ICD-10-CM | POA: Diagnosis not present

## 2021-02-16 LAB — BASIC METABOLIC PANEL
Anion gap: 7 (ref 5–15)
BUN: 12 mg/dL (ref 8–23)
CO2: 25 mmol/L (ref 22–32)
Calcium: 8.5 mg/dL — ABNORMAL LOW (ref 8.9–10.3)
Chloride: 109 mmol/L (ref 98–111)
Creatinine, Ser: 0.9 mg/dL (ref 0.44–1.00)
GFR, Estimated: >60 mL/min (ref >60)
Glucose, Bld: 102 mg/dL — ABNORMAL HIGH (ref 70–99)
Potassium: 3.7 mmol/L (ref 3.5–5.1)
Sodium: 141 mmol/L (ref 135–145)

## 2021-02-16 LAB — CBC
HCT: 43.5 % (ref 36.0–46.0)
Hemoglobin: 13.4 g/dL (ref 12.0–15.0)
MCH: 27.4 pg (ref 26.0–34.0)
MCHC: 30.8 g/dL (ref 30.0–36.0)
MCV: 89 fL (ref 80.0–100.0)
Platelets: 210 10*3/uL (ref 150–400)
RBC: 4.89 MIL/uL (ref 3.87–5.11)
RDW: 14.9 % (ref 11.5–15.5)
WBC: 5.3 10*3/uL (ref 4.0–10.5)
nRBC: 0 % (ref 0.0–0.2)

## 2021-02-16 LAB — HEPATIC FUNCTION PANEL
ALT: 14 U/L (ref 0–44)
AST: 14 U/L — ABNORMAL LOW (ref 15–41)
Albumin: 3.7 g/dL (ref 3.5–5.0)
Alkaline Phosphatase: 88 U/L (ref 38–126)
Bilirubin, Direct: 0.1 mg/dL (ref 0.0–0.2)
Indirect Bilirubin: 0.3 mg/dL (ref 0.3–0.9)
Total Bilirubin: 0.4 mg/dL (ref 0.3–1.2)
Total Protein: 6.6 g/dL (ref 6.5–8.1)

## 2021-02-16 LAB — TROPONIN I (HIGH SENSITIVITY)
Troponin I (High Sensitivity): 3 ng/L (ref <18)
Troponin I (High Sensitivity): 3 ng/L (ref <18)

## 2021-02-16 LAB — LIPASE, BLOOD: Lipase: 47 U/L (ref 11–51)

## 2021-02-16 MED ORDER — ASPIRIN 81 MG PO CHEW
324.0000 mg | CHEWABLE_TABLET | Freq: Once | ORAL | Status: AC
  Administered 2021-02-16: 324 mg via ORAL
  Filled 2021-02-16: qty 4

## 2021-02-16 MED ORDER — LIDOCAINE VISCOUS HCL 2 % MT SOLN
15.0000 mL | Freq: Once | OROMUCOSAL | Status: AC
  Administered 2021-02-16: 15 mL via ORAL
  Filled 2021-02-16: qty 15

## 2021-02-16 MED ORDER — ALUM & MAG HYDROXIDE-SIMETH 200-200-20 MG/5ML PO SUSP
30.0000 mL | Freq: Once | ORAL | Status: AC
  Administered 2021-02-16: 30 mL via ORAL
  Filled 2021-02-16: qty 30

## 2021-02-16 NOTE — ED Triage Notes (Signed)
Pt to er, pt states that last night when she was sitting down she started to feel like her whole body was burning.  Pt denies itchiness or pain, just states that it feels like it is burning all over. Pt states that she also has some chest pain that comes and goes.

## 2021-02-16 NOTE — ED Provider Notes (Signed)
[facility] EMERGENCY DEPARTMENT Provider Note   CSN: [id number] Arrival date & time: 02/16/21  2015     History Chief Complaint  Patient presents with   Chest Pain    Virginia Washington is a 65 y.o. female.  Patient with history of hypertension, kidney stones, COPD, DVT not on anticoagulation presenting with burning to her left lower back for the past 24 hours.  She reports "burning in my skin" that has been constant and feels like something is burning inside her.  She has not noticed any rash.  There has been no fever, chills, nausea or vomiting.  No pain with urination or blood in the urine.  She is also noticed some intermittent central chest pain that started about the same time.  Chest pain is intermittent lasting for about 5 minutes at a time and radiates to her neck.  She does not have any pain currently.  She has never had this kind of chest pain before.  Denies any cardiac history. Denies any abdominal pain.  Denies any cough or fever. Chest pain has been intermittent since yesterday and started about the same time as the back pain.  The history is provided by the patient.  Chest Pain Associated symptoms: back pain   Associated symptoms: no abdominal pain, no dizziness, no fatigue, no fever, no headache, no nausea, no shortness of breath, no vomiting and no weakness        Past Medical History:  Diagnosis Date   Chronic back pain    Constipation    Constipation    COPD (chronic obstructive pulmonary disease) (HCC)    DVT (deep venous thrombosis) (HCC)    GERD (gastroesophageal reflux disease)    Headache    History of kidney stones    Hyperlipidemia    Hypertension     Patient Active Problem List   Diagnosis Date Noted   Dyspepsia 08/11/2020   Early satiety 08/11/2020   Intractable abdominal pain 05/30/2019   Partial bowel obstruction ([place]) 05/30/2019   Preop cardiovascular exam 09/23/2018   PAD (peripheral artery disease) ([place]) -left iliac  occlusion 09/23/2018   Dysphagia    SBO (small bowel obstruction) ([place]) 04/29/2018   GERD (gastroesophageal reflux disease) 04/28/2018   Hypokalemia 04/28/2018   Pulmonary nodule 04/28/2018   Hypernatremia 04/28/2018   Enteritis 04/28/2018   Encounter for screening colonoscopy 01/16/2018   Abdominal pain 11/16/2017   Constipation 11/16/2017   Partial small bowel obstruction ([place]) 08/16/2016   Hyperlipidemia 08/16/2016   Chest pain 12/12/2014   Hypertension 12/12/2014   Tobacco abuse 10/29/2011    Past Surgical History:  Procedure Laterality Date   ABDOMINAL AORTOGRAM W/LOWER EXTREMITY N/A 10/26/2017   Procedure: ABDOMINAL AORTOGRAM W/LOWER EXTREMITY;  Surgeon: Elam Dutch, MD;  Location: [place] [facility];  Service: Cardiovascular: 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  Normal right aortoiliac vessels.  Three-vessel runoff bilaterally.  (Sluggish flow of the left side due to occlusion.   ABDOMINAL HYSTERECTOMY     BALLOON DILATION N/A 10/05/2020   Procedure: BALLOON DILATION;  Surgeon: [name], DO;  [facility];  Service: Endoscopy;  Laterality: N/A;   BIOPSY  10/05/2020   Procedure: BIOPSY;  Surgeon: [name], DO;  [facility];  Service: Endoscopy;;   CHOLECYSTECTOMY     COLONOSCOPY  2007   Dr. Laural Golden: small internal hemorrhoids, otherwise normal. exam could be compromised due to quality of prep  COLONOSCOPY N/A 02/15/2018   Dr. Oneida Alar: 2 simple adenomas, external and internal hemorrhoids. Next colonoscopy 2024-2026   ESOPHAGOGASTRODUODENOSCOPY N/A 05/09/2018   Procedure: ESOPHAGOGASTRODUODENOSCOPY (EGD);  Surgeon: Danie Binder, MD; web in the distal esophagus s/p dilated, mild gastritis biopsied, normal examined duodenum. Pathology with gastritis, negative for H. Pylori.     ESOPHAGOGASTRODUODENOSCOPY (EGD) WITH PROPOFOL N/A 10/05/2020    Procedure: ESOPHAGOGASTRODUODENOSCOPY (EGD) WITH PROPOFOL;  Surgeon: [name], DO;  [facility];  Service: Endoscopy;  Laterality: N/A;  9:00am   FEMORAL-FEMORAL BYPASS GRAFT Bilateral 10/21/2018   Procedure: RIGHT TO LEFT FEMORAL ARTERY BYPASS GRAFT;  Surgeon: Elam Dutch, MD;  Location: [facility];  Service: Vascular;  Laterality: Bilateral;   LOWER EXTREMITY ANGIOGRAPHY N/A 09/27/2018   Procedure: LOWER EXTREMITY ANGIOGRAPHY;  Surgeon: Elam Dutch, MD;  Location: [place] [facility];  Service: Cardiovascular;  Laterality: N/A;   NM MYOVIEW LTD  12/2014   [facility]: Normal Myoview.  Nonischemic.   TRANSTHORACIC ECHOCARDIOGRAM  12/2011   EF 55 to 60%.  No regional wall motion normality.  GRII DD.  No significant valve disease.     OB History    Gravida      Para      Term      Preterm      AB      Living  3     SAB      IAB      Ectopic      Multiple      Live Births              Family History  Problem Relation Age of Onset   Hypertension Mother    Peripheral Artery Disease Brother    Colon cancer Neg Hx    Colon polyps Neg Hx     Social History   Tobacco Use   Smoking status: Current Every Day Smoker    Packs/day: 0.50    Years: 40.00    Pack years: 20.00    Types: Cigarettes   Smokeless tobacco: Never Used  Vaping Use   Vaping Use: Never used  Substance Use Topics   Alcohol use: No    Alcohol/week: 0.0 standard drinks   Drug use: No    Home Medications Prior to Admission medications   Medication Sig Start Date End Date Taking? Authorizing Provider  amLODipine (NORVASC) 5 MG tablet Take 5 mg by mouth daily.    [provider]  atorvastatin (LIPITOR) 20 MG tablet Take 1 tablet (20 mg total) by mouth daily at 6 PM. 06/02/19   Barton Dubois, MD  LINZESS 290 MCG CAPS capsule TAKE 1 CAPSULE(290 MCG) BY MOUTH DAILY BEFORE BREAKFAST 10/12/20   Mahala Menghini, PA-C  oxybutynin (DITROPAN-XL) 5  MG 24 hr tablet Take 5 mg by mouth daily. 07/15/20   [provider]  pantoprazole (PROTONIX) 40 MG tablet Take 1 tablet (40 mg total) by mouth 2 (two) times daily. 10/05/20 04/03/21  [name], DO    Allergies    Patient has no known allergies.  Review of Systems   Review of Systems  Constitutional: Negative for activity change, appetite change, fatigue and fever.  Eyes: Negative for visual disturbance.  Respiratory: Positive for chest tightness. Negative for shortness of breath.   Cardiovascular: Positive for chest pain.  Gastrointestinal: Negative for abdominal pain, nausea and vomiting.  Genitourinary: Negative for dysuria, hematuria and pelvic pain.  Musculoskeletal: Positive for back pain.  Skin: Negative for rash.  Neurological: Negative for dizziness, weakness and headaches.   all other systems are negative except as noted in the HPI and PMH.   Physical Exam Updated Vital Signs BP 139/78    Pulse 65    Temp 99.6 F (37.6 C) (Oral)    Resp 17    Ht 5\' 2"  (1.575 m)    Wt 60.8 kg    SpO2 99%    BMI 24.51 kg/m   Physical Exam Vitals and nursing note reviewed.  Constitutional:      General: She is not in acute distress.    Appearance: Normal appearance. She is well-developed, normal weight and well-nourished.  HENT:     Head: Normocephalic and atraumatic.     Mouth/Throat:     Mouth: Oropharynx is clear and moist.     Pharynx: No oropharyngeal exudate.  Eyes:     Extraocular Movements: EOM normal.     Conjunctiva/sclera: Conjunctivae normal.     Pupils: Pupils are equal, round, and reactive to light.  Neck:     Comments: No meningismus. Cardiovascular:     Rate and Rhythm: Normal rate and regular rhythm.     Pulses: Intact distal pulses.     Heart sounds: Normal heart sounds. No murmur heard.   Pulmonary:     Effort: Pulmonary effort is normal. No respiratory distress.     Breath sounds: Normal breath sounds.  Chest:     Chest wall: No tenderness.   Abdominal:     Palpations: Abdomen is soft.     Tenderness: There is no abdominal tenderness. There is no guarding or rebound.  Musculoskeletal:        General: Tenderness present. No edema. Normal range of motion.     Cervical back: Normal range of motion and neck supple.     Comments: Left paraspinal lumbar tenderness.  No rash.  Intact DP and PT pulses.  No midline tenderness  Skin:    General: Skin is warm.  Neurological:     Mental Status: She is alert and oriented to person, place, and time.     Cranial Nerves: No cranial nerve deficit.     Motor: No abnormal muscle tone.     Coordination: Coordination normal.     Comments: No ataxia on finger to nose bilaterally. No pronator drift. 5/5 strength throughout. CN 2-12 intact.Equal grip strength. Sensation intact.   Psychiatric:        Mood and Affect: Mood and affect normal.        Behavior: Behavior normal.     ED Results / Procedures / Treatments   Labs (all labs ordered are listed, but only abnormal results are displayed) Labs Reviewed  BASIC METABOLIC PANEL - Abnormal; Notable for the following components:      Result Value   Glucose, Bld 102 (*)    Calcium 8.5 (*)    All other components within normal limits  HEPATIC FUNCTION PANEL - Abnormal; Notable for the following components:   AST 14 (*)    All other components within normal limits  URINALYSIS, ROUTINE W REFLEX MICROSCOPIC - Abnormal; Notable for the following components:   Ketones, ur 5 (*)    All other components within normal limits  D-DIMER, QUANTITATIVE (NOT AT [facility]) - Abnormal; Notable for the following components:   D-Dimer, Quant 1.18 (*)    All other components within normal limits  CBC  LIPASE, BLOOD  TROPONIN I (HIGH SENSITIVITY)  TROPONIN I (HIGH  SENSITIVITY)  TROPONIN I (HIGH SENSITIVITY)  TROPONIN I (HIGH SENSITIVITY)    EKG EKG Interpretation  Date/Time:  Wednesday February 16 2021 20:32:11 EST Ventricular Rate:  82 PR  Interval:  164 QRS Duration: 66 QT Interval:  380 QTC Calculation: 443 R Axis:   67 Text Interpretation: Normal sinus rhythm Normal ECG No significant change was found No significant change was found Reconfirmed by Ezequiel Essex [phone]) on 02/17/2021 4:00:54 AM   Radiology DG Chest Port 1 View  Result Date: 02/16/2021 CLINICAL DATA:  Short of breath, chest pain EXAM: PORTABLE CHEST 1 VIEW COMPARISON:  02/08/2019 FINDINGS: Single frontal view of the chest demonstrates a stable cardiac silhouette. Stable background scarring without airspace disease, effusion, or pneumothorax. No acute bony abnormalities. IMPRESSION: 1. Stable chest, no acute process. Electronically Signed   By: Randa Ngo M.D.   On: 02/16/2021 21:38    Procedures Procedures   Medications Ordered in ED Medications  aspirin chewable tablet 324 mg (has no administration in time range)  alum & mag hydroxide-simeth (MAALOX/MYLANTA) 200-200-20 MG/5ML suspension 30 mL (has no administration in time range)    And  lidocaine (XYLOCAINE) 2 % viscous mouth solution 15 mL (has no administration in time range)    ED Course  I have reviewed the triage vital signs and the nursing notes.  Pertinent labs & imaging results that were available during my care of the patient were reviewed by me and considered in my medical decision making (see chart for details).    MDM Rules/Calculators/A&P                         Burning to her low back for the past 1 day with intermittent chest pain as well.  Chest pain last for several minutes at a time and resolved  EKG is normal sinus rhythm without acute ST changes.  Shingles is considered given the burning pain in her low back but no rashes visible.  Troponin is negative x2.  Low suspicion for ACS.  LFTs and lipase are normal. UA negative.  Troponins remain negative.  Negative stress test in 2019.  EKG is sinus rhythm. EGD in October 2021 showed esophagitis and gastritis. Patient states  she is no longer taking her PPI  Patient's flank pain persists now she is having some epigastric pain as well.  Will obtain CT imaging to rule out vascular pathology as well as pulmonary embolism  No pulmonary embolism, aortic dissection or aortic aneurysm.  Troponins remain negative.  No evidence of MI.  Patient no further episodes of chest pain and is feeling improved.  Advised to monitor this area of burning on her back to see if she develops a rash consistent with zoster.  Follow-up with your PCP.  Return to the ED for chest pain becomes exertional, associated shortness of breath, nausea, vomiting, sweating, any other concerns. Final Clinical Impression(s) / ED Diagnoses Final diagnoses:  Atypical chest pain  Flank pain    Rx / DC Orders ED Discharge Orders    None       Kaniel Kiang, Annie Main, MD 02/17/21 [phone]

## 2021-02-17 ENCOUNTER — Emergency Department (HOSPITAL_COMMUNITY): Payer: Medicare HMO

## 2021-02-17 DIAGNOSIS — Z9049 Acquired absence of other specified parts of digestive tract: Secondary | ICD-10-CM | POA: Diagnosis not present

## 2021-02-17 DIAGNOSIS — R0789 Other chest pain: Secondary | ICD-10-CM | POA: Diagnosis not present

## 2021-02-17 DIAGNOSIS — R7989 Other specified abnormal findings of blood chemistry: Secondary | ICD-10-CM | POA: Diagnosis not present

## 2021-02-17 DIAGNOSIS — R109 Unspecified abdominal pain: Secondary | ICD-10-CM | POA: Diagnosis not present

## 2021-02-17 DIAGNOSIS — R079 Chest pain, unspecified: Secondary | ICD-10-CM | POA: Diagnosis not present

## 2021-02-17 DIAGNOSIS — I517 Cardiomegaly: Secondary | ICD-10-CM | POA: Diagnosis not present

## 2021-02-17 LAB — D-DIMER, QUANTITATIVE: D-Dimer, Quant: 1.18 ug/mL-FEU — ABNORMAL HIGH (ref 0.00–0.50)

## 2021-02-17 LAB — TROPONIN I (HIGH SENSITIVITY): Troponin I (High Sensitivity): 3 ng/L (ref <18)

## 2021-02-17 LAB — URINALYSIS, ROUTINE W REFLEX MICROSCOPIC
Bilirubin Urine: NEGATIVE
Glucose, UA: NEGATIVE mg/dL
Hgb urine dipstick: NEGATIVE
Ketones, ur: 5 mg/dL — AB
Leukocytes,Ua: NEGATIVE
Nitrite: NEGATIVE
Protein, ur: NEGATIVE mg/dL
Specific Gravity, Urine: 1.025 (ref 1.005–1.030)
pH: 6 (ref 5.0–8.0)

## 2021-02-17 MED ORDER — IOHEXOL 350 MG/ML SOLN
100.0000 mL | Freq: Once | INTRAVENOUS | Status: AC | PRN
  Administered 2021-02-17: 100 mL via INTRAVENOUS

## 2021-02-17 MED ORDER — PANTOPRAZOLE SODIUM 40 MG PO TBEC: 40.0000 mg | DELAYED_RELEASE_TABLET | Freq: Two times a day (BID) | ORAL | 0 refills | Status: DC

## 2021-02-17 NOTE — Discharge Instructions (Signed)
There is no evidence of heart attack or blood clot in the lung.  Watch the area of burning on your low back to see if you develop a rash consistent with shingles.  Take the medication as prescribed.  Avoid alcohol, caffeine, NSAID medications such as ibuprofen, naproxen or aspirin. Follow-up with your doctor.  Return to the ED if you have exertional chest pain or chest pain associated with shortness of breath, nausea, vomiting, sweating, any other concerns.

## 2021-02-17 NOTE — ED Notes (Signed)
Pt to CT

## 2021-03-05 ENCOUNTER — Other Ambulatory Visit: Payer: Self-pay

## 2021-03-05 ENCOUNTER — Emergency Department (HOSPITAL_COMMUNITY)
Admission: EM | Admit: 2021-03-05 | Discharge: 2021-03-05 | Disposition: A | Discharge status: Home / Self Care | Payer: Medicare HMO | Attending: Emergency Medicine | Admitting: Emergency Medicine

## 2021-03-05 ENCOUNTER — Encounter (HOSPITAL_COMMUNITY): Payer: Self-pay

## 2021-03-05 ENCOUNTER — Emergency Department (HOSPITAL_COMMUNITY): Payer: Medicare HMO

## 2021-03-05 DIAGNOSIS — Y92410 Unspecified street and highway as the place of occurrence of the external cause: Secondary | ICD-10-CM | POA: Diagnosis not present

## 2021-03-05 DIAGNOSIS — I10 Essential (primary) hypertension: Secondary | ICD-10-CM | POA: Insufficient documentation

## 2021-03-05 DIAGNOSIS — J449 Chronic obstructive pulmonary disease, unspecified: Secondary | ICD-10-CM | POA: Insufficient documentation

## 2021-03-05 DIAGNOSIS — S40022A Contusion of left upper arm, initial encounter: Secondary | ICD-10-CM | POA: Diagnosis not present

## 2021-03-05 DIAGNOSIS — F17210 Nicotine dependence, cigarettes, uncomplicated: Secondary | ICD-10-CM | POA: Insufficient documentation

## 2021-03-05 DIAGNOSIS — S20212A Contusion of left front wall of thorax, initial encounter: Secondary | ICD-10-CM | POA: Insufficient documentation

## 2021-03-05 DIAGNOSIS — M79602 Pain in left arm: Secondary | ICD-10-CM | POA: Diagnosis not present

## 2021-03-05 DIAGNOSIS — S299XXA Unspecified injury of thorax, initial encounter: Secondary | ICD-10-CM | POA: Diagnosis not present

## 2021-03-05 DIAGNOSIS — R69 Illness, unspecified: Secondary | ICD-10-CM | POA: Diagnosis not present

## 2021-03-05 DIAGNOSIS — R0789 Other chest pain: Secondary | ICD-10-CM | POA: Diagnosis not present

## 2021-03-05 DIAGNOSIS — R079 Chest pain, unspecified: Secondary | ICD-10-CM | POA: Diagnosis not present
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
## 2021-03-05 NOTE — Discharge Instructions (Addendum)
Take Tylenol or Motrin for pain and follow-up with your doctor if any problem 
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
## 2021-03-14 DIAGNOSIS — I10 Essential (primary) hypertension: Secondary | ICD-10-CM | POA: Diagnosis not present

## 2021-03-14 DIAGNOSIS — R69 Illness, unspecified: Secondary | ICD-10-CM | POA: Diagnosis not present

## 2021-04-14 DIAGNOSIS — I10 Essential (primary) hypertension: Secondary | ICD-10-CM | POA: Diagnosis not present

## 2021-04-14 DIAGNOSIS — J449 Chronic obstructive pulmonary disease, unspecified: Secondary | ICD-10-CM | POA: Diagnosis not present

## 2021-05-14 DIAGNOSIS — J449 Chronic obstructive pulmonary disease, unspecified: Secondary | ICD-10-CM | POA: Diagnosis not present

## 2021-05-14 DIAGNOSIS — I10 Essential (primary) hypertension: Secondary | ICD-10-CM | POA: Diagnosis not present

## 2021-06-14 ENCOUNTER — Other Ambulatory Visit: Payer: Self-pay | Admitting: *Deleted

## 2021-06-14 DIAGNOSIS — I10 Essential (primary) hypertension: Secondary | ICD-10-CM | POA: Diagnosis not present

## 2021-06-14 DIAGNOSIS — R69 Illness, unspecified: Secondary | ICD-10-CM | POA: Diagnosis not present

## 2021-06-14 NOTE — Patient Outreach (Signed)
[facility]) Care Management  06/14/2021  Virginia Washington 30-Nov-1956 [id number]   RN Health Coach telephone call to patient.  [employer] compliance verified. Per patient she is doing good. RN described the services that [facility] offered. Rn discussed education to help patients understand their disease process. Pharmacy helping to better understand your medicines or assist with resources if unable to afford. RN discussed the Social worker helping with community services. Patient has declined the need for the services.  Plan: RN sent brochure on [facility] services RN sent educational material on hypertension RN made the patient aware that she will follow up outreach in the month of  September   Daveda Larock [place] [facility] [phone]

## 2021-07-07 ENCOUNTER — Other Ambulatory Visit: Payer: Self-pay

## 2021-07-07 ENCOUNTER — Other Ambulatory Visit (HOSPITAL_COMMUNITY)
Admission: RE | Admit: 2021-07-07 | Discharge: 2021-07-07 | Disposition: A | Discharge status: Home / Self Care | Payer: Medicare HMO | Source: Ambulatory Visit | Attending: Internal Medicine | Admitting: Internal Medicine

## 2021-07-07 DIAGNOSIS — Z0001 Encounter for general adult medical examination with abnormal findings: Secondary | ICD-10-CM | POA: Insufficient documentation

## 2021-07-07 DIAGNOSIS — I10 Essential (primary) hypertension: Secondary | ICD-10-CM | POA: Insufficient documentation

## 2021-07-07 DIAGNOSIS — Z79899 Other long term (current) drug therapy: Secondary | ICD-10-CM | POA: Insufficient documentation

## 2021-07-07 DIAGNOSIS — K219 Gastro-esophageal reflux disease without esophagitis: Secondary | ICD-10-CM | POA: Diagnosis not present

## 2021-07-07 DIAGNOSIS — R69 Illness, unspecified: Secondary | ICD-10-CM | POA: Diagnosis not present

## 2021-07-07 DIAGNOSIS — J449 Chronic obstructive pulmonary disease, unspecified: Secondary | ICD-10-CM | POA: Diagnosis not present

## 2021-07-07 LAB — CBC WITH DIFFERENTIAL/PLATELET
Abs Immature Granulocytes: 0.01 10*3/uL (ref 0.00–0.07)
Basophils Absolute: 0 10*3/uL (ref 0.0–0.1)
Basophils Relative: 1 %
Eosinophils Absolute: 0.1 10*3/uL (ref 0.0–0.5)
Eosinophils Relative: 1 %
HCT: 44.4 % (ref 36.0–46.0)
Hemoglobin: 14.1 g/dL (ref 12.0–15.0)
Immature Granulocytes: 0 %
Lymphocytes Relative: 44 %
Lymphs Abs: 2.7 10*3/uL (ref 0.7–4.0)
MCH: 28.1 pg (ref 26.0–34.0)
MCHC: 31.8 g/dL (ref 30.0–36.0)
MCV: 88.6 fL (ref 80.0–100.0)
Monocytes Absolute: 0.4 10*3/uL (ref 0.1–1.0)
Monocytes Relative: 7 %
Neutro Abs: 2.8 10*3/uL (ref 1.7–7.7)
Neutrophils Relative %: 47 %
Platelets: 218 10*3/uL (ref 150–400)
RBC: 5.01 MIL/uL (ref 3.87–5.11)
RDW: 13.9 % (ref 11.5–15.5)
WBC: 6.1 10*3/uL (ref 4.0–10.5)
nRBC: 0 % (ref 0.0–0.2)

## 2021-07-07 LAB — BASIC METABOLIC PANEL
Anion gap: 10 (ref 5–15)
BUN: 9 mg/dL (ref 8–23)
CO2: 23 mmol/L (ref 22–32)
Calcium: 8.6 mg/dL — ABNORMAL LOW (ref 8.9–10.3)
Chloride: 105 mmol/L (ref 98–111)
Creatinine, Ser: 0.98 mg/dL (ref 0.44–1.00)
GFR, Estimated: >60 mL/min (ref >60)
Glucose, Bld: 99 mg/dL (ref 70–99)
Potassium: 3.3 mmol/L — ABNORMAL LOW (ref 3.5–5.1)
Sodium: 138 mmol/L (ref 135–145)

## 2021-07-07 LAB — HEPATIC FUNCTION PANEL
ALT: 16 U/L (ref 0–44)
AST: 16 U/L (ref 15–41)
Albumin: 3.8 g/dL (ref 3.5–5.0)
Alkaline Phosphatase: 105 U/L (ref 38–126)
Bilirubin, Direct: 0.1 mg/dL (ref 0.0–0.2)
Indirect Bilirubin: 0.6 mg/dL (ref 0.3–0.9)
Total Bilirubin: 0.7 mg/dL (ref 0.3–1.2)
Total Protein: 6.9 g/dL (ref 6.5–8.1)

## 2021-07-07 LAB — LIPID PANEL
Cholesterol: 198 mg/dL (ref 0–200)
HDL: 39 mg/dL — ABNORMAL LOW (ref >40)
LDL Cholesterol: 131 mg/dL — ABNORMAL HIGH (ref 0–99)
Total CHOL/HDL Ratio: 5.1 RATIO
Triglycerides: 141 mg/dL (ref <150)
VLDL: 28 mg/dL (ref 0–40)

## 2021-08-01 DIAGNOSIS — E785 Hyperlipidemia, unspecified: Secondary | ICD-10-CM | POA: Diagnosis not present

## 2021-08-01 DIAGNOSIS — K219 Gastro-esophageal reflux disease without esophagitis: Secondary | ICD-10-CM | POA: Diagnosis not present

## 2021-08-01 DIAGNOSIS — Z008 Encounter for other general examination: Secondary | ICD-10-CM | POA: Diagnosis not present

## 2021-08-01 DIAGNOSIS — K08409 Partial loss of teeth, unspecified cause, unspecified class: Secondary | ICD-10-CM | POA: Diagnosis not present

## 2021-08-01 DIAGNOSIS — Z596 Low income: Secondary | ICD-10-CM | POA: Diagnosis not present

## 2021-08-01 DIAGNOSIS — I10 Essential (primary) hypertension: Secondary | ICD-10-CM | POA: Diagnosis not present

## 2021-08-01 DIAGNOSIS — K5900 Constipation, unspecified: Secondary | ICD-10-CM | POA: Diagnosis not present

## 2021-08-01 DIAGNOSIS — J449 Chronic obstructive pulmonary disease, unspecified: Secondary | ICD-10-CM | POA: Diagnosis not present

## 2021-08-01 DIAGNOSIS — Z5948 Other specified lack of adequate food: Secondary | ICD-10-CM | POA: Diagnosis not present

## 2021-08-01 DIAGNOSIS — R69 Illness, unspecified: Secondary | ICD-10-CM | POA: Diagnosis not present

## 2021-08-07 DIAGNOSIS — M1990 Unspecified osteoarthritis, unspecified site: Secondary | ICD-10-CM | POA: Diagnosis not present

## 2021-08-07 DIAGNOSIS — I10 Essential (primary) hypertension: Secondary | ICD-10-CM | POA: Diagnosis not present

## 2021-09-07 DIAGNOSIS — M1990 Unspecified osteoarthritis, unspecified site: Secondary | ICD-10-CM | POA: Diagnosis not present

## 2021-09-07 DIAGNOSIS — I10 Essential (primary) hypertension: Secondary | ICD-10-CM | POA: Diagnosis not present

## 2021-09-15 ENCOUNTER — Other Ambulatory Visit: Payer: Self-pay | Admitting: *Deleted

## 2021-09-15 NOTE — Patient Outreach (Signed)
[facility]) Care Management  09/15/2021  LABELLA ZAHRADNIK Sep 29, 1956 [id number]  RN sent educational information on HTN and How to monitor your blood pressure this educational outreach.   Plan: RN will provide educational material every 6 months. RN will follow up within the month of March RN sent How to take you blood pressure educational material RN provided a calendar book   [place] [facility] [phone]

## 2021-10-07 DIAGNOSIS — I10 Essential (primary) hypertension: Secondary | ICD-10-CM | POA: Diagnosis not present

## 2021-10-07 DIAGNOSIS — M1990 Unspecified osteoarthritis, unspecified site: Secondary | ICD-10-CM | POA: Diagnosis not present

## 2021-10-14 DIAGNOSIS — Z23 Encounter for immunization: Secondary | ICD-10-CM | POA: Diagnosis not present

## 2021-11-07 DIAGNOSIS — R69 Illness, unspecified: Secondary | ICD-10-CM | POA: Diagnosis not present

## 2021-11-07 DIAGNOSIS — I10 Essential (primary) hypertension: Secondary | ICD-10-CM | POA: Diagnosis not present

## 2021-12-07 DIAGNOSIS — I10 Essential (primary) hypertension: Secondary | ICD-10-CM | POA: Diagnosis not present

## 2021-12-07 DIAGNOSIS — J449 Chronic obstructive pulmonary disease, unspecified: Secondary | ICD-10-CM | POA: Diagnosis not present

## 2021-12-10 ENCOUNTER — Encounter (HOSPITAL_COMMUNITY): Payer: Self-pay | Admitting: Emergency Medicine

## 2021-12-10 ENCOUNTER — Emergency Department (HOSPITAL_COMMUNITY): Payer: Medicare HMO

## 2021-12-10 ENCOUNTER — Other Ambulatory Visit: Payer: Self-pay

## 2021-12-10 ENCOUNTER — Emergency Department (HOSPITAL_COMMUNITY)
Admission: EM | Admit: 2021-12-10 | Discharge: 2021-12-10 | Disposition: A | Discharge status: Home / Self Care | Payer: Medicare HMO | Attending: Emergency Medicine | Admitting: Emergency Medicine

## 2021-12-10 DIAGNOSIS — R69 Illness, unspecified: Secondary | ICD-10-CM | POA: Diagnosis not present

## 2021-12-10 DIAGNOSIS — Z20822 Contact with and (suspected) exposure to covid-19: Secondary | ICD-10-CM | POA: Diagnosis not present

## 2021-12-10 DIAGNOSIS — Z79899 Other long term (current) drug therapy: Secondary | ICD-10-CM | POA: Diagnosis not present

## 2021-12-10 DIAGNOSIS — K219 Gastro-esophageal reflux disease without esophagitis: Secondary | ICD-10-CM | POA: Insufficient documentation

## 2021-12-10 DIAGNOSIS — R079 Chest pain, unspecified: Secondary | ICD-10-CM | POA: Diagnosis not present

## 2021-12-10 DIAGNOSIS — F17210 Nicotine dependence, cigarettes, uncomplicated: Secondary | ICD-10-CM | POA: Insufficient documentation

## 2021-12-10 DIAGNOSIS — Z7729 Contact with and (suspected ) exposure to other hazardous substances: Secondary | ICD-10-CM

## 2021-12-10 DIAGNOSIS — I10 Essential (primary) hypertension: Secondary | ICD-10-CM | POA: Diagnosis not present

## 2021-12-10 DIAGNOSIS — R42 Dizziness and giddiness: Secondary | ICD-10-CM | POA: Insufficient documentation

## 2021-12-10 DIAGNOSIS — T588X1A Toxic effect of carbon monoxide from other source, accidental (unintentional), initial encounter: Secondary | ICD-10-CM | POA: Insufficient documentation

## 2021-12-10 DIAGNOSIS — J449 Chronic obstructive pulmonary disease, unspecified: Secondary | ICD-10-CM | POA: Insufficient documentation

## 2021-12-10 DIAGNOSIS — R112 Nausea with vomiting, unspecified: Secondary | ICD-10-CM | POA: Insufficient documentation

## 2021-12-10 DIAGNOSIS — T5891XA Toxic effect of carbon monoxide from unspecified source, accidental (unintentional), initial encounter: Secondary | ICD-10-CM | POA: Diagnosis not present

## 2021-12-10 LAB — TROPONIN I (HIGH SENSITIVITY)
Troponin I (High Sensitivity): 2 ng/L (ref <18)
Troponin I (High Sensitivity): 3 ng/L (ref <18)

## 2021-12-10 LAB — RESP PANEL BY RT-PCR (FLU A&B, COVID) ARPGX2
Influenza A by PCR: NEGATIVE
Influenza B by PCR: NEGATIVE
SARS Coronavirus 2 by RT PCR: NEGATIVE

## 2021-12-10 LAB — CARBOXYHEMOGLOBIN - COOX: Carboxyhemoglobin: 9.3 % (ref 0.5–1.5)
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
## 2021-12-10 NOTE — ED Provider Notes (Signed)
[facility] [place] Provider Note   CSN: [id number] Arrival date & time: 12/10/21  1000     History No chief complaint on file.   Virginia Washington is a 65 y.o. female with medical history significant for COPD, DVT, GERD, headache  Patient states this morning at 6am she woke up because she thought she smelled something burning. Patient states the smell was coming from her space heaters. Patient now says she has headache, her mouth "has a funny taste" that she describes as bitter, has chest pain, shortness of breath, headache, dizziness, nauseas and vomiting. Patient is concerned for carbon monoxide exposure. Patient has history of COPD and states that she is still smoking. Patient denies diarrhea, abdominal pain.  HPI     Past Medical History:  Diagnosis Date   Chronic back pain    Constipation    Constipation    COPD (chronic obstructive pulmonary disease) (HCC)    DVT (deep venous thrombosis) (HCC)    GERD (gastroesophageal reflux disease)    Headache    History of kidney stones    Hyperlipidemia    Hypertension     Patient Active Problem List   Diagnosis Date Noted   Dyspepsia 08/11/2020   Early satiety 08/11/2020   Intractable abdominal pain 05/30/2019   Partial bowel obstruction ([place]) 05/30/2019   Preop cardiovascular exam 09/23/2018   PAD (peripheral artery disease) ([place]) -left iliac occlusion 09/23/2018   Dysphagia    SBO (small bowel obstruction) ([place]) 04/29/2018   GERD (gastroesophageal reflux disease) 04/28/2018   Hypokalemia 04/28/2018   Pulmonary nodule 04/28/2018   Hypernatremia 04/28/2018   Enteritis 04/28/2018   Encounter for screening colonoscopy 01/16/2018   Abdominal pain 11/16/2017   Constipation 11/16/2017   Partial small bowel obstruction ([place]) 08/16/2016   Hyperlipidemia 08/16/2016   Chest pain 12/12/2014   Hypertension 12/12/2014   Tobacco abuse 10/29/2011    Past Surgical History:  Procedure Laterality Date   ABDOMINAL  AORTOGRAM W/LOWER EXTREMITY N/A 10/26/2017   Procedure: ABDOMINAL AORTOGRAM W/LOWER EXTREMITY;  Surgeon: Elam Dutch, MD;  Location: [facility];  Service: Cardiovascular: 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  Normal right aortoiliac vessels.  Three-vessel runoff bilaterally.  (Sluggish flow of the left side due to occlusion.   ABDOMINAL HYSTERECTOMY     BALLOON DILATION N/A 10/05/2020   Procedure: BALLOON DILATION;  Surgeon: [name], DO;  [facility];  Service: Endoscopy;  Laterality: N/A;   BIOPSY  10/05/2020   Procedure: BIOPSY;  Surgeon: [name], DO;  [facility];  Service: Endoscopy;;   CHOLECYSTECTOMY     COLONOSCOPY  2007   Dr. Laural Golden: small internal hemorrhoids, otherwise normal. exam could be compromised due to quality of prep   COLONOSCOPY N/A 02/15/2018   Dr. Oneida Alar: 2 simple adenomas, external and internal hemorrhoids. Next colonoscopy 2024-2026   ESOPHAGOGASTRODUODENOSCOPY N/A 05/09/2018   Procedure: ESOPHAGOGASTRODUODENOSCOPY (EGD);  Surgeon: Danie Binder, MD; web in the distal esophagus s/p dilated, mild gastritis biopsied, normal examined duodenum. Pathology with gastritis, negative for H. Pylori.     ESOPHAGOGASTRODUODENOSCOPY (EGD) WITH PROPOFOL N/A 10/05/2020   Procedure: ESOPHAGOGASTRODUODENOSCOPY (EGD) WITH PROPOFOL;  Surgeon: [name], DO;  [facility];  Service: Endoscopy;  Laterality: N/A;  9:00am   FEMORAL-FEMORAL BYPASS GRAFT Bilateral 10/21/2018   Procedure: RIGHT TO LEFT FEMORAL ARTERY BYPASS GRAFT;  Surgeon: Elam Dutch, MD;  Location: [facility];  Service: Vascular;  Laterality: Bilateral;   LOWER EXTREMITY ANGIOGRAPHY N/A 09/27/2018   Procedure: LOWER EXTREMITY ANGIOGRAPHY;  Surgeon: Elam Dutch, MD;  Location: [facility];  Service: Cardiovascular;  Laterality: N/A;   NM MYOVIEW LTD  12/2014   [facility]: Normal Myoview.  Nonischemic.   TRANSTHORACIC ECHOCARDIOGRAM  12/2011   EF 55 to 60%.  No regional wall motion normality.  GRII DD.  No significant valve disease.     OB History     Gravida      Para      Term      Preterm      AB      Living  3      SAB      IAB      Ectopic      Multiple      Live Births              Family History  Problem Relation Age of Onset   Hypertension Mother    Peripheral Artery Disease Brother    Colon cancer Neg Hx    Colon polyps Neg Hx     Social History   Tobacco Use   Smoking status: Every Day    Packs/day: 0.50    Years: 40.00    Pack years: 20.00    Types: Cigarettes   Smokeless tobacco: Never  Vaping Use   Vaping Use: Never used  Substance Use Topics   Alcohol use: No    Alcohol/week: 0.0 standard drinks   Drug use: No    Home Medications Prior to Admission medications   Medication Sig Start Date End Date Taking? Authorizing Provider  amLODipine (NORVASC) 5 MG tablet Take 5 mg by mouth daily.    [provider]  atorvastatin (LIPITOR) 20 MG tablet Take 1 tablet (20 mg total) by mouth daily at 6 PM. 06/02/19   Barton Dubois, MD  LINZESS 290 MCG CAPS capsule TAKE 1 CAPSULE(290 MCG) BY MOUTH DAILY BEFORE BREAKFAST 10/12/20   Mahala Menghini, PA-C  oxybutynin (DITROPAN-XL) 5 MG 24 hr tablet Take 5 mg by mouth daily. 07/15/20   [provider]  pantoprazole (PROTONIX) 40 MG tablet Take 1 tablet (40 mg total) by mouth 2 (two) times daily. 02/17/21 03/19/21  Ezequiel Essex, MD    Allergies    Patient has no known allergies.  Review of Systems   Review of Systems  Respiratory:  Positive for shortness of breath.   Cardiovascular:  Positive for chest pain.  Gastrointestinal:  Positive for nausea and vomiting. Negative for abdominal pain and diarrhea.  Neurological:  Positive for dizziness and headaches.  Psychiatric/Behavioral:  Negative for confusion.   All other systems reviewed and  are negative.  Physical Exam Updated Vital Signs BP 132/73    Pulse 62    Temp 98.1 F (36.7 C) (Oral)    Resp 18    Ht 5\' 2"  (1.575 m)    Wt 61.2 kg    SpO2 100%    BMI 24.69 kg/m   Physical Exam Vitals and nursing note reviewed.  Constitutional:      General: She is not in acute distress.    Appearance: She is not ill-appearing or toxic-appearing.  HENT:     Head: Normocephalic.     Nose: Nose normal. No congestion.     Mouth/Throat:     Mouth: Mucous membranes are moist.  Eyes:     Pupils: Pupils are equal,  round, and reactive to light.  Cardiovascular:     Rate and Rhythm: Normal rate and regular rhythm.  Pulmonary:     Effort: Pulmonary effort is normal. No respiratory distress.     Breath sounds: Normal breath sounds. No stridor. No wheezing, rhonchi or rales.  Abdominal:     General: Abdomen is flat.     Palpations: Abdomen is soft.     Tenderness: There is no abdominal tenderness.  Musculoskeletal:        General: Normal range of motion.     Cervical back: Normal range of motion.  Skin:    General: Skin is warm and dry.     Capillary Refill: Capillary refill takes less than 2 seconds.  Neurological:     Mental Status: She is alert and oriented to person, place, and time.  Psychiatric:        Mood and Affect: Mood normal.    ED Results / Procedures / Treatments   Labs (all labs ordered are listed, but only abnormal results are displayed) Labs Reviewed  CARBOXYHEMOGLOBIN - COOX - Abnormal; Notable for the following components:      Result Value   Carboxyhemoglobin 9.3 (*)    All other components within normal limits  RESP PANEL BY RT-PCR (FLU A&B, COVID) ARPGX2  TROPONIN I (HIGH SENSITIVITY)  TROPONIN I (HIGH SENSITIVITY)    EKG None  Radiology DG Chest 2 View  Result Date: 12/10/2021 CLINICAL DATA:  Chest pain EXAM: CHEST - 2 VIEW COMPARISON:  03/05/2021 FINDINGS: Heart and mediastinal contours are within normal limits. No focal opacities or  effusions. No acute bony abnormality. IMPRESSION: No active cardiopulmonary disease. Electronically Signed   By: Rolm Baptise M.D.   On: 12/10/2021 11:41    Procedures Procedures   Medications Ordered in ED Medications - No data to display  ED Course  I have reviewed the triage vital signs and the nursing notes.  Pertinent labs & imaging results that were available during my care of the patient were reviewed by me and considered in my medical decision making (see chart for details).    MDM Rules/Calculators/A&P                          65 year old female with history of COPD who is presently still smoking presents with suspicion for carbon monoxide exposure.  On examination patient is nonhypoxic, satting 98% on room air, speaking in full sentences fully alert and oriented.  Patient complaining of chest pain, shortness of breath, bitter taste in mouth.  Patient assessed using carboxyhemoglobin level, troponin, EKG and chest x-ray.  Carboxyhemoglobin level result is 9.3.  Patient does state that she has COPD and is currently smoking, but in smokers I would expect this number to be lower.  We will treat this patient utilizing high flow oxygen via NRB at 12LPM and then reassess for cessation of symptoms.  On reassessment, patient states she is feeling much better.  Shortness of breath subsided along with chest pain.  Bitter taste in her mouth is also subsided.  No longer experiencing headache. Patient vital signs are reassuring.  I discussed this patient and her case with Dr. Roderic Palau who is in agreement with plan for discharge.  I have instructed this patient to not utilize the space eaters at home and perhaps purchase new ones.  I provided the patient with return precautions and she voices understanding.  This patient is stable on discharge.  Final Clinical  Impression(s) / ED Diagnoses Final diagnoses:  Carbon monoxide exposure    Rx / DC Orders ED Discharge Orders     None         Lawana Chambers 12/10/21 1338    Milton Ferguson, MD 12/11/21 0800

## 2021-12-10 NOTE — Discharge Instructions (Addendum)
Return to ED with any new or worsening symptoms such as shortness of breath, chest pain, confusion, headaches Purchase a new space heater for your home Please read attached informational brochures concerning car monoxide poisoning

## 2021-12-10 NOTE — ED Notes (Signed)
Pt placed on NRB @ 12L per orders.

## 2021-12-21 ENCOUNTER — Other Ambulatory Visit (HOSPITAL_COMMUNITY): Payer: Self-pay | Admitting: Gerontology

## 2021-12-21 ENCOUNTER — Ambulatory Visit (HOSPITAL_COMMUNITY)
Admission: RE | Admit: 2021-12-21 | Discharge: 2021-12-21 | Disposition: A | Discharge status: Home / Self Care | Payer: Medicare HMO | Source: Ambulatory Visit | Attending: Gerontology | Admitting: Gerontology

## 2021-12-21 ENCOUNTER — Other Ambulatory Visit (HOSPITAL_COMMUNITY)
Admission: RE | Admit: 2021-12-21 | Discharge: 2021-12-21 | Disposition: A | Discharge status: Home / Self Care | Payer: Medicare HMO | Source: Ambulatory Visit | Attending: Internal Medicine | Admitting: Internal Medicine

## 2021-12-21 ENCOUNTER — Other Ambulatory Visit: Payer: Self-pay

## 2021-12-21 ENCOUNTER — Emergency Department (HOSPITAL_COMMUNITY)
Admission: EM | Admit: 2021-12-21 | Discharge: 2021-12-21 | Discharge status: Against Medical Advice | Payer: Medicare HMO | Source: Home / Self Care

## 2021-12-21 DIAGNOSIS — I10 Essential (primary) hypertension: Secondary | ICD-10-CM | POA: Diagnosis not present

## 2021-12-21 DIAGNOSIS — R1010 Upper abdominal pain, unspecified: Secondary | ICD-10-CM

## 2021-12-21 DIAGNOSIS — Z0001 Encounter for general adult medical examination with abnormal findings: Secondary | ICD-10-CM | POA: Insufficient documentation

## 2021-12-21 DIAGNOSIS — K219 Gastro-esophageal reflux disease without esophagitis: Secondary | ICD-10-CM | POA: Diagnosis not present

## 2021-12-21 DIAGNOSIS — R109 Unspecified abdominal pain: Secondary | ICD-10-CM | POA: Diagnosis not present

## 2021-12-21 LAB — CBC WITH DIFFERENTIAL/PLATELET
Abs Immature Granulocytes: 0.03 10*3/uL (ref 0.00–0.07)
Basophils Absolute: 0 10*3/uL (ref 0.0–0.1)
Basophils Relative: 0 %
Eosinophils Absolute: 0.1 10*3/uL (ref 0.0–0.5)
Eosinophils Relative: 1 %
HCT: 43.9 % (ref 36.0–46.0)
Hemoglobin: 14.1 g/dL (ref 12.0–15.0)
Immature Granulocytes: 0 %
Lymphocytes Relative: 27 %
Lymphs Abs: 2.7 10*3/uL (ref 0.7–4.0)
MCH: 28.5 pg (ref 26.0–34.0)
MCHC: 32.1 g/dL (ref 30.0–36.0)
MCV: 88.9 fL (ref 80.0–100.0)
Monocytes Absolute: 0.5 10*3/uL (ref 0.1–1.0)
Monocytes Relative: 5 %
Neutro Abs: 6.8 10*3/uL (ref 1.7–7.7)
Neutrophils Relative %: 67 %
Platelets: 267 10*3/uL (ref 150–400)
RBC: 4.94 MIL/uL (ref 3.87–5.11)
RDW: 14.7 % (ref 11.5–15.5)
WBC: 10.2 10*3/uL (ref 4.0–10.5)
nRBC: 0 % (ref 0.0–0.2)

## 2021-12-21 LAB — VITAMIN D 25 HYDROXY (VIT D DEFICIENCY, FRACTURES): Vit D, 25-Hydroxy: 10.13 ng/mL — ABNORMAL LOW (ref 30–100)

## 2021-12-21 LAB — BASIC METABOLIC PANEL
Anion gap: 8 (ref 5–15)
BUN: 11 mg/dL (ref 8–23)
CO2: 25 mmol/L (ref 22–32)
Calcium: 8.7 mg/dL — ABNORMAL LOW (ref 8.9–10.3)
Chloride: 109 mmol/L (ref 98–111)
Creatinine, Ser: 0.94 mg/dL (ref 0.44–1.00)
GFR, Estimated: >60 mL/min (ref >60)
Glucose, Bld: 99 mg/dL (ref 70–99)
Potassium: 3.4 mmol/L — ABNORMAL LOW (ref 3.5–5.1)
Sodium: 142 mmol/L (ref 135–145)

## 2021-12-21 LAB — HEPATIC FUNCTION PANEL
ALT: 15 U/L (ref 0–44)
AST: 15 U/L (ref 15–41)
Albumin: 4 g/dL (ref 3.5–5.0)
Alkaline Phosphatase: 110 U/L (ref 38–126)
Bilirubin, Direct: <0.1 mg/dL (ref 0.0–0.2)
Total Bilirubin: 0.5 mg/dL (ref 0.3–1.2)
Total Protein: 7.1 g/dL (ref 6.5–8.1)

## 2021-12-21 LAB — LIPID PANEL
Cholesterol: 209 mg/dL — ABNORMAL HIGH (ref 0–200)
HDL: 40 mg/dL — ABNORMAL LOW (ref >40)
LDL Cholesterol: 150 mg/dL — ABNORMAL HIGH (ref 0–99)
Total CHOL/HDL Ratio: 5.2 RATIO
Triglycerides: 97 mg/dL (ref <150)
VLDL: 19 mg/dL (ref 0–40)

## 2021-12-22 DIAGNOSIS — Z0001 Encounter for general adult medical examination with abnormal findings: Secondary | ICD-10-CM | POA: Diagnosis not present

## 2021-12-22 DIAGNOSIS — R69 Illness, unspecified: Secondary | ICD-10-CM | POA: Diagnosis not present

## 2021-12-22 DIAGNOSIS — K219 Gastro-esophageal reflux disease without esophagitis: Secondary | ICD-10-CM | POA: Diagnosis not present

## 2021-12-22 DIAGNOSIS — F17210 Nicotine dependence, cigarettes, uncomplicated: Secondary | ICD-10-CM | POA: Diagnosis not present

## 2021-12-22 DIAGNOSIS — J449 Chronic obstructive pulmonary disease, unspecified: Secondary | ICD-10-CM | POA: Diagnosis not present

## 2021-12-22 DIAGNOSIS — I10 Essential (primary) hypertension: Secondary | ICD-10-CM | POA: Diagnosis not present

## 2021-12-22 DIAGNOSIS — Z1389 Encounter for screening for other disorder: Secondary | ICD-10-CM | POA: Diagnosis not present

## 2021-12-22 DIAGNOSIS — Z1331 Encounter for screening for depression: Secondary | ICD-10-CM | POA: Diagnosis not present

## 2022-01-12 ENCOUNTER — Ambulatory Visit (INDEPENDENT_AMBULATORY_CARE_PROVIDER_SITE_OTHER): Payer: Medicare HMO | Admitting: Internal Medicine

## 2022-01-12 ENCOUNTER — Other Ambulatory Visit: Payer: Self-pay

## 2022-01-12 ENCOUNTER — Encounter: Payer: Self-pay | Admitting: Internal Medicine

## 2022-01-12 VITALS — BP 125/71 | HR 76 | Temp 97.1°F | Ht 62.0 in | Wt 138.8 lb

## 2022-01-12 DIAGNOSIS — K581 Irritable bowel syndrome with constipation: Secondary | ICD-10-CM

## 2022-01-12 DIAGNOSIS — K219 Gastro-esophageal reflux disease without esophagitis: Secondary | ICD-10-CM | POA: Diagnosis not present

## 2022-01-12 DIAGNOSIS — K589 Irritable bowel syndrome without diarrhea: Secondary | ICD-10-CM | POA: Insufficient documentation

## 2022-01-12 DIAGNOSIS — R140 Abdominal distension (gaseous): Secondary | ICD-10-CM | POA: Diagnosis not present

## 2022-01-12 MED ORDER — SIMETHICONE 125 MG PO CHEW: CHEWABLE_TABLET | ORAL | 5 refills | Status: AC

## 2022-01-12 NOTE — Progress Notes (Signed)
Referring Provider: Carrolyn Meiers* Primary Care Physician:  Carrolyn Meiers, MD Primary GI:  Dr. Abbey Chatters  Chief Complaint  Patient presents with   Bloated    Abdomen swells when she eats. Feels like food stops in upper abd. Bowels moving ok.    HPI:   Virginia Washington is a 66 y.o. female who presents to clinic today for follow-up visit.  She has a history of GERD well-controlled on pantoprazole.  Also with history of constipation well controlled on Linzess.  No refills needed today.  Main complaint for me today is worsening abdominal bloating.  Primarily happens after meals.  Notes abdominal distention and occasional pain.  Pain mild in severity, does not radiate.  No melena hematochezia.  History of dysphagia.  Underwent EGD 10/05/2020 which showed LA grade B esophagitis, small hiatal hernia.  Empirically dilated with 20 mm balloon.  Initially placed on pantoprazole twice daily, now down to once daily.  Dysphagia resolved.  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  Normal right aortoiliac vessels.  Three-vessel runoff bilaterally.  (Sluggish flow of the left side due to occlusion.   ABDOMINAL HYSTERECTOMY     BALLOON DILATION N/A 10/05/2020   Procedure: BALLOON DILATION;  Surgeon: [name], DO;  [facility];  Service: Endoscopy;   Laterality: N/A;   BIOPSY  10/05/2020   Procedure: BIOPSY;  Surgeon: [name], DO;  [facility];  Service: Endoscopy;;   CHOLECYSTECTOMY     COLONOSCOPY  2007   Dr. Laural Golden: small internal hemorrhoids, otherwise normal. exam could be compromised due to quality of prep   COLONOSCOPY N/A 02/15/2018   Dr. Oneida Alar: 2 simple adenomas, external and internal hemorrhoids. Next colonoscopy 2024-2026   ESOPHAGOGASTRODUODENOSCOPY N/A 05/09/2018   Procedure: ESOPHAGOGASTRODUODENOSCOPY (EGD);  Surgeon: Danie Binder, MD; web in the distal esophagus s/p dilated, mild gastritis biopsied, normal examined duodenum. Pathology with gastritis, negative for H. Pylori.     ESOPHAGOGASTRODUODENOSCOPY (EGD) WITH PROPOFOL N/A 10/05/2020   Procedure: ESOPHAGOGASTRODUODENOSCOPY (EGD) WITH PROPOFOL;  Surgeon: [name], DO;  [facility];  Service: Endoscopy;  Laterality: N/A;  9:00am   FEMORAL-FEMORAL BYPASS GRAFT Bilateral 10/21/2018   Procedure: RIGHT TO LEFT FEMORAL ARTERY BYPASS GRAFT;  Surgeon: Elam Dutch, MD;  Location: [facility];  Service: Vascular;  Laterality: Bilateral;   LOWER EXTREMITY ANGIOGRAPHY N/A 09/27/2018   Procedure: LOWER EXTREMITY ANGIOGRAPHY;  Surgeon: Elam Dutch, MD;  Location: [facility];  Service: Cardiovascular;  Laterality: N/A;   NM MYOVIEW LTD  12/2014   [facility]: Normal Myoview.  Nonischemic.   TRANSTHORACIC ECHOCARDIOGRAM  12/2011   EF 55 to 60%.  No regional wall motion normality.  GRII DD.  No significant valve disease.    Current Outpatient Medications  Medication Sig Dispense Refill  amLODipine (NORVASC) 5 MG tablet Take 5 mg by mouth daily.     atorvastatin (LIPITOR) 20 MG tablet Take 1 tablet (20 mg total) by mouth daily at 6 PM. 30 tablet 1   LINZESS 290 MCG CAPS capsule TAKE 1 CAPSULE(290 MCG) BY MOUTH DAILY BEFORE BREAKFAST 90 capsule 3   oxybutynin (DITROPAN-XL) 5 MG 24 hr tablet Take 5 mg by mouth daily.      pantoprazole (PROTONIX) 40 MG tablet Take 1 tablet (40 mg total) by mouth 2 (two) times daily. 60 tablet 0   simethicone (MYLICON) 299 MG chewable tablet Chew 1 tablet after each meal and at bedtime if needed 120 tablet 5   No current facility-administered medications for this visit.    Allergies as of 01/12/2022   (No Known Allergies)    Family History  Problem Relation Age of Onset   Hypertension Mother    Peripheral Artery Disease Brother    Colon cancer Neg Hx    Colon polyps Neg Hx     Social History   Socioeconomic History   Marital status: Widowed    Spouse name: Not on file   Number of children: Not on file   Years of education: Not on file   Highest education level: Not on file  Occupational History   Occupation: unemployed    Comment: Draws pension from her husband's life insurance policy  Tobacco Use   Smoking status: Every Day    Packs/day: 0.50    Years: 40.00    Pack years: 20.00    Types: Cigarettes   Smokeless tobacco: Never  Vaping Use   Vaping Use: Never used  Substance and Sexual Activity   Alcohol use: No    Alcohol/week: 0.0 standard drinks   Drug use: No   Sexual activity: Not Currently  Other Topics Concern   Not on file  Social History Narrative   Widowed: [employer].  mother 7, grandmother of 98 with one great grandchild.  She currently lives alone.  She completed ninth grade.  Smokes a pack a day for 42 years.  Tries to walks for about 5 minutes at x7 days a week with plans to help revascularization.       HOBBIES: PUZZLE BOOKS.   Social Determinants of Health   Financial Resource Strain: Not on file  Food Insecurity: Not on file  Transportation Needs: Not on file  Physical Activity: Not on file  Stress: Not on file  Social Connections: Not on file    Subjective: Review of Systems  Constitutional:  Negative for chills and fever.  HENT:  Negative for congestion and hearing loss.   Eyes:  Negative for blurred vision  and double vision.  Respiratory:  Negative for cough and shortness of breath.   Cardiovascular:  Negative for chest pain and palpitations.  Gastrointestinal:  Positive for constipation and heartburn. Negative for abdominal pain, blood in stool, diarrhea, melena and vomiting.       Bloating  Genitourinary:  Negative for dysuria and urgency.  Musculoskeletal:  Negative for joint pain and myalgias.  Skin:  Negative for itching and rash.  Neurological:  Negative for dizziness and headaches.  Psychiatric/Behavioral:  Negative for depression. The patient is not nervous/anxious.     Objective: BP 125/71    Pulse 76    Temp (!) 97.1 F (36.2 C) (Temporal)    Ht 5\' 2"  (1.575 m)    Wt 138 lb 12.8 oz (63 kg)    BMI 25.39  kg/m  Physical Exam Constitutional:      Appearance: Normal appearance.  HENT:     Head: Normocephalic and atraumatic.  Eyes:     Extraocular Movements: Extraocular movements intact.     Conjunctiva/sclera: Conjunctivae normal.  Cardiovascular:     Rate and Rhythm: Normal rate and regular rhythm.  Pulmonary:     Effort: Pulmonary effort is normal.     Breath sounds: Normal breath sounds.  Abdominal:     General: Bowel sounds are normal.     Palpations: Abdomen is soft.  Musculoskeletal:        General: No swelling. Normal range of motion.     Cervical back: Normal range of motion and neck supple.  Skin:    General: Skin is warm and dry.     Coloration: Skin is not jaundiced.  Neurological:     General: No focal deficit present.     Mental Status: She is alert and oriented to person, place, and time.  Psychiatric:        Mood and Affect: Mood normal.        Behavior: Behavior normal.     Assessment: *Abdominal bloating *GERD-well-controlled on pantoprazole *Constipation well controlled on Linzess 290 mcg daily  Plan: GERD well-controlled on pantoprazole, will continue.  Constipation well controlled on Linzess, will continue.  For her abdominal bloating, I  recommend she start taking simethicone after each meal and at bedtime if needed.  Also recommend she start taking a daily probiotic such as align.  Follow-up in 4 months or sooner if needed.  01/12/2022 5:15 PM   Disclaimer: This note was dictated with voice recognition software. Similar sounding words can inadvertently be transcribed and may not be corrected upon review.

## 2022-01-12 NOTE — Patient Instructions (Signed)
For your abdominal bloating, I want you to start taking simethicone 125 mg chewable tablet after each meal.  You can also take it at bedtime if you are still having issues.  I would also recommend start taking a probiotic daily such as align.  Continue on pantoprazole for your chronic reflux.  Continue on Linzess for chronic constipation.  Follow-up in 3 to 4 months.  It was nice seeing you again today.  Dr. Abbey Chatters  At [facility] we value your feedback. You may receive a survey about your visit today. Please share your experience as we strive to create trusting relationships with our patients to provide genuine, compassionate, quality care.  We appreciate your understanding and patience as we review any laboratory studies, imaging, and other diagnostic tests that are ordered as we care for you. Our office policy is 5 business days for review of these results, and any emergent or urgent results are addressed in a timely manner for your best interest. If you do not hear from our office in 1 week, please contact us.   We also encourage the use of MyChart, which contains your medical information for your review as well. If you are not enrolled in this feature, an access code is on this after visit summary for your convenience. Thank you for allowing [place] to be involved in your care.  It was great to see you today!  I hope you have a great rest of your Winter!    [place]ia [place]. Abbey Chatters, D.O. Gastroenterology and Hepatology [facility]

## 2022-01-19 ENCOUNTER — Ambulatory Visit: Payer: Medicare HMO | Admitting: Internal Medicine

## 2022-01-22 DIAGNOSIS — R69 Illness, unspecified: Secondary | ICD-10-CM | POA: Diagnosis not present

## 2022-01-22 DIAGNOSIS — I10 Essential (primary) hypertension: Secondary | ICD-10-CM | POA: Diagnosis not present

## 2022-02-19 DIAGNOSIS — I10 Essential (primary) hypertension: Secondary | ICD-10-CM | POA: Diagnosis not present

## 2022-02-19 DIAGNOSIS — I739 Peripheral vascular disease, unspecified: Secondary | ICD-10-CM | POA: Diagnosis not present

## 2022-03-01 DIAGNOSIS — K219 Gastro-esophageal reflux disease without esophagitis: Secondary | ICD-10-CM | POA: Diagnosis not present

## 2022-03-01 DIAGNOSIS — I10 Essential (primary) hypertension: Secondary | ICD-10-CM | POA: Diagnosis not present

## 2022-03-01 DIAGNOSIS — M79622 Pain in left upper arm: Secondary | ICD-10-CM | POA: Diagnosis not present

## 2022-03-08 ENCOUNTER — Other Ambulatory Visit: Payer: Self-pay

## 2022-03-08 ENCOUNTER — Ambulatory Visit (HOSPITAL_COMMUNITY)
Admission: RE | Admit: 2022-03-08 | Discharge: 2022-03-08 | Disposition: A | Discharge status: Home / Self Care | Payer: Medicare HMO | Source: Ambulatory Visit | Attending: Internal Medicine | Admitting: Internal Medicine

## 2022-03-08 ENCOUNTER — Other Ambulatory Visit (HOSPITAL_COMMUNITY)
Admission: RE | Admit: 2022-03-08 | Discharge: 2022-03-08 | Disposition: A | Discharge status: Home / Self Care | Payer: Medicare HMO | Source: Ambulatory Visit | Attending: Internal Medicine | Admitting: Internal Medicine

## 2022-03-08 DIAGNOSIS — E785 Hyperlipidemia, unspecified: Secondary | ICD-10-CM | POA: Insufficient documentation

## 2022-03-08 LAB — BASIC METABOLIC PANEL
Anion gap: 10 (ref 5–15)
BUN: 23 mg/dL (ref 8–23)
CO2: 24 mmol/L (ref 22–32)
Calcium: 9 mg/dL (ref 8.9–10.3)
Chloride: 106 mmol/L (ref 98–111)
Creatinine, Ser: 1.05 mg/dL — ABNORMAL HIGH (ref 0.44–1.00)
GFR, Estimated: 60 mL/min — ABNORMAL LOW (ref >60)
Glucose, Bld: 85 mg/dL (ref 70–99)
Potassium: 3.8 mmol/L (ref 3.5–5.1)
Sodium: 140 mmol/L (ref 135–145)

## 2022-03-08 LAB — CBC WITH DIFFERENTIAL/PLATELET
Abs Immature Granulocytes: 0.02 10*3/uL (ref 0.00–0.07)
Basophils Absolute: 0.1 10*3/uL (ref 0.0–0.1)
Basophils Relative: 1 %
Eosinophils Absolute: 0.1 10*3/uL (ref 0.0–0.5)
Eosinophils Relative: 1 %
HCT: 45.4 % (ref 36.0–46.0)
Hemoglobin: 14.1 g/dL (ref 12.0–15.0)
Immature Granulocytes: 0 %
Lymphocytes Relative: 43 %
Lymphs Abs: 3.5 10*3/uL (ref 0.7–4.0)
MCH: 26.9 pg (ref 26.0–34.0)
MCHC: 31.1 g/dL (ref 30.0–36.0)
MCV: 86.6 fL (ref 80.0–100.0)
Monocytes Absolute: 0.6 10*3/uL (ref 0.1–1.0)
Monocytes Relative: 8 %
Neutro Abs: 3.9 10*3/uL (ref 1.7–7.7)
Neutrophils Relative %: 47 %
Platelets: 225 10*3/uL (ref 150–400)
RBC: 5.24 MIL/uL — ABNORMAL HIGH (ref 3.87–5.11)
RDW: 14.1 % (ref 11.5–15.5)
WBC: 8.2 10*3/uL (ref 4.0–10.5)
nRBC: 0 % (ref 0.0–0.2)

## 2022-03-14 ENCOUNTER — Other Ambulatory Visit: Payer: Self-pay | Admitting: *Deleted

## 2022-03-14 NOTE — Patient Outreach (Signed)
[facility]) Care Management ? ?03/14/2022 ? ?Delmar Landau ?[birth year] ?[id number] ? ?RN sent sixth month educational mailing to patient on high and low salt foods. Patient had declined the [facility] services of the Social worker, Pharmacy and RN telephone assessment. ? ?Plan ?Education mailing of High and low sodium foods ? ?Johny Shock BSN RN ?[facility] ?[phone] ? ? ?

## 2022-03-15 DIAGNOSIS — I700 Atherosclerosis of aorta: Secondary | ICD-10-CM | POA: Diagnosis not present

## 2022-03-15 DIAGNOSIS — R69 Illness, unspecified: Secondary | ICD-10-CM | POA: Diagnosis not present

## 2022-03-15 DIAGNOSIS — R42 Dizziness and giddiness: Secondary | ICD-10-CM | POA: Diagnosis not present

## 2022-03-15 DIAGNOSIS — I10 Essential (primary) hypertension: Secondary | ICD-10-CM | POA: Diagnosis not present

## 2022-03-20 ENCOUNTER — Emergency Department (HOSPITAL_COMMUNITY)
Admission: EM | Admit: 2022-03-20 | Discharge: 2022-03-20 | Disposition: A | Discharge status: Home / Self Care | Payer: Medicare HMO | Attending: Emergency Medicine | Admitting: Emergency Medicine

## 2022-03-20 ENCOUNTER — Emergency Department (HOSPITAL_COMMUNITY): Payer: Medicare HMO

## 2022-03-20 ENCOUNTER — Encounter (HOSPITAL_COMMUNITY): Payer: Self-pay | Admitting: *Deleted

## 2022-03-20 DIAGNOSIS — R531 Weakness: Secondary | ICD-10-CM | POA: Insufficient documentation

## 2022-03-20 DIAGNOSIS — R109 Unspecified abdominal pain: Secondary | ICD-10-CM | POA: Diagnosis not present

## 2022-03-20 DIAGNOSIS — K219 Gastro-esophageal reflux disease without esophagitis: Secondary | ICD-10-CM | POA: Insufficient documentation

## 2022-03-20 DIAGNOSIS — R110 Nausea: Secondary | ICD-10-CM | POA: Diagnosis not present

## 2022-03-20 DIAGNOSIS — I700 Atherosclerosis of aorta: Secondary | ICD-10-CM | POA: Diagnosis not present

## 2022-03-20 DIAGNOSIS — E860 Dehydration: Secondary | ICD-10-CM | POA: Insufficient documentation

## 2022-03-20 DIAGNOSIS — Z79899 Other long term (current) drug therapy: Secondary | ICD-10-CM | POA: Insufficient documentation

## 2022-03-20 DIAGNOSIS — R1084 Generalized abdominal pain: Secondary | ICD-10-CM | POA: Diagnosis not present

## 2022-03-20 LAB — CBC WITH DIFFERENTIAL/PLATELET
Abs Immature Granulocytes: 0.02 10*3/uL (ref 0.00–0.07)
Basophils Absolute: 0 10*3/uL (ref 0.0–0.1)
Basophils Relative: 0 %
Eosinophils Absolute: 0.1 10*3/uL (ref 0.0–0.5)
Eosinophils Relative: 1 %
HCT: 45.7 % (ref 36.0–46.0)
Hemoglobin: 15 g/dL (ref 12.0–15.0)
Immature Granulocytes: 0 %
Lymphocytes Relative: 26 %
Lymphs Abs: 2.1 10*3/uL (ref 0.7–4.0)
MCH: 28 pg (ref 26.0–34.0)
MCHC: 32.8 g/dL (ref 30.0–36.0)
MCV: 85.4 fL (ref 80.0–100.0)
Monocytes Absolute: 0.4 10*3/uL (ref 0.1–1.0)
Monocytes Relative: 5 %
Neutro Abs: 5.3 10*3/uL (ref 1.7–7.7)
Neutrophils Relative %: 68 %
Platelets: 229 10*3/uL (ref 150–400)
RBC: 5.35 MIL/uL — ABNORMAL HIGH (ref 3.87–5.11)
RDW: 14 % (ref 11.5–15.5)
WBC: 7.9 10*3/uL (ref 4.0–10.5)
nRBC: 0 % (ref 0.0–0.2)

## 2022-03-20 LAB — COMPREHENSIVE METABOLIC PANEL
ALT: 18 U/L (ref 0–44)
AST: 17 U/L (ref 15–41)
Albumin: 4.1 g/dL (ref 3.5–5.0)
Alkaline Phosphatase: 103 U/L (ref 38–126)
Anion gap: 8 (ref 5–15)
BUN: 20 mg/dL (ref 8–23)
CO2: 25 mmol/L (ref 22–32)
Calcium: 9 mg/dL (ref 8.9–10.3)
Chloride: 104 mmol/L (ref 98–111)
Creatinine, Ser: 1.14 mg/dL — ABNORMAL HIGH (ref 0.44–1.00)
GFR, Estimated: 54 mL/min — ABNORMAL LOW (ref >60)
Glucose, Bld: 97 mg/dL (ref 70–99)
Potassium: 3.5 mmol/L (ref 3.5–5.1)
Sodium: 137 mmol/L (ref 135–145)
Total Bilirubin: 0.7 mg/dL (ref 0.3–1.2)
Total Protein: 7.5 g/dL (ref 6.5–8.1)

## 2022-03-20 LAB — ETHANOL: Alcohol, Ethyl (B): <10 mg/dL (ref <10)

## 2022-03-20 LAB — LACTIC ACID, PLASMA: Lactic Acid, Venous: 0.9 mmol/L (ref 0.5–1.9)

## 2022-03-20 MED ORDER — SODIUM CHLORIDE 0.9 % IV BOLUS
1000.0000 mL | Freq: Once | INTRAVENOUS | Status: AC
  Administered 2022-03-20: 1000 mL via INTRAVENOUS

## 2022-03-20 MED ORDER — IOHEXOL 300 MG/ML  SOLN
100.0000 mL | Freq: Once | INTRAMUSCULAR | Status: AC | PRN
  Administered 2022-03-20: 75 mL via INTRAVENOUS

## 2022-03-20 NOTE — Discharge Instructions (Signed)
The testing today did not show any problems with your abdomen, including bowel obstruction or for signs of infection.  Make sure you are getting plenty of rest and drink a lot of fluids and take all of your prescribed medications.  Call your PCP for a follow-up appointment to be seen in a week. ?

## 2022-03-20 NOTE — ED Provider Notes (Signed)
?[place] ?Provider Note ? ? ?CSN: [id number] ?Arrival date & time: 03/20/22  1522 ? ?  ? ?History ? ?Chief Complaint  ?Patient presents with  ? Weakness  ? ? ?Virginia Washington is a 66 y.o. female. ? ?HPI ?Patient presents for abdominal pain and feeling dehydrated since yesterday.  She has had some nausea without vomiting.  She has previously had similar symptoms when she had a bowel obstruction.  This was treated with bowel rest and NG tube.  She has prior history of abdominal procedure for vascular purposes.  She denies fever, chills, shortness of breath, weakness or dizziness. ?  ? ?Home Medications ?Prior to Admission medications   ?Medication Sig Start Date End Date Taking? Authorizing Provider  ?amLODipine (NORVASC) 5 MG tablet Take 5 mg by mouth daily.   Yes [provider]  ?atorvastatin (LIPITOR) 20 MG tablet Take 1 tablet (20 mg total) by mouth daily at 6 PM. 06/02/19  Yes Barton Dubois, MD  ?baclofen (LIORESAL) 10 MG tablet Take 10 mg by mouth 2 (two) times daily. 03/01/22  Yes [provider]  ?hydrochlorothiazide (HYDRODIURIL) 12.5 MG tablet Take 12.5 mg by mouth daily. 03/02/22  Yes [provider]  ?LINZESS 290 MCG CAPS capsule TAKE 1 CAPSULE(290 MCG) BY MOUTH DAILY BEFORE BREAKFAST ?Patient taking differently: Take 290 mcg by mouth daily before breakfast. 10/12/20  Yes Mahala Menghini, PA-C  ?pantoprazole (PROTONIX) 40 MG tablet Take 1 tablet (40 mg total) by mouth 2 (two) times daily. 02/17/21 03/20/22 Yes Rancour, Annie Main, MD  ?ibuprofen (ADVIL) 800 MG tablet Take 800 mg by mouth 3 (three) times daily as needed. ?Patient not taking: Reported on 03/20/2022 03/01/22   [provider]  ?meloxicam (MOBIC) 7.5 MG tablet Take 7.5 mg by mouth daily. ?Patient not taking: Reported on 03/20/2022 12/19/21   [provider]  ?simethicone (MYLICON) 401 MG chewable tablet Chew 1 tablet after each meal and at bedtime if needed ?Patient not taking: Reported on  03/20/2022 01/12/22   [name], DO  ?tiZANidine (ZANAFLEX) 4 MG tablet  10/28/21   [provider]  ?   ? ?Allergies    ?Patient has no known allergies.   ? ?Review of Systems   ?Review of Systems ? ?Physical Exam ?Updated Vital Signs ?BP 128/78   Pulse 71   Temp 98.2 ?F (36.8 ?C)   Resp 14   Ht '5\' 2"'$  (1.575 m)   Wt 60.8 kg   SpO2 96%   BMI 24.51 kg/m?  ?Physical Exam ?Vitals and nursing note reviewed.  ?Constitutional:   ?   General: She is not in acute distress. ?   Appearance: She is well-developed. She is not ill-appearing, toxic-appearing or diaphoretic.  ?HENT:  ?   Head: Normocephalic and atraumatic.  ?   Right Ear: External ear normal.  ?   Left Ear: External ear normal.  ?Eyes:  ?   Conjunctiva/sclera: Conjunctivae normal.  ?   Pupils: Pupils are equal, round, and reactive to light.  ?Neck:  ?   Trachea: Phonation normal.  ?Cardiovascular:  ?   Rate and Rhythm: Normal rate and regular rhythm.  ?   Heart sounds: Normal heart sounds.  ?Pulmonary:  ?   Effort: Pulmonary effort is normal.  ?   Breath sounds: Normal breath sounds.  ?Abdominal:  ?   General: There is distension.  ?   Palpations: Abdomen is soft. There is no mass.  ?   Tenderness: There is  abdominal tenderness. There is no guarding.  ?   Hernia: No hernia is present.  ?Musculoskeletal:     ?   General: Normal range of motion.  ?   Cervical back: Normal range of motion and neck supple.  ?Skin: ?   General: Skin is warm and dry.  ?Neurological:  ?   Mental Status: She is alert and oriented to person, place, and time.  ?   Cranial Nerves: No cranial nerve deficit.  ?   Sensory: No sensory deficit.  ?   Motor: No abnormal muscle tone.  ?   Coordination: Coordination normal.  ?Psychiatric:     ?   Mood and Affect: Mood normal.     ?   Behavior: Behavior normal.     ?   Thought Content: Thought content normal.     ?   Judgment: Judgment normal.  ? ? ?ED Results / Procedures / Treatments   ?Labs ?(all labs ordered are listed, but  only abnormal results are displayed) ?Labs Reviewed  ?COMPREHENSIVE METABOLIC PANEL - Abnormal; Notable for the following components:  ?    Result Value  ? Creatinine, Ser 1.14 (*)   ? GFR, Estimated 54 (*)   ? All other components within normal limits  ?CBC WITH DIFFERENTIAL/PLATELET - Abnormal; Notable for the following components:  ? RBC 5.35 (*)   ? All other components within normal limits  ?ETHANOL  ?LACTIC ACID, PLASMA  ? ? ?EKG ?EKG Interpretation ? ?Date/Time:  Monday March 20 2022 15:56:44 EDT ?Ventricular Rate:  83 ?PR Interval:  160 ?QRS Duration: 72 ?QT Interval:  376 ?QTC Calculation: 441 ?R Axis:   62 ?Text Interpretation: Normal sinus rhythm Right atrial enlargement T wave abnormality, consider inferior ischemia Abnormal ECG When compared with ECG of 08-Mar-2022 15:15, Nonspecific T wave abnormality now evident in Anterolateral leads Otherwise no significant change Confirmed by Daleen Bo [phone]) on 03/20/2022 4:22:06 PM ? ?Radiology ?CT Abdomen Pelvis W Contrast ? ?Result Date: 03/20/2022 ?CLINICAL DATA:  Abdominal pain EXAM: CT ABDOMEN AND PELVIS WITH CONTRAST TECHNIQUE: Multidetector CT imaging of the abdomen and pelvis was performed using the standard protocol following bolus administration of intravenous contrast. RADIATION DOSE REDUCTION: This exam was performed according to the departmental dose-optimization program which includes automated exposure control, adjustment of the mA and/or kV according to patient size and/or use of iterative reconstruction technique. CONTRAST:  58m OMNIPAQUE IOHEXOL 300 MG/ML  SOLN COMPARISON:  CT abdomen and pelvis dated February 17, 2021 FINDINGS: Lower chest: No acute abnormality. Hepatobiliary: No focal liver abnormality is seen. Status post cholecystectomy. No biliary dilatation. Pancreas: Unremarkable. No pancreatic ductal dilatation or surrounding inflammatory changes. Spleen: Normal in size without focal abnormality. Adrenals/Urinary Tract: Adrenal glands are  unremarkable. Kidneys are normal, without renal calculi, focal lesion, or hydronephrosis. Bladder is unremarkable. Stomach/Bowel: Stomach is within normal limits. Appendix appears normal. No evidence of bowel wall thickening, distention, or inflammatory changes. Vascular/Lymphatic: Aortic atherosclerosis. 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 No enlarged abdominal or pelvic lymph nodes. Reproductive: No adnexal masses. Other: Surgical clips noted in the left pelvis and left lower quadrant. No abdominopelvic ascites. Musculoskeletal: No acute or significant osseous findings. IMPRESSION: 1. No acute findings in the abdomen or pelvis. 2. 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 3.  Aortic Atherosclerosis (ICD10-I70.0). Electronically Signed   By: LYetta GlassmanM.D.   On: 03/20/2022 18:16   ? ?Procedures ?Procedures  ? ? ?  Medications Ordered in ED ?Medications  ?sodium chloride 0.9 % bolus 1,000 mL (0 mLs Intravenous Stopped 03/20/22 1732)  ?iohexol (OMNIPAQUE) 300 MG/ML solution 100 mL (75 mLs Intravenous Contrast Given 03/20/22 1752)  ? ? ?ED Course/ Medical Decision Making/ A&P ?  ?                        ?Medical Decision Making ?Patient presenting for personal concern of bowel obstruction and dehydration.  She has previously had this problem and required NG tube drainage.  She denies vomiting, diarrhea, fever, chills or cough.  She requires evaluation to rule out small bowel obstruction and metabolic disorders including complications of intravascular volume status. ? ?Problems Addressed: ?Generalized abdominal pain: acute illness or injury ?   Details: Nonspecific findings, reassuring evaluation in the ED. ?Weakness: acute illness or injury ?   Details: Nonspecific malaise ? ?Amount and/or Complexity of Data Reviewed ?Independent Historian:  ?   Details: She is a cogent historian ?External Data Reviewed: notes. ?   Details: Previously  seen by gastroenterology, January 2023 for ongoing problems of abdominal bloating and constipation.  Also treated for GERD with PPI.  She has been prescribed Linzess for constipation. ?Labs: ordered. ?   Details

## 2022-03-20 NOTE — ED Triage Notes (Signed)
C/o weakness and feeling dehydrated onset last night ?

## 2022-03-27 DIAGNOSIS — E860 Dehydration: Secondary | ICD-10-CM | POA: Diagnosis not present

## 2022-03-27 DIAGNOSIS — I10 Essential (primary) hypertension: Secondary | ICD-10-CM | POA: Diagnosis not present

## 2022-03-27 DIAGNOSIS — R531 Weakness: Secondary | ICD-10-CM | POA: Diagnosis not present

## 2022-04-13 ENCOUNTER — Other Ambulatory Visit: Payer: Self-pay | Admitting: Gastroenterology

## 2022-04-14 NOTE — Telephone Encounter (Signed)
Last ov 01/12/22 ?

## 2022-04-18 ENCOUNTER — Encounter: Payer: Self-pay | Admitting: Internal Medicine

## 2022-04-20 ENCOUNTER — Telehealth: Payer: Self-pay | Admitting: Internal Medicine

## 2022-04-20 NOTE — Telephone Encounter (Signed)
Noted, pt aware.

## 2022-04-20 NOTE — Telephone Encounter (Signed)
Pt needs a refill on here Linzess sent in to [facility] on [address] ?

## 2022-04-20 NOTE — Telephone Encounter (Signed)
Patient needs her linzess sent to [facility] on [address] for refill  ?

## 2022-04-20 NOTE — Telephone Encounter (Signed)
Done

## 2022-04-27 ENCOUNTER — Emergency Department (HOSPITAL_COMMUNITY)
Admission: EM | Admit: 2022-04-27 | Discharge: 2022-04-27 | Discharge status: Against Medical Advice | Payer: Medicare HMO | Attending: Emergency Medicine | Admitting: Emergency Medicine

## 2022-04-27 ENCOUNTER — Emergency Department (HOSPITAL_COMMUNITY): Payer: Medicare HMO

## 2022-04-27 ENCOUNTER — Encounter (HOSPITAL_COMMUNITY): Payer: Self-pay | Admitting: Emergency Medicine

## 2022-04-27 ENCOUNTER — Other Ambulatory Visit: Payer: Self-pay

## 2022-04-27 DIAGNOSIS — J9811 Atelectasis: Secondary | ICD-10-CM | POA: Diagnosis not present

## 2022-04-27 DIAGNOSIS — I10 Essential (primary) hypertension: Secondary | ICD-10-CM | POA: Diagnosis not present

## 2022-04-27 DIAGNOSIS — R1084 Generalized abdominal pain: Secondary | ICD-10-CM | POA: Diagnosis not present

## 2022-04-27 DIAGNOSIS — Z79899 Other long term (current) drug therapy: Secondary | ICD-10-CM | POA: Diagnosis not present

## 2022-04-27 DIAGNOSIS — M549 Dorsalgia, unspecified: Secondary | ICD-10-CM | POA: Insufficient documentation

## 2022-04-27 DIAGNOSIS — Z743 Need for continuous supervision: Secondary | ICD-10-CM | POA: Diagnosis not present

## 2022-04-27 DIAGNOSIS — R42 Dizziness and giddiness: Secondary | ICD-10-CM | POA: Diagnosis not present

## 2022-04-27 DIAGNOSIS — R55 Syncope and collapse: Secondary | ICD-10-CM | POA: Diagnosis not present

## 2022-04-27 DIAGNOSIS — M545 Low back pain, unspecified: Secondary | ICD-10-CM | POA: Diagnosis not present

## 2022-04-27 DIAGNOSIS — J449 Chronic obstructive pulmonary disease, unspecified: Secondary | ICD-10-CM | POA: Insufficient documentation

## 2022-04-27 DIAGNOSIS — R079 Chest pain, unspecified: Secondary | ICD-10-CM | POA: Diagnosis not present

## 2022-04-27 LAB — COMPREHENSIVE METABOLIC PANEL
ALT: 16 U/L (ref 0–44)
AST: 17 U/L (ref 15–41)
Albumin: 3.8 g/dL (ref 3.5–5.0)
Alkaline Phosphatase: 99 U/L (ref 38–126)
Anion gap: 9 (ref 5–15)
BUN: 13 mg/dL (ref 8–23)
CO2: 25 mmol/L (ref 22–32)
Calcium: 8.8 mg/dL — ABNORMAL LOW (ref 8.9–10.3)
Chloride: 106 mmol/L (ref 98–111)
Creatinine, Ser: 1.06 mg/dL — ABNORMAL HIGH (ref 0.44–1.00)
GFR, Estimated: 59 mL/min — ABNORMAL LOW (ref >60)
Glucose, Bld: 95 mg/dL (ref 70–99)
Potassium: 3.5 mmol/L (ref 3.5–5.1)
Sodium: 140 mmol/L (ref 135–145)
Total Bilirubin: 0.8 mg/dL (ref 0.3–1.2)
Total Protein: 6.7 g/dL (ref 6.5–8.1)

## 2022-04-27 LAB — CBC WITH DIFFERENTIAL/PLATELET
Abs Immature Granulocytes: 0.04 10*3/uL (ref 0.00–0.07)
Basophils Absolute: 0.1 10*3/uL (ref 0.0–0.1)
Basophils Relative: 1 %
Eosinophils Absolute: 0 10*3/uL (ref 0.0–0.5)
Eosinophils Relative: 0 %
HCT: 44.2 % (ref 36.0–46.0)
Hemoglobin: 14.2 g/dL (ref 12.0–15.0)
Immature Granulocytes: 0 %
Lymphocytes Relative: 16 %
Lymphs Abs: 1.6 10*3/uL (ref 0.7–4.0)
MCH: 27.6 pg (ref 26.0–34.0)
MCHC: 32.1 g/dL (ref 30.0–36.0)
MCV: 85.8 fL (ref 80.0–100.0)
Monocytes Absolute: 0.7 10*3/uL (ref 0.1–1.0)
Monocytes Relative: 7 %
Neutro Abs: 7.6 10*3/uL (ref 1.7–7.7)
Neutrophils Relative %: 76 %
Platelets: 199 10*3/uL (ref 150–400)
RBC: 5.15 MIL/uL — ABNORMAL HIGH (ref 3.87–5.11)
RDW: 14.4 % (ref 11.5–15.5)
WBC: 10 10*3/uL (ref 4.0–10.5)
nRBC: 0 % (ref 0.0–0.2)

## 2022-04-27 LAB — TROPONIN I (HIGH SENSITIVITY)
Troponin I (High Sensitivity): 3 ng/L (ref <18)
Troponin I (High Sensitivity): 4 ng/L (ref <18)

## 2022-04-27 MED ORDER — IBUPROFEN 800 MG PO TABS
800.0000 mg | ORAL_TABLET | Freq: Once | ORAL | Status: AC
Start: 2022-04-27 — End: 2022-04-27
  Administered 2022-04-27: 800 mg via ORAL
  Filled 2022-04-27: qty 1

## 2022-04-27 MED ORDER — SODIUM CHLORIDE 0.9 % IV BOLUS
500.0000 mL | Freq: Once | INTRAVENOUS | Status: AC
  Administered 2022-04-27: 500 mL via INTRAVENOUS

## 2022-04-27 NOTE — ED Triage Notes (Signed)
Pt to the ED from Home with [facility] after a syncopal event at [facility]. The pt was standing in line and lost consciousness. Pt was incontinent during the event. ? ?Pt is unsure if she hit her head. ? ?Pt is also complaining of abdominal and back pain. ? ?

## 2022-04-27 NOTE — ED Provider Notes (Signed)
?[place] ?Provider Note ? ? ?CSN: [id number] ?Arrival date & time: 04/27/22  1727 ? ?  ? ?History ? ?Chief Complaint  ?Patient presents with  ? Loss of Consciousness  ? ? ?Virginia Washington is a 66 y.o. female. ? ?Patient states  she had a syncopal episode when she was at [employer].  Patient states this is happened to her before.  She complains of some back pain and headache.  Patient has a history of COPD and hypertension ? ?The history is provided by the patient and medical records. No language interpreter was used.  ?Loss of Consciousness ?Episode history:  Single ?Most recent episode:  Today ?Timing:  Intermittent ?Progression:  Resolved ?Chronicity:  New ?Context: not blood draw   ?Witnessed: no   ?Relieved by:  Nothing ?Worsened by:  Nothing ?Associated symptoms: no chest pain, no headaches and no seizures   ? ?  ? ?Home Medications ?Prior to Admission medications   ?Medication Sig Start Date End Date Taking? Authorizing Provider  ?amLODipine (NORVASC) 5 MG tablet Take 5 mg by mouth daily.   Yes [provider]  ?atorvastatin (LIPITOR) 20 MG tablet Take 1 tablet (20 mg total) by mouth daily at 6 PM. 06/02/19  Yes Barton Dubois, MD  ?baclofen (LIORESAL) 10 MG tablet Take 10 mg by mouth 2 (two) times daily. 03/01/22  Yes [provider]  ?hydrochlorothiazide (HYDRODIURIL) 12.5 MG tablet Take 12.5 mg by mouth daily. 03/02/22  Yes [provider]  ?linaclotide Rolan Lipa) 290 MCG CAPS capsule Take 1 capsule (290 mcg total) by mouth daily before breakfast. 04/20/22 04/20/23 Yes Carver, Elon Alas, DO  ?meloxicam (MOBIC) 7.5 MG tablet Take 7.5 mg by mouth daily. 12/19/21  Yes [provider]  ?pantoprazole (PROTONIX) 40 MG tablet Take 1 tablet (40 mg total) by mouth 2 (two) times daily. 02/17/21 04/27/22 Yes Rancour, Annie Main, MD  ?simethicone (MYLICON) 917 MG chewable tablet Chew 1 tablet after each meal and at bedtime if needed ?Patient not taking: Reported on 03/20/2022  01/12/22   [name], DO  ?tiZANidine (ZANAFLEX) 4 MG tablet  10/28/21   [provider]  ?   ? ?Allergies    ?Patient has no known allergies.   ? ?Review of Systems   ?Review of Systems  ?Constitutional:  Negative for appetite change and fatigue.  ?HENT:  Negative for congestion, ear discharge and sinus pressure.   ?     Headache  ?Eyes:  Negative for discharge.  ?Respiratory:  Negative for cough.   ?Cardiovascular:  Positive for syncope. Negative for chest pain.  ?Gastrointestinal:  Negative for abdominal pain and diarrhea.  ?Genitourinary:  Negative for frequency and hematuria.  ?Musculoskeletal:  Negative for back pain.  ?     Upper back pain  ?Skin:  Negative for rash.  ?Neurological:  Negative for seizures and headaches.  ?Psychiatric/Behavioral:  Negative for hallucinations.   ? ?Physical Exam ?Updated Vital Signs ?BP 109/72   Pulse 71   Temp 98.1 ?F (36.7 ?C)   Resp 16   Ht '5\' 2"'$  (1.575 m)   Wt 60.8 kg   SpO2 97%   BMI 24.52 kg/m?  ?Physical Exam ?Vitals and nursing note reviewed.  ?Constitutional:   ?   Appearance: She is well-developed.  ?HENT:  ?   Head: Normocephalic.  ?   Nose: Nose normal.  ?Eyes:  ?   General: No scleral icterus. ?   Conjunctiva/sclera: Conjunctivae normal.  ?Neck:  ?  Thyroid: No thyromegaly.  ?Cardiovascular:  ?   Rate and Rhythm: Normal rate and regular rhythm.  ?   Heart sounds: No murmur heard. ?  No friction rub. No gallop.  ?Pulmonary:  ?   Breath sounds: No stridor. No wheezing or rales.  ?Chest:  ?   Chest wall: No tenderness.  ?Abdominal:  ?   General: There is no distension.  ?   Tenderness: There is no abdominal tenderness. There is no rebound.  ?Musculoskeletal:  ?   Cervical back: Neck supple.  ?   Comments: Mild tenderness mid back  ?Lymphadenopathy:  ?   Cervical: No cervical adenopathy.  ?Skin: ?   Findings: No erythema or rash.  ?Neurological:  ?   Mental Status: She is alert and oriented to person, place, and time.  ?   Motor: No abnormal  muscle tone.  ?   Coordination: Coordination normal.  ?Psychiatric:     ?   Behavior: Behavior normal.  ? ? ?ED Results / Procedures / Treatments   ?Labs ?(all labs ordered are listed, but only abnormal results are displayed) ?Labs Reviewed  ?CBC WITH DIFFERENTIAL/PLATELET - Abnormal; Notable for the following components:  ?    Result Value  ? RBC 5.15 (*)   ? All other components within normal limits  ?COMPREHENSIVE METABOLIC PANEL - Abnormal; Notable for the following components:  ? Creatinine, Ser 1.06 (*)   ? Calcium 8.8 (*)   ? GFR, Estimated 59 (*)   ? All other components within normal limits  ?TROPONIN I (HIGH SENSITIVITY)  ?TROPONIN I (HIGH SENSITIVITY)  ? ? ?EKG ?None ? ?Radiology ?DG Lumbar Spine Complete ? ?Result Date: 04/27/2022 ?CLINICAL DATA:  Fall, back pain EXAM: LUMBAR SPINE - COMPLETE 4+ VIEW COMPARISON:  05/22/2006 FINDINGS: There is no evidence of lumbar spine fracture. Alignment is normal. Intervertebral disc spaces are maintained. Vascular calcifications noted within the abdominal aorta IMPRESSION: Negative. Electronically Signed   By: Fidela Salisbury M.D.   On: 04/27/2022 19:52  ? ?CT Head Wo Contrast ? ?Result Date: 04/27/2022 ?CLINICAL DATA:  Dizziness and loss of consciousness. EXAM: CT HEAD WITHOUT CONTRAST TECHNIQUE: Contiguous axial images were obtained from the base of the skull through the vertex without intravenous contrast. RADIATION DOSE REDUCTION: This exam was performed according to the departmental dose-optimization program which includes automated exposure control, adjustment of the mA and/or kV according to patient size and/or use of iterative reconstruction technique. COMPARISON:  June 03, 2020 FINDINGS: Brain: No evidence of acute infarction, hemorrhage, hydrocephalus, extra-axial collection or mass lesion/mass effect. Vascular: No hyperdense vessel or unexpected calcification. Skull: Normal. Negative for fracture or focal lesion. Sinuses/Orbits: No acute finding. Other: None.  IMPRESSION: No acute intracranial process. Electronically Signed   By: Virgina Norfolk M.D.   On: 04/27/2022 19:01  ? ?DG Chest Port 1 View ? ?Result Date: 04/27/2022 ?CLINICAL DATA:  Chest pain and back pain. EXAM: PORTABLE CHEST 1 VIEW COMPARISON:  December 10, 2020 FINDINGS: Multiple overlying cardiac lead wires are seen. The heart size and mediastinal contours are within normal limits. Very mild atelectasis is seen within the bilateral lung bases. Both lungs are otherwise clear. Radiopaque surgical clips are seen overlying the right upper quadrant. The visualized skeletal structures are unremarkable. IMPRESSION: No acute cardiopulmonary disease. Electronically Signed   By: Virgina Norfolk M.D.   On: 04/27/2022 18:59   ? ?Procedures ?Procedures  ? ? ?Medications Ordered in ED ?Medications  ?sodium chloride 0.9 % bolus 500 mL (0  mLs Intravenous Stopped 04/27/22 2008)  ?ibuprofen (ADVIL) tablet 800 mg (800 mg Oral Given 04/27/22 2014)  ? ? ?ED Course/ Medical Decision Making/ A&P ?  ?                        ?Medical Decision Making ?Amount and/or Complexity of Data Reviewed ?Labs: ordered. ?Radiology: ordered. ?ECG/medicine tests: ordered. ? ?Risk ?Prescription drug management. ? ?This patient presents to the ED for concern of syncope, this involves an extensive number of treatment options, and is a complaint that carries with it a high risk of complications and morbidity.  The differential diagnosis includes seizure, stroke, hypoglycemia ? ? ?Co morbidities that complicate the patient evaluation ? ?She PDN hypertension ? ? ?Additional history obtained: ? ?Additional history obtained from patient ?External records from outside source obtained and reviewed including hospital records ? ? ?Lab Tests: ? ?I Ordered, and personally interpreted labs.  The pertinent results include: CBC and chemistries unremarkable ? ? ?Imaging Studies ordered: ? ?I ordered imaging studies including chest x-ray and CT head ?I  independently visualized and interpreted imaging which showed unremarkable ?I agree with the radiologist interpretation ? ? ?Cardiac Monitoring: / EKG: ? ?The patient was maintained on a cardiac monitor.  I personally vi

## 2022-04-27 NOTE — ED Notes (Signed)
Pt states taht she is ready to leave.  Refused to sign ama.  Md informed.  ?

## 2022-05-06 ENCOUNTER — Other Ambulatory Visit: Payer: Self-pay

## 2022-05-06 ENCOUNTER — Emergency Department (HOSPITAL_COMMUNITY)
Admission: EM | Admit: 2022-05-06 | Discharge: 2022-05-06 | Disposition: A | Discharge status: Home / Self Care | Payer: Medicare HMO | Attending: Emergency Medicine | Admitting: Emergency Medicine

## 2022-05-06 ENCOUNTER — Encounter (HOSPITAL_COMMUNITY): Payer: Self-pay

## 2022-05-06 DIAGNOSIS — F17200 Nicotine dependence, unspecified, uncomplicated: Secondary | ICD-10-CM | POA: Diagnosis not present

## 2022-05-06 DIAGNOSIS — Z20822 Contact with and (suspected) exposure to covid-19: Secondary | ICD-10-CM | POA: Diagnosis not present

## 2022-05-06 DIAGNOSIS — Z79899 Other long term (current) drug therapy: Secondary | ICD-10-CM | POA: Insufficient documentation

## 2022-05-06 DIAGNOSIS — R69 Illness, unspecified: Secondary | ICD-10-CM | POA: Diagnosis not present

## 2022-05-06 LAB — RESP PANEL BY RT-PCR (FLU A&B, COVID) ARPGX2
Influenza A by PCR: NEGATIVE
Influenza B by PCR: NEGATIVE
SARS Coronavirus 2 by RT PCR: NEGATIVE

## 2022-05-06 NOTE — ED Provider Notes (Signed)
[facility] EMERGENCY DEPARTMENT Provider Note   CSN: [id number] Arrival date & time: 05/06/22  5102     History  Chief Complaint  Patient presents with   Covid Testing     Virginia Washington is a 66 y.o. female.  HPI 66 year old female presents today with complaints of COVID exposure.  She states that she was taking care of her granddaughter last Saturday.  The granddaughter became sick on Saturday.  She tested positive for COVID yesterday.  The patient is not having any symptoms except for some pain in the lateral aspect of her throat.  She denies any cough, fever, dyspnea, or GI symptoms.  She has had her COVID-vaccine and booster.  She is a smoker    Home Medications Prior to Admission medications   Medication Sig Start Date End Date Taking? Authorizing Provider  amLODipine (NORVASC) 5 MG tablet Take 5 mg by mouth daily.    [provider]  atorvastatin (LIPITOR) 20 MG tablet Take 1 tablet (20 mg total) by mouth daily at 6 PM. 06/02/19   Barton Dubois, MD  baclofen (LIORESAL) 10 MG tablet Take 10 mg by mouth 2 (two) times daily. 03/01/22   [provider]  hydrochlorothiazide (HYDRODIURIL) 12.5 MG tablet Take 12.5 mg by mouth daily. 03/02/22   [provider]  linaclotide Rolan Lipa) 290 MCG CAPS capsule Take 1 capsule (290 mcg total) by mouth daily before breakfast. 04/20/22 04/20/23  [name], DO  meloxicam (MOBIC) 7.5 MG tablet Take 7.5 mg by mouth daily. 12/19/21   [provider]  pantoprazole (PROTONIX) 40 MG tablet Take 1 tablet (40 mg total) by mouth 2 (two) times daily. 02/17/21 04/27/22  Rancour, Annie Main, MD  simethicone (MYLICON) 585 MG chewable tablet Chew 1 tablet after each meal and at bedtime if needed Patient not taking: Reported on 03/20/2022 01/12/22   [name], DO  tiZANidine (ZANAFLEX) 4 MG tablet  10/28/21   [provider]      Allergies    Patient has no known allergies.    Review of Systems   Review of  Systems  Physical Exam Updated Vital Signs BP 113/73   Pulse 79   Temp 97.9 F (36.6 C) (Oral)   Resp 16   Ht 1.575 m ('5\' 2"'$ )   Wt 61 kg   SpO2 97%   BMI 24.60 kg/m  Physical Exam Vitals and nursing note reviewed.  Constitutional:      General: She is not in acute distress.    Appearance: She is well-developed.  HENT:     Head: Normocephalic and atraumatic.     Right Ear: External ear normal.     Left Ear: External ear normal.     Nose: Nose normal.  Eyes:     Conjunctiva/sclera: Conjunctivae normal.     Pupils: Pupils are equal, round, and reactive to light.  Cardiovascular:     Rate and Rhythm: Normal rate and regular rhythm.  Pulmonary:     Effort: Pulmonary effort is normal.  Abdominal:     Palpations: Abdomen is soft.  Musculoskeletal:        General: Normal range of motion.     Cervical back: Normal range of motion and neck supple.  Skin:    General: Skin is warm and dry.  Neurological:     Mental Status: She is alert and oriented to person, place, and time.     Motor: No abnormal muscle tone.     Coordination: Coordination  normal.  Psychiatric:        Behavior: Behavior normal.        Thought Content: Thought content normal.    ED Results / Procedures / Treatments   Labs (all labs ordered are listed, but only abnormal results are displayed) Labs Reviewed  RESP PANEL BY RT-PCR (FLU A&B, COVID) [id number]    EKG None  Radiology No results found.  Procedures Procedures    Medications Ordered in ED Medications - No data to display  ED Course/ Medical Decision Making/ A&P Clinical Course as of 05/06/22 1003  Sat May 06, 2022  1002 COVID test reviewed and interpreted Testing is negative for COVID, influenza A, and influenza B [DR]    Clinical Course User Index [DR] Pattricia Boss, MD                           Medical Decision Making         Final Clinical Impression(s) / ED Diagnoses Final diagnoses:  Close exposure to COVID-19 virus     Rx / DC Orders ED Discharge Orders     None         Pattricia Boss, MD 05/06/22 1003

## 2022-05-06 NOTE — Discharge Instructions (Signed)
Your COVID test is negative here today It is unlikely that you have got COVID from your grandchild as you have not seen her for a week.  We would expect a positive COVID test.  However, if you began having symptoms, you should treated as if you have COVID and get retested and reevaluated at that time.

## 2022-05-06 NOTE — ED Notes (Signed)
Pt shown to lobby.  Verbalized understanding discharge instructions. In no acute distress.  ? ?

## 2022-05-06 NOTE — ED Triage Notes (Signed)
Patient kept her granddaughter who has tested positive for covid. Patient denies any symptoms and wants to be tested for covid.

## 2022-05-06 NOTE — ED Notes (Signed)
Pt has not complaints.  Sts she spent last Saturday and Sunday w/ someone who has since tested positive for COVID.

## 2022-05-10 DIAGNOSIS — I700 Atherosclerosis of aorta: Secondary | ICD-10-CM | POA: Diagnosis not present

## 2022-05-10 DIAGNOSIS — J449 Chronic obstructive pulmonary disease, unspecified: Secondary | ICD-10-CM | POA: Diagnosis not present

## 2022-05-10 DIAGNOSIS — I10 Essential (primary) hypertension: Secondary | ICD-10-CM | POA: Diagnosis not present

## 2022-05-10 DIAGNOSIS — K219 Gastro-esophageal reflux disease without esophagitis: Secondary | ICD-10-CM | POA: Diagnosis not present

## 2022-05-21 ENCOUNTER — Emergency Department (HOSPITAL_COMMUNITY): Payer: Medicare HMO

## 2022-05-21 ENCOUNTER — Emergency Department (HOSPITAL_COMMUNITY)
Admission: EM | Admit: 2022-05-21 | Discharge: 2022-05-21 | Disposition: A | Discharge status: Home / Self Care | Payer: Medicare HMO | Attending: Emergency Medicine | Admitting: Emergency Medicine

## 2022-05-21 ENCOUNTER — Encounter (HOSPITAL_COMMUNITY): Payer: Self-pay | Admitting: Emergency Medicine

## 2022-05-21 DIAGNOSIS — F17200 Nicotine dependence, unspecified, uncomplicated: Secondary | ICD-10-CM | POA: Diagnosis not present

## 2022-05-21 DIAGNOSIS — R059 Cough, unspecified: Secondary | ICD-10-CM | POA: Diagnosis not present

## 2022-05-21 DIAGNOSIS — I10 Essential (primary) hypertension: Secondary | ICD-10-CM | POA: Diagnosis not present

## 2022-05-21 DIAGNOSIS — R0602 Shortness of breath: Secondary | ICD-10-CM | POA: Insufficient documentation

## 2022-05-21 DIAGNOSIS — Z79899 Other long term (current) drug therapy: Secondary | ICD-10-CM | POA: Insufficient documentation

## 2022-05-21 DIAGNOSIS — J441 Chronic obstructive pulmonary disease with (acute) exacerbation: Secondary | ICD-10-CM | POA: Diagnosis not present

## 2022-05-21 DIAGNOSIS — J439 Emphysema, unspecified: Secondary | ICD-10-CM | POA: Diagnosis not present

## 2022-05-21 DIAGNOSIS — R69 Illness, unspecified: Secondary | ICD-10-CM | POA: Diagnosis not present

## 2022-05-21 DIAGNOSIS — R0789 Other chest pain: Secondary | ICD-10-CM | POA: Diagnosis not present

## 2022-05-21 DIAGNOSIS — R9431 Abnormal electrocardiogram [ECG] [EKG]: Secondary | ICD-10-CM | POA: Diagnosis not present

## 2022-05-21 MED ORDER — ALBUTEROL SULFATE HFA 108 (90 BASE) MCG/ACT IN AERS
2.0000 | INHALATION_SPRAY | Freq: Once | RESPIRATORY_TRACT | Status: AC
  Administered 2022-05-21: 2 via RESPIRATORY_TRACT
  Filled 2022-05-21: qty 6.7

## 2022-05-21 MED ORDER — DOXYCYCLINE HYCLATE 100 MG PO CAPS: 100.0000 mg | ORAL_CAPSULE | Freq: Two times a day (BID) | ORAL | 0 refills | Status: DC

## 2022-05-21 MED ORDER — ALBUTEROL SULFATE HFA 108 (90 BASE) MCG/ACT IN AERS: 2.0000 | INHALATION_SPRAY | Freq: Four times a day (QID) | RESPIRATORY_TRACT | 0 refills | Status: DC | PRN

## 2022-05-21 NOTE — ED Triage Notes (Signed)
Pt in from home with c/o bad cough since last night. States it is non-productive, is experiencing some sob with exertion and cp while coughing. Temp 98.2, sats 98% on RA

## 2022-05-21 NOTE — Discharge Instructions (Signed)
You were seen today for a cough.  This is likely a mild COPD exacerbation.  Use your inhaler as needed.  You were given a dose of steroids.  Your chest x-ray did not show any pneumonia.

## 2022-05-21 NOTE — ED Provider Notes (Signed)
[facility] EMERGENCY DEPARTMENT Provider Note   CSN: [id number] Arrival date & time: 05/21/22  0141     History  Chief Complaint  Patient presents with   Cough    Virginia Washington is a 66 y.o. female.  HPI     This is a 66 year old female with a history of COPD who presents with a cough.  Patient reports that she went to bed normally but woke up coughing.  Cough is nonproductive.  She has not had any fevers.  She states that she used to take inhalers but does not take anything daily.  She is a current smoker.  Patient reports some shortness of breath and chest discomfort only while coughing.  No exertional symptoms.  Has not noted any lower extremity swelling.  Home Medications Prior to Admission medications   Medication Sig Start Date End Date Taking? Authorizing Provider  amLODipine (NORVASC) 5 MG tablet Take 5 mg by mouth daily.    [provider]  atorvastatin (LIPITOR) 20 MG tablet Take 1 tablet (20 mg total) by mouth daily at 6 PM. 06/02/19   Barton Dubois, MD  baclofen (LIORESAL) 10 MG tablet Take 10 mg by mouth 2 (two) times daily. 03/01/22   [provider]  hydrochlorothiazide (HYDRODIURIL) 12.5 MG tablet Take 12.5 mg by mouth daily. 03/02/22   [provider]  linaclotide Rolan Lipa) 290 MCG CAPS capsule Take 1 capsule (290 mcg total) by mouth daily before breakfast. 04/20/22 04/20/23  [name], DO  meloxicam (MOBIC) 7.5 MG tablet Take 7.5 mg by mouth daily. 12/19/21   [provider]  pantoprazole (PROTONIX) 40 MG tablet Take 1 tablet (40 mg total) by mouth 2 (two) times daily. 02/17/21 04/27/22  Rancour, Annie Main, MD  simethicone (MYLICON) 244 MG chewable tablet Chew 1 tablet after each meal and at bedtime if needed Patient not taking: Reported on 03/20/2022 01/12/22   [name], DO  tiZANidine (ZANAFLEX) 4 MG tablet  10/28/21   [provider]      Allergies    Patient has no known allergies.    Review of Systems    Review of Systems  Respiratory:  Positive for cough and shortness of breath.   All other systems reviewed and are negative.  Physical Exam Updated Vital Signs BP 107/62   Pulse 78 Comment: ambulating  Temp 98.2 F (36.8 C) (Oral)   Resp 17   Wt 61.2 kg   SpO2 95%   BMI 24.69 kg/m  Physical Exam Vitals and nursing note reviewed.  Constitutional:      Appearance: She is well-developed. She is obese.     Comments: Chronically ill-appearing but nontoxic  HENT:     Head: Normocephalic and atraumatic.  Eyes:     Pupils: Pupils are equal, round, and reactive to light.  Cardiovascular:     Rate and Rhythm: Normal rate and regular rhythm.     Heart sounds: Normal heart sounds.  Pulmonary:     Effort: Pulmonary effort is normal. No respiratory distress.     Breath sounds: No wheezing.     Comments: Fair air movement, occasional expiratory wheeze with coughing Abdominal:     General: Bowel sounds are normal.     Palpations: Abdomen is soft.     Comments: Extensive abdominal scarring noted  Musculoskeletal:     Cervical back: Neck supple.     Right lower leg: No edema.     Left lower leg: No edema.  Skin:  General: Skin is warm and dry.  Neurological:     Mental Status: She is alert and oriented to person, place, and time.  Psychiatric:        Mood and Affect: Mood normal.    ED Results / Procedures / Treatments   Labs (all labs ordered are listed, but only abnormal results are displayed) Labs Reviewed - No data to display  EKG EKG Interpretation  Date/Time:  Sunday May 21 2022 01:59:00 EDT Ventricular Rate:  73 PR Interval:  179 QRS Duration: 94 QT Interval:  392 QTC Calculation: 432 R Axis:   81 Text Interpretation: Sinus rhythm Borderline right axis deviation Borderline T abnormalities, anterior leads Confirmed by Thayer Jew [phone]) on 05/21/2022 2:37:35 AM  Radiology DG Chest Portable 1 View  Result Date: 05/21/2022 CLINICAL DATA:  Cough EXAM:  PORTABLE CHEST 1 VIEW COMPARISON:  04/27/2022, 02/16/2021 FINDINGS: Emphysema and mild diffuse chronic interstitial lung opacity. No acute airspace disease, pleural effusion or pneumothorax. Stable cardiomediastinal silhouette with aortic atherosclerosis. IMPRESSION: No active disease.  Emphysema Electronically Signed   By: Donavan Foil M.D.   On: 05/21/2022 02:30    Procedures Procedures    Medications Ordered in ED Medications  albuterol (VENTOLIN HFA) 108 (90 Base) MCG/ACT inhaler 2 puff (2 puffs Inhalation Given 05/21/22 0207)    ED Course/ Medical Decision Making/ A&P                           Medical Decision Making Amount and/or Complexity of Data Reviewed Radiology: ordered.  Risk Prescription drug management.   This patient presents to the ED for concern of cough, this involves an extensive number of treatment options, and is a complaint that carries with it a high risk of complications and morbidity.  I considered the following differential and admission for this acute, potentially life threatening condition.  The differential diagnosis includes pneumonia, bronchitis, COPD exacerbation  MDM:    This is a 66 year old female who presents with cough.  She is overall nontoxic-appearing vital signs are reassuring.  O2 sats are reassuring.  She has good air movement.  She has an occasional wheeze on exam but is in no respiratory distress.  Highly suspect mild COPD exacerbation.  EKG shows no evidence of acute ischemia or arrhythmia.  Doubt ACS.  Chest x-ray without evidence of pneumonia or pneumothorax.  Patient was given albuterol and Decadron.  She was able to ambulate and maintain her pulse ox.  Will discharge with albuterol and given her history of COPD, will give prescription for doxycycline.  Will have her follow-up with her primary physician.  (Labs, imaging, consults)  Labs: I Ordered, and personally interpreted labs.  The pertinent results include: None  Imaging Studies  ordered: I ordered imaging studies including chest x-ray I independently visualized and interpreted imaging. I agree with the radiologist interpretation  Additional history obtained from chart review.  External records from outside source obtained and reviewed including prior evaluations  Cardiac Monitoring: The patient was maintained on a cardiac monitor.  I personally viewed and interpreted the cardiac monitored which showed an underlying rhythm of: Sinus rhythm  Reevaluation: After the interventions noted above, I reevaluated the patient and found that they have :improved  Social Determinants of Health: Lives independently  Disposition: Discharge  Co morbidities that complicate the patient evaluation  Past Medical History:  Diagnosis Date   Chronic back pain    Constipation    Constipation  COPD (chronic obstructive pulmonary disease) (HCC)    DVT (deep venous thrombosis) (HCC)    GERD (gastroesophageal reflux disease)    Headache    History of kidney stones    Hyperlipidemia    Hypertension      Medicines Meds ordered this encounter  Medications   albuterol (VENTOLIN HFA) 108 (90 Base) MCG/ACT inhaler 2 puff   doxycycline (VIBRAMYCIN) 100 MG capsule    Sig: Take 1 capsule (100 mg total) by mouth 2 (two) times daily.    Dispense:  20 capsule    Refill:  0   albuterol (VENTOLIN HFA) 108 (90 Base) MCG/ACT inhaler    Sig: Inhale 2 puffs into the lungs every 6 (six) hours as needed for wheezing or shortness of breath.    Dispense:  1 each    Refill:  0    I have reviewed the patients home medicines and have made adjustments as needed  Problem List / ED Course: Problem List Items Addressed This Visit   None Visit Diagnoses     COPD with exacerbation ([place])    -  Primary   Relevant Medications   albuterol (VENTOLIN HFA) 108 (90 Base) MCG/ACT inhaler                   Final Clinical Impression(s) / ED Diagnoses Final diagnoses:  None    Rx /  DC Orders ED Discharge Orders     None         Darnesha Diloreto, Barbette Hair, MD 05/21/22 [phone]

## 2022-05-21 NOTE — ED Notes (Signed)
Patient ambulated around the ED. SpO2 between 94-96.

## 2022-05-31 ENCOUNTER — Other Ambulatory Visit: Payer: Self-pay | Admitting: *Deleted

## 2022-05-31 NOTE — Patient Outreach (Signed)
[facility]) Care Management  05/31/2022  HELMA ARGYLE 1956-02-18 [id number]   Nurse Health Coach closed patient's case and sent patient a case closed letter due to [facility] has discontinued the educational mailing program.  Plan:  RN sent letter to patient Six month educational mailing closed  [facility] Management [phone]

## 2022-06-10 DIAGNOSIS — I739 Peripheral vascular disease, unspecified: Secondary | ICD-10-CM | POA: Diagnosis not present

## 2022-06-10 DIAGNOSIS — I10 Essential (primary) hypertension: Secondary | ICD-10-CM | POA: Diagnosis not present

## 2022-06-21 NOTE — H&P (View-Only) (Signed)
Referring Provider: Carrolyn Meiers* Primary Care Physician:  Carrolyn Meiers, MD Primary GI Physician: Dr. Abbey Chatters  Chief Complaint  Patient presents with   Abdominal Pain    Pains in the top of stomach. Feels a knot at the top of stomach.     HPI:   Virginia Washington is a 66 y.o. female with GI history of partial SBO in 2020 managed conservatively, constipation, GERD, dysphagia, chronic abdominal pain, and adenomatous colon polyps with surveillance due between 2024-2026. EGD October 2021 small hiatal hernia, grade B reflux esophagitis, benign-appearing esophageal stenosis dilated, gastritis biopsied- mild nonspecific reactive gastropathy, negative for H. pylori.  She is presenting today for follow-up with chief complaint of epigastric abdominal pain and dysphagia.   Last seen in our office 01/12/2022.  GERD was well controlled on pantoprazole once daily.  Prior dysphagia resolved.  Constipation well controlled on Linzess.  Main complaint was abdominal bloating, primarily postprandially, with abdominal distention and occasional pain.  Recommended starting simethicone after each meal and at bedtime, and start daily probiotic.  Today: Feels like foods are getting hung in her esophagus again. Has epigastric abdominal pain as well. Feels like a knot in the epigastric area.  Symptoms started in April.  Abdominal pain can occur with or without eating, but is worsened by meals.  No specific trigger.  Associated with abdominal bloating/distension. Associated nausea without vomiting. Ran out of pantoprazole a couple days ago. Was taking daily. No heartburn symptoms. Taking meloxicam BID. No other NSAIDs.   She was evaluated in the emergency room in April for the same pain she is experiencing now.  Labs and CT without any acute findings.  She reports no worsening of her symptoms since that time, just persistent.  Her abdominal bloating is not daily. Seems to be related to constipation.  Improves when she takes Linzess. Has Bms 2-3 times a week. Taking Linzess a couple times a week when her stomach gets bloated. Linzess 290 mcg is too strong to take daily.   No brbpr or melena. Weight has been stable.   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  Normal right aortoiliac vessels.  Three-vessel runoff bilaterally.  (Sluggish flow of the left side due to occlusion.   ABDOMINAL HYSTERECTOMY     BALLOON DILATION N/A 10/05/2020   Procedure: BALLOON DILATION;  Surgeon: [name], DO;  [facility]: [facility];  Service: Endoscopy;  Laterality: N/A;   BIOPSY  10/05/2020   Procedure: BIOPSY;  Surgeon: [name], DO;  Location: [facility];  Service: Endoscopy;;   CHOLECYSTECTOMY     COLONOSCOPY  2007   Dr. Laural Golden: small internal hemorrhoids, otherwise normal. exam could be compromised due to quality of prep   COLONOSCOPY N/A 02/15/2018   Dr. Oneida Alar: 2 simple adenomas, external and internal hemorrhoids. Next colonoscopy 2024-2026  ESOPHAGOGASTRODUODENOSCOPY N/A 05/09/2018   Procedure: ESOPHAGOGASTRODUODENOSCOPY (EGD);  Surgeon: Danie Binder, MD; web in the distal esophagus s/p dilated, mild gastritis biopsied, normal examined duodenum. Pathology with gastritis, negative for H. Pylori.     ESOPHAGOGASTRODUODENOSCOPY (EGD) WITH PROPOFOL N/A 10/05/2020    Surgeon: [name], DO; small hiatal hernia, grade B reflux esophagitis, benign-appearing esophageal stenosis dilated, gastritis biopsied- mild nonspecific reactive gastropathy, negative for H. pylori.   FEMORAL-FEMORAL BYPASS GRAFT Bilateral 10/21/2018   Procedure: RIGHT TO LEFT FEMORAL ARTERY BYPASS GRAFT;  Surgeon: Elam Dutch, MD;  Location: [facility] OR;  Service: Vascular;  Laterality: Bilateral;   LOWER EXTREMITY ANGIOGRAPHY N/A 09/27/2018   Procedure: LOWER EXTREMITY ANGIOGRAPHY;  Surgeon: Elam Dutch, MD;  Location: [place] [facility];  Service: Cardiovascular;  Laterality: N/A;   NM MYOVIEW LTD  12/2014   [facility]: Normal Myoview.  Nonischemic.   TRANSTHORACIC ECHOCARDIOGRAM  12/2011   EF 55 to 60%.  No regional wall motion normality.  GRII DD.  No significant valve disease.    Current Outpatient Medications  Medication Sig Dispense Refill   albuterol (VENTOLIN HFA) 108 (90 Base) MCG/ACT inhaler Inhale 2 puffs into the lungs every 6 (six) hours as needed for wheezing or shortness of breath. 1 each 0   amLODipine (NORVASC) 5 MG tablet Take 5 mg by mouth daily.     hydrochlorothiazide (HYDRODIURIL) 12.5 MG tablet Take 12.5 mg by mouth daily.     linaclotide (LINZESS) 290 MCG CAPS capsule Take 1 capsule (290 mcg total) by mouth daily before breakfast. 90 capsule 3   meloxicam (MOBIC) 7.5 MG tablet Take 7.5 mg by mouth daily.     atorvastatin (LIPITOR) 20 MG tablet Take 1 tablet (20 mg total) by mouth daily at 6 PM. (Patient not taking: Reported on 06/22/2022) 30 tablet 1   baclofen (LIORESAL) 10 MG tablet Take 10 mg by mouth 2 (two) times daily. (Patient not taking: Reported on 06/22/2022)     pantoprazole (PROTONIX) 40 MG tablet Take 1 tablet (40 mg total) by mouth 2 (two) times daily before a meal. 60 tablet 3   simethicone (MYLICON) 811 MG chewable tablet Chew 1 tablet after each meal and at bedtime if needed (Patient not taking: Reported on 03/20/2022) 120 tablet 5    tiZANidine (ZANAFLEX) 4 MG tablet  (Patient not taking: Reported on 03/20/2022)     No current facility-administered medications for this visit.    Allergies as of 06/22/2022   (No Known Allergies)    Family History  Problem Relation Age of Onset   Hypertension Mother    Peripheral Artery Disease Brother    Colon cancer Neg Hx    Colon polyps Neg Hx     Social History   Socioeconomic History   Marital status: Widowed    Spouse name: Not on file   Number of children: Not on file   Years of education: Not on file   Highest education level: Not on file  Occupational History   Occupation: unemployed    Comment: Draws pension from her husband's life insurance policy  Tobacco Use   Smoking status: Every Day    Packs/day: 0.50    Years: 40.00    Total pack years: 20.00    Types: Cigarettes   Smokeless tobacco: Never  Vaping Use   Vaping Use: Never used  Substance and Sexual Activity   Alcohol use: No    Alcohol/week: 0.0 standard drinks of alcohol   Drug  use: No   Sexual activity: Not Currently  Other Topics Concern   Not on file  Social History Narrative   Widowed: GETS HUSBAND'S PENSION.  mother 63, grandmother of 58 with one great grandchild.  She currently lives alone.  She completed ninth grade.  Smokes a pack a day for 42 years.  Tries to walks for about 5 minutes at x7 days a week with plans to help revascularization.       HOBBIES: PUZZLE BOOKS.   Social Determinants of Health   Financial Resource Strain: Not on file  Food Insecurity: Not on file  Transportation Needs: Not on file  Physical Activity: Not on file  Stress: Not on file  Social Connections: Not on file    Review of Systems: Gen: Denies fever, chills, cold or flu like symptoms, pre-syncope, or syncope.  CV: Denies chest pain, palpitations. Resp: Denies dyspnea, cough.  GI: See HPI Heme: See HPI  Physical Exam: BP 115/68   Pulse 73   Temp 98.6 F (37 C) (Temporal)   Ht '5\' 2"'$  (1.575 m)    Wt 134 lb 9.6 oz (61.1 kg)   BMI 24.62 kg/m  General:   Alert and oriented. No distress noted. Pleasant and cooperative.  Head:  Normocephalic and atraumatic. Eyes:  Conjuctiva clear without scleral icterus. Heart:  S1, S2 present without murmurs appreciated. Lungs:  Clear to auscultation bilaterally. No wheezes, rales, or rhonchi. No distress.  Abdomen:  +BS, full but soft. Mild epigastric abdominal TTP. No rebound or guarding. No HSM or masses noted. Msk:  Symmetrical without gross deformities. Normal posture. Extremities:  Without edema. Neurologic:  Alert and  oriented x4 Psych:  Normal mood and affect.    Assessment:  66 y.o. female with GI history of partial SBO in 2020 managed conservatively, constipation, GERD, dysphagia, chronic abdominal pain, and adenomatous colon polyps with surveillance due between 2024-2026, presenting today for follow-up with chief complaint of epigastric abdominal pain and dysphagia.   Epigastric abdominal pain:  New onset epigastric abdominal pain worsened postprandially for the last 2-3 months. Associated abdominal bloating and nausea without vomiting.  Denies heartburn symptoms or melena.  She is supposed be taking pantoprazole daily though ran out a couple days ago.  She is chronically taking meloxicam twice daily.  Known history of grade B reflux esophagitis and gastritis in October 2021.  With chronic NSAID use, she is at risk for PUD.  She is evaluated in the emergency room for similar symptoms in April.  Labs and CT at that time without any significant findings.  We will plan to proceed with EGD in the near future for further evaluation with differentials including gastritis, duodenitis, PUD, H. pylori, less likely malignancy.  We will also go ahead and increase PPI to twice daily and update labs as well.  Dysphagia:  History of dysphagia with benign-appearing esophageal stenosis s/p dilation in October 2021.  Previously reported symptoms resolved,  but now with recurrent symptoms since April.  Query recurrent esophageal stenosis versus other such as Schatzki's ring or less likely malignancy..  We will arrange repeat EGD with possible dilation in the near future.  Constipation:  Chronic.  Not adequately managed taking Linzess 290 mcg a couple times a week as daily dosing is too strong.  She has some associated abdominal bloating that improves when she takes Linzess and bowels move better.  No alarm symptoms and colonoscopy up-to-date.  We will try her on Linzess 145 mcg daily.  Samples provided.  She will let me know how she is doing in 1 week.  Plan:  CBC, CMP, lipase Proceed with upper endoscopy with propofol by Dr. Abbey Chatters in near future. The risks, benefits, and alternatives have been discussed with the patient in detail. The patient states understanding and desires to proceed. ASA 3 Increase Protonix to 40 mg twice daily.  New prescription sent to pharmacy. Stop daily use of meloxicam and only use if absolutely necessary.  Recommended using Tylenol first for pain.  Number than 3000 mg of Tylenol per 24 hours. Stop Linzess 290 mcg and start Linzess 145 mcg daily.  Samples provided.  Requested 1 week progress report. Follow-up after EGD.   Aliene Altes, PA-C [facility] 06/22/2022

## 2022-06-21 NOTE — Progress Notes (Unsigned)
Referring Provider: Carrolyn Meiers* Primary Care Physician:  Carrolyn Meiers, MD Primary GI Physician: Dr. Abbey Chatters  Chief Complaint  Patient presents with   Abdominal Pain    Pains in the top of stomach. Feels a knot at the top of stomach.     HPI:   Virginia Washington is a 66 y.o. female with GI history of partial SBO in 2020 managed conservatively, constipation, GERD, dysphagia, chronic abdominal pain, and adenomatous colon polyps with surveillance due between 2024-2026. EGD October 2021 small hiatal hernia, grade B reflux esophagitis, benign-appearing esophageal stenosis dilated, gastritis biopsied- mild nonspecific reactive gastropathy, negative for H. pylori.  She is presenting today for follow-up with chief complaint of epigastric abdominal pain and dysphagia.   Last seen in our office 01/12/2022.  GERD was well controlled on pantoprazole once daily.  Prior dysphagia resolved.  Constipation well controlled on Linzess.  Main complaint was abdominal bloating, primarily postprandially, with abdominal distention and occasional pain.  Recommended starting simethicone after each meal and at bedtime, and start daily probiotic.  Today: Feels like foods are getting hung in her esophagus again. Has epigastric abdominal pain as well. Feels like a knot in the epigastric area.  Symptoms started in April.  Abdominal pain can occur with or without eating, but is worsened by meals.  No specific trigger.  Associated with abdominal bloating/distension. Associated nausea without vomiting. Ran out of pantoprazole a couple days ago. Was taking daily. No heartburn symptoms. Taking meloxicam BID. No other NSAIDs.   She was evaluated in the emergency room in April for the same pain she is experiencing now.  Labs and CT without any acute findings.  She reports no worsening of her symptoms since that time, just persistent.  Her abdominal bloating is not daily. Seems to be related to constipation.  Improves when she takes Linzess. Has Bms 2-3 times a week. Taking Linzess a couple times a week when her stomach gets bloated. Linzess 290 mcg is too strong to take daily.   No brbpr or melena. Weight has been stable.   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  Normal right aortoiliac vessels.  Three-vessel runoff bilaterally.  (Sluggish flow of the left side due to occlusion.   ABDOMINAL HYSTERECTOMY     BALLOON DILATION N/A 10/05/2020   Procedure: BALLOON DILATION;  Surgeon: [name], DO;  [facility];  Service: Endoscopy;  Laterality: N/A;   BIOPSY  10/05/2020   Procedure: BIOPSY;  Surgeon: [name], DO;  [facility];  Service: Endoscopy;;   CHOLECYSTECTOMY     COLONOSCOPY  2007   Dr. Laural Golden: small internal hemorrhoids, otherwise normal. exam could be compromised due to quality of prep   COLONOSCOPY N/A 02/15/2018   Dr. Oneida Alar: 2 simple adenomas, external and internal hemorrhoids. Next colonoscopy 2024-2026  ESOPHAGOGASTRODUODENOSCOPY N/A 05/09/2018   Procedure: ESOPHAGOGASTRODUODENOSCOPY (EGD);  Surgeon: Danie Binder, MD; web in the distal esophagus s/p dilated, mild gastritis biopsied, normal examined duodenum. Pathology with gastritis, negative for H. Pylori.     ESOPHAGOGASTRODUODENOSCOPY (EGD) WITH PROPOFOL N/A 10/05/2020    Surgeon: [name], DO; small hiatal hernia, grade B reflux esophagitis, benign-appearing esophageal stenosis dilated, gastritis biopsied- mild nonspecific reactive gastropathy, negative for H. pylori.   FEMORAL-FEMORAL BYPASS GRAFT Bilateral 10/21/2018   Procedure: RIGHT TO LEFT FEMORAL ARTERY BYPASS GRAFT;  Surgeon: Elam Dutch, MD;  Location: [facility];  Service: Vascular;  Laterality: Bilateral;   LOWER EXTREMITY ANGIOGRAPHY N/A 09/27/2018   Procedure: LOWER EXTREMITY ANGIOGRAPHY;  Surgeon: Elam Dutch, MD;  Location: [place] [facility];  Service: Cardiovascular;  Laterality: N/A;   NM MYOVIEW LTD  12/2014   [facility]: Normal Myoview.  Nonischemic.   TRANSTHORACIC ECHOCARDIOGRAM  12/2011   EF 55 to 60%.  No regional wall motion normality.  GRII DD.  No significant valve disease.    Current Outpatient Medications  Medication Sig Dispense Refill   albuterol (VENTOLIN HFA) 108 (90 Base) MCG/ACT inhaler Inhale 2 puffs into the lungs every 6 (six) hours as needed for wheezing or shortness of breath. 1 each 0   amLODipine (NORVASC) 5 MG tablet Take 5 mg by mouth daily.     hydrochlorothiazide (HYDRODIURIL) 12.5 MG tablet Take 12.5 mg by mouth daily.     linaclotide (LINZESS) 290 MCG CAPS capsule Take 1 capsule (290 mcg total) by mouth daily before breakfast. 90 capsule 3   meloxicam (MOBIC) 7.5 MG tablet Take 7.5 mg by mouth daily.     atorvastatin (LIPITOR) 20 MG tablet Take 1 tablet (20 mg total) by mouth daily at 6 PM. (Patient not taking: Reported on 06/22/2022) 30 tablet 1   baclofen (LIORESAL) 10 MG tablet Take 10 mg by mouth 2 (two) times daily. (Patient not taking: Reported on 06/22/2022)     pantoprazole (PROTONIX) 40 MG tablet Take 1 tablet (40 mg total) by mouth 2 (two) times daily before a meal. 60 tablet 3   simethicone (MYLICON) 182 MG chewable tablet Chew 1 tablet after each meal and at bedtime if needed (Patient not taking: Reported on 03/20/2022) 120 tablet 5    tiZANidine (ZANAFLEX) 4 MG tablet  (Patient not taking: Reported on 03/20/2022)     No current facility-administered medications for this visit.    Allergies as of 06/22/2022   (No Known Allergies)    Family History  Problem Relation Age of Onset   Hypertension Mother    Peripheral Artery Disease Brother    Colon cancer Neg Hx    Colon polyps Neg Hx     Social History   Socioeconomic History   Marital status: Widowed    Spouse name: Not on file   Number of children: Not on file   Years of education: Not on file   Highest education level: Not on file  Occupational History   Occupation: unemployed    Comment: Draws pension from her husband's life insurance policy  Tobacco Use   Smoking status: Every Day    Packs/day: 0.50    Years: 40.00    Total pack years: 20.00    Types: Cigarettes   Smokeless tobacco: Never  Vaping Use   Vaping Use: Never used  Substance and Sexual Activity   Alcohol use: No    Alcohol/week: 0.0 standard drinks of alcohol   Drug  use: No   Sexual activity: Not Currently  Other Topics Concern   Not on file  Social History Narrative   Widowed: GETS HUSBAND'S PENSION.  mother 72, grandmother of 79 with one great grandchild.  She currently lives alone.  She completed ninth grade.  Smokes a pack a day for 42 years.  Tries to walks for about 5 minutes at x7 days a week with plans to help revascularization.       HOBBIES: PUZZLE BOOKS.   Social Determinants of Health   Financial Resource Strain: Not on file  Food Insecurity: Not on file  Transportation Needs: Not on file  Physical Activity: Not on file  Stress: Not on file  Social Connections: Not on file    Review of Systems: Gen: Denies fever, chills, cold or flu like symptoms, pre-syncope, or syncope.  CV: Denies chest pain, palpitations. Resp: Denies dyspnea, cough.  GI: See HPI Heme: See HPI  Physical Exam: BP 115/68   Pulse 73   Temp 98.6 F (37 C) (Temporal)   Ht '5\' 2"'$  (1.575 m)    Wt 134 lb 9.6 oz (61.1 kg)   BMI 24.62 kg/m  General:   Alert and oriented. No distress noted. Pleasant and cooperative.  Head:  Normocephalic and atraumatic. Eyes:  Conjuctiva clear without scleral icterus. Heart:  S1, S2 present without murmurs appreciated. Lungs:  Clear to auscultation bilaterally. No wheezes, rales, or rhonchi. No distress.  Abdomen:  +BS, full but soft. Mild epigastric abdominal TTP. No rebound or guarding. No HSM or masses noted. Msk:  Symmetrical without gross deformities. Normal posture. Extremities:  Without edema. Neurologic:  Alert and  oriented x4 Psych:  Normal mood and affect.    Assessment:  66 y.o. female with GI history of partial SBO in 2020 managed conservatively, constipation, GERD, dysphagia, chronic abdominal pain, and adenomatous colon polyps with surveillance due between 2024-2026, presenting today for follow-up with chief complaint of epigastric abdominal pain and dysphagia.   Epigastric abdominal pain:  New onset epigastric abdominal pain worsened postprandially for the last 2-3 months. Associated abdominal bloating and nausea without vomiting.  Denies heartburn symptoms or melena.  She is supposed be taking pantoprazole daily though ran out a couple days ago.  She is chronically taking meloxicam twice daily.  Known history of grade B reflux esophagitis and gastritis in October 2021.  With chronic NSAID use, she is at risk for PUD.  She is evaluated in the emergency room for similar symptoms in April.  Labs and CT at that time without any significant findings.  We will plan to proceed with EGD in the near future for further evaluation with differentials including gastritis, duodenitis, PUD, H. pylori, less likely malignancy.  We will also go ahead and increase PPI to twice daily and update labs as well.  Dysphagia:  History of dysphagia with benign-appearing esophageal stenosis s/p dilation in October 2021.  Previously reported symptoms resolved,  but now with recurrent symptoms since April.  Query recurrent esophageal stenosis versus other such as Schatzki's ring or less likely malignancy..  We will arrange repeat EGD with possible dilation in the near future.  Constipation:  Chronic.  Not adequately managed taking Linzess 290 mcg a couple times a week as daily dosing is too strong.  She has some associated abdominal bloating that improves when she takes Linzess and bowels move better.  No alarm symptoms and colonoscopy up-to-date.  We will try her on Linzess 145 mcg daily.  Samples provided.  She will let me know how she is doing in 1 week.  Plan:  CBC, CMP, lipase Proceed with upper endoscopy with propofol by Dr. Abbey Chatters in near future. The risks, benefits, and alternatives have been discussed with the patient in detail. The patient states understanding and desires to proceed. ASA 3 Increase Protonix to 40 mg twice daily.  New prescription sent to pharmacy. Stop daily use of meloxicam and only use if absolutely necessary.  Recommended using Tylenol first for pain.  Number than 3000 mg of Tylenol per 24 hours. Stop Linzess 290 mcg and start Linzess 145 mcg daily.  Samples provided.  Requested 1 week progress report. Follow-up after EGD.   Aliene Altes, PA-C [facility] 06/22/2022

## 2022-06-22 ENCOUNTER — Encounter: Payer: Self-pay | Admitting: Gastroenterology

## 2022-06-22 ENCOUNTER — Ambulatory Visit (INDEPENDENT_AMBULATORY_CARE_PROVIDER_SITE_OTHER): Payer: Medicare HMO | Admitting: Gastroenterology

## 2022-06-22 VITALS — BP 115/68 | HR 73 | Temp 98.6°F | Ht 62.0 in | Wt 134.6 lb

## 2022-06-22 DIAGNOSIS — K5900 Constipation, unspecified: Secondary | ICD-10-CM

## 2022-06-22 DIAGNOSIS — R1013 Epigastric pain: Secondary | ICD-10-CM

## 2022-06-22 DIAGNOSIS — K219 Gastro-esophageal reflux disease without esophagitis: Secondary | ICD-10-CM | POA: Diagnosis not present

## 2022-06-22 DIAGNOSIS — R1310 Dysphagia, unspecified: Secondary | ICD-10-CM | POA: Diagnosis not present

## 2022-06-22 MED ORDER — PANTOPRAZOLE SODIUM 40 MG PO TBEC: 40.0000 mg | DELAYED_RELEASE_TABLET | Freq: Two times a day (BID) | ORAL | 3 refills | Status: DC

## 2022-06-22 NOTE — Patient Instructions (Addendum)
Have labs completed at [facility].   We will arrange for you to have an upper endoscopy with possible stretching of your esophagus in the near future with Dr. Abbey Chatters.  Increase pantoprazole to 40 mg twice daily 30 minutes before breakfast and dinner.  Stop taking meloxicam daily and only use if you absolutely have to for joint pain.  Recommend you try Tylenol first for pain.  No more than 3000 mg of Tylenol per 24 hours.  For constipation, stop Linzess 290 mcg and start Linzess 145 mcg daily.  We are providing you with samples today.  Please call a progress report in 1 week to let me know how you are doing.  If this works well, I will send in a prescription for you.  We will follow-up with you after your upper endoscopy.  Do not hesitate to call if you have any questions or concerns prior to your next visit.  It was good to see you again today!  Aliene Altes, PA-C [facility]

## 2022-06-23 ENCOUNTER — Other Ambulatory Visit: Payer: Self-pay

## 2022-06-23 ENCOUNTER — Telehealth: Payer: Self-pay | Admitting: *Deleted

## 2022-06-23 ENCOUNTER — Encounter: Payer: Self-pay | Admitting: *Deleted

## 2022-06-23 ENCOUNTER — Emergency Department (HOSPITAL_COMMUNITY): Payer: Medicare HMO

## 2022-06-23 ENCOUNTER — Encounter (HOSPITAL_COMMUNITY): Payer: Self-pay | Admitting: Emergency Medicine

## 2022-06-23 ENCOUNTER — Emergency Department (HOSPITAL_COMMUNITY)
Admission: EM | Admit: 2022-06-23 | Discharge: 2022-06-23 | Disposition: A | Discharge status: Home / Self Care | Payer: Medicare HMO | Attending: Student | Admitting: Student

## 2022-06-23 DIAGNOSIS — J449 Chronic obstructive pulmonary disease, unspecified: Secondary | ICD-10-CM | POA: Insufficient documentation

## 2022-06-23 DIAGNOSIS — Z79899 Other long term (current) drug therapy: Secondary | ICD-10-CM | POA: Diagnosis not present

## 2022-06-23 DIAGNOSIS — R69 Illness, unspecified: Secondary | ICD-10-CM | POA: Diagnosis not present

## 2022-06-23 DIAGNOSIS — I10 Essential (primary) hypertension: Secondary | ICD-10-CM | POA: Diagnosis not present

## 2022-06-23 DIAGNOSIS — F17210 Nicotine dependence, cigarettes, uncomplicated: Secondary | ICD-10-CM | POA: Insufficient documentation

## 2022-06-23 DIAGNOSIS — R109 Unspecified abdominal pain: Secondary | ICD-10-CM | POA: Diagnosis not present

## 2022-06-23 DIAGNOSIS — I700 Atherosclerosis of aorta: Secondary | ICD-10-CM | POA: Diagnosis not present

## 2022-06-23 DIAGNOSIS — R1013 Epigastric pain: Secondary | ICD-10-CM | POA: Insufficient documentation

## 2022-06-23 LAB — CBC
HCT: 45.6 % (ref 36.0–46.0)
Hemoglobin: 14.8 g/dL (ref 12.0–15.0)
MCH: 28.6 pg (ref 26.0–34.0)
MCHC: 32.5 g/dL (ref 30.0–36.0)
MCV: 88.2 fL (ref 80.0–100.0)
Platelets: 239 10*3/uL (ref 150–400)
RBC: 5.17 MIL/uL — ABNORMAL HIGH (ref 3.87–5.11)
RDW: 15.1 % (ref 11.5–15.5)
WBC: 11.5 10*3/uL — ABNORMAL HIGH (ref 4.0–10.5)
nRBC: 0 % (ref 0.0–0.2)

## 2022-06-23 LAB — COMPREHENSIVE METABOLIC PANEL
ALT: 13 U/L (ref 0–44)
AST: 14 U/L — ABNORMAL LOW (ref 15–41)
Albumin: 3.9 g/dL (ref 3.5–5.0)
Alkaline Phosphatase: 111 U/L (ref 38–126)
Anion gap: 8 (ref 5–15)
BUN: 10 mg/dL (ref 8–23)
CO2: 22 mmol/L (ref 22–32)
Calcium: 8.7 mg/dL — ABNORMAL LOW (ref 8.9–10.3)
Chloride: 108 mmol/L (ref 98–111)
Creatinine, Ser: 0.87 mg/dL (ref 0.44–1.00)
GFR, Estimated: >60 mL/min (ref >60)
Glucose, Bld: 88 mg/dL (ref 70–99)
Potassium: 3.4 mmol/L — ABNORMAL LOW (ref 3.5–5.1)
Sodium: 138 mmol/L (ref 135–145)
Total Bilirubin: 0.6 mg/dL (ref 0.3–1.2)
Total Protein: 7.4 g/dL (ref 6.5–8.1)

## 2022-06-23 LAB — LIPASE, BLOOD: Lipase: 51 U/L (ref 11–51)

## 2022-06-23 MED ORDER — MAALOX MAX 400-400-40 MG/5ML PO SUSP: 15.0000 mL | Freq: Four times a day (QID) | ORAL | 0 refills | Status: AC | PRN

## 2022-06-23 MED ORDER — DICYCLOMINE HCL 20 MG PO TABS: 20.0000 mg | ORAL_TABLET | Freq: Two times a day (BID) | ORAL | 0 refills | Status: DC

## 2022-06-23 MED ORDER — ALUM & MAG HYDROXIDE-SIMETH 200-200-20 MG/5ML PO SUSP
30.0000 mL | Freq: Once | ORAL | Status: AC
  Administered 2022-06-23: 30 mL via ORAL
  Filled 2022-06-23: qty 30

## 2022-06-23 MED ORDER — LIDOCAINE VISCOUS HCL 2 % MT SOLN
15.0000 mL | Freq: Once | OROMUCOSAL | Status: AC
  Administered 2022-06-23: 15 mL via OROMUCOSAL
  Filled 2022-06-23: qty 15

## 2022-06-23 MED ORDER — IOHEXOL 300 MG/ML  SOLN
80.0000 mL | Freq: Once | INTRAMUSCULAR | Status: AC | PRN
  Administered 2022-06-23: 80 mL via INTRAVENOUS

## 2022-06-23 NOTE — ED Triage Notes (Signed)
Pt c/o upper abd pain since yesterday. Seen pcp yesterday for same. Denies n/v/d. Mm dry

## 2022-06-23 NOTE — ED Provider Notes (Signed)
[facility] EMERGENCY DEPARTMENT Provider Note  CSN: [id number] Arrival date & time: 06/23/22 1844  Chief Complaint(s) Abdominal Pain  HPI Virginia Washington is a 66 y.o. female with PMH COPD, constipation, HTN, HLD, IBS who presents emergency department for evaluation of abdominal pain and bloating.  Patient with known grade B reflux esophagitis, gastritis, and was seen by GI yesterday and scheduled for a scope on 7/31.  She states that her pain has gradually worsened since his appointment and she come to the emergency department for worsening epigastric pain.  Denies chest pain, shortness of breath, nausea, vomiting, diarrhea or other systemic symptoms.  Patient states that she pooped yesterday.   Past Medical History Past Medical History:  Diagnosis Date   Chronic back pain    Constipation    Constipation    COPD (chronic obstructive pulmonary disease) (HCC)    DVT (deep venous thrombosis) (HCC)    GERD (gastroesophageal reflux disease)    Headache    History of kidney stones    Hyperlipidemia    Hypertension    Patient Active Problem List   Diagnosis Date Noted   IBS (irritable bowel syndrome) 01/12/2022   Abdominal pain, epigastric 08/11/2020   Early satiety 08/11/2020   Intractable abdominal pain 05/30/2019   Partial bowel obstruction ([place]) 05/30/2019   Preop cardiovascular exam 09/23/2018   PAD (peripheral artery disease) ([place]) -left iliac occlusion 09/23/2018   Dysphagia    SBO (small bowel obstruction) ([place]) 04/29/2018   GERD (gastroesophageal reflux disease) 04/28/2018   Hypokalemia 04/28/2018   Pulmonary nodule 04/28/2018   Hypernatremia 04/28/2018   Enteritis 04/28/2018   Encounter for screening colonoscopy 01/16/2018   Abdominal pain 11/16/2017   Constipation 11/16/2017   Partial small bowel obstruction ([place]) 08/16/2016   Hyperlipidemia 08/16/2016   Chest pain 12/12/2014   Hypertension 12/12/2014   Tobacco abuse 10/29/2011   Home Medication(s) Prior to  Admission medications   Medication Sig Start Date End Date Taking? Authorizing Provider  amLODipine (NORVASC) 5 MG tablet Take 5 mg by mouth daily.   Yes [provider]  hydrochlorothiazide (HYDRODIURIL) 12.5 MG tablet Take 12.5 mg by mouth daily. 03/02/22  Yes [provider]  linaclotide Rolan Lipa) 290 MCG CAPS capsule Take 1 capsule (290 mcg total) by mouth daily before breakfast. 04/20/22 04/20/23 Yes Carver, Elon Alas, DO  simethicone (MYLICON) 035 MG chewable tablet Chew 1 tablet after each meal and at bedtime if needed 01/12/22  Yes Hurshel Keys K, DO  albuterol (VENTOLIN HFA) 108 (90 Base) MCG/ACT inhaler Inhale 2 puffs into the lungs every 6 (six) hours as needed for wheezing or shortness of breath. 05/21/22   Horton, Barbette Hair, MD  pantoprazole (PROTONIX) 40 MG tablet Take 1 tablet (40 mg total) by mouth 2 (two) times daily before a meal. 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  Normal right aortoiliac vessels.  Three-vessel runoff bilaterally.  (Sluggish flow of the left side due to occlusion.   ABDOMINAL HYSTERECTOMY     BALLOON DILATION N/A 10/05/2020   Procedure: BALLOON DILATION;  Surgeon: [name], DO;  [facility];  Service: Endoscopy;  Laterality: N/A;   BIOPSY  10/05/2020   Procedure: BIOPSY;  Surgeon: [name], DO;  [facility];  Service: Endoscopy;;   CHOLECYSTECTOMY     COLONOSCOPY  2007    Dr. Laural Golden: small internal hemorrhoids, otherwise normal. exam could be compromised due to quality of prep   COLONOSCOPY N/A 02/15/2018   Dr. Oneida Alar: 2 simple adenomas, external and internal hemorrhoids. Next colonoscopy 2024-2026   ESOPHAGOGASTRODUODENOSCOPY N/A 05/09/2018   Procedure: ESOPHAGOGASTRODUODENOSCOPY (EGD);  Surgeon: Danie Binder, MD; web in the distal esophagus s/p dilated, mild gastritis biopsied, normal examined duodenum. Pathology with gastritis, negative for H. Pylori.     ESOPHAGOGASTRODUODENOSCOPY (EGD) WITH PROPOFOL N/A 10/05/2020   Surgeon: [name], DO; small hiatal hernia, grade B reflux esophagitis, benign-appearing esophageal stenosis dilated, gastritis biopsied- mild nonspecific reactive gastropathy, negative for H. pylori.   FEMORAL-FEMORAL BYPASS GRAFT Bilateral 10/21/2018   Procedure: RIGHT TO LEFT FEMORAL ARTERY BYPASS GRAFT;  Surgeon: Elam Dutch, MD;  Location: [facility];  Service: Vascular;  Laterality: Bilateral;   LOWER EXTREMITY ANGIOGRAPHY N/A 09/27/2018   Procedure: LOWER EXTREMITY ANGIOGRAPHY;  Surgeon: Elam Dutch, MD;  Location: [place] [facility];  Service: Cardiovascular;  Laterality: N/A;   NM MYOVIEW LTD  12/2014   [facility]: Normal Myoview.  Nonischemic.   TRANSTHORACIC ECHOCARDIOGRAM  12/2011   EF 55 to 60%.  No regional wall motion normality.  GRII DD.  No significant valve disease.   Family History Family History  Problem Relation Age of Onset   Hypertension Mother    Peripheral Artery Disease Brother    Colon cancer Neg Hx    Colon polyps Neg Hx     Social History Social History   Tobacco Use   Smoking status: Every Day    Packs/day: 0.50    Years: 40.00    Total pack years: 20.00    Types: Cigarettes   Smokeless tobacco: Never  Vaping Use   Vaping Use: Never used  Substance Use Topics   Alcohol use: No    Alcohol/week: 0.0 standard drinks of alcohol   Drug use: No   Allergies Patient has no  known allergies.  Review of Systems Review of Systems  Gastrointestinal:  Positive for abdominal distention and abdominal pain.    Physical Exam Vital Signs  I have reviewed the triage vital signs BP 128/75   Pulse 75   Temp 97.7 F (36.5 C) (Oral)   Resp 16   SpO2 97%   Physical Exam Vitals and nursing note reviewed.  Constitutional:      General: She is not in acute distress.    Appearance: She is well-developed.  HENT:     Head: Normocephalic and atraumatic.  Eyes:     Conjunctiva/sclera: Conjunctivae normal.  Cardiovascular:     Rate and Rhythm: Normal rate and regular rhythm.  Heart sounds: No murmur heard. Pulmonary:     Effort: Pulmonary effort is normal. No respiratory distress.     Breath sounds: Normal breath sounds.  Abdominal:     Palpations: Abdomen is soft.     Tenderness: There is abdominal tenderness in the epigastric area.  Musculoskeletal:        General: No swelling.     Cervical back: Neck supple.  Skin:    General: Skin is warm and dry.     Capillary Refill: Capillary refill takes less than 2 seconds.  Neurological:     Mental Status: She is alert.  Psychiatric:        Mood and Affect: Mood normal.     ED Results and Treatments Labs (all labs ordered are listed, but only abnormal results are displayed) Labs Reviewed  COMPREHENSIVE METABOLIC PANEL - Abnormal; Notable for the following components:      Result Value   Potassium 3.4 (*)    Calcium 8.7 (*)    AST 14 (*)    All other components within normal limits  CBC - Abnormal; Notable for the following components:   WBC 11.5 (*)    RBC 5.17 (*)    All other components within normal limits  LIPASE, BLOOD  URINALYSIS, ROUTINE W REFLEX MICROSCOPIC                                                                                                                          Radiology No results found.  Pertinent labs & imaging results that were available during my care of the patient  were reviewed by me and considered in my medical decision making (see MDM for details).  Medications Ordered in ED Medications  alum & mag hydroxide-simeth (MAALOX/MYLANTA) 200-200-20 MG/5ML suspension 30 mL (30 mLs Oral Given 06/23/22 2153)  lidocaine (XYLOCAINE) 2 % viscous mouth solution 15 mL (15 mLs Mouth/Throat Given 06/23/22 2153)                                                                                                                                     Procedures Procedures  (including critical care time)  Medical Decision Making / ED Course   This patient presents to the ED for concern of epigastric abdominal pain, this involves an extensive number of treatment options, and is a complaint that carries with it a high risk of complications and  morbidity.  The differential diagnosis includes acute on chronic gastritis, intestinal gas, pancreatitis, cholecystitis  MDM: Patient seen emergency room for evaluation of abdominal pain.  Physical exam with epigastric tenderness to palpation and abdominal distention.  Laboratory evaluation with leukocytosis to 11.5, mild hypokalemia to 3.4 but is otherwise unremarkable.  CT abdomen pelvis unremarkable.  Patient given a GI cocktail and on reevaluation states her pain is resolved and she is requesting to be discharged.  Patient discharged on Maalox and Bentyl and will follow-up with for her scheduled EGD.   Additional history obtained:  -External records from outside source obtained and reviewed including: Chart review including previous notes, labs, imaging, consultation notes   Lab Tests: -I ordered, reviewed, and interpreted labs.   The pertinent results include:   Labs Reviewed  COMPREHENSIVE METABOLIC PANEL - Abnormal; Notable for the following components:      Result Value   Potassium 3.4 (*)    Calcium 8.7 (*)    AST 14 (*)    All other components within normal limits  CBC - Abnormal; Notable for the following components:    WBC 11.5 (*)    RBC 5.17 (*)    All other components within normal limits  LIPASE, BLOOD  URINALYSIS, ROUTINE W REFLEX MICROSCOPIC     Imaging Studies ordered: I ordered imaging studies including CT abdomen pelvis I independently visualized and interpreted imaging. I agree with the radiologist interpretation   Medicines ordered and prescription drug management: Meds ordered this encounter  Medications   alum & mag hydroxide-simeth (MAALOX/MYLANTA) 200-200-20 MG/5ML suspension 30 mL   lidocaine (XYLOCAINE) 2 % viscous mouth solution 15 mL    -I have reviewed the patients home medicines and have made adjustments as needed  Critical interventions none   Cardiac Monitoring: The patient was maintained on a cardiac monitor.  I personally viewed and interpreted the cardiac monitored which showed an underlying rhythm of: NSR  Social Determinants of Health:  Factors impacting patients care include: none   Reevaluation: After the interventions noted above, I reevaluated the patient and found that they have :improved  Co morbidities that complicate the patient evaluation  Past Medical History:  Diagnosis Date   Chronic back pain    Constipation    Constipation    COPD (chronic obstructive pulmonary disease) (HCC)    DVT (deep venous thrombosis) (HCC)    GERD (gastroesophageal reflux disease)    Headache    History of kidney stones    Hyperlipidemia    Hypertension       Dispostion: I considered admission for this patient, but with symptoms improved and overall reassuring work-up patient safe for discharge outpatient follow-up     Final Clinical Impression(s) / ED Diagnoses Final diagnoses:  None     '@PCDICTATION'$ @    Teressa Lower, MD 06/24/22 0022

## 2022-06-23 NOTE — Telephone Encounter (Signed)
Spoke with pt. She has been scheduled for EGD +/-dil with propofol asa 3 on 7/31 at 9:45am. Aware will mail instructions/pre-op appt,

## 2022-06-27 ENCOUNTER — Telehealth: Payer: Self-pay | Admitting: Gastroenterology

## 2022-06-27 NOTE — Telephone Encounter (Signed)
Patient was seen in the ED on 7/7 for epigastric pain (1 day after office visit with me).  Reviewed labs.  No acute abnormalities.  She also had CT A/P with contrast with no acute findings.  She was given GI cocktail with symptom improvement.  She was discharged on Maalox and Bentyl and advised to keep her scheduled appointment for EGD.   No additional recommendations at this time.

## 2022-07-05 ENCOUNTER — Other Ambulatory Visit: Payer: Self-pay | Admitting: Gastroenterology

## 2022-07-05 DIAGNOSIS — K219 Gastro-esophageal reflux disease without esophagitis: Secondary | ICD-10-CM | POA: Diagnosis not present

## 2022-07-06 LAB — COMPREHENSIVE METABOLIC PANEL
ALT: 14 IU/L (ref 0–32)
AST: 15 IU/L (ref 0–40)
Albumin/Globulin Ratio: 1.6 (ref 1.2–2.2)
Albumin: 4.1 g/dL (ref 3.9–4.9)
Alkaline Phosphatase: 134 IU/L — ABNORMAL HIGH (ref 44–121)
BUN/Creatinine Ratio: 7 — ABNORMAL LOW (ref 12–28)
BUN: 7 mg/dL — ABNORMAL LOW (ref 8–27)
Bilirubin Total: 0.4 mg/dL (ref 0.0–1.2)
CO2: 22 mmol/L (ref 20–29)
Calcium: 9.3 mg/dL (ref 8.7–10.3)
Chloride: 107 mmol/L — ABNORMAL HIGH (ref 96–106)
Creatinine, Ser: 1.03 mg/dL — ABNORMAL HIGH (ref 0.57–1.00)
Globulin, Total: 2.5 g/dL (ref 1.5–4.5)
Glucose: 86 mg/dL (ref 70–99)
Potassium: 4.5 mmol/L (ref 3.5–5.2)
Sodium: 142 mmol/L (ref 134–144)
Total Protein: 6.6 g/dL (ref 6.0–8.5)
eGFR: 61 mL/min/{1.73_m2} (ref >59)

## 2022-07-06 LAB — CBC WITH DIFFERENTIAL/PLATELET
Basophils Absolute: 0 10*3/uL (ref 0.0–0.2)
Basos: 1 %
EOS (ABSOLUTE): 0.1 10*3/uL (ref 0.0–0.4)
Eos: 1 %
Hematocrit: 42.9 % (ref 34.0–46.6)
Hemoglobin: 14.5 g/dL (ref 11.1–15.9)
Immature Grans (Abs): 0 10*3/uL (ref 0.0–0.1)
Immature Granulocytes: 0 %
Lymphocytes Absolute: 3.1 10*3/uL (ref 0.7–3.1)
Lymphs: 46 %
MCH: 28.5 pg (ref 26.6–33.0)
MCHC: 33.8 g/dL (ref 31.5–35.7)
MCV: 84 fL (ref 79–97)
Monocytes Absolute: 0.6 10*3/uL (ref 0.1–0.9)
Monocytes: 8 %
Neutrophils Absolute: 3 10*3/uL (ref 1.4–7.0)
Neutrophils: 44 %
Platelets: 252 10*3/uL (ref 150–450)
RBC: 5.08 x10E6/uL (ref 3.77–5.28)
RDW: 13.9 % (ref 11.7–15.4)
WBC: 6.8 10*3/uL (ref 3.4–10.8)

## 2022-07-06 LAB — LIPASE: Lipase: 63 U/L (ref 14–72)

## 2022-07-07 ENCOUNTER — Ambulatory Visit
Admission: EM | Admit: 2022-07-07 | Discharge: 2022-07-07 | Disposition: A | Discharge status: Home / Self Care | Payer: Medicare HMO | Attending: Nurse Practitioner | Admitting: Nurse Practitioner

## 2022-07-07 ENCOUNTER — Encounter: Payer: Self-pay | Admitting: Emergency Medicine

## 2022-07-07 ENCOUNTER — Other Ambulatory Visit: Payer: Self-pay

## 2022-07-07 DIAGNOSIS — H66002 Acute suppurative otitis media without spontaneous rupture of ear drum, left ear: Secondary | ICD-10-CM | POA: Diagnosis not present

## 2022-07-07 MED ORDER — AMOXICILLIN-POT CLAVULANATE 875-125 MG PO TABS: 1.0000 | ORAL_TABLET | Freq: Two times a day (BID) | ORAL | 0 refills | Status: DC

## 2022-07-07 NOTE — Discharge Instructions (Addendum)
Take medication as prescribed. May take Tylenol or for pain, fever, or general discomfort. Warm compresses to the affected ear help with comfort. Do not stick anything inside the ear while symptoms persist. Avoid getting water inside of the ear while symptoms persist. Follow-up with your primary care physician or in this clinic if symptoms do not improve.

## 2022-07-07 NOTE — ED Triage Notes (Signed)
Pt reports left ear pain and left sided gum swelling x2 days.

## 2022-07-07 NOTE — ED Provider Notes (Signed)
[facility]    CSN: [id number] Arrival date & time: 07/07/22  1240      History   Chief Complaint Chief Complaint  Patient presents with   Ear Pain    HPI Virginia Washington is a 66 y.o. female.   The history is provided by the patient.   Patient presents for complaints of left-sided ear pain that been present for the past 2 days.  She states that the pain radiates into the left lower neck and into her left side of her head.  She states that the pain causes her to have a headache.  She also states that she has had pus appearing drainage from the left ear.  She denies fever, chills, or other upper respiratory symptoms.  She has not taken any medication for her symptoms.  Past Medical History:  Diagnosis Date   Chronic back pain    Constipation    Constipation    COPD (chronic obstructive pulmonary disease) (HCC)    DVT (deep venous thrombosis) (HCC)    GERD (gastroesophageal reflux disease)    Headache    History of kidney stones    Hyperlipidemia    Hypertension     Patient Active Problem List   Diagnosis Date Noted   IBS (irritable bowel syndrome) 01/12/2022   Abdominal pain, epigastric 08/11/2020   Early satiety 08/11/2020   Intractable abdominal pain 05/30/2019   Partial bowel obstruction ([place]) 05/30/2019   Preop cardiovascular exam 09/23/2018   PAD (peripheral artery disease) ([place]) -left iliac occlusion 09/23/2018   Dysphagia    SBO (small bowel obstruction) ([place]) 04/29/2018   GERD (gastroesophageal reflux disease) 04/28/2018   Hypokalemia 04/28/2018   Pulmonary nodule 04/28/2018   Hypernatremia 04/28/2018   Enteritis 04/28/2018   Encounter for screening colonoscopy 01/16/2018   Abdominal pain 11/16/2017   Constipation 11/16/2017   Partial small bowel obstruction ([place]) 08/16/2016   Hyperlipidemia 08/16/2016   Chest pain 12/12/2014   Hypertension 12/12/2014   Tobacco abuse 10/29/2011    Past Surgical History:  Procedure Laterality Date    ABDOMINAL AORTOGRAM W/LOWER EXTREMITY N/A 10/26/2017   Procedure: ABDOMINAL AORTOGRAM W/LOWER EXTREMITY;  Surgeon: Elam Dutch, MD;  Location: [facility];  Service: Cardiovascular: 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  Normal right aortoiliac vessels.  Three-vessel runoff bilaterally.  (Sluggish flow of the left side due to occlusion.   ABDOMINAL HYSTERECTOMY     BALLOON DILATION N/A 10/05/2020   Procedure: BALLOON DILATION;  Surgeon: [name], DO;  [facility];  Service: Endoscopy;  Laterality: N/A;   BIOPSY  10/05/2020   Procedure: BIOPSY;  Surgeon: [name], DO;  [facility];  Service: Endoscopy;;   CHOLECYSTECTOMY     COLONOSCOPY  2007   Dr. Laural Golden: small internal hemorrhoids, otherwise normal. exam could be compromised due to quality of prep   COLONOSCOPY N/A 02/15/2018   Dr. Oneida Alar: 2 simple adenomas, external and internal hemorrhoids. Next colonoscopy 2024-2026   ESOPHAGOGASTRODUODENOSCOPY N/A 05/09/2018   Procedure: ESOPHAGOGASTRODUODENOSCOPY (EGD);  Surgeon: Danie Binder, MD; web in the distal esophagus s/p dilated, mild gastritis biopsied, normal examined duodenum. Pathology with gastritis, negative for H. Pylori.     ESOPHAGOGASTRODUODENOSCOPY (EGD) WITH PROPOFOL N/A 10/05/2020   Surgeon: [name], DO; small hiatal hernia, grade B reflux esophagitis, benign-appearing esophageal stenosis dilated, gastritis biopsied- mild nonspecific reactive gastropathy, negative for H. pylori.   FEMORAL-FEMORAL BYPASS GRAFT  Bilateral 10/21/2018   Procedure: RIGHT TO LEFT FEMORAL ARTERY BYPASS GRAFT;  Surgeon: Elam Dutch, MD;  Location: [facility];  Service: Vascular;  Laterality: Bilateral;   LOWER EXTREMITY ANGIOGRAPHY N/A 09/27/2018   Procedure: LOWER EXTREMITY ANGIOGRAPHY;  Surgeon: Elam Dutch, MD;  Location: [facility];  Service: Cardiovascular;   Laterality: N/A;   NM MYOVIEW LTD  12/2014   [facility]: Normal Myoview.  Nonischemic.   TRANSTHORACIC ECHOCARDIOGRAM  12/2011   EF 55 to 60%.  No regional wall motion normality.  GRII DD.  No significant valve disease.    OB History     Gravida      Para      Term      Preterm      AB      Living  3      SAB      IAB      Ectopic      Multiple      Live Births               Home Medications    Prior to Admission medications   Medication Sig Start Date End Date Taking? Authorizing Provider  amoxicillin-clavulanate (AUGMENTIN) 875-125 MG tablet Take 1 tablet by mouth every 12 (twelve) hours. 07/07/22  Yes Ruthie Berch-Warren, Alda Lea, NP  albuterol (VENTOLIN HFA) 108 (90 Base) MCG/ACT inhaler Inhale 2 puffs into the lungs every 6 (six) hours as needed for wheezing or shortness of breath. 05/21/22   Horton, Barbette Hair, MD  alum & mag hydroxide-simeth (MAALOX MAX) 400-400-40 MG/5ML suspension Take 15 mLs by mouth every 6 (six) hours as needed for indigestion. 06/23/22   Kommor, Madison, MD  amLODipine (NORVASC) 5 MG tablet Take 5 mg by mouth daily.    [provider]  dicyclomine (BENTYL) 20 MG tablet Take 1 tablet (20 mg total) by mouth 2 (two) times daily. 06/23/22   Kommor, Madison, MD  hydrochlorothiazide (HYDRODIURIL) 12.5 MG tablet Take 12.5 mg by mouth daily. 03/02/22   [provider]  linaclotide Rolan Lipa) 290 MCG CAPS capsule Take 1 capsule (290 mcg total) by mouth daily before breakfast. 04/20/22 04/20/23  [name], DO  pantoprazole (PROTONIX) 40 MG tablet Take 1 tablet (40 mg total) by mouth 2 (two) times daily before a meal. Patient not taking: Reported on 06/23/2022 06/22/22   Erenest Rasher, PA-C  simethicone (MYLICON) 287 MG chewable tablet Chew 1 tablet after each meal and at bedtime if needed 01/12/22   [name], DO    Family History Family History  Problem Relation Age of Onset   Hypertension Mother    Peripheral  Artery Disease Brother    Colon cancer Neg Hx    Colon polyps Neg Hx     Social History Social History   Tobacco Use   Smoking status: Every Day    Packs/day: 0.50    Years: 40.00    Total pack years: 20.00    Types: Cigarettes   Smokeless tobacco: Never  Vaping Use   Vaping Use: Never used  Substance Use Topics   Alcohol use: No    Alcohol/week: 0.0 standard drinks of alcohol   Drug use: No     Allergies   Patient has no known allergies.   Review of Systems Review of Systems Per HPI  Physical Exam Triage Vital Signs ED Triage Vitals [07/07/22 1301]  Enc Vitals Group     BP 105/70  Pulse Rate 71     Resp 20     Temp 98.7 F (37.1 C)     Temp Source Oral     SpO2 94 %     Weight      Height      Head Circumference      Peak Flow      Pain Score 8     Pain Loc      Pain Edu?      Excl. in [place]?    No data found.  Updated Vital Signs BP 105/70 (BP Location: Right Arm)   Pulse 71   Temp 98.7 F (37.1 C) (Oral)   Resp 20   SpO2 94%   Visual Acuity Right Eye Distance:   Left Eye Distance:   Bilateral Distance:    Right Eye Near:   Left Eye Near:    Bilateral Near:     Physical Exam Vitals and nursing note reviewed.  Constitutional:      General: She is not in acute distress.    Appearance: She is well-developed.  HENT:     Head: Normocephalic.     Right Ear: Ear canal and external ear normal. Tympanic membrane is not erythematous or bulging.     Left Ear: External ear normal. Tympanic membrane is erythematous and bulging.     Ears:     Comments: Tender to the tragus and pinna of the left ear    Nose: Nose normal.     Mouth/Throat:     Mouth: Mucous membranes are moist.  Eyes:     Extraocular Movements: Extraocular movements intact.     Conjunctiva/sclera: Conjunctivae normal.     Pupils: Pupils are equal, round, and reactive to light.  Cardiovascular:     Rate and Rhythm: Normal rate and regular rhythm.     Heart sounds: Normal  heart sounds.  Pulmonary:     Effort: Pulmonary effort is normal.     Breath sounds: Normal breath sounds.  Abdominal:     General: Bowel sounds are normal. There is no distension.     Palpations: Abdomen is soft.     Tenderness: There is no abdominal tenderness. There is no guarding or rebound.  Genitourinary:    Vagina: Normal. No vaginal discharge.  Musculoskeletal:     Cervical back: Normal range of motion.  Lymphadenopathy:     Cervical: No cervical adenopathy.  Skin:    General: Skin is warm and dry.     Findings: No erythema or rash.  Neurological:     General: No focal deficit present.     Mental Status: She is alert and oriented to person, place, and time.     Cranial Nerves: No cranial nerve deficit.  Psychiatric:        Mood and Affect: Mood normal.        Behavior: Behavior normal.      UC Treatments / Results  Labs (all labs ordered are listed, but only abnormal results are displayed) Labs Reviewed - No data to display  EKG   Radiology No results found.  Procedures Procedures (including critical care time)  Medications Ordered in UC Medications - No data to display  Initial Impression / Assessment and Plan / UC Course  I have reviewed the triage vital signs and the nursing notes.  Pertinent labs & imaging results that were available during my care of the patient were reviewed by me and considered in my medical decision making (see chart  for details).  Patient presents for complaints of left ear pain that has been present for the past 2 days.  On exam, patient has erythema and bulging of the left tympanic membrane.  She also has tenderness to the pinna and tragus of the left ear.  We will start patient on Augmentin.  Supportive care recommendations were provided to the patient.  Patient was advised to follow-up in this clinic or with her PCP if symptoms worsen or do not improve. Final Clinical Impressions(s) / UC Diagnoses   Final diagnoses:  Acute  suppurative otitis media of left ear without spontaneous rupture of tympanic membrane, recurrence not specified     Discharge Instructions      Take medication as prescribed. May take Tylenol or for pain, fever, or general discomfort. Warm compresses to the affected ear help with comfort. Do not stick anything inside the ear while symptoms persist. Avoid getting water inside of the ear while symptoms persist. Follow-up with your primary care physician or in this clinic if symptoms do not improve.     ED Prescriptions     Medication Sig Dispense Auth. Provider   amoxicillin-clavulanate (AUGMENTIN) 875-125 MG tablet Take 1 tablet by mouth every 12 (twelve) hours. 14 tablet Dakarri Kessinger-Warren, Alda Lea, NP      PDMP not reviewed this encounter.   Tish Men, NP 07/07/22 1330

## 2022-07-10 DIAGNOSIS — I10 Essential (primary) hypertension: Secondary | ICD-10-CM | POA: Diagnosis not present

## 2022-07-10 DIAGNOSIS — I700 Atherosclerosis of aorta: Secondary | ICD-10-CM | POA: Diagnosis not present

## 2022-07-10 NOTE — Patient Instructions (Signed)
20    Your procedure is scheduled on: 07/17/2022  Report to [facility] at  7:45   AM.  Call this number if you have problems the morning of surgery: [phone]   Remember:   Follow instructions on letter from office regarding when to stop eating and drinking        No Smoking the day of procedure      Take these medicines the morning of surgery with A SIP OF WATER: Amlodipine Use inhalers if needed   Do not wear jewelry, make-up or nail polish.  Do not wear lotions, powders, or perfumes. You may wear deodorant.                Do not bring valuables to the hospital.  Contacts, dentures or bridgework may not be worn into surgery.  Leave suitcase in the car. After surgery it may be brought to your room.  For patients admitted to the hospital, checkout time is 11:00 AM the day of discharge.   Patients discharged the day of surgery will not be allowed to drive home. Upper Endoscopy, Adult Upper endoscopy is a procedure to look inside the upper GI (gastrointestinal) tract. The upper GI tract is made up of: The part of the body that moves food from your mouth to your stomach (esophagus). The stomach. The first part of your small intestine (duodenum). This procedure is also called esophagogastroduodenoscopy (EGD) or gastroscopy. In this procedure, your health care provider passes a thin, flexible tube (endoscope) through your mouth and down your esophagus into your stomach. A small camera is attached to the end of the tube. Images from the camera appear on a monitor in the exam room. During this procedure, your health care provider may also remove a small piece of tissue to be sent to a lab and examined under a microscope (biopsy). Your health care provider may do an upper endoscopy to diagnose cancers of the upper GI tract. You may also have this procedure to find the cause of other conditions, such as: Stomach pain. Heartburn. Pain or problems when swallowing. Nausea and  vomiting. Stomach bleeding. Stomach ulcers. Tell a health care provider about: Any allergies you have. All medicines you are taking, including vitamins, herbs, eye drops, creams, and over-the-counter medicines. Any problems you or family members have had with anesthetic medicines. Any blood disorders you have. Any surgeries you have had. Any medical conditions you have. Whether you are pregnant or may be pregnant. What are the risks? Generally, this is a safe procedure. However, problems may occur, including: Infection. Bleeding. Allergic reactions to medicines. A tear or hole (perforation) in the esophagus, stomach, or duodenum. What happens before the procedure? Staying hydrated Follow instructions from your health care provider about hydration, which may include: Up to 4 hours before the procedure - you may continue to drink clear liquids, such as water, clear fruit juice, black coffee, and plain tea.   Medicines Ask your health care provider about: Changing or stopping your regular medicines. This is especially important if you are taking diabetes medicines or blood thinners. Taking medicines such as aspirin and ibuprofen. These medicines can thin your blood. Do not take these medicines unless your health care provider tells you to take them. Taking over-the-counter medicines, vitamins, herbs, and supplements. General instructions Plan to have someone take you home from the hospital or clinic. If you will be going home right after the procedure, plan to have someone with you for 24 hours.  Ask your health care provider what steps will be taken to help prevent infection. What happens during the procedure?  An IV will be inserted into one of your veins. You may be given one or more of the following: A medicine to help you relax (sedative). A medicine to numb the throat (local anesthetic). You will lie on your left side on an exam table. Your health care provider will pass the  endoscope through your mouth and down your esophagus. Your health care provider will use the scope to check the inside of your esophagus, stomach, and duodenum. Biopsies may be taken. The endoscope will be removed. The procedure may vary among health care providers and hospitals. What happens after the procedure? Your blood pressure, heart rate, breathing rate, and blood oxygen level will be monitored until you leave the hospital or clinic. Do not drive for 24 hours if you were given a sedative during your procedure. When your throat is no longer numb, you may be given some fluids to drink. It is up to you to get the results of your procedure. Ask your health care provider, or the department that is doing the procedure, when your results will be ready. Summary Upper endoscopy is a procedure to look inside the upper GI tract. During the procedure, an IV will be inserted into one of your veins. You may be given a medicine to help you relax. A medicine will be used to numb your throat. The endoscope will be passed through your mouth and down your esophagus. This information is not intended to replace advice given to you by your health care provider. Make sure you discuss any questions you have with your health care provider. Document Revised: 05/29/2018 Document Reviewed: 05/06/2018 Elsevier Patient Education  [place] After  Please read the instructions outlined below and refer to this sheet in the next few weeks. These discharge instructions provide you with general information on caring for yourself after you leave the hospital. Your doctor may also give you specific instructions. While your treatment has been planned according to the most current medical practices available, unavoidable complications occasionally occur. If you have any  problems or questions after discharge, please call your doctor. HOME CARE INSTRUCTIONS Activity You may resume your regular activity but move at a slower pace for the next 24 hours.  Take frequent rest periods for the next 24 hours.  Walking will help expel (get rid of) the air and reduce the bloated feeling in your abdomen.  No driving for 24 hours (because of the anesthesia (medicine) used during the test).  You may shower.  Do not sign any important legal documents or operate any machinery for 24 hours (because of the  anesthesia used during the test).  Nutrition Drink plenty of fluids.  You may resume your normal diet.  Begin with a light meal and progress to your normal diet.  Avoid alcoholic beverages for 24 hours or as instructed by your caregiver.  Medications You may resume your normal medications unless your caregiver tells you otherwise. What you can expect today You may experience abdominal discomfort such as a feeling of fullness or "gas" pains.  You may experience a sore throat for 2 to 3 days. This is normal. Gargling with salt water may help this.  Follow-up Your doctor will discuss the results of your test with you. SEEK IMMEDIATE MEDICAL CARE IF: You have excessive nausea (feeling sick to your stomach) and/or vomiting.  You have severe abdominal pain and distention (swelling).  You have trouble swallowing.  You have a temperature over 100 F (37.8 C).  You have rectal bleeding or vomiting of blood.  Document Released: 07/18/2004 Document Revised: 11/23/2011 Document Reviewed: 01/29/2008

## 2022-07-12 ENCOUNTER — Encounter (HOSPITAL_COMMUNITY)
Admission: RE | Admit: 2022-07-12 | Discharge: 2022-07-12 | Disposition: A | Discharge status: Home / Self Care | Payer: Medicare HMO | Source: Ambulatory Visit | Attending: Internal Medicine | Admitting: Internal Medicine

## 2022-07-12 ENCOUNTER — Encounter (HOSPITAL_COMMUNITY): Payer: Self-pay

## 2022-07-12 ENCOUNTER — Other Ambulatory Visit: Payer: Self-pay

## 2022-07-17 ENCOUNTER — Ambulatory Visit (HOSPITAL_BASED_OUTPATIENT_CLINIC_OR_DEPARTMENT_OTHER): Payer: Medicare HMO | Admitting: Anesthesiology

## 2022-07-17 ENCOUNTER — Ambulatory Visit (HOSPITAL_COMMUNITY)
Admission: RE | Admit: 2022-07-17 | Discharge: 2022-07-17 | Disposition: A | Discharge status: Home / Self Care | Payer: Medicare HMO | Attending: Internal Medicine | Admitting: Internal Medicine

## 2022-07-17 ENCOUNTER — Encounter (HOSPITAL_COMMUNITY): Payer: Self-pay

## 2022-07-17 ENCOUNTER — Other Ambulatory Visit: Payer: Self-pay

## 2022-07-17 ENCOUNTER — Encounter (HOSPITAL_COMMUNITY)
Admission: RE | Disposition: A | Discharge status: Home / Self Care | Payer: Self-pay | Source: Home / Self Care | Attending: Internal Medicine

## 2022-07-17 ENCOUNTER — Ambulatory Visit (HOSPITAL_COMMUNITY): Payer: Medicare HMO | Admitting: Anesthesiology

## 2022-07-17 DIAGNOSIS — F17210 Nicotine dependence, cigarettes, uncomplicated: Secondary | ICD-10-CM | POA: Insufficient documentation

## 2022-07-17 DIAGNOSIS — K222 Esophageal obstruction: Secondary | ICD-10-CM | POA: Diagnosis not present

## 2022-07-17 DIAGNOSIS — Z720 Tobacco use: Secondary | ICD-10-CM | POA: Diagnosis not present

## 2022-07-17 DIAGNOSIS — K449 Diaphragmatic hernia without obstruction or gangrene: Secondary | ICD-10-CM | POA: Diagnosis not present

## 2022-07-17 DIAGNOSIS — I739 Peripheral vascular disease, unspecified: Secondary | ICD-10-CM | POA: Diagnosis not present

## 2022-07-17 DIAGNOSIS — K2100 Gastro-esophageal reflux disease with esophagitis, without bleeding: Secondary | ICD-10-CM | POA: Diagnosis not present

## 2022-07-17 DIAGNOSIS — R69 Illness, unspecified: Secondary | ICD-10-CM | POA: Diagnosis not present

## 2022-07-17 DIAGNOSIS — K5909 Other constipation: Secondary | ICD-10-CM | POA: Diagnosis not present

## 2022-07-17 DIAGNOSIS — J449 Chronic obstructive pulmonary disease, unspecified: Secondary | ICD-10-CM | POA: Diagnosis not present

## 2022-07-17 DIAGNOSIS — R1310 Dysphagia, unspecified: Secondary | ICD-10-CM

## 2022-07-17 DIAGNOSIS — I10 Essential (primary) hypertension: Secondary | ICD-10-CM

## 2022-07-17 DIAGNOSIS — R140 Abdominal distension (gaseous): Secondary | ICD-10-CM | POA: Insufficient documentation

## 2022-07-17 DIAGNOSIS — R1013 Epigastric pain: Secondary | ICD-10-CM | POA: Insufficient documentation

## 2022-07-17 HISTORY — PX: ESOPHAGOGASTRODUODENOSCOPY (EGD) WITH PROPOFOL: SHX5813

## 2022-07-17 HISTORY — PX: BIOPSY: SHX5522

## 2022-07-17 HISTORY — PX: BALLOON DILATION: SHX5330

## 2022-07-17 SURGERY — ESOPHAGOGASTRODUODENOSCOPY (EGD) WITH PROPOFOL
Anesthesia: General

## 2022-07-17 MED ORDER — LACTATED RINGERS IV SOLN
INTRAVENOUS | Status: DC
  Administered 2022-07-17: 1000 mL via INTRAVENOUS

## 2022-07-17 MED ORDER — LIDOCAINE 2% (20 MG/ML) 5 ML SYRINGE
INTRAMUSCULAR | Status: DC | PRN
  Administered 2022-07-17: 50 mg via INTRAVENOUS

## 2022-07-17 MED ORDER — PROPOFOL 10 MG/ML IV BOLUS
INTRAVENOUS | Status: DC | PRN
  Administered 2022-07-17: 60 mg via INTRAVENOUS
  Administered 2022-07-17 (×2): 20 mg via INTRAVENOUS

## 2022-07-17 NOTE — Anesthesia Preprocedure Evaluation (Signed)
Anesthesia Evaluation  Patient identified by MRN, date of birth, ID band Patient awake    Reviewed: Allergy & Precautions, H&P , NPO status , Patient's Chart, lab work & pertinent test results, reviewed documented beta blocker date and time   Airway Mallampati: II  TM Distance: >3 FB Neck ROM: full    Dental no notable dental hx.    Pulmonary COPD, Current Smoker and Patient abstained from smoking.,    Pulmonary exam normal breath sounds clear to auscultation       Cardiovascular Exercise Tolerance: Good hypertension, + Peripheral Vascular Disease   Rhythm:regular Rate:Normal     Neuro/Psych  Headaches, negative psych ROS   GI/Hepatic Neg liver ROS, GERD  Medicated,  Endo/Other  negative endocrine ROS  Renal/GU negative Renal ROS  negative genitourinary   Musculoskeletal   Abdominal   Peds  Hematology negative hematology ROS (+)   Anesthesia Other Findings   Reproductive/Obstetrics negative OB ROS                             Anesthesia Physical Anesthesia Plan  ASA: 3  Anesthesia Plan: General   Post-op Pain Management:    Induction:   PONV Risk Score and Plan: Propofol infusion  Airway Management Planned:   Additional Equipment:   Intra-op Plan:   Post-operative Plan:   Informed Consent: I have reviewed the patients History and Physical, chart, labs and discussed the procedure including the risks, benefits and alternatives for the proposed anesthesia with the patient or authorized representative who has indicated his/her understanding and acceptance.     Dental Advisory Given  Plan Discussed with: CRNA  Anesthesia Plan Comments:         Anesthesia Quick Evaluation

## 2022-07-17 NOTE — Interval H&P Note (Signed)
History and Physical Interval Note:  07/17/2022 9:52 AM  Virginia Washington  has presented today for surgery, with the diagnosis of dysphagia, epigastric pain.  The various methods of treatment have been discussed with the patient and family. After consideration of risks, benefits and other options for treatment, the patient has consented to  Procedure(s) with comments: ESOPHAGOGASTRODUODENOSCOPY (EGD) WITH PROPOFOL (N/A) - 9:45am BALLOON DILATION (N/A) as a surgical intervention.  The patient's history has been reviewed, patient examined, no change in status, stable for surgery.  I have reviewed the patient's chart and labs.  Questions were answered to the patient's satisfaction.     [name]

## 2022-07-17 NOTE — Op Note (Signed)
[facility] Patient Name: Virginia Washington Procedure Date: 07/17/2022 9:57 AM MRN: [id number] Date of Birth: 08-06-1956 Attending MD: Elon Alas. Abbey Chatters DO CSN: [id number] Age: 66 Admit Type: Outpatient Procedure:                Upper GI endoscopy Indications:              Epigastric abdominal pain, Dysphagia Providers:                Elon Alas. Abbey Chatters, DO, Caprice Kluver, Lambert Mody, Raphael Gibney, Technician, Aram Candela Referring MD:             Elon Alas. Abbey Chatters, DO Medicines:                See the Anesthesia note for documentation of the                            administered medications Complications:            No immediate complications. Estimated Blood Loss:     Estimated blood loss was minimal. Procedure:                Pre-Anesthesia Assessment:                           - The anesthesia plan was to use monitored                            anesthesia care (MAC).                           After obtaining informed consent, the endoscope was                            passed under direct vision. Throughout the                            procedure, the patient's blood pressure, pulse, and                            oxygen saturations were monitored continuously. The                            GIF-H190 ([id number]) scope was introduced through the                            mouth, and advanced to the second part of duodenum.                            The upper GI endoscopy was accomplished without                            difficulty. The patient tolerated the procedure  well. Scope In: 10:03:26 AM Scope Out: 10:08:34 AM Total Procedure Duration: 0 hours 5 minutes 8 seconds  Findings:      LA Grade B (one or more mucosal breaks greater than 5 mm, not extending       between the tops of two mucosal folds) esophagitis with no bleeding was       found at the gastroesophageal junction. Biopsies were taken with a cold        forceps for histology.      A small hiatal hernia was present.      A mild Schatzki ring was found in the distal esophagus. A TTS dilator       was passed through the scope. Dilation with an 18-19-20 mm balloon       dilator was performed to 20 mm. The dilation site was examined and       showed moderate improvement in luminal narrowing.      The entire examined stomach was normal.      The duodenal bulb, first portion of the duodenum and second portion of       the duodenum were normal. Impression:               - LA Grade B reflux esophagitis with no bleeding.                            Biopsied.                           - Small hiatal hernia.                           - Mild Schatzki ring. Dilated.                           - Normal stomach.                           - Normal duodenal bulb, first portion of the                            duodenum and second portion of the duodenum. Moderate Sedation:      Per Anesthesia Care Recommendation:           - Patient has a contact number available for                            emergencies. The signs and symptoms of potential                            delayed complications were discussed with the                            patient. Return to normal activities tomorrow.                            Written discharge instructions were provided to the                            patient.                           -  Resume previous diet.                           - Continue present medications.                           - Await pathology results.                           - Repeat upper endoscopy PRN for retreatment.                           - Use a proton pump inhibitor PO BID.                           - Return to GI clinic in 4 months. Procedure Code(s):        --- Professional ---                           [phone], Esophagogastroduodenoscopy, flexible,                            transoral; with transendoscopic balloon dilation of                             esophagus (less than 30 mm diameter)                           43239, 59, Esophagogastroduodenoscopy, flexible,                            transoral; with biopsy, single or multiple Diagnosis Code(s):        --- Professional ---                           K21.00, Gastro-esophageal reflux disease with                            esophagitis, without bleeding                           K44.9, Diaphragmatic hernia without obstruction or                            gangrene                           K22.2, Esophageal obstruction                           R10.13, Epigastric pain                           R13.10, Dysphagia, unspecified CPT copyright 2019 American Medical Association. All rights reserved. The codes documented in this report are preliminary and upon coder review may  be revised to meet current compliance requirements. Elon Alas. Abbey Chatters, DO [place] Omri Bertran, DO 07/17/2022 10:11:25 AM This report has been  signed electronically. Number of Addenda: 0

## 2022-07-17 NOTE — Transfer of Care (Signed)
Immediate Anesthesia Transfer of Care Note  Patient: Virginia Washington  Procedure(s) Performed: ESOPHAGOGASTRODUODENOSCOPY (EGD) WITH PROPOFOL BALLOON DILATION BIOPSY  Patient Location: Short Stay  Anesthesia Type:MAC  Level of Consciousness: sedated and responds to stimulation  Airway & Oxygen Therapy: Patient Spontanous Breathing and Patient connected to nasal cannula oxygen  Post-op Assessment: Report given to RN and Post -op Vital signs reviewed and stable  Post vital signs: Reviewed and stable  Last Vitals:  Vitals Value Taken Time  BP    Temp    Pulse    Resp    SpO2      Last Pain:  Vitals:   07/17/22 1000  TempSrc:   PainSc: 0-No pain         Complications: No notable events documented.

## 2022-07-17 NOTE — Discharge Instructions (Addendum)
EGD Discharge instructions Please read the instructions outlined below and refer to this sheet in the next few weeks. These discharge instructions provide you with general information on caring for yourself after you leave the hospital. Your doctor may also give you specific instructions. While your treatment has been planned according to the most current medical practices available, unavoidable complications occasionally occur. If you have any problems or questions after discharge, please call your doctor. ACTIVITY You may resume your regular activity but move at a slower pace for the next 24 hours.  Take frequent rest periods for the next 24 hours.  Walking will help expel (get rid of) the air and reduce the bloated feeling in your abdomen.  No driving for 24 hours (because of the anesthesia (medicine) used during the test).  You may shower.  Do not sign any important legal documents or operate any machinery for 24 hours (because of the anesthesia used during the test).  NUTRITION Drink plenty of fluids.  You may resume your normal diet.  Begin with a light meal and progress to your normal diet.  Avoid alcoholic beverages for 24 hours or as instructed by your caregiver.  MEDICATIONS You may resume your normal medications unless your caregiver tells you otherwise.  WHAT YOU CAN EXPECT TODAY You may experience abdominal discomfort such as a feeling of fullness or "gas" pains.  FOLLOW-UP Your doctor will discuss the results of your test with you.  SEEK IMMEDIATE MEDICAL ATTENTION IF ANY OF THE FOLLOWING OCCUR: Excessive nausea (feeling sick to your stomach) and/or vomiting.  Severe abdominal pain and distention (swelling).  Trouble swallowing.  Temperature over 101 F (37.8 C).  Rectal bleeding or vomiting of blood.   Your EGD showed esophagitis consistent with chronic acid reflux.  I took biopsies of this area.  You also had a tightening of your esophagus so I stretched this today.   Hopefully this helps with your swallowing.  Continue on pantoprazole twice daily for at least 12 weeks.  Follow-up with GI in 3 to 4 months.   LEFT MESSAGE AT OFFICE PATIENT NEEDS FOLLOW UP APPOINTMENT WITH [facility] IN 4 MONTHS, OFFICE TO CALL  I hope you have a great rest of your week!  Elon Alas. Abbey Chatters, D.O. Gastroenterology and Hepatology [facility]

## 2022-07-18 NOTE — Anesthesia Postprocedure Evaluation (Signed)
Anesthesia Post Note  Patient: Virginia Washington  Procedure(s) Performed: ESOPHAGOGASTRODUODENOSCOPY (EGD) WITH PROPOFOL BALLOON DILATION BIOPSY  Patient location during evaluation: Phase II Anesthesia Type: General Level of consciousness: awake Pain management: pain level controlled Vital Signs Assessment: post-procedure vital signs reviewed and stable Respiratory status: spontaneous breathing and respiratory function stable Cardiovascular status: blood pressure returned to baseline and stable Postop Assessment: no headache and no apparent nausea or vomiting Anesthetic complications: no Comments: Late entry   No notable events documented.   Last Vitals:  Vitals:   07/17/22 1011 07/17/22 1014  BP: 112/65 117/72  Pulse: 64 65  Resp: 20 20  Temp: (!) 36.3 C   SpO2: 94% 96%    Last Pain:  Vitals:   07/17/22 1014  TempSrc:   PainSc: 0-No pain                 [name]

## 2022-07-21 LAB — SURGICAL PATHOLOGY

## 2022-07-24 ENCOUNTER — Encounter (HOSPITAL_COMMUNITY): Payer: Self-pay | Admitting: Internal Medicine

## 2022-08-06 ENCOUNTER — Encounter (HOSPITAL_COMMUNITY): Payer: Self-pay

## 2022-08-06 ENCOUNTER — Emergency Department (HOSPITAL_COMMUNITY)
Admission: EM | Admit: 2022-08-06 | Discharge: 2022-08-06 | Disposition: A | Discharge status: Home / Self Care | Payer: Medicare HMO | Attending: Emergency Medicine | Admitting: Emergency Medicine

## 2022-08-06 ENCOUNTER — Emergency Department (HOSPITAL_COMMUNITY): Payer: Medicare HMO

## 2022-08-06 ENCOUNTER — Other Ambulatory Visit: Payer: Self-pay

## 2022-08-06 DIAGNOSIS — Z79899 Other long term (current) drug therapy: Secondary | ICD-10-CM | POA: Diagnosis not present

## 2022-08-06 DIAGNOSIS — R42 Dizziness and giddiness: Secondary | ICD-10-CM | POA: Insufficient documentation

## 2022-08-06 DIAGNOSIS — R0689 Other abnormalities of breathing: Secondary | ICD-10-CM | POA: Diagnosis not present

## 2022-08-06 DIAGNOSIS — R519 Headache, unspecified: Secondary | ICD-10-CM | POA: Insufficient documentation

## 2022-08-06 DIAGNOSIS — G4489 Other headache syndrome: Secondary | ICD-10-CM | POA: Diagnosis not present

## 2022-08-06 DIAGNOSIS — I10 Essential (primary) hypertension: Secondary | ICD-10-CM | POA: Insufficient documentation

## 2022-08-06 DIAGNOSIS — J449 Chronic obstructive pulmonary disease, unspecified: Secondary | ICD-10-CM | POA: Diagnosis not present

## 2022-08-06 DIAGNOSIS — R55 Syncope and collapse: Secondary | ICD-10-CM | POA: Diagnosis not present

## 2022-08-06 DIAGNOSIS — Z743 Need for continuous supervision: Secondary | ICD-10-CM | POA: Diagnosis not present

## 2022-08-06 LAB — CBC WITH DIFFERENTIAL/PLATELET
Abs Immature Granulocytes: 0.03 10*3/uL (ref 0.00–0.07)
Basophils Absolute: 0.1 10*3/uL (ref 0.0–0.1)
Basophils Relative: 1 %
Eosinophils Absolute: 0 10*3/uL (ref 0.0–0.5)
Eosinophils Relative: 0 %
HCT: 43 % (ref 36.0–46.0)
Hemoglobin: 13.7 g/dL (ref 12.0–15.0)
Immature Granulocytes: 0 %
Lymphocytes Relative: 24 %
Lymphs Abs: 2.4 10*3/uL (ref 0.7–4.0)
MCH: 28.1 pg (ref 26.0–34.0)
MCHC: 31.9 g/dL (ref 30.0–36.0)
MCV: 88.3 fL (ref 80.0–100.0)
Monocytes Absolute: 0.6 10*3/uL (ref 0.1–1.0)
Monocytes Relative: 6 %
Neutro Abs: 7.1 10*3/uL (ref 1.7–7.7)
Neutrophils Relative %: 69 %
Platelets: 231 10*3/uL (ref 150–400)
RBC: 4.87 MIL/uL (ref 3.87–5.11)
RDW: 14.2 % (ref 11.5–15.5)
WBC: 10.2 10*3/uL (ref 4.0–10.5)
nRBC: 0 % (ref 0.0–0.2)

## 2022-08-06 LAB — TROPONIN I (HIGH SENSITIVITY)
Troponin I (High Sensitivity): 2 ng/L (ref <18)
Troponin I (High Sensitivity): 2 ng/L (ref <18)

## 2022-08-06 LAB — COMPREHENSIVE METABOLIC PANEL
ALT: 13 U/L (ref 0–44)
AST: 15 U/L (ref 15–41)
Albumin: 3.7 g/dL (ref 3.5–5.0)
Alkaline Phosphatase: 96 U/L (ref 38–126)
Anion gap: 8 (ref 5–15)
BUN: 8 mg/dL (ref 8–23)
CO2: 23 mmol/L (ref 22–32)
Calcium: 8.7 mg/dL — ABNORMAL LOW (ref 8.9–10.3)
Chloride: 108 mmol/L (ref 98–111)
Creatinine, Ser: 1.04 mg/dL — ABNORMAL HIGH (ref 0.44–1.00)
GFR, Estimated: >60 mL/min (ref >60)
Glucose, Bld: 95 mg/dL (ref 70–99)
Potassium: 3.6 mmol/L (ref 3.5–5.1)
Sodium: 139 mmol/L (ref 135–145)
Total Bilirubin: 0.3 mg/dL (ref 0.3–1.2)
Total Protein: 6.9 g/dL (ref 6.5–8.1)

## 2022-08-06 MED ORDER — KETOROLAC TROMETHAMINE 30 MG/ML IJ SOLN
15.0000 mg | Freq: Once | INTRAMUSCULAR | Status: AC
  Administered 2022-08-06: 15 mg via INTRAVENOUS
  Filled 2022-08-06: qty 1

## 2022-08-06 MED ORDER — HYDROCODONE-ACETAMINOPHEN 5-325 MG PO TABS: ORAL_TABLET | ORAL | 0 refills | Status: DC

## 2022-08-06 MED ORDER — SODIUM CHLORIDE 0.9 % IV BOLUS
500.0000 mL | Freq: Once | INTRAVENOUS | Status: AC
  Administered 2022-08-06: 500 mL via INTRAVENOUS

## 2022-08-06 NOTE — ED Triage Notes (Signed)
Patient states she was at home and developed headache with dizziness then had episode of syncope. States this has happened in past. Reports headache at this time. PA to room to assess during triage. No neuro deficits noted.

## 2022-08-06 NOTE — ED Provider Notes (Signed)
[facility] EMERGENCY DEPARTMENT Provider Note   CSN: [id number] Arrival date & time: 08/06/22  1609     History {Add pertinent medical, surgical, social history, OB history to HPI:1} Chief Complaint  Patient presents with   Loss of Consciousness    Virginia Washington is a 66 y.o. female.  Patient has a history of COPD and hypertension.  She was having a headache today and had got dizzy and then passed out.  She states this is happened to her before   Loss of Consciousness      Home Medications Prior to Admission medications   Medication Sig Start Date End Date Taking? Authorizing Provider  HYDROcodone-acetaminophen (NORCO/VICODIN) 5-325 MG tablet Take 1 every 8-12 hours for a headache that is not relieved by Tylenol alone 08/06/22  Yes Milton Ferguson, MD  albuterol (VENTOLIN HFA) 108 (90 Base) MCG/ACT inhaler Inhale 2 puffs into the lungs every 6 (six) hours as needed for wheezing or shortness of breath. 05/21/22   Horton, Barbette Hair, MD  alum & mag hydroxide-simeth (MAALOX MAX) 400-400-40 MG/5ML suspension Take 15 mLs by mouth every 6 (six) hours as needed for indigestion. 06/23/22   Kommor, [place], MD  amLODipine (NORVASC) 5 MG tablet Take 5 mg by mouth daily.    [provider]  amoxicillin-clavulanate (AUGMENTIN) 875-125 MG tablet Take 1 tablet by mouth every 12 (twelve) hours. 07/07/22   Leath-Warren, Alda Lea, NP  dicyclomine (BENTYL) 20 MG tablet Take 1 tablet (20 mg total) by mouth 2 (two) times daily. 06/23/22   Kommor, [place], MD  hydrochlorothiazide (HYDRODIURIL) 12.5 MG tablet Take 12.5 mg by mouth daily. 03/02/22   [provider]  linaclotide Rolan Lipa) 290 MCG CAPS capsule Take 1 capsule (290 mcg total) by mouth daily before breakfast. 04/20/22 04/20/23  [name], DO  pantoprazole (PROTONIX) 40 MG tablet Take 1 tablet (40 mg total) by mouth 2 (two) times daily before a meal. Patient not taking: Reported on 06/23/2022 06/22/22   Erenest Rasher, PA-C   simethicone (MYLICON) 786 MG chewable tablet Chew 1 tablet after each meal and at bedtime if needed 01/12/22   [name], DO      Allergies    Patient has no known allergies.    Review of Systems   Review of Systems  Cardiovascular:  Positive for syncope.    Physical Exam Updated Vital Signs BP (!) 126/109   Pulse 63   Temp 98.7 F (37.1 C) (Oral)   Resp 14   Ht '5\' 2"'$  (1.575 m)   Wt 61.2 kg   SpO2 98%   BMI 24.69 kg/m  Physical Exam  ED Results / Procedures / Treatments   Labs (all labs ordered are listed, but only abnormal results are displayed) Labs Reviewed  COMPREHENSIVE METABOLIC PANEL - Abnormal; Notable for the following components:      Result Value   Creatinine, Ser 1.04 (*)    Calcium 8.7 (*)    All other components within normal limits  CBC WITH DIFFERENTIAL/PLATELET  TROPONIN I (HIGH SENSITIVITY)  TROPONIN I (HIGH SENSITIVITY)    EKG EKG Interpretation  Date/Time:  Sunday August 06 2022 16:20:38 EDT Ventricular Rate:  71 PR Interval:  175 QRS Duration: 86 QT Interval:  405 QTC Calculation: 441 R Axis:   64 Text Interpretation: Sinus rhythm Confirmed by Milton Ferguson [phone]) on 08/06/2022 7:59:30 PM  Radiology CT Head Wo Contrast  Result Date: 08/06/2022 CLINICAL DATA:  Dizziness, persistent/recurrent, cardiac or vascular cause suspected  EXAM: CT HEAD WITHOUT CONTRAST TECHNIQUE: Contiguous axial images were obtained from the base of the skull through the vertex without intravenous contrast. RADIATION DOSE REDUCTION: This exam was performed according to the departmental dose-optimization program which includes automated exposure control, adjustment of the mA and/or kV according to patient size and/or use of iterative reconstruction technique. COMPARISON:  Head CT 04/27/2022 FINDINGS: Brain: No intracranial hemorrhage, mass effect, or midline shift. No hydrocephalus. The basilar cisterns are patent. No evidence of territorial infarct or acute  ischemia. No extra-axial or intracranial fluid collection. Vascular: No hyperdense vessel or unexpected calcification. Skull: No fracture or focal lesion. Sinuses/Orbits: No acute findings. Other: None. IMPRESSION: Negative noncontrast head CT. Electronically Signed   By: Keith Rake M.D.   On: 08/06/2022 17:10   DG Chest Port 1 View  Result Date: 08/06/2022 CLINICAL DATA:  Syncope EXAM: PORTABLE CHEST 1 VIEW COMPARISON:  05/21/2022 FINDINGS: The heart size and mediastinal contours are within normal limits. Both lungs are clear. The visualized skeletal structures are unremarkable. IMPRESSION: No acute abnormality of the lungs in AP portable projection. Electronically Signed   By: Delanna Ahmadi M.D.   On: 08/06/2022 16:54    Procedures Procedures  {Document cardiac monitor, telemetry assessment procedure when appropriate:1}  Medications Ordered in ED Medications  sodium chloride 0.9 % bolus 500 mL (0 mLs Intravenous Stopped 08/06/22 1745)  ketorolac (TORADOL) 30 MG/ML injection 15 mg (15 mg Intravenous Given 08/06/22 1652)    ED Course/ Medical Decision Making/ A&P                           Medical Decision Making Amount and/or Complexity of Data Reviewed Labs: ordered. Radiology: ordered. ECG/medicine tests: ordered.  Risk Prescription drug management.   Patient with atypical headache and syncope.  Labs unremarkable.  Patient wished to go home.  She will follow-up with her PCP  {Document critical care time when appropriate:1} {Document review of labs and clinical decision tools ie heart score, Chads2Vasc2 etc:1}  {Document your independent review of radiology images, and any outside records:1} {Document your discussion with family members, caretakers, and with consultants:1} {Document social determinants of health affecting pt's care:1} {Document your decision making why or why not admission, treatments were needed:1} Final Clinical Impression(s) / ED Diagnoses Final  diagnoses:  Syncope and collapse    Rx / DC Orders ED Discharge Orders          Ordered    HYDROcodone-acetaminophen (NORCO/VICODIN) 5-325 MG tablet        08/06/22 2003

## 2022-08-06 NOTE — ED Notes (Signed)
Pt ambulated to restroom w/o assistance. 

## 2022-08-06 NOTE — Discharge Instructions (Addendum)
Follow-up with your doctor this week for recheck.  Return if any problems

## 2022-09-03 ENCOUNTER — Emergency Department (HOSPITAL_COMMUNITY): Payer: Medicare HMO

## 2022-09-03 ENCOUNTER — Emergency Department (HOSPITAL_COMMUNITY)
Admission: EM | Admit: 2022-09-03 | Discharge: 2022-09-04 | Disposition: A | Discharge status: Home / Self Care | Payer: Medicare HMO | Attending: Emergency Medicine | Admitting: Emergency Medicine

## 2022-09-03 ENCOUNTER — Encounter (HOSPITAL_COMMUNITY): Payer: Self-pay | Admitting: Emergency Medicine

## 2022-09-03 DIAGNOSIS — Z79899 Other long term (current) drug therapy: Secondary | ICD-10-CM | POA: Insufficient documentation

## 2022-09-03 DIAGNOSIS — J189 Pneumonia, unspecified organism: Secondary | ICD-10-CM

## 2022-09-03 DIAGNOSIS — R059 Cough, unspecified: Secondary | ICD-10-CM | POA: Diagnosis not present

## 2022-09-03 DIAGNOSIS — J449 Chronic obstructive pulmonary disease, unspecified: Secondary | ICD-10-CM | POA: Diagnosis not present

## 2022-09-03 DIAGNOSIS — Z20822 Contact with and (suspected) exposure to covid-19: Secondary | ICD-10-CM | POA: Insufficient documentation

## 2022-09-03 DIAGNOSIS — I10 Essential (primary) hypertension: Secondary | ICD-10-CM | POA: Diagnosis not present

## 2022-09-03 DIAGNOSIS — R0981 Nasal congestion: Secondary | ICD-10-CM | POA: Insufficient documentation

## 2022-09-03 NOTE — ED Provider Notes (Signed)
[facility] EMERGENCY DEPARTMENT Provider Note   CSN: [id number] Arrival date & time: 09/03/22  2241     History  Chief Complaint  Patient presents with   Cough    Virginia Washington is a 66 y.o. female.  Patient is a 66 year old female with past medical history of hypertension, hyperlipidemia, peripheral artery disease, COPD.  Patient presenting today for evaluation of cough.  This has been worsening over the past 2 days.  She describes persistent cough that is worse when she attempts to lay down and sleep.  She denies fevers or chills.  She denies to me she is having chest pain.  She does describe some shortness of breath, but gets some relief with her inhalers.  She denies any ill contacts.  The history is provided by the patient.       Home Medications Prior to Admission medications   Medication Sig Start Date End Date Taking? Authorizing Provider  albuterol (VENTOLIN HFA) 108 (90 Base) MCG/ACT inhaler Inhale 2 puffs into the lungs every 6 (six) hours as needed for wheezing or shortness of breath. 05/21/22   Horton, Barbette Hair, MD  alum & mag hydroxide-simeth (MAALOX MAX) 400-400-40 MG/5ML suspension Take 15 mLs by mouth every 6 (six) hours as needed for indigestion. 06/23/22   Kommor, [place], MD  amLODipine (NORVASC) 5 MG tablet Take 5 mg by mouth daily.    [provider]  amoxicillin-clavulanate (AUGMENTIN) 875-125 MG tablet Take 1 tablet by mouth every 12 (twelve) hours. 07/07/22   Leath-Warren, Alda Lea, NP  dicyclomine (BENTYL) 20 MG tablet Take 1 tablet (20 mg total) by mouth 2 (two) times daily. 06/23/22   Kommor, [place], MD  hydrochlorothiazide (HYDRODIURIL) 12.5 MG tablet Take 12.5 mg by mouth daily. 03/02/22   [provider]  HYDROcodone-acetaminophen (NORCO/VICODIN) 5-325 MG tablet Take 1 every 8-12 hours for a headache that is not relieved by Tylenol alone 08/06/22   Milton Ferguson, MD  linaclotide [facility]) 290 MCG CAPS capsule Take 1 capsule (290 mcg  total) by mouth daily before breakfast. 04/20/22 04/20/23  [name], DO  pantoprazole (PROTONIX) 40 MG tablet Take 1 tablet (40 mg total) by mouth 2 (two) times daily before a meal. Patient not taking: Reported on 06/23/2022 06/22/22   Erenest Rasher, PA-C  simethicone (MYLICON) 004 MG chewable tablet Chew 1 tablet after each meal and at bedtime if needed 01/12/22   [name], DO      Allergies    Patient has no known allergies.    Review of Systems   Review of Systems  All other systems reviewed and are negative.   Physical Exam Updated Vital Signs BP 130/70 (BP Location: Right Arm)   Pulse 84   Temp 99.1 F (37.3 C) (Oral)   Resp (!) 24   Ht '5\' 2"'$  (1.575 m)   Wt 61.2 kg   SpO2 96%   BMI 24.69 kg/m  Physical Exam Vitals and nursing note reviewed.  Constitutional:      General: She is not in acute distress.    Appearance: She is well-developed. She is not diaphoretic.  HENT:     Head: Normocephalic and atraumatic.  Cardiovascular:     Rate and Rhythm: Normal rate and regular rhythm.     Heart sounds: No murmur heard.    No friction rub. No gallop.  Pulmonary:     Effort: Pulmonary effort is normal. No respiratory distress.     Breath sounds: Normal breath sounds.  No wheezing.  Abdominal:     General: Bowel sounds are normal. There is no distension.     Palpations: Abdomen is soft.     Tenderness: There is no abdominal tenderness.  Musculoskeletal:        General: Normal range of motion.     Cervical back: Normal range of motion and neck supple.  Skin:    General: Skin is warm and dry.  Neurological:     General: No focal deficit present.     Mental Status: She is alert and oriented to person, place, and time.     ED Results / Procedures / Treatments   Labs (all labs ordered are listed, but only abnormal results are displayed) Labs Reviewed  SARS CORONAVIRUS 2 BY RT PCR    EKG None  Radiology No results found.  Procedures Procedures   {Document cardiac monitor, telemetry assessment procedure when appropriate:1}  Medications Ordered in ED Medications - No data to display  ED Course/ Medical Decision Making/ A&P                           Medical Decision Making Amount and/or Complexity of Data Reviewed Radiology: ordered.   ***  {Document critical care time when appropriate:1} {Document review of labs and clinical decision tools ie heart score, Chads2Vasc2 etc:1}  {Document your independent review of radiology images, and any outside records:1} {Document your discussion with family members, caretakers, and with consultants:1} {Document social determinants of health affecting pt's care:1} {Document your decision making why or why not admission, treatments were needed:1} Final Clinical Impression(s) / ED Diagnoses Final diagnoses:  None    Rx / DC Orders ED Discharge Orders     None

## 2022-09-03 NOTE — ED Triage Notes (Signed)
Pt c/o cough for the past 2 days. States it gets worse at night and when she tries to lay down.

## 2022-09-04 DIAGNOSIS — R059 Cough, unspecified: Secondary | ICD-10-CM | POA: Diagnosis not present

## 2022-09-04 LAB — SARS CORONAVIRUS 2 BY RT PCR: SARS Coronavirus 2 by RT PCR: NEGATIVE

## 2022-09-04 MED ORDER — AZITHROMYCIN 250 MG PO TABS
500.0000 mg | ORAL_TABLET | Freq: Once | ORAL | Status: AC
  Administered 2022-09-04: 500 mg via ORAL
  Filled 2022-09-04: qty 2

## 2022-09-04 MED ORDER — HYDROCOD POLI-CHLORPHE POLI ER 10-8 MG/5ML PO SUER: 5.0000 mL | Freq: Two times a day (BID) | ORAL | 0 refills | Status: DC | PRN

## 2022-09-04 MED ORDER — AZITHROMYCIN 250 MG PO TABS: 250.0000 mg | ORAL_TABLET | Freq: Every day | ORAL | 0 refills | Status: DC

## 2022-09-04 NOTE — Discharge Instructions (Signed)
Begin taking Zithromax as prescribed.  Begin taking Tussionex as prescribed as needed for cough.  Continue use of your inhalers as needed for wheezing/difficulty breathing.  Follow-up with primary doctor if not improving in the next week, and return to the ER if symptoms significantly worsen or change.

## 2022-09-04 NOTE — ED Notes (Signed)
Discharge instructions reviewed with the patient. Patient did not have any concerns or questions to report. Patient discharged.

## 2022-09-07 DIAGNOSIS — I10 Essential (primary) hypertension: Secondary | ICD-10-CM | POA: Diagnosis not present

## 2022-09-07 DIAGNOSIS — J440 Chronic obstructive pulmonary disease with acute lower respiratory infection: Secondary | ICD-10-CM | POA: Diagnosis not present

## 2022-09-07 DIAGNOSIS — R69 Illness, unspecified: Secondary | ICD-10-CM | POA: Diagnosis not present

## 2022-09-07 DIAGNOSIS — F17219 Nicotine dependence, cigarettes, with unspecified nicotine-induced disorders: Secondary | ICD-10-CM | POA: Diagnosis not present

## 2022-09-07 DIAGNOSIS — F17210 Nicotine dependence, cigarettes, uncomplicated: Secondary | ICD-10-CM | POA: Diagnosis not present

## 2022-09-08 ENCOUNTER — Telehealth: Payer: Self-pay

## 2022-09-08 NOTE — Telephone Encounter (Signed)
        Patient  visited Dorita Fray on 9/17     No good contact information    [facility]  [phone] [address], [place], [place] [address] Phone: [phone] Email: [email or web address]

## 2022-09-11 ENCOUNTER — Ambulatory Visit: Payer: Medicare HMO | Admitting: *Deleted

## 2022-10-02 DIAGNOSIS — Z23 Encounter for immunization: Secondary | ICD-10-CM | POA: Diagnosis not present

## 2022-10-11 ENCOUNTER — Other Ambulatory Visit (HOSPITAL_COMMUNITY): Payer: Self-pay | Admitting: Gerontology

## 2022-10-11 ENCOUNTER — Ambulatory Visit (HOSPITAL_COMMUNITY)
Admission: RE | Admit: 2022-10-11 | Discharge: 2022-10-11 | Disposition: A | Discharge status: Home / Self Care | Payer: Medicare HMO | Source: Ambulatory Visit | Attending: Gerontology | Admitting: Gerontology

## 2022-10-11 DIAGNOSIS — M545 Low back pain, unspecified: Secondary | ICD-10-CM | POA: Insufficient documentation

## 2022-10-11 DIAGNOSIS — I10 Essential (primary) hypertension: Secondary | ICD-10-CM | POA: Diagnosis not present

## 2022-10-11 DIAGNOSIS — R202 Paresthesia of skin: Secondary | ICD-10-CM | POA: Diagnosis not present

## 2022-10-11 NOTE — Progress Notes (Deleted)
Referring Provider: Carrolyn Meiers* Primary Care Physician:  Carrolyn Meiers, MD Primary GI Physician: Dr. Abbey Chatters  No chief complaint on file.   HPI:   [place]ia Washington is a 66 y.o. female with GI history of partial SBO in 2020 managed conservatively, constipation, GERD with esophagitis, dysphagia with esophageal stenosis and Schatzki ring, chronic abdominal pain, adenomatous colon polyps with surveillance due between March 2024-2026, presenting today for follow-up of constipation, epigastric pain, GERD, dysphagia.  Last seen in our office 06/22/2022 for constipation, epigastric abdominal pain, GERD, dysphagia.  Epigastric pain started in April, worsened by meals, associated bloating, distention, nausea without vomiting.  Reported reflux was well controlled on pantoprazole daily.  She was taking meloxicam twice daily.  No other NSAIDs.  Noted prior ER evaluation in April for the same symptoms with CT and labs without acute findings.  Bowels are moving 2-3 times a week taking Linzess 290 mcg a couple times a week when her stomach got bloated.  Stated Linzess was too strong to take daily.  Recommended increasing PPI to twice daily, update labs, EGD, and try decreasing Linzess to 145 mcg daily.  Samples of Linzess were provided.  Requested 1 week progress report.  No progress report received.  Labs 07/05/2022: CBC and lipase within normal limits.  Slight elevation of alk phos at 134, otherwise no significant abnormalities on CMP.  Most recent labs 08/06/2022 with alk phos normalized.  EGD 07/17/2022: Grade B reflux esophagitis biopsied, small hiatal hernia, mild Schatzki's ring s/p dilation, normal stomach and examined duodenum.  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 Recommended PPI twice daily and repeating EGD with rebiopsy in 3 months.  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  Normal right aortoiliac vessels.  Three-vessel runoff bilaterally.  (Sluggish flow of the left side due to occlusion.   ABDOMINAL HYSTERECTOMY     BALLOON DILATION N/A 10/05/2020   Procedure: BALLOON DILATION;  Surgeon: [name], DO;  [facility]: [facility];  Service: Endoscopy;  Laterality: N/A;   BALLOON DILATION N/A 07/17/2022   Procedure: BALLOON DILATION;  Surgeon: [name], DO;  [facility];  Service: Endoscopy;  Laterality: N/A;   BIOPSY  10/05/2020   Procedure: BIOPSY;  Surgeon: [name], DO;  [facility]: [facility];  Service: Endoscopy;;   BIOPSY  07/17/2022   Procedure: BIOPSY;  Surgeon: [name], DO;  [facility]: [facility];  Service: Endoscopy;;   CHOLECYSTECTOMY     COLONOSCOPY  2007   Dr. Laural Golden: small internal hemorrhoids, otherwise normal. exam could be compromised due to quality of prep   COLONOSCOPY N/A 02/15/2018   Dr. Oneida Alar: 2 simple adenomas, external and internal hemorrhoids. Next colonoscopy 2024-2026  ESOPHAGOGASTRODUODENOSCOPY N/A 05/09/2018   Procedure: ESOPHAGOGASTRODUODENOSCOPY (EGD);  Surgeon: Danie Binder, MD; web in the distal esophagus s/p dilated, mild gastritis biopsied, normal examined duodenum. Pathology  with gastritis, negative for H. Pylori.     ESOPHAGOGASTRODUODENOSCOPY (EGD) WITH PROPOFOL N/A 10/05/2020   Surgeon: [name], DO; small hiatal hernia, grade B reflux esophagitis, benign-appearing esophageal stenosis dilated, gastritis biopsied- mild nonspecific reactive gastropathy, negative for H. pylori.   ESOPHAGOGASTRODUODENOSCOPY (EGD) WITH PROPOFOL N/A 07/17/2022   Procedure: ESOPHAGOGASTRODUODENOSCOPY (EGD) WITH PROPOFOL;  Surgeon: [name], DO;  Location: [facility];  Service: Endoscopy;  Laterality: N/A;  9:45am   FEMORAL-FEMORAL BYPASS GRAFT Bilateral 10/21/2018   Procedure: RIGHT TO LEFT FEMORAL ARTERY BYPASS GRAFT;  Surgeon: Elam Dutch, MD;  Location: [facility];  Service: Vascular;  Laterality: Bilateral;   LOWER EXTREMITY ANGIOGRAPHY N/A 09/27/2018   Procedure: LOWER EXTREMITY ANGIOGRAPHY;  Surgeon: Elam Dutch, MD;  Location: [facility];  Service: Cardiovascular;  Laterality: N/A;   NM MYOVIEW LTD  12/2014   [facility]: Normal Myoview.  Nonischemic.   TRANSTHORACIC ECHOCARDIOGRAM  12/2011   EF 55 to 60%.  No regional wall motion normality.  GRII DD.  No significant valve disease.   VASCULAR SURGERY      Current Outpatient Medications  Medication Sig Dispense Refill   albuterol (VENTOLIN HFA) 108 (90 Base) MCG/ACT inhaler Inhale 2 puffs into the lungs every 6 (six) hours as needed for wheezing or shortness of breath. 1 each 0   alum & mag hydroxide-simeth (MAALOX MAX) 400-400-40 MG/5ML suspension Take 15 mLs by mouth every 6 (six) hours as needed for indigestion. 355 mL 0   amLODipine (NORVASC) 5 MG tablet Take 5 mg by mouth daily.     amoxicillin-clavulanate (AUGMENTIN) 875-125 MG tablet Take 1 tablet by mouth every 12 (twelve) hours. 14 tablet 0   azithromycin (ZITHROMAX) 250 MG tablet Take 1 tablet (250 mg total) by mouth daily. 4 tablet 0   chlorpheniramine-HYDROcodone (TUSSIONEX) 10-8 MG/5ML Take 5 mLs by mouth every 12 (twelve)  hours as needed for cough. 70 mL 0   dicyclomine (BENTYL) 20 MG tablet Take 1 tablet (20 mg total) by mouth 2 (two) times daily. 20 tablet 0   hydrochlorothiazide (HYDRODIURIL) 12.5 MG tablet Take 12.5 mg by mouth daily.     HYDROcodone-acetaminophen (NORCO/VICODIN) 5-325 MG tablet Take 1 every 8-12 hours for a headache that is not relieved by Tylenol alone 5 tablet 0   linaclotide (LINZESS) 290 MCG CAPS capsule Take 1 capsule (290 mcg total) by mouth daily before breakfast. 90 capsule 3   pantoprazole (PROTONIX) 40 MG tablet Take 1 tablet (40 mg total) by mouth 2 (two) times daily before a meal. (Patient not taking: Reported on 06/23/2022) 60 tablet 3   simethicone (MYLICON) 374 MG chewable tablet Chew 1 tablet after each meal and at bedtime if needed 120 tablet 5   No current facility-administered medications for this visit.    Allergies as of 10/12/2022   (No Known Allergies)    Family History  Problem Relation Age of Onset   Hypertension Mother    Peripheral Artery Disease Brother    Colon cancer Neg Hx    Colon polyps Neg Hx     Social History   Socioeconomic History   Marital status: Widowed    Spouse name: Not on file   Number of children: Not on file   Years of education: Not on file   Highest  education level: Not on file  Occupational History   Occupation: unemployed    Comment: Draws pension from her husband's life insurance policy  Tobacco Use   Smoking status: Every Day    Packs/day: 0.50    Years: 40.00    Total pack years: 20.00    Types: Cigarettes   Smokeless tobacco: Never  Vaping Use   Vaping Use: Never used  Substance and Sexual Activity   Alcohol use: No    Alcohol/week: 0.0 standard drinks of alcohol   Drug use: No   Sexual activity: Not Currently  Other Topics Concern   Not on file  Social History Narrative   Widowed: [place].  mother 83, grandmother of 60 with one great grandchild.  She currently lives alone.  She completed ninth  grade.  Smokes a pack a day for 42 years.  Tries to walks for about 5 minutes at x7 days a week with plans to help revascularization.       HOBBIES: PUZZLE BOOKS.   Social Determinants of Health   Financial Resource Strain: Not on file  Food Insecurity: Not on file  Transportation Needs: Not on file  Physical Activity: Not on file  Stress: Not on file  Social Connections: Not on file    Review of Systems: Gen: Denies fever, chills, cold or flu like symptoms, pre-syncope, or syncope.   CV: Denies chest pain, palpitations. Resp: Denies dyspnea, cough.  GI: See HPI Heme: See HPI  Physical Exam: There were no vitals taken for this visit. General:   Alert and oriented. No distress noted. Pleasant and cooperative.  Head:  Normocephalic and atraumatic. Eyes:  Conjuctiva clear without scleral icterus. Heart:  S1, S2 present without murmurs appreciated. Lungs:  Clear to auscultation bilaterally. No wheezes, rales, or rhonchi. No distress.  Abdomen:  +BS, soft, non-tender and non-distended. No rebound or guarding. No HSM or masses noted. Msk:  Symmetrical without gross deformities. Normal posture. Extremities:  Without edema. Neurologic:  Alert and  oriented x4 Psych:  Normal mood and affect.    Assessment:     Plan:  ***   Aliene Altes, PA-C [facility] 10/12/2022

## 2022-10-12 ENCOUNTER — Ambulatory Visit: Payer: Medicare HMO | Admitting: Gastroenterology

## 2022-10-17 ENCOUNTER — Encounter: Payer: Self-pay | Admitting: *Deleted

## 2022-11-06 DIAGNOSIS — J440 Chronic obstructive pulmonary disease with acute lower respiratory infection: Secondary | ICD-10-CM | POA: Diagnosis not present

## 2022-11-06 DIAGNOSIS — R0981 Nasal congestion: Secondary | ICD-10-CM | POA: Diagnosis not present

## 2022-11-09 ENCOUNTER — Emergency Department (HOSPITAL_COMMUNITY)
Admission: EM | Admit: 2022-11-09 | Discharge: 2022-11-10 | Disposition: A | Discharge status: Home / Self Care | Payer: Medicare HMO | Attending: Emergency Medicine | Admitting: Emergency Medicine

## 2022-11-09 DIAGNOSIS — R1084 Generalized abdominal pain: Secondary | ICD-10-CM

## 2022-11-09 DIAGNOSIS — R112 Nausea with vomiting, unspecified: Secondary | ICD-10-CM | POA: Insufficient documentation

## 2022-11-09 DIAGNOSIS — Z79899 Other long term (current) drug therapy: Secondary | ICD-10-CM | POA: Insufficient documentation

## 2022-11-09 DIAGNOSIS — R059 Cough, unspecified: Secondary | ICD-10-CM | POA: Diagnosis not present

## 2022-11-09 DIAGNOSIS — I10 Essential (primary) hypertension: Secondary | ICD-10-CM | POA: Diagnosis not present

## 2022-11-09 DIAGNOSIS — R109 Unspecified abdominal pain: Secondary | ICD-10-CM | POA: Diagnosis not present

## 2022-11-09 DIAGNOSIS — I700 Atherosclerosis of aorta: Secondary | ICD-10-CM | POA: Diagnosis not present

## 2022-11-09 DIAGNOSIS — J449 Chronic obstructive pulmonary disease, unspecified: Secondary | ICD-10-CM | POA: Diagnosis not present

## 2022-11-09 DIAGNOSIS — Z20822 Contact with and (suspected) exposure to covid-19: Secondary | ICD-10-CM | POA: Diagnosis not present

## 2022-11-09 DIAGNOSIS — R051 Acute cough: Secondary | ICD-10-CM | POA: Diagnosis not present

## 2022-11-09 DIAGNOSIS — K5900 Constipation, unspecified: Secondary | ICD-10-CM | POA: Insufficient documentation

## 2022-11-10 ENCOUNTER — Emergency Department (HOSPITAL_COMMUNITY): Payer: Medicare HMO

## 2022-11-10 ENCOUNTER — Encounter (HOSPITAL_COMMUNITY): Payer: Self-pay | Admitting: Emergency Medicine

## 2022-11-10 ENCOUNTER — Other Ambulatory Visit: Payer: Self-pay

## 2022-11-10 DIAGNOSIS — R109 Unspecified abdominal pain: Secondary | ICD-10-CM | POA: Diagnosis not present

## 2022-11-10 DIAGNOSIS — R051 Acute cough: Secondary | ICD-10-CM | POA: Diagnosis not present

## 2022-11-10 DIAGNOSIS — R059 Cough, unspecified: Secondary | ICD-10-CM | POA: Diagnosis not present

## 2022-11-10 DIAGNOSIS — I700 Atherosclerosis of aorta: Secondary | ICD-10-CM | POA: Diagnosis not present

## 2022-11-10 LAB — CBC WITH DIFFERENTIAL/PLATELET
Abs Immature Granulocytes: 0.02 10*3/uL (ref 0.00–0.07)
Basophils Absolute: 0.1 10*3/uL (ref 0.0–0.1)
Basophils Relative: 1 %
Eosinophils Absolute: 0.1 10*3/uL (ref 0.0–0.5)
Eosinophils Relative: 1 %
HCT: 40.6 % (ref 36.0–46.0)
Hemoglobin: 12.8 g/dL (ref 12.0–15.0)
Immature Granulocytes: 0 %
Lymphocytes Relative: 40 %
Lymphs Abs: 3.3 10*3/uL (ref 0.7–4.0)
MCH: 28 pg (ref 26.0–34.0)
MCHC: 31.5 g/dL (ref 30.0–36.0)
MCV: 88.8 fL (ref 80.0–100.0)
Monocytes Absolute: 0.6 10*3/uL (ref 0.1–1.0)
Monocytes Relative: 8 %
Neutro Abs: 4.2 10*3/uL (ref 1.7–7.7)
Neutrophils Relative %: 50 %
Platelets: 237 10*3/uL (ref 150–400)
RBC: 4.57 MIL/uL (ref 3.87–5.11)
RDW: 14.9 % (ref 11.5–15.5)
WBC: 8.3 10*3/uL (ref 4.0–10.5)
nRBC: 0 % (ref 0.0–0.2)

## 2022-11-10 LAB — COMPREHENSIVE METABOLIC PANEL
ALT: 14 U/L (ref 0–44)
AST: 17 U/L (ref 15–41)
Albumin: 3.4 g/dL — ABNORMAL LOW (ref 3.5–5.0)
Alkaline Phosphatase: 87 U/L (ref 38–126)
Anion gap: 5 (ref 5–15)
BUN: 13 mg/dL (ref 8–23)
CO2: 24 mmol/L (ref 22–32)
Calcium: 8.4 mg/dL — ABNORMAL LOW (ref 8.9–10.3)
Chloride: 108 mmol/L (ref 98–111)
Creatinine, Ser: 0.99 mg/dL (ref 0.44–1.00)
GFR, Estimated: >60 mL/min (ref >60)
Glucose, Bld: 113 mg/dL — ABNORMAL HIGH (ref 70–99)
Potassium: 4 mmol/L (ref 3.5–5.1)
Sodium: 137 mmol/L (ref 135–145)
Total Bilirubin: 0.2 mg/dL — ABNORMAL LOW (ref 0.3–1.2)
Total Protein: 6.4 g/dL — ABNORMAL LOW (ref 6.5–8.1)

## 2022-11-10 LAB — URINALYSIS, ROUTINE W REFLEX MICROSCOPIC
Bilirubin Urine: NEGATIVE
Glucose, UA: NEGATIVE mg/dL
Hgb urine dipstick: NEGATIVE
Ketones, ur: NEGATIVE mg/dL
Leukocytes,Ua: NEGATIVE
Nitrite: NEGATIVE
Protein, ur: NEGATIVE mg/dL
Specific Gravity, Urine: 1.015 (ref 1.005–1.030)
pH: 8 (ref 5.0–8.0)

## 2022-11-10 LAB — SARS CORONAVIRUS 2 BY RT PCR: SARS Coronavirus 2 by RT PCR: NEGATIVE

## 2022-11-10 LAB — LIPASE, BLOOD: Lipase: 63 U/L — ABNORMAL HIGH (ref 11–51)

## 2022-11-10 MED ORDER — ONDANSETRON HCL 4 MG/2ML IJ SOLN
4.0000 mg | Freq: Once | INTRAMUSCULAR | Status: AC
  Administered 2022-11-10: 4 mg via INTRAVENOUS
  Filled 2022-11-10: qty 2

## 2022-11-10 MED ORDER — IOHEXOL 300 MG/ML  SOLN
100.0000 mL | Freq: Once | INTRAMUSCULAR | Status: AC | PRN
  Administered 2022-11-10: 100 mL via INTRAVENOUS

## 2022-11-10 MED ORDER — FENTANYL CITRATE PF 50 MCG/ML IJ SOSY
50.0000 ug | PREFILLED_SYRINGE | Freq: Once | INTRAMUSCULAR | Status: AC
  Administered 2022-11-10: 50 ug via INTRAVENOUS
  Filled 2022-11-10: qty 1

## 2022-11-10 NOTE — Discharge Instructions (Signed)
  SEEK IMMEDIATE MEDICAL ATTENTION IF: The pain does not go away or becomes severe, particularly over the next 8-12 hours.  A temperature above 100.4F develops.  Repeated vomiting occurs (multiple episodes).  The pain becomes localized to portions of the abdomen.  Blood is being passed in stools or vomit (bright red or black tarry stools).  Return also if you develop chest pain, difficulty breathing, dizziness or fainting, or become confused, poorly responsive, or inconsolable.  

## 2022-11-10 NOTE — ED Provider Notes (Signed)
[facility] EMERGENCY DEPARTMENT Provider Note   CSN: [id number] Arrival date & time: 11/09/22  2323     History  Chief Complaint  Patient presents with   Nasal Congestion    Virginia Washington is a 66 y.o. female.  The history is provided by the patient.  Patient history of COPD presents with multiple complaints.  She reports for over a week she has had cough, congestion and sore throat.  She was seen by her PCP and had negative viral panel.  She was given antibiotics and inhaler without any improvement.  She reports continued cough.  She reports mild shortness of breath.  She is now having abdominal pain for several days with nausea and vomiting.  She reports constipation, last bowel movement was over 4 days ago.    Past Medical History:  Diagnosis Date   Chronic back pain    Constipation    Constipation    COPD (chronic obstructive pulmonary disease) (HCC)    DVT (deep venous thrombosis) (HCC)    GERD (gastroesophageal reflux disease)    Headache    History of kidney stones    Hyperlipidemia    Hypertension     Home Medications Prior to Admission medications   Medication Sig Start Date End Date Taking? Authorizing Provider  albuterol (VENTOLIN HFA) 108 (90 Base) MCG/ACT inhaler Inhale 2 puffs into the lungs every 6 (six) hours as needed for wheezing or shortness of breath. 05/21/22   Horton, Barbette Hair, MD  alum & mag hydroxide-simeth (MAALOX MAX) 400-400-40 MG/5ML suspension Take 15 mLs by mouth every 6 (six) hours as needed for indigestion. 06/23/22   Kommor, Madison, MD  amLODipine (NORVASC) 5 MG tablet Take 5 mg by mouth daily.    [provider]  dicyclomine (BENTYL) 20 MG tablet Take 1 tablet (20 mg total) by mouth 2 (two) times daily. 06/23/22   Kommor, Madison, MD  hydrochlorothiazide (HYDRODIURIL) 12.5 MG tablet Take 12.5 mg by mouth daily. 03/02/22   [provider]  linaclotide Rolan Lipa) 290 MCG CAPS capsule Take 1 capsule (290 mcg total) by mouth  daily before breakfast. 04/20/22 04/20/23  [name], DO  pantoprazole (PROTONIX) 40 MG tablet Take 1 tablet (40 mg total) by mouth 2 (two) times daily before a meal. Patient not taking: Reported on 06/23/2022 06/22/22   Erenest Rasher, PA-C  simethicone (MYLICON) 154 MG chewable tablet Chew 1 tablet after each meal and at bedtime if needed 01/12/22   [name], DO      Allergies    Patient has no known allergies.    Review of Systems   Review of Systems  Respiratory:  Positive for cough.   Gastrointestinal:  Positive for constipation, nausea and vomiting.    Physical Exam Updated Vital Signs BP 131/83   Pulse 65   Temp 98.2 F (36.8 C)   Resp 18   Ht 1.575 m ('5\' 2"'$ )   Wt 61.2 kg   SpO2 93%   BMI 24.68 kg/m  Physical Exam CONSTITUTIONAL: Chronically ill-appearing HEAD: Normocephalic/atraumatic EYES: EOMI/PERRL ENMT: Mucous membranes moist NECK: supple no meningeal signs SPINE/BACK:entire spine nontender CV: S1/S2 noted, no murmurs/rubs/gallops noted LUNGS: Lungs are clear to auscultation bilaterally, no apparent distress ABDOMEN: soft, mild epigastric tenderness, no rebound or guarding, bowel sounds noted throughout abdomen GU:no cva tenderness NEURO: Pt is awake/alert/appropriate, moves all extremitiesx4.  No facial droop.   EXTREMITIES: pulses normal/equal, full ROM SKIN: warm, color normal PSYCH: no abnormalities of mood noted,  alert and oriented to situation  ED Results / Procedures / Treatments   Labs (all labs ordered are listed, but only abnormal results are displayed) Labs Reviewed  COMPREHENSIVE METABOLIC PANEL - Abnormal; Notable for the following components:      Result Value   Glucose, Bld 113 (*)    Calcium 8.4 (*)    Total Protein 6.4 (*)    Albumin 3.4 (*)    Total Bilirubin 0.2 (*)    All other components within normal limits  LIPASE, BLOOD - Abnormal; Notable for the following components:   Lipase 63 (*)    All other components  within normal limits  SARS CORONAVIRUS 2 BY RT PCR  CBC WITH DIFFERENTIAL/PLATELET  URINALYSIS, ROUTINE W REFLEX MICROSCOPIC    EKG EKG Interpretation  Date/Time:  Friday November 10 2022 00:09:17 EST Ventricular Rate:  70 PR Interval:  164 QRS Duration: 93 QT Interval:  415 QTC Calculation: 448 R Axis:   74 Text Interpretation: Sinus rhythm Interpretation limited secondary to artifact Confirmed by Ripley Fraise [phone]) on 11/10/2022 2:55:45 AM  Radiology CT ABDOMEN PELVIS W CONTRAST  Result Date: 11/10/2022 CLINICAL DATA:  Abdominal pain. EXAM: CT ABDOMEN AND PELVIS WITH CONTRAST TECHNIQUE: Multidetector CT imaging of the abdomen and pelvis was performed using the standard protocol following bolus administration of intravenous contrast. RADIATION DOSE REDUCTION: This exam was performed according to the departmental dose-optimization program which includes automated exposure control, adjustment of the mA and/or kV according to patient size and/or use of iterative reconstruction technique. CONTRAST:  171m OMNIPAQUE IOHEXOL 300 MG/ML  SOLN COMPARISON:  CT abdomen pelvis dated 06/23/2022. FINDINGS: Lower chest: The visualized lung bases are clear. No intra-abdominal free air or free fluid. Hepatobiliary: The liver is unremarkable. No biliary ductal dilatation. Cholecystectomy. No retained calcified stone noted in the central CBD. Pancreas: Unremarkable. No pancreatic ductal dilatation or surrounding inflammatory changes. Spleen: Normal in size without focal abnormality. Adrenals/Urinary Tract: The adrenal glands are unremarkable. There is no hydronephrosis on either side. There is symmetric enhancement and excretion of contrast by both kidneys. The visualized ureters and urinary bladder appear unremarkable. Stomach/Bowel: There is abutment of several loops of small bowel to the anterior peritoneal wall in the midline suggestive of adhesions. Mild fecalization of loops of small bowel in the  left hemiabdomen proximal to the peritoneal adhesion may represent a degree of increased transit time. No evidence of bowel obstruction at this time. The appendix is poorly visualized. No evidence of acute appendicitis. Vascular/Lymphatic: Advanced calcified and noncalcified plaque of the abdominal aorta and iliac arteries. There is occlusion of the left common and external iliac arteries. A femorofemoral bypass graft is patent. The IVC is unremarkable. No portal venous gas. There is no adenopathy. Reproductive: Hysterectomy. No adnexal masses. Multiple surgical clips within the pelvis. Other: Midline vertical anterior abdominal wall incisional scar. Musculoskeletal: No acute or significant osseous findings. IMPRESSION: 1. No acute intra-abdominal or pelvic pathology. 2. Adhesions of small bowel to the anterior peritoneal wall in the midline. No evidence of bowel obstruction at this time. 3. 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 A femorofemoral bypass graft is patent. 4.  Aortic Atherosclerosis (ICD10-I70.0). Electronically Signed   By: AAnner CreteM.D.   On: 11/10/2022 03:47   DG Chest Portable 1 View  Result Date: 11/10/2022 CLINICAL DATA:  Cough EXAM: PORTABLE CHEST 1 VIEW COMPARISON:  09/03/2022 FINDINGS: Cardiac and mediastinal contours are within normal limits. Previously noted heterogeneous opacities in the right lower  lung are no longer seen. No focal pulmonary opacity. No pleural effusion or pneumothorax. No acute osseous abnormality. IMPRESSION: No acute cardiopulmonary process. Electronically Signed   By: Merilyn Baba M.D.   On: 11/10/2022 02:05    Procedures Procedures    Medications Ordered in ED Medications  fentaNYL (SUBLIMAZE) injection 50 mcg (50 mcg Intravenous Given 11/10/22 0143)  ondansetron (ZOFRAN) injection 4 mg (4 mg Intravenous Given 11/10/22 0143)  iohexol (OMNIPAQUE) 300 MG/ML solution 100 mL (100 mLs Intravenous Contrast Given 11/10/22 0330)     ED Course/ Medical Decision Making/ A&P Clinical Course as of 11/10/22 0438  Fri Nov 10, 2022  0437 Overall workup is reassuring.  Patient is in no acute distress.  Vital signs appropriate.  Patient can be discharged home with follow-up with PCP. [DW]    Clinical Course User Index [DW] Ripley Fraise, MD                           Medical Decision Making Amount and/or Complexity of Data Reviewed Labs: ordered. Radiology: ordered.  Risk Prescription drug management.   This patient presents to the ED for concern of abdominal pain, this involves an extensive number of treatment options, and is a complaint that carries with it a high risk of complications and morbidity.  The differential diagnosis includes but is not limited to cholecystitis, cholelithiasis, pancreatitis, gastritis, peptic ulcer disease, appendicitis, bowel obstruction, bowel perforation, diverticulitis, AAA, ischemic bowel    Comorbidities that complicate the patient evaluation: Patient's presentation is complicated by their history of COPD   Additional history obtained: Records reviewed  outpatient records reviewed  Lab Tests: I Ordered, and personally interpreted labs.  The pertinent results include: Labs overall unremarkable  Imaging Studies ordered: I ordered imaging studies including CT scan abdomen pelvis and X-ray chest   I independently visualized and interpreted imaging which showed no acute findings I agree with the radiologist interpretation    Medicines ordered and prescription drug management: I ordered medication including fentanyl for pain Reevaluation of the patient after these medicines showed that the patient    improved  Reevaluation: After the interventions noted above, I reevaluated the patient and found that they have :improved  Complexity of problems addressed: Patient's presentation is most consistent with  acute presentation with potential threat to life or bodily  function  Disposition: After consideration of the diagnostic results and the patient's response to treatment,  I feel that the patent would benefit from discharge   .           Final Clinical Impression(s) / ED Diagnoses Final diagnoses:  Acute cough  Generalized abdominal pain    Rx / DC Orders ED Discharge Orders     None         Ripley Fraise, MD 11/10/22 [phone]

## 2022-11-10 NOTE — ED Triage Notes (Addendum)
Pt arrives POV c/o chest congestion, sore throat, and constipation for the past 4 days.   Pt seen last week by PCP for same. Pt was negative for COVID and Flu. Pt given abx and cough medication by PCP.

## 2022-11-16 DIAGNOSIS — I10 Essential (primary) hypertension: Secondary | ICD-10-CM | POA: Diagnosis not present

## 2022-11-16 DIAGNOSIS — J449 Chronic obstructive pulmonary disease, unspecified: Secondary | ICD-10-CM | POA: Diagnosis not present

## 2022-11-16 DIAGNOSIS — R69 Illness, unspecified: Secondary | ICD-10-CM | POA: Diagnosis not present

## 2022-11-16 DIAGNOSIS — K219 Gastro-esophageal reflux disease without esophagitis: Secondary | ICD-10-CM | POA: Diagnosis not present

## 2022-12-16 DIAGNOSIS — J440 Chronic obstructive pulmonary disease with acute lower respiratory infection: Secondary | ICD-10-CM | POA: Diagnosis not present

## 2022-12-16 DIAGNOSIS — I10 Essential (primary) hypertension: Secondary | ICD-10-CM | POA: Diagnosis not present

## 2023-01-09 DIAGNOSIS — K219 Gastro-esophageal reflux disease without esophagitis: Secondary | ICD-10-CM | POA: Diagnosis not present

## 2023-01-09 DIAGNOSIS — I700 Atherosclerosis of aorta: Secondary | ICD-10-CM | POA: Diagnosis not present

## 2023-01-09 DIAGNOSIS — J449 Chronic obstructive pulmonary disease, unspecified: Secondary | ICD-10-CM | POA: Diagnosis not present

## 2023-01-09 DIAGNOSIS — R69 Illness, unspecified: Secondary | ICD-10-CM | POA: Diagnosis not present

## 2023-01-09 DIAGNOSIS — Z1389 Encounter for screening for other disorder: Secondary | ICD-10-CM | POA: Diagnosis not present

## 2023-01-09 DIAGNOSIS — Z1331 Encounter for screening for depression: Secondary | ICD-10-CM | POA: Diagnosis not present

## 2023-01-09 DIAGNOSIS — N393 Stress incontinence (female) (male): Secondary | ICD-10-CM | POA: Diagnosis not present

## 2023-01-09 DIAGNOSIS — Z0001 Encounter for general adult medical examination with abnormal findings: Secondary | ICD-10-CM | POA: Diagnosis not present

## 2023-01-09 DIAGNOSIS — I10 Essential (primary) hypertension: Secondary | ICD-10-CM | POA: Diagnosis not present

## 2023-01-09 DIAGNOSIS — F17210 Nicotine dependence, cigarettes, uncomplicated: Secondary | ICD-10-CM | POA: Diagnosis not present

## 2023-01-09 DIAGNOSIS — R202 Paresthesia of skin: Secondary | ICD-10-CM | POA: Diagnosis not present

## 2023-01-09 DIAGNOSIS — F17219 Nicotine dependence, cigarettes, with unspecified nicotine-induced disorders: Secondary | ICD-10-CM | POA: Diagnosis not present

## 2023-01-17 DIAGNOSIS — E559 Vitamin D deficiency, unspecified: Secondary | ICD-10-CM | POA: Diagnosis not present

## 2023-01-17 DIAGNOSIS — K219 Gastro-esophageal reflux disease without esophagitis: Secondary | ICD-10-CM | POA: Diagnosis not present

## 2023-01-17 DIAGNOSIS — Z0001 Encounter for general adult medical examination with abnormal findings: Secondary | ICD-10-CM | POA: Diagnosis not present

## 2023-01-17 DIAGNOSIS — I10 Essential (primary) hypertension: Secondary | ICD-10-CM | POA: Diagnosis not present

## 2023-01-22 ENCOUNTER — Encounter: Payer: Self-pay | Admitting: Internal Medicine

## 2023-01-30 ENCOUNTER — Encounter: Payer: Self-pay | Admitting: *Deleted

## 2023-02-05 ENCOUNTER — Emergency Department (HOSPITAL_COMMUNITY)
Admission: EM | Admit: 2023-02-05 | Discharge: 2023-02-05 | Disposition: A | Discharge status: Home / Self Care | Payer: 59 | Attending: Student | Admitting: Student

## 2023-02-05 ENCOUNTER — Other Ambulatory Visit: Payer: Self-pay

## 2023-02-05 ENCOUNTER — Encounter (HOSPITAL_COMMUNITY): Payer: Self-pay

## 2023-02-05 DIAGNOSIS — J449 Chronic obstructive pulmonary disease, unspecified: Secondary | ICD-10-CM | POA: Diagnosis not present

## 2023-02-05 DIAGNOSIS — R55 Syncope and collapse: Secondary | ICD-10-CM | POA: Insufficient documentation

## 2023-02-05 DIAGNOSIS — I10 Essential (primary) hypertension: Secondary | ICD-10-CM | POA: Diagnosis not present

## 2023-02-05 DIAGNOSIS — R7989 Other specified abnormal findings of blood chemistry: Secondary | ICD-10-CM | POA: Insufficient documentation

## 2023-02-05 DIAGNOSIS — F17210 Nicotine dependence, cigarettes, uncomplicated: Secondary | ICD-10-CM | POA: Diagnosis not present

## 2023-02-05 DIAGNOSIS — E860 Dehydration: Secondary | ICD-10-CM | POA: Diagnosis not present

## 2023-02-05 LAB — CBC
HCT: 44.5 % (ref 36.0–46.0)
Hemoglobin: 14.2 g/dL (ref 12.0–15.0)
MCH: 28.2 pg (ref 26.0–34.0)
MCHC: 31.9 g/dL (ref 30.0–36.0)
MCV: 88.5 fL (ref 80.0–100.0)
Platelets: 236 10*3/uL (ref 150–400)
RBC: 5.03 MIL/uL (ref 3.87–5.11)
RDW: 14.7 % (ref 11.5–15.5)
WBC: 7.4 10*3/uL (ref 4.0–10.5)
nRBC: 0 % (ref 0.0–0.2)

## 2023-02-05 LAB — URINALYSIS, ROUTINE W REFLEX MICROSCOPIC
Bilirubin Urine: NEGATIVE
Glucose, UA: NEGATIVE mg/dL
Hgb urine dipstick: NEGATIVE
Ketones, ur: NEGATIVE mg/dL
Leukocytes,Ua: NEGATIVE
Nitrite: NEGATIVE
Protein, ur: NEGATIVE mg/dL
Specific Gravity, Urine: 1.014 (ref 1.005–1.030)
pH: 6 (ref 5.0–8.0)

## 2023-02-05 LAB — BASIC METABOLIC PANEL
Anion gap: 5 (ref 5–15)
BUN: 13 mg/dL (ref 8–23)
CO2: 27 mmol/L (ref 22–32)
Calcium: 8.6 mg/dL — ABNORMAL LOW (ref 8.9–10.3)
Chloride: 106 mmol/L (ref 98–111)
Creatinine, Ser: 1.24 mg/dL — ABNORMAL HIGH (ref 0.44–1.00)
GFR, Estimated: 49 mL/min — ABNORMAL LOW (ref >60)
Glucose, Bld: 84 mg/dL (ref 70–99)
Potassium: 4.2 mmol/L (ref 3.5–5.1)
Sodium: 138 mmol/L (ref 135–145)

## 2023-02-05 MED ORDER — LACTATED RINGERS IV BOLUS
1000.0000 mL | Freq: Once | INTRAVENOUS | Status: AC
  Administered 2023-02-05: 1000 mL via INTRAVENOUS
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
## 2023-02-05 NOTE — ED Provider Notes (Signed)
[facility] Provider Note  CSN: [id number] Arrival date & time: 02/05/23 1035  Chief Complaint(s) Loss of Consciousness  HPI Virginia Washington is a 67 y.o. female with PMH COPD, HTN, HLD who presents emergency department for evaluation of an episode of presyncope.  Patient states that she was at the store 3 days ago when she felt like she was getting dizzy and slowly lowered herself to the ground.  She states that at that time she felt like her mouth was very dry and that she was dehydrated.  She has a history of syncope that are usually linked to dehydration.  She reportedly talked to her primary care physician today who encouraged her to come to the ER for further evaluation.  Here in the emergency department, she denies chest pain, shortness of breath, abdominal pain, nausea, vomiting or any systemic symptoms.   Past Medical History Past Medical History:  Diagnosis Date   Chronic back pain    Constipation    Constipation    COPD (chronic obstructive pulmonary disease) (HCC)    DVT (deep venous thrombosis) (HCC)    GERD (gastroesophageal reflux disease)    Headache    History of kidney stones    Hyperlipidemia    Hypertension    Patient Active Problem List   Diagnosis Date Noted   IBS (irritable bowel syndrome) 01/12/2022   Abdominal pain, epigastric 08/11/2020   Early satiety 08/11/2020   Intractable abdominal pain 05/30/2019   Partial bowel obstruction ([place]) 05/30/2019   Preop cardiovascular exam 09/23/2018   PAD (peripheral artery disease) ([place]) -left iliac occlusion 09/23/2018   Dysphagia    SBO (small bowel obstruction) ([place]) 04/29/2018   GERD (gastroesophageal reflux disease) 04/28/2018   Hypokalemia 04/28/2018   Pulmonary nodule 04/28/2018   Hypernatremia 04/28/2018   Enteritis 04/28/2018   Encounter for screening colonoscopy 01/16/2018   Abdominal pain 11/16/2017   Constipation 11/16/2017   Partial small bowel  obstruction ([place]) 08/16/2016   Hyperlipidemia 08/16/2016   Chest pain 12/12/2014   Hypertension 12/12/2014   Tobacco abuse 10/29/2011   Home Medication(s) Prior to Admission medications   Medication Sig Start Date End Date Taking? Authorizing Provider  atorvastatin (LIPITOR) 20 MG tablet Take 20 mg by mouth daily. 10/03/22  Yes [provider]  gabapentin (NEURONTIN) 100 MG capsule  12/13/22  Yes [provider]  ipratropium-albuterol (DUONEB) 0.5-2.5 (3) MG/3ML SOLN SMARTSIG:1 Vial(s) Via Nebulizer Every 4-6 Hours PRN 09/08/22  Yes [provider]  Donnal Debar 100-62.5-25 MCG/ACT AEPB  12/13/22  Yes [provider]  albuterol (VENTOLIN HFA) 108 (90 Base) MCG/ACT inhaler Inhale 2 puffs into the lungs every 6 (six) hours as needed for wheezing or shortness of breath. 05/21/22   Horton, Barbette Hair, MD  alum & mag hydroxide-simeth (MAALOX MAX) 400-400-40 MG/5ML suspension Take 15 mLs by mouth every 6 (six) hours as needed for indigestion. 06/23/22   Annalina Needles, MD  amLODipine (NORVASC) 5 MG tablet Take 5 mg by mouth daily.    [provider]  dicyclomine (BENTYL) 20 MG tablet Take 1 tablet (20 mg total) by mouth 2 (two) times daily. 06/23/22   Shelena Castelluccio, MD  hydrochlorothiazide (HYDRODIURIL) 12.5 MG tablet Take 12.5 mg by mouth daily. 03/02/22   [provider]  linaclotide Rolan Lipa) 290 MCG CAPS capsule Take 1 capsule (290 mcg total) by mouth daily before breakfast. 04/20/22 04/20/23  [name], DO  pantoprazole (PROTONIX) 40 MG tablet Take 1 tablet (  40 mg total) by mouth 2 (two) times daily before a meal. Patient not taking: Reported on 06/23/2022 06/22/22   Erenest Rasher, PA-C  simethicone [facility]) 0000000 MG chewable tablet Chew 1 tablet after each meal and at bedtime if needed 01/12/22   [name], DO                                                                                                                                     Past Surgical History Past Surgical History:  Procedure Laterality Date   ABDOMINAL AORTOGRAM W/LOWER EXTREMITY N/A 10/26/2017   Procedure: ABDOMINAL AORTOGRAM W/LOWER EXTREMITY;  Surgeon: Elam Dutch, MD;  Location: [facility];  Service: Cardiovascular: 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  Normal right aortoiliac vessels.  Three-vessel runoff bilaterally.  (Sluggish flow of the left side due to occlusion.   ABDOMINAL HYSTERECTOMY     BALLOON DILATION N/A 10/05/2020   Procedure: BALLOON DILATION;  Surgeon: [name], DO;  [facility];  Service: Endoscopy;  Laterality: N/A;   BALLOON DILATION N/A 07/17/2022   Procedure: BALLOON DILATION;  Surgeon: [name], DO;  [facility];  Service: Endoscopy;  Laterality: N/A;   BIOPSY  10/05/2020   Procedure: BIOPSY;  Surgeon: [name], DO;  [facility];  Service: Endoscopy;;   BIOPSY  07/17/2022   Procedure: BIOPSY;  Surgeon: [name], DO;  [facility];  Service: Endoscopy;;   CHOLECYSTECTOMY     COLONOSCOPY  2007   Dr. Laural Golden: small internal hemorrhoids, otherwise normal. exam could be compromised due to quality of prep   COLONOSCOPY N/A 02/15/2018   Dr. Oneida Alar: 2 simple adenomas, external and internal hemorrhoids. Next colonoscopy 2024-2026   ESOPHAGOGASTRODUODENOSCOPY N/A 05/09/2018   Procedure: ESOPHAGOGASTRODUODENOSCOPY (EGD);  Surgeon: Danie Binder, MD; web in the distal esophagus s/p dilated, mild gastritis biopsied, normal examined duodenum. Pathology with gastritis, negative for H. Pylori.     ESOPHAGOGASTRODUODENOSCOPY (EGD) WITH PROPOFOL N/A 10/05/2020   Surgeon: [name], DO; small hiatal hernia, grade B reflux esophagitis, benign-appearing esophageal stenosis dilated, gastritis biopsied- mild nonspecific reactive gastropathy, negative for H. pylori.    ESOPHAGOGASTRODUODENOSCOPY (EGD) WITH PROPOFOL N/A 07/17/2022   Procedure: ESOPHAGOGASTRODUODENOSCOPY (EGD) WITH PROPOFOL;  Surgeon: [name], DO;  [facility];  Service: Endoscopy;  Laterality: N/A;  9:45am   FEMORAL-FEMORAL BYPASS GRAFT Bilateral 10/21/2018   Procedure: RIGHT TO LEFT FEMORAL ARTERY BYPASS GRAFT;  Surgeon: Elam Dutch, MD;  Location: [facility];  Service: Vascular;  Laterality: Bilateral;   LOWER EXTREMITY ANGIOGRAPHY N/A 09/27/2018   Procedure: LOWER EXTREMITY ANGIOGRAPHY;  Surgeon: Elam Dutch, MD;  Location: [facility];  Service: Cardiovascular;  Laterality: N/A;   NM MYOVIEW LTD  12/2014   [facility]: Normal Myoview.  Nonischemic.  TRANSTHORACIC ECHOCARDIOGRAM  12/2011   EF 55 to 60%.  No regional wall motion normality.  GRII DD.  No significant valve disease.   VASCULAR SURGERY     Family History Family History  Problem Relation Age of Onset   Hypertension Mother    Peripheral Artery Disease Brother    Colon cancer Neg Hx    Colon polyps Neg Hx     Social History Social History   Tobacco Use   Smoking status: Every Day    Packs/day: 0.50    Years: 40.00    Total pack years: 20.00    Types: Cigarettes   Smokeless tobacco: Never  Vaping Use   Vaping Use: Never used  Substance Use Topics   Alcohol use: No    Alcohol/week: 0.0 standard drinks of alcohol   Drug use: No   Allergies Patient has no known allergies.  Review of Systems Review of Systems  Neurological:  Positive for syncope.    Physical Exam Vital Signs  I have reviewed the triage vital signs BP 121/74 (BP Location: Right Arm)   Pulse 66   Temp 98.2 F (36.8 C) (Oral)   Resp 19   Ht 5' 2"$  (1.575 m)   Wt 60.8 kg   SpO2 97%   BMI 24.51 kg/m   Physical Exam Vitals and nursing note reviewed.  Constitutional:      General: She is not in acute distress.    Appearance: She is well-developed.  HENT:     Head: Normocephalic and  atraumatic.  Eyes:     Conjunctiva/sclera: Conjunctivae normal.  Cardiovascular:     Rate and Rhythm: Normal rate and regular rhythm.     Heart sounds: No murmur heard. Pulmonary:     Effort: Pulmonary effort is normal. No respiratory distress.     Breath sounds: Normal breath sounds.  Abdominal:     Palpations: Abdomen is soft.     Tenderness: There is no abdominal tenderness.  Musculoskeletal:        General: No swelling.     Cervical back: Neck supple.  Skin:    General: Skin is warm and dry.     Capillary Refill: Capillary refill takes less than 2 seconds.  Neurological:     Mental Status: She is alert.  Psychiatric:        Mood and Affect: Mood normal.     ED Results and Treatments Labs (all labs ordered are listed, but only abnormal results are displayed) Labs Reviewed  BASIC METABOLIC PANEL - Abnormal; Notable for the following components:      Result Value   Creatinine, Ser 1.24 (*)    Calcium 8.6 (*)    GFR, Estimated 49 (*)    All other components within normal limits  CBC  URINALYSIS, ROUTINE W REFLEX MICROSCOPIC  Radiology No results found.  Pertinent labs & imaging results that were available during my care of the patient were reviewed by me and considered in my medical decision making (see MDM for details).  Medications Ordered in ED Medications  lactated ringers bolus 1,000 mL (has no administration in time range)                                                                                                                                     Procedures Procedures  (including critical care time)  Medical Decision Making / ED Course   This patient presents to the ED for concern of presyncope, this involves an extensive number of treatment options, and is a complaint that carries with it a high risk of complications and  morbidity.  The differential diagnosis includes orthostatic syncope, cardiogenic syncope, vasovagal syncope, electrolyte abnormality, dehydration  MDM: Patient seen the emergency room for evaluation of syncope.  Physical exam is unremarkable.  Laboratory evaluation with a mild creatinine elevation 1.24 but is otherwise unremarkable.  Urinalysis unremarkable.  ECG with no evidence of dysrhythmia or ischemia.  Patient fluid resuscitated and on reevaluation states that her symptoms have completely resolved and she is not feeling dizzy or presyncopal here in the emergency department.  The patient is symptom-free with an overall reassuring workup in the emergency department, she will be discharged with outpatient follow-up and encouraged to increase her water intake as the syncopal events appear to be surrounding events of dehydration.  At this time patient does not meet inpatient criteria for admission and she is safe for discharge with outpatient follow-up   Additional history obtained:  -External records from outside source obtained and reviewed including: Chart review including previous notes, labs, imaging, consultation notes   Lab Tests: -I ordered, reviewed, and interpreted labs.   The pertinent results include:   Labs Reviewed  BASIC METABOLIC PANEL - Abnormal; Notable for the following components:      Result Value   Creatinine, Ser 1.24 (*)    Calcium 8.6 (*)    GFR, Estimated 49 (*)    All other components within normal limits  CBC  URINALYSIS, ROUTINE W REFLEX MICROSCOPIC      EKG   EKG Interpretation  Date/Time:  Monday February 05 2023 11:34:29 EST Ventricular Rate:  74 PR Interval:  162 QRS Duration: 70 QT Interval:  378 QTC Calculation: 419 R Axis:   63 Text Interpretation: Normal sinus rhythm Abnormal QRS-T angle, consider primary T wave abnormality Abnormal ECG When compared with ECG of 10-Nov-2022 00:09, No significant change since last tracing Confirmed by Aletta Edouard [phone]) on 02/05/2023 11:43:50 AM          Medicines ordered and prescription drug management: Meds ordered this encounter  Medications   lactated ringers bolus 1,000 mL    -I have reviewed the patients home medicines and have made adjustments as needed  Critical  interventions none     Cardiac Monitoring: The patient was maintained on a cardiac monitor.  I personally viewed and interpreted the cardiac monitored which showed an underlying rhythm of: NSR  Social Determinants of Health:  Factors impacting patients care include: none   Reevaluation: After the interventions noted above, I reevaluated the patient and found that they have :improved  Co morbidities that complicate the patient evaluation  Past Medical History:  Diagnosis Date   Chronic back pain    Constipation    Constipation    COPD (chronic obstructive pulmonary disease) (HCC)    DVT (deep venous thrombosis) (HCC)    GERD (gastroesophageal reflux disease)    Headache    History of kidney stones    Hyperlipidemia    Hypertension       Dispostion: I considered admission for this patient, but at this time she does not meet inpatient criteria for admission and she is safe for discharge with outpatient follow-up     Final Clinical Impression(s) / ED Diagnoses Final diagnoses:  None     @PCDICTATION$ @    Krishika Bugge, Debe Coder, MD 02/06/23 [id number]

## 2023-02-09 ENCOUNTER — Telehealth: Payer: Self-pay

## 2023-02-09 NOTE — Telephone Encounter (Signed)
        Patient  visited [place] on 2/19   Telephone encounter attempt :  1st   Unable to leave a message.   [facility] [phone] [address], [place], [place] [address] Phone: [phone] Email: [email or web address]

## 2023-02-12 ENCOUNTER — Telehealth: Payer: Self-pay

## 2023-02-12 NOTE — Telephone Encounter (Signed)
     Patient  visit on 2/19  at [facility]   Have you been able to follow up with your primary care physician? Yes   The patient was or was not able to obtain any needed medicine or equipment. Yes   Are there diet recommendations that you are having difficulty following? Na   Patient expresses understanding of discharge instructions and education provided has no other needs at this time. Yes     Virginia Washington [facility], [place] [phone] [address], [place], [place] [address] Phone: [phone] Email: [email or web address]    

## 2023-03-12 DIAGNOSIS — R55 Syncope and collapse: Secondary | ICD-10-CM | POA: Diagnosis not present

## 2023-03-12 DIAGNOSIS — I700 Atherosclerosis of aorta: Secondary | ICD-10-CM | POA: Diagnosis not present

## 2023-03-12 DIAGNOSIS — R002 Palpitations: Secondary | ICD-10-CM | POA: Diagnosis not present

## 2023-03-12 DIAGNOSIS — R69 Illness, unspecified: Secondary | ICD-10-CM | POA: Diagnosis not present

## 2023-03-12 DIAGNOSIS — I10 Essential (primary) hypertension: Secondary | ICD-10-CM | POA: Diagnosis not present

## 2023-03-27 DIAGNOSIS — I10 Essential (primary) hypertension: Secondary | ICD-10-CM | POA: Diagnosis not present

## 2023-03-27 DIAGNOSIS — R55 Syncope and collapse: Secondary | ICD-10-CM | POA: Diagnosis not present

## 2023-03-27 DIAGNOSIS — J449 Chronic obstructive pulmonary disease, unspecified: Secondary | ICD-10-CM | POA: Diagnosis not present

## 2023-03-28 ENCOUNTER — Ambulatory Visit (HOSPITAL_COMMUNITY)
Admission: RE | Admit: 2023-03-28 | Discharge: 2023-03-28 | Disposition: A | Discharge status: Home / Self Care | Payer: Medicare HMO | Source: Ambulatory Visit | Attending: Internal Medicine | Admitting: Internal Medicine

## 2023-03-28 DIAGNOSIS — I10 Essential (primary) hypertension: Secondary | ICD-10-CM | POA: Diagnosis not present

## 2023-03-28 DIAGNOSIS — R55 Syncope and collapse: Secondary | ICD-10-CM | POA: Diagnosis not present

## 2023-03-28 DIAGNOSIS — R002 Palpitations: Secondary | ICD-10-CM | POA: Insufficient documentation

## 2023-04-12 DIAGNOSIS — I10 Essential (primary) hypertension: Secondary | ICD-10-CM | POA: Diagnosis not present

## 2023-04-12 DIAGNOSIS — J440 Chronic obstructive pulmonary disease with acute lower respiratory infection: Secondary | ICD-10-CM | POA: Diagnosis not present

## 2023-04-18 DIAGNOSIS — K219 Gastro-esophageal reflux disease without esophagitis: Secondary | ICD-10-CM | POA: Diagnosis not present

## 2023-04-18 DIAGNOSIS — I10 Essential (primary) hypertension: Secondary | ICD-10-CM | POA: Diagnosis not present

## 2023-04-18 DIAGNOSIS — N3946 Mixed incontinence: Secondary | ICD-10-CM | POA: Diagnosis not present

## 2023-04-18 DIAGNOSIS — Z87891 Personal history of nicotine dependence: Secondary | ICD-10-CM | POA: Diagnosis not present

## 2023-04-18 DIAGNOSIS — G629 Polyneuropathy, unspecified: Secondary | ICD-10-CM | POA: Diagnosis not present

## 2023-04-18 DIAGNOSIS — E785 Hyperlipidemia, unspecified: Secondary | ICD-10-CM | POA: Diagnosis not present

## 2023-04-18 DIAGNOSIS — Z7951 Long term (current) use of inhaled steroids: Secondary | ICD-10-CM | POA: Diagnosis not present

## 2023-04-18 DIAGNOSIS — Z008 Encounter for other general examination: Secondary | ICD-10-CM | POA: Diagnosis not present

## 2023-04-18 DIAGNOSIS — K5900 Constipation, unspecified: Secondary | ICD-10-CM | POA: Diagnosis not present

## 2023-04-18 DIAGNOSIS — J439 Emphysema, unspecified: Secondary | ICD-10-CM | POA: Diagnosis not present

## 2023-04-22 ENCOUNTER — Ambulatory Visit (INDEPENDENT_AMBULATORY_CARE_PROVIDER_SITE_OTHER): Payer: Medicare HMO

## 2023-04-22 ENCOUNTER — Emergency Department (HOSPITAL_COMMUNITY)
Admission: EM | Admit: 2023-04-22 | Discharge: 2023-04-22 | Discharge status: Against Medical Advice | Payer: Medicare HMO | Attending: Emergency Medicine | Admitting: Emergency Medicine

## 2023-04-22 ENCOUNTER — Other Ambulatory Visit: Payer: Self-pay

## 2023-04-22 ENCOUNTER — Ambulatory Visit
Admission: EM | Admit: 2023-04-22 | Discharge: 2023-04-22 | Disposition: A | Discharge status: Home / Self Care | Payer: Medicare HMO | Attending: Physician Assistant | Admitting: Physician Assistant

## 2023-04-22 DIAGNOSIS — R059 Cough, unspecified: Secondary | ICD-10-CM | POA: Insufficient documentation

## 2023-04-22 DIAGNOSIS — J441 Chronic obstructive pulmonary disease with (acute) exacerbation: Secondary | ICD-10-CM

## 2023-04-22 DIAGNOSIS — Z1152 Encounter for screening for COVID-19: Secondary | ICD-10-CM | POA: Diagnosis not present

## 2023-04-22 DIAGNOSIS — Z5321 Procedure and treatment not carried out due to patient leaving prior to being seen by health care provider: Secondary | ICD-10-CM | POA: Insufficient documentation

## 2023-04-22 DIAGNOSIS — M791 Myalgia, unspecified site: Secondary | ICD-10-CM | POA: Insufficient documentation

## 2023-04-22 LAB — RESP PANEL BY RT-PCR (RSV, FLU A&B, COVID)  RVPGX2
Influenza A by PCR: NEGATIVE
Influenza B by PCR: NEGATIVE
Resp Syncytial Virus by PCR: NEGATIVE
SARS Coronavirus 2 by RT PCR: NEGATIVE

## 2023-04-22 MED ORDER — PREDNISONE 20 MG PO TABS: 40.0000 mg | ORAL_TABLET | Freq: Every day | ORAL | 0 refills | Status: AC

## 2023-04-22 MED ORDER — CEFDINIR 300 MG PO CAPS: 300.0000 mg | ORAL_CAPSULE | Freq: Two times a day (BID) | ORAL | 0 refills | Status: DC

## 2023-04-22 NOTE — ED Triage Notes (Signed)
C/o cough and generalized body aches x2 days.

## 2023-04-22 NOTE — ED Provider Notes (Signed)
[facility]    CSN: [id number] Arrival date & time: 04/22/23  1240      History   Chief Complaint Chief Complaint  Patient presents with   Generalized Body Aches   Cough    HPI Virginia Washington is a 67 y.o. female.   Patient presents today with a several day history of cough, body aches, chills, shortness of breath.  She denies any measurable fever, chest pain, nausea, vomiting, diarrhea.  Denies any known sick contacts.  She initially went to the emergency room earlier today and had a negative RSV/COVID/flu swab.  She did not wait to be seen by provider and then presented to our clinic.  She has not been taking any over-the-counter medication for symptom management.  She does have a history of COPD and has been using her albuterol more frequently since symptoms began.  She is compliant with her maintenance medication (Trelegy).  She denies any recent antibiotics or steroids.  Reports she is having difficulty sleeping at night prompting evaluation.  Denies any history of diabetes.    Past Medical History:  Diagnosis Date   Chronic back pain    Constipation    Constipation    COPD (chronic obstructive pulmonary disease) (HCC)    DVT (deep venous thrombosis) (HCC)    GERD (gastroesophageal reflux disease)    Headache    History of kidney stones    Hyperlipidemia    Hypertension     Patient Active Problem List   Diagnosis Date Noted   IBS (irritable bowel syndrome) 01/12/2022   Abdominal pain, epigastric 08/11/2020   Early satiety 08/11/2020   Intractable abdominal pain 05/30/2019   Partial bowel obstruction (HCC) 05/30/2019   Preop cardiovascular exam 09/23/2018   PAD (peripheral artery disease) (HCC) -left iliac occlusion 09/23/2018   Dysphagia    SBO (small bowel obstruction) (HCC) 04/29/2018   GERD (gastroesophageal reflux disease) 04/28/2018   Hypokalemia 04/28/2018   Pulmonary nodule 04/28/2018   Hypernatremia 04/28/2018   Enteritis 04/28/2018    Encounter for screening colonoscopy 01/16/2018   Abdominal pain 11/16/2017   Constipation 11/16/2017   Partial small bowel obstruction (HCC) 08/16/2016   Hyperlipidemia 08/16/2016   Chest pain 12/12/2014   Hypertension 12/12/2014   Tobacco abuse 10/29/2011    Past Surgical History:  Procedure Laterality Date   ABDOMINAL AORTOGRAM W/LOWER EXTREMITY N/A 10/26/2017   Procedure: ABDOMINAL AORTOGRAM W/LOWER EXTREMITY;  Surgeon: Sherren Kerns, MD;  Location: [facility];  Service: Cardiovascular: 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  Normal right aortoiliac vessels.  Three-vessel runoff bilaterally.  (Sluggish flow of the left side due to occlusion.   ABDOMINAL HYSTERECTOMY     BALLOON DILATION N/A 10/05/2020   Procedure: BALLOON DILATION;  Surgeon: Lanelle Bal, DO;  Location: [facility];  Service: Endoscopy;  Laterality: N/A;   BALLOON DILATION N/A 07/17/2022   Procedure: BALLOON DILATION;  Surgeon: Lanelle Bal, DO;  Location: [facility];  Service: Endoscopy;  Laterality: N/A;   BIOPSY  10/05/2020   Procedure: BIOPSY;  Surgeon: Lanelle Bal, DO;  Location: [facility];  Service: Endoscopy;;   BIOPSY  07/17/2022   Procedure: BIOPSY;  Surgeon: Lanelle Bal, DO;  Location: [facility];  Service: Endoscopy;;   CHOLECYSTECTOMY     COLONOSCOPY  2007   Dr. Karilyn Cota: small internal hemorrhoids, otherwise normal. exam could be compromised due to quality of prep   COLONOSCOPY  N/A 02/15/2018   Dr. Darrick Penna: 2 simple adenomas, external and internal hemorrhoids. Next colonoscopy 2024-2026   ESOPHAGOGASTRODUODENOSCOPY N/A 05/09/2018   Procedure: ESOPHAGOGASTRODUODENOSCOPY (EGD);  Surgeon: West Bali, MD; web in the distal esophagus s/p dilated, mild gastritis biopsied, normal examined duodenum. Pathology with gastritis, negative for H. Pylori.     ESOPHAGOGASTRODUODENOSCOPY (EGD) WITH PROPOFOL N/A  10/05/2020   Surgeon: Lanelle Bal, DO; small hiatal hernia, grade B reflux esophagitis, benign-appearing esophageal stenosis dilated, gastritis biopsied- mild nonspecific reactive gastropathy, negative for H. pylori.   ESOPHAGOGASTRODUODENOSCOPY (EGD) WITH PROPOFOL N/A 07/17/2022   Procedure: ESOPHAGOGASTRODUODENOSCOPY (EGD) WITH PROPOFOL;  Surgeon: Lanelle Bal, DO;  Location: [facility];  Service: Endoscopy;  Laterality: N/A;  9:45am   FEMORAL-FEMORAL BYPASS GRAFT Bilateral 10/21/2018   Procedure: RIGHT TO LEFT FEMORAL ARTERY BYPASS GRAFT;  Surgeon: Sherren Kerns, MD;  Location: [facility] OR;  Service: Vascular;  Laterality: Bilateral;   LOWER EXTREMITY ANGIOGRAPHY N/A 09/27/2018   Procedure: LOWER EXTREMITY ANGIOGRAPHY;  Surgeon: Sherren Kerns, MD;  Location: [facility];  Service: Cardiovascular;  Laterality: N/A;   NM MYOVIEW LTD  12/2014   [facility]: Normal Myoview.  Nonischemic.   TRANSTHORACIC ECHOCARDIOGRAM  12/2011   EF 55 to 60%.  No regional wall motion normality.  GRII DD.  No significant valve disease.   VASCULAR SURGERY      OB History     Gravida      Para      Term      Preterm      AB      Living  3      SAB      IAB      Ectopic      Multiple      Live Births               Home Medications    Prior to Admission medications   Medication Sig Start Date End Date Taking? Authorizing Provider  cefdinir (OMNICEF) 300 MG capsule Take 1 capsule (300 mg total) by mouth 2 (two) times daily. 04/22/23  Yes Drue Harr K, PA-C  predniSONE (DELTASONE) 20 MG tablet Take 2 tablets (40 mg total) by mouth daily for 5 days. 04/22/23 04/27/23 Yes Evadne Ose K, PA-C  albuterol (VENTOLIN HFA) 108 (90 Base) MCG/ACT inhaler Inhale 2 puffs into the lungs every 6 (six) hours as needed for wheezing or shortness of breath. 05/21/22   Horton, Mayer Masker, MD  alum & mag hydroxide-simeth (MAALOX MAX) 400-400-40 MG/5ML suspension Take 15 mLs by  mouth every 6 (six) hours as needed for indigestion. 06/23/22   Kommor, Madison, MD  amLODipine (NORVASC) 5 MG tablet Take 5 mg by mouth daily.    [provider]  atorvastatin (LIPITOR) 20 MG tablet Take 20 mg by mouth daily. 10/03/22   [provider]  dicyclomine (BENTYL) 20 MG tablet Take 1 tablet (20 mg total) by mouth 2 (two) times daily. 06/23/22   Kommor, Wyn Forster, MD  gabapentin (NEURONTIN) 100 MG capsule  12/13/22   [provider]  hydrochlorothiazide (HYDRODIURIL) 12.5 MG tablet Take 12.5 mg by mouth daily. 03/02/22   [provider]  ipratropium-albuterol (DUONEB) 0.5-2.5 (3) MG/3ML SOLN SMARTSIG:1 Vial(s) Via Nebulizer Every 4-6 Hours PRN 09/08/22   [provider]  linaclotide (LINZESS) 290 MCG CAPS capsule Take 1 capsule (290 mcg total) by mouth daily before breakfast. 04/20/22 04/20/23  Lanelle Bal, DO  pantoprazole (PROTONIX) 40 MG  tablet Take 1 tablet (40 mg total) by mouth 2 (two) times daily before a meal. Patient not taking: Reported on 06/23/2022 06/22/22   Letta Median, PA-C  simethicone (MYLICON) 125 MG chewable tablet Chew 1 tablet after each meal and at bedtime if needed 01/12/22   Lanelle Bal, DO  TRELEGY ELLIPTA 100-62.5-25 MCG/ACT AEPB  12/13/22   [provider]    Family History Family History  Problem Relation Age of Onset   Hypertension Mother    Peripheral Artery Disease Brother    Colon cancer Neg Hx    Colon polyps Neg Hx     Social History Social History   Tobacco Use   Smoking status: Every Day    Packs/day: 0.50    Years: 40.00    Additional pack years: 0.00    Total pack years: 20.00    Types: Cigarettes   Smokeless tobacco: Never  Vaping Use   Vaping Use: Never used  Substance Use Topics   Alcohol use: No    Alcohol/week: 0.0 standard drinks of alcohol   Drug use: No     Allergies   Patient has no known allergies.   Review of Systems Review of Systems  Constitutional:   Positive for activity change and chills. Negative for appetite change, fatigue and fever.  HENT:  Negative for congestion, sinus pressure, sneezing and sore throat.   Respiratory:  Positive for cough and shortness of breath.   Cardiovascular:  Negative for chest pain.  Gastrointestinal:  Negative for abdominal pain, diarrhea, nausea and vomiting.     Physical Exam Triage Vital Signs ED Triage Vitals  Enc Vitals Group     BP 04/22/23 1313 104/68     Pulse Rate 04/22/23 1313 81     Resp 04/22/23 1313 19     Temp 04/22/23 1313 97.9 F (36.6 C)     Temp Source 04/22/23 1313 Oral     SpO2 04/22/23 1313 95 %     Weight --      Height --      Head Circumference --      Peak Flow --      Pain Score 04/22/23 1318 8     Pain Loc --      Pain Edu? --      Excl. in GC? --    No data found.  Updated Vital Signs BP 104/68 (BP Location: Right Arm)   Pulse 81   Temp 97.9 F (36.6 C) (Oral)   Resp 19   SpO2 95%   Visual Acuity Right Eye Distance:   Left Eye Distance:   Bilateral Distance:    Right Eye Near:   Left Eye Near:    Bilateral Near:     Physical Exam Vitals reviewed.  Constitutional:      General: She is awake. She is not in acute distress.    Appearance: Normal appearance. She is well-developed. She is not ill-appearing.     Comments: Very pleasant female appears stated age in no acute distress sitting comfortably in exam room  HENT:     Head: Normocephalic and atraumatic.     Right Ear: Tympanic membrane, ear canal and external ear normal. Tympanic membrane is not erythematous or bulging.     Left Ear: Tympanic membrane, ear canal and external ear normal. Tympanic membrane is not erythematous or bulging.     Nose:     Right Sinus: No maxillary sinus tenderness or frontal sinus tenderness.  Left Sinus: No maxillary sinus tenderness or frontal sinus tenderness.     Mouth/Throat:     Dentition: Abnormal dentition.     Pharynx: Uvula midline. No oropharyngeal  exudate or posterior oropharyngeal erythema.  Cardiovascular:     Rate and Rhythm: Normal rate and regular rhythm.     Heart sounds: Normal heart sounds, S1 normal and S2 normal. No murmur heard. Pulmonary:     Effort: Pulmonary effort is normal.     Breath sounds: Examination of the right-lower field reveals decreased breath sounds. Examination of the left-lower field reveals decreased breath sounds. Decreased breath sounds present. No wheezing, rhonchi or rales.  Psychiatric:        Behavior: Behavior is cooperative.      UC Treatments / Results  Labs (all labs ordered are listed, but only abnormal results are displayed) Labs Reviewed - No data to display  EKG   Radiology DG Chest 2 View  Result Date: 04/22/2023 CLINICAL DATA:  Cough 3 days. EXAM: CHEST - 2 VIEW COMPARISON:  11/10/2022.  CT, 02/17/2021. FINDINGS: Cardiac silhouette normal in size and configuration. Normal mediastinal and hilar contours. Clear lungs.  No pleural effusion or pneumothorax. Skeletal structures are intact. IMPRESSION: No active cardiopulmonary disease. Electronically Signed   By: Amie Portland M.D.   On: 04/22/2023 13:46    Procedures Procedures (including critical care time)  Medications Ordered in UC Medications - No data to display  Initial Impression / Assessment and Plan / UC Course  I have reviewed the triage vital signs and the nursing notes.  Pertinent labs & imaging results that were available during my care of the patient were reviewed by me and considered in my medical decision making (see chart for details).     Patient is well-appearing, afebrile, nontoxic, nontachycardic.  She has already had negative viral testing.  Given her worsening cough with increased pain production concern for COPD exacerbation.  Chest x-ray was obtained that was negative for acute cardiopulmonary disease.  Will start cefdinir twice daily for 7 days as well as prednisone 40 mg for 5 days to cover for COPD  exacerbation.  Discussed that she is not to take NSAIDs with prednisone due to risk of GI bleeding.  She can use over-the-counter medication including Mucinex, Flonase, Tylenol for additional symptom relief.  Recommended that she rest and drink plenty of fluid.  She is to follow-up with her primary care next week.  Discussed that if her symptoms or not improving within a week or if she has any worsening symptoms including worsening cough, shortness of breath, fever, nausea, vomiting, weakness she needs to be seen immediately.  Strict return precautions given.  Final Clinical Impressions(s) / UC Diagnoses   Final diagnoses:  COPD exacerbation [facility])     Discharge Instructions      We are treating you for a COPD exacerbation.  Please start Omnicef (antibiotic) twice daily for 7 days.  Start prednisone 40 mg for 5 days.  Do not take NSAIDs with this medication including aspirin, ibuprofen/Advil, naproxen/Aleve.  You can use over-the-counter medication including Tylenol, Flonase, Mucinex.  Make sure that you are resting and drinking plenty of fluid.  If your symptoms or not improving within a week please return for reevaluation.  If you have any worsening symptoms including worsening cough, shortness of breath, fever, nausea, vomiting you need to be seen immediately.     ED Prescriptions     Medication Sig Dispense Auth. Provider   predniSONE (DELTASONE) 20  MG tablet Take 2 tablets (40 mg total) by mouth daily for 5 days. 10 tablet Laparis Durrett K, PA-C   cefdinir (OMNICEF) 300 MG capsule Take 1 capsule (300 mg total) by mouth 2 (two) times daily. 14 capsule Gurinder Toral K, PA-C      PDMP not reviewed this encounter.   Jeani Hawking, PA-C 04/22/23 1354

## 2023-04-22 NOTE — ED Triage Notes (Signed)
Pt c/o cough and body aches that began Friday.   Home interventions: none

## 2023-04-22 NOTE — Discharge Instructions (Signed)
We are treating you for a COPD exacerbation.  Please start Omnicef (antibiotic) twice daily for 7 days.  Start prednisone 40 mg for 5 days.  Do not take NSAIDs with this medication including aspirin, ibuprofen/Advil, naproxen/Aleve.  You can use over-the-counter medication including Tylenol, Flonase, Mucinex.  Make sure that you are resting and drinking plenty of fluid.  If your symptoms or not improving within a week please return for reevaluation.  If you have any worsening symptoms including worsening cough, shortness of breath, fever, nausea, vomiting you need to be seen immediately.

## 2023-05-12 DIAGNOSIS — J440 Chronic obstructive pulmonary disease with acute lower respiratory infection: Secondary | ICD-10-CM | POA: Diagnosis not present

## 2023-05-12 DIAGNOSIS — I10 Essential (primary) hypertension: Secondary | ICD-10-CM | POA: Diagnosis not present

## 2023-05-21 ENCOUNTER — Emergency Department (HOSPITAL_COMMUNITY): Payer: Medicare HMO

## 2023-05-21 ENCOUNTER — Other Ambulatory Visit: Payer: Self-pay

## 2023-05-21 ENCOUNTER — Encounter (HOSPITAL_COMMUNITY): Payer: Self-pay | Admitting: Emergency Medicine

## 2023-05-21 ENCOUNTER — Emergency Department (HOSPITAL_COMMUNITY)
Admission: EM | Admit: 2023-05-21 | Discharge: 2023-05-21 | Disposition: A | Discharge status: Home / Self Care | Payer: Medicare HMO | Attending: Emergency Medicine | Admitting: Emergency Medicine

## 2023-05-21 DIAGNOSIS — R310 Gross hematuria: Secondary | ICD-10-CM | POA: Diagnosis not present

## 2023-05-21 DIAGNOSIS — R319 Hematuria, unspecified: Secondary | ICD-10-CM | POA: Diagnosis not present

## 2023-05-21 DIAGNOSIS — K922 Gastrointestinal hemorrhage, unspecified: Secondary | ICD-10-CM | POA: Diagnosis not present

## 2023-05-21 LAB — URINALYSIS, ROUTINE W REFLEX MICROSCOPIC
Bilirubin Urine: NEGATIVE
Glucose, UA: NEGATIVE mg/dL
Hgb urine dipstick: NEGATIVE
Ketones, ur: 5 mg/dL — AB
Leukocytes,Ua: NEGATIVE
Nitrite: NEGATIVE
Protein, ur: NEGATIVE mg/dL
Specific Gravity, Urine: 1.024 (ref 1.005–1.030)
pH: 6 (ref 5.0–8.0)

## 2023-05-21 LAB — POC OCCULT BLOOD, ED: Fecal Occult Blood: POSITIVE

## 2023-05-21 LAB — BASIC METABOLIC PANEL
Anion gap: 8 (ref 5–15)
BUN: 12 mg/dL (ref 8–23)
CO2: 25 mmol/L (ref 22–32)
Calcium: 8.9 mg/dL (ref 8.9–10.3)
Chloride: 108 mmol/L (ref 98–111)
Creatinine, Ser: 1.3 mg/dL — ABNORMAL HIGH (ref 0.44–1.00)
GFR, Estimated: 46 mL/min — ABNORMAL LOW (ref >60)
Glucose, Bld: 126 mg/dL — ABNORMAL HIGH (ref 70–99)
Potassium: 3.4 mmol/L — ABNORMAL LOW (ref 3.5–5.1)
Sodium: 141 mmol/L (ref 135–145)

## 2023-05-21 LAB — CBC
HCT: 44.2 % (ref 36.0–46.0)
Hemoglobin: 14.3 g/dL (ref 12.0–15.0)
MCH: 28.5 pg (ref 26.0–34.0)
MCHC: 32.4 g/dL (ref 30.0–36.0)
MCV: 88.2 fL (ref 80.0–100.0)
Platelets: 228 10*3/uL (ref 150–400)
RBC: 5.01 MIL/uL (ref 3.87–5.11)
RDW: 15.2 % (ref 11.5–15.5)
WBC: 7.6 10*3/uL (ref 4.0–10.5)
nRBC: 0 % (ref 0.0–0.2)

## 2023-05-21 NOTE — ED Triage Notes (Signed)
Pt sent by PCP for eval of frankly bloody hematuria since this morning with bladder pain. No burning urination but pt has frequency and urgency. Bladder pain rated 8/10

## 2023-05-22 NOTE — ED Provider Notes (Signed)
[place] [facility] AT [facility] Provider Note   CSN: [id number] Arrival date & time: 05/21/23  1800     History  Chief Complaint  Patient presents with   Hematuria    Kayse Deanda Drilling is a 67 y.o. female.   Hematuria  Patient presented for reported blood in her urine.  States she has had since this morning.  States when she went to the bathroom she had blood in the urine.  Denies initially other cause such as vaginal bleeding or rectal bleeding.  Not on blood thinners.  However later after results patient stated it was rectal bleeding not urine blood.       Home Medications Prior to Admission medications   Medication Sig Start Date End Date Taking? Authorizing Provider  albuterol (VENTOLIN HFA) 108 (90 Base) MCG/ACT inhaler Inhale 2 puffs into the lungs every 6 (six) hours as needed for wheezing or shortness of breath. 05/21/22   Horton, Mayer Masker, MD  alum & mag hydroxide-simeth (MAALOX MAX) 400-400-40 MG/5ML suspension Take 15 mLs by mouth every 6 (six) hours as needed for indigestion. 06/23/22   Kommor, Madison, MD  amLODipine (NORVASC) 5 MG tablet Take 5 mg by mouth daily.    [provider]  atorvastatin (LIPITOR) 20 MG tablet Take 20 mg by mouth daily. 10/03/22   [provider]  cefdinir (OMNICEF) 300 MG capsule Take 1 capsule (300 mg total) by mouth 2 (two) times daily. 04/22/23   Raspet, Noberto Retort, PA-C  dicyclomine (BENTYL) 20 MG tablet Take 1 tablet (20 mg total) by mouth 2 (two) times daily. 06/23/22   Kommor, Wyn Forster, MD  gabapentin (NEURONTIN) 100 MG capsule  12/13/22   [provider]  hydrochlorothiazide (HYDRODIURIL) 12.5 MG tablet Take 12.5 mg by mouth daily. 03/02/22   [provider]  ipratropium-albuterol (DUONEB) 0.5-2.5 (3) MG/3ML SOLN SMARTSIG:1 Vial(s) Via Nebulizer Every 4-6 Hours PRN 09/08/22   [provider]  linaclotide (LINZESS) 290 MCG CAPS capsule Take 1 capsule (290 mcg total) by mouth  daily before breakfast. 04/20/22 04/20/23  Lanelle Bal, DO  pantoprazole (PROTONIX) 40 MG tablet Take 1 tablet (40 mg total) by mouth 2 (two) times daily before a meal. Patient not taking: Reported on 06/23/2022 06/22/22   Letta Median, PA-C  simethicone (MYLICON) 125 MG chewable tablet Chew 1 tablet after each meal and at bedtime if needed 01/12/22   Lanelle Bal, DO  TRELEGY ELLIPTA 100-62.5-25 MCG/ACT AEPB  12/13/22   [provider]      Allergies    Patient has no known allergies.    Review of Systems   Review of Systems  Genitourinary:  Positive for hematuria.    Physical Exam Updated Vital Signs BP 125/73   Pulse 72   Temp 98.6 F (37 C) (Oral)   Resp 16   Ht 5\' 2"  (1.575 m)   Wt 61.2 kg   SpO2 97%   BMI 24.69 kg/m  Physical Exam Vitals and nursing note reviewed.  Cardiovascular:     Rate and Rhythm: Regular rhythm.  Chest:     Chest wall: No tenderness.  Abdominal:     General: There is no distension.  Genitourinary:    Comments: Some gross blood on rectal exam. Skin:    Capillary Refill: Capillary refill takes less than 2 seconds.  Neurological:     Mental Status: She is alert.     ED Results / Procedures / Treatments   Labs (  all labs ordered are listed, but only abnormal results are displayed) Labs Reviewed  URINALYSIS, ROUTINE W REFLEX MICROSCOPIC - Abnormal; Notable for the following components:      Result Value   Ketones, ur 5 (*)    All other components within normal limits  BASIC METABOLIC PANEL - Abnormal; Notable for the following components:   Potassium 3.4 (*)    Glucose, Bld 126 (*)    Creatinine, Ser 1.30 (*)    GFR, Estimated 46 (*)    All other components within normal limits  CBC  POC OCCULT BLOOD, ED    EKG None  Radiology DG Abdomen 1 View  Result Date: 05/21/2023 CLINICAL DATA:  Homero Fellers hematuria. EXAM: ABDOMEN - 1 VIEW COMPARISON:  December 21, 2021 FINDINGS: The bowel gas pattern is normal. Radiopaque  surgical clips are seen overlying the right upper quadrant and left hemipelvis. No radio-opaque calculi or other significant radiographic abnormality are seen. IMPRESSION: 1. No radiopaque calculi. 2. No evidence of bowel obstruction. Electronically Signed   By: Aram Candela M.D.   On: 05/21/2023 20:14    Procedures Procedures    Medications Ordered in ED Medications - No data to display  ED Course/ Medical Decision Making/ A&P                             Medical Decision Making Amount and/or Complexity of Data Reviewed Labs: ordered. Radiology: ordered.   Patient presented with complaint of hematuria.  However later stated it was not urine blood it was rectal blood.  Hemoglobin reassuring.  Urine did not show infection.  X-ray done to look for radiopaque calculi and did not show any but would not have it done if patient had said it was rectal bleeding from the beginning.  With reassuring vitals and normal hemoglobin I think can be followed as an outpatient.  Seen Dr. Marletta Lor in the past.  Will return for worsening symptoms.  Discharge home.        Final Clinical Impression(s) / ED Diagnoses Final diagnoses:  Lower GI bleed    Rx / DC Orders ED Discharge Orders     None         Benjiman Core, MD 05/22/23 0003

## 2023-06-12 DIAGNOSIS — I10 Essential (primary) hypertension: Secondary | ICD-10-CM | POA: Diagnosis not present

## 2023-06-12 DIAGNOSIS — J440 Chronic obstructive pulmonary disease with acute lower respiratory infection: Secondary | ICD-10-CM | POA: Diagnosis not present

## 2023-07-03 DIAGNOSIS — F17210 Nicotine dependence, cigarettes, uncomplicated: Secondary | ICD-10-CM | POA: Diagnosis not present

## 2023-07-03 DIAGNOSIS — K219 Gastro-esophageal reflux disease without esophagitis: Secondary | ICD-10-CM | POA: Diagnosis not present

## 2023-07-03 DIAGNOSIS — J449 Chronic obstructive pulmonary disease, unspecified: Secondary | ICD-10-CM | POA: Diagnosis not present

## 2023-07-03 DIAGNOSIS — R3981 Functional urinary incontinence: Secondary | ICD-10-CM | POA: Diagnosis not present

## 2023-07-03 DIAGNOSIS — I10 Essential (primary) hypertension: Secondary | ICD-10-CM | POA: Diagnosis not present

## 2023-07-03 DIAGNOSIS — F17219 Nicotine dependence, cigarettes, with unspecified nicotine-induced disorders: Secondary | ICD-10-CM | POA: Diagnosis not present

## 2023-07-17 NOTE — Progress Notes (Unsigned)
Referring Provider: Benetta Spar* Primary Care Physician:  Benetta Spar, MD Primary GI Physician: Dr. Marletta Lor  No chief complaint on file.   HPI:   Virginia Washington is a 67 y.o. female with GI history of partial SBO in 2020 managed conservatively, constipation, GERD with esophagitis, dysphagia with esophageal stenosis and Schatzki ring, chronic abdominal pain, adenomatous colon polyps with surveillance due between March 2024-2026, presenting today with chief complaint of foods getting stuck in the lower chest/upper abdomen.  Last seen in our office 06/22/2022 for constipation, epigastric abdominal pain, GERD, dysphagia.  Epigastric pain started in April, worsened by meals, associated bloating, distention, nausea without vomiting.  Reported reflux was well controlled on pantoprazole daily.  She was taking meloxicam twice daily.  No other NSAIDs.  Noted prior ER evaluation in April for the same symptoms with CT and labs without acute findings.  Bowels are moving 2-3 times a week taking Linzess 290 mcg a couple times a week when her stomach got bloated.  Stated Linzess was too strong to take daily.  Recommended increasing PPI to twice daily, update labs, EGD, and try decreasing Linzess to 145 mcg daily.  Samples of Linzess were provided.   EGD 07/17/2022: Grade B reflux esophagitis biopsied, small hiatal hernia, mild Schatzki's ring s/p dilation, normal stomach and examined duodenum.  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 Recommended PPI twice daily and repeating EGD with rebiopsy in 3 months.  Notably, patient was seen in the emergency room 05/21/2023 with reports of rectal bleeding.  Hemoglobin was 14.3.  Fecal occult blood positive.  Recommended outpatient follow-up with GI.  Today: For the last several months, feels like food is just sitting in the low substernal area/epigastric area. Occurs with  everything other than liquids. Will last several hours, then eventually move on down. Associated discomfort and early satiety.  No nausea or vomiting. Taking pantoprazole 40 mg once daily. Takes Maalox at times and this helps some. No heartburn. No weight loss.   Taking Linzess every morning. Having Bms daily. In June had some rectal bleeding when she was constipated and couldn't go to the bathroom. Strained and had some blood in toilet water and on stool. Has hemorrhoids.   No NSAIDs.    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  Normal right aortoiliac vessels.  Three-vessel runoff bilaterally.  (Sluggish flow of the left side due to occlusion.   ABDOMINAL HYSTERECTOMY     BALLOON DILATION N/A 10/05/2020   Procedure: BALLOON DILATION;  Surgeon: Lanelle Bal, DO;  Location: [facility];  Service: Endoscopy;  Laterality: N/A;   BALLOON DILATION N/A 07/17/2022   Procedure: BALLOON DILATION;  Surgeon: Lanelle Bal, DO;  Location: [facility];  Service: Endoscopy;  Laterality: N/A;   BIOPSY  10/05/2020  Procedure: BIOPSY;  Surgeon: Lanelle Bal, DO;  Location: [facility];  Service: Endoscopy;;   BIOPSY  07/17/2022   Procedure: BIOPSY;  Surgeon: Lanelle Bal, DO;  Location: [facility];  Service: Endoscopy;;   CHOLECYSTECTOMY     COLONOSCOPY  2007   Dr.  Karilyn Cota: small internal hemorrhoids, otherwise normal. exam could be compromised due to quality of prep   COLONOSCOPY N/A 02/15/2018   Dr. Darrick Penna: 2 simple adenomas, external and internal hemorrhoids. Next colonoscopy 2024-2026   ESOPHAGOGASTRODUODENOSCOPY N/A 05/09/2018   Procedure: ESOPHAGOGASTRODUODENOSCOPY (EGD);  Surgeon: West Bali, MD; web in the distal esophagus s/p dilated, mild gastritis biopsied, normal examined duodenum. Pathology with gastritis, negative for H. Pylori.     ESOPHAGOGASTRODUODENOSCOPY (EGD) WITH PROPOFOL N/A 10/05/2020   Surgeon: Lanelle Bal, DO; small hiatal hernia, grade B reflux esophagitis, benign-appearing esophageal stenosis dilated, gastritis biopsied- mild nonspecific reactive gastropathy, negative for H. pylori.   ESOPHAGOGASTRODUODENOSCOPY (EGD) WITH PROPOFOL N/A 07/17/2022   Procedure: ESOPHAGOGASTRODUODENOSCOPY (EGD) WITH PROPOFOL;  Surgeon: Lanelle Bal, DO;  Location: [facility];  Service: Endoscopy;  Laterality: N/A;  9:45am   FEMORAL-FEMORAL BYPASS GRAFT Bilateral 10/21/2018   Procedure: RIGHT TO LEFT FEMORAL ARTERY BYPASS GRAFT;  Surgeon: Sherren Kerns, MD;  Location[facility] OR;  Service: Vascular;  Laterality: Bilateral;   LOWER EXTREMITY ANGIOGRAPHY N/A 09/27/2018   Procedure: LOWER EXTREMITY ANGIOGRAPHY;  Surgeon: Sherren Kerns, MD;  Location: [facility];  Service: Cardiovascular;  Laterality: N/A;   NM MYOVIEW LTD  12/2014   [facility]: Normal Myoview.  Nonischemic.   TRANSTHORACIC ECHOCARDIOGRAM  12/2011   EF 55 to 60%.  No regional wall motion normality.  GRII DD.  No significant valve disease.   VASCULAR SURGERY      Current Outpatient Medications  Medication Sig Dispense Refill   albuterol (VENTOLIN HFA) 108 (90 Base) MCG/ACT inhaler Inhale 2 puffs into the lungs every 6 (six) hours as needed for wheezing or shortness of breath. 1 each 0   alum & mag hydroxide-simeth (MAALOX MAX) 400-400-40 MG/5ML  suspension Take 15 mLs by mouth every 6 (six) hours as needed for indigestion. 355 mL 0   amLODipine (NORVASC) 5 MG tablet Take 5 mg by mouth daily.     gabapentin (NEURONTIN) 100 MG capsule      hydrochlorothiazide (HYDRODIURIL) 12.5 MG tablet Take 12.5 mg by mouth daily.     ipratropium-albuterol (DUONEB) 0.5-2.5 (3) MG/3ML SOLN SMARTSIG:1 Vial(s) Via Nebulizer Every 4-6 Hours PRN     pantoprazole (PROTONIX) 40 MG tablet Take 1 tablet (40 mg total) by mouth 2 (two) times daily before a meal. 60 tablet 3   TRELEGY ELLIPTA 100-62.5-25 MCG/ACT AEPB      atorvastatin (LIPITOR) 20 MG tablet Take 20 mg by mouth daily. (Patient not taking: Reported on 07/19/2023)     linaclotide (LINZESS) 290 MCG CAPS capsule Take 1 capsule (290 mcg total) by mouth daily before breakfast. (Patient not taking: Reported on 07/19/2023) 90 capsule 3   simethicone (MYLICON) 125 MG chewable tablet Chew 1 tablet after each meal and at bedtime if needed (Patient not taking: Reported on 07/19/2023) 120 tablet 5   No current facility-administered medications for this visit.    Allergies as of 07/19/2023   (No Known Allergies)    Family History  Problem Relation Age of Onset   Hypertension Mother    Peripheral Artery Disease Brother    Colon cancer Neg Hx  Colon polyps Neg Hx     Social History   Socioeconomic History   Marital status: Widowed    Spouse name: Not on file   Number of children: Not on file   Years of education: Not on file   Highest education level: Not on file  Occupational History   Occupation: unemployed    Comment: Draws pension from her husband's life insurance policy  Tobacco Use   Smoking status: Every Day    Current packs/day: 0.50    Average packs/day: 0.5 packs/day for 40.0 years (20.0 ttl pk-yrs)    Types: Cigarettes   Smokeless tobacco: Never  Vaping Use   Vaping status: Never Used  Substance and Sexual Activity   Alcohol use: No    Alcohol/week: 0.0 standard drinks of alcohol    Drug use: No   Sexual activity: Not Currently  Other Topics Concern   Not on file  Social History Narrative   Widowed: GETS HUSBAND'S PENSION.  mother 3, grandmother of 54 with one great grandchild.  She currently lives alone.  She completed ninth grade.  Smokes a pack a day for 42 years.  Tries to walks for about 5 minutes at x7 days a week with plans to help revascularization.       HOBBIES: PUZZLE BOOKS.   Social Determinants of Health   Financial Resource Strain: Not on file  Food Insecurity: Not on file  Transportation Needs: Not on file  Physical Activity: Not on file  Stress: Not on file  Social Connections: Not on file    Review of Systems: Gen: Denies fever, chills, cold or flulike symptoms, presyncope, syncope. CV: Denies chest pain, palpitations. Resp: Denies dyspnea, cough. GI: See HPI Heme: See HPI  Physical Exam: BP 112/72 (BP Location: Right Arm, Patient Position: Sitting, Cuff Size: Normal)   Pulse 65   Temp 97.6 F (36.4 C) (Temporal)   Ht 5\' 2"  (1.575 m)   Wt 138 lb 9.6 oz (62.9 kg)   SpO2 (!) 75%   BMI 25.35 kg/m  General:   Alert and oriented. No distress noted. Pleasant and cooperative.  Head:  Normocephalic and atraumatic. Eyes:  Conjuctiva clear without scleral icterus. Heart:  S1, S2 present without murmurs appreciated. Lungs:  Clear to auscultation bilaterally. No wheezes, rales, or rhonchi. No distress.  Abdomen:  +BS, soft, non-tender and non-distended. No rebound or guarding. No HSM or masses noted. Msk:  Symmetrical without gross deformities. Normal posture. Extremities:  Without edema. Neurologic:  Alert and  oriented x4 Psych:  Normal mood and affect.    Assessment:  66 y.o. female with GI history of partial SBO in 2020 managed conservatively, constipation, GERD with esophagitis, dysphagia with esophageal stenosis and Schatzki ring, chronic abdominal pain, adenomatous colon polyps due for surveillance, presenting today with chief  complaint of foods getting stuck in the low substernal chest/epigastric area for the last several months with associated epigastric discomfort and early satiety.  Denies weight loss, nausea, vomiting, heartburn symptoms on pantoprazole daily.  Notably, EGD last year in July with grade B reflux esophagitis biopsied, small hiatal hernia, mild Schatzki's ring s/p dilation. 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 Recommended PPI twice daily and repeating EGD with rebiopsy in 3 months, but patient was lost to follow-up.   Query whether her symptoms are related to recurrent Schatzki's ring.  I recommended EGD for further evaluation.  Also recommended increasing PPI to twice daily for now.  History  of colon polyps: Currently due for surveillance colonoscopy.  Last colonoscopy in 2019 with 2 simple adenomas, external/internal hemorrhoids.  Recommended repeat colonoscopy 2024-2026.  She did have an episode of rectal bleeding in June in the setting of constipation and straining, but no recurrent symptoms.  This was likely secondary to hemorrhoids. Constipation is currently well controlled with Linzess.   Plan:  Proceed with upper endoscopy +/- dilation + colonoscopy with propofol by Dr. Marletta Lor in near future. The risks, benefits, and alternatives have been discussed with the patient in detail. The patient states understanding and desires to proceed.  ASA 3 Increase pantoprazole to 40 mg twice daily. Dysphagia precautions:  Eat slowly, take small bites, chew thoroughly, drink plenty of liquids throughout meals.  Avoid trough textures All meats should be chopped finely.  Continue Linzess daily.  Patient is to call to let us know what dose she is currently taking. Follow-up after procedures.   [place]ia Memos, PA-C [facility] 07/19/2023

## 2023-07-19 ENCOUNTER — Ambulatory Visit (INDEPENDENT_AMBULATORY_CARE_PROVIDER_SITE_OTHER): Payer: Medicare HMO | Admitting: Gastroenterology

## 2023-07-19 ENCOUNTER — Encounter: Payer: Self-pay | Admitting: Gastroenterology

## 2023-07-19 VITALS — BP 112/72 | HR 65 | Temp 97.6°F | Ht 62.0 in | Wt 138.6 lb

## 2023-07-19 DIAGNOSIS — R1310 Dysphagia, unspecified: Secondary | ICD-10-CM | POA: Diagnosis not present

## 2023-07-19 DIAGNOSIS — Z860100 Personal history of colon polyps, unspecified: Secondary | ICD-10-CM

## 2023-07-19 DIAGNOSIS — R1013 Epigastric pain: Secondary | ICD-10-CM | POA: Diagnosis not present

## 2023-07-19 MED ORDER — PANTOPRAZOLE SODIUM 40 MG PO TBEC
40.0000 mg | DELAYED_RELEASE_TABLET | Freq: Two times a day (BID) | ORAL | 3 refills | Status: DC
Start: 2023-07-19 — End: 2024-01-01

## 2023-07-19 NOTE — Patient Instructions (Addendum)
We will arrange to have an upper endoscopy with possible stretching of your esophagus and colonoscopy in the near future with Dr. Marletta Lor.  For now, I recommend that you increase your pantoprazole to 40 mg twice daily 30 minutes before breakfast and dinner.  Eat slowly, take small bites, chew thoroughly, drink plenty of liquids throughout meals.  Avoid trough textures All meats should be chopped finely.   Try eating 4-6 small meals a day as you feel that you get full quickly.  Continue to take Linzess.  Please call and let us know what dose of Linzess you are taking.  We will see back in the office after your procedures.  Do not hesitate to call sooner if you have questions or concerns.  It was nice to see you again today!  Ermalinda Memos, PA-C [facility]

## 2023-07-27 ENCOUNTER — Encounter: Payer: Self-pay | Admitting: *Deleted

## 2023-07-27 ENCOUNTER — Other Ambulatory Visit: Payer: Self-pay | Admitting: *Deleted

## 2023-07-27 ENCOUNTER — Telehealth: Payer: Self-pay | Admitting: *Deleted

## 2023-07-27 MED ORDER — PEG 3350-KCL-NA BICARB-NACL 420 G PO SOLR: 4000.0000 mL | Freq: Once | ORAL | 0 refills | Status: AC

## 2023-07-27 NOTE — Telephone Encounter (Signed)
[employer] PA: Approved Authorization #[id number]  Tracking #[id number] Dates of service 09/03/2023 - 12/03/2023

## 2023-07-27 NOTE — Telephone Encounter (Signed)
Pt says she was supposed to have a stool softener sent in to the pharmacy. She says pharmacy has not received anything at this time. She says she takes Linzess in the morning but would rather have something for the night. Please advise. Thank you

## 2023-07-29 NOTE — Telephone Encounter (Signed)
When I saw her in the office, she reported she was having Bms daily with Linzess, but wasn't sure the dose she was taking. I had asked that she call our office when she got home to let us know the dose she is taking.   Toni Amend, can you all the patient and find out what dose of Linzess she is taking? Please ask if she is having productive Bms daily or if she feeling somewhat constipated. If she isn't already on max Linzess dose, we can increase her dose if needed.

## 2023-07-30 ENCOUNTER — Encounter: Payer: Self-pay | Admitting: *Deleted

## 2023-07-30 NOTE — Telephone Encounter (Signed)
Noted, informed to drink plenty of fluid and call in a couple of weeks with an update.

## 2023-07-30 NOTE — Telephone Encounter (Signed)
Agree with increasing MiraLAX to twice daily in addition to Linzess 290 mcg every day. It is also important that she drink at least 64 oz of water a day to help with her constipation. If she continues to struggle with constipation, please let me know and we can try something different.

## 2023-07-30 NOTE — Telephone Encounter (Signed)
Spoke to pt, she informed me that she is taking Linzess . She states Linzess is not working for her. She had a hard time moving her bowels yesterday. She states she is only taking MiraLax once a day. Informed to increase to twice a day.

## 2023-08-03 DIAGNOSIS — I10 Essential (primary) hypertension: Secondary | ICD-10-CM | POA: Diagnosis not present

## 2023-08-03 DIAGNOSIS — J440 Chronic obstructive pulmonary disease with acute lower respiratory infection: Secondary | ICD-10-CM | POA: Diagnosis not present

## 2023-08-13 NOTE — Progress Notes (Deleted)
Name: Virginia Washington DOB: 13-Mar-1956 MRN: [id number]  History of Present Illness: Virginia Washington is a 67 y.o. female who presents today as a new patient at [facility]. All available relevant medical records have been reviewed.  - GU History: 1. ***.  Today: She reports chief complaint of ***  She {Actions; denies-reports:120008} urge incontinence. She {Actions; denies-reports:120008} stress incontinence with ***cough/***laugh/***sneeze/***heavy lifting /***exercise. She reports the ***SUI / ***UUI is predominant.  She leaks *** times per ***. Wears *** ***pads/ ***diapers per day. She reports urinary incontinence {ACTION; IS/IS [id number]} significantly bothersome.   She reports urinary ***frequency, ***nocturia, ***urgency, and ***urge incontinence. Voiding ***x/day and ***x/night on average. Leaking ***x/day on average; using *** ***pads / ***diapers per day on average.  She {Actions; denies-reports:120008} prior attempted treatment for these symptoms ***including ***  She {Actions; denies-reports:120008} routine caffeine intake (*** cups of coffee / tea / soda per day).  She {Actions; denies-reports:120008} history of obstructive sleep apnea and {Actions; denies-reports:120008} wearing CPAP routinely. ***She denies ever having a sleep study before. She {Actions; denies-reports:120008} fluid intake within 3 hours prior to bedtime. She {Actions; denies-reports:120008} fluid intake during the night.  She {Actions; denies-reports:120008} caffeine intake within 8 hours prior to bedtime. She {Actions; denies-reports:120008} routinely experiencing lower extremity edema during the day. ***She {Actions; denies-reports:120008} elevating feet during the day and/or wearing compression socks when lower extremity edema is present during the day. ***She {Actions; denies-reports:120008} monitoring dietary salt and sodium intake.  She {Actions; denies-reports:120008} dysuria,  gross hematuria, straining to void, or sensations of incomplete emptying.   Fall Screening: Do you usually have a device to assist in your mobility? {yes/no:20286} ***cane / ***walker / ***wheelchair  Medications: Current Outpatient Medications  Medication Sig Dispense Refill   albuterol (VENTOLIN HFA) 108 (90 Base) MCG/ACT inhaler Inhale 2 puffs into the lungs every 6 (six) hours as needed for wheezing or shortness of breath. 1 each 0   alum & mag hydroxide-simeth (MAALOX MAX) 400-400-40 MG/5ML suspension Take 15 mLs by mouth every 6 (six) hours as needed for indigestion. 355 mL 0   amLODipine (NORVASC) 5 MG tablet Take 5 mg by mouth daily.     atorvastatin (LIPITOR) 20 MG tablet Take 20 mg by mouth daily. (Patient not taking: Reported on 07/19/2023)     gabapentin (NEURONTIN) 100 MG capsule      hydrochlorothiazide (HYDRODIURIL) 12.5 MG tablet Take 12.5 mg by mouth daily.     ipratropium-albuterol (DUONEB) 0.5-2.5 (3) MG/3ML SOLN SMARTSIG:1 Vial(s) Via Nebulizer Every 4-6 Hours PRN     linaclotide (LINZESS) 290 MCG CAPS capsule Take 1 capsule (290 mcg total) by mouth daily before breakfast. (Patient not taking: Reported on 07/19/2023) 90 capsule 3   pantoprazole (PROTONIX) 40 MG tablet Take 1 tablet (40 mg total) by mouth 2 (two) times daily before a meal. 60 tablet 3   simethicone (MYLICON) 125 MG chewable tablet [place] 1 tablet after each meal and at bedtime if needed (Patient not taking: Reported on 07/19/2023) 120 tablet 5   TRELEGY ELLIPTA 100-62.5-25 MCG/ACT AEPB      No current facility-administered medications for this visit.    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  Normal right aortoiliac vessels.  Three-vessel runoff bilaterally.  (Sluggish flow of the left side due to occlusion.   ABDOMINAL HYSTERECTOMY     BALLOON DILATION N/A 10/05/2020   Procedure: BALLOON DILATION;  Surgeon: Lanelle Bal, DO;  Location: [facility];  Service: Endoscopy;  Laterality: N/A;   BALLOON DILATION N/A 07/17/2022   Procedure: BALLOON DILATION;  Surgeon: Lanelle Bal, DO;  Location: [facility];  Service: Endoscopy;  Laterality: N/A;   BIOPSY  10/05/2020   Procedure: BIOPSY;  Surgeon: Lanelle Bal, DO;  Location: [facility];  Service: Endoscopy;;   BIOPSY  07/17/2022   Procedure: BIOPSY;  Surgeon: Lanelle Bal, DO;  Location: [facility];  Service: Endoscopy;;   CHOLECYSTECTOMY     COLONOSCOPY  2007   Dr. Karilyn Cota: small internal hemorrhoids, otherwise normal. exam could be compromised due to quality of prep   COLONOSCOPY N/A 02/15/2018   Dr. Darrick Penna: 2 simple adenomas, external and internal hemorrhoids. Next colonoscopy 2024-2026   ESOPHAGOGASTRODUODENOSCOPY N/A 05/09/2018   Procedure: ESOPHAGOGASTRODUODENOSCOPY (EGD);  Surgeon: West Bali, MD; web in the distal esophagus s/p dilated, mild gastritis biopsied, normal examined duodenum. Pathology with gastritis, negative for H. Pylori.     ESOPHAGOGASTRODUODENOSCOPY (EGD) WITH PROPOFOL N/A 10/05/2020   Surgeon: Lanelle Bal, DO; small hiatal hernia, grade B reflux esophagitis, benign-appearing esophageal stenosis dilated, gastritis biopsied- mild nonspecific reactive gastropathy, negative for H. pylori.   ESOPHAGOGASTRODUODENOSCOPY (EGD) WITH PROPOFOL N/A  07/17/2022   Procedure: ESOPHAGOGASTRODUODENOSCOPY (EGD) WITH PROPOFOL;  Surgeon: Lanelle Bal, DO;  Location: [facility];  Service: Endoscopy;  Laterality: N/A;  9:45am   FEMORAL-FEMORAL BYPASS GRAFT Bilateral 10/21/2018   Procedure: RIGHT TO LEFT FEMORAL ARTERY BYPASS GRAFT;  Surgeon: Sherren Kerns, MD;  Location: [facility] OR;  Service: Vascular;  Laterality: Bilateral;   LOWER EXTREMITY ANGIOGRAPHY N/A 09/27/2018   Procedure: LOWER EXTREMITY ANGIOGRAPHY;  Surgeon: Sherren Kerns, MD;  Location: [facility];  Service: Cardiovascular;  Laterality: N/A;   NM MYOVIEW LTD  12/2014   [facility]: Normal Myoview.  Nonischemic.   TRANSTHORACIC ECHOCARDIOGRAM  12/2011   EF 55 to 60%.  No regional wall motion normality.  GRII DD.  No significant valve disease.   VASCULAR SURGERY     Family History  Problem Relation Age of Onset   Hypertension Mother    Peripheral Artery Disease Brother    Colon cancer Neg Hx    Colon polyps Neg Hx    Social History   Socioeconomic History   Marital status: Widowed    Spouse name: Not on file   Number of children: Not on file   Years of education: Not on file   Highest education level: Not on file  Occupational History   Occupation: unemployed    Comment: Draws pension from her husband's life insurance policy  Tobacco Use   Smoking status: Every Day    Current packs/day: 0.50    Average packs/day: 0.5 packs/day for 40.0 years (20.0 ttl pk-yrs)    Types: Cigarettes   Smokeless tobacco: Never  Vaping Use   Vaping status:  Never Used  Substance and Sexual Activity   Alcohol use: No    Alcohol/week: 0.0 standard drinks of alcohol   Drug use: No   Sexual activity: Not Currently  Other Topics Concern   Not on file  Social History Narrative   Widowed: GETS HUSBAND'S PENSION.  mother 3, grandmother of 48 with one great grandchild.  She currently lives alone.  She completed ninth grade.  Smokes a pack a day for 42 years.  Tries to  walks for about 5 minutes at x7 days a week with plans to help revascularization.       HOBBIES: PUZZLE BOOKS.   Social Determinants of Health   Financial Resource Strain: Not on file  Food Insecurity: Not on file  Transportation Needs: Not on file  Physical Activity: Not on file  Stress: Not on file  Social Connections: Not on file  Intimate Partner Violence: Not on file    SUBJECTIVE  Review of Systems Constitutional: Patient ***denies any unintentional weight loss or change in strength lntegumentary: Patient ***denies any rashes or pruritus Eyes: Patient denies ***dry eyes ENT: Patient ***denies dry mouth Cardiovascular: Patient ***denies chest pain or syncope Respiratory: Patient ***denies shortness of breath Gastrointestinal: Patient ***denies nausea, vomiting, constipation, or diarrhea Musculoskeletal: Patient ***denies muscle cramps or weakness Neurologic: Patient ***denies convulsions or seizures Psychiatric: Patient ***denies memory problems Allergic/Immunologic: Patient ***denies recent allergic reaction(s) Hematologic/Lymphatic: Patient denies bleeding tendencies Endocrine: Patient ***denies heat/cold intolerance  GU: As per HPI.  OBJECTIVE There were no vitals filed for this visit. There is no height or weight on file to calculate BMI.  Physical Examination  Constitutional: ***No obvious distress; patient is ***non-toxic appearing  Cardiovascular: ***No visible lower extremity edema.  Respiratory: The patient does ***not have audible wheezing/stridor; respirations do ***not appear labored  Gastrointestinal: Abdomen ***non-distended Musculoskeletal: ***Normal ROM of UEs  Skin: ***No obvious rashes/open sores  Neurologic: CN 2-12 grossly ***intact Psychiatric: Answered questions ***appropriately with ***normal affect  Hematologic/Lymphatic/Immunologic: ***No obvious bruises or sites of spontaneous bleeding  UA: ***negative / *** WBC/hpf, *** RBC/hpf,  bacteria (***) PVR: *** ml  ASSESSMENT No diagnosis found.  We discussed the symptoms of overactive bladder (OAB), which include urinary urgency, frequency, nocturia, with or without urge incontinence.   While we may not know the exact etiology of OAB, several risk factors can be identified.  - We discussed this patient's neurogenic risk factors for OAB-type symptoms including ***T2DM, ***nicotine use, ***.  - Likely exacerbated by ***diuretic use, ***caffeine intake, ***consumption of bladder irritants such as (acidic foods, spicy foods, alcohol).   We discussed the following management options in detail including potential benefits, risks, and side effects: Behavioral therapy: Modify fluid intake Decreasing bladder irritants (such as caffeine, acidic foods, spicy foods, alcohol) Urge suppression strategies Bladder retraining / timed voiding Double voiding Medication(s): - For anticholinergic medications, we discussed the potential side effects of anticholinergics including dry eyes, dry mouth, constipation, cognitive impairment and urinary retention.  - For beta-3 agonist medication, we discussed the risk for urinary retention and the potential side effect of elevated blood pressure specific to Myrbetriq (which is more likely to occur in individuals with uncontrolled hypertension).  For refractory cases: PTNS (posterior tibial nerve stimulation) Sacral neuromodulation trial (Medtronic lnterStim or Axonics implant) Bladder Botox injections In extreme cases, SP tube placement  ***In order to further evaluate urinary incontinence, She was instructed to complete a 3-day bladder diary.  ***Discussed recommendation for urodynamic testing, which was described in detail including risks such  as bleeding, infection, organ/tissue/nerve damage.  She decided to *** ***work on behavioral modifications including ***minimizing caffeine intake and working on ***timed voiding.  Will plan for follow  up in ***8 weeks / *** months or sooner if needed. Pt verbalized understanding and agreement. All questions were answered.  PLAN Advised the following: *** ***. Minimize caffeine intake. ***. Work on timed voiding. ***. Urge suppression strategies. ***. Double/ triple voiding. ***. No follow-ups on file.  No orders of the defined types were placed in this encounter.   It has been explained that the patient is to follow regularly with their PCP in addition to all other providers involved in their care and to follow instructions provided by these respective offices. Patient advised to contact urology clinic if any urologic-pertaining questions, concerns, new symptoms or problems arise in the interim period.  There are no Patient Instructions on file for this visit.  Electronically signed by:  Donnita Falls, MSN, FNP-C, CUNP 08/13/2023 8:46 AM

## 2023-08-15 ENCOUNTER — Ambulatory Visit: Payer: Medicare HMO | Admitting: Urology

## 2023-08-15 DIAGNOSIS — R32 Unspecified urinary incontinence: Secondary | ICD-10-CM

## 2023-08-28 ENCOUNTER — Other Ambulatory Visit: Payer: Self-pay

## 2023-08-28 ENCOUNTER — Encounter (HOSPITAL_COMMUNITY): Payer: Self-pay | Admitting: *Deleted

## 2023-08-28 ENCOUNTER — Emergency Department (HOSPITAL_COMMUNITY)
Admission: EM | Admit: 2023-08-28 | Discharge: 2023-08-28 | Disposition: A | Discharge status: Home / Self Care | Payer: Medicare HMO | Attending: Emergency Medicine | Admitting: Emergency Medicine

## 2023-08-28 DIAGNOSIS — I10 Essential (primary) hypertension: Secondary | ICD-10-CM | POA: Diagnosis not present

## 2023-08-28 DIAGNOSIS — J449 Chronic obstructive pulmonary disease, unspecified: Secondary | ICD-10-CM | POA: Diagnosis not present

## 2023-08-28 DIAGNOSIS — R5383 Other fatigue: Secondary | ICD-10-CM | POA: Diagnosis present

## 2023-08-28 DIAGNOSIS — U071 COVID-19: Secondary | ICD-10-CM | POA: Insufficient documentation

## 2023-08-28 DIAGNOSIS — Z79899 Other long term (current) drug therapy: Secondary | ICD-10-CM | POA: Insufficient documentation

## 2023-08-28 LAB — SARS CORONAVIRUS 2 BY RT PCR: SARS Coronavirus 2 by RT PCR: POSITIVE — AB

## 2023-08-28 NOTE — ED Provider Notes (Signed)
[place] EMERGENCY DEPARTMENT AT [facility] Provider Note   CSN: [id number] Arrival date & time: 08/28/23  0935     History  Chief Complaint  Patient presents with   Generalized Body Aches    Virginia Washington is a 67 y.o. female, history of hypertension, COPD, who presents to the ED secondary to runny nose, sore throat, and fatigue for the last 4 days.  She states that she is just felt unwell, denies any nausea, vomiting, diarrhea, shortness of breath or chest pain.  Has been eating and drinking well.   Home Medications Prior to Admission medications   Medication Sig Start Date End Date Taking? Authorizing Provider  albuterol (VENTOLIN HFA) 108 (90 Base) MCG/ACT inhaler Inhale 2 puffs into the lungs every 6 (six) hours as needed for wheezing or shortness of breath. 05/21/22   Horton, Mayer Masker, MD  alum & mag hydroxide-simeth (MAALOX MAX) 400-400-40 MG/5ML suspension Take 15 mLs by mouth every 6 (six) hours as needed for indigestion. 06/23/22   Kommor, [place], MD  amLODipine (NORVASC) 5 MG tablet Take 5 mg by mouth daily.    [provider]  atorvastatin (LIPITOR) 20 MG tablet Take 20 mg by mouth daily. Patient not taking: Reported on 07/19/2023 10/03/22   [provider]  gabapentin (NEURONTIN) 100 MG capsule  12/13/22   [provider]  hydrochlorothiazide (HYDRODIURIL) 12.5 MG tablet Take 12.5 mg by mouth daily. 03/02/22   [provider]  ipratropium-albuterol (DUONEB) 0.5-2.5 (3) MG/3ML SOLN SMARTSIG:1 Vial(s) Via Nebulizer Every 4-6 Hours PRN 09/08/22   [provider]  linaclotide (LINZESS) 290 MCG CAPS capsule Take 1 capsule (290 mcg total) by mouth daily before breakfast. Patient not taking: Reported on 07/19/2023 04/20/22 04/20/23  Lanelle Bal, DO  pantoprazole (PROTONIX) 40 MG tablet Take 1 tablet (40 mg total) by mouth 2 (two) times daily before a meal. 07/19/23   Letta Median, PA-C  simethicone (MYLICON) 125 MG  chewable tablet Chew 1 tablet after each meal and at bedtime if needed Patient not taking: Reported on 07/19/2023 01/12/22   Lanelle Bal, DO  TRELEGY ELLIPTA 100-62.5-25 MCG/ACT AEPB  12/13/22   [provider]      Allergies    Patient has no known allergies.    Review of Systems   Review of Systems  Constitutional:  Positive for fatigue.  Respiratory:  Negative for cough and shortness of breath.     Physical Exam Updated Vital Signs BP 115/76   Pulse 62   Temp 98.4 F (36.9 C) (Oral)   Resp 16   Ht 5\' 2"  (1.575 m)   Wt 61.2 kg   SpO2 100%   BMI 24.69 kg/m  Physical Exam Vitals and nursing note reviewed.  Constitutional:      General: She is not in acute distress.    Appearance: She is well-developed.  HENT:     Head: Normocephalic and atraumatic.  Eyes:     Conjunctiva/sclera: Conjunctivae normal.  Cardiovascular:     Rate and Rhythm: Normal rate and regular rhythm.     Heart sounds: No murmur heard. Pulmonary:     Effort: Pulmonary effort is normal. No respiratory distress.     Breath sounds: Normal breath sounds.  Abdominal:     Palpations: Abdomen is soft.     Tenderness: There is no abdominal tenderness.  Musculoskeletal:        General: No swelling.     Cervical back: Neck supple.  Skin:    General: Skin is warm and dry.     Capillary Refill: Capillary refill takes less than 2 seconds.  Neurological:     Mental Status: She is alert.  Psychiatric:        Mood and Affect: Mood normal.     ED Results / Procedures / Treatments   Labs (all labs ordered are listed, but only abnormal results are displayed) Labs Reviewed  SARS CORONAVIRUS 2 BY RT PCR - Abnormal; Notable for the following components:      Result Value   SARS Coronavirus 2 by RT PCR POSITIVE (*)    All other components within normal limits    EKG None  Radiology No results found.  Procedures Procedures    Medications Ordered in ED Medications - No data to  display  ED Course/ Medical Decision Making/ A&P                                 Medical Decision Making Patient is a 67 year old female, here for runny nose, fatigue, sore throat this been going on for the last 4 days.  Just feels unwell.  Has been eating and drinking well, denies any chest pain, shortness of breath.  Lung sounds are clear, we will obtain a COVID test for further evaluation.  Amount and/or Complexity of Data Reviewed Labs:     Details: COVID-positive Discussion of management or test interpretation with external provider(s): Discussed with patient, symptomatic treatment with COVID, she voiced understanding.  She is in no acute distress, is overall well-appearing, and symptoms are mild.  We discussed conservative treatment, and she is in agreement with plan.  Discussed follow-up with primary care doctor as needed, and emergent return precautions    Final Clinical Impression(s) / ED Diagnoses Final diagnoses:  COVID-19    Rx / DC Orders ED Discharge Orders     None         Wynn Alldredge, Harley [place], PA 08/28/23 1223    Gloris Manchester, MD 08/28/23 [phone]

## 2023-08-28 NOTE — Discharge Instructions (Addendum)
Today we found out that you have COVID-19.  This is why you are so tired.  Return to the ER if you have shortness of breath, chest pain, oxygen saturations less than 90%, or stopped eating and drinking.  Make sure you take over-the-counter flu medicine, that help with your symptoms and stay away from others when you feel sick

## 2023-08-28 NOTE — ED Triage Notes (Signed)
Pt c/o generalized body aches x 4 days

## 2023-08-29 ENCOUNTER — Telehealth: Payer: Self-pay | Admitting: *Deleted

## 2023-08-29 NOTE — Telephone Encounter (Signed)
Pt called in. She was diagnosed with covid yesterday and needs to cancel upcoming procedure with Dr. Marletta Lor. Aware will call once we receive Oct schedule to r/s.

## 2023-08-30 ENCOUNTER — Encounter (HOSPITAL_COMMUNITY): Admission: RE | Admit: 2023-08-30 | Payer: Medicare HMO | Source: Ambulatory Visit

## 2023-09-03 ENCOUNTER — Ambulatory Visit (HOSPITAL_COMMUNITY): Admit: 2023-09-03 | Payer: Medicare HMO

## 2023-09-03 ENCOUNTER — Encounter (HOSPITAL_COMMUNITY): Payer: Self-pay

## 2023-09-03 DIAGNOSIS — J440 Chronic obstructive pulmonary disease with acute lower respiratory infection: Secondary | ICD-10-CM | POA: Diagnosis not present

## 2023-09-03 DIAGNOSIS — I10 Essential (primary) hypertension: Secondary | ICD-10-CM | POA: Diagnosis not present

## 2023-09-03 SURGERY — COLONOSCOPY WITH PROPOFOL
Anesthesia: Monitor Anesthesia Care

## 2023-09-04 ENCOUNTER — Encounter: Payer: Self-pay | Admitting: *Deleted

## 2023-09-04 ENCOUNTER — Other Ambulatory Visit: Payer: Self-pay | Admitting: *Deleted

## 2023-09-04 MED ORDER — PEG 3350-KCL-NA BICARB-NACL 420 G PO SOLR: 4000.0000 mL | Freq: Once | ORAL | 0 refills | Status: AC

## 2023-09-04 NOTE — Telephone Encounter (Signed)
Pt has been rescheduled for 10/12/23. Instructions mailed and prep sent to the pharmacy

## 2023-09-05 ENCOUNTER — Encounter: Payer: Self-pay | Admitting: *Deleted

## 2023-09-24 ENCOUNTER — Emergency Department (HOSPITAL_COMMUNITY)
Admission: EM | Admit: 2023-09-24 | Discharge: 2023-09-25 | Disposition: A | Discharge status: Home / Self Care | Payer: Medicare HMO | Attending: Emergency Medicine | Admitting: Emergency Medicine

## 2023-09-24 ENCOUNTER — Emergency Department (HOSPITAL_COMMUNITY): Payer: Medicare HMO

## 2023-09-24 ENCOUNTER — Other Ambulatory Visit: Payer: Self-pay

## 2023-09-24 DIAGNOSIS — R059 Cough, unspecified: Secondary | ICD-10-CM | POA: Diagnosis present

## 2023-09-24 DIAGNOSIS — R0789 Other chest pain: Secondary | ICD-10-CM | POA: Diagnosis not present

## 2023-09-24 DIAGNOSIS — Z20822 Contact with and (suspected) exposure to covid-19: Secondary | ICD-10-CM | POA: Insufficient documentation

## 2023-09-24 DIAGNOSIS — J069 Acute upper respiratory infection, unspecified: Secondary | ICD-10-CM | POA: Diagnosis not present

## 2023-09-24 DIAGNOSIS — B9789 Other viral agents as the cause of diseases classified elsewhere: Secondary | ICD-10-CM | POA: Diagnosis not present

## 2023-09-24 DIAGNOSIS — Z8616 Personal history of COVID-19: Secondary | ICD-10-CM | POA: Insufficient documentation

## 2023-09-24 DIAGNOSIS — I700 Atherosclerosis of aorta: Secondary | ICD-10-CM | POA: Diagnosis not present

## 2023-09-24 DIAGNOSIS — R079 Chest pain, unspecified: Secondary | ICD-10-CM | POA: Diagnosis not present

## 2023-09-24 LAB — TROPONIN I (HIGH SENSITIVITY): Troponin I (High Sensitivity): 2 ng/L (ref <18)

## 2023-09-24 LAB — CBC
HCT: 40.3 % (ref 36.0–46.0)
Hemoglobin: 13 g/dL (ref 12.0–15.0)
MCH: 28.1 pg (ref 26.0–34.0)
MCHC: 32.3 g/dL (ref 30.0–36.0)
MCV: 87.2 fL (ref 80.0–100.0)
Platelets: 221 10*3/uL (ref 150–400)
RBC: 4.62 MIL/uL (ref 3.87–5.11)
RDW: 14.4 % (ref 11.5–15.5)
WBC: 9.1 10*3/uL (ref 4.0–10.5)
nRBC: 0 % (ref 0.0–0.2)

## 2023-09-24 LAB — BASIC METABOLIC PANEL
Anion gap: 6 (ref 5–15)
BUN: 9 mg/dL (ref 8–23)
CO2: 28 mmol/L (ref 22–32)
Calcium: 8.8 mg/dL — ABNORMAL LOW (ref 8.9–10.3)
Chloride: 105 mmol/L (ref 98–111)
Creatinine, Ser: 1.1 mg/dL — ABNORMAL HIGH (ref 0.44–1.00)
GFR, Estimated: 56 mL/min — ABNORMAL LOW (ref >60)
Glucose, Bld: 107 mg/dL — ABNORMAL HIGH (ref 70–99)
Potassium: 3.2 mmol/L — ABNORMAL LOW (ref 3.5–5.1)
Sodium: 139 mmol/L (ref 135–145)

## 2023-09-24 LAB — SARS CORONAVIRUS 2 BY RT PCR: SARS Coronavirus 2 by RT PCR: NEGATIVE

## 2023-09-24 NOTE — ED Triage Notes (Signed)
Pt c/o epigastric chest that is dull and hurts when she takes a breath.  CP also started today. Pain is constant and has SOB with pain

## 2023-09-24 NOTE — ED Triage Notes (Signed)
Pt has sore throat and gen body aches since this morning. Denies fever at home. Pt also states she is bloated. LBM yesterday and was hard. C/O nausea but no vomiting.

## 2023-09-24 NOTE — ED Provider Triage Note (Signed)
Emergency Medicine Provider Triage Evaluation Note  Virginia Washington , a 67 y.o. female  was evaluated in triage.  Pt complains of chest pain, cough, body aches.  Review of Systems  Positive: Cough, CP, body aches, nausea Negative: Diarrhea, vomiting  Physical Exam  BP 123/70 (BP Location: Right Arm)   Pulse 76   Temp 99.5 F (37.5 C) (Oral)   Resp 17   Ht 5\' 2"  (1.575 m)   Wt 61.2 kg   SpO2 97%   BMI 24.69 kg/m  Gen:   Awake, no distress   Resp:  Normal effort scattered rhonchi bilaterally MSK:   Moves extremities without difficulty  Other:  Cardiac: RRR  Medical Decision Making  Medically screening exam initiated at 9:54 PM.  Appropriate orders placed.  [name] was informed that the remainder of the evaluation will be completed by another provider, this initial triage assessment does not replace that evaluation, and the importance of remaining in the ED until their evaluation is complete.  Labs, EKG, CXR covid pending,    Elpidio Anis, PA-C 09/24/23 2156

## 2023-09-25 LAB — TROPONIN I (HIGH SENSITIVITY): Troponin I (High Sensitivity): 3 ng/L (ref <18)

## 2023-09-25 MED ORDER — ALBUTEROL SULFATE HFA 108 (90 BASE) MCG/ACT IN AERS: 2.0000 | INHALATION_SPRAY | Freq: Four times a day (QID) | RESPIRATORY_TRACT | 0 refills | Status: AC | PRN

## 2023-09-25 NOTE — ED Provider Notes (Signed)
[facility] AT [facility] Provider Note   CSN: [id number] Arrival date & time: 09/24/23  2109     History  Chief Complaint  Patient presents with   Generalized Body Aches   Chest Pain    Virginia Washington is a 67 y.o. female.  Patient to ED for evaluation of chest pain, body aches, nausea without vomiting, and cough. Symptoms started earlier today. She describes chest pain with cough or taking a deep breath. No known fever. No diarrhea. No known sick contacts.   The history is provided by the patient. No language interpreter was used.  Chest Pain      Home Medications Prior to Admission medications   Medication Sig Start Date End Date Taking? Authorizing Provider  albuterol (VENTOLIN HFA) 108 (90 Base) MCG/ACT inhaler Inhale 2 puffs into the lungs every 6 (six) hours as needed for wheezing or shortness of breath. 09/25/23   Elpidio Anis, PA-C  alum & mag hydroxide-simeth (MAALOX MAX) 400-400-40 MG/5ML suspension Take 15 mLs by mouth every 6 (six) hours as needed for indigestion. 06/23/22   Kommor, [place], MD  amLODipine (NORVASC) 5 MG tablet Take 5 mg by mouth daily.    [provider]  atorvastatin (LIPITOR) 20 MG tablet Take 20 mg by mouth daily. Patient not taking: Reported on 07/19/2023 10/03/22   [provider]  gabapentin (NEURONTIN) 100 MG capsule  12/13/22   [provider]  hydrochlorothiazide (HYDRODIURIL) 12.5 MG tablet Take 12.5 mg by mouth daily. 03/02/22   [provider]  ipratropium-albuterol (DUONEB) 0.5-2.5 (3) MG/3ML SOLN SMARTSIG:1 Vial(s) Via Nebulizer Every 4-6 Hours PRN 09/08/22   [provider]  linaclotide (LINZESS) 290 MCG CAPS capsule Take 1 capsule (290 mcg total) by mouth daily before breakfast. Patient not taking: Reported on 07/19/2023 04/20/22 04/20/23  Lanelle Bal, DO  pantoprazole (PROTONIX) 40 MG tablet Take 1 tablet (40 mg total) by mouth 2 (two) times daily before a  meal. 07/19/23   Letta Median, PA-C  simethicone (MYLICON) 125 MG chewable tablet Chew 1 tablet after each meal and at bedtime if needed Patient not taking: Reported on 07/19/2023 01/12/22   Lanelle Bal, DO  TRELEGY ELLIPTA 100-62.5-25 MCG/ACT AEPB  12/13/22   [provider]      Allergies    Patient has no known allergies.    Review of Systems   Review of Systems  Cardiovascular:  Positive for chest pain.    Physical Exam Updated Vital Signs BP 123/70 (BP Location: Right Arm)   Pulse 76   Temp 99.5 F (37.5 C) (Oral)   Resp 17   Ht 5\' 2"  (1.575 m)   Wt 61.2 kg   SpO2 97%   BMI 24.69 kg/m  Physical Exam Vitals and nursing note reviewed.  Constitutional:      Appearance: She is well-developed.  HENT:     Head: Normocephalic.  Cardiovascular:     Rate and Rhythm: Normal rate and regular rhythm.     Heart sounds:     No systolic murmur is present.  Pulmonary:     Effort: Pulmonary effort is normal.     Breath sounds: Normal breath sounds.  Chest:     Chest wall: No tenderness.  Abdominal:     Palpations: Abdomen is soft.  Musculoskeletal:        General: Normal range of motion.     Cervical back: Normal range of motion.     Right  lower leg: No edema.     Left lower leg: No edema.  Skin:    General: Skin is warm and dry.  Neurological:     General: No focal deficit present.     Mental Status: She is alert and oriented to person, place, and time.     ED Results / Procedures / Treatments   Labs (all labs ordered are listed, but only abnormal results are displayed) Labs Reviewed  BASIC METABOLIC PANEL - Abnormal; Notable for the following components:      Result Value   Potassium 3.2 (*)    Glucose, Bld 107 (*)    Creatinine, Ser 1.10 (*)    Calcium 8.8 (*)    GFR, Estimated 56 (*)    All other components within normal limits  SARS CORONAVIRUS 2 BY RT PCR  CBC  TROPONIN I (HIGH SENSITIVITY)  TROPONIN I (HIGH SENSITIVITY)   Results for  orders placed or performed during the hospital encounter of 09/24/23 (from the past 24 hour(s))  Basic metabolic panel     Status: Abnormal   Collection Time: 09/24/23  9:43 PM  Result Value Ref Range   Sodium 139 135 - 145 mmol/L   Potassium 3.2 (L) 3.5 - 5.1 mmol/L   Chloride 105 98 - 111 mmol/L   CO2 28 22 - 32 mmol/L   Glucose, Bld 107 (H) 70 - 99 mg/dL   BUN 9 8 - 23 mg/dL   Creatinine, Ser 1.61 (H) 0.44 - 1.00 mg/dL   Calcium 8.8 (L) 8.9 - 10.3 mg/dL   GFR, Estimated 56 (L) >60 mL/min   Anion gap 6 5 - 15  CBC     Status: None   Collection Time: 09/24/23  9:43 PM  Result Value Ref Range   WBC 9.1 4.0 - 10.5 K/uL   RBC 4.62 3.87 - 5.11 MIL/uL   Hemoglobin 13.0 12.0 - 15.0 g/dL   HCT 09.6 04.5 - 40.9 %   MCV 87.2 80.0 - 100.0 fL   MCH 28.1 26.0 - 34.0 pg   MCHC 32.3 30.0 - 36.0 g/dL   RDW 81.1 91.4 - 78.2 %   Platelets 221 150 - 400 K/uL   nRBC 0.0 0.0 - 0.2 %  Troponin I (High Sensitivity)     Status: None   Collection Time: 09/24/23  9:43 PM  Result Value Ref Range   Troponin I (High Sensitivity) 2 <18 ng/L  SARS Coronavirus 2 by RT PCR (hospital order, performed in [facility] hospital lab) *cepheid single result test* Anterior Nasal Swab     Status: None   Collection Time: 09/24/23 10:41 PM   Specimen: Anterior Nasal Swab  Result Value Ref Range   SARS Coronavirus 2 by RT PCR NEGATIVE NEGATIVE    EKG EKG Interpretation Date/Time:  Monday September 24 2023 21:34:54 EDT Ventricular Rate:  76 PR Interval:  170 QRS Duration:  74 QT Interval:  354 QTC Calculation: 398 R Axis:   60  Text Interpretation: Normal sinus rhythm Normal ECG Interpretation limited secondary to artifact Confirmed by Zadie Rhine ([id number]) on 09/24/2023 11:27:19 PM  Radiology DG Chest 2 View  Result Date: 09/24/2023 CLINICAL DATA:  Chest pain. EXAM: CHEST - 2 VIEW COMPARISON:  Chest radiograph dated 04/22/2023. FINDINGS: No focal consolidation, pleural effusion, or pneumothorax. Mild  diffuse chronic interstitial coarsening. The cardiac silhouette is within normal limits. Atherosclerotic calcification of the aortic arch. No acute osseous pathology. IMPRESSION: No active cardiopulmonary disease. Electronically Signed  By: Elgie Collard M.D.   On: 09/24/2023 22:54    Procedures Procedures    Medications Ordered in ED Medications - No data to display  ED Course/ Medical Decision Making/ A&P Clinical Course as of 09/25/23 0021  Tue Sep 25, 2023  0015 Patient to ED with URI symptoms associated with chest pain starting earlier today. She is well appearing, in NAD. Exam reassuring.  Labs reviewed: mildly low potassium of 3.2; cr 1.10; no WBC elevation. CXR reviewed and shows no infiltrates, or edema. COVID negative. EKG nonacute. Initial troponin negative at 2. Second troponin pending. Patient care signed out to Dr. Bebe Shaggy pending troponin result. Anticipate discharge with URI requiring supportive care.  [SU]    Clinical Course User Index [SU] Elpidio Anis, PA-C                                 Medical Decision Making Amount and/or Complexity of Data Reviewed Labs: ordered. Radiology: ordered.           Final Clinical Impression(s) / ED Diagnoses Final diagnoses:  Viral upper respiratory tract infection    Rx / DC Orders ED Discharge Orders          Ordered    albuterol (VENTOLIN HFA) 108 (90 Base) MCG/ACT inhaler  Every 6 hours PRN        09/25/23 0019              Elpidio Anis, PA-C 09/25/23 0021    Zadie Rhine, MD 09/25/23 [phone]

## 2023-09-25 NOTE — Discharge Instructions (Addendum)
Use the Albuterol inhaler 2 puffs every 4 hours if this helps with cough or chest discomfort. Recommend Tylenol and/or ibuprofen for body aches or if any fever develops.   Follow up with your doctor for recheck in 2-3 days.

## 2023-10-02 DIAGNOSIS — Z23 Encounter for immunization: Secondary | ICD-10-CM | POA: Diagnosis not present

## 2023-10-03 DIAGNOSIS — I10 Essential (primary) hypertension: Secondary | ICD-10-CM | POA: Diagnosis not present

## 2023-10-03 DIAGNOSIS — J440 Chronic obstructive pulmonary disease with acute lower respiratory infection: Secondary | ICD-10-CM | POA: Diagnosis not present

## 2023-10-05 NOTE — Progress Notes (Deleted)
Name: Virginia Washington DOB: [birth year] MRN: [id number]  History of Present Illness: Virginia Washington is a 67 y.o. female who presents today as a new patient at [facility] [place]. All available relevant medical records have been reviewed.   She {Actions; denies-reports:120008} urge incontinence. She {Actions; denies-reports:120008} stress incontinence with ***cough/***laugh/***sneeze/***heavy lifting /***exercise. She reports the ***SUI / ***UUI is predominant.  She leaks *** times per ***. Wears *** ***pads/ ***diapers per day. She reports urinary incontinence {ACTION; IS/IS [id number]} significantly bothersome.   She reports urinary ***frequency, ***nocturia, ***urgency, and ***urge incontinence. Voiding ***x/day and ***x/night on average. Leaking ***x/day on average; using *** ***pads / ***diapers per day on average.  She {Actions; denies-reports:120008} prior attempted treatment for these symptoms ***including ***  She {Actions; denies-reports:120008} caffeine intake (*** caffeinated beverages per day on average).  She {Actions; denies-reports:120008} history of obstructive sleep apnea and {Actions; denies-reports:120008} wearing CPAP routinely. ***She denies ever having a sleep study before. She {Actions; denies-reports:120008} fluid intake within 3 hours prior to bedtime. She {Actions; denies-reports:120008} fluid intake during the night.  She {Actions; denies-reports:120008} caffeine intake within 8 hours prior to bedtime. She {Actions; denies-reports:120008} routinely experiencing lower extremity edema during the day. ***She {Actions; denies-reports:120008} elevating feet during the day and/or wearing compression socks when lower extremity edema is present during the day. ***She {Actions; denies-reports:120008} monitoring dietary salt and sodium intake.  She {Actions; denies-reports:120008} dysuria, gross hematuria, straining to void, or sensations of incomplete  emptying.   Fall Screening: Do you usually have a device to assist in your mobility? {yes/no:20286} ***cane / ***walker / ***wheelchair  Medications: Current Outpatient Medications  Medication Sig Dispense Refill   albuterol (VENTOLIN HFA) 108 (90 Base) MCG/ACT inhaler Inhale 2 puffs into the lungs every 6 (six) hours as needed for wheezing or shortness of breath. 1 each 0   alum & mag hydroxide-simeth (MAALOX MAX) 400-400-40 MG/5ML suspension Take 15 mLs by mouth every 6 (six) hours as needed for indigestion. 355 mL 0   amLODipine (NORVASC) 5 MG tablet Take 5 mg by mouth daily.     atorvastatin (LIPITOR) 20 MG tablet Take 20 mg by mouth daily. (Patient not taking: Reported on 07/19/2023)     gabapentin (NEURONTIN) 100 MG capsule      hydrochlorothiazide (HYDRODIURIL) 12.5 MG tablet Take 12.5 mg by mouth daily.     ipratropium-albuterol (DUONEB) 0.5-2.5 (3) MG/3ML SOLN SMARTSIG:1 Vial(s) Via Nebulizer Every 4-6 Hours PRN     linaclotide (LINZESS) 290 MCG CAPS capsule Take 1 capsule (290 mcg total) by mouth daily before breakfast. (Patient not taking: Reported on 07/19/2023) 90 capsule 3   pantoprazole (PROTONIX) 40 MG tablet Take 1 tablet (40 mg total) by mouth 2 (two) times daily before a meal. 60 tablet 3   simethicone (MYLICON) 125 MG chewable tablet Chew 1 tablet after each meal and at bedtime if needed (Patient not taking: Reported on 07/19/2023) 120 tablet 5   TRELEGY ELLIPTA 100-62.5-25 MCG/ACT AEPB      No current facility-administered medications for this visit.    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  Normal right aortoiliac vessels.  Three-vessel runoff bilaterally.  (Sluggish flow of the left side due to occlusion.   ABDOMINAL HYSTERECTOMY     BALLOON DILATION N/A 10/05/2020   Procedure: BALLOON DILATION;  Surgeon: Lanelle Bal, DO;  Location: [facility];  Service: Endoscopy;  Laterality: N/A;   BALLOON DILATION N/A 07/17/2022   Procedure: BALLOON DILATION;  Surgeon: Lanelle Bal, DO;  Location: [facility];  Service: Endoscopy;  Laterality: N/A;   BIOPSY  10/05/2020   Procedure: BIOPSY;  Surgeon: Lanelle Bal, DO;  Location: [facility];  Service: Endoscopy;;   BIOPSY  07/17/2022   Procedure: BIOPSY;  Surgeon: Lanelle Bal, DO;  Location: [facility];  Service: Endoscopy;;   CHOLECYSTECTOMY     COLONOSCOPY  2007   Dr. Karilyn Cota: small internal hemorrhoids, otherwise normal. exam could be compromised due to quality of prep   COLONOSCOPY N/A 02/15/2018   Dr. Darrick Penna: 2 simple adenomas, external and internal hemorrhoids. Next colonoscopy 2024-2026   ESOPHAGOGASTRODUODENOSCOPY N/A 05/09/2018   Procedure: ESOPHAGOGASTRODUODENOSCOPY (EGD);  Surgeon: West Bali, MD; web in the distal esophagus s/p dilated, mild gastritis biopsied, normal examined duodenum. Pathology with gastritis, negative for H. Pylori.     ESOPHAGOGASTRODUODENOSCOPY (EGD) WITH PROPOFOL N/A 10/05/2020   Surgeon: Lanelle Bal, DO; small hiatal hernia, grade B reflux esophagitis, benign-appearing esophageal stenosis dilated, gastritis biopsied- mild nonspecific reactive gastropathy, negative for H. pylori.   ESOPHAGOGASTRODUODENOSCOPY (EGD) WITH PROPOFOL N/A 07/17/2022   Procedure: ESOPHAGOGASTRODUODENOSCOPY (EGD) WITH PROPOFOL;   Surgeon: Lanelle Bal, DO;  Location: [facility];  Service: Endoscopy;  Laterality: N/A;  9:45am   FEMORAL-FEMORAL BYPASS GRAFT Bilateral 10/21/2018   Procedure: RIGHT TO LEFT FEMORAL ARTERY BYPASS GRAFT;  Surgeon: Sherren Kerns, MD;  Location: [facility] OR;  Service: Vascular;  Laterality: Bilateral;   LOWER EXTREMITY ANGIOGRAPHY N/A 09/27/2018   Procedure: LOWER EXTREMITY ANGIOGRAPHY;  Surgeon: Sherren Kerns, MD;  Location: [facility];  Service: Cardiovascular;  Laterality: N/A;   NM MYOVIEW LTD  12/2014   [facility]: Normal Myoview.  Nonischemic.   TRANSTHORACIC ECHOCARDIOGRAM  12/2011   EF 55 to 60%.  No regional wall motion normality.  GRII DD.  No significant valve disease.   VASCULAR SURGERY     Family History  Problem Relation Age of Onset   Hypertension Mother    Peripheral Artery Disease Brother    Colon cancer Neg Hx    Colon polyps Neg Hx    Social History   Socioeconomic History   Marital status: Widowed    Spouse name: Not on file   Number of children: Not on file   Years of education: Not on file   Highest education level: Not on file  Occupational History   Occupation: unemployed    Comment: Draws pension from her husband's life insurance policy  Tobacco Use   Smoking status: Every Day    Current packs/day: 0.50    Average packs/day: 0.5 packs/day for 40.0 years (20.0 ttl pk-yrs)    Types: Cigarettes   Smokeless tobacco: Never  Vaping Use   Vaping status: Never Used  Substance and Sexual Activity   Alcohol use: No  Alcohol/week: 0.0 standard drinks of alcohol   Drug use: No   Sexual activity: Not Currently  Other Topics Concern   Not on file  Social History Narrative   Widowed: GETS HUSBAND'S PENSION.  mother 3, grandmother of 36 with one great grandchild.  She currently lives alone.  She completed ninth grade.  Smokes a pack a day for 42 years.  Tries to walks for about 5 minutes at x7 days a week with plans to help  revascularization.       HOBBIES: PUZZLE BOOKS.   Social Determinants of Health   Financial Resource Strain: Not on file  Food Insecurity: Not on file  Transportation Needs: Not on file  Physical Activity: Not on file  Stress: Not on file  Social Connections: Not on file  Intimate Partner Violence: Not on file    SUBJECTIVE  Review of Systems*** Constitutional: Patient denies any unintentional weight loss or change in strength lntegumentary: Patient denies any rashes or pruritus Eyes: Patient denies ***dry eyes ENT: Patient ***denies dry mouth Cardiovascular: Patient denies chest pain or syncope Respiratory: Patient denies shortness of breath Gastrointestinal: Patient ***denies nausea, vomiting, constipation, or diarrhea Musculoskeletal: Patient denies muscle cramps or weakness Neurologic: Patient denies convulsions or seizures Allergic/Immunologic: Patient denies recent allergic reaction(s) Hematologic/Lymphatic: Patient denies bleeding tendencies Endocrine: Patient denies heat/cold intolerance  GU: As per HPI.  OBJECTIVE There were no vitals filed for this visit. There is no height or weight on file to calculate BMI.  Physical Examination*** Constitutional: No obvious distress; patient is non-toxic appearing  Cardiovascular: No visible lower extremity edema.  Respiratory: The patient does not have audible wheezing/stridor; respirations do not appear labored  Gastrointestinal: Abdomen non-distended Musculoskeletal: Normal ROM of UEs  Skin: No obvious rashes/open sores  Neurologic: CN 2-12 grossly intact Psychiatric: Answered questions appropriately with normal affect  Hematologic/Lymphatic/Immunologic: No obvious bruises or sites of spontaneous bleeding  UA: ***negative *** WBC/hpf, *** RBC/hpf, bacteria (***) PVR: *** ml  ASSESSMENT No diagnosis found. ***  Will plan for follow up in *** months or sooner if needed. Pt verbalized understanding and agreement.  All questions were answered.  PLAN Advised the following: *** ***No follow-ups on file.  No orders of the defined types were placed in this encounter.   It has been explained that the patient is to follow regularly with their PCP in addition to all other providers involved in their care and to follow instructions provided by these respective offices. Patient advised to contact urology clinic if any urologic-pertaining questions, concerns, new symptoms or problems arise in the interim period.  There are no Patient Instructions on file for this visit.  Electronically signed by:  Donnita Falls, MSN, FNP-C, CUNP 10/05/2023 12:50 PM

## 2023-10-09 ENCOUNTER — Encounter (HOSPITAL_COMMUNITY): Payer: Self-pay

## 2023-10-09 ENCOUNTER — Encounter (HOSPITAL_COMMUNITY)
Admission: RE | Admit: 2023-10-09 | Discharge: 2023-10-09 | Disposition: A | Discharge status: Home / Self Care | Payer: Medicare HMO | Source: Ambulatory Visit | Attending: Internal Medicine | Admitting: Internal Medicine

## 2023-10-09 NOTE — Patient Instructions (Signed)
[place]  10/09/2023     @PREFPERIOPPHARMACY @   Your procedure is scheduled on 10/12/2023.  Report to [facility] at 8:15 A.M.  Call this number if you have problems the morning of surgery:  [phone]  If you experience any cold or flu symptoms such as cough, fever, chills, shortness of breath, etc. between now and your scheduled surgery, please notify us at the above number.   Remember:   Please follow the diet and prep instructions given to you by Dr [facility] office.      Take these medicines the morning of surgery with A SIP OF WATER : Amlodipine Gabapentin Pantoprazole     Do not wear jewelry, make-up or nail polish, including gel polish,  artificial nails, or any other type of covering on natural nails (fingers and  toes).  Do not wear lotions, powders, or perfumes, or deodorant.  Do not shave 48 hours prior to surgery.  Men may shave face and neck.  Do not bring valuables to the hospital.  [facility] is not responsible for any belongings or valuables.  Contacts, dentures or bridgework may not be worn into surgery.  Leave your suitcase in the car.  After surgery it may be brought to your room.  For patients admitted to the hospital, discharge time will be determined by your treatment team.  Patients discharged the day of surgery will not be allowed to drive home.   Name and phone number of your driver:   Family Special instructions:  N/A  Please read over the following fact sheets that you were given. Care and Recovery After Surgery  Upper Endoscopy, Adult Upper endoscopy is a procedure to look inside the upper GI (gastrointestinal) tract. The upper GI tract is made up of: The esophagus. This is the part of the body that moves food from your mouth to your stomach. The stomach. The duodenum. This is the first part of your small intestine. This procedure is also called esophagogastroduodenoscopy (EGD) or gastroscopy. In this procedure, your health care  provider passes a thin, flexible tube (endoscope) through your mouth and down your esophagus into your stomach and into your duodenum. A small camera is attached to the end of the tube. Images from the camera appear on a monitor in the exam room. During this procedure, your health care provider may also remove a small piece of tissue to be sent to a lab and examined under a microscope (biopsy). Your health care provider may do an upper endoscopy to diagnose cancers of the upper GI tract. You may also have this procedure to find the cause of other conditions, such as: Stomach pain. Heartburn. Pain or problems when swallowing. Nausea and vomiting. Stomach bleeding. Stomach ulcers. Tell a health care provider about: Any allergies you have. All medicines you are taking, including vitamins, herbs, eye drops, creams, and over-the-counter medicines. Any problems you or family members have had with anesthetic medicines. Any bleeding problems you have. Any surgeries you have had. Any medical conditions you have. Whether you are pregnant or may be pregnant. What are the risks? Your healthcare provider will talk with you about risks. These may include: Infection. Bleeding. Allergic reactions to medicines. A tear or hole (perforation) in the esophagus, stomach, or duodenum. What happens before the procedure? When to stop eating and drinking Follow instructions from your health care provider about what you may eat and drink. These may include: 8 hours before your procedure Stop eating most foods. Do not  eat meat, fried foods, or fatty foods. Eat only light foods, such as toast or crackers. All liquids are okay except energy drinks and alcohol. 6 hours before your procedure Stop eating. Drink only clear liquids, such as water, clear fruit juice, black coffee, plain tea, and sports drinks. Do not drink energy drinks or alcohol. 2 hours before your procedure Stop drinking all liquids. You may be  allowed to take medicines with small sips of water. If you do not follow your health care provider's instructions, your procedure may be delayed or canceled. Medicines Ask your health care provider about: Changing or stopping your regular medicines. This is especially important if you are taking diabetes medicines or blood thinners. Taking medicines such as aspirin and ibuprofen. These medicines can thin your blood. Do not take these medicines unless your health care provider tells you to take them. Taking over-the-counter medicines, vitamins, herbs, and supplements. General instructions If you will be going home right after the procedure, plan to have a responsible adult: Take you home from the hospital or clinic. You will not be allowed to drive. Care for you for the time you are told. What happens during the procedure?  An IV will be inserted into one of your veins. You may be given one or more of the following: A medicine to help you relax (sedative). A medicine to numb the throat (local anesthetic). You will lie on your left side on an exam table. Your health care provider will pass the endoscope through your mouth and down your esophagus. Your health care provider will use the scope to check the inside of your esophagus, stomach, and duodenum. Biopsies may be taken. The endoscope will be removed. The procedure may vary among health care providers and hospitals. What happens after the procedure? Your blood pressure, heart rate, breathing rate, and blood oxygen level will be monitored until you leave the hospital or clinic. When your throat is no longer numb, you may be given some fluids to drink. If you were given a sedative during the procedure, it can affect you for several hours. Do not drive or operate machinery until your health care provider says that it is safe. It is up to you to get the results of your procedure. Ask your health care provider, or the department that is doing  the procedure, when your results will be ready. Contact a health care provider if you: Have a sore throat that lasts longer than 1 day. Have a fever. Get help right away if you: Vomit blood or your vomit looks like coffee grounds. Have bloody, black, or tarry stools. Have a very bad sore throat or you cannot swallow. Have difficulty breathing or very bad pain in your chest or abdomen. These symptoms may be an emergency. Get help right away. Call 911. Do not wait to see if the symptoms will go away. Do not drive yourself to the hospital. Summary Upper endoscopy is a procedure to look inside the upper GI tract. During the procedure, an IV will be inserted into one of your veins. You may be given a medicine to help you relax. The endoscope will be passed through your mouth and down your esophagus. Follow instructions from your health care provider about what you can eat and drink. This information is not intended to replace advice given to you by your health care provider. Make sure you discuss any questions you have with your health care provider. Document Revised: 03/15/2022 Document Reviewed: 03/15/2022 Elsevier Patient Education  2024 [employer]  Colonoscopy, Adult A colonoscopy is a procedure to look at the entire large intestine. This procedure is done using a long, thin, flexible tube that has a camera on the end. You may have a colonoscopy: As a part of normal colorectal screening. If you have certain symptoms, such as: A low number of red blood cells in your blood (anemia). Diarrhea that does not go away. Pain in your abdomen. Blood in your stool. A colonoscopy can help screen for and diagnose medical problems, including: An abnormal growth of cells or tissue (tumor). Abnormal growths within the lining of your intestine (polyps). Inflammation. Areas of bleeding. Tell your health care provider about: Any allergies you have. All medicines you are taking, including  vitamins, herbs, eye drops, creams, and over-the-counter medicines. Any problems you or family members have had with anesthetic medicines. Any bleeding problems you have. Any surgeries you have had. Any medical conditions you have. Any problems you have had with having bowel movements. Whether you are pregnant or may be pregnant. What are the risks? Generally, this is a safe procedure. However, problems may occur, including: Bleeding. Damage to your intestine. Allergic reactions to medicines given during the procedure. Infection. This is rare. What happens before the procedure? Eating and drinking restrictions Follow instructions from your health care provider about eating or drinking restrictions, which may include: A few days before the procedure: Follow a low-fiber diet. Avoid nuts, seeds, dried fruit, raw fruits, and vegetables. 1-3 days before the procedure: Eat only gelatin dessert or ice pops. Drink only clear liquids, such as water, clear juice, clear broth or bouillon, black coffee or tea, or clear soft drinks or sports drinks. Avoid liquids that contain red or purple dye. The day of the procedure: Do not eat solid foods. You may continue to drink clear liquids until up to 2 hours before the procedure. Do not eat or drink anything starting 2 hours before the procedure, or within the time period that your health care provider recommends. Bowel prep If you were prescribed a bowel prep to take by mouth (orally) to clean out your colon: Take it as told by your health care provider. Starting the day before your procedure, you will need to drink a large amount of liquid medicine. The liquid will cause you to have many bowel movements of loose stool until your stool becomes almost clear or light green. If your skin or the opening between the buttocks (anus) gets irritated from diarrhea, you may relieve the irritation using: Wipes with medicine in them, such as adult wet wipes with  aloe and vitamin E. A product to soothe skin, such as petroleum jelly. If you vomit while drinking the bowel prep: Take a break for up to 60 minutes. Begin the bowel prep again. Call your health care provider if you keep vomiting or you cannot take the bowel prep without vomiting. To clean out your colon, you may also be given: Laxative medicines. These help you have a bowel movement. Instructions for enema use. An enema is liquid medicine injected into your rectum. Medicines Ask your health care provider about: Changing or stopping your regular medicines or supplements. This is especially important if you are taking iron supplements, diabetes medicines, or blood thinners. Taking medicines such as aspirin and ibuprofen. These medicines can thin your blood. Do not take these medicines unless your health care provider tells you to take them. Taking over-the-counter medicines, vitamins, herbs, and supplements. General instructions Ask your health care  provider what steps will be taken to help prevent infection. These may include washing skin with a germ-killing soap. If you will be going home right after the procedure, plan to have a responsible adult: Take you home from the hospital or clinic. You will not be allowed to drive. Care for you for the time you are told. What happens during the procedure?  An IV will be inserted into one of your veins. You will be given a medicine to make you fall asleep (general anesthetic). You will lie on your side with your knees bent. A lubricant will be put on the tube. Then the tube will be: Inserted into your anus. Gently eased through all parts of your large intestine. Air will be sent into your colon to keep it open. This may cause some pressure or cramping. Images will be taken with the camera and will appear on a screen. A small tissue sample may be removed to be looked at under a microscope (biopsy). The tissue may be sent to a lab for testing if  any signs of problems are found. If small polyps are found, they may be removed and checked for cancer cells. When the procedure is finished, the tube will be removed. The procedure may vary among health care providers and hospitals. What happens after the procedure? Your blood pressure, heart rate, breathing rate, and blood oxygen level will be monitored until you leave the hospital or clinic. You may have a small amount of blood in your stool. You may pass gas and have mild cramping or bloating in your abdomen. This is caused by the air that was used to open your colon during the exam. If you were given a sedative during the procedure, it can affect you for several hours. Do not drive or operate machinery until your health care provider says that it is safe. It is up to you to get the results of your procedure. Ask your health care provider, or the department that is doing the procedure, when your results will be ready. Summary A colonoscopy is a procedure to look at the entire large intestine. Follow instructions from your health care provider about eating and drinking before the procedure. If you were prescribed an oral bowel prep to clean out your colon, take it as told by your health care provider. During the colonoscopy, a flexible tube with a camera on its end is inserted into the anus and then passed into all parts of the large intestine. This information is not intended to replace advice given to you by your health care provider. Make sure you discuss any questions you have with your health care provider. Document Revised: 01/16/2023 Document Reviewed: 07/27/2021 Elsevier Patient Education  2024 [employer]   Esophageal Dilatation Esophageal dilatation, also called esophageal dilation, is a procedure to widen or open a blocked or narrowed part of the esophagus. The esophagus is the part of the body that moves food and liquid from the mouth to the stomach. You may need this procedure  if: You have a buildup of scar tissue in your esophagus that makes it difficult, painful, or impossible to swallow. This can be caused by gastroesophageal reflux disease (GERD). You have cancer of the esophagus. There is a problem with how food moves through your esophagus. In some cases, you may need this procedure repeated at a later time to dilate the esophagus gradually. Tell a health care provider about: Any allergies you have. All medicines you are taking, including vitamins,  herbs, eye drops, creams, and over-the-counter medicines. Any problems you or family members have had with anesthetic medicines. Any blood disorders you have. Any surgeries you have had. Any medical conditions you have. Any antibiotic medicines you are required to take before dental procedures. Whether you are pregnant or may be pregnant. What are the risks? Generally, this is a safe procedure. However, problems may occur, including: Bleeding due to a tear in the lining of the esophagus. A hole, or perforation, in the esophagus. What happens before the procedure? Ask your health care provider about: Changing or stopping your regular medicines. This is especially important if you are taking diabetes medicines or blood thinners. Taking medicines such as aspirin and ibuprofen. These medicines can thin your blood. Do not take these medicines unless your health care provider tells you to take them. Taking over-the-counter medicines, vitamins, herbs, and supplements. Follow instructions from your health care provider about eating or drinking restrictions. Plan to have a responsible adult take you home from the hospital or clinic. Plan to have a responsible adult care for you for the time you are told after you leave the hospital or clinic. This is important. What happens during the procedure? You may be given a medicine to help you relax (sedative). A numbing medicine may be sprayed into the back of your throat, or  you may gargle the medicine. Your health care provider may perform the dilatation using various surgical instruments, such as: Simple dilators. This instrument is carefully placed in the esophagus to stretch it. Guided wire bougies. This involves using an endoscope to insert a wire into the esophagus. A dilator is passed over this wire to enlarge the esophagus. Then the wire is removed. Balloon dilators. An endoscope with a small balloon is inserted into the esophagus. The balloon is inflated to stretch the esophagus and open it up. The procedure may vary among health care providers and hospitals. What can I expect after the procedure? Your blood pressure, heart rate, breathing rate, and blood oxygen level will be monitored until you leave the hospital or clinic. Your throat may feel slightly sore and numb. This will get better over time. You will not be allowed to eat or drink until your throat is no longer numb. When you are able to drink, urinate, and sit on the edge of the bed without nausea or dizziness, you may be able to return home. Follow these instructions at home: Take over-the-counter and prescription medicines only as told by your health care provider. If you were given a sedative during the procedure, it can affect you for several hours. Do not drive or operate machinery until your health care provider says that it is safe. Plan to have a responsible adult care for you for the time you are told. This is important. Follow instructions from your health care provider about any eating or drinking restrictions. Do not use any products that contain nicotine or tobacco, such as cigarettes, e-cigarettes, and chewing tobacco. If you need help quitting, ask your health care provider. Keep all follow-up visits. This is important. Contact a health care provider if: You have a fever. You have pain that is not relieved by medicine. Get help right away if: You have chest pain. You have trouble  breathing. You have trouble swallowing. You vomit blood. You have black, tarry, or bloody stools. These symptoms may represent a serious problem that is an emergency. Do not wait to see if the symptoms will go away. Get medical help  right away. Call your local emergency services (911 in the [place] Do not drive yourself to the hospital. Summary Esophageal dilatation, also called esophageal dilation, is a procedure to widen or open a blocked or narrowed part of the esophagus. Plan to have a responsible adult take you home from the hospital or clinic. For this procedure, a numbing medicine may be sprayed into the back of your throat, or you may gargle the medicine. Do not drive or operate machinery until your health care provider says that it is safe. This information is not intended to replace advice given to you by your health care provider. Make sure you discuss any questions you have with your health care provider. Document Revised: 04/21/2020 Document Reviewed: 04/21/2020 Elsevier Patient Education  2024 [employer]  Monitored Anesthesia Care Anesthesia refers to the techniques, procedures, and medicines that help a person stay safe and comfortable during surgery. Monitored anesthesia care, or sedation, is one type of anesthesia. You may have sedation if you do not need to be asleep for your procedure. Procedures that use sedation may include: Surgery to remove cataracts from your eyes. A dental procedure. A biopsy. This is when a tissue sample is removed and looked at under a microscope. You will be watched closely during your procedure. Your level of sedation or type of anesthesia may be changed to fit your needs. Tell a health care provider about: Any allergies you have. All medicines you are taking, including vitamins, herbs, eye drops, creams, and over-the-counter medicines. Any problems you or family members have had with anesthesia. Any bleeding problems you have. Any surgeries  you have had. Any medical conditions or illnesses you have. This includes sleep apnea, cough, fever, or the flu. Whether you are pregnant or may be pregnant. Whether you use cigarettes, alcohol, or drugs. Any use of steroids, whether by mouth or as a cream. What are the risks? Your health care provider will talk with you about risks. These may include: Getting too much medicine (oversedation). Nausea. Allergic reactions to medicines. Trouble breathing. If this happens, a breathing tube may be used to help you breathe. It will be removed when you are awake and breathing on your own. Heart trouble. Lung trouble. Confusion that gets better with time (emergence delirium). What happens before the procedure? When to stop eating and drinking Follow instructions from your health care provider about what you may eat and drink. These may include: 8 hours before your procedure Stop eating most foods. Do not eat meat, fried foods, or fatty foods. Eat only light foods, such as toast or crackers. All liquids are okay except energy drinks and alcohol. 6 hours before your procedure Stop eating. Drink only clear liquids, such as water, clear fruit juice, black coffee, plain tea, and sports drinks. Do not drink energy drinks or alcohol. 2 hours before your procedure Stop drinking all liquids. You may be allowed to take medicines with small sips of water. If you do not follow your health care provider's instructions, your procedure may be delayed or canceled. Medicines Ask your health care provider about: Changing or stopping your regular medicines. These include any diabetes medicines or blood thinners you take. Taking medicines such as aspirin and ibuprofen. These medicines can thin your blood. Do not take them unless your health care provider tells you to. Taking over-the-counter medicines, vitamins, herbs, and supplements. Testing You may have an exam or testing. You may have a blood or urine  sample taken. General instructions Do not use  any products that contain nicotine or tobacco for at least 4 weeks before the procedure. These products include cigarettes, chewing tobacco, and vaping devices, such as e-cigarettes. If you need help quitting, ask your health care provider. If you will be going home right after the procedure, plan to have a responsible adult: Take you home from the hospital or clinic. You will not be allowed to drive. Care for you for the time you are told. What happens during the procedure?  Your blood pressure, heart rate, breathing, level of pain, and blood oxygen level will be monitored. An IV will be inserted into one of your veins. You may be given: A sedative. This helps you relax. Anesthesia. This will: Numb certain areas of your body. Make you fall asleep for surgery. You will be given medicines as needed to keep you comfortable. The more medicine you are given, the deeper your level of sedation will be. Your level of sedation may be changed to fit your needs. There are three levels of sedation: Mild sedation. At this level, you may feel awake and relaxed. You will be able to follow directions. Moderate sedation. At this level, you will be sleepy. You may not remember the procedure. Deep sedation. At this level, you will be asleep. You will not remember the procedure. How you get the medicines will depend on your age and the procedure. They may be given as: A pill. This may be taken by mouth (orally) or inserted into the rectum. An injection. This may be into a vein or muscle. A spray through the nose. After your procedure is over, the medicine will be stopped. The procedure may vary among health care providers and hospitals. What happens after the procedure? Your blood pressure, heart rate, breathing rate, and blood oxygen level will be monitored until you leave the hospital or clinic. You may feel sleepy, clumsy, or nauseous. You may not remember  what happened during or after the procedure. Sedation can affect you for several hours. Do not drive or use machinery until your health care provider says that it is safe. This information is not intended to replace advice given to you by your health care provider. Make sure you discuss any questions you have with your health care provider. Document Revised: 05/01/2022 Document Reviewed: 05/01/2022 Elsevier Patient Education  2024 [employer].

## 2023-10-12 ENCOUNTER — Encounter (HOSPITAL_COMMUNITY)
Admission: RE | Disposition: A | Discharge status: Home / Self Care | Payer: Self-pay | Source: Home / Self Care | Attending: Internal Medicine

## 2023-10-12 ENCOUNTER — Ambulatory Visit (HOSPITAL_COMMUNITY): Payer: Medicare HMO | Admitting: Anesthesiology

## 2023-10-12 ENCOUNTER — Encounter (HOSPITAL_COMMUNITY): Payer: Self-pay

## 2023-10-12 ENCOUNTER — Ambulatory Visit (HOSPITAL_COMMUNITY)
Admission: RE | Admit: 2023-10-12 | Discharge: 2023-10-12 | Disposition: A | Discharge status: Home / Self Care | Payer: Medicare HMO | Attending: Internal Medicine | Admitting: Internal Medicine

## 2023-10-12 DIAGNOSIS — K2289 Other specified disease of esophagus: Secondary | ICD-10-CM

## 2023-10-12 DIAGNOSIS — Z86718 Personal history of other venous thrombosis and embolism: Secondary | ICD-10-CM | POA: Diagnosis not present

## 2023-10-12 DIAGNOSIS — J449 Chronic obstructive pulmonary disease, unspecified: Secondary | ICD-10-CM

## 2023-10-12 DIAGNOSIS — D123 Benign neoplasm of transverse colon: Secondary | ICD-10-CM | POA: Insufficient documentation

## 2023-10-12 DIAGNOSIS — K3189 Other diseases of stomach and duodenum: Secondary | ICD-10-CM

## 2023-10-12 DIAGNOSIS — K319 Disease of stomach and duodenum, unspecified: Secondary | ICD-10-CM | POA: Diagnosis not present

## 2023-10-12 DIAGNOSIS — R1310 Dysphagia, unspecified: Secondary | ICD-10-CM | POA: Diagnosis not present

## 2023-10-12 DIAGNOSIS — Z1211 Encounter for screening for malignant neoplasm of colon: Secondary | ICD-10-CM | POA: Diagnosis not present

## 2023-10-12 DIAGNOSIS — K2100 Gastro-esophageal reflux disease with esophagitis, without bleeding: Secondary | ICD-10-CM | POA: Insufficient documentation

## 2023-10-12 DIAGNOSIS — Z7951 Long term (current) use of inhaled steroids: Secondary | ICD-10-CM | POA: Diagnosis not present

## 2023-10-12 DIAGNOSIS — K635 Polyp of colon: Secondary | ICD-10-CM

## 2023-10-12 DIAGNOSIS — G8929 Other chronic pain: Secondary | ICD-10-CM | POA: Insufficient documentation

## 2023-10-12 DIAGNOSIS — K449 Diaphragmatic hernia without obstruction or gangrene: Secondary | ICD-10-CM | POA: Diagnosis not present

## 2023-10-12 DIAGNOSIS — I10 Essential (primary) hypertension: Secondary | ICD-10-CM | POA: Insufficient documentation

## 2023-10-12 DIAGNOSIS — M549 Dorsalgia, unspecified: Secondary | ICD-10-CM | POA: Diagnosis not present

## 2023-10-12 DIAGNOSIS — K2970 Gastritis, unspecified, without bleeding: Secondary | ICD-10-CM | POA: Diagnosis not present

## 2023-10-12 DIAGNOSIS — Z860100 Personal history of colon polyps, unspecified: Secondary | ICD-10-CM | POA: Diagnosis not present

## 2023-10-12 DIAGNOSIS — K648 Other hemorrhoids: Secondary | ICD-10-CM | POA: Insufficient documentation

## 2023-10-12 DIAGNOSIS — R109 Unspecified abdominal pain: Secondary | ICD-10-CM

## 2023-10-12 DIAGNOSIS — Z860101 Personal history of adenomatous and serrated colon polyps: Secondary | ICD-10-CM | POA: Insufficient documentation

## 2023-10-12 HISTORY — PX: POLYPECTOMY: SHX5525

## 2023-10-12 HISTORY — PX: BALLOON DILATION: SHX5330

## 2023-10-12 HISTORY — PX: ESOPHAGOGASTRODUODENOSCOPY (EGD) WITH PROPOFOL: SHX5813

## 2023-10-12 HISTORY — PX: COLONOSCOPY WITH PROPOFOL: SHX5780

## 2023-10-12 HISTORY — PX: BIOPSY: SHX5522

## 2023-10-12 SURGERY — COLONOSCOPY WITH PROPOFOL
Anesthesia: General

## 2023-10-12 MED ORDER — LIDOCAINE HCL (CARDIAC) PF 100 MG/5ML IV SOSY
PREFILLED_SYRINGE | INTRAVENOUS | Status: DC | PRN
  Administered 2023-10-12: 40 mg via INTRAVENOUS

## 2023-10-12 MED ORDER — LACTATED RINGERS IV SOLN: INTRAVENOUS | Status: DC | PRN

## 2023-10-12 MED ORDER — STERILE WATER FOR IRRIGATION IR SOLN
Status: DC | PRN
  Administered 2023-10-12: 60 mL

## 2023-10-12 MED ORDER — PROPOFOL 500 MG/50ML IV EMUL
INTRAVENOUS | Status: DC | PRN
  Administered 2023-10-12: 60 mg via INTRAVENOUS
  Administered 2023-10-12: 150 ug/kg/min via INTRAVENOUS

## 2023-10-12 NOTE — Transfer of Care (Signed)
Immediate Anesthesia Transfer of Care Note  Patient: Virginia Washington  Procedure(s) Performed: COLONOSCOPY WITH PROPOFOL ESOPHAGOGASTRODUODENOSCOPY (EGD) WITH PROPOFOL BALLOON DILATION BIOPSY POLYPECTOMY  Patient Location: Short Stay  Anesthesia Type:General  Level of Consciousness: awake  Airway & Oxygen Therapy: Patient Spontanous Breathing  Post-op Assessment: Report given to RN and Post -op Vital signs reviewed and stable  Post vital signs: Reviewed and stable  Last Vitals:  Vitals Value Taken Time  BP 110/72 10/12/23 1030  Temp 36.5 C 10/12/23 1030  Pulse 74 10/12/23 1030  Resp 14 10/12/23 1030  SpO2 98 % 10/12/23 1030    Last Pain:  Vitals:   10/12/23 1030  TempSrc: Oral  PainSc: 0-No pain         Complications: No notable events documented.

## 2023-10-12 NOTE — Anesthesia Postprocedure Evaluation (Signed)
Anesthesia Post Note  Patient: Virginia Washington  Procedure(s) Performed: COLONOSCOPY WITH PROPOFOL ESOPHAGOGASTRODUODENOSCOPY (EGD) WITH PROPOFOL BALLOON DILATION BIOPSY POLYPECTOMY  Patient location during evaluation: PACU Anesthesia Type: General Level of consciousness: awake and alert Pain management: pain level controlled Vital Signs Assessment: post-procedure vital signs reviewed and stable Respiratory status: spontaneous breathing, nonlabored ventilation, respiratory function stable and patient connected to nasal cannula oxygen Cardiovascular status: blood pressure returned to baseline and stable Postop Assessment: no apparent nausea or vomiting Anesthetic complications: no   There were no known notable events for this encounter.   Last Vitals:  Vitals:   10/12/23 0836 10/12/23 1030  BP: 111/63 110/72  Pulse: (!) 58 74  Resp: 14 14  Temp: (!) 36.3 C 36.5 C  SpO2: 100% 98%    Last Pain:  Vitals:   10/12/23 1030  TempSrc: Oral  PainSc: 0-No pain                 Shawndale Kilpatrick L Leia Coletti

## 2023-10-12 NOTE — Op Note (Signed)
[facility] Patient Name: Virginia Washington Procedure Date: 10/12/2023 9:47 AM MRN: [id number] Date of Birth: 01/28/1956 Attending MD: Hennie Duos. Marletta Lor , [place], [id number] CSN: [id number] Age: 67 Admit Type: Ambulatory Procedure:                Colonoscopy Indications:              Surveillance: Personal history of adenomatous                            polyps on last colonoscopy 5 years ago Providers:                Hennie Duos. Marletta Lor, DO, Emilee Tubb RN, RN, Carlean Purl RN, RN, Lennice Sites Technician,                            Technician Referring MD:              Medicines:                See the Anesthesia note for documentation of the                            administered medications Complications:            No immediate complications. Estimated Blood Loss:     Estimated blood loss was minimal. Procedure:                Pre-Anesthesia Assessment:                           - The anesthesia plan was to use monitored                            anesthesia care (MAC).                           After obtaining informed consent, the colonoscope                            was passed under direct vision. Throughout the                            procedure, the patient's blood pressure, pulse, and                            oxygen saturations were monitored continuously. The                            [id number]-HQ190L ([id number]) was introduced through the                            anus and advanced to the the cecum, identified by                            appendiceal orifice and ileocecal  valve. The                            colonoscopy was performed without difficulty. The                            patient tolerated the procedure well. The quality                            of the bowel preparation was evaluated using the                            BBPS [facility] Bowel Preparation Scale) with scores                            of: Right Colon = 3,  Transverse Colon = 3 and Left                            Colon = 3 (entire mucosa seen well with no residual                            staining, small fragments of stool or opaque                            liquid). The total BBPS score equals 9. Scope In: 10:06:30 AM Scope Out: 10:18:10 AM Scope Withdrawal Time: 0 hours 9 minutes 42 seconds  Total Procedure Duration: 0 hours 11 minutes 40 seconds  Findings:      Internal hemorrhoids were found during endoscopy.      A 5 mm polyp was found in the transverse colon. The polyp was sessile.       The polyp was removed with a cold snare. Resection and retrieval were       complete.      Two sessile polyps were found in the sigmoid colon. The polyps were 4 to       5 mm in size. These polyps were removed with a cold snare. Resection and       retrieval were complete.      The exam was otherwise without abnormality. Impression:               - Internal hemorrhoids.                           - One 5 mm polyp in the transverse colon, removed                            with a cold snare. Resected and retrieved.                           - Two 4 to 5 mm polyps in the sigmoid colon,                            removed with a cold snare. Resected and retrieved.                           -  The examination was otherwise normal. Moderate Sedation:      Per Anesthesia Care Recommendation:           - Patient has a contact number available for                            emergencies. The signs and symptoms of potential                            delayed complications were discussed with the                            patient. Return to normal activities tomorrow.                            Written discharge instructions were provided to the                            patient.                           - Resume previous diet.                           - Continue present medications.                           - Await pathology results.                            - Repeat colonoscopy in 5 years for surveillance.                           - Return to GI clinic in 3 months. Procedure Code(s):        --- Professional ---                           [phone], Colonoscopy, flexible; with removal of                            tumor(s), polyp(s), or other lesion(s) by snare                            technique Diagnosis Code(s):        --- Professional ---                           Z86.010, Personal history of colonic polyps                           D12.3, Benign neoplasm of transverse colon (hepatic                            flexure or splenic flexure)                           D12.5, Benign neoplasm of sigmoid  colon                           K64.8, Other hemorrhoids CPT copyright 2022 American Medical Association. All rights reserved. The codes documented in this report are preliminary and upon coder review may  be revised to meet current compliance requirements. Hennie Duos. Marletta Lor, DO Hennie Duos. Neo Yepiz, DO 10/12/2023 10:21:05 AM This report has been signed electronically. Number of Addenda: 0

## 2023-10-12 NOTE — Op Note (Addendum)
[facility] Patient Name: Virginia Washington Procedure Date: 10/12/2023 9:49 AM MRN: [id number] Date of Birth: 08-04-1956 Attending MD: Hennie Duos. Marletta Lor , [place], [id number] CSN: [id number] Age: 67 Admit Type: Outpatient Procedure:                Upper GI endoscopy Indications:              Epigastric abdominal pain, Dysphagia, Heartburn Providers:                Hennie Duos. Marletta Lor, DO, Emilee Tubb RN, RN, Carlean Purl RN, RN, Lennice Sites Technician,                            Technician Referring MD:              Medicines:                See the Anesthesia note for documentation of the                            administered medications Complications:            No immediate complications. Estimated Blood Loss:     Estimated blood loss was minimal. Procedure:                Pre-Anesthesia Assessment:                           - The anesthesia plan was to use monitored                            anesthesia care (MAC).                           After obtaining informed consent, the endoscope was                            passed under direct vision. Throughout the                            procedure, the patient's blood pressure, pulse, and                            oxygen saturations were monitored continuously. The                            [id number]-[id number] ([id number]) scope was introduced through the                            mouth, and advanced to the second part of duodenum.                            The upper GI endoscopy was accomplished without                            difficulty.  The patient tolerated the procedure                            well. Scope In: 9:55:12 AM Scope Out: 10:01:41 AM Total Procedure Duration: 0 hours 6 minutes 29 seconds  Findings:      A small hiatal hernia was present.      The Z-line was irregular. Biopsies were taken with a cold forceps for       histology.      LA Grade A (one or more mucosal breaks less than 5 mm,  not extending       between tops of 2 mucosal folds) esophagitis with no bleeding was found       at the gastroesophageal junction.      No endoscopic abnormality was evident in the esophagus to explain the       patient's complaint of dysphagia. Preparations were made for empiric       dilation. A TTS dilator was passed through the scope. Dilation with an       18-19-20 mm balloon dilator was performed to 20 mm. Dilation was       performed with a mild resistance at 20 mm. Estimated blood loss was none.      Patchy mild inflammation characterized by erythema was found in the       gastric body and in the gastric antrum. Biopsies were taken with a cold       forceps for Helicobacter pylori testing.      The duodenal bulb, first portion of the duodenum and second portion of       the duodenum were normal. Impression:               - Small hiatal hernia.                           - Z-line irregular. Biopsied.                           - LA Grade A reflux esophagitis with no bleeding.                           - Gastritis. Biopsied.                           - Normal duodenal bulb, first portion of the                            duodenum and second portion of the duodenum. Moderate Sedation:      Per Anesthesia Care Recommendation:           - Patient has a contact number available for                            emergencies. The signs and symptoms of potential                            delayed complications were discussed with the                            patient. Return to normal  activities tomorrow.                            Written discharge instructions were provided to the                            patient.                           - Resume previous diet.                           - Continue present medications.                           - Await pathology results.                           - Repeat upper endoscopy PRN for retreatment.                           - Return to GI  clinic in 3 months.                           - Use a proton pump inhibitor PO BID. Procedure Code(s):        --- Professional ---                           [phone], Esophagogastroduodenoscopy, flexible,                            transoral; with biopsy, single or multiple Diagnosis Code(s):        --- Professional ---                           K44.9, Diaphragmatic hernia without obstruction or                            gangrene                           K22.89, Other specified disease of esophagus                           K21.00, Gastro-esophageal reflux disease with                            esophagitis, without bleeding                           K29.70, Gastritis, unspecified, without bleeding                           R10.13, Epigastric pain                           R13.10, Dysphagia, unspecified  R12, Heartburn CPT copyright 2022 American Medical Association. All rights reserved. The codes documented in this report are preliminary and upon coder review may  be revised to meet current compliance requirements. Hennie Duos. Marletta Lor, DO Hennie Duos. Zymir Napoli, DO 10/12/2023 10:04:52 AM This report has been signed electronically. Number of Addenda: 0

## 2023-10-12 NOTE — Discharge Instructions (Signed)
EGD Discharge instructions Please read the instructions outlined below and refer to this sheet in the next few weeks. These discharge instructions provide you with general information on caring for yourself after you leave the hospital. Your doctor may also give you specific instructions. While your treatment has been planned according to the most current medical practices available, unavoidable complications occasionally occur. If you have any problems or questions after discharge, please call your doctor. ACTIVITY You may resume your regular activity but move at a slower pace for the next 24 hours.  Take frequent rest periods for the next 24 hours.  Walking will help expel (get rid of) the air and reduce the bloated feeling in your abdomen.  No driving for 24 hours (because of the anesthesia (medicine) used during the test).  You may shower.  Do not sign any important legal documents or operate any machinery for 24 hours (because of the anesthesia used during the test).  NUTRITION Drink plenty of fluids.  You may resume your normal diet.  Begin with a light meal and progress to your normal diet.  Avoid alcoholic beverages for 24 hours or as instructed by your caregiver.  MEDICATIONS You may resume your normal medications unless your caregiver tells you otherwise.  WHAT YOU CAN EXPECT TODAY You may experience abdominal discomfort such as a feeling of fullness or "gas" pains.  FOLLOW-UP Your doctor will discuss the results of your test with you.  SEEK IMMEDIATE MEDICAL ATTENTION IF ANY OF THE FOLLOWING OCCUR: Excessive nausea (feeling sick to your stomach) and/or vomiting.  Severe abdominal pain and distention (swelling).  Trouble swallowing.  Temperature over 101 F (37.8 C).  Rectal bleeding or vomiting of blood.      Colonoscopy Discharge Instructions  Read the instructions outlined below and refer to this sheet in the next few weeks. These discharge instructions provide you  with general information on caring for yourself after you leave the hospital. Your doctor may also give you specific instructions. While your treatment has been planned according to the most current medical practices available, unavoidable complications occasionally occur.   ACTIVITY You may resume your regular activity, but move at a slower pace for the next 24 hours.  Take frequent rest periods for the next 24 hours.  Walking will help get rid of the air and reduce the bloated feeling in your belly (abdomen).  No driving for 24 hours (because of the medicine (anesthesia) used during the test).   Do not sign any important legal documents or operate any machinery for 24 hours (because of the anesthesia used during the test).  NUTRITION Drink plenty of fluids.  You may resume your normal diet as instructed by your doctor.  Begin with a light meal and progress to your normal diet. Heavy or fried foods are harder to digest and may make you feel sick to your stomach (nauseated).  Avoid alcoholic beverages for 24 hours or as instructed.  MEDICATIONS You may resume your normal medications unless your doctor tells you otherwise.  WHAT YOU CAN EXPECT TODAY Some feelings of bloating in the abdomen.  Passage of more gas than usual.  Spotting of blood in your stool or on the toilet paper.  IF YOU HAD POLYPS REMOVED DURING THE COLONOSCOPY: No aspirin products for 7 days or as instructed.  No alcohol for 7 days or as instructed.  Eat a soft diet for the next 24 hours.  FINDING OUT THE RESULTS OF YOUR TEST Not all test results  are available during your visit. If your test results are not back during the visit, make an appointment with your caregiver to find out the results. Do not assume everything is normal if you have not heard from your caregiver or the medical facility. It is important for you to follow up on all of your test results.  SEEK IMMEDIATE MEDICAL ATTENTION IF: You have more than a  spotting of blood in your stool.  Your belly is swollen (abdominal distention).  You are nauseated or vomiting.  You have a temperature over 101.  You have abdominal pain or discomfort that is severe or gets worse throughout the day.   Your EGD revealed mild amount inflammation in your stomach.  I took biopsies of this to rule out infection with a bacteria called H. pylori.    Small hiatal hernia again noted.  I also took samples of your esophagus.  I did stretch your esophagus again today.  Hopefully this helps with food getting stuck.  Continue on pantoprazole twice daily  Your colonoscopy revealed 3 polyp(s) which I removed successfully. Await pathology results, my office will contact you. I recommend repeating colonoscopy in 5 years for surveillance purposes.    I hope you have a great rest of your week!  Hennie Duos. Marletta Lor, D.O. Gastroenterology and Hepatology [facility]

## 2023-10-12 NOTE — Anesthesia Preprocedure Evaluation (Addendum)
Anesthesia Evaluation  Patient identified by MRN, date of birth, ID band Patient awake    Reviewed: Allergy & Precautions, H&P , NPO status , Patient's Chart, lab work & pertinent test results, reviewed documented beta blocker date and time   Airway Mallampati: II  TM Distance: >3 FB Neck ROM: full    Dental  (+) Edentulous Lower, Edentulous Upper   Pulmonary COPD, Current Smoker and Patient abstained from smoking.   Pulmonary exam normal breath sounds clear to auscultation       Cardiovascular Exercise Tolerance: Good hypertension, + Peripheral Vascular Disease  Normal cardiovascular exam Rhythm:regular Rate:Normal     Neuro/Psych  Headaches  negative psych ROS   GI/Hepatic Neg liver ROS,GERD  Medicated,,  Endo/Other  negative endocrine ROS    Renal/GU negative Renal ROS  negative genitourinary   Musculoskeletal  (+) Arthritis , Osteoarthritis,    Abdominal   Peds  Hematology negative hematology ROS (+)   Anesthesia Other Findings   Reproductive/Obstetrics negative OB ROS                             Anesthesia Physical Anesthesia Plan  ASA: 3  Anesthesia Plan: General   Post-op Pain Management: Minimal or no pain anticipated   Induction:   PONV Risk Score and Plan: Propofol infusion  Airway Management Planned: Nasal Cannula and Natural Airway  Additional Equipment: None  Intra-op Plan:   Post-operative Plan:   Informed Consent: I have reviewed the patients History and Physical, chart, labs and discussed the procedure including the risks, benefits and alternatives for the proposed anesthesia with the patient or authorized representative who has indicated his/her understanding and acceptance.     Dental Advisory Given  Plan Discussed with: CRNA  Anesthesia Plan Comments:         Anesthesia Quick Evaluation

## 2023-10-12 NOTE — H&P (Signed)
Primary Care Physician:  Benetta Spar, MD Primary Gastroenterologist:  Dr. Marletta Lor  Pre-Procedure History & Physical: HPI:  Virginia Washington is a 67 y.o. female is here for an EGD with possible dilation due to history of GERD with esophagitis, dysphagia with esophageal stenosis and Schatzki ring, chronic abdominal pain and colonoscopy for surveillance purposes, personal history of adenomatous colon polyps 2019.  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  Normal right aortoiliac vessels.  Three-vessel runoff bilaterally.  (Sluggish flow of the left side due to occlusion.   ABDOMINAL HYSTERECTOMY     BALLOON DILATION N/A 10/05/2020   Procedure: BALLOON DILATION;  Surgeon: Lanelle Bal, DO;  [facility];  Service: Endoscopy;  Laterality: N/A;   BALLOON DILATION N/A 07/17/2022   Procedure: BALLOON DILATION;  Surgeon: Lanelle Bal, DO;  [facility];  Service: Endoscopy;  Laterality: N/A;   BIOPSY  10/05/2020   Procedure: BIOPSY;  Surgeon: Lanelle Bal, DO;  [facility];  Service: Endoscopy;;   BIOPSY  07/17/2022   Procedure: BIOPSY;  Surgeon: Lanelle Bal, DO;  [facility];  Service: Endoscopy;;   CHOLECYSTECTOMY     COLONOSCOPY  2007   Dr. Karilyn Cota: small internal hemorrhoids, otherwise normal. exam could be  compromised due to quality of prep   COLONOSCOPY N/A 02/15/2018   Dr. Darrick Penna: 2 simple adenomas, external and internal hemorrhoids. Next colonoscopy 2024-2026   ESOPHAGOGASTRODUODENOSCOPY N/A 05/09/2018   Procedure: ESOPHAGOGASTRODUODENOSCOPY (EGD);  Surgeon: West Bali, MD; web in the distal esophagus s/p dilated, mild gastritis biopsied, normal examined duodenum. Pathology with gastritis, negative for H. Pylori.     ESOPHAGOGASTRODUODENOSCOPY (EGD) WITH PROPOFOL N/A 10/05/2020   Surgeon: Lanelle Bal, DO; small hiatal hernia, grade B reflux esophagitis, benign-appearing esophageal stenosis dilated, gastritis biopsied- mild nonspecific reactive gastropathy, negative for H. pylori.   ESOPHAGOGASTRODUODENOSCOPY (EGD) WITH PROPOFOL N/A 07/17/2022   Procedure: ESOPHAGOGASTRODUODENOSCOPY (EGD) WITH PROPOFOL;  Surgeon: Lanelle Bal, DO;  [facility];  Service: Endoscopy;  Laterality: N/A;  9:45am   FEMORAL-FEMORAL BYPASS GRAFT Bilateral 10/21/2018   Procedure: RIGHT TO LEFT FEMORAL ARTERY BYPASS GRAFT;  Surgeon: Sherren Kerns, MD;  Location: [facility];  Service: Vascular;  Laterality: Bilateral;   LOWER EXTREMITY ANGIOGRAPHY N/A 09/27/2018   Procedure: LOWER EXTREMITY ANGIOGRAPHY;  Surgeon: Sherren Kerns, MD;  [facility];  Service: Cardiovascular;  Laterality: N/A;   NM MYOVIEW LTD  12/2014   [facility]: Normal Myoview.  Nonischemic.   TRANSTHORACIC ECHOCARDIOGRAM  12/2011   EF 55 to 60%.  No regional wall motion normality.  GRII DD.  No significant valve disease.   VASCULAR SURGERY      Prior to Admission medications   Medication Sig Start Date End Date Taking? Authorizing Provider  [employer]  hydroxide-simeth (MAALOX MAX) 400-400-40 MG/5ML suspension Take 15 mLs by mouth every 6 (six) hours as needed for indigestion. 06/23/22  Yes Kommor, Madison, MD  amLODipine (NORVASC) 5 MG tablet Take 5 mg by mouth daily.   Yes [provider]   gabapentin (NEURONTIN) 100 MG capsule  12/13/22  Yes [provider]  hydrochlorothiazide (HYDRODIURIL) 12.5 MG tablet Take 12.5 mg by mouth daily. 03/02/22  Yes [provider]  pantoprazole (PROTONIX) 40 MG tablet Take 1 tablet (40 mg total) by mouth 2 (two) times daily before a meal. 07/19/23  Yes Clearance Coots, Kristen S, PA-C  albuterol (VENTOLIN HFA) 108 (90 Base) MCG/ACT inhaler Inhale 2 puffs into the lungs every 6 (six) hours as needed for wheezing or shortness of breath. 09/25/23   Elpidio Anis, PA-C  atorvastatin (LIPITOR) 20 MG tablet Take 20 mg by mouth daily. Patient not taking: Reported on 07/19/2023 10/03/22   [provider]  ipratropium-albuterol (DUONEB) 0.5-2.5 (3) MG/3ML SOLN SMARTSIG:1 Vial(s) Via Nebulizer Every 4-6 Hours PRN 09/08/22   [provider]  linaclotide (LINZESS) 290 MCG CAPS capsule Take 1 capsule (290 mcg total) by mouth daily before breakfast. Patient not taking: Reported on 07/19/2023 04/20/22 04/20/23  Lanelle Bal, DO  simethicone (MYLICON) 125 MG chewable tablet Chew 1 tablet after each meal and at bedtime if needed Patient not taking: Reported on 07/19/2023 01/12/22   Lanelle Bal, DO  TRELEGY ELLIPTA 100-62.5-25 MCG/ACT AEPB  12/13/22   [provider]    Allergies as of 09/04/2023   (No Known Allergies)    Family History  Problem Relation Age of Onset   Hypertension Mother    Peripheral Artery Disease Brother    Colon cancer Neg Hx    Colon polyps Neg Hx     Social History   Socioeconomic History   Marital status: Widowed    Spouse name: Not on file   Number of children: Not on file   Years of education: Not on file   Highest education level: Not on file  Occupational History   Occupation: unemployed    Comment: Draws pension from her husband's life insurance policy  Tobacco Use   Smoking status: Every Day    Current packs/day: 0.50    Average packs/day: 0.5 packs/day for 40.0 years (20.0 ttl  pk-yrs)    Types: Cigarettes   Smokeless tobacco: Never  Vaping Use   Vaping status: Never Used  Substance and Sexual Activity   Alcohol use: No    Alcohol/week: 0.0 standard drinks of alcohol   Drug use: No   Sexual activity: Not Currently  Other Topics Concern   Not on file  Social History Narrative   Widowed: GETS HUSBAND'S PENSION.  mother 3, grandmother of 87 with one great grandchild.  She currently lives alone.  She completed ninth grade.  Smokes a pack a day for 42 years.  Tries to walks for about 5 minutes at x7 days a week with plans to help revascularization.       HOBBIES: PUZZLE BOOKS.   Social Determinants of Health   Financial Resource Strain: Not on file  Food Insecurity: Not on file  Transportation Needs: Not on file  Physical Activity: Not on file  Stress: Not on file  Social Connections: Not on file  Intimate Partner Violence: Not on file    Review of Systems: General: Negative for fever, chills, fatigue, weakness. Eyes: Negative for vision changes.  ENT: Negative for hoarseness, difficulty swallowing , nasal  congestion. CV: Negative for chest pain, angina, palpitations, dyspnea on exertion, peripheral edema.  Respiratory: Negative for dyspnea at rest, dyspnea on exertion, cough, sputum, wheezing.  GI: See history of present illness. GU:  Negative for dysuria, hematuria, urinary incontinence, urinary frequency, nocturnal urination.  MS: Negative for joint pain, low back pain.  Derm: Negative for rash or itching.  Neuro: Negative for weakness, abnormal sensation, seizure, frequent headaches, memory loss, confusion.  Psych: Negative for anxiety, depression Endo: Negative for unusual weight change.  Heme: Negative for bruising or bleeding. Allergy: Negative for rash or hives.  Physical Exam: Vital signs in last 24 hours: Temp:  [97.4 F (36.3 C)] 97.4 F (36.3 C) (10/25 0836) Pulse Rate:  [58] 58 (10/25 0836) Resp:  [14] 14 (10/25 0836) BP:  (111)/(63) 111/63 (10/25 0836) SpO2:  [100 %] 100 % (10/25 0836) Weight:  [61.2 kg] 61.2 kg (10/25 0834)   General:   Alert,  Well-developed, well-nourished, pleasant and cooperative in NAD Head:  Normocephalic and atraumatic. Eyes:  Sclera clear, no icterus.   Conjunctiva pink. Ears:  Normal auditory acuity. Nose:  No deformity, discharge,  or lesions. Msk:  Symmetrical without gross deformities. Normal posture. Extremities:  Without clubbing or edema. Neurologic:  Alert and  oriented x4;  grossly normal neurologically. Skin:  Intact without significant lesions or rashes. Psych:  Alert and cooperative. Normal mood and affect.   Impression/Plan: Virginia Washington is here for an EGD with possible dilation due to history of GERD with esophagitis, dysphagia with esophageal stenosis and Schatzki ring, chronic abdominal pain and colonoscopy for surveillance purposes, personal history of adenomatous colon polyps 2019.  Risks, benefits, limitations, imponderables and alternatives regarding procedure have been reviewed with the patient. Questions have been answered. All parties agreeable.

## 2023-10-15 ENCOUNTER — Ambulatory Visit: Payer: Medicare HMO | Admitting: Urology

## 2023-10-15 DIAGNOSIS — R32 Unspecified urinary incontinence: Secondary | ICD-10-CM

## 2023-10-16 LAB — SURGICAL PATHOLOGY

## 2023-10-18 ENCOUNTER — Encounter (HOSPITAL_COMMUNITY): Payer: Self-pay | Admitting: Internal Medicine

## 2023-11-02 NOTE — Progress Notes (Deleted)
Name: Virginia Washington DOB: 07/22/1956 MRN: [id number]  History of Present Illness: Virginia Washington is a 67 y.o. female who presents today as a new patient at [facility] [place]. All available relevant medical records have been reviewed.   She {Actions; denies-reports:120008} urge incontinence. She {Actions; denies-reports:120008} stress incontinence with ***cough/***laugh/***sneeze/***heavy lifting /***exercise. She reports the ***SUI / ***UUI is predominant.  She leaks *** times per ***. Wears *** ***pads/ ***diapers per day. She reports urinary incontinence {ACTION; IS/IS [id number]} significantly bothersome.   She reports urinary ***frequency, ***nocturia, ***urgency, and ***urge incontinence. Voiding ***x/day and ***x/night on average. Leaking ***x/day on average; using *** ***pads / ***diapers per day on average.  She {Actions; denies-reports:120008} prior attempted treatment for these symptoms ***including ***  She {Actions; denies-reports:120008} caffeine intake (*** caffeinated beverages per day on average).  She {Actions; denies-reports:120008} history of obstructive sleep apnea and {Actions; denies-reports:120008} wearing CPAP routinely. ***She denies ever having a sleep study before. She {Actions; denies-reports:120008} fluid intake within 3 hours prior to bedtime. She {Actions; denies-reports:120008} fluid intake during the night.  She {Actions; denies-reports:120008} caffeine intake within 8 hours prior to bedtime. She {Actions; denies-reports:120008} routinely experiencing lower extremity edema during the day. ***She {Actions; denies-reports:120008} elevating feet during the day and/or wearing compression socks when lower extremity edema is present during the day. ***She {Actions; denies-reports:120008} monitoring dietary salt and sodium intake.  She {Actions; denies-reports:120008} dysuria, gross hematuria, straining to void, or sensations of incomplete  emptying.   Fall Screening: Do you usually have a device to assist in your mobility? {yes/no:20286} ***cane / ***walker / ***wheelchair  Medications: Current Outpatient Medications  Medication Sig Dispense Refill   albuterol (VENTOLIN HFA) 108 (90 Base) MCG/ACT inhaler Inhale 2 puffs into the lungs every 6 (six) hours as needed for wheezing or shortness of breath. 1 each 0   alum & [employer] hydroxide-simeth (MAALOX MAX) 400-400-40 MG/5ML suspension Take 15 mLs by mouth every 6 (six) hours as needed for indigestion. 355 mL 0   amLODipine (NORVASC) 5 MG tablet Take 5 mg by mouth daily.     atorvastatin (LIPITOR) 20 MG tablet Take 20 mg by mouth daily. (Patient not taking: Reported on 07/19/2023)     gabapentin (NEURONTIN) 100 MG capsule      hydrochlorothiazide (HYDRODIURIL) 12.5 MG tablet Take 12.5 mg by mouth daily.     ipratropium-albuterol (DUONEB) 0.5-2.5 (3) MG/3ML SOLN SMARTSIG:1 Vial(s) Via Nebulizer Every 4-6 Hours PRN     linaclotide (LINZESS) 290 MCG CAPS capsule Take 1 capsule (290 mcg total) by mouth daily before breakfast. (Patient not taking: Reported on 07/19/2023) 90 capsule 3   pantoprazole (PROTONIX) 40 MG tablet Take 1 tablet (40 mg total) by mouth 2 (two) times daily before a meal. 60 tablet 3   simethicone (MYLICON) 125 MG chewable tablet Chew 1 tablet after each meal and at bedtime if needed (Patient not taking: Reported on 07/19/2023) 120 tablet 5   TRELEGY ELLIPTA 100-62.5-25 MCG/ACT AEPB      No current facility-administered medications for this visit.    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  Normal right aortoiliac vessels.  Three-vessel runoff bilaterally.  (Sluggish flow of the left side due to occlusion.   ABDOMINAL HYSTERECTOMY     BALLOON DILATION N/A 10/05/2020   Procedure: BALLOON DILATION;  Surgeon: Lanelle Bal, DO;  [facility];  Service: Endoscopy;  Laterality: N/A;   BALLOON DILATION N/A 07/17/2022   Procedure: BALLOON DILATION;  Surgeon: Lanelle Bal, DO;  [facility];  Service: Endoscopy;  Laterality: N/A;   BALLOON DILATION N/A 10/12/2023   Procedure: BALLOON DILATION;  Surgeon: Lanelle Bal, DO;  [facility];  Service: Endoscopy;  Laterality: N/A;   BIOPSY  10/05/2020   Procedure: BIOPSY;  Surgeon: Lanelle Bal, DO;  [facility];  Service: Endoscopy;;   BIOPSY  07/17/2022   Procedure: BIOPSY;  Surgeon: Lanelle Bal, DO;  [facility];  Service: Endoscopy;;   BIOPSY  10/12/2023   Procedure: BIOPSY;  Surgeon: Lanelle Bal, DO;  [facility];  Service: Endoscopy;;   CHOLECYSTECTOMY     COLONOSCOPY  2007   Dr. Karilyn Cota: small internal hemorrhoids, otherwise normal. exam could be compromised due to quality of prep   COLONOSCOPY N/A 02/15/2018   Dr. Darrick Penna: 2 simple adenomas, external and internal hemorrhoids. Next colonoscopy 2024-2026   COLONOSCOPY WITH PROPOFOL N/A 10/12/2023   Procedure: COLONOSCOPY WITH PROPOFOL;  Surgeon: Lanelle Bal, DO;  [facility];  Service: Endoscopy;  Laterality: N/A;  10:15 am, asa 3   ESOPHAGOGASTRODUODENOSCOPY N/A 05/09/2018   Procedure: ESOPHAGOGASTRODUODENOSCOPY (EGD);  Surgeon: West Bali, MD; web in the distal esophagus s/p dilated, mild gastritis  biopsied, normal examined duodenum. Pathology with gastritis, negative for H. Pylori.     ESOPHAGOGASTRODUODENOSCOPY (EGD) WITH PROPOFOL N/A 10/05/2020   Surgeon: Lanelle Bal, DO; small hiatal hernia, grade B reflux esophagitis, benign-appearing esophageal stenosis dilated, gastritis biopsied- mild nonspecific reactive gastropathy, negative for H. pylori.   ESOPHAGOGASTRODUODENOSCOPY (EGD) WITH PROPOFOL N/A 07/17/2022   Procedure: ESOPHAGOGASTRODUODENOSCOPY (EGD) WITH PROPOFOL;  Surgeon: Lanelle Bal, DO;  [facility];  Service: Endoscopy;  Laterality: N/A;  9:45am   ESOPHAGOGASTRODUODENOSCOPY (EGD) WITH PROPOFOL N/A 10/12/2023   Procedure: ESOPHAGOGASTRODUODENOSCOPY (EGD) WITH PROPOFOL;  Surgeon: Lanelle Bal, DO;  [facility];  Service: Endoscopy;  Laterality: N/A;   FEMORAL-FEMORAL BYPASS GRAFT Bilateral 10/21/2018   Procedure: RIGHT TO LEFT FEMORAL ARTERY BYPASS GRAFT;  Surgeon: Sherren Kerns, MD;  Location: [facility] OR;  Service: Vascular;  Laterality: Bilateral;   LOWER EXTREMITY ANGIOGRAPHY N/A 09/27/2018   Procedure: LOWER EXTREMITY ANGIOGRAPHY;  Surgeon: Sherren Kerns, MD;  [facility];  Service: Cardiovascular;  Laterality: N/A;   NM MYOVIEW LTD  12/2014   [facility]: Normal Myoview.  Nonischemic.   POLYPECTOMY  10/12/2023   Procedure: POLYPECTOMY;  Surgeon: Lanelle Bal, DO;  [facility];  Service: Endoscopy;;   TRANSTHORACIC ECHOCARDIOGRAM  12/2011   EF 55 to 60%.  No regional wall motion normality.  GRII DD.  No significant valve disease.   VASCULAR SURGERY     Family History  Problem Relation Age of Onset   Hypertension Mother    Peripheral  Artery Disease Brother    Colon cancer Neg Hx    Colon polyps Neg Hx    Social History   Socioeconomic History   Marital status: Widowed    Spouse name: Not on file   Number of children: Not on file   Years of education: Not on file   Highest  education level: Not on file  Occupational History   Occupation: unemployed    Comment: Draws pension from her husband's life insurance policy  Tobacco Use   Smoking status: Every Day    Current packs/day: 0.50    Average packs/day: 0.5 packs/day for 40.0 years (20.0 ttl pk-yrs)    Types: Cigarettes   Smokeless tobacco: Never  Vaping Use   Vaping status: Never Used  Substance and Sexual Activity   Alcohol use: No    Alcohol/week: 0.0 standard drinks of alcohol   Drug use: No   Sexual activity: Not Currently  Other Topics Concern   Not on file  Social History Narrative   Widowed: GETS HUSBAND'S PENSION.  mother 3, grandmother of 83 with one great grandchild.  She currently lives alone.  She completed ninth grade.  Smokes a pack a day for 42 years.  Tries to walks for about 5 minutes at x7 days a week with plans to help revascularization.       HOBBIES: PUZZLE BOOKS.   Social Determinants of Health   Financial Resource Strain: Not on file  Food Insecurity: Not on file  Transportation Needs: Not on file  Physical Activity: Not on file  Stress: Not on file  Social Connections: Not on file  Intimate Partner Violence: Not on file    SUBJECTIVE  Review of Systems*** Constitutional: Patient denies any unintentional weight loss or change in strength lntegumentary: Patient denies any rashes or pruritus Eyes: Patient denies ***dry eyes ENT: Patient ***denies dry mouth Cardiovascular: Patient denies chest pain or syncope Respiratory: Patient denies shortness of breath Gastrointestinal: Patient ***denies nausea, vomiting, constipation, or diarrhea Musculoskeletal: Patient denies muscle cramps or weakness Neurologic: Patient denies convulsions or seizures Allergic/Immunologic: Patient denies recent allergic reaction(s) Hematologic/Lymphatic: Patient denies bleeding tendencies Endocrine: Patient denies heat/cold intolerance  GU: As per HPI.  OBJECTIVE There were no vitals filed  for this visit. There is no height or weight on file to calculate BMI.  Physical Examination*** Constitutional: No obvious distress; patient is non-toxic appearing  Cardiovascular: No visible lower extremity edema.  Respiratory: The patient does not have audible wheezing/stridor; respirations do not appear labored  Gastrointestinal: Abdomen non-distended Musculoskeletal: Normal ROM of UEs  Skin: No obvious rashes/open sores  Neurologic: CN 2-12 grossly intact Psychiatric: Answered questions appropriately with normal affect  Hematologic/Lymphatic/Immunologic: No obvious bruises or sites of spontaneous bleeding  UA: ***negative *** WBC/hpf, *** RBC/hpf, bacteria (***) PVR: *** ml  ASSESSMENT No diagnosis found. ***  Will plan for follow up in *** months or sooner if needed. Pt verbalized understanding and agreement. All questions were answered.  PLAN Advised the following: *** ***No follow-ups on file.  No orders of the defined types were placed in this encounter.   It has been explained that the patient is to follow regularly with their PCP in addition to all other providers involved in their care and to follow instructions provided by these respective offices. Patient advised to contact urology clinic if any urologic-pertaining questions, concerns, new symptoms or problems arise in the interim period.  There are no Patient Instructions on file for this visit.  Electronically signed by:  Donnita Falls, MSN, FNP-C, CUNP 11/02/2023 2:47 PM

## 2023-11-03 DIAGNOSIS — J440 Chronic obstructive pulmonary disease with acute lower respiratory infection: Secondary | ICD-10-CM | POA: Diagnosis not present

## 2023-11-03 DIAGNOSIS — I10 Essential (primary) hypertension: Secondary | ICD-10-CM | POA: Diagnosis not present

## 2023-11-08 ENCOUNTER — Ambulatory Visit: Payer: Medicare HMO | Admitting: Urology

## 2023-11-08 DIAGNOSIS — N3941 Urge incontinence: Secondary | ICD-10-CM

## 2023-11-28 ENCOUNTER — Ambulatory Visit: Payer: Medicare HMO | Admitting: Urology

## 2023-11-28 ENCOUNTER — Encounter: Payer: Self-pay | Admitting: Urology

## 2023-11-28 VITALS — BP 109/70 | HR 75 | Temp 98.5°F

## 2023-11-28 DIAGNOSIS — N3946 Mixed incontinence: Secondary | ICD-10-CM | POA: Diagnosis not present

## 2023-11-28 DIAGNOSIS — R32 Unspecified urinary incontinence: Secondary | ICD-10-CM | POA: Diagnosis not present

## 2023-11-28 LAB — URINALYSIS, ROUTINE W REFLEX MICROSCOPIC
Bilirubin, UA: NEGATIVE
Glucose, UA: NEGATIVE
Ketones, UA: NEGATIVE
Leukocytes,UA: NEGATIVE
Nitrite, UA: NEGATIVE
Protein,UA: NEGATIVE
RBC, UA: NEGATIVE
Specific Gravity, UA: 1.015 (ref 1.005–1.030)
Urobilinogen, Ur: 1 mg/dL (ref 0.2–1.0)
pH, UA: 7.5 (ref 5.0–7.5)

## 2023-11-28 LAB — BLADDER SCAN AMB NON-IMAGING: Scan Result: 0

## 2023-11-28 NOTE — Patient Instructions (Signed)
Overactive bladder (OAB) overview for patients:  Symptoms may include: urinary urgency ("gotta go" feeling) urinary frequency (voiding >8 times per day) night time urination (nocturia) urge incontinence of urine (UUI)  While we do not know the exact etiology of OAB, several treatment options exist including:  Behavioral therapy: Reducing fluid intake Decreasing bladder stimulants (such as caffeine) and irritants (such as acidic food, spicy foods, alcohol) Urge suppression strategies Bladder retraining via timed voiding  Pelvic floor physical therapy  Medication(s) - can use one or both of the drug classes below. Anticholinergic / antimuscarinic medications:  Mechanism of action: Activate M3 receptors to reduce detrusor stimulation and increase bladder capacity  (parasympathetic nervous system). Effect: Relaxes the bladder to decrease overactivity, increase bladder storage capacity, and increase time between voids. Onset: Slow acting (may take 8-12 weeks to determine efficacy). Medications include: Vesicare (Solifenacin), Ditropan (Oxybutynin), Detrol (Tolterodine), Toviaz (Fesoterodine), Sanctura (Trospium), Urispas (Flavoxate), Enablex (Darifenacin), Bentyl (Dicyclomine), Levsin (Hyoscyamine ). Potential side effects include but are not limited to: Dry eyes, dry mouth, constipation, cognitive impairment, dementia risk with long term use, and urinary retention/ incomplete bladder emptying. Insurance companies generally prefer for patients to try 1-2 anticholinergic / antimuscarinic medications first due to low cost. Some exceptions are made based on patient-specific comorbidities / risk factors. Beta-3 agonist medications: Mechanism of action: Stimulates selective B3 adrenergic receptors to cause smooth muscle bladder relaxation (sympathetic nervous system). Effect: Relaxes the bladder to decrease overactivity, increase bladder storage capacity, and increase time  between voids. Onset: Slow acting (may take 8-12 weeks to determine efficacy). Medications include: Myrbetriq (Mirabegron) and Vibegron Leslye Peer). Potential side effects include but are not limited to: urinary retention / incomplete bladder emptying and elevated blood pressure (more likely to occur in individuals with pre-existing uncontrolled hypertension). These medications tend to be more expensive than the anticholinergic / antimuscarinic medications.   For patients with refractory OAB (if the above treatment options have been unsuccessful): Posterior tibial nerve stimulation (PTNS). Small acupuncture-type needle inserted near ankle with electric current to stimulate bladder via posterior tibial nerve pathway. Initially requires 12 weekly in-office treatments lasting 30 minutes each; followed by monthly in-office treatments lasting 30 minutes each for 1 year.  Bladder Botox injections. How it is done: Typically done via in-office cystoscopy; sometimes done in the OR depending on the situation. The bladder is numbed with lidocaine instilled via a catheter. Then the urologist injects Botox into the bladder muscle wall in about 20 locations. Causes local paralysis of the bladder muscle at the injection sites to reduce bladder muscle overactivity / spasms. The effect lasts for approximately 6 months and cannot be reversed once performed. Risks may included but are not limited to: infection, incomplete bladder emptying/ urinary retention, short term need for self-catheterization or indwelling catheter, and need for repeat therapy. There is a 5-12% chance of needing to catheterize with Botox - that usually resolves in a few months as the Botox wears off. Typically Botox injections would need to be repeated every 3-12 months since this is not a permanent therapy.  Sacral neuromodulation trial ([employer]tronic lnterStim or Axonics implant). Sacral neuromodulation is FDA-approved for uncontrolled urinary  urgency, urinary frequency, urinary urge incontinence, non-obstructive urinary retention, or fecal incontinence. It is not FDA-approved as a treatment for pain. The goal of this therapy is at least a 50% improvement in symptoms. It is NOT realistic to expect a 100% cure. This is a a 2-step outpatient procedure. After a successful  test period, a permanent wire and generator are placed in the OR. We discussed the risk of infection. We reviewed the fact that about 30% of patients fail the test phase and are not candidates for permanent generator placement. During the 1-2 week trial phase, symptoms are documented by the patient to determine response. If patient gets at least a 50% improvement in symptoms, they may then proceed with Step 2. Step 1: Trial lead placement. Per physician discretion, may done one of two ways: Percutaneous nerve evaluation (PNE) in the [facility] urology office. Performed by urologist under local anesthesia (numbing the area with lidocaine) using a spinal needle for placement of test wire, which usually stays in place for 5-7 days to determine therapy response. Test lead placement in OR under anesthesia. Usually stays in place 2 weeks to determine therapy response. > Step 2: Permanent implantation of sacral neuromodulation device, which is performed in the OR.  Sacral neuromodulation implants: All are conditionally MRI safe. Manufacturer: [employer]tr[employer] Website: [email or web address]/right-for-you.[email or web address] Options: lnterStim X: Non-rechargeable. The battery lasts 10 years on average. lnterStim Micro: Rechargeable. The battery lasts 15 years on average and must be charged routinely. Approximately 50% smaller implant than lnterStim X implant.  Manufacturer: Axonics Website: [employer]axonics[employer]com Options: Non-rechargeable (Axonics F15): The battery lasts 15 years on average. Rechargeable (Axonics R20):  The battery lasts 20 years on average and must be charged in office for about 1 hour every 6-10 months on average. Approximately 50% smaller implant than Axonics non-rechargeable implant.  Note: Generally the rechargeable devices are only advised for very small or thin patients who may not have sufficient adipose tissue to comfortably overlay the implanted device.  Suprapubic catheter (SP tube) placement. Only done in severely refractory OAB when all other options have failed or are not a viable treatment choice depending on patient factors. Involves placement of a catheter through the lower abdomen into the bladder to continuously drain the bladder into an external collection bag, which patient can then empty at their convenience every few hours. Done via an outpatient surgical procedure in the OR under anesthesia. Risks may included but are not limited to: surgical site pain, infections, skin irritation / breakdown, chronic bacteriuria, symptomatic UTls. The SP tube must stay in place continuously. This is a reversible procedure however - the insertion site will close if catheter is removed for more than a few hours. The SP tube must be exchanged routinely every 4 weeks to prevent the catheter from becoming clogged with sediment. SP tube exchanges are typically performed at a urology nurse visit or by a home health nurse.

## 2023-11-28 NOTE — Progress Notes (Signed)
Name: Virginia Washington DOB: 01/02/1956 MRN: [id number]  History of Present Illness: Ms. Virginia Washington is a 67 y.o. female who presents today as a new patient at [facility] [place]. All available relevant medical records have been reviewed.   Today: She reports chief complaint of urinary incontinence.  She reports urge incontinence. She reports stress incontinence with cough/laugh/sneeze. She reports the UUI is predominant.  She leaks multiple times per day with large volumes of urine. Wears 4 diapers per day, which she reports are fully saturated when she changes it. She reports urinary incontinence is significantly bothersome.   She reports urinary frequency, nocturia, and urgency. Voiding 5x/day and 3x/night on average.   She reports prior attempted treatment for these symptoms including Myrbetriq 50 mg which was prescribed by her PCP - she took that for one month but had no improvement with it so she was instructed to stop taking it.   She denies significant caffeine intake (drinks 1 coffee in the mornings; otherwise no caffeinated beverages for the rest of the day).  She denies history of obstructive sleep apnea; She denies ever having a sleep study before.  She denies fluid intake within 3 hours prior to bedtime. She denies fluid intake during the night.  She denies caffeine intake within 8 hours prior to bedtime. She denies routinely experiencing lower extremity edema during the day. She is not taking any diuretics.  She denies dysuria, gross hematuria, straining to void, or sensations of incomplete emptying.   Fall Screening: Do you usually have a device to assist in your mobility? No   Medications: Current Outpatient Medications  Medication Sig Dispense Refill   albuterol (VENTOLIN HFA) 108 (90 Base) MCG/ACT inhaler Inhale 2 puffs into the lungs every 6 (six) hours as needed for wheezing or shortness of breath. 1 each 0   alum & mag hydroxide-simeth (MAALOX MAX)  400-400-40 MG/5ML suspension Take 15 mLs by mouth every 6 (six) hours as needed for indigestion. 355 mL 0   amLODipine (NORVASC) 5 MG tablet Take 5 mg by mouth daily.     atorvastatin (LIPITOR) 20 MG tablet Take 20 mg by mouth daily.     hydrochlorothiazide (HYDRODIURIL) 12.5 MG tablet Take 12.5 mg by mouth daily.     ipratropium-albuterol (DUONEB) 0.5-2.5 (3) MG/3ML SOLN SMARTSIG:1 Vial(s) Via Nebulizer Every 4-6 Hours PRN     gabapentin (NEURONTIN) 100 MG capsule  (Patient not taking: Reported on 11/28/2023)     linaclotide (LINZESS) 290 MCG CAPS capsule Take 1 capsule (290 mcg total) by mouth daily before breakfast. (Patient not taking: Reported on 07/19/2023) 90 capsule 3   pantoprazole (PROTONIX) 40 MG tablet Take 1 tablet (40 mg total) by mouth 2 (two) times daily before a meal. (Patient not taking: Reported on 11/28/2023) 60 tablet 3   simethicone (MYLICON) 125 MG chewable tablet Chew 1 tablet after each meal and at bedtime if needed (Patient not taking: Reported on 11/28/2023) 120 tablet 5   TRELEGY ELLIPTA 100-62.5-25 MCG/ACT AEPB  (Patient not taking: Reported on 11/28/2023)     No current facility-administered medications for this visit.    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  Normal right aortoiliac vessels.  Three-vessel runoff  bilaterally.  (Sluggish flow of the left side due to occlusion.   ABDOMINAL HYSTERECTOMY     BALLOON DILATION N/A 10/05/2020   Procedure: BALLOON DILATION;  Surgeon: Lanelle Bal, DO;  Location: [facility];  Service: Endoscopy;  Laterality: N/A;   BALLOON DILATION N/A 07/17/2022   Procedure: BALLOON DILATION;  Surgeon: Lanelle Bal, DO;  [facility]: [facility];  Service: Endoscopy;  Laterality: N/A;   BALLOON DILATION N/A 10/12/2023   Procedure: BALLOON DILATION;  Surgeon: Lanelle Bal, DO;  [facility]: [facility];  Service: Endoscopy;  Laterality: N/A;   BIOPSY  10/05/2020   Procedure: BIOPSY;  Surgeon: Lanelle Bal, DO;  [facility]: [facility];  Service: Endoscopy;;   BIOPSY  07/17/2022   Procedure: BIOPSY;  Surgeon: Lanelle Bal, DO;  [facility];  Service: Endoscopy;;   BIOPSY  10/12/2023   Procedure: BIOPSY;  Surgeon: Lanelle Bal, DO;  [facility];  Service: Endoscopy;;   CHOLECYSTECTOMY     COLONOSCOPY  2007   Dr. Karilyn Cota: small internal hemorrhoids, otherwise normal. exam could be compromised due to quality of prep   COLONOSCOPY N/A 02/15/2018   Dr. Darrick Penna: 2 simple adenomas, external and internal hemorrhoids. Next colonoscopy 2024-2026   COLONOSCOPY WITH PROPOFOL N/A 10/12/2023   Procedure: COLONOSCOPY WITH PROPOFOL;  Surgeon: Lanelle Bal, DO;  [facility]: [facility];  Service: Endoscopy;  Laterality: N/A;  10:15 am, asa 3   ESOPHAGOGASTRODUODENOSCOPY N/A 05/09/2018   Procedure: ESOPHAGOGASTRODUODENOSCOPY (EGD);  Surgeon: West Bali, MD; web in the distal esophagus s/p dilated, mild gastritis biopsied, normal examined duodenum. Pathology with gastritis, negative for H. Pylori.     ESOPHAGOGASTRODUODENOSCOPY (EGD) WITH PROPOFOL N/A 10/05/2020   Surgeon: Lanelle Bal, DO; small hiatal hernia, grade B reflux esophagitis, benign-appearing esophageal stenosis dilated, gastritis biopsied- mild nonspecific reactive  gastropathy, negative for H. pylori.   ESOPHAGOGASTRODUODENOSCOPY (EGD) WITH PROPOFOL N/A 07/17/2022   Procedure: ESOPHAGOGASTRODUODENOSCOPY (EGD) WITH PROPOFOL;  Surgeon: Lanelle Bal, DO;  [facility];  Service: Endoscopy;  Laterality: N/A;  9:45am   ESOPHAGOGASTRODUODENOSCOPY (EGD) WITH PROPOFOL N/A 10/12/2023   Procedure: ESOPHAGOGASTRODUODENOSCOPY (EGD) WITH PROPOFOL;  Surgeon: Lanelle Bal, DO;  [facility];  Service: Endoscopy;  Laterality: N/A;   FEMORAL-FEMORAL BYPASS GRAFT Bilateral 10/21/2018   Procedure: RIGHT TO LEFT FEMORAL ARTERY BYPASS GRAFT;  Surgeon: Sherren Kerns, MD;  Location: [facility] OR;  Service: Vascular;  Laterality: Bilateral;   LOWER EXTREMITY ANGIOGRAPHY N/A 09/27/2018   Procedure: LOWER EXTREMITY ANGIOGRAPHY;  Surgeon: Sherren Kerns, MD;  Location: [facility];  Service: Cardiovascular;  Laterality: N/A;   NM MYOVIEW LTD  12/2014   [facility]: Normal Myoview.  Nonischemic.   POLYPECTOMY  10/12/2023   Procedure: POLYPECTOMY;  Surgeon: Lanelle Bal, DO;  [facility];  Service: Endoscopy;;   TRANSTHORACIC ECHOCARDIOGRAM  12/2011   EF 55 to 60%.  No regional wall motion normality.  GRII DD.  No significant valve disease.   VASCULAR SURGERY     Family History  Problem Relation Age  of Onset   Hypertension Mother    Peripheral Artery Disease Brother    Colon cancer Neg Hx    Colon polyps Neg Hx    Social History   Socioeconomic History   Marital status: Widowed    Spouse name: Not on file   Number of children: Not on file   Years of education: Not on file   Highest education level: Not on file  Occupational History   Occupation: unemployed    Comment: Draws pension from her husband's life insurance policy  Tobacco Use   Smoking status: Every Day    Current packs/day: 0.50    Average packs/day: 0.5 packs/day for 40.0 years (20.0 ttl pk-yrs)    Types: Cigarettes   Smokeless tobacco: Never   Vaping Use   Vaping status: Never Used  Substance and Sexual Activity   Alcohol use: No    Alcohol/week: 0.0 standard drinks of alcohol   Drug use: No   Sexual activity: Not Currently  Other Topics Concern   Not on file  Social History Narrative   Widowed: GETS HUSBAND'S PENSION.  mother 3, grandmother of 48 with one great grandchild.  She currently lives alone.  She completed ninth grade.  Smokes a pack a day for 42 years.  Tries to walks for about 5 minutes at x7 days a week with plans to help revascularization.       HOBBIES: PUZZLE BOOKS.   Social Determinants of Health   Financial Resource Strain: Not on file  Food Insecurity: Not on file  Transportation Needs: Not on file  Physical Activity: Not on file  Stress: Not on file  Social Connections: Not on file  Intimate Partner Violence: Not on file    SUBJECTIVE  Review of Systems Constitutional: Patient denies any unintentional weight loss or change in strength lntegumentary: Patient denies any rashes or pruritus Cardiovascular: Patient denies chest pain or syncope Respiratory: Patient denies shortness of breath Gastrointestinal: Patient denies nausea, vomiting, constipation, or diarrhea Musculoskeletal: Patient denies muscle cramps or weakness Neurologic: Patient denies convulsions or seizures Allergic/Immunologic: Patient denies recent allergic reaction(s) Hematologic/Lymphatic: Patient denies bleeding tendencies Endocrine: Patient denies heat/cold intolerance  GU: As per HPI.  OBJECTIVE Vitals:   11/28/23 0857  BP: 109/70  Pulse: 75  Temp: 98.5 F (36.9 C)   There is no height or weight on file to calculate BMI.  Physical Examination Constitutional: No obvious distress; patient is non-toxic appearing  Cardiovascular: No visible lower extremity edema.  Respiratory: The patient does not have audible wheezing/stridor; respirations do not appear labored  Gastrointestinal: Abdomen  non-distended Musculoskeletal: Normal ROM of UEs  Skin: No obvious rashes/open sores  Neurologic: CN 2-12 grossly intact Psychiatric: Answered questions appropriately with normal affect  Hematologic/Lymphatic/Immunologic: No obvious bruises or sites of spontaneous bleeding  UA: negative  PVR: 0 ml  ASSESSMENT Mixed stress and urge urinary incontinence - Plan: Urinalysis, Routine w reflex microscopic, BLADDER SCAN AMB NON-IMAGING  We discussed the different forms of urinary incontinence, such as stress and urge incontinence, and described how symptoms are consistent with mixed urinary incontinence.  1. For treatment of stress urinary incontinence: The etiology of this condition was explained in detail to include pelvic floor muscle relaxation and detachment of the urethra away from its connection to the pubic bone. Her risk profile was reviewed, including childbirth.  The management options were reviewed to include: No intervention, observation. Non-surgical options: Pelvic floor muscle rehabilitation Weight loss Incontinence pessary Surgical consultation   She elected  to proceed with expectant management.  2. For treatment of OAB with urinary frequency, nocturia, urgency, and urge incontinence: We discussed the symptoms of overactive bladder (OAB), which include urinary urgency, frequency, nocturia, with or without urge incontinence.   While we may not know the exact etiology of OAB, several risk factors can be identified.  - Her neurogenic risk factors for OAB-type symptoms including nicotine use>   We discussed the following management options in detail including potential benefits, risks, and side effects: Behavioral therapy: Modify fluid intake Decreasing bladder irritants (such as caffeine) Urge suppression strategies Bladder retraining / timed voiding Double voiding Medication(s): - For anticholinergic medications, we discussed the potential side effects of  anticholinergics including dry eyes, dry mouth, constipation, cognitive impairment and urinary retention.  - For beta-3 agonist medication, we discussed the risk for urinary retention and the potential side effect of elevated blood pressure specific to Myrbetriq (which is more likely to occur in individuals with uncontrolled hypertension).  For refractory cases: PTNS (posterior tibial nerve stimulation) Sacral neuromodulation trial (Medtronic lnterStim or Axonics implant) Bladder Botox injections  She decided to review information handouts provided today and to call provider / schedule follow up on an as-needed basis if she decides to move forward with any treatment in the future.   Pt verbalized understanding and agreement. All questions were answered.  PLAN Advised the following: 1. Return if symptoms worsen or fail to improve.  Orders Placed This Encounter  Procedures   Urinalysis, Routine w reflex microscopic   BLADDER SCAN AMB NON-IMAGING    It has been explained that the patient is to follow regularly with their PCP in addition to all other providers involved in their care and to follow instructions provided by these respective offices. Patient advised to contact urology clinic if any urologic-pertaining questions, concerns, new symptoms or problems arise in the interim period.  Patient Instructions             Overactive bladder (OAB) overview for patients:  Symptoms may include: urinary urgency ("gotta go" feeling) urinary frequency (voiding >8 times per day) night time urination (nocturia) urge incontinence of urine (UUI)  While we do not know the exact etiology of OAB, several treatment options exist including:  Behavioral therapy: Reducing fluid intake Decreasing bladder stimulants (such as caffeine) and irritants (such as acidic food, spicy foods, alcohol) Urge suppression strategies Bladder retraining via timed voiding  Pelvic floor physical  therapy  Medication(s) - can use one or both of the drug classes below. Anticholinergic / antimuscarinic medications:  Mechanism of action: Activate M3 receptors to reduce detrusor stimulation and increase bladder capacity   (parasympathetic nervous system). Effect: Relaxes the bladder to decrease overactivity, increase bladder storage capacity, and increase time between voids. Onset: Slow acting (may take 8-12 weeks to determine efficacy). Medications include: Vesicare (Solifenacin), Ditropan (Oxybutynin), Detrol (Tolterodine), Toviaz (Fesoterodine), Sanctura (Trospium), Urispas (Flavoxate), Enablex (Darifenacin), Bentyl (Dicyclomine), Levsin (Hyoscyamine ). Potential side effects include but are not limited to: Dry eyes, dry mouth, constipation, cognitive impairment, dementia risk with long term use, and urinary retention/ incomplete bladder emptying. Insurance companies generally prefer for patients to try 1-2 anticholinergic / antimuscarinic medications first due to low cost. Some exceptions are made based on patient-specific comorbidities / risk factors. Beta-3 agonist medications: Mechanism of action: Stimulates selective B3 adrenergic receptors to cause smooth muscle bladder relaxation (sympathetic nervous system). Effect: Relaxes the bladder to decrease overactivity, increase bladder storage capacity, and increase time between voids. Onset: Slow acting (may take  8-12 weeks to determine efficacy). Medications include: Myrbetriq (Mirabegron) and Vibegron Leslye Peer). Potential side effects include but are not limited to: urinary retention / incomplete bladder emptying and elevated blood pressure (more likely to occur in individuals with pre-existing uncontrolled hypertension). These medications tend to be more expensive than the anticholinergic / antimuscarinic medications.   For patients with refractory OAB (if the above treatment options have been unsuccessful): Posterior tibial nerve  stimulation (PTNS). Small acupuncture-type needle inserted near ankle with electric current to stimulate bladder via posterior tibial nerve pathway. Initially requires 12 weekly in-office treatments lasting 30 minutes each; followed by monthly in-office treatments lasting 30 minutes each for 1 year.  Bladder Botox injections. How it is done: Typically done via in-office cystoscopy; sometimes done in the OR depending on the situation. The bladder is numbed with lidocaine instilled via a catheter. Then the urologist injects Botox into the bladder muscle wall in about 20 locations. Causes local paralysis of the bladder muscle at the injection sites to reduce bladder muscle overactivity / spasms. The effect lasts for approximately 6 months and cannot be reversed once performed. Risks may included but are not limited to: infection, incomplete bladder emptying/ urinary retention, short term need for self-catheterization or indwelling catheter, and need for repeat therapy. There is a 5-12% chance of needing to catheterize with Botox - that usually resolves in a few months as the Botox wears off. Typically Botox injections would need to be repeated every 3-12 months since this is not a permanent therapy.  Sacral neuromodulation trial (Medtronic lnterStim or Axonics implant). Sacral neuromodulation is FDA-approved for uncontrolled urinary urgency, urinary frequency, urinary urge incontinence, non-obstructive urinary retention, or fecal incontinence. It is not FDA-approved as a treatment for pain. The goal of this therapy is at least a 50% improvement in symptoms. It is NOT realistic to expect a 100% cure. This is a a 2-step outpatient procedure. After a successful test period, a permanent wire and generator are placed in the OR. We discussed the risk of infection. We reviewed the fact that about 30% of patients fail the test phase and are not candidates for permanent generator placement. During the 1-2 week trial  phase, symptoms are documented by the patient to determine response. If patient gets at least a 50% improvement in symptoms, they may then proceed with Step 2. Step 1: Trial lead placement. Per physician discretion, may done one of two ways: Percutaneous nerve evaluation (PNE) in the [facility] urology office. Performed by urologist under local anesthesia (numbing the area with lidocaine) using a spinal needle for placement of test wire, which usually stays in place for 5-7 days to determine therapy response. Test lead placement in OR under anesthesia. Usually stays in place 2 weeks to determine therapy response. > Step 2: Permanent implantation of sacral neuromodulation device, which is performed in the OR.  Sacral neuromodulation implants: All are conditionally MRI safe. Manufacturer: Medtronic Website: [employer] therapy/right-for-you.html Options: lnterStim X: Non-rechargeable. The battery lasts 10 years on average. lnterStim Micro: Rechargeable. The battery lasts 15 years on average and must be charged routinely. Approximately 50% smaller implant than lnterStim X implant.  Manufacturer: Axonics Website: Findrealrelief.axonics.com Options: Non-rechargeable (Axonics F15): The battery lasts 15 years on average. Rechargeable (Axonics R20): The battery lasts 20 years on average and must be charged in office for about 1 hour every 6-10 months on average. Approximately 50% smaller implant than Axonics non-rechargeable implant.  Note: Generally the rechargeable devices are only advised for very small or thin patients  who may not have sufficient adipose tissue to comfortably overlay the implanted device.  Suprapubic catheter (SP tube) placement. Only done in severely refractory OAB when all other options have failed or are not a viable treatment choice depending on patient factors. Involves placement of a catheter  through the lower abdomen into the bladder to continuously drain the bladder into an external collection bag, which patient can then empty at their convenience every few hours. Done via an outpatient surgical procedure in the OR under anesthesia. Risks may included but are not limited to: surgical site pain, infections, skin irritation / breakdown, chronic bacteriuria, symptomatic UTls. The SP tube must stay in place continuously. This is a reversible procedure however - the insertion site will close if catheter is removed for more than a few hours. The SP tube must be exchanged routinely every 4 weeks to prevent the catheter from becoming clogged with sediment. SP tube exchanges are typically performed at a urology nurse visit or by a home health nurse.      Electronically signed by:  Donnita Falls, MSN, FNP-C, CUNP 11/28/2023 9:51 AM

## 2023-11-29 ENCOUNTER — Encounter: Payer: Self-pay | Admitting: Gastroenterology

## 2023-11-30 ENCOUNTER — Encounter (HOSPITAL_COMMUNITY): Payer: Self-pay

## 2023-11-30 ENCOUNTER — Emergency Department (HOSPITAL_COMMUNITY): Payer: Medicare HMO

## 2023-11-30 ENCOUNTER — Emergency Department (HOSPITAL_COMMUNITY)
Admission: EM | Admit: 2023-11-30 | Discharge: 2023-11-30 | Disposition: A | Discharge status: Home / Self Care | Payer: Medicare HMO | Attending: Emergency Medicine | Admitting: Emergency Medicine

## 2023-11-30 ENCOUNTER — Other Ambulatory Visit: Payer: Self-pay

## 2023-11-30 DIAGNOSIS — R112 Nausea with vomiting, unspecified: Secondary | ICD-10-CM

## 2023-11-30 DIAGNOSIS — R1084 Generalized abdominal pain: Secondary | ICD-10-CM | POA: Insufficient documentation

## 2023-11-30 DIAGNOSIS — R1030 Lower abdominal pain, unspecified: Secondary | ICD-10-CM | POA: Diagnosis not present

## 2023-11-30 DIAGNOSIS — Z9049 Acquired absence of other specified parts of digestive tract: Secondary | ICD-10-CM | POA: Diagnosis not present

## 2023-11-30 LAB — CBC WITH DIFFERENTIAL/PLATELET
Abs Immature Granulocytes: 0.03 10*3/uL (ref 0.00–0.07)
Basophils Absolute: 0 10*3/uL (ref 0.0–0.1)
Basophils Relative: 0 %
Eosinophils Absolute: 0.1 10*3/uL (ref 0.0–0.5)
Eosinophils Relative: 1 %
HCT: 45.3 % (ref 36.0–46.0)
Hemoglobin: 14.2 g/dL (ref 12.0–15.0)
Immature Granulocytes: 0 %
Lymphocytes Relative: 5 %
Lymphs Abs: 0.5 10*3/uL — ABNORMAL LOW (ref 0.7–4.0)
MCH: 27.4 pg (ref 26.0–34.0)
MCHC: 31.3 g/dL (ref 30.0–36.0)
MCV: 87.5 fL (ref 80.0–100.0)
Monocytes Absolute: 0.4 10*3/uL (ref 0.1–1.0)
Monocytes Relative: 4 %
Neutro Abs: 9.6 10*3/uL — ABNORMAL HIGH (ref 1.7–7.7)
Neutrophils Relative %: 90 %
Platelets: 264 10*3/uL (ref 150–400)
RBC: 5.18 MIL/uL — ABNORMAL HIGH (ref 3.87–5.11)
RDW: 14.4 % (ref 11.5–15.5)
WBC: 10.6 10*3/uL — ABNORMAL HIGH (ref 4.0–10.5)
nRBC: 0 % (ref 0.0–0.2)

## 2023-11-30 LAB — COMPREHENSIVE METABOLIC PANEL
ALT: 16 U/L (ref 0–44)
AST: 17 U/L (ref 15–41)
Albumin: 3.9 g/dL (ref 3.5–5.0)
Alkaline Phosphatase: 105 U/L (ref 38–126)
Anion gap: 12 (ref 5–15)
BUN: 16 mg/dL (ref 8–23)
CO2: 21 mmol/L — ABNORMAL LOW (ref 22–32)
Calcium: 9.1 mg/dL (ref 8.9–10.3)
Chloride: 107 mmol/L (ref 98–111)
Creatinine, Ser: 1.08 mg/dL — ABNORMAL HIGH (ref 0.44–1.00)
GFR, Estimated: 57 mL/min — ABNORMAL LOW (ref >60)
Glucose, Bld: 138 mg/dL — ABNORMAL HIGH (ref 70–99)
Potassium: 4.1 mmol/L (ref 3.5–5.1)
Sodium: 140 mmol/L (ref 135–145)
Total Bilirubin: 0.6 mg/dL (ref <1.2)
Total Protein: 7.3 g/dL (ref 6.5–8.1)

## 2023-11-30 LAB — URINALYSIS, ROUTINE W REFLEX MICROSCOPIC
Bilirubin Urine: NEGATIVE
Glucose, UA: NEGATIVE mg/dL
Hgb urine dipstick: NEGATIVE
Ketones, ur: NEGATIVE mg/dL
Leukocytes,Ua: NEGATIVE
Nitrite: NEGATIVE
Protein, ur: NEGATIVE mg/dL
Specific Gravity, Urine: >1.046 — ABNORMAL HIGH (ref 1.005–1.030)
pH: 7 (ref 5.0–8.0)

## 2023-11-30 LAB — LIPASE, BLOOD: Lipase: 38 U/L (ref 11–51)

## 2023-11-30 MED ORDER — ONDANSETRON HCL 4 MG PO TABS: 4.0000 mg | ORAL_TABLET | Freq: Three times a day (TID) | ORAL | 0 refills | Status: AC | PRN

## 2023-11-30 MED ORDER — ALUM & MAG HYDROXIDE-SIMETH 200-200-20 MG/5ML PO SUSP
30.0000 mL | Freq: Once | ORAL | Status: AC
  Administered 2023-11-30: 30 mL via ORAL
  Filled 2023-11-30: qty 30

## 2023-11-30 MED ORDER — IOHEXOL 300 MG/ML  SOLN
100.0000 mL | Freq: Once | INTRAMUSCULAR | Status: AC | PRN
Start: 2023-11-30 — End: 2023-11-30
  Administered 2023-11-30: 100 mL via INTRAVENOUS

## 2023-11-30 MED ORDER — LIDOCAINE VISCOUS HCL 2 % MT SOLN
15.0000 mL | Freq: Once | OROMUCOSAL | Status: AC
  Administered 2023-11-30: 15 mL via ORAL
  Filled 2023-11-30: qty 15

## 2023-11-30 MED ORDER — ONDANSETRON HCL 4 MG/2ML IJ SOLN
4.0000 mg | Freq: Once | INTRAMUSCULAR | Status: AC
  Administered 2023-11-30: 4 mg via INTRAVENOUS
  Filled 2023-11-30: qty 2

## 2023-11-30 MED ORDER — PANTOPRAZOLE SODIUM 40 MG IV SOLR
40.0000 mg | Freq: Once | INTRAVENOUS | Status: AC
  Administered 2023-11-30: 40 mg via INTRAVENOUS
  Filled 2023-11-30: qty 10

## 2023-11-30 MED ORDER — FENTANYL CITRATE PF 50 MCG/ML IJ SOSY
50.0000 ug | PREFILLED_SYRINGE | Freq: Once | INTRAMUSCULAR | Status: AC
  Administered 2023-11-30: 50 ug via INTRAVENOUS
  Filled 2023-11-30: qty 1

## 2023-11-30 NOTE — ED Notes (Signed)
Patient provided water and graham crackers for food challenge

## 2023-11-30 NOTE — ED Triage Notes (Addendum)
Patient from home for lower abdominal pain and vomiting that started at 2000 last night. Patient denies any constipation, diarrhea, or urinary changes. Reports family recently had a stomach virus and believes she has it. Upon arrival to ER, patient is alert and oriented, ambu; appears uncomfortable

## 2023-11-30 NOTE — ED Notes (Signed)
Patient transported to CT 

## 2023-11-30 NOTE — ED Provider Notes (Signed)
[place] EMERGENCY DEPARTMENT AT [facility] Provider Note   CSN: [id number] Arrival date & time: 11/30/23  6789     History  Chief Complaint  Patient presents with   Abdominal Pain   Emesis    Virginia Washington is a 67 y.o. female.  67 year old female that presents the ER today secondary to emesis and abdominal pain.  States that hurts in epigastric area.  Started few hours prior to arrival.  Multiple sick contacts at home with similar symptoms that sounds like they would likely be viral etiology. Feels a bit bloated in upper abdomen.    Abdominal Pain Associated symptoms: vomiting   Emesis Associated symptoms: abdominal pain        Home Medications Prior to Admission medications   Medication Sig Start Date End Date Taking? Authorizing Provider  ondansetron (ZOFRAN) 4 MG tablet Take 1 tablet (4 mg total) by mouth every 8 (eight) hours as needed for nausea or vomiting. 11/30/23  Yes Jurnie Garritano, Barbara Cower, MD  ondansetron (ZOFRAN) 4 MG tablet Take 1 tablet (4 mg total) by mouth every 8 (eight) hours as needed for nausea or vomiting. 11/30/23  Yes Anetha Slagel, Barbara Cower, MD  albuterol (VENTOLIN HFA) 108 (90 Base) MCG/ACT inhaler Inhale 2 puffs into the lungs every 6 (six) hours as needed for wheezing or shortness of breath. 09/25/23   Elpidio Anis, PA-C  alum & mag hydroxide-simeth (MAALOX MAX) 400-400-40 MG/5ML suspension Take 15 mLs by mouth every 6 (six) hours as needed for indigestion. 06/23/22   Kommor, Madison, MD  amLODipine (NORVASC) 5 MG tablet Take 5 mg by mouth daily.    [provider]  atorvastatin (LIPITOR) 20 MG tablet Take 20 mg by mouth daily. 10/03/22   [provider]  gabapentin (NEURONTIN) 100 MG capsule  12/13/22   [provider]  hydrochlorothiazide (HYDRODIURIL) 12.5 MG tablet Take 12.5 mg by mouth daily. 03/02/22   [provider]  ipratropium-albuterol (DUONEB) 0.5-2.5 (3) MG/3ML SOLN SMARTSIG:1 Vial(s) Via Nebulizer Every  4-6 Hours PRN 09/08/22   [provider]  linaclotide (LINZESS) 290 MCG CAPS capsule Take 1 capsule (290 mcg total) by mouth daily before breakfast. Patient not taking: Reported on 07/19/2023 04/20/22 04/20/23  Lanelle Bal, DO  pantoprazole (PROTONIX) 40 MG tablet Take 1 tablet (40 mg total) by mouth 2 (two) times daily before a meal. Patient not taking: Reported on 11/28/2023 07/19/23   Letta Median, PA-C  simethicone (MYLICON) 125 MG chewable tablet Chew 1 tablet after each meal and at bedtime if needed Patient not taking: Reported on 11/28/2023 01/12/22   Lanelle Bal, DO  TRELEGY ELLIPTA 100-62.5-25 MCG/ACT AEPB  12/13/22   [provider]      Allergies    Patient has no known allergies.    Review of Systems   Review of Systems  Gastrointestinal:  Positive for abdominal pain and vomiting.    Physical Exam Updated Vital Signs BP 110/69 (BP Location: Left Arm)   Pulse 92   Temp 100.1 F (37.8 C) (Oral)   Resp 19   Ht 5\' 2"  (1.575 m)   Wt 61.2 kg   SpO2 91%   BMI 24.69 kg/m  Physical Exam Vitals and nursing note reviewed.  Constitutional:      Appearance: She is well-developed.  HENT:     Head: Normocephalic and atraumatic.  Cardiovascular:     Rate and Rhythm: Normal rate and regular rhythm.  Pulmonary:     Effort: No respiratory  distress.     Breath sounds: No stridor.  Abdominal:     General: There is no distension.     Tenderness: There is generalized abdominal tenderness.  Musculoskeletal:     Cervical back: Normal range of motion.  Skin:    General: Skin is warm and dry.  Neurological:     General: No focal deficit present.     Mental Status: She is alert.     ED Results / Procedures / Treatments   Labs (all labs ordered are listed, but only abnormal results are displayed) Labs Reviewed  CBC WITH DIFFERENTIAL/PLATELET - Abnormal; Notable for the following components:      Result Value   WBC 10.6 (*)    RBC 5.18 (*)     Neutro Abs 9.6 (*)    Lymphs Abs 0.5 (*)    All other components within normal limits  COMPREHENSIVE METABOLIC PANEL - Abnormal; Notable for the following components:   CO2 21 (*)    Glucose, Bld 138 (*)    Creatinine, Ser 1.08 (*)    GFR, Estimated 57 (*)    All other components within normal limits  URINALYSIS, ROUTINE W REFLEX MICROSCOPIC - Abnormal; Notable for the following components:   Specific Gravity, Urine >1.046 (*)    All other components within normal limits  LIPASE, BLOOD    EKG None  Radiology CT ABDOMEN PELVIS W CONTRAST Result Date: 11/30/2023 CLINICAL DATA:  Lower abdominal pain, vomiting EXAM: CT ABDOMEN AND PELVIS WITH CONTRAST TECHNIQUE: Multidetector CT imaging of the abdomen and pelvis was performed using the standard protocol following bolus administration of intravenous contrast. RADIATION DOSE REDUCTION: This exam was performed according to the departmental dose-optimization program which includes automated exposure control, adjustment of the mA and/or kV according to patient size and/or use of iterative reconstruction technique. CONTRAST:  OMNIPAQUE IOHEXOL 300 MG/ML  SOLN COMPARISON:  11/10/2022 FINDINGS: Lower chest: Dependent bibasilar atelectasis. Hepatobiliary: No focal liver abnormality is seen. Status post cholecystectomy. No biliary dilatation. Pancreas: No focal abnormality or ductal dilatation. Spleen: No focal abnormality.  Normal size. Adrenals/Urinary Tract: No adrenal abnormality. No focal renal abnormality. No stones or hydronephrosis. Urinary bladder is unremarkable. Stomach/Bowel: Stomach, large and small bowel grossly unremarkable. 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 No evidence of aneurysm or adenopathy. Reproductive: Prior hysterectomy.  No adnexal masses. Other: No free fluid or free air. Musculoskeletal: No acute bony abnormality. IMPRESSION: No  acute findings in the abdomen or pelvis. Electronically Signed   By: Charlett Nose M.D.   On: 11/30/2023 03:44    Procedures Procedures    Medications Ordered in ED Medications  ondansetron (ZOFRAN) injection 4 mg (4 mg Intravenous Given 11/30/23 0247)  fentaNYL (SUBLIMAZE) injection 50 mcg (50 mcg Intravenous Given 11/30/23 0312)  iohexol (OMNIPAQUE) 300 MG/ML solution 100 mL (100 mLs Intravenous Contrast Given 11/30/23 0331)  pantoprazole (PROTONIX) injection 40 mg (40 mg Intravenous Given 11/30/23 0413)  alum & mag hydroxide-simeth (MAALOX/MYLANTA) 200-200-20 MG/5ML suspension 30 mL (30 mLs Oral Given 11/30/23 0416)    And  lidocaine (XYLOCAINE) 2 % viscous mouth solution 15 mL (15 mLs Oral Given 11/30/23 0416)    ED Course/ Medical Decision Making/ A&P                                 Medical Decision Making Amount and/or Complexity of Data Reviewed  Labs: ordered. Radiology: ordered.  Risk OTC drugs. Prescription drug management.   Feeling better, tolerating p.o., urine, CT and rest of labs are reassuring no evidence of bowel obstruction, appendicitis, colitis, enteritis or other acute etiology.  She is feeling better, sleeping comfortably and tolerating p.o. patient is stable for discharge.  Discussed with her expectant management reasons to return to the ER versus PCP versus she take medications at home.  Final Clinical Impression(s) / ED Diagnoses Final diagnoses:  Nausea and vomiting, unspecified vomiting type    Rx / DC Orders ED Discharge Orders          Ordered    ondansetron (ZOFRAN) 4 MG tablet  Every 8 hours PRN        11/30/23 0602    ondansetron (ZOFRAN) 4 MG tablet  Every 8 hours PRN        11/30/23 0603              Chardai Gangemi, Barbara Cower, MD 11/30/23 [phone]

## 2023-12-03 ENCOUNTER — Telehealth: Payer: Self-pay | Admitting: Gastroenterology

## 2023-12-03 DIAGNOSIS — I10 Essential (primary) hypertension: Secondary | ICD-10-CM | POA: Diagnosis not present

## 2023-12-03 DIAGNOSIS — J449 Chronic obstructive pulmonary disease, unspecified: Secondary | ICD-10-CM | POA: Diagnosis not present

## 2023-12-03 MED FILL — Ondansetron HCl Tab 4 MG: ORAL | Qty: 4 | Status: AC

## 2023-12-03 NOTE — Telephone Encounter (Signed)
Patient stopped by here and asked if you could call in the night time stool softener for her.  She said she had checked with the drugstore but they told her they didn't have anything for her.  She states "it feels like it gets stuck in the middle and wants to come back up".

## 2023-12-03 NOTE — Telephone Encounter (Signed)
Pt is needing refills on Linzess sent to [facility] on [address]. Pt was last seen on 07/19/2023.

## 2023-12-04 ENCOUNTER — Other Ambulatory Visit: Payer: Self-pay | Admitting: Gastroenterology

## 2023-12-04 DIAGNOSIS — K581 Irritable bowel syndrome with constipation: Secondary | ICD-10-CM

## 2023-12-04 MED ORDER — LINACLOTIDE 290 MCG PO CAPS: 290.0000 ug | ORAL_CAPSULE | Freq: Every day | ORAL | 3 refills | Status: DC

## 2023-12-04 NOTE — Telephone Encounter (Signed)
Rx sent 

## 2023-12-04 NOTE — Telephone Encounter (Signed)
Pt is aware.  

## 2023-12-06 ENCOUNTER — Telehealth: Payer: Self-pay | Admitting: Gastroenterology

## 2023-12-06 NOTE — Telephone Encounter (Signed)
Spoke with pt and she was unable to tell me the name of the medication that she is requesting to be called in. Advised pt to call back with the exact name of the medication from her medication bottle. Pt verbalized understanding.

## 2023-12-06 NOTE — Telephone Encounter (Signed)
Pt states that her food is not going down and when it does it feels like it is going to come back up. Pt is also constipated and bloated. Pt states that she is taking all of her medication including the pantoprazole bid and linzess.

## 2023-12-06 NOTE — Telephone Encounter (Signed)
Patient came into the office to say she got the Linzess 290 mcg but what she is wanting is the night time .  She said it feels like "its going to come back up" and she wants it to get changed.  [facility] on [address].

## 2023-12-06 NOTE — Telephone Encounter (Signed)
Pt called stating the linzess is not working pt is requesting something else to be called in to help her bowels move.

## 2023-12-06 NOTE — Telephone Encounter (Signed)
I am not sure that she is referring to Linzess.  We need to figure exactly what symptoms she is having and exactly what medication she is referring to.  The comment from [facility] where the patient said, "is going to come back up", makes me think she is referring to nausea, vomiting, or reflux.  Please verify so I can be sure that we are addressing her needs appropriately.

## 2023-12-06 NOTE — Telephone Encounter (Signed)
For her constipation and bloating, I recommend adding MiraLAX 17 g in 8 oz of water every day to Linzess. May increase MiraLAX to twice daily if once a day isn't enough.   For her swallowing problems, she recently underwent EGD on 10/12/23 that did not show any significant abnormalities in her esophagus that would be contributing to her swallowing problems. Dr. Marletta Lor did go ahead and stretch her esophagus. As she is still having a lot of problems, recommend barium pill esophagram.   For now, to help foods go down:  Eat slowly, take small bites, chew thoroughly, drink plenty of liquids throughout meals.  Avoid trough textures All meats should be chopped finely.   [place]ia Washington:  Please arrange BPE.  Dx: Dysphagia

## 2023-12-07 NOTE — Telephone Encounter (Signed)
No ans, vm not set up.

## 2023-12-10 NOTE — Telephone Encounter (Signed)
No ans, vm not set up.

## 2023-12-18 ENCOUNTER — Emergency Department (HOSPITAL_COMMUNITY)
Admission: EM | Admit: 2023-12-18 | Discharge: 2023-12-19 | Disposition: A | Discharge status: Home / Self Care | Payer: Medicare HMO | Attending: Emergency Medicine | Admitting: Emergency Medicine

## 2023-12-18 ENCOUNTER — Other Ambulatory Visit: Payer: Self-pay

## 2023-12-18 ENCOUNTER — Encounter (HOSPITAL_COMMUNITY): Payer: Self-pay

## 2023-12-18 DIAGNOSIS — I10 Essential (primary) hypertension: Secondary | ICD-10-CM | POA: Insufficient documentation

## 2023-12-18 DIAGNOSIS — Z79899 Other long term (current) drug therapy: Secondary | ICD-10-CM | POA: Insufficient documentation

## 2023-12-18 DIAGNOSIS — K6289 Other specified diseases of anus and rectum: Secondary | ICD-10-CM | POA: Diagnosis present

## 2023-12-18 DIAGNOSIS — K644 Residual hemorrhoidal skin tags: Secondary | ICD-10-CM | POA: Insufficient documentation

## 2023-12-18 NOTE — ED Triage Notes (Signed)
 Pt arrives ambulatory to ED with c/o hemorrhoid pain. Pain has been ongoing since Saturday. Last BM Saturday. Denies any blood.

## 2023-12-19 MED ORDER — HYDROCORTISONE ACETATE 25 MG RE SUPP: 25.0000 mg | Freq: Two times a day (BID) | RECTAL | 0 refills | Status: AC

## 2023-12-19 MED ORDER — HYDROCORTISONE (PERIANAL) 2.5 % EX CREA: 1.0000 | TOPICAL_CREAM | Freq: Two times a day (BID) | CUTANEOUS | 0 refills | Status: AC

## 2023-12-19 NOTE — ED Provider Notes (Signed)
 [place] EMERGENCY DEPARTMENT AT [facility] Provider Note   CSN: [id number] Arrival date & time: 12/18/23  2100     History  Chief Complaint  Patient presents with   Hemorrhoids    Virginia Washington is a 68 y.o. female.  HPI   This patient is a 68 year old female presenting to the hospital with a complaint of rectal pain, she has no history of hemorrhoids that she is aware of but does take medication for hypertension.  She has had 2 to 3 days of a rectal lump, she had a colonoscopy in October 2024 by Dr. Cindie which showed a small polyp in the transverse colon, internal hemorrhoids present, she denies fevers chills nausea or vomiting, she has pain with sitting, has been going on for 2 days  Home Medications Prior to Admission medications   Medication Sig Start Date End Date Taking? Authorizing Provider  hydrocortisone  (ANUSOL -HC) 2.5 % rectal cream Place 1 Application rectally 2 (two) times daily. 12/19/23  Yes Cleotilde Rogue, MD  hydrocortisone  (ANUSOL -HC) 25 MG suppository Place 1 suppository (25 mg total) rectally 2 (two) times daily. 12/19/23  Yes Cleotilde Rogue, MD  albuterol  (VENTOLIN  HFA) 108 [phone] Base) MCG/ACT inhaler Inhale 2 puffs into the lungs every 6 (six) hours as needed for wheezing or shortness of breath. 09/25/23   Odell Balls, PA-C  alum & mag hydroxide-simeth (MAALOX MAX) 400-400-40 MG/5ML suspension Take 15 mLs by mouth every 6 (six) hours as needed for indigestion. 06/23/22   Kommor, Madison, MD  amLODipine  (NORVASC ) 5 MG tablet Take 5 mg by mouth daily.    [provider]  atorvastatin  (LIPITOR) 20 MG tablet Take 20 mg by mouth daily. 10/03/22   [provider]  gabapentin (NEURONTIN) 100 MG capsule  12/13/22   [provider]  hydrochlorothiazide (HYDRODIURIL) 12.5 MG tablet Take 12.5 mg by mouth daily. 03/02/22   [provider]  ipratropium-albuterol  (DUONEB) 0.5-2.5 (3) MG/3ML SOLN SMARTSIG:1 Vial(s) Via Nebulizer  Every 4-6 Hours PRN 09/08/22   [provider]  linaclotide  (LINZESS ) 290 MCG CAPS capsule Take 1 capsule (290 mcg total) by mouth daily before breakfast. 12/04/23 12/03/24  Rudy Josette RAMAN, PA-C  ondansetron  (ZOFRAN ) 4 MG tablet Take 1 tablet (4 mg total) by mouth every 8 (eight) hours as needed for nausea or vomiting. 11/30/23   Mesner, Selinda, MD  ondansetron  (ZOFRAN ) 4 MG tablet Take 1 tablet (4 mg total) by mouth every 8 (eight) hours as needed for nausea or vomiting. 11/30/23   Mesner, Selinda, MD  pantoprazole  (PROTONIX ) 40 MG tablet Take 1 tablet (40 mg total) by mouth 2 (two) times daily before a meal. Patient not taking: Reported on 11/28/2023 07/19/23   Rudy Josette RAMAN, PA-C  simethicone  (MYLICON) 125 MG chewable tablet Chew 1 tablet after each meal and at bedtime if needed Patient not taking: Reported on 11/28/2023 01/12/22   Cindie Carlin POUR, DO  TRELEGY ELLIPTA 100-62.5-25 MCG/ACT AEPB  12/13/22   [provider]      Allergies    Patient has no known allergies.    Review of Systems   Review of Systems  All other systems reviewed and are negative.   Physical Exam Updated Vital Signs BP 109/69 (BP Location: Right Arm)   Pulse 70   Temp 99.1 F (37.3 C) (Oral)   Resp 18   Ht 1.575 m (5' 2)   Wt 61.2 kg   SpO2 97%   BMI 24.69 kg/m  Physical Exam Vitals  and nursing note reviewed.  Constitutional:      General: She is not in acute distress.    Appearance: She is well-developed.  HENT:     Head: Normocephalic and atraumatic.     Mouth/Throat:     Pharynx: No oropharyngeal exudate.  Eyes:     General: No scleral icterus.       Right eye: No discharge.        Left eye: No discharge.     Conjunctiva/sclera: Conjunctivae normal.     Pupils: Pupils are equal, round, and reactive to light.  Neck:     Thyroid: No thyromegaly.     Vascular: No JVD.  Cardiovascular:     Rate and Rhythm: Normal rate and regular rhythm.     Heart sounds: Normal heart  sounds. No murmur heard.    No friction rub. No gallop.  Pulmonary:     Effort: Pulmonary effort is normal. No respiratory distress.     Breath sounds: Normal breath sounds. No wheezing or rales.  Abdominal:     General: Bowel sounds are normal. There is no distension.     Palpations: Abdomen is soft. There is no mass.     Tenderness: There is no abdominal tenderness.  Genitourinary:    Comments: Chaperone present for exam, the patient's perineum was evaluated including the rectal tissues, she has a engorged but nonthrombosed hemorrhoid at the 12 o'clock position, there is no bleeding, normal-appearing stool, no signs of abscess Musculoskeletal:        General: No tenderness. Normal range of motion.     Cervical back: Normal range of motion and neck supple.  Lymphadenopathy:     Cervical: No cervical adenopathy.  Skin:    General: Skin is warm and dry.     Findings: No erythema or rash.  Neurological:     Mental Status: She is alert.     Coordination: Coordination normal.  Psychiatric:        Behavior: Behavior normal.     ED Results / Procedures / Treatments   Labs (all labs ordered are listed, but only abnormal results are displayed) Labs Reviewed - No data to display  EKG None  Radiology No results found.  Procedures Procedures    Medications Ordered in ED Medications - No data to display  ED Course/ Medical Decision Making/ A&P                                 Medical Decision Making  External hemorrhoid, recommendations made for stool softeners and medications, patient agreeable.  Vitals unremarkable, stable for discharge to follow-up with Dr. Cindie        Final Clinical Impression(s) / ED Diagnoses Final diagnoses:  External hemorrhoid    Rx / DC Orders ED Discharge Orders          Ordered    hydrocortisone  (ANUSOL -HC) 2.5 % rectal cream  2 times daily        12/19/23 0722    hydrocortisone  (ANUSOL -HC) 25 MG suppository  2 times daily         12/19/23 9277              Cleotilde Rogue, MD 12/19/23 [phone]

## 2023-12-19 NOTE — Discharge Instructions (Addendum)
 Please use a stool softener every day to keep your stools nice and soft, you may use the suppositories twice a day and apply the topical cream to the hemorrhoid.  This will get better over a couple of weeks but if it becomes severe and pain or starts heavy bleeding or you develop fevers return to the ER.  Call the office of Dr. Cindie to make a follow-up for the next 2 weeks.

## 2023-12-20 ENCOUNTER — Other Ambulatory Visit: Payer: Self-pay | Admitting: *Deleted

## 2023-12-20 DIAGNOSIS — R1310 Dysphagia, unspecified: Secondary | ICD-10-CM

## 2023-12-20 NOTE — Telephone Encounter (Signed)
Pt was made aware and verbalized understanding. Pt is ready to move forward with scheduling.  

## 2023-12-20 NOTE — Telephone Encounter (Signed)
 Pt informed BPE scheduled for 12/24/23, arrive at 8:45 am to check in at [facility]. Verbalized understanding.

## 2023-12-24 ENCOUNTER — Ambulatory Visit (HOSPITAL_COMMUNITY)
Admission: RE | Admit: 2023-12-24 | Discharge: 2023-12-24 | Disposition: A | Discharge status: Home / Self Care | Payer: Medicare HMO | Source: Ambulatory Visit | Attending: Gastroenterology | Admitting: Gastroenterology

## 2023-12-24 DIAGNOSIS — R1310 Dysphagia, unspecified: Secondary | ICD-10-CM | POA: Insufficient documentation

## 2023-12-24 DIAGNOSIS — K449 Diaphragmatic hernia without obstruction or gangrene: Secondary | ICD-10-CM | POA: Diagnosis not present

## 2024-01-01 ENCOUNTER — Encounter: Payer: Self-pay | Admitting: Gastroenterology

## 2024-01-01 ENCOUNTER — Ambulatory Visit (INDEPENDENT_AMBULATORY_CARE_PROVIDER_SITE_OTHER): Payer: Medicare HMO | Admitting: Gastroenterology

## 2024-01-01 VITALS — BP 136/78 | HR 72 | Temp 97.6°F | Ht 62.0 in | Wt 143.2 lb

## 2024-01-01 DIAGNOSIS — K581 Irritable bowel syndrome with constipation: Secondary | ICD-10-CM

## 2024-01-01 DIAGNOSIS — K449 Diaphragmatic hernia without obstruction or gangrene: Secondary | ICD-10-CM | POA: Diagnosis not present

## 2024-01-01 DIAGNOSIS — R1310 Dysphagia, unspecified: Secondary | ICD-10-CM | POA: Diagnosis not present

## 2024-01-01 DIAGNOSIS — Z8719 Personal history of other diseases of the digestive system: Secondary | ICD-10-CM | POA: Diagnosis not present

## 2024-01-01 DIAGNOSIS — R1013 Epigastric pain: Secondary | ICD-10-CM

## 2024-01-01 DIAGNOSIS — K224 Dyskinesia of esophagus: Secondary | ICD-10-CM | POA: Diagnosis not present

## 2024-01-01 DIAGNOSIS — K5900 Constipation, unspecified: Secondary | ICD-10-CM | POA: Diagnosis not present

## 2024-01-01 DIAGNOSIS — K219 Gastro-esophageal reflux disease without esophagitis: Secondary | ICD-10-CM

## 2024-01-01 DIAGNOSIS — K649 Unspecified hemorrhoids: Secondary | ICD-10-CM

## 2024-01-01 MED ORDER — PANTOPRAZOLE SODIUM 40 MG PO TBEC: 40.0000 mg | DELAYED_RELEASE_TABLET | Freq: Two times a day (BID) | ORAL | 3 refills | Status: DC

## 2024-01-01 MED ORDER — LINACLOTIDE 290 MCG PO CAPS: 290.0000 ug | ORAL_CAPSULE | Freq: Every day | ORAL | 3 refills | Status: DC

## 2024-01-01 NOTE — Patient Instructions (Addendum)
 I have sent in a refill for your pantoprazole  (Protonix ).  You need to take 1 of these twice daily, 30 minutes prior to breakfast and dinner.  I have also refilled your Linzess .  If you feel like this works better at nighttime you can take this prior to bed.  If there is any issues with cost and getting your medication please let me know and we can fill out patient assistance if needed.  Follow a GERD diet:  Avoid fried, fatty, greasy, spicy, citrus foods. Avoid caffeine and carbonated beverages. Avoid chocolate. Try eating 4-6 small meals a day rather than 3 large meals. Do not eat within 3 hours of laying down. Prop head of bed up on wood or bricks to create a 6 inch incline.  Dysphagia precautions: Eat slowly, take small bites, chew thoroughly, drink plenty of liquids throughout meals.  Avoid trough textures All meats should be chopped finely.    If you have recurrent hemorrhoid pain you may resume Anusol  and use sitz bath's as needed.  The best way to prevent hemorrhoids is to avoid straining and avoid spending more than 5 minutes on the commode at a time.  It was a pleasure to see you today. I want to create trusting relationships with patients. If you receive a survey regarding your visit,  I greatly appreciate you taking time to fill this out on paper or through your MyChart. I value your feedback.  Charmaine Melia, MSN, FNP-BC, AGACNP-BC [facility]

## 2024-01-01 NOTE — Progress Notes (Signed)
 GI Office Note    Referring Provider: Carlette Benita Area* Primary Care Physician:  Fanta, Tesfaye Demissie, MD Primary Gastroenterologist: Carlin POUR. Cindie, DO  Date:  01/01/2024  ID:  Virginia Washington, DOB [birth year], MRN [id number]   Chief Complaint   Chief Complaint  Patient presents with   Follow-up    Follow up. Think she has a hernia    History of Present Illness  Virginia Washington is a 68 y.o. female with a history of partial SBO in 2020 managed conservatively, constipation, GERD with esophagitis, dysphagia with esophageal stenosis and Schatzki's ring, chronic abdominal pain, COPD, DVT, HLD, HTN, and adenomatous colon polyps with presenting today with complaint of concern for hernia.  EGD 07/17/22: -Grade B reflux esophagitis s/p biopsy -Small hiatal hernia -Mild Schatzki's ring s/p dilation -Normal stomach and duodenum -Pathology with esophageal squamous and cardiac mucosa, no evidence of dysplasia or metaplasia -PPI twice daily recommended and repeat EGD with rebiopsy in 3 months  Last office visit 07/19/23.  Scheduled for EGD with dilation and colonoscopy.  Advised increase pantoprazole  to 40 mg twice daily.  Dysphagia precautions discussed and reviewed again.  Advised continue Linzess  and call to let know what dose she was taking.  In August 2024 patient reported she was not taking Linzess  given it was not at the pharmacy for her and was only taking MiraLAX  once a day and still complaining of constipation.  She was advised to increase her MiraLAX  to twice a day and also advised to continue the Linzess .  Prescription was sent in.  She was also encouraged to drink 64 ounces of water  daily.  EGD 10/12/23: -Small hiatal hernia -Z-line irregular, s/p biopsy -LA grade a reflux esophagitis without bleeding -Gastritis s/p biopsy -Normal duodenum -Pathology: Reactive gastropathy, negative H. pylori.  Esophageal mucosa with mild chronic nonspecific carditis, negative for  metaplasia or dysplasia. -Repeat EGD as needed for retreatment.  Continue BID PPI.   Colonoscopy 10/12/23: -Internal hemorrhoids -5 mm transverse colon polyp -Two 4-5 mm sigmoid polyps -Path: Tubular adenoma and hyperplastic polyps -Repeat colonoscopy in 5 years  ED visit 12/13 for N/V.  Complaint of epigastric pain for few hours prior to arrival.  Had noted multiple sick contacts at home with similar symptoms.  Also reported bloating and upper abdomen.  Labs with WBC 10.6.  GFR 57, creatinine 1.08.  Given pain medication, antiemetic, GI cocktail, and PPI IV.  CT scan obtained as noted below.  Able to tolerate p.o. prior to discharge.  Discharged with Zofran .  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  12/19 patient reported that she needed a refill of medication as well as something different to help her bowels move.  She reportedly was taking pantoprazole  twice daily and Linzess  290 mcg once daily and still complaining of constipation and bloating.  She was also reporting that food was not going down and feels like it wanted to come back up. [place] Washington, [place]) recommended adding MiraLAX  17 g once daily and increasing to twice daily if needed along with her Linzess .  Given there was no abnormality found on her recent EGD and empiric dilation was not improving her symptoms she was recommended to undergo a barium pill esophagram.  Dysphagia precautions were given.  ED visit for rectal pain 12/31.  Denied history of hemorrhoids.  Reported 2-3 days of rectal lump.  Having pain with sitting for 2  days.  Rectal exam revealed an engorged but nonthrombosed hemorrhoid at 12 o'clock position without bleeding and normal-appearing stool without evidence of abscess.  She was recommended to take stool softeners as well as her other medications and follow-up with GI.  Prescription for  hydrocortisone  cream and suppositories given.  BPE 12/24/23: -Small hiatal hernia -No GERD visualized -Normal-appearing esophagus with mild esophageal dysmotility -Barium tablet passed normally.  Today: GERD, dysphagia - She feels like she ran out of her pantoprazole  last month and has not picked up any since then. She believes she ran out of refills. She felt like her reflux was a little better on the medication. She is having mor AM symptoms. She has a globus sensation at the base of her neck. No N/V. No choking episodes. Only occurs with solids. Also some pill dysphagia. Does have epigastric pain that comes and goes. No identifiable triggers.   Constipation, bloating - Having to strain and reports she got a hemorrhoid and the cream is working. She likes to take Linzess  at night instead of morning. She has some Linzess  at home. Not taking any miralax  or stool softener.   Hemorrhoid - No bleeding now. It has stopped. No itching complaints. Just had soreness. Has cream to use as needed.    Wt Readings from Last 3 Encounters:  01/01/24 143 lb 3.2 oz (65 kg)  12/18/23 135 lb (61.2 kg)  11/30/23 135 lb (61.2 kg)    Current Outpatient Medications  Medication Sig Dispense Refill   albuterol  (VENTOLIN  HFA) 108 (90 Base) MCG/ACT inhaler Inhale 2 puffs into the lungs every 6 (six) hours as needed for wheezing or shortness of breath. 1 each 0   alum & mag hydroxide-simeth (MAALOX MAX) 400-400-40 MG/5ML suspension Take 15 mLs by mouth every 6 (six) hours as needed for indigestion. 355 mL 0   amLODipine  (NORVASC ) 5 MG tablet Take 5 mg by mouth daily.     atorvastatin  (LIPITOR) 20 MG tablet Take 20 mg by mouth daily.     hydrochlorothiazide (HYDRODIURIL) 12.5 MG tablet Take 12.5 mg by mouth daily.     hydrocortisone  (ANUSOL -HC) 2.5 % rectal cream Place 1 Application rectally 2 (two) times daily. 30 g 0   hydrocortisone  (ANUSOL -HC) 25 MG suppository Place 1 suppository (25 mg total) rectally 2 (two)  times daily. 12 suppository 0   ipratropium-albuterol  (DUONEB) 0.5-2.5 (3) MG/3ML SOLN SMARTSIG:1 Vial(s) Via Nebulizer Every 4-6 Hours PRN     linaclotide  (LINZESS ) 290 MCG CAPS capsule Take 1 capsule (290 mcg total) by mouth daily before breakfast. 30 capsule 3   ondansetron  (ZOFRAN ) 4 MG tablet Take 1 tablet (4 mg total) by mouth every 8 (eight) hours as needed for nausea or vomiting. 4 tablet 0   ondansetron  (ZOFRAN ) 4 MG tablet Take 1 tablet (4 mg total) by mouth every 8 (eight) hours as needed for nausea or vomiting. 30 tablet 0   gabapentin (NEURONTIN) 100 MG capsule  (Patient not taking: Reported on 01/01/2024)     pantoprazole  (PROTONIX ) 40 MG tablet Take 1 tablet (40 mg total) by mouth 2 (two) times daily before a meal. (Patient not taking: Reported on 01/01/2024) 60 tablet 3   simethicone  (MYLICON) 125 MG chewable tablet Chew 1 tablet after each meal and at bedtime if needed (Patient not taking: Reported on 11/28/2023) 120 tablet 5   TRELEGY ELLIPTA 100-62.5-25 MCG/ACT AEPB  (Patient not taking: Reported on 01/01/2024)     No current facility-administered medications for this visit.  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  Normal right aortoiliac vessels.  Three-vessel runoff bilaterally.  (Sluggish flow of the left side due to occlusion.   ABDOMINAL HYSTERECTOMY      BALLOON DILATION N/A 10/05/2020   Procedure: BALLOON DILATION;  Surgeon: Cindie Carlin POUR, DO;  Location: [facility];  Service: Endoscopy;  Laterality: N/A;   BALLOON DILATION N/A 07/17/2022   Procedure: BALLOON DILATION;  Surgeon: Cindie Carlin POUR, DO;  Location: [facility];  Service: Endoscopy;  Laterality: N/A;   BALLOON DILATION N/A 10/12/2023   Procedure: BALLOON DILATION;  Surgeon: Cindie Carlin POUR, DO;  [facility]: [facility];  Service: Endoscopy;  Laterality: N/A;   BIOPSY  10/05/2020   Procedure: BIOPSY;  Surgeon: Cindie Carlin POUR, DO;  [facility];  Service: Endoscopy;;   BIOPSY  07/17/2022   Procedure: BIOPSY;  Surgeon: Cindie Carlin POUR, DO;  [facility];  Service: Endoscopy;;   BIOPSY  10/12/2023   Procedure: BIOPSY;  Surgeon: Cindie Carlin POUR, DO;  [facility]: [facility];  Service: Endoscopy;;   CHOLECYSTECTOMY     COLONOSCOPY  2007   Dr. Golda: small internal hemorrhoids, otherwise normal. exam could be compromised due to quality of prep   COLONOSCOPY N/A 02/15/2018   Dr. Harvey: 2 simple adenomas, external and internal hemorrhoids. Next colonoscopy 2024-2026   COLONOSCOPY WITH PROPOFOL  N/A 10/12/2023   Procedure: COLONOSCOPY WITH PROPOFOL ;  Surgeon: Cindie Carlin POUR, DO;  [facility]: [facility];  Service: Endoscopy;  Laterality: N/A;  10:15 am, asa 3   ESOPHAGOGASTRODUODENOSCOPY N/A 05/09/2018   Procedure: ESOPHAGOGASTRODUODENOSCOPY (EGD);  Surgeon: Harvey Margo CROME, MD; web in the distal esophagus s/p dilated, mild gastritis biopsied, normal examined duodenum. Pathology with gastritis, negative for H. Pylori.     ESOPHAGOGASTRODUODENOSCOPY (EGD) WITH PROPOFOL  N/A 10/05/2020   Surgeon: Cindie Carlin POUR, DO; small hiatal hernia, grade B reflux esophagitis, benign-appearing esophageal stenosis dilated, gastritis biopsied- mild nonspecific reactive gastropathy, negative for H. pylori.   ESOPHAGOGASTRODUODENOSCOPY (EGD) WITH PROPOFOL  N/A  07/17/2022   Procedure: ESOPHAGOGASTRODUODENOSCOPY (EGD) WITH PROPOFOL ;  Surgeon: Cindie Carlin POUR, DO;  [facility];  Service: Endoscopy;  Laterality: N/A;  9:45am   ESOPHAGOGASTRODUODENOSCOPY (EGD) WITH PROPOFOL  N/A 10/12/2023   Procedure: ESOPHAGOGASTRODUODENOSCOPY (EGD) WITH PROPOFOL ;  Surgeon: Cindie Carlin POUR, DO;  [facility];  Service: Endoscopy;  Laterality: N/A;   FEMORAL-FEMORAL BYPASS GRAFT Bilateral 10/21/2018   Procedure: RIGHT TO LEFT FEMORAL ARTERY BYPASS GRAFT;  Surgeon: Harvey Carlin BRAVO, MD;  Location: [facility] OR;  Service: Vascular;  Laterality: Bilateral;   LOWER EXTREMITY ANGIOGRAPHY N/A 09/27/2018   Procedure: LOWER EXTREMITY ANGIOGRAPHY;  Surgeon: Harvey Carlin BRAVO, MD;  Location: [facility];  Service: Cardiovascular;  Laterality: N/A;   NM MYOVIEW  LTD  12/2014   [facility]: Normal Myoview .  Nonischemic.  POLYPECTOMY  10/12/2023   Procedure: POLYPECTOMY;  Surgeon: Cindie Carlin POUR, DO;  [facility]: [facility];  Service: Endoscopy;;   TRANSTHORACIC ECHOCARDIOGRAM  12/2011   EF 55 to 60%.  No regional wall motion normality.  GRII DD.  No significant valve disease.   VASCULAR SURGERY      Family History  Problem Relation Age of Onset   Hypertension Mother    Peripheral Artery Disease Brother    Colon cancer Neg Hx    Colon polyps Neg Hx     Allergies as of 01/01/2024   (No Known Allergies)    Social History   Socioeconomic History   Marital status: Widowed    Spouse name: Not on file   Number of children: Not on file   Years of education: Not on file   Highest education level: Not on file  Occupational History   Occupation: unemployed    Comment: Draws pension from her husband's life insurance policy  Tobacco Use   Smoking status: Every Day    Current packs/day: 0.50    Average packs/day: 0.5 packs/day for 40.0 years (20.0 ttl pk-yrs)    Types: Cigarettes   Smokeless tobacco: Never  Vaping Use   Vaping status:  Never Used  Substance and Sexual Activity   Alcohol  use: No    Alcohol /week: 0.0 standard drinks of alcohol    Drug use: No   Sexual activity: Not Currently  Other Topics Concern   Not on file  Social History Narrative   Widowed: GETS HUSBAND'S PENSION.  mother 3, grandmother of 81 with one great grandchild.  She currently lives alone.  She completed ninth grade.  Smokes a pack a day for 42 years.  Tries to walks for about 5 minutes at x7 days a week with plans to help revascularization.       HOBBIES: PUZZLE BOOKS.   Social Drivers of Corporate Investment Banker Strain: Not on file  Food Insecurity: Not on file  Transportation Needs: Not on file  Physical Activity: Not on file  Stress: Not on file  Social Connections: Not on file     Review of Systems   Gen: Denies fever, chills, anorexia. Denies fatigue, weakness, weight loss.  CV: Denies chest pain, palpitations, syncope, peripheral edema, and claudication. Resp: Denies dyspnea at rest, cough, wheezing, coughing up blood, and pleurisy. GI: See HPI Derm: Denies rash, itching, dry skin Psych: Denies depression, anxiety, memory loss, confusion. No homicidal or suicidal ideation.  Heme: Denies bruising, bleeding, and enlarged lymph nodes.  Physical Exam   BP 136/78 (BP Location: Right Arm, Patient Position: Sitting, Cuff Size: Normal)   Pulse 72   Temp 97.6 F (36.4 C) (Temporal)   Ht 5' 2 (1.575 m)   Wt 143 lb 3.2 oz (65 kg)   BMI 26.19 kg/m   General:   Alert and oriented. No distress noted. Pleasant and cooperative.  Head:  Normocephalic and atraumatic. Eyes:  Conjuctiva clear without scleral icterus.  Abdomen:  +BS, soft, non-tender and non-distended. No rebound or guarding. No HSM or masses noted. Rectal: deferred  Msk:  Symmetrical without gross deformities. Normal posture. Extremities:  Without edema. Neurologic:  Alert and  oriented x4 Psych:  Alert and cooperative. Normal mood and affect.  Assessment   Virginia Washington is a 68 y.o. female with a history of artial SBO in 2020 managed conservatively, constipation, GERD with esophagitis, dysphagia with esophageal stenosis and Schatzki's ring, chronic abdominal pain, COPD, DVT, HLD, HTN,  and adenomatous colon polyps presenting today with concern of hiatal hernia.  GERD, dysphagia: Patient with chronic GERD as well as dysphagia with history of esophageal stenosis and Schatzki's ring with most recent EGD without any stenosis or Schatzki's ring.  Recent BPE with evidence of small hiatal hernia and mild esophageal dysmotility.  No stenosis identified.  She had concerns that this all could be coming from hiatal hernia. Patient educated that she does have a small hiatal hernia however her symptoms are likely more secondary to mild dysmotility and uncontrolled GERD.  We discussed importance of acid suppression therefore we will resume PPI given she has been without this medication for about a month.  She did notice some mild improvement while taking this but given she has been without medication for a month she is having recurrent symptoms.  We discussed the results of her BPE.  GERD diet reinforced.  Dysphagia precautions also reinforced.  Provided separate written education regarding hiatal hernia and advised patient that given her hernia is very small, surgery is not recommended.  Constipation, bloating: Reports ongoing constipation.  Usually does well with Linzess .  Not currently taking any over-the-counter stool softener or MiraLAX .  She feels as though Linzess  works better in the evenings therefore she asked if she could take this way.  We discussed that she does not have to take medication in the morning that she can take Linzess  nightly if this helps improve her symptoms.  This will be the plan going forward.  Hemorrhoids: Recent nonthrombosed hemorrhoid noted in the ED.  She was given Anusol  cream and states her symptoms have resolved.  She was previously  having bleeding but currently no symptoms.  Also advised that she could use sitz bath's as needed.  PLAN   Anusol  as needed. Sitz baths as needed. Avoid straining Linzess  290 mcg at bedtime.  Pantoprazole  40 mg BID and if no improvement will likely switch to rabeprazole.  GERD diet. Dysphagia precautions reinforced. Separate written education regarding hiatal hernia provided. Follow up with [facility] in 3 months    Charmaine Melia, MSN, FNP-BC, AGACNP-BC [facility]

## 2024-01-01 NOTE — Addendum Note (Signed)
 Addended by: Aida Raider on: 01/01/2024 12:10 PM   Modules accepted: Orders

## 2024-01-03 DIAGNOSIS — F17219 Nicotine dependence, cigarettes, with unspecified nicotine-induced disorders: Secondary | ICD-10-CM | POA: Diagnosis not present

## 2024-01-03 DIAGNOSIS — Z0001 Encounter for general adult medical examination with abnormal findings: Secondary | ICD-10-CM | POA: Diagnosis not present

## 2024-01-03 DIAGNOSIS — Z1331 Encounter for screening for depression: Secondary | ICD-10-CM | POA: Diagnosis not present

## 2024-01-03 DIAGNOSIS — I10 Essential (primary) hypertension: Secondary | ICD-10-CM | POA: Diagnosis not present

## 2024-01-03 DIAGNOSIS — M545 Low back pain, unspecified: Secondary | ICD-10-CM | POA: Diagnosis not present

## 2024-01-03 DIAGNOSIS — N3946 Mixed incontinence: Secondary | ICD-10-CM | POA: Diagnosis not present

## 2024-01-03 DIAGNOSIS — Z23 Encounter for immunization: Secondary | ICD-10-CM | POA: Diagnosis not present

## 2024-01-03 DIAGNOSIS — Z1389 Encounter for screening for other disorder: Secondary | ICD-10-CM | POA: Diagnosis not present

## 2024-01-03 DIAGNOSIS — J449 Chronic obstructive pulmonary disease, unspecified: Secondary | ICD-10-CM | POA: Diagnosis not present

## 2024-01-09 DIAGNOSIS — Z1159 Encounter for screening for other viral diseases: Secondary | ICD-10-CM | POA: Diagnosis not present

## 2024-01-09 DIAGNOSIS — Z79899 Other long term (current) drug therapy: Secondary | ICD-10-CM | POA: Diagnosis not present

## 2024-01-09 DIAGNOSIS — K219 Gastro-esophageal reflux disease without esophagitis: Secondary | ICD-10-CM | POA: Diagnosis not present

## 2024-01-09 DIAGNOSIS — Z0001 Encounter for general adult medical examination with abnormal findings: Secondary | ICD-10-CM | POA: Diagnosis not present

## 2024-01-09 DIAGNOSIS — I10 Essential (primary) hypertension: Secondary | ICD-10-CM | POA: Diagnosis not present

## 2024-02-03 DIAGNOSIS — J440 Chronic obstructive pulmonary disease with acute lower respiratory infection: Secondary | ICD-10-CM | POA: Diagnosis not present

## 2024-02-03 DIAGNOSIS — I10 Essential (primary) hypertension: Secondary | ICD-10-CM | POA: Diagnosis not present

## 2024-03-02 DIAGNOSIS — J440 Chronic obstructive pulmonary disease with acute lower respiratory infection: Secondary | ICD-10-CM | POA: Diagnosis not present

## 2024-03-02 DIAGNOSIS — I10 Essential (primary) hypertension: Secondary | ICD-10-CM | POA: Diagnosis not present

## 2024-03-05 DIAGNOSIS — J440 Chronic obstructive pulmonary disease with acute lower respiratory infection: Secondary | ICD-10-CM | POA: Diagnosis not present

## 2024-03-05 DIAGNOSIS — I10 Essential (primary) hypertension: Secondary | ICD-10-CM | POA: Diagnosis not present

## 2024-03-06 DIAGNOSIS — J440 Chronic obstructive pulmonary disease with acute lower respiratory infection: Secondary | ICD-10-CM | POA: Diagnosis not present

## 2024-03-06 DIAGNOSIS — I10 Essential (primary) hypertension: Secondary | ICD-10-CM | POA: Diagnosis not present

## 2024-03-14 ENCOUNTER — Encounter: Payer: Self-pay | Admitting: Gastroenterology

## 2024-03-25 ENCOUNTER — Encounter (HOSPITAL_COMMUNITY): Payer: Self-pay | Admitting: Emergency Medicine

## 2024-03-25 ENCOUNTER — Emergency Department (HOSPITAL_COMMUNITY)

## 2024-03-25 ENCOUNTER — Other Ambulatory Visit: Payer: Self-pay

## 2024-03-25 ENCOUNTER — Emergency Department (HOSPITAL_COMMUNITY)
Admission: EM | Admit: 2024-03-25 | Discharge: 2024-03-25 | Disposition: A | Discharge status: Home / Self Care | Attending: Emergency Medicine | Admitting: Emergency Medicine

## 2024-03-25 DIAGNOSIS — R1013 Epigastric pain: Secondary | ICD-10-CM | POA: Insufficient documentation

## 2024-03-25 DIAGNOSIS — R109 Unspecified abdominal pain: Secondary | ICD-10-CM | POA: Diagnosis not present

## 2024-03-25 DIAGNOSIS — I10 Essential (primary) hypertension: Secondary | ICD-10-CM | POA: Insufficient documentation

## 2024-03-25 DIAGNOSIS — R1084 Generalized abdominal pain: Secondary | ICD-10-CM | POA: Diagnosis not present

## 2024-03-25 DIAGNOSIS — Z79899 Other long term (current) drug therapy: Secondary | ICD-10-CM | POA: Diagnosis not present

## 2024-03-25 LAB — CBC
HCT: 43.1 % (ref 36.0–46.0)
Hemoglobin: 13.5 g/dL (ref 12.0–15.0)
MCH: 27.4 pg (ref 26.0–34.0)
MCHC: 31.3 g/dL (ref 30.0–36.0)
MCV: 87.6 fL (ref 80.0–100.0)
Platelets: 236 10*3/uL (ref 150–400)
RBC: 4.92 MIL/uL (ref 3.87–5.11)
RDW: 14.6 % (ref 11.5–15.5)
WBC: 7 10*3/uL (ref 4.0–10.5)
nRBC: 0 % (ref 0.0–0.2)

## 2024-03-25 LAB — COMPREHENSIVE METABOLIC PANEL WITH GFR
ALT: 14 U/L (ref 0–44)
AST: 15 U/L (ref 15–41)
Albumin: 3.6 g/dL (ref 3.5–5.0)
Alkaline Phosphatase: 106 U/L (ref 38–126)
Anion gap: 10 (ref 5–15)
BUN: 11 mg/dL (ref 8–23)
CO2: 23 mmol/L (ref 22–32)
Calcium: 8.7 mg/dL — ABNORMAL LOW (ref 8.9–10.3)
Chloride: 110 mmol/L (ref 98–111)
Creatinine, Ser: 1.02 mg/dL — ABNORMAL HIGH (ref 0.44–1.00)
GFR, Estimated: >60 mL/min (ref >60)
Glucose, Bld: 118 mg/dL — ABNORMAL HIGH (ref 70–99)
Potassium: 3.1 mmol/L — ABNORMAL LOW (ref 3.5–5.1)
Sodium: 143 mmol/L (ref 135–145)
Total Bilirubin: 0.4 mg/dL (ref 0.0–1.2)
Total Protein: 6.9 g/dL (ref 6.5–8.1)

## 2024-03-25 LAB — LIPASE, BLOOD: Lipase: 47 U/L (ref 11–51)

## 2024-03-25 MED ORDER — ONDANSETRON HCL 4 MG/2ML IJ SOLN
4.0000 mg | Freq: Once | INTRAMUSCULAR | Status: AC
  Administered 2024-03-25: 4 mg via INTRAVENOUS
  Filled 2024-03-25: qty 2

## 2024-03-25 MED ORDER — SODIUM CHLORIDE 0.9 % IV BOLUS
1000.0000 mL | Freq: Once | INTRAVENOUS | Status: AC
  Administered 2024-03-25: 1000 mL via INTRAVENOUS

## 2024-03-25 MED ORDER — IOHEXOL 300 MG/ML  SOLN
100.0000 mL | Freq: Once | INTRAMUSCULAR | Status: AC | PRN
  Administered 2024-03-25: 80 mL via INTRAVENOUS

## 2024-03-25 MED ORDER — MORPHINE SULFATE (PF) 4 MG/ML IV SOLN
4.0000 mg | Freq: Once | INTRAVENOUS | Status: AC
  Administered 2024-03-25: 4 mg via INTRAVENOUS
  Filled 2024-03-25: qty 1

## 2024-03-25 NOTE — ED Provider Notes (Signed)
 [place] EMERGENCY DEPARTMENT AT [facility] Provider Note   CSN: [id number] Arrival date & time: 03/25/24  0221     History  Chief Complaint  Patient presents with   Abdominal Pain    Teja Costen Bean is a 68 y.o. female.  Patient is a 68 year old female with past medical history of hypertension, hyperlipidemia, peripheral artery disease, GERD, irritable bowel.  Patient presenting today for evaluation of abdominal pain.  She started with epigastric discomfort earlier this evening and 1 episode of vomiting.  No fevers or chills.  No diarrhea or constipation.  No aggravating or alleviating factors.       Home Medications Prior to Admission medications   Medication Sig Start Date End Date Taking? Authorizing Provider  albuterol (VENTOLIN HFA) 108 (90 Base) MCG/ACT inhaler Inhale 2 puffs into the lungs every 6 (six) hours as needed for wheezing or shortness of breath. 09/25/23   Elpidio Anis, PA-C  alum & mag hydroxide-simeth (MAALOX MAX) 400-400-40 MG/5ML suspension Take 15 mLs by mouth every 6 (six) hours as needed for indigestion. 06/23/22   Kommor, [place], MD  amLODipine (NORVASC) 5 MG tablet Take 5 mg by mouth daily.    [provider]  atorvastatin (LIPITOR) 20 MG tablet Take 20 mg by mouth daily. 10/03/22   [provider]  gabapentin (NEURONTIN) 100 MG capsule  12/13/22   [provider]  hydrochlorothiazide (HYDRODIURIL) 12.5 MG tablet Take 12.5 mg by mouth daily. 03/02/22   [provider]  hydrocortisone (ANUSOL-HC) 2.5 % rectal cream Place 1 Application rectally 2 (two) times daily. 12/19/23   Eber Hong, MD  hydrocortisone (ANUSOL-HC) 25 MG suppository Place 1 suppository (25 mg total) rectally 2 (two) times daily. 12/19/23   Eber Hong, MD  ipratropium-albuterol (DUONEB) 0.5-2.5 (3) MG/3ML SOLN SMARTSIG:1 Vial(s) Via Nebulizer Every 4-6 Hours PRN 09/08/22   [provider]  linaclotide (LINZESS) 290 MCG CAPS  capsule Take 1 capsule (290 mcg total) by mouth at bedtime. 01/01/24 12/31/24  Aida Raider, NP  ondansetron (ZOFRAN) 4 MG tablet Take 1 tablet (4 mg total) by mouth every 8 (eight) hours as needed for nausea or vomiting. 11/30/23   Mesner, Barbara Cower, MD  ondansetron (ZOFRAN) 4 MG tablet Take 1 tablet (4 mg total) by mouth every 8 (eight) hours as needed for nausea or vomiting. 11/30/23   Mesner, Barbara Cower, MD  pantoprazole (PROTONIX) 40 MG tablet Take 1 tablet (40 mg total) by mouth 2 (two) times daily before a meal. 01/01/24   Aida Raider, NP  simethicone (MYLICON) 125 MG chewable tablet Chew 1 tablet after each meal and at bedtime if needed Patient not taking: Reported on 11/28/2023 01/12/22   Lanelle Bal, DO  TRELEGY ELLIPTA 100-62.5-25 MCG/ACT AEPB  12/13/22   [provider]      Allergies    Patient has no known allergies.    Review of Systems   Review of Systems  All other systems reviewed and are negative.   Physical Exam Updated Vital Signs BP 136/73 (BP Location: Right Arm)   Pulse 70   Temp 98.1 F (36.7 C) (Oral)   Resp 20   Ht 5\' 2"  (1.575 m)   Wt 65 kg   SpO2 99%   BMI 26.21 kg/m  Physical Exam Vitals and nursing note reviewed.  Constitutional:      General: She is not in acute distress.    Appearance: She is well-developed. She is not diaphoretic.  HENT:  Head: Normocephalic and atraumatic.  Cardiovascular:     Rate and Rhythm: Normal rate and regular rhythm.     Heart sounds: No murmur heard.    No friction rub. No gallop.  Pulmonary:     Effort: Pulmonary effort is normal. No respiratory distress.     Breath sounds: Normal breath sounds. No wheezing.  Abdominal:     General: Bowel sounds are normal. There is no distension.     Palpations: Abdomen is soft.     Tenderness: There is abdominal tenderness in the epigastric area. There is no right CVA tenderness, left CVA tenderness, guarding or rebound.  Musculoskeletal:        General:  Normal range of motion.     Cervical back: Normal range of motion and neck supple.  Skin:    General: Skin is warm and dry.  Neurological:     General: No focal deficit present.     Mental Status: She is alert and oriented to person, place, and time.     ED Results / Procedures / Treatments   Labs (all labs ordered are listed, but only abnormal results are displayed) Labs Reviewed  COMPREHENSIVE METABOLIC PANEL WITH GFR - Abnormal; Notable for the following components:      Result Value   Potassium 3.1 (*)    Glucose, Bld 118 (*)    Creatinine, Ser 1.02 (*)    Calcium 8.7 (*)    All other components within normal limits  LIPASE, BLOOD  CBC  URINALYSIS, ROUTINE W REFLEX MICROSCOPIC    EKG None  Radiology No results found.  Procedures Procedures    Medications Ordered in ED Medications  sodium chloride 0.9 % bolus 1,000 mL (has no administration in time range)  ondansetron (ZOFRAN) injection 4 mg (has no administration in time range)  morphine (PF) 4 MG/ML injection 4 mg (has no administration in time range)    ED Course/ Medical Decision Making/ A&P  The patient is a 68 year old female presenting with abdominal pain as described in the HPI.  Patient arrives with stable vital signs and is afebrile.  Physical examination reveals mild abdominal tenderness, but no peritoneal signs.  Laboratory studies obtained including CBC, CMP, and lipase.  There is no leukocytosis, no elevation of liver or pancreatic enzymes, and no significant electrolyte derangement.  CT scan of the abdomen and pelvis obtained showing distal colonic fluid levels consistent with a diarrheal illness.  Patient has received IV fluids along with morphine for her pain and Zofran for her nausea.  She is now feeling better and resting comfortably.  At this point I feel as though she can safely be discharged.  Symptoms likely viral.  Final Clinical Impression(s) / ED Diagnoses Final diagnoses:  None     Rx / DC Orders ED Discharge Orders     None         Geoffery Lyons, MD 03/25/24 [phone]

## 2024-03-25 NOTE — Discharge Instructions (Signed)
 Continue medications as previously prescribed.  Follow-up with your primary doctor if symptoms persist, and return to the ER if symptoms significantly worsen or change.

## 2024-03-25 NOTE — ED Notes (Signed)
 Notified by tech that patient's SpO2 was low. Upon entering patient's room, patient was resting with SpO2 of 85% on room air. Patient placed on 2L via [place] with increase of SpO2

## 2024-03-25 NOTE — ED Triage Notes (Signed)
 Pt c/o abd pain and swelling that started tonight.

## 2024-04-02 DIAGNOSIS — I10 Essential (primary) hypertension: Secondary | ICD-10-CM | POA: Diagnosis not present

## 2024-04-02 DIAGNOSIS — J440 Chronic obstructive pulmonary disease with acute lower respiratory infection: Secondary | ICD-10-CM | POA: Diagnosis not present

## 2024-04-12 ENCOUNTER — Emergency Department (HOSPITAL_COMMUNITY)
Admission: EM | Admit: 2024-04-12 | Discharge: 2024-04-12 | Disposition: A | Discharge status: Home / Self Care | Attending: Emergency Medicine | Admitting: Emergency Medicine

## 2024-04-12 ENCOUNTER — Emergency Department (HOSPITAL_COMMUNITY)

## 2024-04-12 ENCOUNTER — Other Ambulatory Visit: Payer: Self-pay

## 2024-04-12 ENCOUNTER — Encounter (HOSPITAL_COMMUNITY): Payer: Self-pay | Admitting: Emergency Medicine

## 2024-04-12 DIAGNOSIS — S99922A Unspecified injury of left foot, initial encounter: Secondary | ICD-10-CM | POA: Diagnosis not present

## 2024-04-12 DIAGNOSIS — S90129A Contusion of unspecified lesser toe(s) without damage to nail, initial encounter: Secondary | ICD-10-CM

## 2024-04-12 DIAGNOSIS — W228XXA Striking against or struck by other objects, initial encounter: Secondary | ICD-10-CM | POA: Diagnosis not present

## 2024-04-12 DIAGNOSIS — S90122A Contusion of left lesser toe(s) without damage to nail, initial encounter: Secondary | ICD-10-CM | POA: Insufficient documentation

## 2024-04-12 DIAGNOSIS — Y9301 Activity, walking, marching and hiking: Secondary | ICD-10-CM | POA: Diagnosis not present

## 2024-04-12 DIAGNOSIS — M79675 Pain in left toe(s): Secondary | ICD-10-CM | POA: Diagnosis not present

## 2024-04-12 MED ORDER — ACETAMINOPHEN 500 MG PO TABS
1000.0000 mg | ORAL_TABLET | Freq: Once | ORAL | Status: AC
  Administered 2024-04-12: 1000 mg via ORAL
  Filled 2024-04-12: qty 2

## 2024-04-12 NOTE — Discharge Instructions (Signed)
 Please follow-up with your primary care doctor and return to the ER as needed.  The good news is your toe is not broken, and is just bruised.  Use ice and Tylenol  for pain control.  I provided you with a shoe, to help with comfort.  Return to the ER if your foot turns black, you do not have a pulse, or you have worsening symptoms

## 2024-04-12 NOTE — ED Triage Notes (Signed)
 Pt bib pov with complaints of pain and a possible injury to the 5th toe on her left foot. Pt states she walking and hit her foot on a chair yesterday. Pt is able to bare weight with a limp.

## 2024-04-12 NOTE — ED Provider Notes (Signed)
 [place] EMERGENCY DEPARTMENT AT [facility] Provider Note   CSN: [id number] Arrival date & time: 04/12/24  4098     History  Chief Complaint  Patient presents with   Toe Injury    Virginia Washington is a 68 y.o. female, history of PAD, who presents to the ED secondary to left fifth toe pain, this been going on for the last day.  She states yesterday she was walking around, and she stubbed her left pinky toe, on chair.  States since then, it has been throbbing, she has not tried any Tylenol  or ibuprofen .  Has wrapped it but has not applied any ice.  States it hurts to walk because it throbs so much.  States is little red and swollen.  Denies any other foot pain, ankle pain, did not fall or hit her head.    Home Medications Prior to Admission medications   Medication Sig Start Date End Date Taking? Authorizing Provider  albuterol  (VENTOLIN  HFA) 108 (90 Base) MCG/ACT inhaler Inhale 2 puffs into the lungs every 6 (six) hours as needed for wheezing or shortness of breath. 09/25/23   Mandy Second, PA-C  alum & mag hydroxide-simeth (MAALOX MAX) 400-400-40 MG/5ML suspension Take 15 mLs by mouth every 6 (six) hours as needed for indigestion. 06/23/22   Kommor, Madison, MD  amLODipine  (NORVASC ) 5 MG tablet Take 5 mg by mouth daily.    [provider]  atorvastatin  (LIPITOR) 20 MG tablet Take 20 mg by mouth daily. 10/03/22   [provider]  gabapentin (NEURONTIN) 100 MG capsule  12/13/22   [provider]  hydrochlorothiazide (HYDRODIURIL) 12.5 MG tablet Take 12.5 mg by mouth daily. 03/02/22   [provider]  hydrocortisone  (ANUSOL -HC) 2.5 % rectal cream Place 1 Application rectally 2 (two) times daily. 12/19/23   Early Glisson, MD  hydrocortisone  (ANUSOL -HC) 25 MG suppository Place 1 suppository (25 mg total) rectally 2 (two) times daily. 12/19/23   Early Glisson, MD  ipratropium-albuterol  (DUONEB) 0.5-2.5 (3) MG/3ML SOLN SMARTSIG:1 Vial(s) Via Nebulizer  Every 4-6 Hours PRN 09/08/22   [provider]  linaclotide  (LINZESS ) 290 MCG CAPS capsule Take 1 capsule (290 mcg total) by mouth at bedtime. 01/01/24 12/31/24  Eustacio Highman, NP  ondansetron  (ZOFRAN ) 4 MG tablet Take 1 tablet (4 mg total) by mouth every 8 (eight) hours as needed for nausea or vomiting. 11/30/23   Mesner, Reymundo Caulk, MD  ondansetron  (ZOFRAN ) 4 MG tablet Take 1 tablet (4 mg total) by mouth every 8 (eight) hours as needed for nausea or vomiting. 11/30/23   Mesner, Reymundo Caulk, MD  pantoprazole  (PROTONIX ) 40 MG tablet Take 1 tablet (40 mg total) by mouth 2 (two) times daily before a meal. 01/01/24   Eustacio Highman, NP  simethicone  (MYLICON) 125 MG chewable tablet Chew 1 tablet after each meal and at bedtime if needed Patient not taking: Reported on 11/28/2023 01/12/22   Vinetta Greening, DO  TRELEGY ELLIPTA 100-62.5-25 MCG/ACT AEPB  12/13/22   [provider]      Allergies    Patient has no known allergies.    Review of Systems   Review of Systems  Musculoskeletal:        +toe pain  Skin:  Negative for wound.    Physical Exam Updated Vital Signs BP 103/74   Pulse 64   Ht 5\' 2"  (1.575 m)   Wt 61.2 kg   SpO2 98%   BMI 24.69 kg/m  Physical Exam Vitals and nursing  note reviewed.  Constitutional:      General: She is not in acute distress.    Appearance: She is well-developed.  HENT:     Head: Normocephalic and atraumatic.  Eyes:     General:        Right eye: No discharge.        Left eye: No discharge.     Conjunctiva/sclera: Conjunctivae normal.  Pulmonary:     Effort: No respiratory distress.  Musculoskeletal:     Comments: Left foot: TTP of DIP of 5th phalanx w/mild ecchymoses noted No wounds. Able to bear weight but w/difficulty. Able to plantar flex and dorsiflex ankle. Inversion/eversion intact. Negative Thompson test. No midfoot or base of 5th metatarsal tenderness to palpation. Capillary refill <2sec. Dorsalis pedis pulse present. No foot  drop noted. Sensation intact. Warm to touch.    Neurological:     Mental Status: She is alert.     Comments: Clear speech.   Psychiatric:        Behavior: Behavior normal.        Thought Content: Thought content normal.     ED Results / Procedures / Treatments   Labs (all labs ordered are listed, but only abnormal results are displayed) Labs Reviewed - No data to display  EKG None  Radiology DG Toe 5th Left Result Date: 04/12/2024 CLINICAL DATA:  pain, possible trauma EXAM: DG TOE 5TH LEFT COMPARISON:  None Available. FINDINGS: There is no evidence of fracture or dislocation. Fusion across the DIP joint. There is no evidence of arthropathy or other focal bone abnormality. Soft tissues are unremarkable. IMPRESSION: Negative. Electronically Signed   By: Nicoletta Barrier M.D.   On: 04/12/2024 10:17    Procedures Procedures    Medications Ordered in ED Medications  acetaminophen  (TYLENOL ) tablet 1,000 mg (1,000 mg Oral Given 04/12/24 1012)    ED Course/ Medical Decision Making/ A&P                                 Medical Decision Making Patient is a 69 year old female, history of PID, here for left toe pain, after hitting it on an object yesterday.  She is overall well-appearing, the toe appears ecchymotic, and tender to the touch.  Specifically around the distal phalanx, we will obtain an x-ray, for further evaluation, and give Tylenol  and ice for symptom control.  Amount and/or Complexity of Data Reviewed Radiology: ordered.    Details: X-rays unremarkable Discussion of management or test interpretation with external provider(s): X-rays are unremarkable there is no evidence of any kind of fracture on exam.  She is overall well-appearing, and has good capillary refill, we will have her follow-up with her primary care doctor, and return if symptoms worsen.  Discharged home  Risk OTC drugs.    Final Clinical Impression(s) / ED Diagnoses Final diagnoses:  Contusion of fifth toe     Rx / DC Orders ED Discharge Orders     None         Shrihaan Porzio, Dwaine Gip, PA 04/12/24 1033    Deatra Face, MD 04/13/24 [phone]

## 2024-04-13 DIAGNOSIS — S90122A Contusion of left lesser toe(s) without damage to nail, initial encounter: Secondary | ICD-10-CM | POA: Diagnosis not present

## 2024-05-02 DIAGNOSIS — J440 Chronic obstructive pulmonary disease with acute lower respiratory infection: Secondary | ICD-10-CM | POA: Diagnosis not present

## 2024-05-02 DIAGNOSIS — I10 Essential (primary) hypertension: Secondary | ICD-10-CM | POA: Diagnosis not present

## 2024-06-02 DIAGNOSIS — I10 Essential (primary) hypertension: Secondary | ICD-10-CM | POA: Diagnosis not present

## 2024-06-02 DIAGNOSIS — J440 Chronic obstructive pulmonary disease with acute lower respiratory infection: Secondary | ICD-10-CM | POA: Diagnosis not present

## 2024-06-24 ENCOUNTER — Encounter (HOSPITAL_COMMUNITY): Payer: Self-pay | Admitting: Emergency Medicine

## 2024-06-24 ENCOUNTER — Emergency Department (HOSPITAL_COMMUNITY)
Admission: EM | Admit: 2024-06-24 | Discharge: 2024-06-25 | Disposition: A | Discharge status: Home / Self Care | Attending: Emergency Medicine | Admitting: Emergency Medicine

## 2024-06-24 DIAGNOSIS — M545 Low back pain, unspecified: Secondary | ICD-10-CM | POA: Diagnosis not present

## 2024-06-24 DIAGNOSIS — S39012A Strain of muscle, fascia and tendon of lower back, initial encounter: Secondary | ICD-10-CM

## 2024-06-24 NOTE — ED Triage Notes (Signed)
 Pt c/o lower back pain after trying to pick up something heavy yesterday.

## 2024-06-25 DIAGNOSIS — M545 Low back pain, unspecified: Secondary | ICD-10-CM | POA: Diagnosis not present

## 2024-06-25 MED ORDER — HYDROCODONE-ACETAMINOPHEN 5-325 MG PO TABS
2.0000 | ORAL_TABLET | Freq: Once | ORAL | Status: AC
  Administered 2024-06-25: 2 via ORAL
  Filled 2024-06-25: qty 2

## 2024-06-25 MED ORDER — HYDROCODONE-ACETAMINOPHEN 5-325 MG PO TABS: 1.0000 | ORAL_TABLET | Freq: Four times a day (QID) | ORAL | 0 refills | Status: AC | PRN

## 2024-06-25 MED ORDER — NAPROXEN 375 MG PO TABS: 375.0000 mg | ORAL_TABLET | Freq: Two times a day (BID) | ORAL | 0 refills | Status: AC

## 2024-06-25 NOTE — Discharge Instructions (Signed)
 Begin taking naproxen  as prescribed.  Begin taking hydrocodone  as prescribed as needed for pain not relieved with naproxen .  Follow-up with primary doctor if symptoms or not improving in the next week.

## 2024-06-25 NOTE — ED Provider Notes (Signed)
 [place] EMERGENCY DEPARTMENT AT [facility] Provider Note   CSN: [id number] Arrival date & time: 06/24/24  2225     Patient presents with: Back Pain   Virginia Washington is a 68 y.o. female.   Patient is a 68 year old female with past medical history of hypertension and hyperlipidemia.  Patient presenting today with complaints of back pain.  This started several days ago while lifting a heavy object.  She describes pain to her low back that radiates down her left leg.  She denies any weakness or numbness of the legs.  She denies any bowel or bladder complaints.  Pain is worse with bending, movement with no alleviating factors.       Prior to Admission medications   Medication Sig Start Date End Date Taking? Authorizing Provider  albuterol  (VENTOLIN  HFA) 108 (90 Base) MCG/ACT inhaler Inhale 2 puffs into the lungs every 6 (six) hours as needed for wheezing or shortness of breath. 09/25/23   Odell Balls, PA-C  alum & mag hydroxide-simeth (MAALOX MAX) 400-400-40 MG/5ML suspension Take 15 mLs by mouth every 6 (six) hours as needed for indigestion. 06/23/22   Kommor, Madison, MD  amLODipine  (NORVASC ) 5 MG tablet Take 5 mg by mouth daily.    [provider]  atorvastatin  (LIPITOR) 20 MG tablet Take 20 mg by mouth daily. 10/03/22   [provider]  gabapentin (NEURONTIN) 100 MG capsule  12/13/22   [provider]  hydrochlorothiazide (HYDRODIURIL) 12.5 MG tablet Take 12.5 mg by mouth daily. 03/02/22   [provider]  hydrocortisone  (ANUSOL -HC) 2.5 % rectal cream Place 1 Application rectally 2 (two) times daily. 12/19/23   Cleotilde Rogue, MD  hydrocortisone  (ANUSOL -HC) 25 MG suppository Place 1 suppository (25 mg total) rectally 2 (two) times daily. 12/19/23   Cleotilde Rogue, MD  ipratropium-albuterol  (DUONEB) 0.5-2.5 (3) MG/3ML SOLN SMARTSIG:1 Vial(s) Via Nebulizer Every 4-6 Hours PRN 09/08/22   [provider]  linaclotide  (LINZESS ) 290 MCG  CAPS capsule Take 1 capsule (290 mcg total) by mouth at bedtime. 01/01/24 12/31/24  Kennedy Charmaine CROME, NP  ondansetron  (ZOFRAN ) 4 MG tablet Take 1 tablet (4 mg total) by mouth every 8 (eight) hours as needed for nausea or vomiting. 11/30/23   Mesner, Selinda, MD  ondansetron  (ZOFRAN ) 4 MG tablet Take 1 tablet (4 mg total) by mouth every 8 (eight) hours as needed for nausea or vomiting. 11/30/23   Mesner, Selinda, MD  pantoprazole  (PROTONIX ) 40 MG tablet Take 1 tablet (40 mg total) by mouth 2 (two) times daily before a meal. 01/01/24   Kennedy Charmaine CROME, NP  simethicone  (MYLICON) 125 MG chewable tablet Chew 1 tablet after each meal and at bedtime if needed Patient not taking: Reported on 11/28/2023 01/12/22   Cindie Carlin POUR, DO  TRELEGY ELLIPTA 100-62.5-25 MCG/ACT AEPB  12/13/22   [provider]    Allergies: Patient has no known allergies.    Review of Systems  All other systems reviewed and are negative.   Updated Vital Signs BP 124/78   Pulse 71   Temp (!) 97.2 F (36.2 C) (Temporal)   Resp 18   Ht 5' 2 (1.575 m)   Wt 61.2 kg   SpO2 95%   BMI 24.68 kg/m   Physical Exam Vitals and nursing note reviewed.  Constitutional:      Appearance: Normal appearance.  Pulmonary:     Effort: Pulmonary effort is normal.  Musculoskeletal:     Comments: There is tenderness to palpation  in the soft tissues of the lumbar region.  Skin:    General: Skin is warm and dry.  Neurological:     Mental Status: She is alert and oriented to person, place, and time.     Comments: DTRs are 2+ and symmetrical in the patellar and Achilles tendons bilaterally.  Strength is 5 out of 5 in both lower extremities.  She is able to ambulate without difficulty.     (all labs ordered are listed, but only abnormal results are displayed) Labs Reviewed - No data to display  EKG: None  Radiology: No results found.   Procedures   Medications Ordered in the ED  HYDROcodone -acetaminophen   (NORCO/VICODIN) 5-325 MG per tablet 2 tablet (has no administration in time range)                                    Medical Decision Making Risk Prescription drug management.   Patient presenting with low back pain as described in the HPI.  I suspect a muscle strain and will treat with NSAIDs and hydrocodone .  Patient to follow-up with primary doctor if not improving in the next week.  There are no red flags on her exam that would suggest an emergent situation or cauda equina syndrome.  Strength and reflexes are symmetrical and she is complaining of no bowel or bladder issues.     Final diagnoses:  None    ED Discharge Orders     None          Geroldine Berg, MD 06/25/24 [phone]

## 2024-06-26 ENCOUNTER — Other Ambulatory Visit (HOSPITAL_COMMUNITY): Payer: Self-pay | Admitting: Gerontology

## 2024-06-26 ENCOUNTER — Ambulatory Visit (HOSPITAL_COMMUNITY)
Admission: RE | Admit: 2024-06-26 | Discharge: 2024-06-26 | Disposition: A | Discharge status: Home / Self Care | Source: Ambulatory Visit | Attending: Gerontology | Admitting: Gerontology

## 2024-06-26 DIAGNOSIS — J449 Chronic obstructive pulmonary disease, unspecified: Secondary | ICD-10-CM | POA: Diagnosis not present

## 2024-06-26 DIAGNOSIS — M545 Low back pain, unspecified: Secondary | ICD-10-CM | POA: Diagnosis not present

## 2024-06-26 DIAGNOSIS — M549 Dorsalgia, unspecified: Secondary | ICD-10-CM | POA: Diagnosis not present

## 2024-06-26 DIAGNOSIS — R52 Pain, unspecified: Secondary | ICD-10-CM | POA: Insufficient documentation

## 2024-06-26 DIAGNOSIS — I700 Atherosclerosis of aorta: Secondary | ICD-10-CM | POA: Diagnosis not present

## 2024-06-26 DIAGNOSIS — M51369 Other intervertebral disc degeneration, lumbar region without mention of lumbar back pain or lower extremity pain: Secondary | ICD-10-CM | POA: Diagnosis not present

## 2024-06-26 DIAGNOSIS — I10 Essential (primary) hypertension: Secondary | ICD-10-CM | POA: Diagnosis not present

## 2024-06-30 DIAGNOSIS — H5711 Ocular pain, right eye: Secondary | ICD-10-CM | POA: Diagnosis not present

## 2024-06-30 DIAGNOSIS — H1031 Unspecified acute conjunctivitis, right eye: Secondary | ICD-10-CM | POA: Diagnosis not present

## 2024-06-30 DIAGNOSIS — I10 Essential (primary) hypertension: Secondary | ICD-10-CM | POA: Diagnosis not present

## 2024-07-02 ENCOUNTER — Encounter: Payer: Self-pay | Admitting: Gastroenterology

## 2024-07-02 ENCOUNTER — Encounter: Payer: Self-pay | Admitting: *Deleted

## 2024-07-02 ENCOUNTER — Other Ambulatory Visit (HOSPITAL_COMMUNITY)
Admission: RE | Admit: 2024-07-02 | Discharge: 2024-07-02 | Disposition: A | Discharge status: Home / Self Care | Source: Ambulatory Visit | Attending: Gastroenterology | Admitting: Gastroenterology

## 2024-07-02 ENCOUNTER — Ambulatory Visit (INDEPENDENT_AMBULATORY_CARE_PROVIDER_SITE_OTHER): Admitting: Gastroenterology

## 2024-07-02 ENCOUNTER — Telehealth: Payer: Self-pay | Admitting: *Deleted

## 2024-07-02 VITALS — BP 134/84 | HR 55 | Temp 98.2°F | Ht 62.0 in | Wt 139.6 lb

## 2024-07-02 DIAGNOSIS — R1310 Dysphagia, unspecified: Secondary | ICD-10-CM | POA: Diagnosis not present

## 2024-07-02 DIAGNOSIS — R1013 Epigastric pain: Secondary | ICD-10-CM

## 2024-07-02 DIAGNOSIS — R1110 Vomiting, unspecified: Secondary | ICD-10-CM | POA: Diagnosis not present

## 2024-07-02 LAB — CBC WITH DIFFERENTIAL/PLATELET
Abs Immature Granulocytes: 0.02 K/uL (ref 0.00–0.07)
Basophils Absolute: 0 K/uL (ref 0.0–0.1)
Basophils Relative: 1 %
Eosinophils Absolute: 0.1 K/uL (ref 0.0–0.5)
Eosinophils Relative: 1 %
HCT: 45.3 % (ref 36.0–46.0)
Hemoglobin: 14.4 g/dL (ref 12.0–15.0)
Immature Granulocytes: 0 %
Lymphocytes Relative: 48 %
Lymphs Abs: 3.1 K/uL (ref 0.7–4.0)
MCH: 27.5 pg (ref 26.0–34.0)
MCHC: 31.8 g/dL (ref 30.0–36.0)
MCV: 86.5 fL (ref 80.0–100.0)
Monocytes Absolute: 0.5 K/uL (ref 0.1–1.0)
Monocytes Relative: 7 %
Neutro Abs: 2.7 K/uL (ref 1.7–7.7)
Neutrophils Relative %: 43 %
Platelets: 231 K/uL (ref 150–400)
RBC: 5.24 MIL/uL — ABNORMAL HIGH (ref 3.87–5.11)
RDW: 14.4 % (ref 11.5–15.5)
WBC: 6.4 K/uL (ref 4.0–10.5)
nRBC: 0 % (ref 0.0–0.2)

## 2024-07-02 LAB — COMPREHENSIVE METABOLIC PANEL WITH GFR
ALT: 18 U/L (ref 0–44)
AST: 19 U/L (ref 15–41)
Albumin: 3.7 g/dL (ref 3.5–5.0)
Alkaline Phosphatase: 118 U/L (ref 38–126)
Anion gap: 13 (ref 5–15)
BUN: 8 mg/dL (ref 8–23)
CO2: 22 mmol/L (ref 22–32)
Calcium: 8.9 mg/dL (ref 8.9–10.3)
Chloride: 104 mmol/L (ref 98–111)
Creatinine, Ser: 1.1 mg/dL — ABNORMAL HIGH (ref 0.44–1.00)
GFR, Estimated: 56 mL/min — ABNORMAL LOW (ref >60)
Glucose, Bld: 91 mg/dL (ref 70–99)
Potassium: 3.5 mmol/L (ref 3.5–5.1)
Sodium: 139 mmol/L (ref 135–145)
Total Bilirubin: 0.8 mg/dL (ref 0.0–1.2)
Total Protein: 6.8 g/dL (ref 6.5–8.1)

## 2024-07-02 LAB — LIPASE, BLOOD: Lipase: 40 U/L (ref 11–51)

## 2024-07-02 MED ORDER — PANTOPRAZOLE SODIUM 40 MG PO TBEC: 40.0000 mg | DELAYED_RELEASE_TABLET | Freq: Two times a day (BID) | ORAL | 3 refills | Status: AC

## 2024-07-02 NOTE — Progress Notes (Signed)
 Referring Provider: Fanta, Tesfaye Demissie* Primary Care Physician:  Fanta, Tesfaye Demissie, MD Primary GI Physician: Dr. Cindie  Chief Complaint  Patient presents with   Dysphagia    Food feels like it wants to come back up.     HPI:   Virginia Washington is a 68 y.o. female  with a history of partial SBO in 2020 managed conservatively, constipation, GERD with esophagitis, dysphagia with esophageal stenosis and Schatzki's ring, chronic abdominal pain, COPD, DVT, HLD, HTN, and adenomatous colon polyps, presenting today with complaint of dysphagia, food regurgitation.    Today:  Upper abdominal pain, started yesterday. Comes and goes. Feels abdomen is swollen across the top. Since yesterday, also noticed sensation of foods wanting to come back up. No nausea, food just wants to come back up. Feels like dysphagia is worse again. Items getting stuck in lower esophagus. Regurgitates her food about 5 minutes after eating. If drinking something, it comes up at times, but more so if her stomach is full.    No heartburn issues. Not taking pantoprazole . Has been off of this for about 2 months.   Bowels moving normally. No brbpr or melena.   Naproxen  daily since 06/24/24 for back pain.  No etoh.     Prior GI procedures: EGD 07/17/22: -Grade B reflux esophagitis s/p biopsy -Small hiatal hernia -Mild Schatzki's ring s/p dilation -Normal stomach and duodenum -Pathology with esophageal squamous and cardiac mucosa, no evidence of dysplasia or metaplasia -PPI twice daily recommended and repeat EGD with rebiopsy in 3 months  EGD 10/12/23: -Small hiatal hernia -Z-line irregular, s/p biopsy -LA grade a reflux esophagitis without bleeding -Gastritis s/p biopsy -Normal duodenum -Pathology: Reactive gastropathy, negative H. pylori.  Esophageal mucosa with mild chronic nonspecific carditis, negative for metaplasia or dysplasia. -Repeat EGD as needed for retreatment.  Continue BID PPI.   BPE  12/24/23 for persistent dysphagia: -Small hiatal hernia -No GERD visualized -Normal-appearing esophagus with mild esophageal dysmotility -Barium tablet passed normally.  Colonoscopy 10/12/23: -Internal hemorrhoids -5 mm transverse colon polyp -Two 4-5 mm sigmoid polyps -Path: Tubular adenoma and hyperplastic polyps -Repeat colonoscopy in 5 years  Past Medical History:  Diagnosis Date   Chronic back pain    Constipation    Constipation    COPD (chronic obstructive pulmonary disease) (HCC)    DVT (deep venous thrombosis) (HCC)    GERD (gastroesophageal reflux disease)    Headache    History of kidney stones    Hyperlipidemia    Hypertension     Past Surgical History:  Procedure Laterality Date   ABDOMINAL AORTOGRAM W/LOWER EXTREMITY N/A 10/26/2017   Procedure: ABDOMINAL AORTOGRAM W/LOWER EXTREMITY;  Surgeon: Harvey Carlin BRAVO, MD;  Location: [facility];  Service: Cardiovascular: 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  Normal right aortoiliac vessels.  Three-vessel runoff bilaterally.  (Sluggish flow of the left side due to occlusion.   ABDOMINAL HYSTERECTOMY     BALLOON DILATION N/A 10/05/2020   Procedure: BALLOON DILATION;  Surgeon: Cindie Carlin POUR, DO;  Location: [facility];  Service: Endoscopy;  Laterality: N/A;   BALLOON DILATION N/A 07/17/2022   Procedure: BALLOON DILATION;  Surgeon: Cindie Carlin POUR, DO;  Location: [facility];  Service: Endoscopy;  Laterality: N/A;   BALLOON DILATION N/A 10/12/2023   Procedure: BALLOON DILATION;  Surgeon: Cindie Carlin POUR, DO;  Location: [facility];  Service: Endoscopy;  Laterality: N/A;   BIOPSY  10/05/2020  Procedure: BIOPSY;  Surgeon: Cindie Carlin POUR, DO;  Location: [facility];  Service: Endoscopy;;   BIOPSY  07/17/2022   Procedure: BIOPSY;  Surgeon: Cindie Carlin POUR, DO;  Location: [facility];  Service: Endoscopy;;   BIOPSY  10/12/2023    Procedure: BIOPSY;  Surgeon: Cindie Carlin POUR, DO;  Location: [facility];  Service: Endoscopy;;   CHOLECYSTECTOMY     COLONOSCOPY  2007   Dr. Golda: small internal hemorrhoids, otherwise normal. exam could be compromised due to quality of prep   COLONOSCOPY N/A 02/15/2018   Dr. Harvey: 2 simple adenomas, external and internal hemorrhoids. Next colonoscopy 2024-2026   COLONOSCOPY WITH PROPOFOL  N/A 10/12/2023   Procedure: COLONOSCOPY WITH PROPOFOL ;  Surgeon: Cindie Carlin POUR, DO;  Location: [facility];  Service: Endoscopy;  Laterality: N/A;  10:15 am, asa 3   ESOPHAGOGASTRODUODENOSCOPY N/A 05/09/2018   Procedure: ESOPHAGOGASTRODUODENOSCOPY (EGD);  Surgeon: Harvey Margo CROME, MD; web in the distal esophagus s/p dilated, mild gastritis biopsied, normal examined duodenum. Pathology with gastritis, negative for H. Pylori.     ESOPHAGOGASTRODUODENOSCOPY (EGD) WITH PROPOFOL  N/A 10/05/2020   Surgeon: Cindie Carlin POUR, DO; small hiatal hernia, grade B reflux esophagitis, benign-appearing esophageal stenosis dilated, gastritis biopsied- mild nonspecific reactive gastropathy, negative for H. pylori.   ESOPHAGOGASTRODUODENOSCOPY (EGD) WITH PROPOFOL  N/A 07/17/2022   Procedure: ESOPHAGOGASTRODUODENOSCOPY (EGD) WITH PROPOFOL ;  Surgeon: Cindie Carlin POUR, DO;  [facility]: [facility];  Service: Endoscopy;  Laterality: N/A;  9:45am   ESOPHAGOGASTRODUODENOSCOPY (EGD) WITH PROPOFOL  N/A 10/12/2023   Procedure: ESOPHAGOGASTRODUODENOSCOPY (EGD) WITH PROPOFOL ;  Surgeon: Cindie Carlin POUR, DO;  Location: [facility];  Service: Endoscopy;  Laterality: N/A;   FEMORAL-FEMORAL BYPASS GRAFT Bilateral 10/21/2018   Procedure: RIGHT TO LEFT FEMORAL ARTERY BYPASS GRAFT;  Surgeon: Harvey Carlin BRAVO, MD;  Location: [facility] OR;  Service: Vascular;  Laterality: Bilateral;   LOWER EXTREMITY ANGIOGRAPHY N/A 09/27/2018   Procedure: LOWER EXTREMITY ANGIOGRAPHY;  Surgeon: Harvey Carlin BRAVO, MD;  Location: [facility];  Service:  Cardiovascular;  Laterality: N/A;   NM MYOVIEW  LTD  12/2014   [facility]: Normal Myoview .  Nonischemic.   POLYPECTOMY  10/12/2023   Procedure: POLYPECTOMY;  Surgeon: Cindie Carlin POUR, DO;  Location: [facility];  Service: Endoscopy;;   TRANSTHORACIC ECHOCARDIOGRAM  12/2011   EF 55 to 60%.  No regional wall motion normality.  GRII DD.  No significant valve disease.   VASCULAR SURGERY      Current Outpatient Medications  Medication Sig Dispense Refill   albuterol  (VENTOLIN  HFA) 108 (90 Base) MCG/ACT inhaler Inhale 2 puffs into the lungs every 6 (six) hours as needed for wheezing or shortness of breath. 1 each 0   alum & mag hydroxide-simeth (MAALOX MAX) 400-400-40 MG/5ML suspension Take 15 mLs by mouth every 6 (six) hours as needed for indigestion. 355 mL 0   amLODipine  (NORVASC ) 5 MG tablet Take 5 mg by mouth daily.     atorvastatin  (LIPITOR) 20 MG tablet Take 20 mg by mouth daily.     gabapentin (NEURONTIN) 100 MG capsule      hydrochlorothiazide (HYDRODIURIL) 12.5 MG tablet Take 12.5 mg by mouth daily.     HYDROcodone -acetaminophen  (NORCO/VICODIN) 5-325 MG tablet Take 1-2 tablets by mouth every 6 (six) hours as needed. 15 tablet 0   hydrocortisone  (ANUSOL -HC) 2.5 % rectal cream Place 1 Application rectally 2 (two) times daily. 30 g 0   hydrocortisone  (ANUSOL -HC) 25 MG suppository Place 1 suppository (25 mg total) rectally 2 (  two) times daily. 12 suppository 0   ipratropium-albuterol  (DUONEB) 0.5-2.5 (3) MG/3ML SOLN SMARTSIG:1 Vial(s) Via Nebulizer Every 4-6 Hours PRN     linaclotide  (LINZESS ) 290 MCG CAPS capsule Take 1 capsule (290 mcg total) by mouth at bedtime. 30 capsule 3   naproxen  (NAPROSYN ) 375 MG tablet Take 1 tablet (375 mg total) by mouth 2 (two) times daily. 20 tablet 0   ondansetron  (ZOFRAN ) 4 MG tablet Take 1 tablet (4 mg total) by mouth every 8 (eight) hours as needed for nausea or vomiting. 4 tablet 0   ondansetron  (ZOFRAN ) 4 MG tablet Take 1 tablet (4 mg total)  by mouth every 8 (eight) hours as needed for nausea or vomiting. 30 tablet 0   pantoprazole  (PROTONIX ) 40 MG tablet Take 1 tablet (40 mg total) by mouth 2 (two) times daily. 60 tablet 3   simethicone  (MYLICON) 125 MG chewable tablet Chew 1 tablet after each meal and at bedtime if needed (Patient not taking: Reported on 07/02/2024) 120 tablet 5   TRELEGY ELLIPTA 100-62.5-25 MCG/ACT AEPB  (Patient not taking: Reported on 07/02/2024)     No current facility-administered medications for this visit.    Allergies as of 07/02/2024   (No Known Allergies)    Family History  Problem Relation Age of Onset   Hypertension Mother    Peripheral Artery Disease Brother    Colon cancer Neg Hx    Colon polyps Neg Hx     Social History   Socioeconomic History   Marital status: Widowed    Spouse name: Not on file   Number of children: Not on file   Years of education: Not on file   Highest education level: Not on file  Occupational History   Occupation: unemployed    Comment: Draws pension from her husband's life insurance policy  Tobacco Use   Smoking status: Every Day    Current packs/day: 0.50    Average packs/day: 0.5 packs/day for 40.0 years (20.0 ttl pk-yrs)    Types: Cigarettes   Smokeless tobacco: Never  Vaping Use   Vaping status: Never Used  Substance and Sexual Activity   Alcohol  use: No    Alcohol /week: 0.0 standard drinks of alcohol    Drug use: No   Sexual activity: Not Currently  Other Topics Concern   Not on file  Social History Narrative   Widowed: GETS HUSBAND'S PENSION.  mother 3, grandmother of 59 with one great grandchild.  She currently lives alone.  She completed ninth grade.  Smokes a pack a day for 42 years.  Tries to walks for about 5 minutes at x7 days a week with plans to help revascularization.       HOBBIES: PUZZLE BOOKS.   Social Drivers of Corporate investment banker Strain: Not on file  Food Insecurity: Not on file  Transportation Needs: Not on file   Physical Activity: Not on file  Stress: Not on file  Social Connections: Not on file    Review of Systems: Gen: Denies fever, chills, cold or flulike symptoms, presyncope, syncope. CV: Denies chest pain, palpitations. Resp: Denies dyspnea, cough. GI: See HPI  Heme: See HPI  Physical Exam: BP 134/84 (BP Location: Right Arm, Patient Position: Sitting, Cuff Size: Normal)   Pulse (!) 55   Temp 98.2 F (36.8 C) (Temporal)   Ht 5' 2 (1.575 m)   Wt 139 lb 9.6 oz (63.3 kg)   BMI 25.53 kg/m  General:   Alert and oriented. No distress noted.  Pleasant and cooperative.  Head:  Normocephalic and atraumatic. Eyes:  Conjuctiva clear without scleral icterus. Heart:  S1, S2 present without murmurs appreciated. Lungs:  Clear to auscultation bilaterally. No wheezes, rales, or rhonchi. No distress.  Abdomen:  +BS, soft, and non-distended. Moderate TTP in epigastric area. No rebound or guarding. No HSM or masses noted. Msk:  Symmetrical without gross deformities. Normal posture. Extremities:  Without edema. Neurologic:  Alert and  oriented x4 Psych:  Normal mood and affect.    Assessment:  68 y.o. female  with a history of partial SBO in 2020 managed conservatively, constipation, GERD with esophagitis, dysphagia with esophageal stenosis and Schatzki's ring, chronic abdominal pain, COPD, DVT, HLD, HTN, and adenomatous colon polyps, presenting today with complaint of dysphagia, food regurgitation, and epigastric pain.   Patient with acute onset of symptoms yesterday with persistent symptoms today.  States food gets stuck in her lower esophagus and comes back up within 5 minutes of eating.  She is able to keep some liquids down.  Denies any associated nausea with this.  She is having intermittent epigastric pain that seems to worsen with p.o. intake as well.  Denies heartburn, and not taking pantoprazole  for the last 2 months.  Denies NSAIDs until she was prescribed naproxen  on 7/8 for back pain  which she has been taking once a day.   Query recurrent esophageal stenosis versus Schatzki's ring in the absence of PPI.  Could also have gastritis, duodenitis, PUD contributing to her epigastric pain.  Less likely biliary etiology in the setting of prior cholecystectomy.  Symptoms are not consistent with SBO as she has continued to have normal bowel movements and has a soft abdomen on exam today with normal bowel sounds.   Plan:  CBC, CMP, lipase Proceed with upper endoscopy with propofol  by Dr. Cindie. The risks, benefits, and alternatives have been discussed with the patient in detail. The patient states understanding and desires to proceed.  ASA 3, but ok to schedule in endo room 1 or 2 Resume pantoprazole  40 mg twice daily. Stop Naproxen .  OK to use tylenol  for pain.  Recommended soft/full liquid diet for now. Advised if she was not able to at least tolerate a soft/liquid diet, she would need to proceed to the emergency room for expedited workup. Follow-up after EGD.   Josette Centers, PA-C [facility] 07/02/2024

## 2024-07-02 NOTE — H&P (View-Only) (Signed)
 Referring Provider: Fanta, Tesfaye Demissie* Primary Care Physician:  Fanta, Tesfaye Demissie, MD Primary GI Physician: Dr. Cindie  Chief Complaint  Patient presents with   Dysphagia    Food feels like it wants to come back up.     HPI:   Virginia Washington is a 68 y.o. female  with a history of partial SBO in 2020 managed conservatively, constipation, GERD with esophagitis, dysphagia with esophageal stenosis and Schatzki's ring, chronic abdominal pain, COPD, DVT, HLD, HTN, and adenomatous colon polyps, presenting today with complaint of dysphagia, food regurgitation.    Today:  Upper abdominal pain, started yesterday. Comes and goes. Feels abdomen is swollen across the top. Since yesterday, also noticed sensation of foods wanting to come back up. No nausea, food just wants to come back up. Feels like dysphagia is worse again. Items getting stuck in lower esophagus. Regurgitates her food about 5 minutes after eating. If drinking something, it comes up at times, but more so if her stomach is full.    No heartburn issues. Not taking pantoprazole . Has been off of this for about 2 months.   Bowels moving normally. No brbpr or melena.   Naproxen  daily since 06/24/24 for back pain.  No etoh.     Prior GI procedures: EGD 07/17/22: -Grade B reflux esophagitis s/p biopsy -Small hiatal hernia -Mild Schatzki's ring s/p dilation -Normal stomach and duodenum -Pathology with esophageal squamous and cardiac mucosa, no evidence of dysplasia or metaplasia -PPI twice daily recommended and repeat EGD with rebiopsy in 3 months  EGD 10/12/23: -Small hiatal hernia -Z-line irregular, s/p biopsy -LA grade a reflux esophagitis without bleeding -Gastritis s/p biopsy -Normal duodenum -Pathology: Reactive gastropathy, negative H. pylori.  Esophageal mucosa with mild chronic nonspecific carditis, negative for metaplasia or dysplasia. -Repeat EGD as needed for retreatment.  Continue BID PPI.   BPE  12/24/23 for persistent dysphagia: -Small hiatal hernia -No GERD visualized -Normal-appearing esophagus with mild esophageal dysmotility -Barium tablet passed normally.  Colonoscopy 10/12/23: -Internal hemorrhoids -5 mm transverse colon polyp -Two 4-5 mm sigmoid polyps -Path: Tubular adenoma and hyperplastic polyps -Repeat colonoscopy in 5 years  Past Medical History:  Diagnosis Date   Chronic back pain    Constipation    Constipation    COPD (chronic obstructive pulmonary disease) (HCC)    DVT (deep venous thrombosis) (HCC)    GERD (gastroesophageal reflux disease)    Headache    History of kidney stones    Hyperlipidemia    Hypertension     Past Surgical History:  Procedure Laterality Date   ABDOMINAL AORTOGRAM W/LOWER EXTREMITY N/A 10/26/2017   Procedure: ABDOMINAL AORTOGRAM W/LOWER EXTREMITY;  Surgeon: Harvey Carlin BRAVO, MD;  Location: [facility];  Service: Cardiovascular: 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  Normal right aortoiliac vessels.  Three-vessel runoff bilaterally.  (Sluggish flow of the left side due to occlusion.   ABDOMINAL HYSTERECTOMY     BALLOON DILATION N/A 10/05/2020   Procedure: BALLOON DILATION;  Surgeon: Cindie Carlin POUR, DO;  Location: [facility];  Service: Endoscopy;  Laterality: N/A;   BALLOON DILATION N/A 07/17/2022   Procedure: BALLOON DILATION;  Surgeon: Cindie Carlin POUR, DO;  Location: [facility];  Service: Endoscopy;  Laterality: N/A;   BALLOON DILATION N/A 10/12/2023   Procedure: BALLOON DILATION;  Surgeon: Cindie Carlin POUR, DO;  [facility]: [facility];  Service: Endoscopy;  Laterality: N/A;   BIOPSY  10/05/2020  Procedure: BIOPSY;  Surgeon: Cindie Carlin POUR, DO;  [facility];  Service: Endoscopy;;   BIOPSY  07/17/2022   Procedure: BIOPSY;  Surgeon: Cindie Carlin POUR, DO;  [facility];  Service: Endoscopy;;   BIOPSY  10/12/2023    Procedure: BIOPSY;  Surgeon: Cindie Carlin POUR, DO;  [facility];  Service: Endoscopy;;   CHOLECYSTECTOMY     COLONOSCOPY  2007   Dr. Golda: small internal hemorrhoids, otherwise normal. exam could be compromised due to quality of prep   COLONOSCOPY N/A 02/15/2018   Dr. Harvey: 2 simple adenomas, external and internal hemorrhoids. Next colonoscopy 2024-2026   COLONOSCOPY WITH PROPOFOL  N/A 10/12/2023   Procedure: COLONOSCOPY WITH PROPOFOL ;  Surgeon: Cindie Carlin POUR, DO;  [facility];  Service: Endoscopy;  Laterality: N/A;  10:15 am, asa 3   ESOPHAGOGASTRODUODENOSCOPY N/A 05/09/2018   Procedure: ESOPHAGOGASTRODUODENOSCOPY (EGD);  Surgeon: Harvey Margo CROME, MD; web in the distal esophagus s/p dilated, mild gastritis biopsied, normal examined duodenum. Pathology with gastritis, negative for H. Pylori.     ESOPHAGOGASTRODUODENOSCOPY (EGD) WITH PROPOFOL  N/A 10/05/2020   Surgeon: Cindie Carlin POUR, DO; small hiatal hernia, grade B reflux esophagitis, benign-appearing esophageal stenosis dilated, gastritis biopsied- mild nonspecific reactive gastropathy, negative for H. pylori.   ESOPHAGOGASTRODUODENOSCOPY (EGD) WITH PROPOFOL  N/A 07/17/2022   Procedure: ESOPHAGOGASTRODUODENOSCOPY (EGD) WITH PROPOFOL ;  Surgeon: Cindie Carlin POUR, DO;  [facility];  Service: Endoscopy;  Laterality: N/A;  9:45am   ESOPHAGOGASTRODUODENOSCOPY (EGD) WITH PROPOFOL  N/A 10/12/2023   Procedure: ESOPHAGOGASTRODUODENOSCOPY (EGD) WITH PROPOFOL ;  Surgeon: Cindie Carlin POUR, DO;  [facility];  Service: Endoscopy;  Laterality: N/A;   FEMORAL-FEMORAL BYPASS GRAFT Bilateral 10/21/2018   Procedure: RIGHT TO LEFT FEMORAL ARTERY BYPASS GRAFT;  Surgeon: Harvey Carlin BRAVO, MD;  Location: [facility] OR;  Service: Vascular;  Laterality: Bilateral;   LOWER EXTREMITY ANGIOGRAPHY N/A 09/27/2018   Procedure: LOWER EXTREMITY ANGIOGRAPHY;  Surgeon: Harvey Carlin BRAVO, MD;  Location: [facility];  Service:  Cardiovascular;  Laterality: N/A;   NM MYOVIEW  LTD  12/2014   [facility]: Normal Myoview .  Nonischemic.   POLYPECTOMY  10/12/2023   Procedure: POLYPECTOMY;  Surgeon: Cindie Carlin POUR, DO;  [facility];  Service: Endoscopy;;   TRANSTHORACIC ECHOCARDIOGRAM  12/2011   EF 55 to 60%.  No regional wall motion normality.  GRII DD.  No significant valve disease.   VASCULAR SURGERY      Current Outpatient Medications  Medication Sig Dispense Refill   albuterol  (VENTOLIN  HFA) 108 (90 Base) MCG/ACT inhaler Inhale 2 puffs into the lungs every 6 (six) hours as needed for wheezing or shortness of breath. 1 each 0   alum & mag hydroxide-simeth (MAALOX MAX) 400-400-40 MG/5ML suspension Take 15 mLs by mouth every 6 (six) hours as needed for indigestion. 355 mL 0   amLODipine  (NORVASC ) 5 MG tablet Take 5 mg by mouth daily.     atorvastatin  (LIPITOR) 20 MG tablet Take 20 mg by mouth daily.     gabapentin (NEURONTIN) 100 MG capsule      hydrochlorothiazide (HYDRODIURIL) 12.5 MG tablet Take 12.5 mg by mouth daily.     HYDROcodone -acetaminophen  (NORCO/VICODIN) 5-325 MG tablet Take 1-2 tablets by mouth every 6 (six) hours as needed. 15 tablet 0   hydrocortisone  (ANUSOL -HC) 2.5 % rectal cream Place 1 Application rectally 2 (two) times daily. 30 g 0   hydrocortisone  (ANUSOL -HC) 25 MG suppository Place 1 suppository (25 mg total) rectally 2 (  two) times daily. 12 suppository 0   ipratropium-albuterol  (DUONEB) 0.5-2.5 (3) MG/3ML SOLN SMARTSIG:1 Vial(s) Via Nebulizer Every 4-6 Hours PRN     linaclotide  (LINZESS ) 290 MCG CAPS capsule Take 1 capsule (290 mcg total) by mouth at bedtime. 30 capsule 3   naproxen  (NAPROSYN ) 375 MG tablet Take 1 tablet (375 mg total) by mouth 2 (two) times daily. 20 tablet 0   ondansetron  (ZOFRAN ) 4 MG tablet Take 1 tablet (4 mg total) by mouth every 8 (eight) hours as needed for nausea or vomiting. 4 tablet 0   ondansetron  (ZOFRAN ) 4 MG tablet Take 1 tablet (4 mg total)  by mouth every 8 (eight) hours as needed for nausea or vomiting. 30 tablet 0   pantoprazole  (PROTONIX ) 40 MG tablet Take 1 tablet (40 mg total) by mouth 2 (two) times daily. 60 tablet 3   simethicone  (MYLICON) 125 MG chewable tablet Chew 1 tablet after each meal and at bedtime if needed (Patient not taking: Reported on 07/02/2024) 120 tablet 5   TRELEGY ELLIPTA 100-62.5-25 MCG/ACT AEPB  (Patient not taking: Reported on 07/02/2024)     No current facility-administered medications for this visit.    Allergies as of 07/02/2024   (No Known Allergies)    Family History  Problem Relation Age of Onset   Hypertension Mother    Peripheral Artery Disease Brother    Colon cancer Neg Hx    Colon polyps Neg Hx     Social History   Socioeconomic History   Marital status: Widowed    Spouse name: Not on file   Number of children: Not on file   Years of education: Not on file   Highest education level: Not on file  Occupational History   Occupation: unemployed    Comment: Draws pension from her husband's life insurance policy  Tobacco Use   Smoking status: Every Day    Current packs/day: 0.50    Average packs/day: 0.5 packs/day for 40.0 years (20.0 ttl pk-yrs)    Types: Cigarettes   Smokeless tobacco: Never  Vaping Use   Vaping status: Never Used  Substance and Sexual Activity   Alcohol  use: No    Alcohol /week: 0.0 standard drinks of alcohol    Drug use: No   Sexual activity: Not Currently  Other Topics Concern   Not on file  Social History Narrative   Widowed: GETS HUSBAND'S PENSION.  mother 3, grandmother of 59 with one great grandchild.  She currently lives alone.  She completed ninth grade.  Smokes a pack a day for 42 years.  Tries to walks for about 5 minutes at x7 days a week with plans to help revascularization.       HOBBIES: PUZZLE BOOKS.   Social Drivers of Corporate investment banker Strain: Not on file  Food Insecurity: Not on file  Transportation Needs: Not on file   Physical Activity: Not on file  Stress: Not on file  Social Connections: Not on file    Review of Systems: Gen: Denies fever, chills, cold or flulike symptoms, presyncope, syncope. CV: Denies chest pain, palpitations. Resp: Denies dyspnea, cough. GI: See HPI  Heme: See HPI  Physical Exam: BP 134/84 (BP Location: Right Arm, Patient Position: Sitting, Cuff Size: Normal)   Pulse (!) 55   Temp 98.2 F (36.8 C) (Temporal)   Ht 5' 2 (1.575 m)   Wt 139 lb 9.6 oz (63.3 kg)   BMI 25.53 kg/m  General:   Alert and oriented. No distress noted.  Pleasant and cooperative.  Head:  Normocephalic and atraumatic. Eyes:  Conjuctiva clear without scleral icterus. Heart:  S1, S2 present without murmurs appreciated. Lungs:  Clear to auscultation bilaterally. No wheezes, rales, or rhonchi. No distress.  Abdomen:  +BS, soft, and non-distended. Moderate TTP in epigastric area. No rebound or guarding. No HSM or masses noted. Msk:  Symmetrical without gross deformities. Normal posture. Extremities:  Without edema. Neurologic:  Alert and  oriented x4 Psych:  Normal mood and affect.    Assessment:  68 y.o. female  with a history of partial SBO in 2020 managed conservatively, constipation, GERD with esophagitis, dysphagia with esophageal stenosis and Schatzki's ring, chronic abdominal pain, COPD, DVT, HLD, HTN, and adenomatous colon polyps, presenting today with complaint of dysphagia, food regurgitation, and epigastric pain.   Patient with acute onset of symptoms yesterday with persistent symptoms today.  States food gets stuck in her lower esophagus and comes back up within 5 minutes of eating.  She is able to keep some liquids down.  Denies any associated nausea with this.  She is having intermittent epigastric pain that seems to worsen with p.o. intake as well.  Denies heartburn, and not taking pantoprazole  for the last 2 months.  Denies NSAIDs until she was prescribed naproxen  on 7/8 for back pain  which she has been taking once a day.   Query recurrent esophageal stenosis versus Schatzki's ring in the absence of PPI.  Could also have gastritis, duodenitis, PUD contributing to her epigastric pain.  Less likely biliary etiology in the setting of prior cholecystectomy.  Symptoms are not consistent with SBO as she has continued to have normal bowel movements and has a soft abdomen on exam today with normal bowel sounds.   Plan:  CBC, CMP, lipase Proceed with upper endoscopy with propofol  by Dr. Cindie. The risks, benefits, and alternatives have been discussed with the patient in detail. The patient states understanding and desires to proceed.  ASA 3, but ok to schedule in endo room 1 or 2 Resume pantoprazole  40 mg twice daily. Stop Naproxen .  OK to use tylenol  for pain.  Recommended soft/full liquid diet for now. Advised if she was not able to at least tolerate a soft/liquid diet, she would need to proceed to the emergency room for expedited workup. Follow-up after EGD.   [place] Centers, PA-C [facility] 07/02/2024

## 2024-07-02 NOTE — Telephone Encounter (Signed)
 [employer] PA: Approved Authorization #[id number]  Tracking #[id number] Dates of service 07/04/2024 - 10/03/2024

## 2024-07-02 NOTE — Patient Instructions (Signed)
 Please have blood work completed at [facility].  We will get you scheduled for an upper endoscopy with possible stretching of your esophagus in the near future with Dr. Cindie.  Resume taking pantoprazole  40 mg twice daily 30 minutes before breakfast and dinner.  Follow a soft diet for now consuming foods similar to mashed potato, pudding, yogurt consistency.  Also recommend that you drink a couple of protein shakes to help maintain adequate nutrition.  If you get to the point that you are not able to tolerate soft/liquid diet, you will need to proceed to the emergency room for expedited workup.  I will see back in the office after your procedure.  Josette Centers, PA-C [facility]

## 2024-07-03 ENCOUNTER — Ambulatory Visit: Payer: Self-pay | Admitting: Gastroenterology

## 2024-07-04 ENCOUNTER — Other Ambulatory Visit: Payer: Self-pay

## 2024-07-04 ENCOUNTER — Ambulatory Visit (HOSPITAL_COMMUNITY): Admitting: Anesthesiology

## 2024-07-04 ENCOUNTER — Ambulatory Visit (HOSPITAL_COMMUNITY)
Admission: RE | Admit: 2024-07-04 | Discharge: 2024-07-04 | Disposition: A | Discharge status: Home / Self Care | Attending: Internal Medicine | Admitting: Internal Medicine

## 2024-07-04 ENCOUNTER — Encounter (HOSPITAL_COMMUNITY): Payer: Self-pay | Admitting: Internal Medicine

## 2024-07-04 ENCOUNTER — Encounter (HOSPITAL_COMMUNITY)
Admission: RE | Disposition: A | Discharge status: Home / Self Care | Payer: Self-pay | Source: Home / Self Care | Attending: Internal Medicine

## 2024-07-04 DIAGNOSIS — I10 Essential (primary) hypertension: Secondary | ICD-10-CM

## 2024-07-04 DIAGNOSIS — K449 Diaphragmatic hernia without obstruction or gangrene: Secondary | ICD-10-CM

## 2024-07-04 DIAGNOSIS — F17200 Nicotine dependence, unspecified, uncomplicated: Secondary | ICD-10-CM | POA: Diagnosis not present

## 2024-07-04 DIAGNOSIS — K2289 Other specified disease of esophagus: Secondary | ICD-10-CM | POA: Diagnosis not present

## 2024-07-04 DIAGNOSIS — R1310 Dysphagia, unspecified: Secondary | ICD-10-CM | POA: Diagnosis not present

## 2024-07-04 DIAGNOSIS — R1013 Epigastric pain: Secondary | ICD-10-CM | POA: Diagnosis not present

## 2024-07-04 DIAGNOSIS — J449 Chronic obstructive pulmonary disease, unspecified: Secondary | ICD-10-CM | POA: Diagnosis not present

## 2024-07-04 DIAGNOSIS — K3189 Other diseases of stomach and duodenum: Secondary | ICD-10-CM | POA: Diagnosis not present

## 2024-07-04 DIAGNOSIS — M1990 Unspecified osteoarthritis, unspecified site: Secondary | ICD-10-CM | POA: Diagnosis not present

## 2024-07-04 DIAGNOSIS — K2970 Gastritis, unspecified, without bleeding: Secondary | ICD-10-CM | POA: Diagnosis not present

## 2024-07-04 DIAGNOSIS — F17210 Nicotine dependence, cigarettes, uncomplicated: Secondary | ICD-10-CM | POA: Diagnosis not present

## 2024-07-04 DIAGNOSIS — I739 Peripheral vascular disease, unspecified: Secondary | ICD-10-CM | POA: Insufficient documentation

## 2024-07-04 DIAGNOSIS — Z9049 Acquired absence of other specified parts of digestive tract: Secondary | ICD-10-CM | POA: Diagnosis not present

## 2024-07-04 DIAGNOSIS — K2950 Unspecified chronic gastritis without bleeding: Secondary | ICD-10-CM | POA: Diagnosis not present

## 2024-07-04 DIAGNOSIS — K219 Gastro-esophageal reflux disease without esophagitis: Secondary | ICD-10-CM | POA: Diagnosis not present

## 2024-07-04 HISTORY — PX: ESOPHAGOGASTRODUODENOSCOPY: SHX5428

## 2024-07-04 LAB — KOH PREP: KOH Prep: NONE SEEN

## 2024-07-04 SURGERY — EGD (ESOPHAGOGASTRODUODENOSCOPY)
Anesthesia: General

## 2024-07-04 MED ORDER — LACTATED RINGERS IV SOLN
INTRAVENOUS | Status: DC | PRN
Start: 2024-07-04 — End: 2024-07-04

## 2024-07-04 MED ORDER — LIDOCAINE 2% (20 MG/ML) 5 ML SYRINGE
INTRAMUSCULAR | Status: DC | PRN
Start: 2024-07-04 — End: 2024-07-04
  Administered 2024-07-04: 60 mg via INTRAVENOUS

## 2024-07-04 MED ORDER — PROPOFOL 10 MG/ML IV BOLUS
INTRAVENOUS | Status: DC | PRN
  Administered 2024-07-04: 20 mg via INTRAVENOUS
  Administered 2024-07-04: 80 mg via INTRAVENOUS

## 2024-07-04 MED ORDER — LACTATED RINGERS IV SOLN: INTRAVENOUS | Status: DC

## 2024-07-04 NOTE — Op Note (Signed)
 [facility] Patient Name: Virginia Washington Procedure Date: 07/04/2024 1:31 PM MRN: [id number] Date of Birth: [birth year] Attending MD: Carlin POUR. Cindie , [place], [id number] CSN: [id number] Age: 68 Admit Type: Outpatient Procedure:                Upper GI endoscopy Indications:              Epigastric abdominal pain, Dysphagia Providers:                Carlin POUR. Cindie, DO, Ashley Goins, Angela A.                            Gerome RN, RN Referring MD:              Medicines:                See the Anesthesia note for documentation of the                            administered medications Complications:            No immediate complications. Estimated Blood Loss:     Estimated blood loss was minimal. Procedure:                Pre-Anesthesia Assessment:                           - The anesthesia plan was to use monitored                            anesthesia care (MAC).                           After obtaining informed consent, the endoscope was                            passed under direct vision. Throughout the                            procedure, the patient's blood pressure, pulse, and                            oxygen  saturations were monitored continuously. The                            [id number]-H190 ([id number]) scope was introduced through the                            mouth, and advanced to the second part of duodenum.                            The upper GI endoscopy was accomplished without                            difficulty. The patient tolerated the procedure                            well. Scope  In: 1:36:50 PM Scope Out: 1:42:23 PM Total Procedure Duration: 0 hours 5 minutes 33 seconds  Findings:      White nummular lesions were noted in the lower third of the esophagus.       Cells for cytology were obtained by brushing.      A small hiatal hernia was present.      Biopsies were taken with a cold forceps in the middle third of the       esophagus for histology.       Patchy mild inflammation characterized by erythema was found in the       gastric body and in the gastric antrum. Biopsies were taken with a cold       forceps for Helicobacter pylori testing.      The duodenal bulb, first portion of the duodenum and second portion of       the duodenum were normal. Impression:               - White nummular lesions in esophageal mucosa.                            Cells for cytology obtained.                           - Small hiatal hernia.                           - Gastritis. Biopsied.                           - Normal duodenal bulb, first portion of the                            duodenum and second portion of the duodenum.                           - Biopsies were taken with a cold forceps for                            histology in the middle third of the esophagus. Moderate Sedation:      Per Anesthesia Care Recommendation:           - Patient has a contact number available for                            emergencies. The signs and symptoms of potential                            delayed complications were discussed with the                            patient. Return to normal activities tomorrow.                            Written discharge instructions were provided to the                            patient.                           -  Resume previous diet.                           - Continue present medications.                           - Await pathology results.                           - Return to GI clinic in 6 weeks. Procedure Code(s):        --- Professional ---                           [id number], Esophagogastroduodenoscopy, flexible,                            transoral; with biopsy, single or multiple Diagnosis Code(s):        --- Professional ---                           K22.89, Other specified disease of esophagus                           K44.9, Diaphragmatic hernia without obstruction or                            gangrene                            K29.70, Gastritis, unspecified, without bleeding                           R10.13, Epigastric pain                           R13.10, Dysphagia, unspecified CPT copyright 2022 American Medical Association. All rights reserved. The codes documented in this report are preliminary and upon coder review may  be revised to meet current compliance requirements. Carlin POUR. Cindie, DO Carlin POUR. Cindie, DO 07/04/2024 1:47:48 PM This report has been signed electronically. Number of Addenda: 0

## 2024-07-04 NOTE — Progress Notes (Signed)
 Patient reported abdominal pain 10/10 tightness when she was waking up in post op. Patients abdomen was soft to touch. Patient says she has had this pain before. Dr. Cindie was notified and talked and examined  patient in post op area. Patient was helped to move to chair and then walked in the hall to try and help relieve some of the discomfort and tightness she was having. After walking patient stated the abdominal tightness was better rating it a 6/10. Patient was ready to leave at this time saying I will be okay, I will go home and lay down. Patient was offered to stay longer if she needed to. Patient was then walked to car and was instructed if her pain did not continue to get better to come back to the emergency department.

## 2024-07-04 NOTE — Interval H&P Note (Signed)
 History and Physical Interval Note:  07/04/2024 1:29 PM  Virginia Washington  has presented today for surgery, with the diagnosis of dysphagia,epigastric pain, food regurgitation.  The various methods of treatment have been discussed with the patient and family. After consideration of risks, benefits and other options for treatment, the patient has consented to  Procedure(s) with comments: EGD (ESOPHAGOGASTRODUODENOSCOPY) (N/A) - 2:00 pm, ok rm 1-2 DILATION, ESOPHAGUS (N/A) as a surgical intervention.  The patient's history has been reviewed, patient examined, no change in status, stable for surgery.  I have reviewed the patient's chart and labs.  Questions were answered to the patient's satisfaction.     Carlin MARLA Hasty

## 2024-07-04 NOTE — Anesthesia Postprocedure Evaluation (Signed)
 Anesthesia Post Note  Patient: [name]  Procedure(s) Performed: EGD (ESOPHAGOGASTRODUODENOSCOPY)  Patient location during evaluation: Endoscopy Anesthesia Type: General Level of consciousness: awake and alert Pain management: pain level controlled Vital Signs Assessment: post-procedure vital signs reviewed and stable Respiratory status: spontaneous breathing, nonlabored ventilation and respiratory function stable Cardiovascular status: blood pressure returned to baseline and stable Postop Assessment: no apparent nausea or vomiting Anesthetic complications: no   There were no known notable events for this encounter.   Last Vitals:  Vitals:   07/04/24 1344 07/04/24 1347  BP: (!) 123/54 117/62  Pulse: 62 61  Resp: (!) 22 (!) 22  Temp: 36.8 C   SpO2: 96% 100%    Last Pain:  Vitals:   07/04/24 1410  TempSrc:   PainSc: 6                  [name] [name]

## 2024-07-04 NOTE — Transfer of Care (Signed)
 Immediate Anesthesia Transfer of Care Note  Patient: Virginia Washington  Procedure(s) Performed: EGD (ESOPHAGOGASTRODUODENOSCOPY) DILATION, ESOPHAGUS  Patient Location: Endoscopy Unit  Anesthesia Type:General  Level of Consciousness: drowsy  Airway & Oxygen  Therapy: Patient Spontanous Breathing  Post-op Assessment: Report given to RN and Post -op Vital signs reviewed and stable  Post vital signs: Reviewed and stable  Last Vitals:  Vitals Value Taken Time  BP 123/54 07/04/24 13:44  Temp 36.8 C 07/04/24 13:44  Pulse 62 07/04/24 13:44  Resp 22 07/04/24 13:44  SpO2 96 % 07/04/24 13:44    Last Pain:  Vitals:   07/04/24 1344  TempSrc: Oral  PainSc:          Complications: No notable events documented.

## 2024-07-04 NOTE — Anesthesia Preprocedure Evaluation (Addendum)
 Anesthesia Evaluation  Patient identified by MRN, date of birth, ID band Patient awake    Reviewed: Allergy & Precautions, H&P , NPO status , Patient's Chart, lab work & pertinent test results, reviewed documented beta blocker date and time   Airway Mallampati: II  TM Distance: >3 FB Neck ROM: full    Dental  (+) Edentulous Lower, Edentulous Upper   Pulmonary COPD, Current Smoker and Patient abstained from smoking.   Pulmonary exam normal breath sounds clear to auscultation       Cardiovascular Exercise Tolerance: Good hypertension, + Peripheral Vascular Disease  Normal cardiovascular exam Rhythm:regular Rate:Normal     Neuro/Psych  Headaches  Neuromuscular disease  negative psych ROS   GI/Hepatic Neg liver ROS,GERD  Medicated,,  Endo/Other  negative endocrine ROS    Renal/GU negative Renal ROS  negative genitourinary   Musculoskeletal  (+) Arthritis , Osteoarthritis,    Abdominal   Peds  Hematology negative hematology ROS (+)   Anesthesia Other Findings DVT  Reproductive/Obstetrics negative OB ROS                              Anesthesia Physical Anesthesia Plan  ASA: 3  Anesthesia Plan: General   Post-op Pain Management: Minimal or no pain anticipated   Induction:   PONV Risk Score and Plan: Propofol  infusion  Airway Management Planned: Nasal Cannula and Natural Airway  Additional Equipment: None  Intra-op Plan:   Post-operative Plan:   Informed Consent: I have reviewed the patients History and Physical, chart, labs and discussed the procedure including the risks, benefits and alternatives for the proposed anesthesia with the patient or authorized representative who has indicated his/her understanding and acceptance.     Dental Advisory Given  Plan Discussed with: CRNA  Anesthesia Plan Comments:          Anesthesia Quick Evaluation

## 2024-07-04 NOTE — Discharge Instructions (Addendum)
 EGD Discharge instructions Please read the instructions outlined below and refer to this sheet in the next few weeks. These discharge instructions provide you with general information on caring for yourself after you leave the hospital. Your doctor may also give you specific instructions. While your treatment has been planned according to the most current medical practices available, unavoidable complications occasionally occur. If you have any problems or questions after discharge, please call your doctor. ACTIVITY You may resume your regular activity but move at a slower pace for the next 24 hours.  Take frequent rest periods for the next 24 hours.  Walking will help expel (get rid of) the air and reduce the bloated feeling in your abdomen.  No driving for 24 hours (because of the anesthesia (medicine) used during the test).  You may shower.  Do not sign any important legal documents or operate any machinery for 24 hours (because of the anesthesia used during the test).  NUTRITION Drink plenty of fluids.  You may resume your normal diet.  Begin with a light meal and progress to your normal diet.  Avoid alcoholic beverages for 24 hours or as instructed by your caregiver.  MEDICATIONS You may resume your normal medications unless your caregiver tells you otherwise.  WHAT YOU CAN EXPECT TODAY You may experience abdominal discomfort such as a feeling of fullness or "gas" pains.  FOLLOW-UP Your doctor will discuss the results of your test with you.  SEEK IMMEDIATE MEDICAL ATTENTION IF ANY OF THE FOLLOWING OCCUR: Excessive nausea (feeling sick to your stomach) and/or vomiting.  Severe abdominal pain and distention (swelling).  Trouble swallowing.  Temperature over 101 F (37.8 C).  Rectal bleeding or vomiting of blood.   Your EGD revealed mild amount inflammation in your stomach.  I took biopsies of this to rule out infection with a bacteria called H. pylori.  I also took samples of your  esophagus to rule out a potential yeast infection.  If these are positive then I will send in a 14-day course of Diflucan.  Await pathology results, my office will contact you.  Continue on pantoprazole  twice daily.  Follow-up in GI office in 4 to 6 weeks.    I hope you have a great rest of your week!  Carlin POUR. Cindie, D.O. Gastroenterology and Hepatology [facility]

## 2024-07-07 ENCOUNTER — Encounter (HOSPITAL_COMMUNITY): Payer: Self-pay | Admitting: Internal Medicine

## 2024-07-07 ENCOUNTER — Telehealth: Payer: Self-pay | Admitting: Gastroenterology

## 2024-07-07 LAB — SURGICAL PATHOLOGY

## 2024-07-07 NOTE — Telephone Encounter (Signed)
 I called pt to schedule her follow up appointment and she said that her stomach was still hurting.  She had her procedure on Friday, 7/18 with Dr. Cindie.  She said she is having no other symptoms.  She said her stomach hurts at the top every now and again.

## 2024-07-08 NOTE — Telephone Encounter (Signed)
 Spoke to pt, she informed me her stomach hurts when she eats. She stated she has not been taking her pantoprazole . I informed her she is to be taking pantoprazole  twice a day. To call back next week with an update on how her stomach is doing.

## 2024-07-08 NOTE — Telephone Encounter (Signed)
 Noted

## 2024-07-08 NOTE — Telephone Encounter (Signed)
 Agree with recommendations. She needs to be taking pantoprazole  twice a day. It may take a few weeks for her abdominal pain to really start to resolve. Keep upcoming appointment as scheduled.

## 2024-07-16 ENCOUNTER — Other Ambulatory Visit: Payer: Self-pay | Admitting: Gastroenterology

## 2024-07-16 DIAGNOSIS — K581 Irritable bowel syndrome with constipation: Secondary | ICD-10-CM

## 2024-07-18 ENCOUNTER — Ambulatory Visit: Payer: Self-pay | Admitting: Internal Medicine

## 2024-07-31 DIAGNOSIS — I10 Essential (primary) hypertension: Secondary | ICD-10-CM | POA: Diagnosis not present

## 2024-07-31 DIAGNOSIS — J440 Chronic obstructive pulmonary disease with acute lower respiratory infection: Secondary | ICD-10-CM | POA: Diagnosis not present

## 2024-08-02 ENCOUNTER — Emergency Department (HOSPITAL_COMMUNITY)

## 2024-08-02 ENCOUNTER — Emergency Department (HOSPITAL_COMMUNITY)
Admission: EM | Admit: 2024-08-02 | Discharge: 2024-08-02 | Disposition: A | Discharge status: Home / Self Care | Attending: Emergency Medicine | Admitting: Emergency Medicine

## 2024-08-02 ENCOUNTER — Other Ambulatory Visit: Payer: Self-pay

## 2024-08-02 ENCOUNTER — Encounter (HOSPITAL_COMMUNITY): Payer: Self-pay | Admitting: Emergency Medicine

## 2024-08-02 DIAGNOSIS — M5441 Lumbago with sciatica, right side: Secondary | ICD-10-CM | POA: Insufficient documentation

## 2024-08-02 DIAGNOSIS — M25551 Pain in right hip: Secondary | ICD-10-CM | POA: Diagnosis not present

## 2024-08-02 DIAGNOSIS — Z79899 Other long term (current) drug therapy: Secondary | ICD-10-CM | POA: Insufficient documentation

## 2024-08-02 DIAGNOSIS — I10 Essential (primary) hypertension: Secondary | ICD-10-CM | POA: Insufficient documentation

## 2024-08-02 DIAGNOSIS — R202 Paresthesia of skin: Secondary | ICD-10-CM | POA: Diagnosis not present

## 2024-08-02 DIAGNOSIS — M160 Bilateral primary osteoarthritis of hip: Secondary | ICD-10-CM | POA: Diagnosis not present

## 2024-08-02 DIAGNOSIS — M545 Low back pain, unspecified: Secondary | ICD-10-CM | POA: Diagnosis present

## 2024-08-02 MED ORDER — ACETAMINOPHEN 500 MG PO TABS
1000.0000 mg | ORAL_TABLET | Freq: Once | ORAL | Status: AC
  Administered 2024-08-02: 1000 mg via ORAL
  Filled 2024-08-02: qty 2

## 2024-08-02 MED ORDER — ACETAMINOPHEN 500 MG PO TABS: 1000.0000 mg | ORAL_TABLET | Freq: Four times a day (QID) | ORAL | 0 refills | Status: AC | PRN

## 2024-08-02 MED ORDER — KETOROLAC TROMETHAMINE 15 MG/ML IJ SOLN
15.0000 mg | Freq: Once | INTRAMUSCULAR | Status: AC
  Administered 2024-08-02: 15 mg via INTRAVENOUS
  Filled 2024-08-02: qty 1

## 2024-08-02 MED ORDER — PREDNISONE 50 MG PO TABS
50.0000 mg | ORAL_TABLET | Freq: Every day | ORAL | 0 refills | Status: AC
Start: 2024-08-02 — End: 2024-08-07

## 2024-08-02 MED ORDER — LIDOCAINE 5 % EX PTCH: 1.0000 | MEDICATED_PATCH | CUTANEOUS | 0 refills | Status: AC

## 2024-08-02 MED ORDER — LIDOCAINE 5 % EX PTCH
1.0000 | MEDICATED_PATCH | CUTANEOUS | Status: DC
  Administered 2024-08-02: 1 via TRANSDERMAL
  Filled 2024-08-02: qty 1

## 2024-08-02 NOTE — ED Triage Notes (Signed)
 Pt via POV c/o severe 9/10 right hip pain since last night, spontaneous onset. Pt is ambulatory with obvious limp. No known injury.

## 2024-08-02 NOTE — ED Provider Notes (Signed)
 [place] EMERGENCY DEPARTMENT AT [address] [facility] Provider Note   CSN: [id number] Arrival date & time: 08/02/24  9098     Patient presents with: Hip Pain   [place]ia Washington is a 68 y.o. female.  3 of hypertension, IBS, high cholesterol, PAD with left iliac occlusion.  Presents ER with right low back pain radiating down the posterior lateral aspect of right thigh to the level of her calf.  Started yesterday while sitting at rest.  She had no injury or trauma, denies heavy lifting.  Pain is worse with walking and with bending over.  She reports some tingling in her low back but no saddle anesthesia or paresthesia, no bowel or bladder incontinence, no fever or chills or weight loss.  She is not diabetic and has no history of cancer.  She did not take any medication for pain at home    Hip Pain       Prior to Admission medications   Medication Sig Start Date End Date Taking? Authorizing Provider  acetaminophen  (TYLENOL ) 500 MG tablet Take 2 tablets (1,000 mg total) by mouth every 6 (six) hours as needed. 08/02/24  Yes Francee Setzer A, PA-C  lidocaine  (LIDODERM ) 5 % Place 1 patch onto the skin daily. Remove & Discard patch within 12 hours or as directed by MD 08/02/24  Yes Suellen, Montgomery Favor A, PA-C  predniSONE  (DELTASONE ) 50 MG tablet Take 1 tablet (50 mg total) by mouth daily with breakfast for 5 days. 08/02/24 08/07/24 Yes Aidan Moten A, PA-C  albuterol  (VENTOLIN  HFA) 108 (90 Base) MCG/ACT inhaler Inhale 2 puffs into the lungs every 6 (six) hours as needed for wheezing or shortness of breath. 09/25/23   Odell Balls, PA-C  alum & mag hydroxide-simeth (MAALOX MAX) 400-400-40 MG/5ML suspension Take 15 mLs by mouth every 6 (six) hours as needed for indigestion. 06/23/22   Kommor, Madison, MD  amLODipine  (NORVASC ) 5 MG tablet Take 5 mg by mouth daily.    [provider]  atorvastatin  (LIPITOR) 20 MG tablet Take 20 mg by mouth daily. 10/03/22   [provider]   gabapentin (NEURONTIN) 100 MG capsule  12/13/22   [provider]  hydrochlorothiazide (HYDRODIURIL) 12.5 MG tablet Take 12.5 mg by mouth daily. 03/02/22   [provider]  HYDROcodone -acetaminophen  (NORCO/VICODIN) 5-325 MG tablet Take 1-2 tablets by mouth every 6 (six) hours as needed. 06/25/24   Geroldine Berg, MD  hydrocortisone  (ANUSOL -HC) 2.5 % rectal cream Place 1 Application rectally 2 (two) times daily. 12/19/23   Cleotilde Rogue, MD  hydrocortisone  (ANUSOL -HC) 25 MG suppository Place 1 suppository (25 mg total) rectally 2 (two) times daily. 12/19/23   Cleotilde Rogue, MD  ipratropium-albuterol  (DUONEB) 0.5-2.5 (3) MG/3ML SOLN SMARTSIG:1 Vial(s) Via Nebulizer Every 4-6 Hours PRN 09/08/22   [provider]  linaclotide  (LINZESS ) 290 MCG CAPS capsule TAKE 1 CAPSULE(290 MCG) BY MOUTH DAILY BEFORE BREAKFAST 07/16/24   Rudy Josette RAMAN, PA-C  naproxen  (NAPROSYN ) 375 MG tablet Take 1 tablet (375 mg total) by mouth 2 (two) times daily. 06/25/24   Geroldine Berg, MD  ondansetron  (ZOFRAN ) 4 MG tablet Take 1 tablet (4 mg total) by mouth every 8 (eight) hours as needed for nausea or vomiting. 11/30/23   Mesner, Selinda, MD  ondansetron  (ZOFRAN ) 4 MG tablet Take 1 tablet (4 mg total) by mouth every 8 (eight) hours as needed for nausea or vomiting. 11/30/23   Mesner, Selinda, MD  pantoprazole  (PROTONIX ) 40 MG tablet Take 1 tablet (40 mg total) by  mouth 2 (two) times daily. 07/02/24   Rudy Josette RAMAN, PA-C  simethicone  (MYLICON) 125 MG chewable tablet Chew 1 tablet after each meal and at bedtime if needed Patient not taking: Reported on 07/02/2024 01/12/22   Cindie Carlin POUR, DO  TRELEGY ELLIPTA 100-62.5-25 MCG/ACT AEPB  12/13/22   [provider]    Allergies: Patient has no known allergies.    Review of Systems  Updated Vital Signs BP 124/68   Pulse (!) 55   Temp 98 F (36.7 C) (Oral)   Resp 18   Ht 5' 2 (1.575 m)   Wt 61.2 kg   SpO2 96%   BMI 24.69 kg/m   Physical  Exam Vitals and nursing note reviewed.  Constitutional:      General: She is not in acute distress.    Appearance: She is well-developed.  HENT:     Head: Normocephalic and atraumatic.     Mouth/Throat:     Mouth: Mucous membranes are moist.  Eyes:     Extraocular Movements: Extraocular movements intact.     Conjunctiva/sclera: Conjunctivae normal.     Pupils: Pupils are equal, round, and reactive to light.  Cardiovascular:     Rate and Rhythm: Normal rate and regular rhythm.     Heart sounds: No murmur heard. Pulmonary:     Effort: Pulmonary effort is normal. No respiratory distress.     Breath sounds: Normal breath sounds.  Abdominal:     Palpations: Abdomen is soft.     Tenderness: There is no abdominal tenderness.  Musculoskeletal:        General: No swelling.     Cervical back: Neck supple.     Comments: Tenderness to right lumbar and right SI joint.  Positive straight leg raise.  No midline lumbar tenderness.  Normal sensation bilateral lower extremities, normal strength in bilateral lower extremities.  DP and PT pulses intact in the right foot.  Skin:    General: Skin is warm and dry.     Capillary Refill: Capillary refill takes less than 2 seconds.  Neurological:     General: No focal deficit present.     Mental Status: She is alert and oriented to person, place, and time.  Psychiatric:        Mood and Affect: Mood normal.     (all labs ordered are listed, but only abnormal results are displayed) Labs Reviewed - No data to display  EKG: None  Radiology: DG Hip Unilat  With Pelvis 2-3 Views Right Result Date: 08/02/2024 EXAM: 2 or 3 VIEW(S) XRAY OF THE RIGHT HIP 08/02/2024 09:53:51 AM COMPARISON: None available. CLINICAL HISTORY: Pain. Per Triage: Pt via POV c/o severe 9/10 right hip pain since last night, spontaneous onset. Pt is ambulatory with obvious limp. No known injury. FINDINGS: BONES AND JOINTS: No acute fracture or focal osseous lesion. Mild bilateral hip  osteoarthritis. Surgical clips noted in the projection of the left hemipelvis. SOFT TISSUES: The soft tissues are unremarkable. IMPRESSION: 1. No acute findings. 2. Mild bilateral hip osteoarthritis. Electronically signed by: Waddell Calk MD 08/02/2024 10:02 AM EDT RP Workstation: [id number]     Procedures   Medications Ordered in the ED  ketorolac  (TORADOL ) 15 MG/ML injection 15 mg (15 mg Intravenous Given 08/02/24 1043)  acetaminophen  (TYLENOL ) tablet 1,000 mg (1,000 mg Oral Given 08/02/24 1045)  Medical Decision Making This patient presents to the ED for concern of back pain this involves an extensive number of treatment options, and is a complaint that carries with it a high risk of complications and morbidity.  The differential diagnosis includes sprain, strain, HNP, fracture, DDD, muscle spasm, cauda equina, epidural abscess or hematoma, malignancy, other   Co morbidities that complicate the patient evaluation  Hypertension, PAD   Additional history obtained:  Additional history obtained from EMR External records from outside source obtained and reviewed including previous notes and labs     Imaging Studies ordered:  I ordered imaging studies including a right hip I independently visualized and interpreted imaging which showed  Changes of bilateral hips no acute abnormality I agree with the radiologist interpretation      Problem List / ED Course / Critical interventions / Medication management  Patient presents with low back pain . Differential considered as above. Symptoms are likely due to muscle strain. They have no high risk features including saddle anesthesia/paresthesia, bowel or bladder incontinence, urinary retention, fever, weight loss, history of cancer or immune suppression, or IV drug use. We discussed plan to treat symptoms with analgesics, and follow up with PCP. They were advised on strict return precautions.  I  ordered medication including tylenol  and toradol   for pain  Reevaluation of the patient after these medicines showed that the patient improved I have reviewed the patients home medicines and have made adjustments as needed     Amount and/or Complexity of Data Reviewed Radiology: ordered.  Risk OTC drugs. Prescription drug management.        Final diagnoses:  Acute right-sided low back pain with right-sided sciatica    ED Discharge Orders          Ordered    acetaminophen  (TYLENOL ) 500 MG tablet  Every 6 hours PRN        08/02/24 1109    predniSONE  (DELTASONE ) 50 MG tablet  Daily with breakfast        08/02/24 1109    lidocaine  (LIDODERM ) 5 %  Every 24 hours        08/02/24 [address] 08/02/24 1830    Cleotilde Rogue, MD 08/03/24 [phone]

## 2024-08-02 NOTE — ED Notes (Signed)
 X-ray at bedside.

## 2024-08-02 NOTE — Discharge Instructions (Signed)
 It was a pleasure to take care of you today.  You were seen for low back pain radiating down your right leg.  Your history and exam are consistent with pain from a pinched nerve called radiculopathy.  I am prescribing Tylenol  for pain as well as steroids and lidocaine  patches.  Follow-up closely with your PCP.  Come back to the ER if you have new or worsening symptoms.

## 2024-08-12 NOTE — Progress Notes (Deleted)
 Referring Provider: Carlette Benita Area* Primary Care Physician:  Carlette Benita Area, MD Primary GI Physician: Dr. Cindie  No chief complaint on file.   HPI:   Virginia Washington is a 68 y.o. female with a history of partial SBO in 2020 managed conservatively, constipation, GERD with esophagitis, dysphagia with esophageal stenosis and Schatzki's ring, chronic abdominal pain, COPD, DVT, HLD, HTN, and adenomatous colon polyps, presenting today for follow-up of dysphagia.  Last seen in the office 07/02/2024 reporting acute onset dysphagia, food regurgitation, epigastric pain x 1 day with persistent symptoms.  Reported regurgitation of items within 5 minutes of eating.  She was able to keep liquids down.  Had not been taking pantoprazole  for the last 2 months.  Denied heartburn.  Denied NSAIDs until she was prescribed naproxen  on 7/8 for back pain which she had been taking once a day.  Plan to update labs, EGD, resume pantoprazole  40 mg twice daily, stop naproxen .  CBC, CMP, lipase without acute abnormalities.  EGD 07/05/2024:  - White nummular lesions in esophageal mucosa. Cells for cytology obtained.  - Small hiatal hernia.  - Gastritis. Biopsied.  - Normal duodenal bulb, first portion of the duodenum and second portion of the duodenum.  - Biopsies were taken with a cold forceps for histology in the middle third of the esophagus. - Pathology showed reactive gastropathy with minimal chronic gastritis, no H. pylori.  Esophageal biopsy suggestive of reflux esophagitis.  KOH prep was negative. - Recommended continuing PPI twice daily and NSAID avoidance.   Today:      Prior GI procedures: EGD 07/17/22: -Grade B reflux esophagitis s/p biopsy -Small hiatal hernia -Mild Schatzki's ring s/p dilation -Normal stomach and duodenum -Pathology with esophageal squamous and cardiac mucosa, no evidence of dysplasia or metaplasia -PPI twice daily recommended and repeat EGD with rebiopsy  in 3 months   EGD 10/12/23: -Small hiatal hernia -Z-line irregular, s/p biopsy -LA grade a reflux esophagitis without bleeding -Gastritis s/p biopsy -Normal duodenum -Pathology: Reactive gastropathy, negative H. pylori.  Esophageal mucosa with mild chronic nonspecific carditis, negative for metaplasia or dysplasia. -Repeat EGD as needed for retreatment.  Continue BID PPI.    BPE 12/24/23 for persistent dysphagia: -Small hiatal hernia -No GERD visualized -Normal-appearing esophagus with mild esophageal dysmotility -Barium tablet passed normally.   Colonoscopy 10/12/23: -Internal hemorrhoids -5 mm transverse colon polyp -Two 4-5 mm sigmoid polyps -Path: Tubular adenoma and hyperplastic polyps -Repeat colonoscopy in 5 years  Past Medical History:  Diagnosis Date   Chronic back pain    Constipation    Constipation    COPD (chronic obstructive pulmonary disease) (HCC)    DVT (deep venous thrombosis) (HCC)    GERD (gastroesophageal reflux disease)    Headache    History of kidney stones    Hyperlipidemia    Hypertension     Past Surgical History:  Procedure Laterality Date   ABDOMINAL AORTOGRAM W/LOWER EXTREMITY N/A 10/26/2017   Procedure: ABDOMINAL AORTOGRAM W/LOWER EXTREMITY;  Surgeon: Harvey Carlin BRAVO, MD;  Location: [facility];  Service: Cardiovascular: 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  Normal right aortoiliac vessels.  Three-vessel runoff bilaterally.  (Sluggish flow of the left side due to occlusion.   ABDOMINAL HYSTERECTOMY     BALLOON DILATION N/A 10/05/2020   Procedure: BALLOON DILATION;  Surgeon: Cindie Carlin POUR, DO;  Location: [facility];  Service: Endoscopy;  Laterality: N/A;   BALLOON DILATION N/A 07/17/2022  Procedure: BALLOON DILATION;  Surgeon: Cindie Carlin POUR, DO;  Location: [facility];  Service: Endoscopy;  Laterality: N/A;   BALLOON DILATION N/A 10/12/2023   Procedure:  BALLOON DILATION;  Surgeon: Cindie Carlin POUR, DO;  Location: [facility];  Service: Endoscopy;  Laterality: N/A;   BIOPSY  10/05/2020   Procedure: BIOPSY;  Surgeon: Cindie Carlin POUR, DO;  [facility]: [facility];  Service: Endoscopy;;   BIOPSY  07/17/2022   Procedure: BIOPSY;  Surgeon: Cindie Carlin POUR, DO;  [facility]: [facility];  Service: Endoscopy;;   BIOPSY  10/12/2023   Procedure: BIOPSY;  Surgeon: Cindie Carlin POUR, DO;  [facility]: [facility];  Service: Endoscopy;;   CHOLECYSTECTOMY     COLONOSCOPY  2007   Dr. Golda: small internal hemorrhoids, otherwise normal. exam could be compromised due to quality of prep   COLONOSCOPY N/A 02/15/2018   Dr. Harvey: 2 simple adenomas, external and internal hemorrhoids. Next colonoscopy 2024-2026   COLONOSCOPY WITH PROPOFOL  N/A 10/12/2023   Procedure: COLONOSCOPY WITH PROPOFOL ;  Surgeon: Cindie Carlin POUR, DO;  [facility]: [facility];  Service: Endoscopy;  Laterality: N/A;  10:15 am, asa 3   ESOPHAGOGASTRODUODENOSCOPY N/A 05/09/2018   Procedure: ESOPHAGOGASTRODUODENOSCOPY (EGD);  Surgeon: Harvey Margo CROME, MD; web in the distal esophagus s/p dilated, mild gastritis biopsied, normal examined duodenum. Pathology with gastritis, negative for H. Pylori.     ESOPHAGOGASTRODUODENOSCOPY N/A 07/04/2024   Procedure: EGD (ESOPHAGOGASTRODUODENOSCOPY);  Surgeon: Cindie Carlin POUR, DO;  [facility]: [facility];  Service: Endoscopy;  Laterality: N/A;  2:00 pm, ok rm 1-2   ESOPHAGOGASTRODUODENOSCOPY (EGD) WITH PROPOFOL  N/A 10/05/2020   Surgeon: Cindie Carlin POUR, DO; small hiatal hernia, grade B reflux esophagitis, benign-appearing esophageal stenosis dilated, gastritis biopsied- mild nonspecific reactive gastropathy, negative for H. pylori.   ESOPHAGOGASTRODUODENOSCOPY (EGD) WITH PROPOFOL  N/A 07/17/2022   Procedure: ESOPHAGOGASTRODUODENOSCOPY (EGD) WITH PROPOFOL ;  Surgeon: Cindie Carlin POUR, DO;  [facility];  Service: Endoscopy;  Laterality: N/A;   9:45am   ESOPHAGOGASTRODUODENOSCOPY (EGD) WITH PROPOFOL  N/A 10/12/2023   Procedure: ESOPHAGOGASTRODUODENOSCOPY (EGD) WITH PROPOFOL ;  Surgeon: Cindie Carlin POUR, DO;  [facility];  Service: Endoscopy;  Laterality: N/A;   FEMORAL-FEMORAL BYPASS GRAFT Bilateral 10/21/2018   Procedure: RIGHT TO LEFT FEMORAL ARTERY BYPASS GRAFT;  Surgeon: Harvey Carlin BRAVO, MD;  Location: [facility] OR;  Service: Vascular;  Laterality: Bilateral;   LOWER EXTREMITY ANGIOGRAPHY N/A 09/27/2018   Procedure: LOWER EXTREMITY ANGIOGRAPHY;  Surgeon: Harvey Carlin BRAVO, MD;  Location: [facility];  Service: Cardiovascular;  Laterality: N/A;   NM MYOVIEW  LTD  12/2014   [facility]: Normal Myoview .  Nonischemic.   POLYPECTOMY  10/12/2023   Procedure: POLYPECTOMY;  Surgeon: Cindie Carlin POUR, DO;  [facility]: [facility];  Service: Endoscopy;;   TRANSTHORACIC ECHOCARDIOGRAM  12/2011   EF 55 to 60%.  No regional wall motion normality.  GRII DD.  No significant valve disease.   VASCULAR SURGERY      Current Outpatient Medications  Medication Sig Dispense Refill   acetaminophen  (TYLENOL ) 500 MG tablet Take 2 tablets (1,000 mg total) by mouth every 6 (six) hours as needed. 30 tablet 0   albuterol  (VENTOLIN  HFA) 108 (90 Base) MCG/ACT inhaler Inhale 2 puffs into the lungs every 6 (six) hours as needed for wheezing or shortness of breath. 1 each 0   alum & mag hydroxide-simeth (MAALOX MAX) 400-400-40 MG/5ML suspension Take 15 mLs by mouth every 6 (six) hours as needed for indigestion. 355  mL 0   amLODipine  (NORVASC ) 5 MG tablet Take 5 mg by mouth daily.     atorvastatin  (LIPITOR) 20 MG tablet Take 20 mg by mouth daily.     gabapentin (NEURONTIN) 100 MG capsule      hydrochlorothiazide (HYDRODIURIL) 12.5 MG tablet Take 12.5 mg by mouth daily.     HYDROcodone -acetaminophen  (NORCO/VICODIN) 5-325 MG tablet Take 1-2 tablets by mouth every 6 (six) hours as needed. 15 tablet 0   hydrocortisone  (ANUSOL -HC) 2.5 % rectal  cream Place 1 Application rectally 2 (two) times daily. 30 g 0   hydrocortisone  (ANUSOL -HC) 25 MG suppository Place 1 suppository (25 mg total) rectally 2 (two) times daily. 12 suppository 0   ipratropium-albuterol  (DUONEB) 0.5-2.5 (3) MG/3ML SOLN SMARTSIG:1 Vial(s) Via Nebulizer Every 4-6 Hours PRN     lidocaine  (LIDODERM ) 5 % Place 1 patch onto the skin daily. Remove & Discard patch within 12 hours or as directed by MD 30 patch 0   linaclotide  (LINZESS ) 290 MCG CAPS capsule TAKE 1 CAPSULE(290 MCG) BY MOUTH DAILY BEFORE BREAKFAST 30 capsule 11   naproxen  (NAPROSYN ) 375 MG tablet Take 1 tablet (375 mg total) by mouth 2 (two) times daily. 20 tablet 0   ondansetron  (ZOFRAN ) 4 MG tablet Take 1 tablet (4 mg total) by mouth every 8 (eight) hours as needed for nausea or vomiting. 4 tablet 0   ondansetron  (ZOFRAN ) 4 MG tablet Take 1 tablet (4 mg total) by mouth every 8 (eight) hours as needed for nausea or vomiting. 30 tablet 0   pantoprazole  (PROTONIX ) 40 MG tablet Take 1 tablet (40 mg total) by mouth 2 (two) times daily. 60 tablet 3   simethicone  (MYLICON) 125 MG chewable tablet Chew 1 tablet after each meal and at bedtime if needed (Patient not taking: Reported on 07/02/2024) 120 tablet 5   TRELEGY ELLIPTA 100-62.5-25 MCG/ACT AEPB  (Patient not taking: Reported on 07/02/2024)     No current facility-administered medications for this visit.    Allergies as of 08/14/2024   (No Known Allergies)    Family History  Problem Relation Age of Onset   Hypertension Mother    Peripheral Artery Disease Brother    Colon cancer Neg Hx    Colon polyps Neg Hx     Social History   Socioeconomic History   Marital status: Widowed    Spouse name: Not on file   Number of children: Not on file   Years of education: Not on file   Highest education level: Not on file  Occupational History   Occupation: unemployed    Comment: Draws pension from her husband's life insurance policy  Tobacco Use   Smoking  status: Every Day    Current packs/day: 0.50    Average packs/day: 0.5 packs/day for 40.0 years (20.0 ttl pk-yrs)    Types: Cigarettes   Smokeless tobacco: Never  Vaping Use   Vaping status: Never Used  Substance and Sexual Activity   Alcohol  use: No    Alcohol /week: 0.0 standard drinks of alcohol    Drug use: No   Sexual activity: Not Currently  Other Topics Concern   Not on file  Social History Narrative   Widowed: GETS HUSBAND'S PENSION.  mother 3, grandmother of 99 with one great grandchild.  She currently lives alone.  She completed ninth grade.  Smokes a pack a day for 42 years.  Tries to walks for about 5 minutes at x7 days a week with plans to help revascularization.  HOBBIES: PUZZLE BOOKS.   Social Drivers of Corporate investment banker Strain: Not on file  Food Insecurity: Not on file  Transportation Needs: Not on file  Physical Activity: Not on file  Stress: Not on file  Social Connections: Not on file    Review of Systems: Gen: Denies fever, chills, anorexia. Denies fatigue, weakness, weight loss.  CV: Denies chest pain, palpitations, syncope, peripheral edema, and claudication. Resp: Denies dyspnea at rest, cough, wheezing, coughing up blood, and pleurisy. GI: Denies vomiting blood, jaundice, and fecal incontinence.   Denies dysphagia or odynophagia. Derm: Denies rash, itching, dry skin Psych: Denies depression, anxiety, memory loss, confusion. No homicidal or suicidal ideation.  Heme: Denies bruising, bleeding, and enlarged lymph nodes.  Physical Exam: There were no vitals taken for this visit. General:   Alert and oriented. No distress noted. Pleasant and cooperative.  Head:  Normocephalic and atraumatic. Eyes:  Conjuctiva clear without scleral icterus. Heart:  S1, S2 present without murmurs appreciated. Lungs:  Clear to auscultation bilaterally. No wheezes, rales, or rhonchi. No distress.  Abdomen:  +BS, soft, non-tender and non-distended. No rebound  or guarding. No HSM or masses noted. Msk:  Symmetrical without gross deformities. Normal posture. Extremities:  Without edema. Neurologic:  Alert and  oriented x4 Psych:  Normal mood and affect.    Assessment:     Plan:  ***   Josette Centers, PA-C [facility] 08/14/2024

## 2024-08-14 ENCOUNTER — Ambulatory Visit: Admitting: Gastroenterology

## 2024-08-15 ENCOUNTER — Encounter: Payer: Self-pay | Admitting: Gastroenterology

## 2024-08-31 DIAGNOSIS — I10 Essential (primary) hypertension: Secondary | ICD-10-CM | POA: Diagnosis not present

## 2024-08-31 DIAGNOSIS — J440 Chronic obstructive pulmonary disease with acute lower respiratory infection: Secondary | ICD-10-CM | POA: Diagnosis not present

## 2024-09-09 ENCOUNTER — Other Ambulatory Visit: Payer: Self-pay

## 2024-09-09 ENCOUNTER — Encounter (HOSPITAL_COMMUNITY): Payer: Self-pay

## 2024-09-09 ENCOUNTER — Emergency Department (HOSPITAL_COMMUNITY)
Admission: EM | Admit: 2024-09-09 | Discharge: 2024-09-09 | Disposition: A | Discharge status: Home / Self Care | Attending: Emergency Medicine | Admitting: Emergency Medicine

## 2024-09-09 ENCOUNTER — Emergency Department (HOSPITAL_COMMUNITY)

## 2024-09-09 DIAGNOSIS — I739 Peripheral vascular disease, unspecified: Secondary | ICD-10-CM | POA: Diagnosis not present

## 2024-09-09 DIAGNOSIS — R059 Cough, unspecified: Secondary | ICD-10-CM | POA: Diagnosis present

## 2024-09-09 DIAGNOSIS — I10 Essential (primary) hypertension: Secondary | ICD-10-CM | POA: Insufficient documentation

## 2024-09-09 DIAGNOSIS — R0989 Other specified symptoms and signs involving the circulatory and respiratory systems: Secondary | ICD-10-CM | POA: Diagnosis not present

## 2024-09-09 DIAGNOSIS — E785 Hyperlipidemia, unspecified: Secondary | ICD-10-CM | POA: Insufficient documentation

## 2024-09-09 DIAGNOSIS — R42 Dizziness and giddiness: Secondary | ICD-10-CM | POA: Diagnosis not present

## 2024-09-09 DIAGNOSIS — J40 Bronchitis, not specified as acute or chronic: Secondary | ICD-10-CM | POA: Insufficient documentation

## 2024-09-09 DIAGNOSIS — F17200 Nicotine dependence, unspecified, uncomplicated: Secondary | ICD-10-CM | POA: Diagnosis not present

## 2024-09-09 DIAGNOSIS — R5381 Other malaise: Secondary | ICD-10-CM | POA: Diagnosis not present

## 2024-09-09 LAB — COMPREHENSIVE METABOLIC PANEL WITH GFR
ALT: 16 U/L (ref 0–44)
AST: 18 U/L (ref 15–41)
Albumin: 3.9 g/dL (ref 3.5–5.0)
Alkaline Phosphatase: 121 U/L (ref 38–126)
Anion gap: 10 (ref 5–15)
BUN: 10 mg/dL (ref 8–23)
CO2: 24 mmol/L (ref 22–32)
Calcium: 9.1 mg/dL (ref 8.9–10.3)
Chloride: 108 mmol/L (ref 98–111)
Creatinine, Ser: 1.11 mg/dL — ABNORMAL HIGH (ref 0.44–1.00)
GFR, Estimated: 55 mL/min — ABNORMAL LOW (ref >60)
Glucose, Bld: 87 mg/dL (ref 70–99)
Potassium: 4.2 mmol/L (ref 3.5–5.1)
Sodium: 142 mmol/L (ref 135–145)
Total Bilirubin: 0.7 mg/dL (ref 0.0–1.2)
Total Protein: 7.5 g/dL (ref 6.5–8.1)

## 2024-09-09 LAB — RESP PANEL BY RT-PCR (RSV, FLU A&B, COVID)  RVPGX2
Influenza A by PCR: NEGATIVE
Influenza B by PCR: NEGATIVE
Resp Syncytial Virus by PCR: NEGATIVE
SARS Coronavirus 2 by RT PCR: NEGATIVE

## 2024-09-09 LAB — CBC WITH DIFFERENTIAL/PLATELET
Abs Immature Granulocytes: 0.03 K/uL (ref 0.00–0.07)
Basophils Absolute: 0.1 K/uL (ref 0.0–0.1)
Basophils Relative: 1 %
Eosinophils Absolute: 0.1 K/uL (ref 0.0–0.5)
Eosinophils Relative: 1 %
HCT: 46.3 % — ABNORMAL HIGH (ref 36.0–46.0)
Hemoglobin: 14.5 g/dL (ref 12.0–15.0)
Immature Granulocytes: 0 %
Lymphocytes Relative: 31 %
Lymphs Abs: 2.3 K/uL (ref 0.7–4.0)
MCH: 27.8 pg (ref 26.0–34.0)
MCHC: 31.3 g/dL (ref 30.0–36.0)
MCV: 88.7 fL (ref 80.0–100.0)
Monocytes Absolute: 0.6 K/uL (ref 0.1–1.0)
Monocytes Relative: 8 %
Neutro Abs: 4.4 K/uL (ref 1.7–7.7)
Neutrophils Relative %: 59 %
Platelets: 247 K/uL (ref 150–400)
RBC: 5.22 MIL/uL — ABNORMAL HIGH (ref 3.87–5.11)
RDW: 14.7 % (ref 11.5–15.5)
WBC: 7.4 K/uL (ref 4.0–10.5)
nRBC: 0 % (ref 0.0–0.2)

## 2024-09-09 LAB — MAGNESIUM: Magnesium: 2.4 mg/dL (ref 1.7–2.4)

## 2024-09-09 MED ORDER — ALBUTEROL SULFATE HFA 108 (90 BASE) MCG/ACT IN AERS: 2.0000 | INHALATION_SPRAY | Freq: Four times a day (QID) | RESPIRATORY_TRACT | 2 refills | Status: AC | PRN

## 2024-09-09 MED ORDER — DOXYCYCLINE HYCLATE 100 MG PO CAPS: 100.0000 mg | ORAL_CAPSULE | Freq: Two times a day (BID) | ORAL | 0 refills | Status: AC

## 2024-09-09 NOTE — ED Triage Notes (Signed)
 Pt arrived via POV c/o generalized body aches, congestion cough and feeling dizzy X  3 days.

## 2024-09-09 NOTE — ED Provider Notes (Signed)
 [place] EMERGENCY DEPARTMENT AT [facility] Provider Note   CSN: [id number] Arrival date & time: 09/09/24  1256     Patient presents with: Dizziness   Virginia Washington is a 68 y.o. female.   Patient complains of feeling dizzy and having a cough for the past 3 days.  Patient reports that she has not been around anyone who has been sick.  Patient states that she feels like she needs to cough up phlegm but nothing is coming up.  Patient denies any fever she denies any chills.  Patient reports mild shortness of breath.  Patient is a smoker she smokes half a pack per day.  Patient reports 1 episode of vomiting no diarrhea vomiting was after coughing.Patient denies any history of lung disease.  Patient has a past medical history of peripheral artery disease GERD pulmonary nodule hypertension and hyperlipidemia  The history is provided by the patient. No language interpreter was used.  Cough Cough characteristics:  Non-productive Sputum characteristics:  Nondescript Severity:  Moderate Duration:  3 days Timing:  Constant Progression:  Worsening Chronicity:  New Context: not sick contacts   Relieved by:  Nothing Worsened by:  Nothing Ineffective treatments:  None tried Associated symptoms: no chest pain and no fever   Risk factors: no recent infection        Prior to Admission medications   Medication Sig Start Date End Date Taking? Authorizing Provider  acetaminophen  (TYLENOL ) 500 MG tablet Take 2 tablets (1,000 mg total) by mouth every 6 (six) hours as needed. 08/02/24   Suellen Cantor A, PA-C  albuterol  (VENTOLIN  HFA) 108 (90 Base) MCG/ACT inhaler Inhale 2 puffs into the lungs every 6 (six) hours as needed for wheezing or shortness of breath. 09/25/23   Odell Balls, PA-C  alum & mag hydroxide-simeth (MAALOX MAX) 400-400-40 MG/5ML suspension Take 15 mLs by mouth every 6 (six) hours as needed for indigestion. 06/23/22   Kommor, Madison, MD  amLODipine  (NORVASC ) 5 MG  tablet Take 5 mg by mouth daily.    [provider]  atorvastatin  (LIPITOR) 20 MG tablet Take 20 mg by mouth daily. 10/03/22   [provider]  gabapentin (NEURONTIN) 100 MG capsule  12/13/22   [provider]  hydrochlorothiazide (HYDRODIURIL) 12.5 MG tablet Take 12.5 mg by mouth daily. 03/02/22   [provider]  HYDROcodone -acetaminophen  (NORCO/VICODIN) 5-325 MG tablet Take 1-2 tablets by mouth every 6 (six) hours as needed. 06/25/24   Geroldine Berg, MD  hydrocortisone  (ANUSOL -HC) 2.5 % rectal cream Place 1 Application rectally 2 (two) times daily. 12/19/23   Cleotilde Rogue, MD  hydrocortisone  (ANUSOL -HC) 25 MG suppository Place 1 suppository (25 mg total) rectally 2 (two) times daily. 12/19/23   Cleotilde Rogue, MD  ipratropium-albuterol  (DUONEB) 0.5-2.5 (3) MG/3ML SOLN SMARTSIG:1 Vial(s) Via Nebulizer Every 4-6 Hours PRN 09/08/22   [provider]  lidocaine  (LIDODERM ) 5 % Place 1 patch onto the skin daily. Remove & Discard patch within 12 hours or as directed by MD 08/02/24   Suellen Cantor A, PA-C  linaclotide  (LINZESS ) 290 MCG CAPS capsule TAKE 1 CAPSULE(290 MCG) BY MOUTH DAILY BEFORE BREAKFAST 07/16/24   Rudy Josette RAMAN, PA-C  naproxen  (NAPROSYN ) 375 MG tablet Take 1 tablet (375 mg total) by mouth 2 (two) times daily. 06/25/24   Geroldine Berg, MD  ondansetron  (ZOFRAN ) 4 MG tablet Take 1 tablet (4 mg total) by mouth every 8 (eight) hours as needed for nausea or vomiting. 11/30/23   Mesner, Selinda, MD  ondansetron  (ZOFRAN ) 4 MG tablet Take 1 tablet (4 mg total) by mouth every 8 (eight) hours as needed for nausea or vomiting. 11/30/23   Mesner, Selinda, MD  pantoprazole  (PROTONIX ) 40 MG tablet Take 1 tablet (40 mg total) by mouth 2 (two) times daily. 07/02/24   Rudy Josette RAMAN, PA-C  simethicone  (MYLICON) 125 MG chewable tablet Chew 1 tablet after each meal and at bedtime if needed Patient not taking: Reported on 07/02/2024 01/12/22   Cindie Carlin POUR, DO  TRELEGY  ELLIPTA 100-62.5-25 MCG/ACT AEPB  12/13/22   [provider]    Allergies: Patient has no known allergies.    Review of Systems  Constitutional:  Negative for fever.  Respiratory:  Positive for cough.   Cardiovascular:  Negative for chest pain.  All other systems reviewed and are negative.   Updated Vital Signs BP 119/71 (BP Location: Right Arm)   Pulse 72   Temp 98.2 F (36.8 C) (Oral)   Resp 17   Ht 5' 2 (1.575 m)   Wt 61.2 kg   SpO2 98%   BMI 24.68 kg/m   Physical Exam Vitals and nursing note reviewed.  Constitutional:      Appearance: She is well-developed.  HENT:     Head: Normocephalic.  Cardiovascular:     Rate and Rhythm: Normal rate.  Pulmonary:     Effort: Pulmonary effort is normal.     Breath sounds: Rhonchi present.  Abdominal:     General: There is no distension.  Musculoskeletal:        General: Normal range of motion.  Skin:    General: Skin is warm.  Neurological:     General: No focal deficit present.     Mental Status: She is alert and oriented to person, place, and time.     (all labs ordered are listed, but only abnormal results are displayed) Labs Reviewed  CBC WITH DIFFERENTIAL/PLATELET - Abnormal; Notable for the following components:      Result Value   RBC 5.22 (*)    HCT 46.3 (*)    All other components within normal limits  COMPREHENSIVE METABOLIC PANEL WITH GFR - Abnormal; Notable for the following components:   Creatinine, Ser 1.11 (*)    GFR, Estimated 55 (*)    All other components within normal limits  RESP PANEL BY RT-PCR (RSV, FLU A&B, COVID)  [id number]  MAGNESIUM     EKG: None  Radiology: DG Chest Portable 1 View Result Date: 09/09/2024 EXAM: 1 VIEW(S) XRAY OF THE CHEST 09/09/2024 03:27:00 PM COMPARISON: 09/24/2023 CLINICAL HISTORY: Malaise. Per triage, patient arrived via POV complaining of generalized body aches, congestion, cough, and feeling dizzy for 3 days. FINDINGS: LUNGS AND PLEURA: No focal pulmonary  opacity. No pulmonary edema. No pleural effusion. No pneumothorax. HEART AND MEDIASTINUM: No acute abnormality of the cardiac and mediastinal silhouettes. BONES AND SOFT TISSUES: No acute osseous abnormality. IMPRESSION: 1. Normal chest radiograph without acute cardiopulmonary process. Electronically signed by: Waddell Calk MD 09/09/2024 04:00 PM EDT RP Workstation: [id number]     Procedures   Medications Ordered in the ED - No data to display                                  Medical Decision Making Pt complains of cough and congestion.  Pt is a smoker.    Amount and/or Complexity of Data Reviewed Labs: ordered. Decision-making details documented in ED  Course.    Details: Labs ordered reviewed and interpreted.  Flu COVID and influenza are negative. Radiology: ordered and independent interpretation performed. Decision-making details documented in ED Course.    Details: Chest x-ray shows no acute findings.  Risk Prescription drug management. Risk Details: Patient counseled on smoking cessation.   Pt given rx for doxycycline  and albuterol .  Pt advised to follow up with primary care Physician for recheck         Final diagnoses:  Bronchitis    ED Discharge Orders          Ordered    doxycycline  (VIBRAMYCIN ) 100 MG capsule  2 times daily        09/09/24 1655    albuterol  (VENTOLIN  HFA) 108 (90 Base) MCG/ACT inhaler  Every 6 hours PRN        09/09/24 1655            An After Visit Summary was printed and given to the patient.    Flint Sonny POUR, PA-C 09/09/24 1655    Elnor Savant A, DO 09/10/24 [phone]

## 2024-09-09 NOTE — Discharge Instructions (Addendum)
 Return if any problems.

## 2024-09-30 DIAGNOSIS — I10 Essential (primary) hypertension: Secondary | ICD-10-CM | POA: Diagnosis not present

## 2024-09-30 DIAGNOSIS — J440 Chronic obstructive pulmonary disease with acute lower respiratory infection: Secondary | ICD-10-CM | POA: Diagnosis not present

## 2024-10-13 ENCOUNTER — Encounter: Payer: Self-pay | Admitting: Urology

## 2024-10-13 ENCOUNTER — Ambulatory Visit: Admitting: Urology

## 2024-10-13 VITALS — BP 117/75 | HR 76

## 2024-10-13 DIAGNOSIS — N3946 Mixed incontinence: Secondary | ICD-10-CM

## 2024-10-13 DIAGNOSIS — N3941 Urge incontinence: Secondary | ICD-10-CM

## 2024-10-13 MED ORDER — GEMTESA 75 MG PO TABS: 1.0000 | ORAL_TABLET | Freq: Every day | ORAL | Status: AC

## 2024-10-13 NOTE — Progress Notes (Signed)
 10/13/2024 2:02 PM   SOREN Washington 09/25/1956 [id number]  Referring provider: Carlette Benita Area, MD [address] [place],  [place] [address]  Urinary incontinence   HPI: Virginia Washington is a [phone] here for evaluation of urinary incontinence. For the past year she has noted increased urinary incontinence related to urge. She soaks 3-4 pads per day and 3 pads at night. No issues to SUI. Nocturia 2-3x. She is on linzess  for constipation. She drinks 1 cup of coffee daily. She drinks 1 soda daily   PMH: Past Medical History:  Diagnosis Date   Chronic back pain    Constipation    Constipation    COPD (chronic obstructive pulmonary disease) (HCC)    DVT (deep venous thrombosis) (HCC)    GERD (gastroesophageal reflux disease)    Headache    History of kidney stones    Hyperlipidemia    Hypertension     Surgical History: Past Surgical History:  Procedure Laterality Date   ABDOMINAL AORTOGRAM W/LOWER EXTREMITY N/A 10/26/2017   Procedure: ABDOMINAL AORTOGRAM W/LOWER EXTREMITY;  Surgeon: Harvey Carlin BRAVO, MD;  [facility]: [facility];  Service: Cardiovascular: 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  Normal right aortoiliac vessels.  Three-vessel runoff bilaterally.  (Sluggish flow of the left side due to occlusion.   ABDOMINAL HYSTERECTOMY     BALLOON DILATION N/A 10/05/2020   Procedure: BALLOON DILATION;  Surgeon: Cindie Carlin POUR, DO;  [facility]: [facility];  Service: Endoscopy;  Laterality: N/A;   BALLOON DILATION N/A 07/17/2022   Procedure: BALLOON DILATION;  Surgeon: Cindie Carlin POUR, DO;  [facility]: [facility];  Service: Endoscopy;  Laterality: N/A;   BALLOON DILATION N/A 10/12/2023   Procedure: BALLOON DILATION;  Surgeon: Cindie Carlin POUR, DO;  [facility];  Service: Endoscopy;  Laterality: N/A;   BIOPSY  10/05/2020   Procedure: BIOPSY;  Surgeon: Cindie Carlin POUR, DO;   [facility];  Service: Endoscopy;;   BIOPSY  07/17/2022   Procedure: BIOPSY;  Surgeon: Cindie Carlin POUR, DO;  [facility];  Service: Endoscopy;;   BIOPSY  10/12/2023   Procedure: BIOPSY;  Surgeon: Cindie Carlin POUR, DO;  [facility];  Service: Endoscopy;;   CHOLECYSTECTOMY     COLONOSCOPY  2007   Dr. Golda: small internal hemorrhoids, otherwise normal. exam could be compromised due to quality of prep   COLONOSCOPY N/A 02/15/2018   Dr. Harvey: 2 simple adenomas, external and internal hemorrhoids. Next colonoscopy 2024-2026   COLONOSCOPY WITH PROPOFOL  N/A 10/12/2023   Procedure: COLONOSCOPY WITH PROPOFOL ;  Surgeon: Cindie Carlin POUR, DO;  [facility];  Service: Endoscopy;  Laterality: N/A;  10:15 am, asa 3   ESOPHAGOGASTRODUODENOSCOPY N/A 05/09/2018   Procedure: ESOPHAGOGASTRODUODENOSCOPY (EGD);  Surgeon: Harvey Margo CROME, MD; web in the distal esophagus s/p dilated, mild gastritis biopsied, normal examined duodenum. Pathology with gastritis, negative for H. Pylori.     ESOPHAGOGASTRODUODENOSCOPY N/A 07/04/2024   Procedure: EGD (ESOPHAGOGASTRODUODENOSCOPY);  Surgeon: Cindie Carlin POUR, DO;  [facility];  Service: Endoscopy;  Laterality: N/A;  2:00 pm, ok rm 1-2   ESOPHAGOGASTRODUODENOSCOPY (EGD) WITH PROPOFOL  N/A 10/05/2020   Surgeon: Cindie Carlin POUR, DO; small hiatal hernia, grade B reflux esophagitis, benign-appearing esophageal stenosis dilated, gastritis biopsied- mild nonspecific reactive gastropathy, negative for H. pylori.   ESOPHAGOGASTRODUODENOSCOPY (EGD) WITH PROPOFOL  N/A 07/17/2022   Procedure: ESOPHAGOGASTRODUODENOSCOPY (EGD) WITH PROPOFOL ;  Surgeon: Cindie Carlin  K, DO;  [facility];  Service: Endoscopy;  Laterality: N/A;  9:45am   ESOPHAGOGASTRODUODENOSCOPY (EGD) WITH PROPOFOL  N/A 10/12/2023   Procedure: ESOPHAGOGASTRODUODENOSCOPY (EGD) WITH PROPOFOL ;  Surgeon: Cindie Carlin POUR, DO;  [facility];   Service: Endoscopy;  Laterality: N/A;   FEMORAL-FEMORAL BYPASS GRAFT Bilateral 10/21/2018   Procedure: RIGHT TO LEFT FEMORAL ARTERY BYPASS GRAFT;  Surgeon: Harvey Carlin BRAVO, MD;  Location: [facility] OR;  Service: Vascular;  Laterality: Bilateral;   LOWER EXTREMITY ANGIOGRAPHY N/A 09/27/2018   Procedure: LOWER EXTREMITY ANGIOGRAPHY;  Surgeon: Harvey Carlin BRAVO, MD;  [facility];  Service: Cardiovascular;  Laterality: N/A;   NM MYOVIEW  LTD  12/2014   [facility]: Normal Myoview .  Nonischemic.   POLYPECTOMY  10/12/2023   Procedure: POLYPECTOMY;  Surgeon: Cindie Carlin POUR, DO;  [facility];  Service: Endoscopy;;   TRANSTHORACIC ECHOCARDIOGRAM  12/2011   EF 55 to 60%.  No regional wall motion normality.  GRII DD.  No significant valve disease.   VASCULAR SURGERY      Home Medications:  Allergies as of 10/13/2024   No Known Allergies      Medication List        Accurate as of October 13, 2024  2:02 PM. If you have any questions, ask your nurse or doctor.          acetaminophen  500 MG tablet Commonly known as: TYLENOL  Take 2 tablets (1,000 mg total) by mouth every 6 (six) hours as needed.   albuterol  108 (90 Base) MCG/ACT inhaler Commonly known as: VENTOLIN  HFA Inhale 2 puffs into the lungs every 6 (six) hours as needed for wheezing or shortness of breath.   albuterol  108 (90 Base) MCG/ACT inhaler Commonly known as: VENTOLIN  HFA Inhale 2 puffs into the lungs every 6 (six) hours as needed for wheezing or shortness of breath.   amLODipine  5 MG tablet Commonly known as: NORVASC  Take 5 mg by mouth daily.   atorvastatin  20 MG tablet Commonly known as: LIPITOR Take 20 mg by mouth daily.   doxycycline  100 MG capsule Commonly known as: VIBRAMYCIN  Take 1 capsule (100 mg total) by mouth 2 (two) times daily.   gabapentin 100 MG capsule Commonly known as: NEURONTIN   hydrochlorothiazide 12.5 MG tablet Commonly known as: HYDRODIURIL Take 12.5 mg  by mouth daily.   HYDROcodone -acetaminophen  5-325 MG tablet Commonly known as: NORCO/VICODIN Take 1-2 tablets by mouth every 6 (six) hours as needed.   hydrocortisone  2.5 % rectal cream Commonly known as: ANUSOL -HC Place 1 Application rectally 2 (two) times daily.   hydrocortisone  25 MG suppository Commonly known as: ANUSOL -HC Place 1 suppository (25 mg total) rectally 2 (two) times daily.   ipratropium-albuterol  0.5-2.5 (3) MG/3ML Soln Commonly known as: DUONEB SMARTSIG:1 Vial(s) Via Nebulizer Every 4-6 Hours PRN   lidocaine  5 % Commonly known as: Lidoderm  Place 1 patch onto the skin daily. Remove & Discard patch within 12 hours or as directed by MD   Linzess  290 MCG Caps capsule Generic drug: linaclotide  TAKE 1 CAPSULE(290 MCG) BY MOUTH DAILY BEFORE BREAKFAST   Maalox Max 400-400-40 MG/5ML suspension Generic drug: alum & mag hydroxide-simeth Take 15 mLs by mouth every 6 (six) hours as needed for indigestion.   naproxen  375 MG tablet Commonly known as: NAPROSYN  Take 1 tablet (375 mg total) by mouth 2 (two) times daily.   ondansetron  4 MG tablet Commonly known as: ZOFRAN  Take 1 tablet (4 mg total) by mouth every 8 (  eight) hours as needed for nausea or vomiting.   ondansetron  4 MG tablet Commonly known as: ZOFRAN  Take 1 tablet (4 mg total) by mouth every 8 (eight) hours as needed for nausea or vomiting.   pantoprazole  40 MG tablet Commonly known as: PROTONIX  Take 1 tablet (40 mg total) by mouth 2 (two) times daily.   simethicone  125 MG chewable tablet Commonly known as: MYLICON Chew 1 tablet after each meal and at bedtime if needed   Trelegy Ellipta 100-62.5-25 MCG/ACT Aepb Generic drug: Fluticasone-Umeclidin-Vilant        Allergies: No Known Allergies  Family History: Family History  Problem Relation Age of Onset   Hypertension Mother    Peripheral Artery Disease Brother    Colon cancer Neg Hx    Colon polyps Neg Hx     Social History:  reports that  she has been smoking cigarettes. She has a 20 pack-year smoking history. She has never used smokeless tobacco. She reports that she does not drink alcohol  and does not use drugs.  ROS: All other review of systems were reviewed and are negative except what is noted above in HPI  Physical Exam: BP 117/75   Pulse 76   Constitutional:  Alert and oriented, No acute distress. HEENT: [place] AT, moist mucus membranes.  Trachea midline, no masses. Cardiovascular: No clubbing, cyanosis, or edema. Respiratory: Normal respiratory effort, no increased work of breathing. GI: Abdomen is soft, nontender, nondistended, no abdominal masses GU: No CVA tenderness.  Lymph: No cervical or inguinal lymphadenopathy. Skin: No rashes, bruises or suspicious lesions. Neurologic: Grossly intact, no focal deficits, moving all 4 extremities. Psychiatric: Normal mood and affect.  Laboratory Data: Lab Results  Component Value Date   WBC 7.4 09/09/2024   HGB 14.5 09/09/2024   HCT 46.3 (H) 09/09/2024   MCV 88.7 09/09/2024   PLT 247 09/09/2024    Lab Results  Component Value Date   CREATININE 1.11 (H) 09/09/2024    No results found for: PSA  No results found for: TESTOSTERONE  Lab Results  Component Value Date   HGBA1C 6.1 (H) 12/13/2014    Urinalysis    Component Value Date/Time   COLORURINE YELLOW 11/30/2023 0423   APPEARANCEUR CLEAR 11/30/2023 0423   APPEARANCEUR Clear 11/28/2023 0901   LABSPEC >1.046 (H) 11/30/2023 0423   PHURINE 7.0 11/30/2023 0423   GLUCOSEU NEGATIVE 11/30/2023 0423   HGBUR NEGATIVE 11/30/2023 0423   BILIRUBINUR NEGATIVE 11/30/2023 0423   BILIRUBINUR Negative 11/28/2023 0901   KETONESUR NEGATIVE 11/30/2023 0423   PROTEINUR NEGATIVE 11/30/2023 0423   UROBILINOGEN 0.2 09/19/2015 0928   NITRITE NEGATIVE 11/30/2023 0423   LEUKOCYTESUR NEGATIVE 11/30/2023 0423    Lab Results  Component Value Date   LABMICR Comment 11/28/2023   BACTERIA RARE (A) 04/28/2018     Pertinent Imaging:  Results for orders placed during the hospital encounter of 05/21/23  DG Abdomen 1 View  Narrative CLINICAL DATA:  Dempsey hematuria.  EXAM: ABDOMEN - 1 VIEW  COMPARISON:  December 21, 2021  FINDINGS: The bowel gas pattern is normal. Radiopaque surgical clips are seen overlying the right upper quadrant and left hemipelvis. No radio-opaque calculi or other significant radiographic abnormality are seen.  IMPRESSION: 1. No radiopaque calculi. 2. No evidence of bowel obstruction.   Electronically Signed By: Suzen Dials M.D. On: 05/21/2023 20:14  No results found for this or any previous visit.  No results found for this or any previous visit.  No results found for this or any  previous visit.  No results found for this or any previous visit.  No results found for this or any previous visit.  No results found for this or any previous visit.  Results for orders placed during the hospital encounter of 02/17/15  CT RENAL STONE STUDY  Narrative CLINICAL DATA:  Back pain radiating into the abdomen bilaterally. History of urinary tract stones.  EXAM: CT ABDOMEN AND PELVIS WITHOUT CONTRAST  TECHNIQUE: Multidetector CT imaging of the abdomen and pelvis was performed following the standard protocol without IV contrast.  COMPARISON:  CT abdomen and pelvis 10/20/2013 and 02/04/2012.  FINDINGS: Dependent atelectasis is seen in the lung bases. No pleural or pericardial effusion.  There is no hydronephrosis on the right or left. No renal or ureteral stones are identified. The kidneys have a normal uninfused appearance.  The patient is status post cholecystectomy. The liver, spleen, adrenal glands, pancreas and biliary tree appear normal. Extensive aortoiliac atherosclerosis without aneurysm is identified. The patient is status post hysterectomy. The stomach, small and large bowel and appendix appear normal. There is no lymphadenopathy  or fluid. No focal bony abnormality.  IMPRESSION: Negative for urinary tract stone.  No acute finding.  Aortoiliac atherosclerosis without aneurysm.  Status post cholecystectomy and hysterectomy.   Electronically Signed By: Debby Prader M.D. On: 02/17/2015 20:41   Assessment & Plan:    1. Urge urinary incontinence (Primary) We will trial Gemtesa 75mg  daily  - Urinalysis, Routine w reflex microscopic   No follow-ups on file.  Belvie Clara, MD  [facility] [place]

## 2024-10-13 NOTE — Patient Instructions (Signed)

## 2024-10-31 DIAGNOSIS — I10 Essential (primary) hypertension: Secondary | ICD-10-CM | POA: Diagnosis not present

## 2024-10-31 DIAGNOSIS — J440 Chronic obstructive pulmonary disease with acute lower respiratory infection: Secondary | ICD-10-CM | POA: Diagnosis not present

## 2024-11-04 DIAGNOSIS — N1831 Chronic kidney disease, stage 3a: Secondary | ICD-10-CM | POA: Diagnosis not present

## 2024-11-04 DIAGNOSIS — K5900 Constipation, unspecified: Secondary | ICD-10-CM | POA: Diagnosis not present

## 2024-11-04 DIAGNOSIS — M1990 Unspecified osteoarthritis, unspecified site: Secondary | ICD-10-CM | POA: Diagnosis not present

## 2024-11-04 DIAGNOSIS — I739 Peripheral vascular disease, unspecified: Secondary | ICD-10-CM | POA: Diagnosis not present

## 2024-11-04 DIAGNOSIS — I129 Hypertensive chronic kidney disease with stage 1 through stage 4 chronic kidney disease, or unspecified chronic kidney disease: Secondary | ICD-10-CM | POA: Diagnosis not present

## 2024-11-04 DIAGNOSIS — Z86718 Personal history of other venous thrombosis and embolism: Secondary | ICD-10-CM | POA: Diagnosis not present

## 2024-11-04 DIAGNOSIS — M519 Unspecified thoracic, thoracolumbar and lumbosacral intervertebral disc disorder: Secondary | ICD-10-CM | POA: Diagnosis not present

## 2024-11-04 DIAGNOSIS — I700 Atherosclerosis of aorta: Secondary | ICD-10-CM | POA: Diagnosis not present

## 2024-11-04 DIAGNOSIS — K219 Gastro-esophageal reflux disease without esophagitis: Secondary | ICD-10-CM | POA: Diagnosis not present

## 2024-11-04 DIAGNOSIS — Z8616 Personal history of COVID-19: Secondary | ICD-10-CM | POA: Diagnosis not present

## 2024-11-04 DIAGNOSIS — E785 Hyperlipidemia, unspecified: Secondary | ICD-10-CM | POA: Diagnosis not present

## 2024-11-04 DIAGNOSIS — N3941 Urge incontinence: Secondary | ICD-10-CM | POA: Diagnosis not present

## 2024-11-04 DIAGNOSIS — K224 Dyskinesia of esophagus: Secondary | ICD-10-CM | POA: Diagnosis not present

## 2024-11-04 DIAGNOSIS — J449 Chronic obstructive pulmonary disease, unspecified: Secondary | ICD-10-CM | POA: Diagnosis not present

## 2024-12-02 ENCOUNTER — Other Ambulatory Visit (HOSPITAL_COMMUNITY): Payer: Self-pay | Admitting: Internal Medicine

## 2024-12-02 ENCOUNTER — Ambulatory Visit (HOSPITAL_COMMUNITY)
Admission: RE | Admit: 2024-12-02 | Discharge: 2024-12-02 | Disposition: A | Source: Ambulatory Visit | Attending: Gerontology | Admitting: Gerontology

## 2024-12-02 ENCOUNTER — Other Ambulatory Visit (HOSPITAL_COMMUNITY): Payer: Self-pay | Admitting: Gerontology

## 2024-12-02 DIAGNOSIS — R059 Cough, unspecified: Secondary | ICD-10-CM | POA: Insufficient documentation

## 2024-12-02 DIAGNOSIS — Z1231 Encounter for screening mammogram for malignant neoplasm of breast: Secondary | ICD-10-CM

## 2024-12-19 ENCOUNTER — Ambulatory Visit (HOSPITAL_COMMUNITY)
Admission: RE | Admit: 2024-12-19 | Discharge: 2024-12-19 | Disposition: A | Discharge status: Home / Self Care | Source: Ambulatory Visit | Attending: Internal Medicine | Admitting: Internal Medicine

## 2024-12-19 DIAGNOSIS — Z1231 Encounter for screening mammogram for malignant neoplasm of breast: Secondary | ICD-10-CM | POA: Insufficient documentation

## 2024-12-24 ENCOUNTER — Other Ambulatory Visit (HOSPITAL_COMMUNITY): Payer: Self-pay | Admitting: Internal Medicine

## 2024-12-24 DIAGNOSIS — R928 Other abnormal and inconclusive findings on diagnostic imaging of breast: Secondary | ICD-10-CM

## 2025-01-02 ENCOUNTER — Encounter (HOSPITAL_COMMUNITY): Payer: Self-pay | Admitting: Emergency Medicine

## 2025-01-02 ENCOUNTER — Emergency Department (HOSPITAL_COMMUNITY)

## 2025-01-02 ENCOUNTER — Emergency Department (HOSPITAL_COMMUNITY)
Admission: EM | Admit: 2025-01-02 | Discharge: 2025-01-02 | Disposition: A | Discharge status: Home / Self Care | Attending: Emergency Medicine | Admitting: Emergency Medicine

## 2025-01-02 ENCOUNTER — Other Ambulatory Visit: Payer: Self-pay

## 2025-01-02 DIAGNOSIS — J101 Influenza due to other identified influenza virus with other respiratory manifestations: Secondary | ICD-10-CM | POA: Insufficient documentation

## 2025-01-02 DIAGNOSIS — I10 Essential (primary) hypertension: Secondary | ICD-10-CM | POA: Diagnosis not present

## 2025-01-02 DIAGNOSIS — J441 Chronic obstructive pulmonary disease with (acute) exacerbation: Secondary | ICD-10-CM | POA: Insufficient documentation

## 2025-01-02 DIAGNOSIS — R059 Cough, unspecified: Secondary | ICD-10-CM | POA: Diagnosis present

## 2025-01-02 DIAGNOSIS — Z79899 Other long term (current) drug therapy: Secondary | ICD-10-CM | POA: Insufficient documentation

## 2025-01-02 LAB — CBC
HCT: 44 % (ref 36.0–46.0)
Hemoglobin: 13.8 g/dL (ref 12.0–15.0)
MCH: 27.8 pg (ref 26.0–34.0)
MCHC: 31.4 g/dL (ref 30.0–36.0)
MCV: 88.5 fL (ref 80.0–100.0)
Platelets: 225 K/uL (ref 150–400)
RBC: 4.97 MIL/uL (ref 3.87–5.11)
RDW: 14.8 % (ref 11.5–15.5)
WBC: 8.5 K/uL (ref 4.0–10.5)
nRBC: 0 % (ref 0.0–0.2)

## 2025-01-02 LAB — BASIC METABOLIC PANEL WITH GFR
Anion gap: 12 (ref 5–15)
BUN: 10 mg/dL (ref 8–23)
CO2: 24 mmol/L (ref 22–32)
Calcium: 8.9 mg/dL (ref 8.9–10.3)
Chloride: 105 mmol/L (ref 98–111)
Creatinine, Ser: 0.9 mg/dL (ref 0.44–1.00)
GFR, Estimated: >60 mL/min
Glucose, Bld: 96 mg/dL (ref 70–99)
Potassium: 3.7 mmol/L (ref 3.5–5.1)
Sodium: 141 mmol/L (ref 135–145)

## 2025-01-02 LAB — RESP PANEL BY RT-PCR (RSV, FLU A&B, COVID)  RVPGX2
Influenza A by PCR: POSITIVE — AB
Influenza B by PCR: NEGATIVE
Resp Syncytial Virus by PCR: NEGATIVE
SARS Coronavirus 2 by RT PCR: NEGATIVE

## 2025-01-02 LAB — TROPONIN T, HIGH SENSITIVITY
Troponin T High Sensitivity: <15 ng/L (ref 0–19)
Troponin T High Sensitivity: <15 ng/L (ref 0–19)

## 2025-01-02 MED ORDER — PREDNISONE 50 MG PO TABS: 50.0000 mg | ORAL_TABLET | Freq: Every day | ORAL | 0 refills | Status: AC

## 2025-01-02 MED ORDER — HYDROCOD POLI-CHLORPHE POLI ER 10-8 MG/5ML PO SUER
5.0000 mL | Freq: Once | ORAL | Status: AC
  Administered 2025-01-02: 5 mL via ORAL
  Filled 2025-01-02: qty 5

## 2025-01-02 MED ORDER — GUAIFENESIN-CODEINE 100-10 MG/5ML PO SOLN: 5.0000 mL | Freq: Four times a day (QID) | ORAL | 0 refills | Status: AC | PRN

## 2025-01-02 MED ORDER — METHYLPREDNISOLONE SODIUM SUCC 125 MG IJ SOLR
125.0000 mg | Freq: Once | INTRAMUSCULAR | Status: AC
  Administered 2025-01-02: 125 mg via INTRAVENOUS
  Filled 2025-01-02: qty 2

## 2025-01-02 MED ORDER — IPRATROPIUM-ALBUTEROL 0.5-2.5 (3) MG/3ML IN SOLN
3.0000 mL | Freq: Once | RESPIRATORY_TRACT | Status: AC
  Administered 2025-01-02: 3 mL via RESPIRATORY_TRACT
  Filled 2025-01-02: qty 3

## 2025-01-02 MED ORDER — OSELTAMIVIR PHOSPHATE 75 MG PO CAPS: 75.0000 mg | ORAL_CAPSULE | Freq: Two times a day (BID) | ORAL | 0 refills | Status: AC

## 2025-01-02 NOTE — ED Triage Notes (Signed)
 Pt c/o of a cough, chest pain and right arm pain that started last night. Pt states she has also been exposed to flu.

## 2025-01-02 NOTE — ED Notes (Signed)
 See triage notes. Pt a/o, color wnl. No shob/resp distress noted. Chest pain with coughing.

## 2025-01-02 NOTE — ED Provider Notes (Signed)
 " [place] EMERGENCY DEPARTMENT AT [facility] Provider Note   CSN: [id number] Arrival date & time: 01/02/25  [id number]     Patient presents with: Chest Pain   Virginia Washington is a 69 y.o. female.   Pt is a 69 yo female with pmhx significant for COPD, HTN, HLD, GERD, kidney stones, GERD, overactive bladder, and constipation.  Pt said her granddaughter had the flu earlier this week and she thinks she has it now.  She developed cough and cp last night.  She has cp with coughing.  She could not sleep due to constant coughing.  No fever.       Prior to Admission medications  Medication Sig Start Date End Date Taking? Authorizing Provider  guaiFENesin -codeine  100-10 MG/5ML syrup Take 5 mLs by mouth every 6 (six) hours as needed for cough. 01/02/25  Yes Dean Clarity, MD  oseltamivir  (TAMIFLU ) 75 MG capsule Take 1 capsule (75 mg total) by mouth every 12 (twelve) hours. 01/02/25  Yes Dean Clarity, MD  predniSONE  (DELTASONE ) 50 MG tablet Take 1 tablet (50 mg total) by mouth daily with breakfast. 01/02/25  Yes Dean Clarity, MD  acetaminophen  (TYLENOL ) 500 MG tablet Take 2 tablets (1,000 mg total) by mouth every 6 (six) hours as needed. 08/02/24   Suellen Cantor A, PA-C  albuterol  (VENTOLIN  HFA) 108 (90 Base) MCG/ACT inhaler Inhale 2 puffs into the lungs every 6 (six) hours as needed for wheezing or shortness of breath. 09/25/23   Odell Balls, PA-C  albuterol  (VENTOLIN  HFA) 108 (90 Base) MCG/ACT inhaler Inhale 2 puffs into the lungs every 6 (six) hours as needed for wheezing or shortness of breath. 09/09/24   Sofia, Leslie K, PA-C  alum & mag hydroxide-simeth (MAALOX MAX) 400-400-40 MG/5ML suspension Take 15 mLs by mouth every 6 (six) hours as needed for indigestion. 06/23/22   Kommor, Madison, MD  amLODipine  (NORVASC ) 5 MG tablet Take 5 mg by mouth daily.    [provider]  atorvastatin  (LIPITOR) 20 MG tablet Take 20 mg by mouth daily. 10/03/22   [provider]   doxycycline  (VIBRAMYCIN ) 100 MG capsule Take 1 capsule (100 mg total) by mouth 2 (two) times daily. 09/09/24   Sofia, Leslie K, PA-C  gabapentin (NEURONTIN) 100 MG capsule  12/13/22   [provider]  hydrochlorothiazide (HYDRODIURIL) 12.5 MG tablet Take 12.5 mg by mouth daily. 03/02/22   [provider]  HYDROcodone -acetaminophen  (NORCO/VICODIN) 5-325 MG tablet Take 1-2 tablets by mouth every 6 (six) hours as needed. 06/25/24   Geroldine Berg, MD  hydrocortisone  (ANUSOL -HC) 2.5 % rectal cream Place 1 Application rectally 2 (two) times daily. 12/19/23   Cleotilde Rogue, MD  hydrocortisone  (ANUSOL -HC) 25 MG suppository Place 1 suppository (25 mg total) rectally 2 (two) times daily. 12/19/23   Cleotilde Rogue, MD  ipratropium-albuterol  (DUONEB) 0.5-2.5 (3) MG/3ML SOLN SMARTSIG:1 Vial(s) Via Nebulizer Every 4-6 Hours PRN 09/08/22   [provider]  lidocaine  (LIDODERM ) 5 % Place 1 patch onto the skin daily. Remove & Discard patch within 12 hours or as directed by MD 08/02/24   Suellen Cantor A, PA-C  linaclotide  (LINZESS ) 290 MCG CAPS capsule TAKE 1 CAPSULE(290 MCG) BY MOUTH DAILY BEFORE BREAKFAST 07/16/24   Rudy Josette RAMAN, PA-C  naproxen  (NAPROSYN ) 375 MG tablet Take 1 tablet (375 mg total) by mouth 2 (two) times daily. 06/25/24   Geroldine Berg, MD  ondansetron  (ZOFRAN ) 4 MG tablet Take 1 tablet (4 mg total) by mouth every 8 (eight) hours as  needed for nausea or vomiting. 11/30/23   Mesner, Selinda, MD  ondansetron  (ZOFRAN ) 4 MG tablet Take 1 tablet (4 mg total) by mouth every 8 (eight) hours as needed for nausea or vomiting. 11/30/23   Mesner, Selinda, MD  pantoprazole  (PROTONIX ) 40 MG tablet Take 1 tablet (40 mg total) by mouth 2 (two) times daily. 07/02/24   Rudy Josette RAMAN, PA-C  simethicone  (MYLICON) 125 MG chewable tablet Chew 1 tablet after each meal and at bedtime if needed Patient not taking: Reported on 07/02/2024 01/12/22   Cindie Carlin POUR, DO  TRELEGY ELLIPTA 100-62.5-25 MCG/ACT  AEPB  12/13/22   [provider]  Vibegron  (GEMTESA ) 75 MG TABS Take 1 tablet (75 mg total) by mouth daily. 10/13/24   McKenzie, Belvie CROME, MD    Allergies: Patient has no known allergies.    Review of Systems  Respiratory:  Positive for cough, shortness of breath and wheezing.   All other systems reviewed and are negative.   Updated Vital Signs BP 129/70   Pulse 81   Temp 98.4 F (36.9 C) (Oral)   Resp 20   Ht 5' 2 (1.575 m)   Wt 61 kg   SpO2 98%   BMI 24.60 kg/m   Physical Exam Vitals and nursing note reviewed.  Constitutional:      Appearance: She is well-developed.  HENT:     Head: Normocephalic and atraumatic.  Eyes:     Extraocular Movements: Extraocular movements intact.     Pupils: Pupils are equal, round, and reactive to light.  Cardiovascular:     Rate and Rhythm: Normal rate and regular rhythm.     Heart sounds: Normal heart sounds.  Pulmonary:     Effort: Pulmonary effort is normal.     Breath sounds: Wheezing present.  Abdominal:     General: Bowel sounds are normal.     Palpations: Abdomen is soft.  Musculoskeletal:        General: Normal range of motion.     Cervical back: Normal range of motion and neck supple.  Skin:    General: Skin is warm.     Capillary Refill: Capillary refill takes less than 2 seconds.  Neurological:     General: No focal deficit present.     Mental Status: She is alert and oriented to person, place, and time.  Psychiatric:        Mood and Affect: Mood normal.        Behavior: Behavior normal.     (all labs ordered are listed, but only abnormal results are displayed) Labs Reviewed  RESP PANEL BY RT-PCR (RSV, FLU A&B, COVID)  RVPGX2 - Abnormal; Notable for the following components:      Result Value   Influenza A by PCR POSITIVE (*)    All other components within normal limits  CULTURE, BLOOD (ROUTINE X 2)  CULTURE, BLOOD (ROUTINE X 2)  BASIC METABOLIC PANEL WITH GFR  CBC  TROPONIN T, HIGH SENSITIVITY   TROPONIN T, HIGH SENSITIVITY    EKG: EKG Interpretation Date/Time:  Friday January 02 2025 07:07:18 EST Ventricular Rate:  106 PR Interval:  191 QRS Duration:  79 QT Interval:  331 QTC Calculation: 442 R Axis:   67  Text Interpretation: Sinus tachycardia Biatrial enlargement Borderline T abnormalities, diffuse leads No significant change since last tracing Confirmed by Dean Clarity [phone]) on 01/02/2025 8:14:16 AM  Radiology: [facility] Chest 2 View Result Date: 01/02/2025 EXAM: 2 VIEW(S) XRAY OF THE CHEST 01/02/2025 07:25:00  AM COMPARISON: 12/02/2024 CLINICAL HISTORY: chest pain chest pain chest pain FINDINGS: LUNGS AND PLEURA: No focal pulmonary opacity. No pleural effusion. No pneumothorax. HEART AND MEDIASTINUM: Aortic arch calcifications. No acute abnormality of the cardiac and mediastinal silhouettes. BONES AND SOFT TISSUES: Surgical clips noted in the right upper quadrant of the abdomen. No acute osseous abnormality. IMPRESSION: 1. No acute findings. Electronically signed by: Waddell Calk MD 01/02/2025 07:30 AM EST RP Workstation: [id number]     Procedures   Medications Ordered in the ED  methylPREDNISolone  sodium succinate (SOLU-MEDROL ) 125 mg/2 mL injection 125 mg (125 mg Intravenous Given 01/02/25 0742)  ipratropium-albuterol  (DUONEB) 0.5-2.5 (3) MG/3ML nebulizer solution 3 mL (3 mLs Nebulization Given 01/02/25 0742)  chlorpheniramine-HYDROcodone  (TUSSIONEX) 10-8 MG/5ML suspension 5 mL (5 mLs Oral Given 01/02/25 0725)                                    Medical Decision Making Amount and/or Complexity of Data Reviewed Labs: ordered. Radiology: ordered.  Risk OTC drugs. Prescription drug management.   This patient presents to the ED for concern of sob, cough, this involves an extensive number of treatment options, and is a complaint that carries with it a high risk of complications and morbidity.  The differential diagnosis includes covid/flu/rsv, pna, copd exac,  bronchitis   Co morbidities that complicate the patient evaluation  COPD, HTN, HLD, GERD, kidney stones, GERD, overactive bladder, and constipation   Additional history obtained:  Additional history obtained from epic chart review   Lab Tests:  I Ordered, and personally interpreted labs.  The pertinent results include:  cbc, bmp nl, trop, covid neg, flu a +   Imaging Studies ordered:  I ordered imaging studies including cxr  I independently visualized and interpreted imaging which showed No acute findings.  I agree with the radiologist interpretation   Cardiac Monitoring:  The patient was maintained on a cardiac monitor.  I personally viewed and interpreted the cardiac monitored which showed an underlying rhythm of: nsr   Medicines ordered and prescription drug management:  I ordered medication including duoneb, solumedrol, tussionex  for sx  Reevaluation of the patient after these medicines showed that the patient improved I have reviewed the patients home medicines and have made adjustments as needed   Problem List / ED Course:  Influenza A with COPD exac:  pt is feeling better.  She is saturating well.  She is stable for d/c.  She does want the rx for tamiflu .  She is to return if worse.  F/u with pcp.   Reevaluation:  After the interventions noted above, I reevaluated the patient and found that they have :improved   Social Determinants of Health:  Lives at home   Dispostion:  After consideration of the diagnostic results and the patients response to treatment, I feel that the patent would benefit from discharge with outpatient f/u.       Final diagnoses:  Influenza A  COPD exacerbation [facility])    ED Discharge Orders          Ordered    oseltamivir  (TAMIFLU ) 75 MG capsule  Every 12 hours        01/02/25 0857    predniSONE  (DELTASONE ) 50 MG tablet  Daily with breakfast        01/02/25 0857    guaiFENesin -codeine  100-10 MG/5ML syrup  Every 6 hours  PRN  01/02/25 9142               Dean Clarity, MD 01/02/25 0900  "

## 2025-01-07 LAB — CULTURE, BLOOD (ROUTINE X 2)
Culture: NO GROWTH
Culture: NO GROWTH
Special Requests: ADEQUATE
Special Requests: ADEQUATE

## 2025-01-10 ENCOUNTER — Emergency Department (HOSPITAL_COMMUNITY)

## 2025-01-10 ENCOUNTER — Other Ambulatory Visit: Payer: Self-pay

## 2025-01-10 ENCOUNTER — Emergency Department (HOSPITAL_COMMUNITY)
Admission: EM | Admit: 2025-01-10 | Discharge: 2025-01-10 | Disposition: A | Discharge status: Home / Self Care | Attending: Emergency Medicine | Admitting: Emergency Medicine

## 2025-01-10 ENCOUNTER — Encounter (HOSPITAL_COMMUNITY): Payer: Self-pay

## 2025-01-10 DIAGNOSIS — J069 Acute upper respiratory infection, unspecified: Secondary | ICD-10-CM | POA: Diagnosis not present

## 2025-01-10 DIAGNOSIS — F17200 Nicotine dependence, unspecified, uncomplicated: Secondary | ICD-10-CM | POA: Diagnosis not present

## 2025-01-10 DIAGNOSIS — R059 Cough, unspecified: Secondary | ICD-10-CM | POA: Diagnosis present

## 2025-01-10 DIAGNOSIS — J441 Chronic obstructive pulmonary disease with (acute) exacerbation: Secondary | ICD-10-CM | POA: Diagnosis not present

## 2025-01-10 LAB — CBC
HCT: 42.3 % (ref 36.0–46.0)
Hemoglobin: 13.5 g/dL (ref 12.0–15.0)
MCH: 27.9 pg (ref 26.0–34.0)
MCHC: 31.9 g/dL (ref 30.0–36.0)
MCV: 87.4 fL (ref 80.0–100.0)
Platelets: 240 10*3/uL (ref 150–400)
RBC: 4.84 MIL/uL (ref 3.87–5.11)
RDW: 14.9 % (ref 11.5–15.5)
WBC: 10.1 10*3/uL (ref 4.0–10.5)
nRBC: 0 % (ref 0.0–0.2)

## 2025-01-10 LAB — COMPREHENSIVE METABOLIC PANEL WITH GFR
ALT: 15 U/L (ref 0–44)
AST: 15 U/L (ref 15–41)
Albumin: 3.7 g/dL (ref 3.5–5.0)
Alkaline Phosphatase: 96 U/L (ref 38–126)
Anion gap: 13 (ref 5–15)
BUN: 12 mg/dL (ref 8–23)
CO2: 22 mmol/L (ref 22–32)
Calcium: 8.7 mg/dL — ABNORMAL LOW (ref 8.9–10.3)
Chloride: 107 mmol/L (ref 98–111)
Creatinine, Ser: 0.94 mg/dL (ref 0.44–1.00)
GFR, Estimated: >60 mL/min
Glucose, Bld: 94 mg/dL (ref 70–99)
Potassium: 3.6 mmol/L (ref 3.5–5.1)
Sodium: 142 mmol/L (ref 135–145)
Total Bilirubin: 0.3 mg/dL (ref 0.0–1.2)
Total Protein: 6.6 g/dL (ref 6.5–8.1)

## 2025-01-10 LAB — URINALYSIS, ROUTINE W REFLEX MICROSCOPIC
Bilirubin Urine: NEGATIVE
Glucose, UA: NEGATIVE mg/dL
Hgb urine dipstick: NEGATIVE
Ketones, ur: 5 mg/dL — AB
Leukocytes,Ua: NEGATIVE
Nitrite: NEGATIVE
Protein, ur: NEGATIVE mg/dL
Specific Gravity, Urine: 1.019 (ref 1.005–1.030)
pH: 7 (ref 5.0–8.0)

## 2025-01-10 LAB — TROPONIN T, HIGH SENSITIVITY
Troponin T High Sensitivity: <6 ng/L (ref 0–19)
Troponin T High Sensitivity: <6 ng/L (ref 0–19)

## 2025-01-10 LAB — RESP PANEL BY RT-PCR (RSV, FLU A&B, COVID)  RVPGX2
Influenza A by PCR: NEGATIVE
Influenza B by PCR: NEGATIVE
Resp Syncytial Virus by PCR: NEGATIVE
SARS Coronavirus 2 by RT PCR: NEGATIVE

## 2025-01-10 LAB — PRO BRAIN NATRIURETIC PEPTIDE: Pro Brain Natriuretic Peptide: 75.3 pg/mL

## 2025-01-10 MED ORDER — ALBUTEROL SULFATE (2.5 MG/3ML) 0.083% IN NEBU
5.0000 mg | INHALATION_SOLUTION | Freq: Once | RESPIRATORY_TRACT | Status: AC
  Administered 2025-01-10: 5 mg via RESPIRATORY_TRACT
  Filled 2025-01-10: qty 6

## 2025-01-10 MED ORDER — METHYLPREDNISOLONE SODIUM SUCC 125 MG IJ SOLR
80.0000 mg | Freq: Once | INTRAMUSCULAR | Status: AC
  Administered 2025-01-10: 80 mg via INTRAVENOUS
  Filled 2025-01-10: qty 2

## 2025-01-10 MED ORDER — PREDNISONE 20 MG PO TABS: 40.0000 mg | ORAL_TABLET | Freq: Every day | ORAL | 0 refills | Status: AC

## 2025-01-10 MED ORDER — IPRATROPIUM BROMIDE 0.02 % IN SOLN
0.5000 mg | Freq: Once | RESPIRATORY_TRACT | Status: AC
  Administered 2025-01-10: 0.5 mg via RESPIRATORY_TRACT
  Filled 2025-01-10: qty 2.5

## 2025-01-10 MED ORDER — ALBUTEROL SULFATE HFA 108 (90 BASE) MCG/ACT IN AERS: 2.0000 | INHALATION_SPRAY | RESPIRATORY_TRACT | 1 refills | Status: AC | PRN

## 2025-01-10 NOTE — ED Triage Notes (Signed)
 Pt presents to ED from home C/O not getting better since I had the flu. Dx 1/16, continues to have cough, SOB.

## 2025-01-10 NOTE — ED Provider Notes (Signed)
 "  EMERGENCY DEPARTMENT AT [facility] Provider Note   CSN: [id number] Arrival date & time: 01/10/25  8182     Patient presents with: Shortness of Breath   [place]ia Washington is a 69 y.o. female.   Pt with hx copd c/o sob, body aches, non prod cough. States had flu earlier in month but is not feeling any better. +smoker. Uses home mdi/breathing treatments. Indicates chest has felt tight in past few days.  No def fevers. No sore throat or trouble swallowing. No severe headaches. No neck pain/stiffness. No abd pain or nvd. No dysuria or gu c/o. No extremity pain or swelling.   The history is provided by the patient and medical records.  Shortness of Breath Associated symptoms: chest pain, cough and wheezing   Associated symptoms: no abdominal pain, no fever, no headaches, no neck pain, no rash, no sore throat and no vomiting        Prior to Admission medications  Medication Sig Start Date End Date Taking? Authorizing Provider  acetaminophen  (TYLENOL ) 500 MG tablet Take 2 tablets (1,000 mg total) by mouth every 6 (six) hours as needed. 08/02/24   Suellen Cantor A, PA-C  albuterol  (VENTOLIN  HFA) 108 (90 Base) MCG/ACT inhaler Inhale 2 puffs into the lungs every 6 (six) hours as needed for wheezing or shortness of breath. 09/25/23   Odell Balls, PA-C  albuterol  (VENTOLIN  HFA) 108 (90 Base) MCG/ACT inhaler Inhale 2 puffs into the lungs every 6 (six) hours as needed for wheezing or shortness of breath. 09/09/24   Sofia, Leslie K, PA-C  alum & mag hydroxide-simeth (MAALOX MAX) 400-400-40 MG/5ML suspension Take 15 mLs by mouth every 6 (six) hours as needed for indigestion. 06/23/22   Kommor, Madison, MD  amLODipine  (NORVASC ) 5 MG tablet Take 5 mg by mouth daily.    [provider]  atorvastatin  (LIPITOR) 20 MG tablet Take 20 mg by mouth daily. 10/03/22   [provider]  doxycycline  (VIBRAMYCIN ) 100 MG capsule Take 1 capsule (100 mg total) by mouth 2 (two)  times daily. 09/09/24   Sofia, Leslie K, PA-C  gabapentin (NEURONTIN) 100 MG capsule  12/13/22   [provider]  guaiFENesin -codeine  100-10 MG/5ML syrup Take 5 mLs by mouth every 6 (six) hours as needed for cough. 01/02/25   Haviland, Julie, MD  hydrochlorothiazide (HYDRODIURIL) 12.5 MG tablet Take 12.5 mg by mouth daily. 03/02/22   [provider]  HYDROcodone -acetaminophen  (NORCO/VICODIN) 5-325 MG tablet Take 1-2 tablets by mouth every 6 (six) hours as needed. 06/25/24   Geroldine Berg, MD  hydrocortisone  (ANUSOL -HC) 2.5 % rectal cream Place 1 Application rectally 2 (two) times daily. 12/19/23   Cleotilde Rogue, MD  hydrocortisone  (ANUSOL -HC) 25 MG suppository Place 1 suppository (25 mg total) rectally 2 (two) times daily. 12/19/23   Cleotilde Rogue, MD  ipratropium-albuterol  (DUONEB) 0.5-2.5 (3) MG/3ML SOLN SMARTSIG:1 Vial(s) Via Nebulizer Every 4-6 Hours PRN 09/08/22   [provider]  lidocaine  (LIDODERM ) 5 % Place 1 patch onto the skin daily. Remove & Discard patch within 12 hours or as directed by MD 08/02/24   Suellen Cantor A, PA-C  linaclotide  (LINZESS ) 290 MCG CAPS capsule TAKE 1 CAPSULE(290 MCG) BY MOUTH DAILY BEFORE BREAKFAST 07/16/24   Rudy Josette RAMAN, PA-C  naproxen  (NAPROSYN ) 375 MG tablet Take 1 tablet (375 mg total) by mouth 2 (two) times daily. 06/25/24   Geroldine Berg, MD  ondansetron  (ZOFRAN ) 4 MG tablet Take 1 tablet (4 mg total) by mouth every 8 (  eight) hours as needed for nausea or vomiting. 11/30/23   Mesner, Selinda, MD  ondansetron  (ZOFRAN ) 4 MG tablet Take 1 tablet (4 mg total) by mouth every 8 (eight) hours as needed for nausea or vomiting. 11/30/23   Mesner, Selinda, MD  oseltamivir  (TAMIFLU ) 75 MG capsule Take 1 capsule (75 mg total) by mouth every 12 (twelve) hours. 01/02/25   Dean Clarity, MD  pantoprazole  (PROTONIX ) 40 MG tablet Take 1 tablet (40 mg total) by mouth 2 (two) times daily. 07/02/24   Rudy Josette RAMAN, PA-C  predniSONE  (DELTASONE ) 50 MG tablet  Take 1 tablet (50 mg total) by mouth daily with breakfast. 01/02/25   Dean Clarity, MD  simethicone  (MYLICON) 125 MG chewable tablet Chew 1 tablet after each meal and at bedtime if needed Patient not taking: Reported on 07/02/2024 01/12/22   Cindie Carlin POUR, DO  TRELEGY ELLIPTA 100-62.5-25 MCG/ACT AEPB  12/13/22   [provider]  Vibegron  (GEMTESA ) 75 MG TABS Take 1 tablet (75 mg total) by mouth daily. 10/13/24   McKenzie, Belvie CROME, MD    Allergies: Patient has no known allergies.    Review of Systems  Constitutional:  Negative for chills and fever.  HENT:  Positive for congestion and rhinorrhea. Negative for sinus pain and sore throat.   Eyes:  Negative for discharge and redness.  Respiratory:  Positive for cough, shortness of breath and wheezing.   Cardiovascular:  Positive for chest pain. Negative for palpitations and leg swelling.  Gastrointestinal:  Negative for abdominal pain, diarrhea and vomiting.  Genitourinary:  Negative for dysuria and flank pain.  Musculoskeletal:  Positive for myalgias. Negative for back pain, neck pain and neck stiffness.  Skin:  Negative for rash.  Neurological:  Negative for headaches.    Updated Vital Signs BP 131/82 (BP Location: Right Arm)   Pulse 76   Temp 99.9 F (37.7 C)   Resp 16   SpO2 98%   Physical Exam Vitals and nursing note reviewed.  Constitutional:      Appearance: Normal appearance. She is well-developed.  HENT:     Head: Atraumatic.     Right Ear: Tympanic membrane normal.     Left Ear: Tympanic membrane normal.     Nose: Congestion present.     Mouth/Throat:     Mouth: Mucous membranes are moist.     Pharynx: Oropharynx is clear. No oropharyngeal exudate or posterior oropharyngeal erythema.  Eyes:     General: No scleral icterus.    Conjunctiva/sclera: Conjunctivae normal.     Pupils: Pupils are equal, round, and reactive to light.  Neck:     Trachea: No tracheal deviation.     Comments: No stiffness or  rigidity.  Cardiovascular:     Rate and Rhythm: Normal rate and regular rhythm.     Pulses: Normal pulses.     Heart sounds: Normal heart sounds. No murmur heard.    No friction rub. No gallop.  Pulmonary:     Effort: Pulmonary effort is normal. No respiratory distress.     Breath sounds: Wheezing present.  Abdominal:     General: Bowel sounds are normal. There is no distension.     Palpations: Abdomen is soft.     Tenderness: There is no abdominal tenderness. There is no guarding.  Genitourinary:    Comments: No cva tenderness.  Musculoskeletal:        General: No swelling or tenderness.     Cervical back: Normal range of motion and neck  supple. No rigidity. No muscular tenderness.     Right lower leg: No edema.     Left lower leg: No edema.  Lymphadenopathy:     Cervical: No cervical adenopathy.  Skin:    General: Skin is warm and dry.     Findings: No rash.  Neurological:     Mental Status: She is alert.     Comments: Alert, speech normal. Motor/sens grossly intact bil.   Psychiatric:        Mood and Affect: Mood normal.     (all labs ordered are listed, but only abnormal results are displayed) Results for orders placed or performed during the hospital encounter of 01/10/25  Resp panel by RT-PCR (RSV, Flu A&B, Covid) Anterior Nasal Swab   Collection Time: 01/10/25  7:04 PM   Specimen: Anterior Nasal Swab  Result Value Ref Range   SARS Coronavirus 2 by RT PCR NEGATIVE NEGATIVE   Influenza A by PCR NEGATIVE NEGATIVE   Influenza B by PCR NEGATIVE NEGATIVE   Resp Syncytial Virus by PCR NEGATIVE NEGATIVE  CBC   Collection Time: 01/10/25  7:34 PM  Result Value Ref Range   WBC 10.1 4.0 - 10.5 K/uL   RBC 4.84 3.87 - 5.11 MIL/uL   Hemoglobin 13.5 12.0 - 15.0 g/dL   HCT 57.6 63.9 - 53.9 %   MCV 87.4 80.0 - 100.0 fL   MCH 27.9 26.0 - 34.0 pg   MCHC 31.9 30.0 - 36.0 g/dL   RDW 85.0 88.4 - 84.4 %   Platelets 240 150 - 400 K/uL   nRBC 0.0 0.0 - 0.2 %  Comprehensive  metabolic panel with GFR   Collection Time: 01/10/25  7:34 PM  Result Value Ref Range   Sodium 142 135 - 145 mmol/L   Potassium 3.6 3.5 - 5.1 mmol/L   Chloride 107 98 - 111 mmol/L   CO2 22 22 - 32 mmol/L   Glucose, Bld 94 70 - 99 mg/dL   BUN 12 8 - 23 mg/dL   Creatinine, Ser 9.05 0.44 - 1.00 mg/dL   Calcium  8.7 (L) 8.9 - 10.3 mg/dL   Total Protein 6.6 6.5 - 8.1 g/dL   Albumin 3.7 3.5 - 5.0 g/dL   AST 15 15 - 41 U/L   ALT 15 0 - 44 U/L   Alkaline Phosphatase 96 38 - 126 U/L   Total Bilirubin 0.3 0.0 - 1.2 mg/dL   GFR, Estimated >39 >39 mL/min   Anion gap 13 5 - 15  Pro Brain natriuretic peptide   Collection Time: 01/10/25  7:34 PM  Result Value Ref Range   Pro Brain Natriuretic Peptide 75.3 <300.0 pg/mL  Troponin T, High Sensitivity   Collection Time: 01/10/25  7:34 PM  Result Value Ref Range   Troponin T High Sensitivity <6 0 - 19 ng/L  Troponin T, High Sensitivity   Collection Time: 01/10/25  9:27 PM  Result Value Ref Range   Troponin T High Sensitivity <6 0 - 19 ng/L  Urinalysis, Routine w reflex microscopic -Urine, Clean Catch   Collection Time: 01/10/25 10:03 PM  Result Value Ref Range   Color, Urine YELLOW YELLOW   APPearance CLEAR CLEAR   Specific Gravity, Urine 1.019 1.005 - 1.030   pH 7.0 5.0 - 8.0   Glucose, UA NEGATIVE NEGATIVE mg/dL   Hgb urine dipstick NEGATIVE NEGATIVE   Bilirubin Urine NEGATIVE NEGATIVE   Ketones, ur 5 (A) NEGATIVE mg/dL   Protein, ur NEGATIVE NEGATIVE mg/dL  Nitrite NEGATIVE NEGATIVE   Leukocytes,Ua NEGATIVE NEGATIVE   DG Chest Port 1 View Result Date: 01/10/2025 EXAM: 1 VIEW XRAY OF THE CHEST 01/10/2025 07:19:52 PM COMPARISON: 01/02/2025 CLINICAL HISTORY: Persistent cough and shortness of breath. FINDINGS: LUNGS AND PLEURA: Lungs are well aerated. No focal pulmonary opacity. No pleural effusion. No pneumothorax. HEART AND MEDIASTINUM: The heart  is within normal limits. Aortic calcifications are seen. BONES AND SOFT TISSUES: No acute  osseous abnormality. IMPRESSION: 1. No acute cardiopulmonary abnormality. Electronically signed by: Oneil Devonshire MD 01/10/2025 07:23 PM EST RP Workstation: [id number]   DG Chest 2 View Result Date: 01/02/2025 EXAM: 2 VIEW(S) XRAY OF THE CHEST 01/02/2025 07:25:00 AM COMPARISON: 12/02/2024 CLINICAL HISTORY: chest pain chest pain chest pain FINDINGS: LUNGS AND PLEURA: No focal pulmonary opacity. No pleural effusion. No pneumothorax. HEART AND MEDIASTINUM: Aortic arch calcifications. No acute abnormality of the cardiac and mediastinal silhouettes. BONES AND SOFT TISSUES: Surgical clips noted in the right upper quadrant of the abdomen. No acute osseous abnormality. IMPRESSION: 1. No acute findings. Electronically signed by: Waddell Calk MD 01/02/2025 07:30 AM EST RP Workstation: [id number]   MM 3D SCREENING MAMMOGRAM BILATERAL BREAST Result Date: 12/23/2024 CLINICAL DATA:  Screening. EXAM: DIGITAL SCREENING BILATERAL MAMMOGRAM WITH TOMOSYNTHESIS AND CAD TECHNIQUE: Bilateral screening digital craniocaudal and mediolateral oblique mammograms were obtained. Bilateral screening digital breast tomosynthesis was performed. The images were evaluated with computer-aided detection. COMPARISON:  Previous exam(s). ACR Breast Density Category b: There are scattered areas of fibroglandular density. FINDINGS: In the right breast, possible asymmetries warrants further evaluation. In the left breast, no findings suspicious for malignancy. IMPRESSION: Further evaluation is suggested for possible asymmetries in the right breast. RECOMMENDATION: Diagnostic mammogram and possibly ultrasound of the right breast. (Code:[id number]) The patient will be contacted regarding the findings, and additional imaging will be scheduled. BI-RADS CATEGORY  0: Incomplete: Need additional imaging evaluation. Electronically Signed   By: Craig Farr M.D.   On: 12/23/2024 08:18     EKG: EKG Interpretation Date/Time:  Saturday January 10 2025 18:29:32  EST Ventricular Rate:  74 PR Interval:  140 QRS Duration:  81 QT Interval:  369 QTC Calculation: 410 R Axis:   80  Text Interpretation: Sinus rhythm Non-specific ST-t changes Confirmed by Bernard Drivers ([id number]) on 01/10/2025 6:46:24 PM  Radiology: [facility] Chest Port 1 View Result Date: 01/10/2025 EXAM: 1 VIEW XRAY OF THE CHEST 01/10/2025 07:19:52 PM COMPARISON: 01/02/2025 CLINICAL HISTORY: Persistent cough and shortness of breath. FINDINGS: LUNGS AND PLEURA: Lungs are well aerated. No focal pulmonary opacity. No pleural effusion. No pneumothorax. HEART AND MEDIASTINUM: The heart  is within normal limits. Aortic calcifications are seen. BONES AND SOFT TISSUES: No acute osseous abnormality. IMPRESSION: 1. No acute cardiopulmonary abnormality. Electronically signed by: Oneil Devonshire MD 01/10/2025 07:23 PM EST RP Workstation: [id number]     Procedures   Medications Ordered in the ED  albuterol  (PROVENTIL ) (2.5 MG/3ML) 0.083% nebulizer solution 5 mg (5 mg Nebulization Given 01/10/25 1939)  ipratropium (ATROVENT ) nebulizer solution 0.5 mg (0.5 mg Nebulization Given 01/10/25 1939)  methylPREDNISolone  sodium succinate (SOLU-MEDROL ) 125 mg/2 mL injection 80 mg (80 mg Intravenous Given 01/10/25 2012)                                    Medical Decision Making Problems Addressed: COPD exacerbation (HCC): acute illness or injury with systemic symptoms that poses a threat to life or bodily functions Viral URI with  cough: acute illness or injury with systemic symptoms that poses a threat to life or bodily functions  Amount and/or Complexity of Data Reviewed External Data Reviewed: notes. Labs: ordered. Decision-making details documented in ED Course. Radiology: ordered and independent interpretation performed. Decision-making details documented in ED Course. ECG/medicine tests: ordered and independent interpretation performed. Decision-making details documented in ED Course.  Risk Prescription drug  management. Decision regarding hospitalization.   Iv ns. Continuous pulse ox and cardiac monitoring. Labs ordered/sent. Imaging ordered.   Differential diagnosis includes uri, covid, flu, pna, copd exacerbation, etc. Dispo decision including potential need for admission considered - will get labs and imaging and reassess.   Reviewed nursing notes and prior charts for additional history. External reports reviewed.   Albuterol  and atrovent  neb. Solumedrol iv.   Cardiac monitor: sinus rhythm, rate 78.  Labs reviewed/interpreted by me - covid and flu neg (recent flu pos). Wbc and hgb normal. Chem normal. Bnp normal. Trop x 2 normal - after prolonged symptoms, felt not c/w acs.   Xrays reviewed/interpreted by me - no pna.   Recheck, no wheezing or increased wob. No chest pain. O2 sats 99%.   Pt currently appears stable for ED d/c.   Rec close pcp f/u.  Return precautions provided.       Final diagnoses:  None    ED Discharge Orders     None          Bernard Drivers, MD 01/10/25 2224  "

## 2025-01-10 NOTE — Discharge Instructions (Addendum)
 It was our pleasure to provide your ER care today - we hope that you feel better.  Take prednisone  as prescribed. Use albuterol  inhaler as need. Avoid any smoking.   Follow up closely with primary care doctor in the coming week.  Return to ER if worse, new symptoms, high fevers, chest pain, increased trouble breathing, or other concern.

## 2025-01-20 ENCOUNTER — Ambulatory Visit (HOSPITAL_COMMUNITY)
Admission: RE | Admit: 2025-01-20 | Discharge: 2025-01-20 | Disposition: A | Source: Ambulatory Visit | Attending: Internal Medicine | Admitting: Internal Medicine

## 2025-01-20 DIAGNOSIS — R928 Other abnormal and inconclusive findings on diagnostic imaging of breast: Secondary | ICD-10-CM

## 2025-02-09 ENCOUNTER — Ambulatory Visit: Admitting: Urology
